# Patient Record
Sex: Female | Born: 1935 | Race: White | Hispanic: No | Marital: Married | Smoking: Former smoker
Health system: Southern US, Community
[De-identification: ages and names within clinical notes are randomized; demographics above are authoritative.]

## PROBLEM LIST (undated history)

## (undated) DIAGNOSIS — R0602 Shortness of breath: Secondary | ICD-10-CM

## (undated) DIAGNOSIS — J189 Pneumonia, unspecified organism: Secondary | ICD-10-CM

## (undated) DIAGNOSIS — C50919 Malignant neoplasm of unspecified site of unspecified female breast: Secondary | ICD-10-CM

## (undated) DIAGNOSIS — K621 Rectal polyp: Secondary | ICD-10-CM

## (undated) DIAGNOSIS — F1021 Alcohol dependence, in remission: Secondary | ICD-10-CM

## (undated) DIAGNOSIS — F39 Unspecified mood [affective] disorder: Secondary | ICD-10-CM

## (undated) DIAGNOSIS — D649 Anemia, unspecified: Secondary | ICD-10-CM

## (undated) DIAGNOSIS — K219 Gastro-esophageal reflux disease without esophagitis: Secondary | ICD-10-CM

## (undated) DIAGNOSIS — G459 Transient cerebral ischemic attack, unspecified: Secondary | ICD-10-CM

## (undated) DIAGNOSIS — K5790 Diverticulosis of intestine, part unspecified, without perforation or abscess without bleeding: Secondary | ICD-10-CM

## (undated) DIAGNOSIS — J449 Chronic obstructive pulmonary disease, unspecified: Secondary | ICD-10-CM

## (undated) DIAGNOSIS — I10 Essential (primary) hypertension: Secondary | ICD-10-CM

## (undated) DIAGNOSIS — M1990 Unspecified osteoarthritis, unspecified site: Secondary | ICD-10-CM

## (undated) DIAGNOSIS — E785 Hyperlipidemia, unspecified: Secondary | ICD-10-CM

## (undated) DIAGNOSIS — R7309 Other abnormal glucose: Secondary | ICD-10-CM

## (undated) HISTORY — DX: Anemia, unspecified: D64.9

## (undated) HISTORY — PX: COLONOSCOPY W/ POLYPECTOMY: SHX1380

## (undated) HISTORY — DX: Rectal polyp: K62.1

## (undated) HISTORY — DX: Diverticulosis of intestine, part unspecified, without perforation or abscess without bleeding: K57.90

## (undated) HISTORY — DX: Gastro-esophageal reflux disease without esophagitis: K21.9

## (undated) HISTORY — DX: Essential (primary) hypertension: I10

## (undated) HISTORY — DX: Alcohol dependence, in remission: F10.21

## (undated) HISTORY — PX: TONSILLECTOMY: SUR1361

## (undated) HISTORY — PX: BREAST BIOPSY: SHX20

## (undated) HISTORY — DX: Unspecified mood (affective) disorder: F39

## (undated) HISTORY — PX: HERNIA REPAIR: SHX51

## (undated) HISTORY — DX: Chronic obstructive pulmonary disease, unspecified: J44.9

## (undated) HISTORY — DX: Hyperlipidemia, unspecified: E78.5

## (undated) HISTORY — DX: Unspecified osteoarthritis, unspecified site: M19.90

## (undated) HISTORY — PX: CATARACT EXTRACTION W/ INTRAOCULAR LENS  IMPLANT, BILATERAL: SHX1307

## (undated) HISTORY — PX: BREAST EXCISIONAL BIOPSY: SUR124

## (undated) HISTORY — PX: CARPAL TUNNEL RELEASE: SHX101

## (undated) HISTORY — DX: Other abnormal glucose: R73.09

---

## 1976-07-10 HISTORY — PX: ABDOMINAL HYSTERECTOMY: SHX81

## 2000-07-10 HISTORY — PX: APPENDECTOMY: SHX54

## 2001-07-10 HISTORY — PX: BREAST EXCISIONAL BIOPSY: SUR124

## 2004-01-28 ENCOUNTER — Encounter: Payer: Self-pay | Admitting: Internal Medicine

## 2004-07-25 ENCOUNTER — Ambulatory Visit: Payer: Self-pay | Admitting: Internal Medicine

## 2004-09-28 ENCOUNTER — Ambulatory Visit (HOSPITAL_COMMUNITY): Admission: RE | Admit: 2004-09-28 | Discharge: 2004-09-28 | Payer: Self-pay | Admitting: Internal Medicine

## 2004-10-24 ENCOUNTER — Ambulatory Visit: Payer: Self-pay | Admitting: Internal Medicine

## 2004-10-31 ENCOUNTER — Ambulatory Visit: Payer: Self-pay | Admitting: Internal Medicine

## 2004-12-06 ENCOUNTER — Ambulatory Visit: Payer: Self-pay | Admitting: Internal Medicine

## 2005-03-14 ENCOUNTER — Ambulatory Visit: Payer: Self-pay | Admitting: Internal Medicine

## 2005-05-13 ENCOUNTER — Ambulatory Visit: Payer: Self-pay | Admitting: Internal Medicine

## 2005-05-24 ENCOUNTER — Ambulatory Visit: Payer: Self-pay | Admitting: Internal Medicine

## 2005-06-07 ENCOUNTER — Ambulatory Visit: Payer: Self-pay | Admitting: Internal Medicine

## 2005-06-24 ENCOUNTER — Ambulatory Visit: Payer: Self-pay | Admitting: Internal Medicine

## 2005-07-14 ENCOUNTER — Ambulatory Visit: Payer: Self-pay | Admitting: Internal Medicine

## 2005-07-20 ENCOUNTER — Ambulatory Visit: Payer: Self-pay | Admitting: Internal Medicine

## 2005-10-04 ENCOUNTER — Ambulatory Visit (HOSPITAL_COMMUNITY): Admission: RE | Admit: 2005-10-04 | Discharge: 2005-10-04 | Payer: Self-pay | Admitting: Internal Medicine

## 2005-11-17 ENCOUNTER — Ambulatory Visit: Payer: Self-pay | Admitting: Internal Medicine

## 2005-12-06 ENCOUNTER — Ambulatory Visit: Payer: Self-pay | Admitting: Internal Medicine

## 2006-01-01 ENCOUNTER — Ambulatory Visit: Payer: Self-pay | Admitting: Internal Medicine

## 2006-05-03 ENCOUNTER — Ambulatory Visit: Payer: Self-pay | Admitting: Internal Medicine

## 2006-07-10 HISTORY — PX: HAMMER TOE SURGERY: SHX385

## 2006-08-10 LAB — HM COLONOSCOPY

## 2006-10-10 ENCOUNTER — Ambulatory Visit (HOSPITAL_COMMUNITY): Admission: RE | Admit: 2006-10-10 | Discharge: 2006-10-10 | Payer: Self-pay | Admitting: Internal Medicine

## 2006-11-02 ENCOUNTER — Ambulatory Visit: Payer: Self-pay | Admitting: Internal Medicine

## 2006-11-02 LAB — CONVERTED CEMR LAB
ALT: 24 units/L (ref 0–40)
AST: 28 units/L (ref 0–37)
Albumin: 3.8 g/dL (ref 3.5–5.2)
Alkaline Phosphatase: 70 units/L (ref 39–117)
BUN: 15 mg/dL (ref 6–23)
Basophils Absolute: 0 10*3/uL (ref 0.0–0.1)
Basophils Relative: 0.6 % (ref 0.0–1.0)
Bilirubin, Direct: 0.1 mg/dL (ref 0.0–0.3)
CO2: 33 meq/L — ABNORMAL HIGH (ref 19–32)
Calcium: 9.3 mg/dL (ref 8.4–10.5)
Chloride: 105 meq/L (ref 96–112)
Cholesterol: 186 mg/dL (ref 0–200)
Creatinine, Ser: 0.7 mg/dL (ref 0.4–1.2)
Direct LDL: 101 mg/dL
Eosinophils Absolute: 0.2 10*3/uL (ref 0.0–0.6)
Eosinophils Relative: 3.6 % (ref 0.0–5.0)
GFR calc Af Amer: 107 mL/min
GFR calc non Af Amer: 88 mL/min
Glucose, Bld: 127 mg/dL — ABNORMAL HIGH (ref 70–99)
HCT: 39.5 % (ref 36.0–46.0)
HDL: 46.5 mg/dL (ref >39.0)
Hemoglobin: 13.6 g/dL (ref 12.0–15.0)
Lymphocytes Relative: 24.4 % (ref 12.0–46.0)
MCHC: 34.4 g/dL (ref 30.0–36.0)
MCV: 90.2 fL (ref 78.0–100.0)
Monocytes Absolute: 0.7 10*3/uL (ref 0.2–0.7)
Monocytes Relative: 10.7 % (ref 3.0–11.0)
Neutro Abs: 4.3 10*3/uL (ref 1.4–7.7)
Neutrophils Relative %: 60.7 % (ref 43.0–77.0)
Platelets: 287 10*3/uL (ref 150–400)
Potassium: 3.8 meq/L (ref 3.5–5.1)
RBC: 4.38 M/uL (ref 3.87–5.11)
RDW: 13.1 % (ref 11.5–14.6)
Sodium: 144 meq/L (ref 135–145)
TSH: 1.06 microintl units/mL (ref 0.35–5.50)
Total Bilirubin: 0.8 mg/dL (ref 0.3–1.2)
Total CHOL/HDL Ratio: 4
Total Protein: 6.4 g/dL (ref 6.0–8.3)
Triglycerides: 260 mg/dL (ref 0–149)
VLDL: 52 mg/dL — ABNORMAL HIGH (ref 0–40)
WBC: 6.9 10*3/uL (ref 4.5–10.5)

## 2006-11-07 ENCOUNTER — Encounter: Admission: RE | Admit: 2006-11-07 | Discharge: 2006-11-07 | Payer: Self-pay | Admitting: Internal Medicine

## 2006-12-14 ENCOUNTER — Ambulatory Visit: Payer: Self-pay | Admitting: Internal Medicine

## 2006-12-17 DIAGNOSIS — J441 Chronic obstructive pulmonary disease with (acute) exacerbation: Secondary | ICD-10-CM | POA: Insufficient documentation

## 2006-12-17 DIAGNOSIS — J449 Chronic obstructive pulmonary disease, unspecified: Secondary | ICD-10-CM

## 2006-12-17 DIAGNOSIS — I10 Essential (primary) hypertension: Secondary | ICD-10-CM | POA: Insufficient documentation

## 2006-12-17 DIAGNOSIS — E785 Hyperlipidemia, unspecified: Secondary | ICD-10-CM

## 2006-12-17 DIAGNOSIS — J4489 Other specified chronic obstructive pulmonary disease: Secondary | ICD-10-CM

## 2006-12-17 HISTORY — DX: Hyperlipidemia, unspecified: E78.5

## 2006-12-17 HISTORY — DX: Essential (primary) hypertension: I10

## 2006-12-17 HISTORY — DX: Other specified chronic obstructive pulmonary disease: J44.89

## 2006-12-17 HISTORY — DX: Chronic obstructive pulmonary disease, unspecified: J44.9

## 2007-02-26 ENCOUNTER — Telehealth (INDEPENDENT_AMBULATORY_CARE_PROVIDER_SITE_OTHER): Payer: Self-pay | Admitting: *Deleted

## 2007-03-06 ENCOUNTER — Ambulatory Visit: Payer: Self-pay | Admitting: Internal Medicine

## 2007-03-13 ENCOUNTER — Ambulatory Visit: Payer: Self-pay | Admitting: Internal Medicine

## 2007-03-27 ENCOUNTER — Encounter: Payer: Self-pay | Admitting: Internal Medicine

## 2007-03-27 ENCOUNTER — Ambulatory Visit: Payer: Self-pay | Admitting: Internal Medicine

## 2007-03-27 DIAGNOSIS — K5790 Diverticulosis of intestine, part unspecified, without perforation or abscess without bleeding: Secondary | ICD-10-CM

## 2007-03-27 DIAGNOSIS — K621 Rectal polyp: Secondary | ICD-10-CM

## 2007-03-27 HISTORY — DX: Diverticulosis of intestine, part unspecified, without perforation or abscess without bleeding: K57.90

## 2007-03-27 HISTORY — DX: Rectal polyp: K62.1

## 2007-03-29 ENCOUNTER — Telehealth: Payer: Self-pay | Admitting: *Deleted

## 2007-05-01 ENCOUNTER — Ambulatory Visit: Payer: Self-pay | Admitting: Internal Medicine

## 2007-05-01 DIAGNOSIS — K219 Gastro-esophageal reflux disease without esophagitis: Secondary | ICD-10-CM

## 2007-05-01 HISTORY — DX: Gastro-esophageal reflux disease without esophagitis: K21.9

## 2007-05-02 LAB — CONVERTED CEMR LAB
ALT: 24 units/L (ref 0–35)
AST: 23 units/L (ref 0–37)
Albumin: 3.6 g/dL (ref 3.5–5.2)
Alkaline Phosphatase: 78 units/L (ref 39–117)
BUN: 14 mg/dL (ref 6–23)
Bilirubin, Direct: 0.1 mg/dL (ref 0.0–0.3)
CO2: 28 meq/L (ref 19–32)
Calcium: 9.3 mg/dL (ref 8.4–10.5)
Chloride: 106 meq/L (ref 96–112)
Cholesterol: 191 mg/dL (ref 0–200)
Creatinine, Ser: 0.6 mg/dL (ref 0.4–1.2)
Direct LDL: 107.7 mg/dL
GFR calc Af Amer: 127 mL/min
GFR calc non Af Amer: 105 mL/min
Glucose, Bld: 127 mg/dL — ABNORMAL HIGH (ref 70–99)
HDL: 41.9 mg/dL (ref >39.0)
Potassium: 3.6 meq/L (ref 3.5–5.1)
Sodium: 143 meq/L (ref 135–145)
Total Bilirubin: 0.6 mg/dL (ref 0.3–1.2)
Total CHOL/HDL Ratio: 4.6
Total Protein: 5.9 g/dL — ABNORMAL LOW (ref 6.0–8.3)
Triglycerides: 240 mg/dL (ref 0–149)
VLDL: 48 mg/dL — ABNORMAL HIGH (ref 0–40)

## 2007-05-04 ENCOUNTER — Encounter: Admission: RE | Admit: 2007-05-04 | Discharge: 2007-05-04 | Payer: Self-pay | Admitting: Internal Medicine

## 2007-05-07 ENCOUNTER — Ambulatory Visit: Payer: Self-pay | Admitting: Internal Medicine

## 2007-05-15 ENCOUNTER — Ambulatory Visit: Payer: Self-pay | Admitting: Pulmonary Disease

## 2007-05-15 ENCOUNTER — Encounter: Payer: Self-pay | Admitting: Internal Medicine

## 2007-05-30 ENCOUNTER — Ambulatory Visit: Payer: Self-pay | Admitting: Internal Medicine

## 2007-06-04 ENCOUNTER — Telehealth: Payer: Self-pay | Admitting: Internal Medicine

## 2007-06-25 ENCOUNTER — Telehealth: Payer: Self-pay | Admitting: Internal Medicine

## 2007-07-12 ENCOUNTER — Ambulatory Visit: Payer: Self-pay | Admitting: Family Medicine

## 2007-08-27 ENCOUNTER — Encounter: Payer: Self-pay | Admitting: Internal Medicine

## 2007-08-27 ENCOUNTER — Ambulatory Visit: Payer: Self-pay | Admitting: Internal Medicine

## 2007-08-27 DIAGNOSIS — F1021 Alcohol dependence, in remission: Secondary | ICD-10-CM

## 2007-08-27 DIAGNOSIS — R7309 Other abnormal glucose: Secondary | ICD-10-CM | POA: Insufficient documentation

## 2007-08-27 HISTORY — DX: Alcohol dependence, in remission: F10.21

## 2007-08-27 HISTORY — DX: Other abnormal glucose: R73.09

## 2007-08-29 LAB — CONVERTED CEMR LAB
BUN: 11 mg/dL (ref 6–23)
Calcium: 9.5 mg/dL (ref 8.4–10.5)
Creatinine, Ser: 0.8 mg/dL (ref 0.4–1.2)
GFR calc non Af Amer: 75 mL/min
Glucose, Bld: 105 mg/dL — ABNORMAL HIGH (ref 70–99)
Hgb A1c MFr Bld: 6.2 % — ABNORMAL HIGH (ref 4.6–6.0)

## 2007-10-11 ENCOUNTER — Ambulatory Visit (HOSPITAL_COMMUNITY): Admission: RE | Admit: 2007-10-11 | Discharge: 2007-10-11 | Payer: Self-pay | Admitting: Internal Medicine

## 2007-11-20 ENCOUNTER — Ambulatory Visit: Payer: Self-pay | Admitting: Internal Medicine

## 2008-01-06 ENCOUNTER — Ambulatory Visit: Payer: Self-pay | Admitting: Internal Medicine

## 2008-02-19 ENCOUNTER — Ambulatory Visit: Payer: Self-pay | Admitting: Internal Medicine

## 2008-02-19 LAB — CONVERTED CEMR LAB
Albumin: 3.7 g/dL (ref 3.5–5.2)
Calcium: 9.2 mg/dL (ref 8.4–10.5)
Chloride: 105 meq/L (ref 96–112)
Cholesterol: 190 mg/dL (ref 0–200)
GFR calc Af Amer: 106 mL/min
Glucose, Bld: 87 mg/dL (ref 70–99)
Potassium: 4.2 meq/L (ref 3.5–5.1)
Sodium: 144 meq/L (ref 135–145)
Total Bilirubin: 0.7 mg/dL (ref 0.3–1.2)
Total CHOL/HDL Ratio: 4.5
Total Protein: 6.1 g/dL (ref 6.0–8.3)
VLDL: 34 mg/dL (ref 0–40)

## 2008-02-26 ENCOUNTER — Ambulatory Visit: Payer: Self-pay | Admitting: Internal Medicine

## 2008-04-06 ENCOUNTER — Ambulatory Visit: Payer: Self-pay | Admitting: Internal Medicine

## 2008-06-16 ENCOUNTER — Ambulatory Visit: Payer: Self-pay | Admitting: Internal Medicine

## 2008-06-16 LAB — CONVERTED CEMR LAB
ALT: 21 units/L (ref 0–35)
AST: 19 units/L (ref 0–37)
Albumin: 3.5 g/dL (ref 3.5–5.2)
Alkaline Phosphatase: 65 units/L (ref 39–117)
BUN: 16 mg/dL (ref 6–23)
Bilirubin, Direct: 0.1 mg/dL (ref 0.0–0.3)
CO2: 29 meq/L (ref 19–32)
Calcium: 9 mg/dL (ref 8.4–10.5)
Cholesterol: 198 mg/dL (ref 0–200)
Creatinine, Ser: 0.7 mg/dL (ref 0.4–1.2)
GFR calc non Af Amer: 88 mL/min
HDL: 43.8 mg/dL (ref >39.0)
Potassium: 3.8 meq/L (ref 3.5–5.1)
Sodium: 141 meq/L (ref 135–145)
Total Bilirubin: 0.6 mg/dL (ref 0.3–1.2)

## 2008-06-23 ENCOUNTER — Ambulatory Visit: Payer: Self-pay | Admitting: Internal Medicine

## 2008-07-14 ENCOUNTER — Telehealth: Payer: Self-pay | Admitting: Internal Medicine

## 2008-10-12 ENCOUNTER — Ambulatory Visit (HOSPITAL_COMMUNITY): Admission: RE | Admit: 2008-10-12 | Discharge: 2008-10-12 | Payer: Self-pay | Admitting: Internal Medicine

## 2008-10-19 ENCOUNTER — Ambulatory Visit: Payer: Self-pay | Admitting: Internal Medicine

## 2008-10-19 LAB — CONVERTED CEMR LAB
ALT: 29 units/L (ref 0–35)
Albumin: 3.6 g/dL (ref 3.5–5.2)
BUN: 18 mg/dL (ref 6–23)
Bilirubin, Direct: 0.1 mg/dL (ref 0.0–0.3)
Calcium: 9.2 mg/dL (ref 8.4–10.5)
Creatinine, Ser: 0.8 mg/dL (ref 0.4–1.2)
Hgb A1c MFr Bld: 6.1 % (ref 4.6–6.5)
LDL Cholesterol: 109 mg/dL — ABNORMAL HIGH (ref 0–99)
Total Bilirubin: 0.7 mg/dL (ref 0.3–1.2)
Total CHOL/HDL Ratio: 4
Total Protein: 6.3 g/dL (ref 6.0–8.3)
VLDL: 37.6 mg/dL (ref 0.0–40.0)

## 2008-10-26 ENCOUNTER — Ambulatory Visit: Payer: Self-pay | Admitting: Internal Medicine

## 2009-02-01 ENCOUNTER — Ambulatory Visit: Payer: Self-pay | Admitting: Internal Medicine

## 2009-02-02 LAB — CONVERTED CEMR LAB
ALT: 26 units/L (ref 0–35)
AST: 28 units/L (ref 0–37)
Albumin: 3.7 g/dL (ref 3.5–5.2)
Alkaline Phosphatase: 59 units/L (ref 39–117)
BUN: 18 mg/dL (ref 6–23)
Bilirubin, Direct: 0 mg/dL (ref 0.0–0.3)
CO2: 30 meq/L (ref 19–32)
Calcium: 9.7 mg/dL (ref 8.4–10.5)
Chloride: 104 meq/L (ref 96–112)
Cholesterol: 198 mg/dL (ref 0–200)
Creatinine, Ser: 0.8 mg/dL (ref 0.4–1.2)
GFR calc non Af Amer: 74.94 mL/min (ref >60)
Glucose, Bld: 119 mg/dL — ABNORMAL HIGH (ref 70–99)
HDL: 44.7 mg/dL (ref >39.00)
Hgb A1c MFr Bld: 6 % (ref 4.6–6.5)
LDL Cholesterol: 123 mg/dL — ABNORMAL HIGH (ref 0–99)
Potassium: 3.6 meq/L (ref 3.5–5.1)
Sodium: 143 meq/L (ref 135–145)
TSH: 1.78 microintl units/mL (ref 0.35–5.50)
Total Bilirubin: 0.9 mg/dL (ref 0.3–1.2)
Total CHOL/HDL Ratio: 4
Total Protein: 6.5 g/dL (ref 6.0–8.3)
Triglycerides: 153 mg/dL — ABNORMAL HIGH (ref 0.0–149.0)
VLDL: 30.6 mg/dL (ref 0.0–40.0)

## 2009-04-21 ENCOUNTER — Ambulatory Visit: Payer: Self-pay | Admitting: Internal Medicine

## 2009-05-26 ENCOUNTER — Ambulatory Visit: Payer: Self-pay | Admitting: Internal Medicine

## 2009-06-09 ENCOUNTER — Telehealth: Payer: Self-pay | Admitting: Internal Medicine

## 2009-06-23 ENCOUNTER — Encounter: Admission: RE | Admit: 2009-06-23 | Discharge: 2009-07-08 | Payer: Self-pay | Admitting: Orthopedic Surgery

## 2009-07-12 ENCOUNTER — Encounter: Admission: RE | Admit: 2009-07-12 | Discharge: 2009-07-22 | Payer: Self-pay | Admitting: Orthopedic Surgery

## 2009-07-26 ENCOUNTER — Ambulatory Visit: Payer: Self-pay | Admitting: Internal Medicine

## 2009-08-10 ENCOUNTER — Telehealth: Payer: Self-pay | Admitting: Internal Medicine

## 2009-08-16 ENCOUNTER — Encounter: Payer: Self-pay | Admitting: Internal Medicine

## 2009-08-19 ENCOUNTER — Ambulatory Visit: Payer: Self-pay | Admitting: Internal Medicine

## 2009-08-19 LAB — CONVERTED CEMR LAB
AST: 28 units/L (ref 0–37)
Albumin: 3.8 g/dL (ref 3.5–5.2)
BUN: 22 mg/dL (ref 6–23)
Bilirubin, Direct: 0.1 mg/dL (ref 0.0–0.3)
Chloride: 106 meq/L (ref 96–112)
Hgb A1c MFr Bld: 6.2 % (ref 4.6–6.5)
Potassium: 3.7 meq/L (ref 3.5–5.1)
Sodium: 142 meq/L (ref 135–145)
Triglycerides: 202 mg/dL — ABNORMAL HIGH (ref 0.0–149.0)
VLDL: 40.4 mg/dL — ABNORMAL HIGH (ref 0.0–40.0)

## 2009-08-26 ENCOUNTER — Ambulatory Visit: Payer: Self-pay | Admitting: Internal Medicine

## 2009-09-02 ENCOUNTER — Encounter: Payer: Self-pay | Admitting: Internal Medicine

## 2009-10-13 ENCOUNTER — Ambulatory Visit (HOSPITAL_COMMUNITY): Admission: RE | Admit: 2009-10-13 | Discharge: 2009-10-13 | Payer: Self-pay | Admitting: Internal Medicine

## 2009-10-13 LAB — HM MAMMOGRAPHY: HM Mammogram: NORMAL

## 2010-02-16 ENCOUNTER — Ambulatory Visit: Payer: Self-pay | Admitting: Internal Medicine

## 2010-02-16 LAB — CONVERTED CEMR LAB
AST: 24 units/L (ref 0–37)
Basophils Absolute: 0.1 10*3/uL (ref 0.0–0.1)
Basophils Relative: 0.7 % (ref 0.0–3.0)
Bilirubin, Direct: 0.1 mg/dL (ref 0.0–0.3)
CO2: 34 meq/L — ABNORMAL HIGH (ref 19–32)
Calcium: 10 mg/dL (ref 8.4–10.5)
Cholesterol: 187 mg/dL (ref 0–200)
Eosinophils Relative: 4.6 % (ref 0.0–5.0)
Glucose, Bld: 90 mg/dL (ref 70–99)
HDL: 41.6 mg/dL (ref >39.00)
Lymphs Abs: 2.5 10*3/uL (ref 0.7–4.0)
MCV: 92.8 fL (ref 78.0–100.0)
Monocytes Absolute: 0.7 10*3/uL (ref 0.1–1.0)
Potassium: 4.2 meq/L (ref 3.5–5.1)
RBC: 4.19 M/uL (ref 3.87–5.11)
RDW: 14.1 % (ref 11.5–14.6)
Sodium: 144 meq/L (ref 135–145)
Triglycerides: 248 mg/dL — ABNORMAL HIGH (ref 0.0–149.0)
VLDL: 49.6 mg/dL — ABNORMAL HIGH (ref 0.0–40.0)

## 2010-02-23 ENCOUNTER — Ambulatory Visit: Payer: Self-pay | Admitting: Internal Medicine

## 2010-02-23 DIAGNOSIS — M129 Arthropathy, unspecified: Secondary | ICD-10-CM | POA: Insufficient documentation

## 2010-02-24 ENCOUNTER — Ambulatory Visit: Payer: Self-pay | Admitting: Internal Medicine

## 2010-02-24 ENCOUNTER — Encounter: Payer: Self-pay | Admitting: Internal Medicine

## 2010-03-24 ENCOUNTER — Ambulatory Visit: Payer: Self-pay | Admitting: Internal Medicine

## 2010-07-15 ENCOUNTER — Telehealth: Payer: Self-pay | Admitting: Internal Medicine

## 2010-07-26 ENCOUNTER — Encounter: Payer: Self-pay | Admitting: Internal Medicine

## 2010-08-06 ENCOUNTER — Encounter: Payer: Self-pay | Admitting: *Deleted

## 2010-08-06 ENCOUNTER — Encounter: Payer: Self-pay | Admitting: Internal Medicine

## 2010-08-09 NOTE — Op Note (Signed)
Summary: Cataract Removal Left Eye  Cataract Removal Left Eye   Imported By: Maryln Gottron 09/06/2009 09:07:55  _____________________________________________________________________  External Attachment:    Type:   Image     Comment:   External Document

## 2010-08-09 NOTE — Assessment & Plan Note (Signed)
Summary: cough/chest congestion/cjr/rsc/cjr   Vital Signs:  Patient profile:   76 year old female Temp:     97.9 degrees F Pulse rate:   60 / minute Resp:     12 per minute BP sitting:   118 / 74  (left arm)  Vitals Entered By: Gladis Riffle, RN (July 26, 2009 8:43 AM) CC: URI symptoms   CC:  URI symptoms.  History of Present Illness:  URI Symptoms      This is a 76 year old woman who presents with URI symptoms.  The patient denies nasal congestion and clear nasal discharge.  The patient denies fever.  The patient also reports itchy watery eyes and itchy throat.  The patient denies the following risk factors for Strep sinusitis: unilateral facial pain.  2 weeks of sxs---sinus congestion--has some cough--mostly nonproductive  All other systems reviewed and were negative   Preventive Screening-Counseling & Management  Alcohol-Tobacco     Smoking Status: quit > 6 months     Year Started: 1956     Year Quit: 1986  Current Problems (verified): 1)  Hyperglycemia  (ICD-790.29) 2)  Acute Alcoholic Intoxication in Remission  (ICD-303.03) 3)  Gerd  (ICD-530.81) 4)  Depression  (ICD-311) 5)  Anxiety  (ICD-300.00) 6)  Hypertension  (ICD-401.9) 7)  Hyperlipidemia  (ICD-272.4) 8)  COPD  (ICD-496)  Current Medications (verified): 1)  Diovan Hct 320-25 Mg Tabs (Valsartan-Hydrochlorothiazide) .... Take 1  Tablet By Mouth Once A Day 2)  Simvastatin 20 Mg Tabs (Simvastatin) .... Take 1 Tablet By Mouth Once A Day 3)  Multivitamins   Caps (Multiple Vitamin) .... One By Mouth Daily 4)  Caltrate 600+d 600-400 Mg-Unit  Tabs (Calcium Carbonate-Vitamin D) .... Daily 5)  Diclofenac Sodium 50 Mg  Tbec (Diclofenac Sodium) .... Take 1 Tablet By Mouth Two Times A Day 6)  Misoprostol 200 Mcg  Tabs (Misoprostol) .... One By Mouth Bid 7)  Vitamin D 1000 Unit Caps (Cholecalciferol) .... Once Daily  Allergies (verified): No Known Drug Allergies  Comments:  Nurse/Medical Assistant: c/o cough and  chest congestion x 2-3 weeks, had it over Thanksgiving and got better but has returned--has tried robitussin DM with no relief  The patient's medications and allergies were reviewed with the patient and were updated in the Medication and Allergy Lists. Gladis Riffle, RN (July 26, 2009 8:45 AM)  Past History:  Past Medical History: Last updated: 05/01/2007 COPD/chronic bronchitis Hyperlipidemia Hypertension mood disorder recovering alcoholic Anxiety Depression GERD  Past Surgical History: Last updated: 12/17/2006 breast biopsy-neg Appendectomy--2002 Hysterectomy--fibroma--1978 Hammer toe L foot--2008  Family History: Last updated: 05/26/2009 mother throat cancer father MI 97 yo brother sarcoma brother---IPF (62 yo)  Social History: Last updated: 03/06/2007 Married Former Smoker Alcohol use-no Regular exercise-no  Risk Factors: Exercise: no (03/06/2007)  Risk Factors: Smoking Status: quit > 6 months (07/26/2009)  Review of Systems       All other systems reviewed and were negative   Physical Exam  General:  Well-developed,well-nourished,in no acute distress; alert,appropriate and cooperative throughout examination Head:  normocephalic and atraumatic.   Eyes:  pupils equal and pupils round.   Ears:  R ear normal and L ear normal.   Nose:  no external deformity and no external erythema.   Neck:  No deformities, masses, or tenderness noted. Chest Wall:  No deformities, masses, or tenderness noted. Lungs:  normal respiratory effort and no intercostal retractions.     Impression & Recommendations:  Problem # 1:  BRONCHITIS-ACUTE (ICD-466.0)  emperic abx side effects discussed robitussin dm  Her updated medication list for this problem includes:    Doxycycline Hyclate 100 Mg Caps (Doxycycline hyclate) .Marland Kitchen... Take 1 tab twice a day  Complete Medication List: 1)  Diovan Hct 320-25 Mg Tabs (Valsartan-hydrochlorothiazide) .... Take 1  tablet by mouth once a  day 2)  Simvastatin 20 Mg Tabs (Simvastatin) .... Take 1 tablet by mouth once a day 3)  Multivitamins Caps (Multiple vitamin) .... One by mouth daily 4)  Caltrate 600+d 600-400 Mg-unit Tabs (Calcium carbonate-vitamin d) .... Daily 5)  Diclofenac Sodium 50 Mg Tbec (Diclofenac sodium) .... Take 1 tablet by mouth two times a day 6)  Misoprostol 200 Mcg Tabs (Misoprostol) .... One by mouth bid 7)  Vitamin D 1000 Unit Caps (Cholecalciferol) .... Once daily 8)  Doxycycline Hyclate 100 Mg Caps (Doxycycline hyclate) .... Take 1 tab twice a day Prescriptions: DOXYCYCLINE HYCLATE 100 MG CAPS (DOXYCYCLINE HYCLATE) Take 1 tab twice a day  #20 x 0   Entered and Authorized by:   Birdie Sons MD   Signed by:   Birdie Sons MD on 07/26/2009   Method used:   Electronically to        [facility] [phone]* (retail)       [address]       [place], [place]  [address]       Ph: [phone] or [phone]       Fax: [phone]   RxID:   [id number]

## 2010-08-09 NOTE — Assessment & Plan Note (Signed)
Summary: flu shot//ccm   Nurse Visit   Review of Systems       Flu Vaccine Consent Questions     Do you have a history of severe allergic reactions to this vaccine? no    Any prior history of allergic reactions to egg and/or gelatin? no    Do you have a sensitivity to the preservative Thimersol? no    Do you have a past history of Guillan-Barre Syndrome? no    Do you currently have an acute febrile illness? no    Have you ever had a severe reaction to latex? no    Vaccine information given and explained to patient? yes    Are you currently pregnant? no    Lot Number:[id number]   Exp Date:01/07/2011   Site Given  Left Deltoid IM Josph Macho RMA  March 24, 2010 2:16 PM    Allergies: No Known Drug Allergies  Orders Added: 1)  Flu Vaccine 68yrs + MEDICARE PATIENTS [Q2039] 2)  Administration Flu vaccine - MCR [G0008]

## 2010-08-09 NOTE — Therapy (Signed)
Summary: Audiological Evaluation/[facility]   Audiological Evaluation/[facility]   Imported By: Maryln Gottron 09/08/2009 14:00:43  _____________________________________________________________________  External Attachment:    Type:   Image     Comment:   External Document

## 2010-08-09 NOTE — Miscellaneous (Signed)
Summary: BONE DENSITY  Clinical Lists Changes  Orders: Added new Test order of T-Bone Densitometry (77080) - Signed Added new Test order of T-Lumbar Vertebral Assessment (77082) - Signed 

## 2010-08-09 NOTE — Assessment & Plan Note (Signed)
Summary: 6 month rov/njr   Vital Signs:  Patient profile:   76 year old female Menstrual status:  hysterectomy Height:      65.5 inches Weight:      203 pounds Temp:     97.9 degrees F oral Pulse rate:   68 / minute Pulse rhythm:   regular Resp:     12 per minute BP sitting:   152 / 78  (left arm) Cuff size:   regular  Vitals Entered By: Gladis Riffle, RN (February 23, 2010 7:55 AM)  Serial Vital Signs/Assessments:  Time      Position  BP       Pulse  Resp  Temp     By                     130/74                         Birdie Sons MD  CC: 6 month rov, labs done Is Patient Diabetic? No   CC:  6 month rov and labs done.  History of Present Illness:  Follow-Up Visit      This is a 76 year old woman who presents for Follow-up visit.  The patient denies chest pain and palpitations.  Since the last visit the patient notes no new problems or concerns.  The patient reports taking meds as prescribed and monitoring BP.  When questioned about possible medication side effects, the patient notes none.    home bps 120-130/60s  All other systems reviewed and were negative   Preventive Screening-Counseling & Management  Alcohol-Tobacco     Smoking Status: quit > 6 months     Year Started: 1954     Year Quit: 1986  Current Problems (verified): 1)  Hyperglycemia  (ICD-790.29) 2)  Acute Alcoholic Intoxication in Remission  (ICD-303.03) 3)  Gerd  (ICD-530.81) 4)  Depression  (ICD-311) 5)  Anxiety  (ICD-300.00) 6)  Hypertension  (ICD-401.9) 7)  Hyperlipidemia  (ICD-272.4) 8)  COPD  (ICD-496)  Current Medications (verified): 1)  Losartan Potassium-Hctz 100-25 Mg Tabs (Losartan Potassium-Hctz) .... Take 1 Tablet By Mouth Once A Day 2)  Simvastatin 20 Mg Tabs (Simvastatin) .... Take 1 Tablet By Mouth Once A Day 3)  Multivitamins   Caps (Multiple Vitamin) .... One By Mouth Daily 4)  Caltrate 600+d 600-400 Mg-Unit  Tabs (Calcium Carbonate-Vitamin D) .... Daily 5)  Diclofenac Sodium 50  Mg  Tbec (Diclofenac Sodium) .... Take 1 Tablet By Mouth Two Times A Day 6)  Misoprostol 200 Mcg  Tabs (Misoprostol) .... One By Mouth Bid 7)  Vitamin D 1000 Unit Caps (Cholecalciferol) .... Once Daily 8)  Claritin 10 Mg Tabs (Loratadine) .... Take 1 Tablet By Mouth Once A Day  Allergies (verified): No Known Drug Allergies  Past History:  Past Medical History: Last updated: 05/01/2007 COPD/chronic bronchitis Hyperlipidemia Hypertension mood disorder recovering alcoholic Anxiety Depression GERD  Past Surgical History: Last updated: 12/17/2006 breast biopsy-neg Appendectomy--2002 Hysterectomy--fibroma--1978 Hammer toe L foot--2008  Family History: Last updated: 05/26/2009 mother throat cancer father MI 82 yo brother sarcoma brother---IPF (30 yo)  Social History: Last updated: 03/06/2007 Married Former Smoker Alcohol use-no Regular exercise-no  Risk Factors: Exercise: no (03/06/2007)  Risk Factors: Smoking Status: quit > 6 months (02/23/2010)  Physical Exam  General:  Well-developed,well-nourished,in no acute distress; alert,appropriate and cooperative throughout examination Head:  normocephalic and atraumatic.   Eyes:  pupils equal and pupils round.  Ears:  R ear normal and L ear normal.   Neck:  No deformities, masses, or tenderness noted. Chest Wall:  No deformities, masses, or tenderness noted. Lungs:  normal respiratory effort and no intercostal retractions.   Heart:  normal rate and regular rhythm.   Abdomen:  Bowel sounds positive,abdomen soft and non-tender without masses, organomegaly or hernias noted.  overweight Msk:  No deformity or scoliosis noted of thoracic or lumbar spine.   Neurologic:  cranial nerves II-XII intact and gait normal.     Impression & Recommendations:  Problem # 1:  GERD (ICD-530.81) rarely has sxs no new meds Her updated medication list for this problem includes:    Misoprostol 200 Mcg Tabs (Misoprostol) ..... One by  mouth bid  Problem # 2:  HYPERTENSION (ICD-401.9) note serial assessment and home bps continue current medications  Her updated medication list for this problem includes:    Losartan Potassium-hctz 100-25 Mg Tabs (Losartan potassium-hctz) .Marland Kitchen... Take 1 tablet by mouth once a day  BP today: 152/78 Prior BP: 130/82 (08/26/2009)  Labs Reviewed: K+: 4.2 (02/16/2010) Creat: : 0.8 (02/16/2010)   Chol: 187 (02/16/2010)   HDL: 41.60 (02/16/2010)   LDL: 123 (02/01/2009)   TG: 248.0 (02/16/2010)  Problem # 3:  ARTHRITIS (ICD-716.90) tolerating NSAID  Problem # 4:  HYPERGLYCEMIA (ICD-790.29) see labs reviewed  Problem # 5:  HYPERLIPIDEMIA (ICD-272.4) controlled continue current medications  Her updated medication list for this problem includes:    Simvastatin 20 Mg Tabs (Simvastatin) .Marland Kitchen... Take 1 tablet by mouth once a day  Labs Reviewed: SGOT: 24 (02/16/2010)   SGPT: 24 (02/16/2010)   HDL:41.60 (02/16/2010), 50.40 (08/19/2009)  LDL:123 (02/01/2009), 109 (10/19/2008)  Chol:187 (02/16/2010), 184 (08/19/2009)  Trig:248.0 (02/16/2010), 202.0 (08/19/2009)  Complete Medication List: 1)  Losartan Potassium-hctz 100-25 Mg Tabs (Losartan potassium-hctz) .... Take 1 tablet by mouth once a day 2)  Simvastatin 20 Mg Tabs (Simvastatin) .... Take 1 tablet by mouth once a day 3)  Multivitamins Caps (Multiple vitamin) .... One by mouth daily 4)  Caltrate 600+d 600-400 Mg-unit Tabs (Calcium carbonate-vitamin d) .... Daily 5)  Diclofenac Sodium 50 Mg Tbec (Diclofenac sodium) .... Take 1 tablet by mouth two times a day 6)  Misoprostol 200 Mcg Tabs (Misoprostol) .... One by mouth bid 7)  Vitamin D 1000 Unit Caps (Cholecalciferol) .... Once daily 8)  Claritin 10 Mg Tabs (Loratadine) .... Take 1 tablet by mouth once a day  Contraindications/Deferment of Procedures/Staging:    Test/Procedure: PAP Smear    Reason for deferment: not indicated   Preventive Care Screening  Mammogram:    Date:   10/13/2009    Next Due:  10/2010    Results:  normal    Patient Instructions: 1)  Please schedule a follow-up appointment in 6 months. 2)  SChedule DEXA Prescriptions: MISOPROSTOL 200 MCG  TABS (MISOPROSTOL) one by mouth bid  #180 x 3   Entered and Authorized by:   Birdie Sons MD   Signed by:   Birdie Sons MD on 02/23/2010   Method used:   Faxed to ...       [facility] (mail-order)             , [place]         Ph: [phone]       Fax: [phone]   RxID:   [id number] DICLOFENAC SODIUM 50 MG  TBEC (DICLOFENAC SODIUM) Take 1 tablet by mouth two times a day  #180 x 3   Entered and  Authorized by:   Birdie Sons MD   Signed by:   Birdie Sons MD on 02/23/2010   Method used:   Faxed to ...       [facility] (mail-order)             , [place]         Ph: [phone]       Fax: [phone]   RxID:   [id number] SIMVASTATIN 20 MG TABS (SIMVASTATIN) Take 1 tablet by mouth once a day  #90 x 3   Entered and Authorized by:   Birdie Sons MD   Signed by:   Birdie Sons MD on 02/23/2010   Method used:   Faxed to ...       [facility] (mail-order)             , [place]         Ph: [phone]       Fax: [phone]   RxID:   [id number] LOSARTAN POTASSIUM-HCTZ 100-25 MG TABS (LOSARTAN POTASSIUM-HCTZ) Take 1 tablet by mouth once a day  #90 x 3   Entered and Authorized by:   Birdie Sons MD   Signed by:   Birdie Sons MD on 02/23/2010   Method used:   Faxed to ...       [facility] (mail-order)             , [place]         Ph: [phone]       Fax: [phone]   RxID:   [id number]    Preventive Care Screening  Mammogram:    Date:  10/13/2009    Next Due:  10/2010    Results:  normal

## 2010-08-09 NOTE — Progress Notes (Signed)
Summary: refill simvastatin  Phone Note Refill Request Message from:  Fax from Pharmacy  Refills Requested: Medication #1:  SIMVASTATIN 20 MG TABS Take 1 tablet by mouth once a day [facility] fax----[phone]  Initial call taken by: Warnell Forester,  August 10, 2009 9:27 AM    Prescriptions: SIMVASTATIN 20 MG TABS (SIMVASTATIN) Take 1 tablet by mouth once a day  #90 x 3   Entered by:   Gladis Riffle, RN   Authorized by:   Birdie Sons MD   Signed by:   Gladis Riffle, RN on 08/10/2009   Method used:   Electronically to        [facility] (mail-order)             ,          Ph: [phone]       Fax: [phone]   RxID:   [id number]

## 2010-08-09 NOTE — Assessment & Plan Note (Signed)
Summary: 3 month rov/njr   Vital Signs:  Patient profile:   76 year old female Menstrual status:  hysterectomy Weight:      201 pounds BMI:     33.06 Temp:     97.7 degrees F oral Pulse rate:   118 / minute Pulse (ortho):   72 / minute Pulse rhythm:   l BP sitting:   130 / 82  (left arm) Cuff size:   regular  Vitals Entered By: Gladis Riffle, RN (August 26, 2009 8:28 AM) CC: 3 month rov, labs done Is Patient Diabetic? No Comments BP 111/69-152/71 at home     Menstrual Status hysterectomy   CC:  3 month rov and labs done.  History of Present Illness:  Follow-Up Visit      This is a 76 year old woman who presents for Follow-up visit.  The patient denies chest pain and palpitations.  Since the last visit the patient notes no new problems or concerns.  The patient reports taking meds as prescribed.  When questioned about possible medication side effects, the patient notes none.    continue current medications   Preventive Screening-Counseling & Management  Alcohol-Tobacco     Smoking Status: quit > 6 months     Year Started: 1954     Year Quit: 1986  Current Problems (verified): 1)  Hyperglycemia  (ICD-790.29) 2)  Acute Alcoholic Intoxication in Remission  (ICD-303.03) 3)  Gerd  (ICD-530.81) 4)  Depression  (ICD-311) 5)  Anxiety  (ICD-300.00) 6)  Hypertension  (ICD-401.9) 7)  Hyperlipidemia  (ICD-272.4) 8)  COPD  (ICD-496)  Medications Prior to Update: 1)  Diovan Hct 320-25 Mg Tabs (Valsartan-Hydrochlorothiazide) .... Take 1  Tablet By Mouth Once A Day 2)  Simvastatin 20 Mg Tabs (Simvastatin) .... Take 1 Tablet By Mouth Once A Day 3)  Multivitamins   Caps (Multiple Vitamin) .... One By Mouth Daily 4)  Caltrate 600+d 600-400 Mg-Unit  Tabs (Calcium Carbonate-Vitamin D) .... Daily 5)  Diclofenac Sodium 50 Mg  Tbec (Diclofenac Sodium) .... Take 1 Tablet By Mouth Two Times A Day 6)  Misoprostol 200 Mcg  Tabs (Misoprostol) .... One By Mouth Bid 7)  Vitamin D 1000 Unit  Caps (Cholecalciferol) .... Once Daily  Current Medications (verified): 1)  Diovan Hct 320-25 Mg Tabs (Valsartan-Hydrochlorothiazide) .... Take 1  Tablet By Mouth Once A Day 2)  Simvastatin 20 Mg Tabs (Simvastatin) .... Take 1 Tablet By Mouth Once A Day 3)  Multivitamins   Caps (Multiple Vitamin) .... One By Mouth Daily 4)  Caltrate 600+d 600-400 Mg-Unit  Tabs (Calcium Carbonate-Vitamin D) .... Daily 5)  Diclofenac Sodium 50 Mg  Tbec (Diclofenac Sodium) .... Take 1 Tablet By Mouth Two Times A Day 6)  Misoprostol 200 Mcg  Tabs (Misoprostol) .... One By Mouth Bid 7)  Vitamin D 1000 Unit Caps (Cholecalciferol) .... Once Daily  Allergies (verified): No Known Drug Allergies  Past History:  Past Medical History: Last updated: 05/01/2007 COPD/chronic bronchitis Hyperlipidemia Hypertension mood disorder recovering alcoholic Anxiety Depression GERD  Past Surgical History: Last updated: 12/17/2006 breast biopsy-neg Appendectomy--2002 Hysterectomy--fibroma--1978 Hammer toe L foot--2008  Family History: Last updated: 05/26/2009 mother throat cancer father MI 18 yo brother sarcoma brother---IPF (5 yo)  Social History: Last updated: 03/06/2007 Married Former Smoker Alcohol use-no Regular exercise-no  Risk Factors: Exercise: no (03/06/2007)  Risk Factors: Smoking Status: quit > 6 months (08/26/2009)  Physical Exam  General:  Well-developed,well-nourished,in no acute distress; alert,appropriate and cooperative throughout examination Head:  normocephalic  and atraumatic.   Eyes:  pupils equal and pupils round.   Ears:  R ear normal and L ear normal.   Nose:  no external deformity and no external erythema.   Neck:  No deformities, masses, or tenderness noted. Chest Wall:  No deformities, masses, or tenderness noted. Lungs:  normal respiratory effort and no intercostal retractions.   Heart:  normal rate and regular rhythm.   Abdomen:  Bowel sounds positive,abdomen soft  and non-tender without masses, organomegaly or hernias noted.  overweight Neurologic:  cranial nerves II-XII intact and gait normal.     Impression & Recommendations:  Problem # 1:  HYPERGLYCEMIA (ICD-790.29) a1c great trying to follow diet Labs Reviewed: Creat: 0.9 (08/19/2009)     Problem # 2:  GERD (ICD-530.81) no sxs on NSAID Her updated medication list for this problem includes:    Misoprostol 200 Mcg Tabs (Misoprostol) ..... One by mouth bid  Problem # 3:  HYPERTENSION (ICD-401.9)  Her updated medication list for this problem includes:    Losartan Potassium-hctz 100-25 Mg Tabs (Losartan potassium-hctz) .Marland Kitchen... Take 1 tablet by mouth once a day  BP today: 130/82 Prior BP: 118/74 (07/26/2009)  Labs Reviewed: K+: 3.7 (08/19/2009) Creat: : 0.9 (08/19/2009)   Chol: 184 (08/19/2009)   HDL: 50.40 (08/19/2009)   LDL: 123 (02/01/2009)   TG: 202.0 (08/19/2009)  Orders: Prescription Created Electronically [phone])  Problem # 4:  HYPERLIPIDEMIA (ICD-272.4) well controlled Her updated medication list for this problem includes:    Simvastatin 20 Mg Tabs (Simvastatin) .Marland Kitchen... Take 1 tablet by mouth once a day  Labs Reviewed: SGOT: 28 (08/19/2009)   SGPT: 32 (08/19/2009)   HDL:50.40 (08/19/2009), 44.70 (02/01/2009)  LDL:123 (02/01/2009), 109 (10/19/2008)  Chol:184 (08/19/2009), 198 (02/01/2009)  Trig:202.0 (08/19/2009), 153.0 (02/01/2009)  Complete Medication List: 1)  Losartan Potassium-hctz 100-25 Mg Tabs (Losartan potassium-hctz) .... Take 1 tablet by mouth once a day 2)  Simvastatin 20 Mg Tabs (Simvastatin) .... Take 1 tablet by mouth once a day 3)  Multivitamins Caps (Multiple vitamin) .... One by mouth daily 4)  Caltrate 600+d 600-400 Mg-unit Tabs (Calcium carbonate-vitamin d) .... Daily 5)  Diclofenac Sodium 50 Mg Tbec (Diclofenac sodium) .... Take 1 tablet by mouth two times a day 6)  Misoprostol 200 Mcg Tabs (Misoprostol) .... One by mouth bid 7)  Vitamin D 1000 Unit Caps  (Cholecalciferol) .... Once daily  Patient Instructions: 1)  Please schedule a follow-up appointment in 6 months. 2)  lipids 272.4 3)  liver 995.2 bmet-995.2 4)  cbc--285.9 Prescriptions: LOSARTAN POTASSIUM-HCTZ 100-25 MG TABS (LOSARTAN POTASSIUM-HCTZ) Take 1 tablet by mouth once a day  #90 x 3   Entered and Authorized by:   Birdie Sons MD   Signed by:   Birdie Sons MD on 08/26/2009   Method used:   Electronically to        [facility] (mail-order)             ,          Ph: [phone]       Fax: [phone]   RxID:   [id number]

## 2010-08-11 NOTE — Progress Notes (Signed)
Summary: refill at new pharmacy  Phone Note Refill Request Message from:  Patient---live call on ****new mail order pharmacy****  Refills Requested: Medication #1:  LOSARTAN POTASSIUM-HCTZ 100-25 MG TABS Take 1 tablet by mouth once a day  Medication #2:  SIMVASTATIN 20 MG TABS Take 1 tablet by mouth once a day  Medication #3:  MISOPROSTOL 200 MCG  TABS one by mouth bid  Medication #4:  DICLOFENAC SODIUM 50 MG  TBEC Take 1 tablet by mouth two times a day [facility]  Initial call taken by: Warnell Forester,  July 15, 2010 12:54 PM  Follow-up for Phone Call        Rx called to pharmacy Follow-up by: Alfred Levins, CMA,  July 15, 2010 1:59 PM    Prescriptions: MISOPROSTOL 200 MCG  TABS (MISOPROSTOL) one by mouth bid  #180 x 3   Entered by:   Alfred Levins, CMA   Authorized by:   Birdie Sons MD   Signed by:   Alfred Levins, CMA on 07/15/2010   Method used:   Electronically to        [facility] (mail-order)       [address]       [place], [place]  [address]       Ph: [phone]       Fax: [phone]   RxID:   [id number] DICLOFENAC SODIUM 50 MG  TBEC (DICLOFENAC SODIUM) Take 1 tablet by mouth two times a day  #180 x 3   Entered by:   Alfred Levins, CMA   Authorized by:   Birdie Sons MD   Signed by:   Alfred Levins, CMA on 07/15/2010   Method used:   Electronically to        [facility] (mail-order)       [address]       [place], [place]  [address]       Ph: [phone]       Fax: [phone]   RxID:   [id number] SIMVASTATIN 20 MG TABS (SIMVASTATIN) Take 1 tablet by mouth once a day  #90 x 3   Entered by:   Alfred Levins, CMA   Authorized by:   Birdie Sons MD   Signed by:   Alfred Levins, CMA on 07/15/2010   Method used:   Electronically to        [facility] ([facility]-order)       [address]       [place], [place]  [address]       Ph: [phone]       Fax:  [phone]   RxID:   [id number] LOSARTAN POTASSIUM-HCTZ 100-25 MG TABS (LOSARTAN POTASSIUM-HCTZ) Take 1 tablet by mouth once a day  #90 x 3   Entered by:   Alfred Levins, CMA   Authorized by:   Birdie Sons MD   Signed by:   Alfred Levins, CMA on 07/15/2010   Method used:   Electronically to        [facility] (mail-order)       [address] [place]  [address]       Ph: [phone]       Fax: [phone]   RxID:   [id number]

## 2010-08-16 ENCOUNTER — Encounter: Payer: Self-pay | Admitting: Internal Medicine

## 2010-08-16 ENCOUNTER — Ambulatory Visit (INDEPENDENT_AMBULATORY_CARE_PROVIDER_SITE_OTHER): Payer: MEDICARE | Admitting: Internal Medicine

## 2010-08-16 DIAGNOSIS — R7309 Other abnormal glucose: Secondary | ICD-10-CM

## 2010-08-16 DIAGNOSIS — I10 Essential (primary) hypertension: Secondary | ICD-10-CM

## 2010-08-16 DIAGNOSIS — K219 Gastro-esophageal reflux disease without esophagitis: Secondary | ICD-10-CM

## 2010-08-16 DIAGNOSIS — E785 Hyperlipidemia, unspecified: Secondary | ICD-10-CM

## 2010-08-16 LAB — HEPATIC FUNCTION PANEL
ALT: 29 U/L (ref 0–35)
Total Protein: 6.5 g/dL (ref 6.0–8.3)

## 2010-08-16 LAB — BASIC METABOLIC PANEL
BUN: 22 mg/dL (ref 6–23)
CO2: 29 mEq/L (ref 19–32)
Calcium: 9.7 mg/dL (ref 8.4–10.5)
Chloride: 103 mEq/L (ref 96–112)
Creatinine, Ser: 0.8 mg/dL (ref 0.4–1.2)
GFR: 76.83 mL/min (ref >60.00)
Potassium: 3.8 mEq/L (ref 3.5–5.1)
Sodium: 141 mEq/L (ref 135–145)

## 2010-08-16 LAB — HEMOGLOBIN A1C: Hgb A1c MFr Bld: 6.1 % (ref 4.6–6.5)

## 2010-08-16 LAB — LDL CHOLESTEROL, DIRECT: Direct LDL: 110.3 mg/dL

## 2010-08-16 LAB — LIPID PANEL
HDL: 48.8 mg/dL (ref >39.00)
Triglycerides: 293 mg/dL — ABNORMAL HIGH (ref 0.0–149.0)
VLDL: 58.6 mg/dL — ABNORMAL HIGH (ref 0.0–40.0)

## 2010-08-16 NOTE — Progress Notes (Signed)
  Subjective:    Patient ID: Sheila Shields, female    DOB: September 24, 1934, 76 y.o.   MRN: [id number]  HPI  Scheduled for CTS surgery with dr applington this month  Lipids---tolerating meds  htn---tolerating meds  Allergic sxs---doing well on current meds  Hx of elevated blood sugars  Review of Systems No other complaints---occasional cold sore No other specific complaints    Objective:   Physical Exam      Well-developed well-nourished female in no acute distress. HEENT exam atraumatic, normocephalic, extraocular muscles are intact. Neck is supple. No jugular venous distention no thyromegaly. Chest clear to auscultation without increased work of breathing. Cardiac exam S1 and S2 are regular. Abdominal exam active bowel sounds, soft, nontender. Extremities no edema. Neurologic exam she is alert without any motor sensory deficits. Gait is normal.    Assessment & Plan:

## 2010-08-17 NOTE — Letter (Signed)
Summary: [facility]   Imported By: Maryln Gottron 08/09/2010 12:31:22  _____________________________________________________________________  External Attachment:    Type:   Image     Comment:   External Document

## 2010-08-20 NOTE — Assessment & Plan Note (Signed)
Well controlled. Continue current medications  

## 2010-08-20 NOTE — Assessment & Plan Note (Signed)
Fair control. Continue current medications. 

## 2010-08-20 NOTE — Assessment & Plan Note (Signed)
Needs followup. Discussed weight loss. She should exercise daily. Follow calorie diet.

## 2010-08-20 NOTE — Assessment & Plan Note (Signed)
-   Check labs today  - Continue current medications

## 2010-08-22 ENCOUNTER — Ambulatory Visit (HOSPITAL_COMMUNITY): Payer: Medicare Other | Attending: Orthopedic Surgery

## 2010-08-22 ENCOUNTER — Ambulatory Visit (HOSPITAL_BASED_OUTPATIENT_CLINIC_OR_DEPARTMENT_OTHER)
Admission: RE | Admit: 2010-08-22 | Discharge: 2010-08-22 | Disposition: A | Discharge status: Home / Self Care | Payer: Medicare Other | Attending: Orthopedic Surgery | Admitting: Orthopedic Surgery

## 2010-08-22 DIAGNOSIS — M720 Palmar fascial fibromatosis [Dupuytren]: Secondary | ICD-10-CM | POA: Insufficient documentation

## 2010-08-22 DIAGNOSIS — G5600 Carpal tunnel syndrome, unspecified upper limb: Secondary | ICD-10-CM | POA: Insufficient documentation

## 2010-08-22 DIAGNOSIS — Z01818 Encounter for other preprocedural examination: Secondary | ICD-10-CM | POA: Insufficient documentation

## 2010-08-22 DIAGNOSIS — Z01812 Encounter for preprocedural laboratory examination: Secondary | ICD-10-CM | POA: Insufficient documentation

## 2010-08-22 LAB — POCT I-STAT 4, (NA,K, GLUC, HGB,HCT): Sodium: 143 mEq/L (ref 135–145)

## 2010-08-24 NOTE — Op Note (Signed)
  NAME:  Sheila Shields, Sheila Shields                 ACCOUNT NO.:  [id number]  MEDICAL RECORD NO.:  [id number]          PATIENT TYPE:  AMB  LOCATION:  [facility]                         FACILITY:  [facility]  PHYSICIAN:  Marlowe Kays, M.D.  DATE OF BIRTH:  May 24, 1935  DATE OF PROCEDURE:  08/22/2010 DATE OF DISCHARGE:                              OPERATIVE REPORT   PREOPERATIVE DIAGNOSES: 1. Carpal tunnel syndrome. 2. Dupuytren's band, left palm. Marland Kitchen  PSYCHIATRIC DIAGNOSES: 1. Carpal tunnel syndrome. 2. Dupuytren's band, left palm.  OPERATION: 1. Decompression median nerve, left wrist and hand. 2. Excision of Dupuytren's band, left palm.  SURGEON:  Marlowe Kays, MD  ASSISTANT:  Nurse.  ANESTHESIA:  General.  PATHOLOGY AND INDICATION FOR PROCEDURE:  She had carpal tunnel syndrome with a diagnosis confirmed with nerve conduction studies.  She also had about a 2 cm fibrous band overlying the fourth ray.  DESCRIPTION OF PROCEDURE:  Satisfactory general anesthesia, pneumatic tourniquet applied with the left upper extremity Esmarched out non- sterilely and the arm prepped with DuraPrep from midforearm to fingertips and draped in a sterile field.  Time-out performed.  I marked out my usual carpal tunnel incision crossing obliquely over the flexor crease of the wrist after starting in the distal forearm and then going around the thenar eminence, but instead of curving more to the radial side, I curved it more towards the lateral side leaving the fibrous band area adjacent and lateral to the incision.  I first performed a carpal tunnel release at the wrist.  I located the median nerve and protecting it and using bipolar cautery when necessary, I carefully released skin, subcutaneous tissue, and fascia in the distal palm.  The major pathology was in the carpal canal area.  I then undermined the ulnar flap side of the incision and excised carefully the Dupuytren's fascial pathology until all bands were  removed.  Then irrigated the wound with sterile saline and closed the skin and subcutaneous tissue only with interrupted 4-0 nylon mattress sutures.  Betadine, Adaptive dry sterile dressing were applied and the splint was applied.  Tourniquet was released and at the time of this dictation, she was on her way to recovery room in satisfactory condition with no known complications.          ______________________________ Marlowe Kays, M.D.     JA/MEDQ  D:  08/22/2010  T:  08/22/2010  Job:  [id number]  Electronically Signed by Marlowe Kays M.D. on 08/24/2010 11:10:51 AM

## 2010-09-12 ENCOUNTER — Other Ambulatory Visit: Payer: Self-pay | Admitting: Internal Medicine

## 2010-09-12 DIAGNOSIS — Z1231 Encounter for screening mammogram for malignant neoplasm of breast: Secondary | ICD-10-CM

## 2010-10-17 ENCOUNTER — Ambulatory Visit (HOSPITAL_COMMUNITY)
Admission: RE | Admit: 2010-10-17 | Discharge: 2010-10-17 | Disposition: A | Discharge status: Home / Self Care | Payer: Medicare Other | Source: Ambulatory Visit | Attending: Internal Medicine | Admitting: Internal Medicine

## 2010-10-17 DIAGNOSIS — Z1231 Encounter for screening mammogram for malignant neoplasm of breast: Secondary | ICD-10-CM | POA: Insufficient documentation

## 2010-11-09 ENCOUNTER — Ambulatory Visit: Payer: Medicare Other | Attending: Orthopedic Surgery

## 2010-11-09 DIAGNOSIS — M2560 Stiffness of unspecified joint, not elsewhere classified: Secondary | ICD-10-CM | POA: Insufficient documentation

## 2010-11-09 DIAGNOSIS — IMO0001 Reserved for inherently not codable concepts without codable children: Secondary | ICD-10-CM | POA: Insufficient documentation

## 2010-11-09 DIAGNOSIS — M79609 Pain in unspecified limb: Secondary | ICD-10-CM | POA: Insufficient documentation

## 2010-11-10 ENCOUNTER — Ambulatory Visit: Payer: Medicare Other

## 2010-11-14 ENCOUNTER — Ambulatory Visit: Payer: Medicare Other

## 2010-11-16 ENCOUNTER — Ambulatory Visit: Payer: Medicare Other | Admitting: Physical Therapy

## 2010-11-21 ENCOUNTER — Ambulatory Visit: Payer: Medicare Other

## 2010-11-23 ENCOUNTER — Encounter: Payer: Medicare Other | Admitting: Physical Therapy

## 2010-11-28 ENCOUNTER — Ambulatory Visit: Payer: Medicare Other

## 2010-11-30 ENCOUNTER — Encounter: Payer: Medicare Other | Admitting: Physical Therapy

## 2010-12-12 ENCOUNTER — Telehealth: Payer: Self-pay | Admitting: *Deleted

## 2010-12-12 ENCOUNTER — Ambulatory Visit (INDEPENDENT_AMBULATORY_CARE_PROVIDER_SITE_OTHER): Payer: Medicare Other | Admitting: Internal Medicine

## 2010-12-12 ENCOUNTER — Encounter: Payer: Self-pay | Admitting: Internal Medicine

## 2010-12-12 VITALS — BP 118/62 | HR 89 | Wt 203.0 lb

## 2010-12-12 DIAGNOSIS — J40 Bronchitis, not specified as acute or chronic: Secondary | ICD-10-CM

## 2010-12-12 DIAGNOSIS — R062 Wheezing: Secondary | ICD-10-CM

## 2010-12-12 DIAGNOSIS — J449 Chronic obstructive pulmonary disease, unspecified: Secondary | ICD-10-CM

## 2010-12-12 MED ORDER — METHYLPREDNISOLONE (PAK) 4 MG PO TABS: ORAL_TABLET | ORAL | Status: AC

## 2010-12-12 MED ORDER — ALBUTEROL SULFATE (2.5 MG/3ML) 0.083% IN NEBU: 2.5000 mg | INHALATION_SOLUTION | RESPIRATORY_TRACT | Status: DC

## 2010-12-12 MED ORDER — LEVOFLOXACIN 500 MG PO TABS: 500.0000 mg | ORAL_TABLET | Freq: Every day | ORAL | Status: AC

## 2010-12-12 MED ORDER — ALBUTEROL 90 MCG/ACT IN AERS: 2.0000 | INHALATION_SPRAY | Freq: Four times a day (QID) | RESPIRATORY_TRACT | Status: DC | PRN

## 2010-12-12 NOTE — Telephone Encounter (Signed)
Wheezing and coughing with sob.  Scheduled appt with Dr. Rodena Medin.

## 2010-12-13 ENCOUNTER — Ambulatory Visit: Payer: Medicare Other | Admitting: Internal Medicine

## 2010-12-13 ENCOUNTER — Ambulatory Visit: Payer: Medicare Other | Admitting: Family Medicine

## 2010-12-18 DIAGNOSIS — J40 Bronchitis, not specified as acute or chronic: Secondary | ICD-10-CM | POA: Insufficient documentation

## 2010-12-18 NOTE — Assessment & Plan Note (Addendum)
Suspect infxs trigger. Given albuterol neb and begin albuterol mdi prn. Medrol dosepak. Followup if no improvement or worsening.

## 2010-12-18 NOTE — Assessment & Plan Note (Signed)
Begin levaqin course to completion. Followup if no improvement or worsening.

## 2010-12-18 NOTE — Progress Notes (Signed)
  Subjective:    Patient ID: Sheila Shields, female    DOB: 11-01-1934, 76 y.o.   MRN: [id number]  HPI Pt presents to clinic for evaluation of cough and wheezing. States recent dental surgery secondary to abscess that communicated to maxillary sinus. Has ~6 day h/o cough, intermittent wheezing without dyspnea, fever or chills. No alleviating or exacerbating factors.  No other complaints.  Reviewed pmh, medications and allergies  Review of Systems See HPI     Objective:   Physical Exam  Nursing note and vitals reviewed. Constitutional: She appears well-developed and well-nourished. No distress.  HENT:  Head: Normocephalic and atraumatic.  Right Ear: External ear normal.  Left Ear: External ear normal.  Nose: Nose normal.  Mouth/Throat: Oropharynx is clear and moist. No oropharyngeal exudate.  Eyes: Conjunctivae are normal. No scleral icterus.  Neck: Neck supple.  Cardiovascular: Normal rate, regular rhythm and normal heart sounds.  Exam reveals no gallop and no friction rub.   No murmur heard. Pulmonary/Chest: Effort normal. No respiratory distress. She has wheezes. She has no rales.       Slight bilateral intermittent wheeze.   Lymphadenopathy:    She has no cervical adenopathy.  Neurological: She is alert.  Skin: Skin is warm and dry. No rash noted. She is not diaphoretic. No erythema.  Psychiatric: She has a normal mood and affect.          Assessment & Plan:

## 2010-12-30 ENCOUNTER — Encounter: Payer: Self-pay | Admitting: Internal Medicine

## 2010-12-30 ENCOUNTER — Ambulatory Visit (INDEPENDENT_AMBULATORY_CARE_PROVIDER_SITE_OTHER): Payer: Medicare Other | Admitting: Internal Medicine

## 2010-12-30 VITALS — BP 134/76 | HR 80 | Temp 98.1°F | Wt 206.0 lb

## 2010-12-30 DIAGNOSIS — R5383 Other fatigue: Secondary | ICD-10-CM | POA: Insufficient documentation

## 2010-12-30 DIAGNOSIS — R5381 Other malaise: Secondary | ICD-10-CM

## 2010-12-30 LAB — BASIC METABOLIC PANEL
BUN: 21 mg/dL (ref 6–23)
CO2: 28 mEq/L (ref 19–32)
Glucose, Bld: 135 mg/dL — ABNORMAL HIGH (ref 70–99)
Potassium: 3.7 mEq/L (ref 3.5–5.1)
Sodium: 141 mEq/L (ref 135–145)

## 2010-12-30 LAB — CBC WITH DIFFERENTIAL/PLATELET
Basophils Absolute: 0.1 10*3/uL (ref 0.0–0.1)
Basophils Relative: 0.8 % (ref 0.0–3.0)
Eosinophils Absolute: 0.2 10*3/uL (ref 0.0–0.7)
MCHC: 33.9 g/dL (ref 30.0–36.0)
MCV: 86.1 fl (ref 78.0–100.0)
Monocytes Absolute: 0.8 10*3/uL (ref 0.1–1.0)
Neutrophils Relative %: 59.4 % (ref 43.0–77.0)
Platelets: 271 10*3/uL (ref 150.0–400.0)
RDW: 15.4 % — ABNORMAL HIGH (ref 11.5–14.6)

## 2010-12-30 LAB — HEPATIC FUNCTION PANEL
AST: 35 U/L (ref 0–37)
Albumin: 3.8 g/dL (ref 3.5–5.2)
Alkaline Phosphatase: 72 U/L (ref 39–117)
Total Protein: 6.1 g/dL (ref 6.0–8.3)

## 2010-12-30 LAB — SEDIMENTATION RATE: Sed Rate: 23 mm/hr — ABNORMAL HIGH (ref 0–22)

## 2010-12-30 NOTE — Assessment & Plan Note (Signed)
She has ongoing fatigue Will check labs today

## 2010-12-30 NOTE — Progress Notes (Signed)
  Subjective:    Patient ID: Sheila Shields, female    DOB: 02-24-1935, 76 y.o.   MRN: [id number]  HPI  Complains of ongoing infection---has had significant oral care/sinus infections. Several antibiotics---see dr hodgin's note. No headache---main complaint is fatigue---"i'm tired"  Past Medical History  Diagnosis Date  . HYPERLIPIDEMIA 12/17/2006  . ANXIETY 12/18/2006  . ACUTE ALCOHOLIC INTOXICATION IN REMISSION 08/27/2007  . DEPRESSION 12/18/2006  . HYPERTENSION 12/17/2006  . COPD 12/17/2006  . GERD 05/01/2007  . ARTHRITIS 02/23/2010  . HYPERGLYCEMIA 08/27/2007   Past Surgical History  Procedure Date  . Breast biopsy     negative  . Abdominal hysterectomy 1978    fibroma  . Appendectomy 2002  . Hammer toe surgery 2008    left foot    reports that she has quit smoking. She does not have any smokeless tobacco history on file. She reports that she does not drink alcohol. Her drug history not on file. @FAMHXP (<SUBSCRIPT> error)@ No Known Allergies    Review of Systems  patient denies chest pain, shortness of breath, orthopnea. Denies lower extremity edema, abdominal pain, change in appetite, change in bowel movements. Patient denies rashes, musculoskeletal complaints. No other specific complaints in a complete review of systems.      Objective:   Physical Exam  Well-developed well-nourished female in no acute distress. HEENT exam atraumatic, normocephalic, extraocular muscles are intact. Neck is supple. No jugular venous distention no thyromegaly. Chest clear to auscultation without increased work of breathing. Cardiac exam S1 and S2 are regular. Abdominal exam active bowel sounds, soft, nontender. Extremities no edema. Neurologic exam she is alert without any motor sensory deficits. Gait is normal.        Assessment & Plan:

## 2011-01-04 ENCOUNTER — Other Ambulatory Visit (INDEPENDENT_AMBULATORY_CARE_PROVIDER_SITE_OTHER): Payer: Medicare Other

## 2011-01-04 DIAGNOSIS — D649 Anemia, unspecified: Secondary | ICD-10-CM

## 2011-01-04 DIAGNOSIS — Z79899 Other long term (current) drug therapy: Secondary | ICD-10-CM

## 2011-01-04 LAB — IBC PANEL
Saturation Ratios: 9.6 % — ABNORMAL LOW (ref 20.0–50.0)
Transferrin: 312.8 mg/dL (ref 212.0–360.0)

## 2011-01-04 LAB — CBC WITH DIFFERENTIAL/PLATELET
Basophils Absolute: 0 10*3/uL (ref 0.0–0.1)
Eosinophils Absolute: 0.3 10*3/uL (ref 0.0–0.7)
Lymphocytes Relative: 24.6 % (ref 12.0–46.0)
MCHC: 33.4 g/dL (ref 30.0–36.0)
Monocytes Relative: 8.4 % (ref 3.0–12.0)
Platelets: 302 10*3/uL (ref 150.0–400.0)
RDW: 15.7 % — ABNORMAL HIGH (ref 11.5–14.6)

## 2011-01-04 LAB — VITAMIN B12: Vitamin B-12: 400 pg/mL (ref 211–911)

## 2011-01-12 ENCOUNTER — Other Ambulatory Visit: Payer: Self-pay | Admitting: Internal Medicine

## 2011-01-12 DIAGNOSIS — D649 Anemia, unspecified: Secondary | ICD-10-CM

## 2011-01-12 MED ORDER — FERROUS SULFATE 325 (65 FE) MG PO TABS: 325.0000 mg | ORAL_TABLET | Freq: Every day | ORAL | Status: DC

## 2011-01-13 ENCOUNTER — Other Ambulatory Visit (INDEPENDENT_AMBULATORY_CARE_PROVIDER_SITE_OTHER): Payer: Medicare Other

## 2011-01-13 ENCOUNTER — Telehealth: Payer: Self-pay | Admitting: Internal Medicine

## 2011-01-13 DIAGNOSIS — K921 Melena: Secondary | ICD-10-CM

## 2011-01-13 DIAGNOSIS — IMO0001 Reserved for inherently not codable concepts without codable children: Secondary | ICD-10-CM

## 2011-01-13 LAB — HEMOCCULT GUIAC POC 1CARD (OFFICE)
Card #2 Fecal Occult Blod, POC: NEGATIVE
Card #3 Fecal Occult Blood, POC: NEGATIVE

## 2011-01-13 NOTE — Telephone Encounter (Signed)
Rescheduled to 01/20/11 2:15

## 2011-01-20 ENCOUNTER — Encounter: Payer: Self-pay | Admitting: Internal Medicine

## 2011-01-20 ENCOUNTER — Ambulatory Visit (INDEPENDENT_AMBULATORY_CARE_PROVIDER_SITE_OTHER): Payer: Medicare Other | Admitting: Internal Medicine

## 2011-01-20 VITALS — BP 142/78 | HR 100 | Ht 66.0 in | Wt 203.0 lb

## 2011-01-20 DIAGNOSIS — R5381 Other malaise: Secondary | ICD-10-CM

## 2011-01-20 DIAGNOSIS — D509 Iron deficiency anemia, unspecified: Secondary | ICD-10-CM

## 2011-01-20 DIAGNOSIS — R5383 Other fatigue: Secondary | ICD-10-CM

## 2011-01-20 DIAGNOSIS — Z791 Long term (current) use of non-steroidal anti-inflammatories (NSAID): Secondary | ICD-10-CM

## 2011-01-20 MED ORDER — PEG-KCL-NACL-NASULF-NA ASC-C 100 G PO SOLR: 1.0000 | Freq: Once | ORAL | Status: DC

## 2011-01-20 NOTE — Assessment & Plan Note (Signed)
Not sure this is all from Hgb 11 though ferritin<50 can cause fatigue.

## 2011-01-20 NOTE — Assessment & Plan Note (Addendum)
The cause of this is not entirely clear. Was heme negative. She is on diclofenac but is on the subcostal as well as omeprazole which should reduce the risk of NSAID ulcers of the upper gut. She could have colonic or smalll bowel NSAID lesions. Doubt colorectal cancer with prior colonoscopy results but possible.  Lab Results  Component Value Date   WBC 6.3 01/04/2011   HGB 11.1* 01/04/2011   HCT 33.2* 01/04/2011   MCV 86.2 01/04/2011   PLT 302.0 01/04/2011   Lab Results  Component Value Date   FERRITIN 8.2* 01/04/2011    Plan for EGD and colonoscopy to look for cause.

## 2011-01-20 NOTE — Patient Instructions (Signed)
You have been scheduled for an Endoscopy/Colonoscopy with separate instructions given. Your prep kit has been sent to your pharmacy for you to pick up. Please stop your iron supplement 7 days before the procedure.

## 2011-01-20 NOTE — Progress Notes (Signed)
  Subjective:    Patient ID: Sheila Shields, female    DOB: 1934-12-25, 76 y.o.   MRN: [id number]  HPI 76 year old white woman from prior colonoscopy with rectal. She went to her primary care physician with a fair amount of fatigue recently and was found to have a hemoglobin of 11 and a ferritin of 8. She was started on ferrous sulfate. Hemoccults were negative. B12 and folate were normal. I was asked to see her to consider evaluating for the causes of her anemia. Her  heartburn is controlled on PPI. Fatigue and tired past several mos Had multiple dental infections and brother passed away. Dental surgery with bone graft. Better some now. No bleeding noted. Chronic diclofenac but is on misoprostol and omeprazole.   Review of Systems Some insomnia does not sleep well every night. all other ROS negative.   Objective:   Physical Exam  Constitutional: She appears well-developed and well-nourished.       obese  Eyes: Conjunctivae are normal. No scleral icterus.  Cardiovascular: Normal rate, regular rhythm and normal heart sounds.   Pulmonary/Chest: Effort normal and breath sounds normal.  Abdominal: Soft. Bowel sounds are normal. She exhibits no distension and no mass. There is no tenderness.  Skin: Skin is warm and dry.  Psychiatric: She has a normal mood and affect.          Assessment & Plan:

## 2011-01-20 NOTE — Assessment & Plan Note (Signed)
Though on PPI and misoprostol could still have NSAID ulceration in upper or lower GI tract.

## 2011-01-26 ENCOUNTER — Ambulatory Visit (AMBULATORY_SURGERY_CENTER): Payer: Medicare Other | Admitting: Internal Medicine

## 2011-01-26 ENCOUNTER — Encounter: Payer: Self-pay | Admitting: Internal Medicine

## 2011-01-26 VITALS — BP 166/82 | HR 88 | Temp 97.7°F | Resp 18 | Ht 66.0 in | Wt 199.0 lb

## 2011-01-26 DIAGNOSIS — K219 Gastro-esophageal reflux disease without esophagitis: Secondary | ICD-10-CM

## 2011-01-26 DIAGNOSIS — K2100 Gastro-esophageal reflux disease with esophagitis, without bleeding: Secondary | ICD-10-CM

## 2011-01-26 DIAGNOSIS — D126 Benign neoplasm of colon, unspecified: Secondary | ICD-10-CM

## 2011-01-26 DIAGNOSIS — K529 Noninfective gastroenteritis and colitis, unspecified: Secondary | ICD-10-CM

## 2011-01-26 DIAGNOSIS — K5289 Other specified noninfective gastroenteritis and colitis: Secondary | ICD-10-CM

## 2011-01-26 DIAGNOSIS — Z791 Long term (current) use of non-steroidal anti-inflammatories (NSAID): Secondary | ICD-10-CM

## 2011-01-26 DIAGNOSIS — R5381 Other malaise: Secondary | ICD-10-CM

## 2011-01-26 DIAGNOSIS — K2990 Gastroduodenitis, unspecified, without bleeding: Secondary | ICD-10-CM

## 2011-01-26 DIAGNOSIS — K5730 Diverticulosis of large intestine without perforation or abscess without bleeding: Secondary | ICD-10-CM

## 2011-01-26 DIAGNOSIS — K2970 Gastritis, unspecified, without bleeding: Secondary | ICD-10-CM

## 2011-01-26 DIAGNOSIS — K2960 Other gastritis without bleeding: Secondary | ICD-10-CM

## 2011-01-26 DIAGNOSIS — D509 Iron deficiency anemia, unspecified: Secondary | ICD-10-CM

## 2011-01-26 HISTORY — PX: ESOPHAGOGASTRODUODENOSCOPY: SHX1529

## 2011-01-26 MED ORDER — SODIUM CHLORIDE 0.9 % IV SOLN: 500.0000 mL | INTRAVENOUS | Status: DC

## 2011-01-26 NOTE — Patient Instructions (Addendum)
Restart the ferrous sulfate and follow-up with Dr.Swords regarding the anemia and iron deficiency. I think you should have a blood count and iron level  by mid-August. You can contact his office to arrange this and I will also send him the results of the procedures today. i need to wait for the biopsies to return but suspect the diclofenac is irritating the small intestine and causing slow intermittent bleeding in very small amounts. i will let you know if so. If you can take iron and maintain a good hemoglobin level that should allow you to continue the diclofenac most likely.  You may also have a condition known as Barrett's esophagus - we will provide a handout to explain this pre-cancerous condition. This will require the biopsy results to confirm and I will send you a letter.

## 2011-01-27 ENCOUNTER — Telehealth: Payer: Self-pay | Admitting: *Deleted

## 2011-01-27 NOTE — Telephone Encounter (Signed)

## 2011-02-01 ENCOUNTER — Encounter: Payer: Self-pay | Admitting: Internal Medicine

## 2011-02-01 NOTE — Progress Notes (Signed)
Quick Note:  Reflux esophagitis on esophageal bxs Ileal erosions normal on biopsies Lymphoid follicle, not a polyp  No recall planned ______

## 2011-02-14 ENCOUNTER — Ambulatory Visit: Payer: MEDICARE | Admitting: Internal Medicine

## 2011-02-15 ENCOUNTER — Ambulatory Visit: Payer: Medicare Other | Admitting: Internal Medicine

## 2011-03-02 ENCOUNTER — Ambulatory Visit (INDEPENDENT_AMBULATORY_CARE_PROVIDER_SITE_OTHER): Payer: Medicare Other | Admitting: Internal Medicine

## 2011-03-02 DIAGNOSIS — F1021 Alcohol dependence, in remission: Secondary | ICD-10-CM

## 2011-03-02 DIAGNOSIS — D509 Iron deficiency anemia, unspecified: Secondary | ICD-10-CM

## 2011-03-02 DIAGNOSIS — R7309 Other abnormal glucose: Secondary | ICD-10-CM

## 2011-03-02 DIAGNOSIS — Z79899 Other long term (current) drug therapy: Secondary | ICD-10-CM

## 2011-03-02 DIAGNOSIS — I10 Essential (primary) hypertension: Secondary | ICD-10-CM

## 2011-03-02 DIAGNOSIS — E785 Hyperlipidemia, unspecified: Secondary | ICD-10-CM

## 2011-03-02 LAB — FERRITIN: Ferritin: 18.2 ng/mL (ref 10.0–291.0)

## 2011-03-02 LAB — HEPATIC FUNCTION PANEL
AST: 25 U/L (ref 0–37)
Albumin: 3.9 g/dL (ref 3.5–5.2)

## 2011-03-02 LAB — LIPID PANEL
Cholesterol: 205 mg/dL — ABNORMAL HIGH (ref 0–200)
Total CHOL/HDL Ratio: 4
Triglycerides: 272 mg/dL — ABNORMAL HIGH (ref 0.0–149.0)
VLDL: 54.4 mg/dL — ABNORMAL HIGH (ref 0.0–40.0)

## 2011-03-02 LAB — HEMOGLOBIN A1C: Hgb A1c MFr Bld: 5.9 % (ref 4.6–6.5)

## 2011-03-02 LAB — CBC WITH DIFFERENTIAL/PLATELET
Basophils Absolute: 0 10*3/uL (ref 0.0–0.1)
HCT: 38.1 % (ref 36.0–46.0)
Lymphocytes Relative: 25.2 % (ref 12.0–46.0)
Lymphs Abs: 1.5 10*3/uL (ref 0.7–4.0)
Monocytes Relative: 10.7 % (ref 3.0–12.0)
Neutrophils Relative %: 59.2 % (ref 43.0–77.0)
Platelets: 291 10*3/uL (ref 150.0–400.0)
RDW: 19.2 % — ABNORMAL HIGH (ref 11.5–14.6)
WBC: 6 10*3/uL (ref 4.5–10.5)

## 2011-03-02 NOTE — Progress Notes (Signed)
  Subjective:    Patient ID: Sheila Shields, female    DOB: [birth year], 76 y.o.   MRN: [id number]  HPI  Patient Active Problem List  Diagnoses  . HYPERLIPIDEMIA---tolerating meds  .   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   Marland Kitchen HYPERTENSION---tolerating meds  .   Marland Kitchen GERD---tolerating ppi and not having any sxs  .   Marland Kitchen HYPERGLYCEMIA---needs f/u  .   Marland Kitchen Iron deficiency anemia, unspecified---needs f/u  . NSAID long-term use---continues to use   Past Medical History  Diagnosis Date  . HYPERLIPIDEMIA 12/17/2006  . ANXIETY 12/18/2006  . ACUTE ALCOHOLIC INTOXICATION IN REMISSION 08/27/2007  . DEPRESSION 12/18/2006  . HYPERTENSION 12/17/2006  . COPD 12/17/2006  . GERD 05/01/2007  . ARTHRITIS 02/23/2010  . HYPERGLYCEMIA 08/27/2007  . Mood disorder   . Arthritis   . Rectal polyp 03/27/2007    adenoma  . Diverticulosis 03/27/2007   Past Surgical History  Procedure Date  . Breast biopsy     negative  . Abdominal hysterectomy 1978    fibroma  . Appendectomy 2002  . Hammer toe surgery 2008    left foot  . Colonoscopy w/ polypectomy 03/27/2007; n7/19/2012    2008: 4 mm adenoma, diverticulosis 2012: ileitis ? NSAID - likely, 2-3 mm cecal polyp LYMPHOID FOLLICLE, diverticulosis  . Esophagogastroduodenoscopy 01/26/2011    reflux esophagitis    reports that she has quit smoking. She does not have any smokeless tobacco history on file. She reports that she does not drink alcohol or use illicit drugs. family history includes Cancer in her brother; Heart attack (age of onset:66) in her father; Idiopathic pulmonary fibrosis (age of onset:70) in her brother; and Throat cancer in her mother.  There is no history of Colon cancer. No Known Allergies   Review of Systems  patient denies chest pain, shortness of breath, orthopnea. Denies lower extremity edema, abdominal pain, change in appetite, change in bowel movements. Patient denies rashes, musculoskeletal complaints. No other specific complaints in  a complete review of systems.      Objective:   Physical Exam  Well-developed well-nourished female in no acute distress. HEENT exam atraumatic, normocephalic, extraocular muscles are intact. Neck is supple. No jugular venous distention no thyromegaly. Chest clear to auscultation without increased work of breathing. Cardiac exam S1 and S2 are regular. Abdominal exam active bowel sounds, soft, nontender. Extremities no edema.       Assessment & Plan:

## 2011-03-02 NOTE — Assessment & Plan Note (Signed)
Check labs today.

## 2011-03-02 NOTE — Assessment & Plan Note (Signed)
BP Readings from Last 3 Encounters:  03/02/11 150/84  01/26/11 166/82  01/20/11 142/78   i've asked her to monitor bp at home

## 2011-03-02 NOTE — Assessment & Plan Note (Signed)
No recurrence. 

## 2011-05-24 ENCOUNTER — Other Ambulatory Visit: Payer: Self-pay | Admitting: Internal Medicine

## 2011-07-14 ENCOUNTER — Other Ambulatory Visit: Payer: Self-pay | Admitting: *Deleted

## 2011-07-14 MED ORDER — LOSARTAN POTASSIUM-HCTZ 100-25 MG PO TABS: 1.0000 | ORAL_TABLET | Freq: Every day | ORAL | Status: DC

## 2011-07-14 MED ORDER — FERROUS SULFATE 325 (65 FE) MG PO TABS: 325.0000 mg | ORAL_TABLET | Freq: Every day | ORAL | Status: DC

## 2011-07-14 MED ORDER — DICLOFENAC SODIUM 50 MG PO TBEC: 50.0000 mg | DELAYED_RELEASE_TABLET | Freq: Two times a day (BID) | ORAL | Status: DC

## 2011-07-14 MED ORDER — MISOPROSTOL 200 MCG PO TABS: 200.0000 ug | ORAL_TABLET | Freq: Two times a day (BID) | ORAL | Status: DC

## 2011-07-14 MED ORDER — LORATADINE 10 MG PO TABS: 10.0000 mg | ORAL_TABLET | Freq: Every day | ORAL | Status: DC

## 2011-07-14 MED ORDER — SIMVASTATIN 20 MG PO TABS: 20.0000 mg | ORAL_TABLET | Freq: Every day | ORAL | Status: DC

## 2011-07-17 ENCOUNTER — Encounter: Payer: Self-pay | Admitting: Family

## 2011-07-17 ENCOUNTER — Ambulatory Visit (INDEPENDENT_AMBULATORY_CARE_PROVIDER_SITE_OTHER): Payer: Medicare Other | Admitting: Family

## 2011-07-17 VITALS — BP 136/60 | HR 85 | Temp 97.7°F | Wt 200.0 lb

## 2011-07-17 DIAGNOSIS — J019 Acute sinusitis, unspecified: Secondary | ICD-10-CM

## 2011-07-17 MED ORDER — PREDNISONE 20 MG PO TABS: ORAL_TABLET | ORAL | Status: AC

## 2011-07-17 NOTE — Patient Instructions (Signed)

## 2011-07-17 NOTE — Progress Notes (Signed)
Subjective:    Patient ID: Sheila Shields, female    DOB: [birth year], 77 y.o.   MRN: [id number]  HPI 77 year old white female, former smoker, patient of Dr. Roma Kayser in today with complaints of sinus pressure and pain between the one on for about a week and a half. She has started an antibiotic. He continues to have pressure. She takes Claritin on a daily basis and uses a netty pot. Denies any sneezing coughing or congestion. She is concerned because she had dental problems anytime she develops a sinus infection. She is now having dental pain   Review of Systems  Constitutional: Negative.   HENT: Positive for congestion and sneezing.        Dental pain  Respiratory: Negative.   Cardiovascular: Negative.   Musculoskeletal: Negative.   Neurological: Negative.   Hematological: Negative.   Psychiatric/Behavioral: Negative.    Past Medical History  Diagnosis Date  . HYPERLIPIDEMIA 12/17/2006  . ANXIETY 12/18/2006  . ACUTE ALCOHOLIC INTOXICATION IN REMISSION 08/27/2007  . DEPRESSION 12/18/2006  . HYPERTENSION 12/17/2006  . COPD 12/17/2006  . GERD 05/01/2007  . ARTHRITIS 02/23/2010  . HYPERGLYCEMIA 08/27/2007  . Mood disorder   . Arthritis   . Rectal polyp 03/27/2007    adenoma  . Diverticulosis 03/27/2007    History   Social History  . Marital Status: Married    Spouse Name: N/A    Number of Children: 2  . Years of Education: N/A   Occupational History  . retired    Social History Main Topics  . Smoking status: Former Games developer  . Smokeless tobacco: Not on file  . Alcohol Use: No  . Drug Use: No  . Sexually Active: Not on file   Other Topics Concern  . Not on file   Social History Narrative  . No narrative on file    Past Surgical History  Procedure Date  . Breast biopsy     negative  . Abdominal hysterectomy 1978    fibroma  . Appendectomy 2002  . Hammer toe surgery 2008    left foot  . Colonoscopy w/ polypectomy 03/27/2007; n7/19/2012    2008: 4 mm adenoma,  diverticulosis 2012: ileitis ? NSAID - likely, 2-3 mm cecal polyp LYMPHOID FOLLICLE, diverticulosis  . Esophagogastroduodenoscopy 01/26/2011    reflux esophagitis    Family History  Problem Relation Age of Onset  . Throat cancer Mother   . Heart attack Father 110  . Idiopathic pulmonary fibrosis Brother 70  . Cancer Brother     sarcoma  . Colon cancer Neg Hx     No Known Allergies  Current Outpatient Prescriptions on File Prior to Visit  Medication Sig Dispense Refill  . albuterol (PROVENTIL) 90 MCG/ACT inhaler Inhale 2 puffs into the lungs every 6 (six) hours as needed for wheezing.  17 g  3  . Calcium Carbonate-Vitamin D (CALTRATE 600+D) 600-400 MG-UNIT per tablet Take 1 tablet by mouth daily.        . diclofenac (VOLTAREN) 50 MG EC tablet Take 1 tablet (50 mg total) by mouth 2 (two) times daily.  180 tablet  1  . ferrous sulfate 325 (65 FE) MG tablet Take 1 tablet (325 mg total) by mouth daily.  90 tablet  1  . loratadine (CLARITIN) 10 MG tablet Take 1 tablet (10 mg total) by mouth daily.  90 tablet  1  . losartan-hydrochlorothiazide (HYZAAR) 100-25 MG per tablet Take 1 tablet by mouth daily.  90 tablet  1  .  misoprostol (CYTOTEC) 200 MCG tablet Take 1 tablet (200 mcg total) by mouth 2 (two) times daily.  180 tablet  1  . Multiple Vitamin (MULTIVITAMIN) tablet Take 1 tablet by mouth daily.        Marland Kitchen omeprazole (PRILOSEC) 20 MG capsule Take 20 mg by mouth daily.        . simvastatin (ZOCOR) 20 MG tablet Take 1 tablet (20 mg total) by mouth at bedtime.  90 tablet  1   Current Facility-Administered Medications on File Prior to Visit  Medication Dose Route Frequency Provider Last Rate Last Dose  . 0.9 %  sodium chloride infusion  500 mL Intravenous Continuous Iva Boop, MD        BP 136/60  Pulse 85  Temp(Src) 97.7 F (36.5 C) (Oral)  Wt 200 lb (90.719 kg)chart    Objective:   Physical Exam  Constitutional: She is oriented to person, place, and time. She appears  well-developed and well-nourished.  HENT:  Right Ear: External ear normal.  Left Ear: External ear normal.  Nose: Nose normal.  Mouth/Throat: Oropharynx is clear and moist.       Maxillary sinus pressure noted to palpation.  Neck: Normal range of motion.  Cardiovascular: Normal rate, regular rhythm and normal heart sounds.   Pulmonary/Chest: Effort normal and breath sounds normal.  Musculoskeletal: Normal range of motion.  Neurological: She is alert and oriented to person, place, and time.  Skin: Skin is warm and dry.  Psychiatric: She has a normal mood and affect.          Assessment & Plan:  Assessment: Acute sinusitis  Plan: Continue antibiotic. Add prednisone 60 mg by mouth x3, 40 mg by mouth x340 mg by mouth x3. Continue over-the-counter symptomatic treatment for relief. Recheck as scheduled and when necessary.

## 2011-09-05 ENCOUNTER — Ambulatory Visit (INDEPENDENT_AMBULATORY_CARE_PROVIDER_SITE_OTHER): Payer: Medicare Other | Admitting: Internal Medicine

## 2011-09-05 ENCOUNTER — Encounter: Payer: Self-pay | Admitting: Internal Medicine

## 2011-09-05 VITALS — BP 154/66 | HR 88 | Temp 98.4°F | Ht 66.0 in | Wt 201.0 lb

## 2011-09-05 DIAGNOSIS — J449 Chronic obstructive pulmonary disease, unspecified: Secondary | ICD-10-CM

## 2011-09-05 MED ORDER — DOXYCYCLINE HYCLATE 100 MG PO TABS: 100.0000 mg | ORAL_TABLET | Freq: Two times a day (BID) | ORAL | Status: AC

## 2011-09-05 MED ORDER — PREDNISONE 10 MG PO TABS: ORAL_TABLET | ORAL | Status: DC

## 2011-09-05 NOTE — Assessment & Plan Note (Signed)
Acute exacerbation Will treat with ABX and steroids She will call if sxs persist

## 2011-09-05 NOTE — Progress Notes (Signed)
Patient ID: Sheila Shields, female   DOB: October 10, 1934, 77 y.o.   MRN: [id number] Comes in for general follow up but she has acute complaint of cough She developed uri sxs one week ago. She has some wheezing Cough is productive of green sputum No fever  Past Medical History  Diagnosis Date  . HYPERLIPIDEMIA 12/17/2006  . ANXIETY 12/18/2006  . ACUTE ALCOHOLIC INTOXICATION IN REMISSION 08/27/2007  . DEPRESSION 12/18/2006  . HYPERTENSION 12/17/2006  . COPD 12/17/2006  . GERD 05/01/2007  . ARTHRITIS 02/23/2010  . HYPERGLYCEMIA 08/27/2007  . Mood disorder   . Arthritis   . Rectal polyp 03/27/2007    adenoma  . Diverticulosis 03/27/2007    History   Social History  . Marital Status: Married    Spouse Name: N/A    Number of Children: 2  . Years of Education: N/A   Occupational History  . retired    Social History Main Topics  . Smoking status: Former Games developer  . Smokeless tobacco: Not on file  . Alcohol Use: No  . Drug Use: No  . Sexually Active: Not on file   Other Topics Concern  . Not on file   Social History Narrative  . No narrative on file    Past Surgical History  Procedure Date  . Breast biopsy     negative  . Abdominal hysterectomy 1978    fibroma  . Appendectomy 2002  . Hammer toe surgery 2008    left foot  . Colonoscopy w/ polypectomy 03/27/2007; n7/19/2012    2008: 4 mm adenoma, diverticulosis 2012: ileitis ? NSAID - likely, 2-3 mm cecal polyp LYMPHOID FOLLICLE, diverticulosis  . Esophagogastroduodenoscopy 01/26/2011    reflux esophagitis    Family History  Problem Relation Age of Onset  . Throat cancer Mother   . Heart attack Father 92  . Idiopathic pulmonary fibrosis Brother 70  . Cancer Brother     sarcoma  . Colon cancer Neg Hx     No Known Allergies  Current Outpatient Prescriptions on File Prior to Visit  Medication Sig Dispense Refill  . albuterol (PROVENTIL) 90 MCG/ACT inhaler Inhale 2 puffs into the lungs every 6 (six) hours as needed for  wheezing.  17 g  3  . Calcium Carbonate-Vitamin D (CALTRATE 600+D) 600-400 MG-UNIT per tablet Take 1 tablet by mouth daily.        . diclofenac (VOLTAREN) 50 MG EC tablet Take 1 tablet (50 mg total) by mouth 2 (two) times daily.  180 tablet  1  . ferrous sulfate 325 (65 FE) MG tablet Take 1 tablet (325 mg total) by mouth daily.  90 tablet  1  . loratadine (CLARITIN) 10 MG tablet Take 1 tablet (10 mg total) by mouth daily.  90 tablet  1  . losartan-hydrochlorothiazide (HYZAAR) 100-25 MG per tablet Take 1 tablet by mouth daily.  90 tablet  1  . misoprostol (CYTOTEC) 200 MCG tablet Take 1 tablet (200 mcg total) by mouth 2 (two) times daily.  180 tablet  1  . Multiple Vitamin (MULTIVITAMIN) tablet Take 1 tablet by mouth daily.        Marland Kitchen omeprazole (PRILOSEC) 20 MG capsule Take 20 mg by mouth daily.        . simvastatin (ZOCOR) 20 MG tablet Take 1 tablet (20 mg total) by mouth at bedtime.  90 tablet  1   Current Facility-Administered Medications on File Prior to Visit  Medication Dose Route Frequency Provider Last Rate Last Dose  .  DISCONTD: 0.9 %  sodium chloride infusion  500 mL Intravenous Continuous Iva Boop, MD         patient denies chest pain, shortness of breath, orthopnea. Denies lower extremity edema, abdominal pain, change in appetite, change in bowel movements. Patient denies rashes, musculoskeletal complaints. No other specific complaints in a complete review of systems.   BP 154/66  Pulse 88  Temp(Src) 98.4 F (36.9 C) (Oral)  Ht 5\' 6"  (1.676 m)  Wt 201 lb (91.173 kg)  BMI 32.44 kg/m2  Well-developed well-nourished female in no acute distress. HEENT exam atraumatic, normocephalic, extraocular muscles are intact. Neck is supple. No jugular venous distention no thyromegaly. Chest with bilateral wheezing and rhonchi. Cardiac exam S1 and S2 are regular. Abdominal exam active bowel sounds, soft, nontender. Extremities no edema. Neurologic exam she is alert without any motor sensory  deficits. Gait is normal.

## 2011-09-11 ENCOUNTER — Other Ambulatory Visit: Payer: Self-pay | Admitting: Internal Medicine

## 2011-09-11 DIAGNOSIS — Z1231 Encounter for screening mammogram for malignant neoplasm of breast: Secondary | ICD-10-CM

## 2011-09-18 ENCOUNTER — Other Ambulatory Visit: Payer: Self-pay | Admitting: Dermatology

## 2011-09-26 ENCOUNTER — Other Ambulatory Visit: Payer: Self-pay | Admitting: Internal Medicine

## 2011-10-18 ENCOUNTER — Ambulatory Visit (HOSPITAL_COMMUNITY)
Admission: RE | Admit: 2011-10-18 | Discharge: 2011-10-18 | Disposition: A | Discharge status: Home / Self Care | Payer: Medicare Other | Source: Ambulatory Visit | Attending: Internal Medicine | Admitting: Internal Medicine

## 2011-10-18 DIAGNOSIS — Z1231 Encounter for screening mammogram for malignant neoplasm of breast: Secondary | ICD-10-CM | POA: Insufficient documentation

## 2011-10-23 ENCOUNTER — Ambulatory Visit (INDEPENDENT_AMBULATORY_CARE_PROVIDER_SITE_OTHER): Payer: Medicare Other | Admitting: Family

## 2011-10-23 ENCOUNTER — Encounter: Payer: Self-pay | Admitting: Family

## 2011-10-23 VITALS — BP 120/70 | HR 84 | Temp 98.5°F

## 2011-10-23 DIAGNOSIS — J392 Other diseases of pharynx: Secondary | ICD-10-CM

## 2011-10-23 DIAGNOSIS — K122 Cellulitis and abscess of mouth: Secondary | ICD-10-CM

## 2011-10-23 DIAGNOSIS — J391 Other abscess of pharynx: Secondary | ICD-10-CM

## 2011-10-23 DIAGNOSIS — J029 Acute pharyngitis, unspecified: Secondary | ICD-10-CM

## 2011-10-23 MED ORDER — METHYLPREDNISOLONE ACETATE 80 MG/ML IJ SUSP
60.0000 mg | Freq: Once | INTRAMUSCULAR | Status: AC
  Administered 2011-10-23: 60 mg via INTRAMUSCULAR

## 2011-10-23 MED ORDER — AZITHROMYCIN 250 MG PO TABS: ORAL_TABLET | ORAL | Status: AC

## 2011-10-23 MED ORDER — METHYLPREDNISOLONE ACETATE 80 MG/ML IJ SUSP: 80.0000 mg | Freq: Once | INTRAMUSCULAR | Status: DC

## 2011-10-23 NOTE — Patient Instructions (Signed)
Uvulitis  Uvulitis is redness and soreness (inflammation) of the uvula. The uvula is the small tongue-shaped piece of tissue in the back of your mouth.   CAUSES  Infection is a common cause of uvulitis. Infection of the uvula can be either viral or bacterial. Infectious uvulitis usually only occurs in association with another condition, such as inflammation and infection of the mouth or throat.  Other causes of uvulitis include:   Trauma to the uvula.   Swelling from excess fluid buildup (edema), which may be an allergic reaction.   Inhalation of irritants, such as chemical agents, smoke, or steam.  DIAGNOSIS  Your caregiver can usually diagnose uvulitis through a physical examination. Bacterial uvulitis can be diagnosed through the results of the growth of samples of bodily substances taken from your mouth (cultures).  HOME CARE INSTRUCTIONS    Rest as much as possible.   Young children may suck on frozen juice bars or frozen ice pops. Older children and adults may gargle with a warm or cold liquid to help soothe the throat. (Mix  tsp of salt in 8 oz of water, or use strong tea.)   Use a cool-mist humidifier to lessen throat irritation and cough.   Drink enough fluids to keep your urine clear or pale yellow.   While the throat is very sore, eat soft or liquid foods such as milk, ice cream, soups, or milk drinks.   Family members who develop a sore throat or fever should have a medical exam or throat culture.   If your child has uvulitis and is taking antibiotic medicine, wait 24 hours or until his or her temperature is near normal (less than 100 F [37.8 C]) before allowing him or her to return to school or day care.   Only take over-the-counter or prescription medicines for pain, discomfort, or fever as directed by your caregiver.  Ask when your test results will be ready. Make sure you get your test results.  SEEK MEDICAL CARE IF:    You have an oral temperature above 102 F (38.9 C).   You  develop large, tender lumps your the neck.   Your child develops a rash.   You cough up green, yellow-brown, or bloody substances.  SEEK IMMEDIATE MEDICAL CARE IF:    You develop any new symptoms, such as vomiting, earache, severe headache, stiff neck, chest pain, or trouble breathing or swallowing.   Your airway is blocked.   You develop more severe throat pain along with drooling or voice changes.  Document Released: 02/04/2004 Document Revised: 06/15/2011 Document Reviewed: 09/01/2010  ExitCare Patient Information 2012 [employer].

## 2011-10-23 NOTE — Progress Notes (Signed)
Subjective:    Patient ID: Sheila Shields, female    DOB: [birth year], 77 y.o.   MRN: [id number]  HPI Comments: 77 yo white female presents with c/o sorethroat, sneezing, dry/constant cough x 5 days. Denies fever, chills, nausea, vomiting, sinus drainage, or headaches. Took OTC chloraseptic with no relief.   Sore Throat  Associated symptoms include trouble swallowing. Pertinent negatives include no congestion, drooling, ear discharge, ear pain or neck pain.      Review of Systems  Constitutional: Negative.   HENT: Positive for sore throat, sneezing, trouble swallowing and postnasal drip. Negative for hearing loss, ear pain, nosebleeds, congestion, facial swelling, rhinorrhea, drooling, mouth sores, neck pain, neck stiffness, dental problem, voice change, sinus pressure, tinnitus and ear discharge.   Eyes: Negative.   Respiratory: Negative.    Past Medical History  Diagnosis Date  . HYPERLIPIDEMIA 12/17/2006  . ANXIETY 12/18/2006  . ACUTE ALCOHOLIC INTOXICATION IN REMISSION 08/27/2007  . DEPRESSION 12/18/2006  . HYPERTENSION 12/17/2006  . COPD 12/17/2006  . GERD 05/01/2007  . ARTHRITIS 02/23/2010  . HYPERGLYCEMIA 08/27/2007  . Mood disorder   . Arthritis   . Rectal polyp 03/27/2007    adenoma  . Diverticulosis 03/27/2007    History   Social History  . Marital Status: Married    Spouse Name: N/A    Number of Children: 2  . Years of Education: N/A   Occupational History  . retired    Social History Main Topics  . Smoking status: Former Games developer  . Smokeless tobacco: Not on file  . Alcohol Use: No  . Drug Use: No  . Sexually Active: Not on file   Other Topics Concern  . Not on file   Social History Narrative  . No narrative on file    Past Surgical History  Procedure Date  . Breast biopsy     negative  . Abdominal hysterectomy 1978    fibroma  . Appendectomy 2002  . Hammer toe surgery 2008    left foot  . Colonoscopy w/ polypectomy 03/27/2007; n7/19/2012    2008: 4 mm  adenoma, diverticulosis 2012: ileitis ? NSAID - likely, 2-3 mm cecal polyp LYMPHOID FOLLICLE, diverticulosis  . Esophagogastroduodenoscopy 01/26/2011    reflux esophagitis    Family History  Problem Relation Age of Onset  . Throat cancer Mother   . Heart attack Father 65  . Idiopathic pulmonary fibrosis Brother 70  . Cancer Brother     sarcoma  . Colon cancer Neg Hx     No Known Allergies  Current Outpatient Prescriptions on File Prior to Visit  Medication Sig Dispense Refill  . albuterol (PROVENTIL) 90 MCG/ACT inhaler Inhale 2 puffs into the lungs every 6 (six) hours as needed for wheezing.  17 g  3  . Calcium Carbonate-Vitamin D (CALTRATE 600+D) 600-400 MG-UNIT per tablet Take 1 tablet by mouth daily.        . diclofenac (VOLTAREN) 50 MG EC tablet Take 1 tablet (50 mg total) by mouth 2 (two) times daily.  180 tablet  1  . ferrous sulfate 325 (65 FE) MG tablet Take 1 tablet (325 mg total) by mouth daily.  90 tablet  1  . ferrous sulfate 325 (65 FE) MG tablet TAKE 1 TABLET BY MOUTH EVERY DAY  30 tablet  3  . fish oil-omega-3 fatty acids 1000 MG capsule Take 2 g by mouth daily.      Marland Kitchen loratadine (CLARITIN) 10 MG tablet Take 1 tablet (10 mg total)  by mouth daily.  90 tablet  1  . losartan-hydrochlorothiazide (HYZAAR) 100-25 MG per tablet Take 1 tablet by mouth daily.  90 tablet  1  . misoprostol (CYTOTEC) 200 MCG tablet Take 1 tablet (200 mcg total) by mouth 2 (two) times daily.  180 tablet  1  . Multiple Vitamin (MULTIVITAMIN) tablet Take 1 tablet by mouth daily.        Marland Kitchen omeprazole (PRILOSEC) 20 MG capsule Take 20 mg by mouth daily.        . predniSONE (DELTASONE) 10 MG tablet 4 tabs, then 2 tabs, then 1 tab, then 1/2 tab daily daily for 3 days  25 tablet  0  . simvastatin (ZOCOR) 20 MG tablet Take 1 tablet (20 mg total) by mouth at bedtime.  90 tablet  1    BP 120/70  Pulse 84  Temp(Src) 98.5 F (36.9 C) (Oral)  SpO2 92%chart    Objective:   Physical Exam  Constitutional:  She is oriented to person, place, and time. She appears well-developed and well-nourished. No distress.  HENT:  Right Ear: External ear normal.  Left Ear: External ear normal.  Mouth/Throat: Oropharyngeal exudate present.  Eyes: Conjunctivae are normal. Pupils are equal, round, and reactive to light. Right eye exhibits no discharge. Left eye exhibits no discharge.  Neck: Neck supple. No JVD present. No tracheal deviation present. No thyromegaly present.  Cardiovascular: Normal rate, regular rhythm, normal heart sounds and intact distal pulses.  Exam reveals no gallop and no friction rub.   No murmur heard. Pulmonary/Chest: Effort normal and breath sounds normal. No stridor. No respiratory distress. She has no wheezes. She has no rales. She exhibits no tenderness.  Lymphadenopathy:    She has no cervical adenopathy.  Neurological: She is alert and oriented to person, place, and time.  Skin: Skin is warm and dry. She is not diaphoretic.          Assessment & Plan:  Assessment: Pharyngitis, uvulitis  Plan: Depo-medrol im, z-pack, rest and increase po fluids, teaching handout on diagnosis and treatment provided, opportunity for questions. Encouraged to RTC if s/s get worse.

## 2011-12-18 ENCOUNTER — Other Ambulatory Visit: Payer: Self-pay | Admitting: *Deleted

## 2011-12-18 MED ORDER — SIMVASTATIN 20 MG PO TABS: 20.0000 mg | ORAL_TABLET | Freq: Every day | ORAL | Status: DC

## 2011-12-18 MED ORDER — MISOPROSTOL 200 MCG PO TABS: 200.0000 ug | ORAL_TABLET | Freq: Two times a day (BID) | ORAL | Status: DC

## 2011-12-18 MED ORDER — LOSARTAN POTASSIUM-HCTZ 100-25 MG PO TABS: 1.0000 | ORAL_TABLET | Freq: Every day | ORAL | Status: DC

## 2011-12-25 ENCOUNTER — Other Ambulatory Visit: Payer: Self-pay | Admitting: *Deleted

## 2011-12-25 MED ORDER — DICLOFENAC SODIUM 50 MG PO TBEC: 50.0000 mg | DELAYED_RELEASE_TABLET | Freq: Two times a day (BID) | ORAL | Status: DC

## 2012-01-18 ENCOUNTER — Telehealth: Payer: Self-pay | Admitting: Internal Medicine

## 2012-01-18 NOTE — Telephone Encounter (Signed)
Caller: [name]/Patient; PCP: Birdie Sons; CB#: [phone]; ; ; Call regarding Swollen Ankles and Feet;  onset 01/17/12.  States she has gained 8 lbs within past 3-4 weeks without any changes in diet or exercise habits.  States her feet are uncomfortable and it  is difficult to put her shoes on.  The swelling does reduce overnight, but seems to reappear early in the day.   Per protocol, emergent symptoms denied; advised appt wihtin 24 hours.  Appt sched 01/19/12 0915 with Adline Mango FNP.

## 2012-01-19 ENCOUNTER — Ambulatory Visit (INDEPENDENT_AMBULATORY_CARE_PROVIDER_SITE_OTHER): Payer: Medicare Other | Admitting: Family

## 2012-01-19 ENCOUNTER — Encounter: Payer: Self-pay | Admitting: Family

## 2012-01-19 VITALS — BP 120/80 | HR 87 | Temp 97.7°F | Wt 207.0 lb

## 2012-01-19 DIAGNOSIS — J449 Chronic obstructive pulmonary disease, unspecified: Secondary | ICD-10-CM

## 2012-01-19 DIAGNOSIS — R609 Edema, unspecified: Secondary | ICD-10-CM

## 2012-01-19 LAB — POCT URINALYSIS DIPSTICK
Bilirubin, UA: NEGATIVE
Ketones, UA: NEGATIVE
Leukocytes, UA: NEGATIVE
Nitrite, UA: NEGATIVE
Protein, UA: NEGATIVE

## 2012-01-19 LAB — HEMOGLOBIN A1C: Hgb A1c MFr Bld: 6.2 % (ref 4.6–6.5)

## 2012-01-19 LAB — TSH: TSH: 1.38 u[IU]/mL (ref 0.35–5.50)

## 2012-01-19 LAB — BASIC METABOLIC PANEL
CO2: 28 mEq/L (ref 19–32)
Calcium: 9.3 mg/dL (ref 8.4–10.5)
Chloride: 102 mEq/L (ref 96–112)
Sodium: 140 mEq/L (ref 135–145)

## 2012-01-19 MED ORDER — FUROSEMIDE 20 MG PO TABS: 20.0000 mg | ORAL_TABLET | Freq: Every day | ORAL | Status: DC

## 2012-01-19 NOTE — Patient Instructions (Addendum)
Peripheral Edema You have swelling in your legs (peripheral edema). This swelling is due to excess accumulation of salt and water in your body. Edema may be a sign of heart, kidney or liver disease, or a side effect of a medication. It may also be due to problems in the leg veins. Elevating your legs and using special support stockings may be very helpful, if the cause of the swelling is due to poor venous circulation. Avoid long periods of standing, whatever the cause. Treatment of edema depends on identifying the cause. Chips, pretzels, pickles and other salty foods should be avoided. Restricting salt in your diet is almost always needed. Water pills (diuretics) are often used to remove the excess salt and water from your body via urine. These medicines prevent the kidney from reabsorbing sodium. This increases urine flow. Diuretic treatment may also result in lowering of potassium levels in your body. Potassium supplements may be needed if you have to use diuretics daily. Daily weights can help you keep track of your progress in clearing your edema. You should call your caregiver for follow up care as recommended. SEEK IMMEDIATE MEDICAL CARE IF:   You have increased swelling, pain, redness, or heat in your legs.   You develop shortness of breath, especially when lying down.   You develop chest or abdominal pain, weakness, or fainting.   You have a fever.  Document Released: 08/03/2004 Document Revised: 06/15/2011 Document Reviewed: 07/14/2009 [employer] Patient Information 2012 [employer]. 

## 2012-01-19 NOTE — Progress Notes (Signed)
Subjective:    Patient ID: Sheila Shields, female    DOB: 1934/08/13, 77 y.o.   MRN: [id number]  HPI 77 year old white female, nonsmoker, patient of Dr. Cato Mulligan is in today with complaints of swelling to both feet and ankles x1 week. Denies any increase in sodium intake. Normal urinary function. Reports the swelling comes and colds. Worse as the day progresses. Patient chronically has shortness of breath but is no worse than usual. Patient denies any lightheadedness, dizziness, chest pain, palpitations.   Review of Systems  Constitutional: Negative.   Respiratory: Negative.  Negative for cough.   Cardiovascular: Positive for leg swelling. Negative for chest pain and palpitations.  Genitourinary: Negative.  Negative for dysuria, urgency and frequency.  Musculoskeletal: Negative.   Neurological: Negative.   Hematological: Negative.   Psychiatric/Behavioral: Negative.    Past Medical History  Diagnosis Date  . HYPERLIPIDEMIA 12/17/2006  . ANXIETY 12/18/2006  . ACUTE ALCOHOLIC INTOXICATION IN REMISSION 08/27/2007  . DEPRESSION 12/18/2006  . HYPERTENSION 12/17/2006  . COPD 12/17/2006  . GERD 05/01/2007  . ARTHRITIS 02/23/2010  . HYPERGLYCEMIA 08/27/2007  . Mood disorder   . Arthritis   . Rectal polyp 03/27/2007    adenoma  . Diverticulosis 03/27/2007    History   Social History  . Marital Status: Married    Spouse Name: N/A    Number of Children: 2  . Years of Education: N/A   Occupational History  . retired    Social History Main Topics  . Smoking status: Former Games developer  . Smokeless tobacco: Not on file  . Alcohol Use: No  . Drug Use: No  . Sexually Active: Not on file   Other Topics Concern  . Not on file   Social History Narrative  . No narrative on file    Past Surgical History  Procedure Date  . Breast biopsy     negative  . Abdominal hysterectomy 1978    fibroma  . Appendectomy 2002  . Hammer toe surgery 2008    left foot  . Colonoscopy w/ polypectomy 03/27/2007;  n7/19/2012    2008: 4 mm adenoma, diverticulosis 2012: ileitis ? NSAID - likely, 2-3 mm cecal polyp LYMPHOID FOLLICLE, diverticulosis  . Esophagogastroduodenoscopy 01/26/2011    reflux esophagitis    Family History  Problem Relation Age of Onset  . Throat cancer Mother   . Heart attack Father 35  . Idiopathic pulmonary fibrosis Brother 70  . Cancer Brother     sarcoma  . Colon cancer Neg Hx     No Known Allergies  Current Outpatient Prescriptions on File Prior to Visit  Medication Sig Dispense Refill  . Calcium Carbonate-Vitamin D (CALTRATE 600+D) 600-400 MG-UNIT per tablet Take 1 tablet by mouth daily.        . diclofenac (VOLTAREN) 50 MG EC tablet Take 1 tablet (50 mg total) by mouth 2 (two) times daily.  180 tablet  1  . ferrous sulfate 325 (65 FE) MG tablet Take 1 tablet (325 mg total) by mouth daily.  90 tablet  1  . ferrous sulfate 325 (65 FE) MG tablet TAKE 1 TABLET BY MOUTH EVERY DAY  30 tablet  3  . fish oil-omega-3 fatty acids 1000 MG capsule Take 2 g by mouth daily.      Marland Kitchen loratadine (CLARITIN) 10 MG tablet Take 1 tablet (10 mg total) by mouth daily.  90 tablet  1  . losartan-hydrochlorothiazide (HYZAAR) 100-25 MG per tablet Take 1 tablet by mouth daily.  90 tablet  1  . misoprostol (CYTOTEC) 200 MCG tablet Take 1 tablet (200 mcg total) by mouth 2 (two) times daily.  180 tablet  1  . Multiple Vitamin (MULTIVITAMIN) tablet Take 1 tablet by mouth daily.        Marland Kitchen omeprazole (PRILOSEC) 20 MG capsule Take 20 mg by mouth daily.        . simvastatin (ZOCOR) 20 MG tablet Take 1 tablet (20 mg total) by mouth at bedtime.  90 tablet  1  . albuterol (PROVENTIL) 90 MCG/ACT inhaler Inhale 2 puffs into the lungs every 6 (six) hours as needed for wheezing.  17 g  3  . furosemide (LASIX) 20 MG tablet Take 1 tablet (20 mg total) by mouth daily.  30 tablet  0  . predniSONE (DELTASONE) 10 MG tablet 4 tabs, then 2 tabs, then 1 tab, then 1/2 tab daily daily for 3 days  25 tablet  0    BP  120/80  Pulse 87  Temp 97.7 F (36.5 C) (Oral)  Wt 207 lb (93.895 kg)  SpO2 98%chart    Objective:   Physical Exam  Constitutional: She is oriented to person, place, and time. She appears well-developed and well-nourished.  Neck: Normal range of motion. Neck supple.  Cardiovascular: Normal rate, regular rhythm and normal heart sounds.   Pulmonary/Chest: Effort normal and breath sounds normal.  Abdominal: Soft. Bowel sounds are normal.  Musculoskeletal: Normal range of motion. She exhibits no edema.       Bilateral peripheral edema 2+, pitting  Neurological: She is alert and oriented to person, place, and time.  Skin: Skin is warm and dry.  Psychiatric: She has a normal mood and affect.          Assessment & Plan:  Assessment: Peripheral edema, COPD  Plan: Lab sent to include UA, TSH, BNP, BMP will notify patient pending results. Continue current medications. Lasix 20 mg one tablet a day. Will follow the patient of the results of her labs, as scheduled, and when necessary.

## 2012-02-06 ENCOUNTER — Other Ambulatory Visit: Payer: Self-pay | Admitting: Internal Medicine

## 2012-03-05 ENCOUNTER — Encounter: Payer: Self-pay | Admitting: Internal Medicine

## 2012-03-05 ENCOUNTER — Ambulatory Visit (INDEPENDENT_AMBULATORY_CARE_PROVIDER_SITE_OTHER): Payer: Medicare Other | Admitting: Internal Medicine

## 2012-03-05 VITALS — BP 140/78 | HR 76 | Temp 97.9°F | Wt 206.0 lb

## 2012-03-05 DIAGNOSIS — M2550 Pain in unspecified joint: Secondary | ICD-10-CM

## 2012-03-05 NOTE — Progress Notes (Signed)
Patient ID: Sheila Shields, female   DOB: Oct 17, 1934, 77 y.o.   MRN: [id number]  Lipids- tolerating meds  gerd- no sxs on PPI  Alcohol- none in decade(s)  Past Medical History  Diagnosis Date  . HYPERLIPIDEMIA 12/17/2006  . ANXIETY 12/18/2006  . ACUTE ALCOHOLIC INTOXICATION IN REMISSION 08/27/2007  . DEPRESSION 12/18/2006  . HYPERTENSION 12/17/2006  . COPD 12/17/2006  . GERD 05/01/2007  . ARTHRITIS 02/23/2010  . HYPERGLYCEMIA 08/27/2007  . Mood disorder   . Arthritis   . Rectal polyp 03/27/2007    adenoma  . Diverticulosis 03/27/2007    History   Social History  . Marital Status: Married    Spouse Name: N/A    Number of Children: 2  . Years of Education: N/A   Occupational History  . retired    Social History Main Topics  . Smoking status: Former Games developer  . Smokeless tobacco: Not on file  . Alcohol Use: No  . Drug Use: No  . Sexually Active: Not on file   Other Topics Concern  . Not on file   Social History Narrative  . No narrative on file    Past Surgical History  Procedure Date  . Breast biopsy     negative  . Abdominal hysterectomy 1978    fibroma  . Appendectomy 2002  . Hammer toe surgery 2008    left foot  . Colonoscopy w/ polypectomy 03/27/2007; n7/19/2012    2008: 4 mm adenoma, diverticulosis 2012: ileitis ? NSAID - likely, 2-3 mm cecal polyp LYMPHOID FOLLICLE, diverticulosis  . Esophagogastroduodenoscopy 01/26/2011    reflux esophagitis    Family History  Problem Relation Age of Onset  . Throat cancer Mother   . Heart attack Father 31  . Idiopathic pulmonary fibrosis Brother 70  . Cancer Brother     sarcoma  . Colon cancer Neg Hx     No Known Allergies  Current Outpatient Prescriptions on File Prior to Visit  Medication Sig Dispense Refill  . Calcium Carbonate-Vitamin D (CALTRATE 600+D) 600-400 MG-UNIT per tablet Take 1 tablet by mouth daily.        . diclofenac (VOLTAREN) 50 MG EC tablet Take 1 tablet (50 mg total) by mouth 2 (two) times daily.   180 tablet  1  . ferrous sulfate 325 (65 FE) MG tablet TAKE 1 TABLET BY MOUTH EVERY DAY  30 tablet  3  . fish oil-omega-3 fatty acids 1000 MG capsule Take 2 g by mouth daily.      . furosemide (LASIX) 20 MG tablet Take 1 tablet (20 mg total) by mouth daily.  30 tablet  0  . losartan-hydrochlorothiazide (HYZAAR) 100-25 MG per tablet Take 1 tablet by mouth daily.  90 tablet  1  . misoprostol (CYTOTEC) 200 MCG tablet Take 1 tablet (200 mcg total) by mouth 2 (two) times daily.  180 tablet  1  . Multiple Vitamin (MULTIVITAMIN) tablet Take 1 tablet by mouth daily.        Marland Kitchen omeprazole (PRILOSEC) 20 MG capsule Take 20 mg by mouth daily.        . simvastatin (ZOCOR) 20 MG tablet Take 1 tablet (20 mg total) by mouth at bedtime.  90 tablet  1  . DISCONTD: albuterol (PROVENTIL) 90 MCG/ACT inhaler Inhale 2 puffs into the lungs every 6 (six) hours as needed for wheezing.  17 g  3  . DISCONTD: ferrous sulfate 325 (65 FE) MG tablet Take 1 tablet (325 mg total) by mouth daily.  90 tablet  1     patient denies chest pain, shortness of breath, orthopnea. Denies lower extremity edema, abdominal pain, change in appetite, change in bowel movements. Patient denies rashes, musculoskeletal complaints. No other specific complaints in a complete review of systems.   BP 140/78  Pulse 76  Temp 97.9 F (36.6 C) (Oral)  Wt 206 lb (93.441 kg)  Well-developed well-nourished female in no acute distress. HEENT exam atraumatic, normocephalic, extraocular muscles are intact. Neck is supple. No jugular venous distention no thyromegaly. Chest clear to auscultation without increased work of breathing. Cardiac exam S1 and S2 are regular. Abdominal exam active bowel sounds, soft, nontender. Extremities no edema.

## 2012-03-07 ENCOUNTER — Telehealth: Payer: Self-pay | Admitting: Internal Medicine

## 2012-03-07 MED ORDER — LOSARTAN POTASSIUM-HCTZ 100-25 MG PO TABS: 1.0000 | ORAL_TABLET | Freq: Every day | ORAL | Status: DC

## 2012-03-07 MED ORDER — FERROUS SULFATE 325 (65 FE) MG PO TABS: 325.0000 mg | ORAL_TABLET | Freq: Every day | ORAL | Status: DC

## 2012-03-07 MED ORDER — DICLOFENAC SODIUM 50 MG PO TBEC: 50.0000 mg | DELAYED_RELEASE_TABLET | Freq: Two times a day (BID) | ORAL | Status: DC

## 2012-03-07 MED ORDER — SIMVASTATIN 20 MG PO TABS: 20.0000 mg | ORAL_TABLET | Freq: Every day | ORAL | Status: DC

## 2012-03-07 MED ORDER — MISOPROSTOL 200 MCG PO TABS: 200.0000 ug | ORAL_TABLET | Freq: Two times a day (BID) | ORAL | Status: DC

## 2012-03-07 NOTE — Telephone Encounter (Signed)
Pt requesting 6 month refills on simvastatin (ZOCOR) 20 MG tablet,misoprostol (CYTOTEC) 200 MCG tablet, diclofenac (VOLTAREN) 50 MG EC tablet and losartan-hydrochlorothiazide (HYZAAR) 100-25 MG per tablet   Pt also requesting a 6 month refill on her Iron to be sent to [facility] [address]

## 2012-03-07 NOTE — Telephone Encounter (Signed)
rx sent in electronically 

## 2012-06-12 ENCOUNTER — Other Ambulatory Visit: Payer: Self-pay | Admitting: Internal Medicine

## 2012-07-09 ENCOUNTER — Other Ambulatory Visit: Payer: Self-pay | Admitting: *Deleted

## 2012-07-09 MED ORDER — SIMVASTATIN 20 MG PO TABS: 20.0000 mg | ORAL_TABLET | Freq: Every day | ORAL | Status: DC

## 2012-07-09 MED ORDER — LOSARTAN POTASSIUM-HCTZ 100-25 MG PO TABS: 1.0000 | ORAL_TABLET | Freq: Every day | ORAL | Status: DC

## 2012-07-09 MED ORDER — DICLOFENAC SODIUM 50 MG PO TBEC: 50.0000 mg | DELAYED_RELEASE_TABLET | Freq: Two times a day (BID) | ORAL | Status: DC

## 2012-07-09 MED ORDER — MISOPROSTOL 200 MCG PO TABS: 200.0000 ug | ORAL_TABLET | Freq: Two times a day (BID) | ORAL | Status: DC

## 2012-09-06 ENCOUNTER — Ambulatory Visit: Payer: Medicare Other | Admitting: Internal Medicine

## 2012-09-11 NOTE — Progress Notes (Signed)
Patient ID: Sheila Shields, female   DOB: 1934-08-05, 78 y.o.   MRN: [id number] Hyperglycemia-- needs f/u  Denies polyuria, polydipsia  htn-tolerating med  Alcohol- none in decades  Lipids- tolerating meds- needs f/u  She has a hx of ocular migraines and is having more. 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  Reviewed pmh, psh, sochx, meds   patient denies chest pain, shortness of breath, orthopnea. Denies lower extremity edema, abdominal pain, change in appetite, change in bowel movements. Patient denies rashes, musculoskeletal complaints. No other specific complaints in a complete review of systems.    Well-developed well-nourished female in no acute distress. HEENT exam atraumatic, normocephalic, extraocular muscles are intact. Neck is supple. No jugular venous distention no thyromegaly. Chest clear to auscultation without increased work of breathing. Cardiac exam S1 and S2 are regular. Abdominal exam active bowel sounds, soft, nontender. Extremities no edema. Neurologic exam she is alert without any motor sensory deficits. Gait is normal.

## 2012-09-11 NOTE — Assessment & Plan Note (Signed)
Fair control, continue current meds.  

## 2012-09-11 NOTE — Assessment & Plan Note (Signed)
Needs f/u labs She denies any significant sxs She should concentrate on weight loss

## 2012-09-11 NOTE — Assessment & Plan Note (Signed)
Check labs today. Continue simvastatin.  

## 2012-09-12 ENCOUNTER — Ambulatory Visit (INDEPENDENT_AMBULATORY_CARE_PROVIDER_SITE_OTHER): Payer: Medicare Other | Admitting: Internal Medicine

## 2012-09-12 VITALS — BP 146/72 | HR 76 | Temp 98.0°F | Wt 204.0 lb

## 2012-09-12 DIAGNOSIS — R7309 Other abnormal glucose: Secondary | ICD-10-CM

## 2012-09-12 DIAGNOSIS — E785 Hyperlipidemia, unspecified: Secondary | ICD-10-CM

## 2012-09-12 DIAGNOSIS — I10 Essential (primary) hypertension: Secondary | ICD-10-CM

## 2012-09-12 DIAGNOSIS — K219 Gastro-esophageal reflux disease without esophagitis: Secondary | ICD-10-CM

## 2012-09-12 LAB — HEMOGLOBIN A1C: Hgb A1c MFr Bld: 6.1 % (ref 4.6–6.5)

## 2012-09-12 LAB — LIPID PANEL: HDL: 44.1 mg/dL (ref >39.00)

## 2012-09-12 LAB — BASIC METABOLIC PANEL
BUN: 23 mg/dL (ref 6–23)
GFR: 49.81 mL/min — ABNORMAL LOW (ref >60.00)
Potassium: 3.2 mEq/L — ABNORMAL LOW (ref 3.5–5.1)

## 2012-09-12 NOTE — Assessment & Plan Note (Signed)
Well-controlled on PPI. ?

## 2012-09-14 ENCOUNTER — Other Ambulatory Visit: Payer: Self-pay | Admitting: *Deleted

## 2012-09-14 MED ORDER — POTASSIUM CHLORIDE CRYS ER 20 MEQ PO TBCR: 20.0000 meq | EXTENDED_RELEASE_TABLET | Freq: Every day | ORAL | Status: DC

## 2012-09-16 ENCOUNTER — Other Ambulatory Visit: Payer: Self-pay | Admitting: Dermatology

## 2012-09-24 ENCOUNTER — Other Ambulatory Visit: Payer: Self-pay | Admitting: Internal Medicine

## 2012-09-24 DIAGNOSIS — Z1231 Encounter for screening mammogram for malignant neoplasm of breast: Secondary | ICD-10-CM

## 2012-10-08 ENCOUNTER — Other Ambulatory Visit: Payer: Self-pay | Admitting: Dermatology

## 2012-10-09 ENCOUNTER — Other Ambulatory Visit: Payer: Self-pay | Admitting: Internal Medicine

## 2012-10-18 ENCOUNTER — Ambulatory Visit (HOSPITAL_COMMUNITY)
Admission: RE | Admit: 2012-10-18 | Discharge: 2012-10-18 | Disposition: A | Discharge status: Home / Self Care | Payer: Medicare Other | Source: Ambulatory Visit | Attending: Internal Medicine | Admitting: Internal Medicine

## 2012-10-18 DIAGNOSIS — Z1231 Encounter for screening mammogram for malignant neoplasm of breast: Secondary | ICD-10-CM | POA: Insufficient documentation

## 2012-12-12 ENCOUNTER — Other Ambulatory Visit: Payer: Self-pay | Admitting: *Deleted

## 2012-12-12 MED ORDER — SIMVASTATIN 20 MG PO TABS: 20.0000 mg | ORAL_TABLET | Freq: Every day | ORAL | Status: DC

## 2012-12-12 MED ORDER — LOSARTAN POTASSIUM-HCTZ 100-25 MG PO TABS: 1.0000 | ORAL_TABLET | Freq: Every day | ORAL | Status: DC

## 2012-12-12 MED ORDER — DICLOFENAC SODIUM 50 MG PO TBEC: 50.0000 mg | DELAYED_RELEASE_TABLET | Freq: Two times a day (BID) | ORAL | Status: DC

## 2012-12-12 MED ORDER — MISOPROSTOL 200 MCG PO TABS: 200.0000 ug | ORAL_TABLET | Freq: Two times a day (BID) | ORAL | Status: DC

## 2012-12-16 ENCOUNTER — Telehealth: Payer: Self-pay | Admitting: Internal Medicine

## 2012-12-16 NOTE — Telephone Encounter (Signed)
Pt had a low potassium 3mos ago. Dr Cato Mulligan rx'd her potassium pill s- pt sattes they are huge. She's taken them for 3mos. [facility], but she wants to know if she still needs to be taking them? She doesn't want to if she doesn't have to. Does she need a repeat potassium? If so, she needs to come today or tomorrow - if she can get the results tomorrow. They are leaving for [place] Wed a.m.  Please advise and call her re stopping med, lab test, etc.

## 2012-12-17 ENCOUNTER — Other Ambulatory Visit: Payer: Self-pay | Admitting: Internal Medicine

## 2012-12-17 MED ORDER — ALBUTEROL SULFATE HFA 108 (90 BASE) MCG/ACT IN AERS: 2.0000 | INHALATION_SPRAY | Freq: Four times a day (QID) | RESPIRATORY_TRACT | Status: DC | PRN

## 2012-12-17 NOTE — Telephone Encounter (Signed)
Pt aware.

## 2012-12-17 NOTE — Telephone Encounter (Signed)
She needs to continue them

## 2012-12-27 ENCOUNTER — Encounter: Payer: Self-pay | Admitting: Internal Medicine

## 2013-02-10 ENCOUNTER — Encounter: Payer: Self-pay | Admitting: Internal Medicine

## 2013-02-10 DIAGNOSIS — M25512 Pain in left shoulder: Secondary | ICD-10-CM

## 2013-02-11 ENCOUNTER — Other Ambulatory Visit: Payer: Self-pay | Admitting: Internal Medicine

## 2013-02-12 ENCOUNTER — Encounter: Payer: Self-pay | Admitting: Internal Medicine

## 2013-02-14 ENCOUNTER — Encounter: Payer: Self-pay | Admitting: Internal Medicine

## 2013-02-14 ENCOUNTER — Telehealth: Payer: Self-pay | Admitting: Internal Medicine

## 2013-02-14 NOTE — Telephone Encounter (Addendum)
Pt has already been seen by Dr Jillyn Hidden for left shoulder pain. Seen 01/02/03, 01/23/03, (01/29/03) MRI left shoulder dr bean, and came back in for test results They do not know why pt would be referred again to Dr Jillyn Hidden again. Pls advise.

## 2013-02-17 ENCOUNTER — Telehealth: Payer: Self-pay | Admitting: Internal Medicine

## 2013-02-17 NOTE — Telephone Encounter (Signed)
Left message for pt to call back  °

## 2013-02-17 NOTE — Telephone Encounter (Addendum)
Pt needs appt.  We need to get an EKG.  If you can get her in quicker with Padonda then schedule with her.  I misunderstood the messages, so this is my fault.  See if we can get her in with Padonda but I have not received anything from Dr Veronda Prude office

## 2013-02-17 NOTE — Telephone Encounter (Signed)
Patient is very frustrated because she states she has been trying repeatedly to get surgery clearance from Dr Cato Mulligan to have shoulder surgery by Dr Jillyn Hidden. However, the only call I see from Ortho is about a referral. Patient needs letter/form of clearance ASAP. She states she is willing to come and pick it up. Has Dr Veronda Prude office faxed [place] anything? Please advise & call pt. Thank you.

## 2013-02-18 ENCOUNTER — Ambulatory Visit (INDEPENDENT_AMBULATORY_CARE_PROVIDER_SITE_OTHER): Payer: Self-pay | Admitting: Family

## 2013-02-18 ENCOUNTER — Encounter: Payer: Self-pay | Admitting: Family

## 2013-02-18 VITALS — BP 136/74 | HR 81 | Wt 199.0 lb

## 2013-02-18 DIAGNOSIS — I10 Essential (primary) hypertension: Secondary | ICD-10-CM

## 2013-02-18 DIAGNOSIS — J449 Chronic obstructive pulmonary disease, unspecified: Secondary | ICD-10-CM

## 2013-02-18 DIAGNOSIS — E785 Hyperlipidemia, unspecified: Secondary | ICD-10-CM

## 2013-02-18 NOTE — Telephone Encounter (Signed)
Taken care of please seen phone note from 8/11

## 2013-02-18 NOTE — Patient Instructions (Signed)
Surgery for Rotator Cuff Tear  with Rehab  Rotator cuff surgery is only recommended for individuals who have experienced persistent disability for greater than 3 months of non-surgical (conservative) treatment. Surgery is not necessary but is recommended for individuals who experience difficulty completing daily activities or athletes who are unable to compete. Rotator cuff tears do not usually heal without surgical intervention. If left alone small rotator cuff tears usually become larger. Younger athletes who have a rotator cuff tear may be recommended for surgery without attempting conservative rehabilitation. The purpose of surgery is to regain function of the shoulder joint and eliminate pain associated with the injury. In addition to repairing the tendon tear, the surgery often removes a portion of the bony roof of the shoulder (acromion) as well as the chronically thickened and inflamed membrane below the acromion (subacromial bursa).  REASONS NOT TO OPERATE   · Infection of the shoulder.  · Inability to complete a rehabilitation program.  · Patients who have other conditions (emotional or psychological) conditions that contribute to their shoulder condition.  RISKS AND COMPLICATIONS  · Infection.  · Re-tear of the rotator cuff tendons or muscles.  · Shoulder stiffness and/or weakness.  · Inability to compete in athletics.  · Acromioclavicular (AC) joint paint.  · Risks of surgery: infection, bleeding, nerve damage, or damage to surrounding tissues.  TECHNIQUE  There are different surgical procedures used to treat rotator cuff tears. The type of procedure depends on the extent of injury as well as the surgeon's preference. All of the surgical techniques for rotator cuff tears have the same goal of repairing the torn tendon, removing part of the acromion, and removing the subacromial bursa. There are two main types of procedures: arthroscopic and open incision.  Arthroscopic procedures are usually completed  and you go home the same day as surgery (outpatient). These procedures use multiple small incisions in which tools and a video camera are placed to work on the shoulder. An electric shaver removes the bursa, then a power burr shaves down the portion of the acromion that places pressure on the rotator cuff. Finally the rotator cuff is sewed (sutured) back to the humeral head.  Open incision procedures require a larger incision. The deltoid muscle is detached from the acromion and a ligament in the shoulder (coracoacromial) is cut in order for the surgeon to access the rotator cuff. The subacromial bursa is removed as well as part of the acromion to give the rotator cuff room to move freely. The torn tendon is then sutured to the humeral head. After the rotator cuff is repaired, then the deltoid is reattached and the incision is closed up.   RECOVERY   · Post-operative care depends on the surgical technique and the preferences of your therapist.  · Keep the wound clean and dry for the first 10 to 14 days after surgery.  · Keep your shoulder and arm in the sling provided to you for as long as you have been instructed to.  · You will be given pain medications by your caregiver.  · Passive (without using muscles) shoulder movements may be begun immediately after surgery.  · It is important to follow through with you rehabilitation program in order to have the best possible recovery.  RETURN TO SPORTS   · The rehabilitation period will depend on the sport and position you play as well as the success of the operation.  · The minimum recovery period is 6 months.  · You must have   regained complete shoulder motion and strength before returning to sports.  SEEK IMMEDIATE MEDICAL CARE IF:   · Any medications produce adverse side effects.  · Any complications from surgery occur:  · Pain, numbness, or coldness in the extremity operated upon.  · Discoloration of the nail beds (they become blue or gray) of the extremity operated  upon.  · Signs of infections (fever, pain, inflammation, redness, or persistent bleeding).  EXERCISES   RANGE OF MOTION (ROM) AND STRETCHING EXERCISES - Rotator Cuff Tear, Surgery For  These exercises may help you restore your elbow mobility once your physician has discontinued your immobilization period. Beginning these before your provider's approval may result in delayed healing. Your symptoms may resolve with or without further involvement from your physician, physical therapist or athletic trainer. While completing these exercises, remember:   · Restoring tissue flexibility helps normal motion to return to the joints. This allows healthier, less painful movement and activity.  · An effective stretch should be held for at least 30 seconds. A stretch should never be painful. You should only feel a gentle lengthening or release in the stretch.  ROM - Pendulum   · Bend at the waist so that your right / left arm falls away from your body. Support yourself with your opposite hand on a solid surface, such as a table or a countertop.  · Your right / left arm should be perpendicular to the ground. If it is not perpendicular, you need to lean over farther. Relax the muscles in your right / left arm and shoulder as much as possible.  · Gently sway your hips and trunk so they move your right / left arm without any use of your right / left shoulder muscles.  · Progress your movements so that your right / left arm moves side to side, then forward and backward, and finally, both clockwise and counterclockwise.  · Complete __________ repetitions in each direction. Many people use this exercise to relieve discomfort in their shoulder as well as to gain range of motion.  Repeat __________ times. Complete this exercise __________ times per day.  STRETCH  Flexion, Seated   · Sit in a firm chair so that your right / left forearm can rest on a table or on a table or countertop. Your right / left elbow should rest below the height of  your shoulder so that your shoulder feels supported and not tense or uncomfortable.  · Keeping your right / left shoulder relaxed, lean forward at your waist, allowing your right / left hand to slide forward. Bend forward until you feel a moderate stretch in your shoulder, but before you feel an increase in your pain.  · Hold __________ seconds. Slowly return to your starting position.  Repeat __________ times. Complete this exercise __________ times per day.   STRETCH  Flexion, Standing   · Stand with good posture. With an underhand grip on your right / left and an overhand grip on the opposite hand, grasp a broomstick or cane so that your hands are a little more than shoulder-width apart.  · Keeping your right / left elbow straight and shoulder muscles relaxed, push the stick with your opposite hand to raise your right / left arm in front of your body and then overhead. Raise your arm until you feel a stretch in your right / left shoulder, but before you have increased shoulder pain.  · Try to avoid shrugging your right / left shoulder as your arm   rises by keeping your shoulder blade tucked down and toward your mid-back spine. Hold __________ seconds.  · Slowly return to the starting position.  Repeat __________ times. Complete this exercise __________ times per day.   STRETCH  Abduction, Supine   · Stand with good posture. With an underhand grip on your right / left and an overhand grip on the opposite hand, grasp a broomstick or cane so that your hands are a little more than shoulder-width apart.  · Keeping your right / left elbow straight and shoulder muscles relaxed, push the stick with your opposite hand to raise your right / left arm out to the side of your body and then overhead. Raise your arm until you feel a stretch in your right / left shoulder, but before you have increased shoulder pain.  · Try to avoid shrugging your right / left shoulder as your arm rises by keeping your shoulder blade tucked down  and toward your mid-back spine. Hold __________ seconds.  · Slowly return to the starting position.  Repeat __________ times. Complete this exercise __________ times per day.   ROM  Flexion, Active-Assisted  · Lie on your back. You may bend your knees for comfort.  · Grasp a broomstick or cane so your hands are about shoulder-width apart. Your right / left hand should grip the end of the stick/cane so that your hand is positioned "thumbs-up," as if you were about to shake hands.  · Using your healthy arm to lead, raise your right / left arm overhead until you feel a gentle stretch in your shoulder. Hold __________ seconds.  · Use the stick/cane to assist in returning your right / left arm to its starting position.  Repeat __________ times. Complete this exercise __________ times per day.   STRETCH  External Rotation   · Tuck a folded towel or small ball under your right / left upper arm. Grasp a broomstick or cane with an underhand grasp a little more than shoulder width apart. Bend your elbows to 90 degrees.  · Stand with good posture or sit in a chair without arms.  · Use your strong arm to push the stick across your body. Do not allow the towel or ball to fall. This will rotate your right / left arm away from your abdomen. Using the stick turn/rotate your hand and forearm away from your body. Hold __________ seconds.  Repeat __________ times. Complete this exercise __________ times per day.   STRENGTHENING EXERCISES - Rotator Cuff Tear, Surgery For  These exercises may help you begin to restore your elbow strength in the initial stage of your rehabilitation. Your physician will determine when you begin these exercises depending on the severity of your injury and the integrity of your repaired tissues. Beginning these before your provider's approval may result in delayed healing. While completing these exercises, remember:   · Muscles can gain both the endurance and the strength needed for everyday activities  through controlled exercises.  · Complete these exercises as instructed by your physician, physical therapist or athletic trainer. Progress the resistance and repetitions only as guided.  · You may experience muscle soreness or fatigue, but the pain or discomfort you are trying to eliminate should never worsen during these exercises. If this pain does worsen, stop and make certain you are following the directions exactly. If the pain is still present after adjustments, discontinue the exercise until you can discuss the trouble with your clinician.  STRENGTH - Shoulder Flexion, Isometric   ·   With good posture and facing a wall, stand or sit about 4-6 inches away.  · Keeping your right / left elbow straight, gently press the top of your fist into the wall. Increase the pressure gradually until you are pressing as hard as you can without shrugging your shoulder or increasing any shoulder discomfort.  · Hold __________ seconds. Release the tension slowly. Relax your shoulder muscles completely before you do the next repetition.  Repeat __________ times. Complete this exercise __________ times per day.   STRENGTH - Shoulder Abductors, Isometric   · With good posture, stand or sit about 4-6 inches from a wall with your right / left side facing the wall.  · Bend your right / left elbow. Gently press your right / left elbow into the wall. Increase the pressure gradually until you are pressing as hard as you can without shrugging your shoulder or increasing any shoulder discomfort.  · Hold __________ seconds.  · Release the tension slowly. Relax your shoulder muscles completely before you do the next repetition.  Repeat __________ times. Complete this exercise __________ times per day.   STRENGTH - Internal Rotators, Isometric   · Keep your right / left elbow at your side and bend it 90 degrees.  · Step into a door frame so that the inside of your right / left wrist can press against the door frame without your upper arm  leaving your side.  · Gently press your right / left wrist into the door frame as if you were trying to draw the palm of your hand to your abdomen. Gradually increase the tension until you are pressing as hard as you can without shrugging your shoulder or increasing any shoulder discomfort.  · Hold __________ seconds.  · Release the tension slowly. Relax your shoulder muscles completely before you do the next repetition.  Repeat __________ times. Complete this exercise __________ times per day.   STRENGTH - External Rotators, Isometric   · Keep your right / left elbow at your side and bend it 90 degrees.  · Step into a door frame so that the outside of your right / left wrist can press against the door frame without your upper arm leaving your side.  · Gently press your right / left wrist into the door frame as if you were trying to swing the back of your hand away from your abdomen. Gradually increase the tension until you are pressing as hard as you can without shrugging your shoulder or increasing any shoulder discomfort.  · Hold __________ seconds.  · Release the tension slowly. Relax your shoulder muscles completely before you do the next repetition.  Repeat __________ times. Complete this exercise __________ times per day.   Document Released: 06/26/2005 Document Revised: 09/18/2011 Document Reviewed: 10/08/2008  [employer]® Patient Information ©2014 [employer].

## 2013-02-18 NOTE — Progress Notes (Signed)
Subjective:    Patient ID: Sheila Shields, female    DOB: July 30, 1934, 78 y.o.   MRN: [id number]  HPI 78 year old white female, nonsmoker, patient of Dr. Timoteo Gaul in today for surgical clearance. She has a torn rotator cuff in the left side and has been in excruciating pain over the last month. She retained a 6/10 most days, worse with movement. She has decreased range of motion. Has been taking diclofenac and tramadol that helps her symptoms. She has a history of COPD, hypertension, anxiety, and GERD. All of which have been stable. She has not had a COPD exacerbation in many years. Last chest x-ray was normal.   Review of Systems  Constitutional: Negative.   HENT: Negative.   Eyes: Negative.   Respiratory: Negative.   Cardiovascular: Negative.   Gastrointestinal: Negative.   Endocrine: Negative.   Genitourinary: Negative.   Musculoskeletal: Negative.   Skin: Negative.   Allergic/Immunologic: Negative.   Neurological: Negative.   Hematological: Negative.   Psychiatric/Behavioral: Negative.    Past Medical History  Diagnosis Date  . HYPERLIPIDEMIA 12/17/2006  . ANXIETY 12/18/2006  . ACUTE ALCOHOLIC INTOXICATION IN REMISSION 08/27/2007  . DEPRESSION 12/18/2006  . HYPERTENSION 12/17/2006  . COPD 12/17/2006  . GERD 05/01/2007  . ARTHRITIS 02/23/2010  . HYPERGLYCEMIA 08/27/2007  . Mood disorder   . Arthritis   . Rectal polyp 03/27/2007    adenoma  . Diverticulosis 03/27/2007    History   Social History  . Marital Status: Married    Spouse Name: N/A    Number of Children: 2  . Years of Education: N/A   Occupational History  . retired    Social History Main Topics  . Smoking status: Former Games developer  . Smokeless tobacco: Not on file  . Alcohol Use: No  . Drug Use: No  . Sexually Active: Not on file   Other Topics Concern  . Not on file   Social History Narrative  . No narrative on file    Past Surgical History  Procedure Laterality Date  . Breast biopsy      negative  .  Abdominal hysterectomy  1978    fibroma  . Appendectomy  2002  . Hammer toe surgery  2008    left foot  . Colonoscopy w/ polypectomy  03/27/2007; n7/19/2012    2008: 4 mm adenoma, diverticulosis 2012: ileitis ? NSAID - likely, 2-3 mm cecal polyp LYMPHOID FOLLICLE, diverticulosis  . Esophagogastroduodenoscopy  01/26/2011    reflux esophagitis    Family History  Problem Relation Age of Onset  . Throat cancer Mother   . Heart attack Father 25  . Idiopathic pulmonary fibrosis Brother 70  . Cancer Brother     sarcoma  . Colon cancer Neg Hx     No Known Allergies  Current Outpatient Prescriptions on File Prior to Visit  Medication Sig Dispense Refill  . albuterol (PROVENTIL HFA;VENTOLIN HFA) 108 (90 BASE) MCG/ACT inhaler Inhale 2 puffs into the lungs every 6 (six) hours as needed for wheezing.  1 Inhaler  5  . Calcium Carbonate-Vitamin D (CALTRATE 600+D) 600-400 MG-UNIT per tablet Take 1 tablet by mouth daily.        . diclofenac (VOLTAREN) 50 MG EC tablet Take 1 tablet (50 mg total) by mouth 2 (two) times daily.  180 tablet  2  . ferrous sulfate 325 (65 FE) MG tablet TAKE 1 TABLET BY MOUTH EVERY DAY  30 tablet  3  . fish oil-omega-3 fatty acids 1000 MG  capsule Take 2 g by mouth daily.      Marland Kitchen losartan-hydrochlorothiazide (HYZAAR) 100-25 MG per tablet Take 1 tablet by mouth daily.  90 tablet  2  . misoprostol (CYTOTEC) 200 MCG tablet Take 1 tablet (200 mcg total) by mouth 2 (two) times daily.  180 tablet  2  . Multiple Vitamin (MULTIVITAMIN) tablet Take 1 tablet by mouth daily.        Marland Kitchen omeprazole (PRILOSEC) 20 MG capsule Take 20 mg by mouth daily.        . potassium chloride SA (K-DUR,KLOR-CON) 20 MEQ tablet Take 1 tablet (20 mEq total) by mouth daily.  90 tablet  3  . simvastatin (ZOCOR) 20 MG tablet Take 1 tablet (20 mg total) by mouth at bedtime.  90 tablet  2  . furosemide (LASIX) 20 MG tablet Take 1 tablet (20 mg total) by mouth daily.  30 tablet  0   No current  facility-administered medications on file prior to visit.    BP 136/74  Pulse 81  Wt 199 lb (90.266 kg)  BMI 32.13 kg/m2chart    Objective:   Physical Exam  Constitutional: She is oriented to person, place, and time. She appears well-developed and well-nourished.  HENT:  Right Ear: External ear normal.  Left Ear: External ear normal.  Nose: Nose normal.  Mouth/Throat: Oropharynx is clear and moist.  Cardiovascular: Normal rate, regular rhythm and normal heart sounds.   Pulmonary/Chest: Effort normal and breath sounds normal.  Abdominal: Soft. Bowel sounds are normal.  Musculoskeletal: Normal range of motion.  Neurological: She is alert and oriented to person, place, and time.  Skin: Skin is warm and dry.  Psychiatric: She has a normal mood and affect.      EKG: Sinus rhythm within normal limits    Assessment & Plan:  Assessment: 1. Surgical clearance  2. Hypertension 3. Hyperlipidemia 4. Depression  Plan:Faxed to Dr. Isaias Cowman for surgical clearance. Follow-up post surgery.

## 2013-02-19 ENCOUNTER — Other Ambulatory Visit: Payer: Self-pay | Admitting: Orthopedic Surgery

## 2013-02-19 NOTE — Progress Notes (Signed)
Need orders in EPIC.  Surgery scheduled for 03/06/13. Thank You.

## 2013-02-20 ENCOUNTER — Encounter (HOSPITAL_COMMUNITY): Payer: Self-pay | Admitting: Pharmacy Technician

## 2013-02-20 ENCOUNTER — Other Ambulatory Visit (HOSPITAL_COMMUNITY): Payer: Self-pay | Admitting: Specialist

## 2013-02-20 NOTE — Patient Instructions (Addendum)
20 KYLEAH PENSABENE  02/20/2013   Your procedure is scheduled on: 03-06-2013  Report to [facility] at 800 AM.  Call this number if you have problems the morning of surgery [phone]   Remember: check with dr Shelle Iron about stopping aspirin, diclofenac, misoprostol prior to surgery.   Do not eat food or drink liquids :After Midnight.     Take these medicines the morning of surgery with A SIP OF WATER: albuterol inhaler if needed, bring inhaler and leave with your spouse, prevacid, simvastatin                                SEE [facility] PREPARING FOR SURGERY SHEET   Do not wear jewelry, make-up or nail polish.  Do not wear lotions, powders, or perfumes. You may wear deodorant.   Men may shave face and neck.  Do not bring valuables to the hospital. [facility] IS NOT RESPONSIBLE FOR VALUEABLES.  Contacts, dentures or bridgework may not be worn into surgery.  Leave suitcase in the car. After surgery it may be brought to your room.  For patients admitted to the hospital, checkout time is 11:00 AM the day of discharge.   Patients discharged the day of surgery will not be allowed to drive home.  Name and phone number of your driver:  Special Instructions: N/A  Please review the following fact sheets: incentive spirometer fact sheet  Call Cain Sieve RN pre op nurse if needed [phone]    FAILURE TO FOLLOW THESE INSTRUCTIONS MAY RESULT IN THE CANCELLATION OF YOUR SURGERY.  PATIENT SIGNATURE___________________________________________  NURSE SIGNATURE_____________________________________________

## 2013-02-21 ENCOUNTER — Other Ambulatory Visit: Payer: Self-pay | Admitting: Orthopedic Surgery

## 2013-02-21 ENCOUNTER — Encounter (HOSPITAL_COMMUNITY): Payer: Self-pay

## 2013-02-21 ENCOUNTER — Ambulatory Visit (HOSPITAL_COMMUNITY)
Admission: RE | Admit: 2013-02-21 | Discharge: 2013-02-21 | Disposition: A | Discharge status: Home / Self Care | Payer: Medicare Other | Source: Ambulatory Visit | Attending: Specialist | Admitting: Specialist

## 2013-02-21 ENCOUNTER — Encounter (HOSPITAL_COMMUNITY)
Admission: RE | Admit: 2013-02-21 | Discharge: 2013-02-21 | Disposition: A | Discharge status: Home / Self Care | Payer: Medicare Other | Source: Ambulatory Visit | Attending: Specialist | Admitting: Specialist

## 2013-02-21 DIAGNOSIS — I700 Atherosclerosis of aorta: Secondary | ICD-10-CM | POA: Insufficient documentation

## 2013-02-21 DIAGNOSIS — Z01812 Encounter for preprocedural laboratory examination: Secondary | ICD-10-CM | POA: Insufficient documentation

## 2013-02-21 DIAGNOSIS — Z01818 Encounter for other preprocedural examination: Secondary | ICD-10-CM | POA: Insufficient documentation

## 2013-02-21 DIAGNOSIS — I10 Essential (primary) hypertension: Secondary | ICD-10-CM | POA: Insufficient documentation

## 2013-02-21 DIAGNOSIS — M47814 Spondylosis without myelopathy or radiculopathy, thoracic region: Secondary | ICD-10-CM | POA: Insufficient documentation

## 2013-02-21 LAB — BASIC METABOLIC PANEL
Chloride: 101 mEq/L (ref 96–112)
GFR calc Af Amer: 80 mL/min — ABNORMAL LOW (ref >90)
Potassium: 3.6 mEq/L (ref 3.5–5.1)

## 2013-02-21 LAB — CBC
MCH: 31 pg (ref 26.0–34.0)
MCHC: 34.4 g/dL (ref 30.0–36.0)
MCV: 90 fL (ref 78.0–100.0)
Platelets: 286 10*3/uL (ref 150–400)
RDW: 13.2 % (ref 11.5–15.5)

## 2013-02-21 NOTE — H&P (Signed)
Sheila Shields is an 78 y.o. female.   Chief Complaint: left shoulder pain HPI: left shoulder pain x 7 weeks s/p shoulder dislocation on 12/26/12 from a fall on the shoulder, closed reduction performed at [facility]. Symptoms reported today include: aching and numbness. Current treatment includes: relative rest, activity modification and pain medications. The following medication has been used for pain control: Tylenol.    Past Medical History  Diagnosis Date  . HYPERLIPIDEMIA 12/17/2006  . ANXIETY 12/18/2006  . ACUTE ALCOHOLIC INTOXICATION IN REMISSION 08/27/2007  . DEPRESSION 12/18/2006  . HYPERTENSION 12/17/2006  . COPD 12/17/2006  . GERD 05/01/2007  . ARTHRITIS 02/23/2010  . HYPERGLYCEMIA 08/27/2007  . Mood disorder   . Arthritis   . Rectal polyp 03/27/2007    adenoma  . Diverticulosis 03/27/2007    Past Surgical History  Procedure Laterality Date  . Breast biopsy      negative  . Hammer toe surgery  2008    left foot  . Colonoscopy w/ polypectomy  03/27/2007; n7/19/2012    2008: 4 mm adenoma, diverticulosis 2012: ileitis ? NSAID - likely, 2-3 mm cecal polyp LYMPHOID FOLLICLE, diverticulosis  . Esophagogastroduodenoscopy  01/26/2011    reflux esophagitis, colonscopy done also  . Appendectomy  2002  . Abdominal hysterectomy  1978    fibroma  . Carpal tunnel release Left 2010 or 2011    Family History  Problem Relation Age of Onset  . Throat cancer Mother   . Heart attack Father 66  . Idiopathic pulmonary fibrosis Brother 70  . Cancer Brother     sarcoma  . Colon cancer Neg Hx    Social History:  reports that she quit smoking about 28 years ago. Her smoking use included Cigarettes. She has a 60 pack-year smoking history. She has never used smokeless tobacco. She reports that she does not drink alcohol or use illicit drugs.  Allergies: No Known Allergies   (Not in a hospital admission)  Results for orders placed during the hospital encounter of  02/21/13 (from the past 48 hour(s))  BASIC METABOLIC PANEL     Status: Abnormal   Collection Time    02/21/13  9:20 AM      Result Value Range   Sodium 139  135 - 145 mEq/L   Potassium 3.6  3.5 - 5.1 mEq/L   Chloride 101  96 - 112 mEq/L   CO2 31  19 - 32 mEq/L   Glucose, Bld 108 (*) 70 - 99 mg/dL   BUN 21  6 - 23 mg/dL   Creatinine, Ser 0.81  0.50 - 1.10 mg/dL   Calcium 9.8  8.4 - 10.5 mg/dL   GFR calc non Af Amer 69 (*) >90 mL/min   GFR calc Af Amer 80 (*) >90 mL/min   Comment: (NOTE)     The eGFR has been calculated using the CKD EPI equation.     This calculation has not been validated in all clinical situations.     eGFR's persistently <90 mL/min signify possible Chronic Kidney     Disease.  CBC     Status: None   Collection Time    02/21/13  9:20 AM      Result Value Range   WBC 8.0  4.0 - 10.5 K/uL   RBC 4.49  3.87 - 5.11 MIL/uL   Hemoglobin 13.9  12.0 - 15.0 g/dL   HCT 40.4  36.0 - 46.0 %   MCV 90.0  78.0 -   100.0 fL   MCH 31.0  26.0 - 34.0 pg   MCHC 34.4  30.0 - 36.0 g/dL   RDW 13.2  11.5 - 15.5 %   Platelets 286  150 - 400 K/uL   Dg Chest 2 View  02/21/2013   CLINICAL DATA:  Preoperative for left shoulder surgery. History of hypertension.  EXAM: CHEST  2 VIEW  COMPARISON:  CT 08/22/2010  FINDINGS: Thoracic spondylosis noted with atherosclerotic calcification of the aortic arch. Heart size normal. The lungs appear clear. No pleural effusion. Patient unable to lift left arm on the lateral projection.  IMPRESSION: 1.  Atherosclerosis. Thoracic spondylosis. No acute findings.   Electronically Signed   By: Walt  Liebkemann   On: 02/21/2013 09:48    Review of Systems  Constitutional: Negative.   HENT: Negative.   Eyes: Negative.   Respiratory: Negative.   Cardiovascular: Negative.   Gastrointestinal: Negative.   Genitourinary: Negative.   Musculoskeletal: Positive for joint pain.  Skin: Negative.   Neurological: Negative.   Endo/Heme/Allergies: Negative.    Psychiatric/Behavioral: Negative.     There were no vitals taken for this visit. Physical Exam  Constitutional: She is oriented to person, place, and time. She appears well-developed and well-nourished.  HENT:  Head: Normocephalic and atraumatic.  Eyes: Conjunctivae and EOM are normal. Pupils are equal, round, and reactive to light.  Neck: Normal range of motion. Neck supple.  Cardiovascular: Normal rate and regular rhythm.   Respiratory: Effort normal and breath sounds normal.  GI: Soft. Bowel sounds are normal.  Musculoskeletal:  On exam weak in abduction. Tender in the anterior subacromial region. She still has decreased sensation along the biceps and medial aspect of the forearm. Biceps weakness is noted.  Inspection of the shoulder revealed no ecchymosis, soft tissue swelling, or deformity. On palpation, nontender over the AC. On range of motion the patient had full range of motion. Provocative signs indicated no impingement sign, no sulcus sign, negative speed's test. Negative lift off.  Neurological: She is alert and oriented to person, place, and time. She has normal reflexes.  Skin: Skin is warm and dry.  Psychiatric: She has a normal mood and affect.     MRI demonstrates complete and retracted tear of the rotator cuff.  EMG/NCV studies indicate some neurapraxia of the lateral cord of the brachial plexus.  Assessment/Plan 1. Status post shoulder dislocation with lateral cord of the brachial plexus, neurapraxia. 2. Massive tear of the rotator cuff, retracted.  Discussed options with the patient. In the presence of a massive tear, it would be wise to repair the rotator cuff.  I had a long discussion with the patient concerning the risks and benefits of the proposed shoulder surgery including need for rotator cuff repair, infection, suboptimal range of motion, adhesive capsulitis, and recurrent tear requiring further surgery. We also discussed the extended recovery  requirement for postoperative physical therapy and the time to maximum recovery. I provided the patient with an illustrated handout and discussed that in detail. We also discussed anesthetic complications, DVT, PE, cardiopulmonary dysfunction, etc.  I did however, discuss the distinct possibility that given the massive tear that we may not be able to repair the tendon. Even a partial repair would be preferred. Also discussed possibility of a patch graft. Avoid traction to the upper extremity given the brachial plexus injury. Suspect that should resolve over time. There is no denervation or Horner's syndrome. We kindly appreciate Dr. Bruce Swords preoperative clearance for her. She has used   Toradol as an analgesic in the past, which has helped. She does not like Morphine. She has no MRSA exposure. Continue sling and pendulum exercises in the interim.  I had a long discussion with the patient concerning the risks and benefits of a rotator cuff repair, including bleeding, infection, prolonged postoperative recovery, which may require 3 to 5 months until maximum medical improvement. Overight procedure with initiation of early passive range of motion within physical therapy. Avoid any active motion for the first six weeks. This is all in an effort to avoid recurrent tear of the rotator cuff and adhesive capsulitis. Return to work without use of the arm can be obtained following two weeks. However, driving will be a challenge. We also discussed the possibility of requiring implants including bone anchors,as well as an Allograft patch graft if a massive rotator cuff tear is encountered. Removal of any bones for spurs as well as bursitis will be performed during the procedure and also any associated anesthetic complications as well.  Plan left shoulder mini-open RCR, SAD, possible patch graft  BISSELL, JACLYN M. for Dr. Beane 02/21/2013, 1:57 PM    

## 2013-02-21 NOTE — Progress Notes (Signed)
Medical clearance note 02-18-13 [facility] epic ekg 02-18-13 epic

## 2013-03-03 ENCOUNTER — Ambulatory Visit (INDEPENDENT_AMBULATORY_CARE_PROVIDER_SITE_OTHER): Payer: Medicare Other | Admitting: Internal Medicine

## 2013-03-03 ENCOUNTER — Encounter: Payer: Self-pay | Admitting: Internal Medicine

## 2013-03-03 VITALS — BP 158/90 | HR 68 | Temp 98.0°F | Wt 196.0 lb

## 2013-03-03 DIAGNOSIS — Z23 Encounter for immunization: Secondary | ICD-10-CM

## 2013-03-03 DIAGNOSIS — M75102 Unspecified rotator cuff tear or rupture of left shoulder, not specified as traumatic: Secondary | ICD-10-CM

## 2013-03-03 DIAGNOSIS — S43429A Sprain of unspecified rotator cuff capsule, initial encounter: Secondary | ICD-10-CM

## 2013-03-03 NOTE — Progress Notes (Signed)
Scheduled for shoulder surgery- dr. Shelle Iron. She has been told to stop some meds prior to surgery and wants to review. Surgery scheduled for Thursday- she has been off nsaids for 5 days. She has had preop lab work  Reviewed pmh, psh, sochx, meds  Ros- no CP, SOB, pnd, orthopnea No other complaints  Exam: reviewed vitals Chest cta cv- reg rate abd- obese, soft, nontender  A/P-  RTC tear by hx She has had preop eval Stay off diclofenac, misoprostolol  Resume ASA after surgery.

## 2013-03-05 ENCOUNTER — Ambulatory Visit: Payer: Medicare Other | Admitting: Internal Medicine

## 2013-03-06 ENCOUNTER — Ambulatory Visit (HOSPITAL_COMMUNITY): Payer: Medicare Other | Admitting: Anesthesiology

## 2013-03-06 ENCOUNTER — Encounter (HOSPITAL_COMMUNITY): Payer: Self-pay | Admitting: *Deleted

## 2013-03-06 ENCOUNTER — Encounter (HOSPITAL_COMMUNITY): Payer: Self-pay | Admitting: Anesthesiology

## 2013-03-06 ENCOUNTER — Observation Stay (HOSPITAL_COMMUNITY)
Admission: RE | Admit: 2013-03-06 | Discharge: 2013-03-07 | Disposition: A | Discharge status: Home / Self Care | Payer: Medicare Other | Source: Ambulatory Visit | Attending: Specialist | Admitting: Specialist

## 2013-03-06 ENCOUNTER — Encounter (HOSPITAL_COMMUNITY)
Admission: RE | Disposition: A | Discharge status: Home / Self Care | Payer: Self-pay | Source: Ambulatory Visit | Attending: Specialist

## 2013-03-06 DIAGNOSIS — S43429A Sprain of unspecified rotator cuff capsule, initial encounter: Principal | ICD-10-CM | POA: Insufficient documentation

## 2013-03-06 DIAGNOSIS — M75102 Unspecified rotator cuff tear or rupture of left shoulder, not specified as traumatic: Secondary | ICD-10-CM

## 2013-03-06 DIAGNOSIS — E785 Hyperlipidemia, unspecified: Secondary | ICD-10-CM | POA: Insufficient documentation

## 2013-03-06 DIAGNOSIS — I10 Essential (primary) hypertension: Secondary | ICD-10-CM | POA: Insufficient documentation

## 2013-03-06 DIAGNOSIS — J4489 Other specified chronic obstructive pulmonary disease: Secondary | ICD-10-CM | POA: Insufficient documentation

## 2013-03-06 DIAGNOSIS — J449 Chronic obstructive pulmonary disease, unspecified: Secondary | ICD-10-CM | POA: Insufficient documentation

## 2013-03-06 DIAGNOSIS — K219 Gastro-esophageal reflux disease without esophagitis: Secondary | ICD-10-CM | POA: Insufficient documentation

## 2013-03-06 DIAGNOSIS — W19XXXA Unspecified fall, initial encounter: Secondary | ICD-10-CM | POA: Insufficient documentation

## 2013-03-06 HISTORY — PX: SHOULDER OPEN ROTATOR CUFF REPAIR: SHX2407

## 2013-03-06 LAB — CBC
Platelets: 250 10*3/uL (ref 150–400)
RBC: 4.18 MIL/uL (ref 3.87–5.11)
RDW: 13.1 % (ref 11.5–15.5)
WBC: 9.8 10*3/uL (ref 4.0–10.5)

## 2013-03-06 LAB — CREATININE, SERUM
Creatinine, Ser: 0.8 mg/dL (ref 0.50–1.10)
GFR calc Af Amer: 81 mL/min — ABNORMAL LOW (ref >90)
GFR calc non Af Amer: 70 mL/min — ABNORMAL LOW (ref >90)

## 2013-03-06 LAB — GLUCOSE, CAPILLARY: Glucose-Capillary: 112 mg/dL — ABNORMAL HIGH (ref 70–99)

## 2013-03-06 SURGERY — REPAIR, ROTATOR CUFF, OPEN
Anesthesia: General | Site: Shoulder | Laterality: Left | Wound class: Clean

## 2013-03-06 MED ORDER — HYDROMORPHONE HCL PF 1 MG/ML IJ SOLN: 0.2500 mg | INTRAMUSCULAR | Status: DC | PRN

## 2013-03-06 MED ORDER — ENOXAPARIN SODIUM 30 MG/0.3ML ~~LOC~~ SOLN
30.0000 mg | SUBCUTANEOUS | Status: DC
  Administered 2013-03-07: 30 mg via SUBCUTANEOUS
  Filled 2013-03-06 (×2): qty 0.3

## 2013-03-06 MED ORDER — MISOPROSTOL 200 MCG PO TABS
200.0000 ug | ORAL_TABLET | Freq: Two times a day (BID) | ORAL | Status: DC
  Administered 2013-03-06 – 2013-03-07 (×2): 200 ug via ORAL
  Filled 2013-03-06 (×3): qty 1

## 2013-03-06 MED ORDER — PHENYLEPHRINE HCL 10 MG/ML IJ SOLN
10.0000 mg | INTRAVENOUS | Status: DC | PRN
  Administered 2013-03-06: 10 ug/min via INTRAVENOUS

## 2013-03-06 MED ORDER — HYDROCHLOROTHIAZIDE 25 MG PO TABS
25.0000 mg | ORAL_TABLET | Freq: Every day | ORAL | Status: DC
  Administered 2013-03-06 – 2013-03-07 (×2): 25 mg via ORAL
  Filled 2013-03-06 (×2): qty 1

## 2013-03-06 MED ORDER — MENTHOL 3 MG MT LOZG
1.0000 | LOZENGE | OROMUCOSAL | Status: DC | PRN
  Filled 2013-03-06: qty 9

## 2013-03-06 MED ORDER — ASPIRIN EC 81 MG PO TBEC
81.0000 mg | DELAYED_RELEASE_TABLET | Freq: Every day | ORAL | Status: DC
  Administered 2013-03-06 – 2013-03-07 (×2): 81 mg via ORAL
  Filled 2013-03-06 (×2): qty 1

## 2013-03-06 MED ORDER — HYDROCODONE-ACETAMINOPHEN 5-325 MG PO TABS: 1.0000 | ORAL_TABLET | ORAL | Status: DC | PRN

## 2013-03-06 MED ORDER — PANTOPRAZOLE SODIUM 20 MG PO TBEC
20.0000 mg | DELAYED_RELEASE_TABLET | Freq: Every day | ORAL | Status: DC
  Administered 2013-03-07: 20 mg via ORAL
  Filled 2013-03-06: qty 1

## 2013-03-06 MED ORDER — FENTANYL CITRATE 0.05 MG/ML IJ SOLN: 50.0000 ug | INTRAMUSCULAR | Status: DC | PRN

## 2013-03-06 MED ORDER — ROCURONIUM BROMIDE 100 MG/10ML IV SOLN
INTRAVENOUS | Status: DC | PRN
  Administered 2013-03-06: 50 mg via INTRAVENOUS

## 2013-03-06 MED ORDER — SODIUM CHLORIDE 0.45 % IV SOLN
INTRAVENOUS | Status: DC
  Administered 2013-03-06: 17:00:00 via INTRAVENOUS

## 2013-03-06 MED ORDER — CEFAZOLIN SODIUM-DEXTROSE 2-3 GM-% IV SOLR
2.0000 g | Freq: Four times a day (QID) | INTRAVENOUS | Status: AC
  Administered 2013-03-06 – 2013-03-07 (×3): 2 g via INTRAVENOUS
  Filled 2013-03-06 (×3): qty 50

## 2013-03-06 MED ORDER — ONDANSETRON HCL 4 MG/2ML IJ SOLN: 4.0000 mg | Freq: Four times a day (QID) | INTRAMUSCULAR | Status: DC | PRN

## 2013-03-06 MED ORDER — HYDROCODONE-ACETAMINOPHEN 5-325 MG PO TABS: 1.0000 | ORAL_TABLET | Freq: Four times a day (QID) | ORAL | Status: DC | PRN

## 2013-03-06 MED ORDER — TRAMADOL HCL 50 MG PO TABS: 50.0000 mg | ORAL_TABLET | Freq: Four times a day (QID) | ORAL | Status: DC | PRN

## 2013-03-06 MED ORDER — METOCLOPRAMIDE HCL 5 MG/ML IJ SOLN: 5.0000 mg | Freq: Three times a day (TID) | INTRAMUSCULAR | Status: DC | PRN

## 2013-03-06 MED ORDER — PHENYLEPHRINE HCL 10 MG/ML IJ SOLN
INTRAMUSCULAR | Status: DC | PRN
  Administered 2013-03-06: 80 ug via INTRAVENOUS
  Administered 2013-03-06: 40 ug via INTRAVENOUS
  Administered 2013-03-06: 80 ug via INTRAVENOUS

## 2013-03-06 MED ORDER — LOSARTAN POTASSIUM-HCTZ 100-25 MG PO TABS: 1.0000 | ORAL_TABLET | Freq: Every morning | ORAL | Status: DC

## 2013-03-06 MED ORDER — LIDOCAINE HCL (CARDIAC) 20 MG/ML IV SOLN
INTRAVENOUS | Status: DC | PRN
  Administered 2013-03-06: 50 mg via INTRAVENOUS

## 2013-03-06 MED ORDER — NEOSTIGMINE METHYLSULFATE 1 MG/ML IJ SOLN
INTRAMUSCULAR | Status: DC | PRN
  Administered 2013-03-06: 2 mg via INTRAVENOUS

## 2013-03-06 MED ORDER — METHOCARBAMOL 100 MG/ML IJ SOLN
500.0000 mg | Freq: Four times a day (QID) | INTRAVENOUS | Status: DC | PRN
  Filled 2013-03-06: qty 5

## 2013-03-06 MED ORDER — CEFAZOLIN SODIUM-DEXTROSE 2-3 GM-% IV SOLR
2.0000 g | INTRAVENOUS | Status: AC
  Administered 2013-03-06: 2 g via INTRAVENOUS

## 2013-03-06 MED ORDER — DOCUSATE SODIUM 100 MG PO CAPS
100.0000 mg | ORAL_CAPSULE | Freq: Two times a day (BID) | ORAL | Status: DC
  Administered 2013-03-06 (×2): 100 mg via ORAL

## 2013-03-06 MED ORDER — FENTANYL CITRATE 0.05 MG/ML IJ SOLN
INTRAMUSCULAR | Status: DC | PRN
  Administered 2013-03-06 (×2): 50 ug via INTRAVENOUS

## 2013-03-06 MED ORDER — ONDANSETRON HCL 4 MG PO TABS: 4.0000 mg | ORAL_TABLET | Freq: Four times a day (QID) | ORAL | Status: DC | PRN

## 2013-03-06 MED ORDER — METHOCARBAMOL 500 MG PO TABS
500.0000 mg | ORAL_TABLET | Freq: Four times a day (QID) | ORAL | Status: DC | PRN
  Administered 2013-03-06 – 2013-03-07 (×3): 500 mg via ORAL
  Filled 2013-03-06 (×3): qty 1

## 2013-03-06 MED ORDER — HYDROMORPHONE HCL PF 1 MG/ML IJ SOLN
INTRAMUSCULAR | Status: DC | PRN
  Administered 2013-03-06: 1 mg via INTRAVENOUS

## 2013-03-06 MED ORDER — LACTATED RINGERS IV SOLN: INTRAVENOUS | Status: DC

## 2013-03-06 MED ORDER — SODIUM CHLORIDE 0.9 % IR SOLN
Status: DC | PRN
  Administered 2013-03-06: 11:00:00

## 2013-03-06 MED ORDER — PHENOL 1.4 % MT LIQD
1.0000 | OROMUCOSAL | Status: DC | PRN
  Filled 2013-03-06: qty 177

## 2013-03-06 MED ORDER — OXYCODONE-ACETAMINOPHEN 5-325 MG PO TABS
1.0000 | ORAL_TABLET | ORAL | Status: DC | PRN
  Administered 2013-03-06 (×2): 1 via ORAL
  Administered 2013-03-07 (×2): 2 via ORAL
  Filled 2013-03-06: qty 1
  Filled 2013-03-06 (×2): qty 2
  Filled 2013-03-06: qty 1

## 2013-03-06 MED ORDER — LOSARTAN POTASSIUM 50 MG PO TABS
100.0000 mg | ORAL_TABLET | Freq: Every day | ORAL | Status: DC
  Administered 2013-03-06 – 2013-03-07 (×2): 100 mg via ORAL
  Filled 2013-03-06 (×2): qty 2

## 2013-03-06 MED ORDER — BUPIVACAINE-EPINEPHRINE (PF) 0.5% -1:200000 IJ SOLN
INTRAMUSCULAR | Status: AC
  Filled 2013-03-06: qty 10

## 2013-03-06 MED ORDER — GLYCOPYRROLATE 0.2 MG/ML IJ SOLN
INTRAMUSCULAR | Status: DC | PRN
  Administered 2013-03-06: 0.3 mg via INTRAVENOUS

## 2013-03-06 MED ORDER — HYDROMORPHONE HCL PF 1 MG/ML IJ SOLN: 0.5000 mg | INTRAMUSCULAR | Status: DC | PRN

## 2013-03-06 MED ORDER — BUPIVACAINE-EPINEPHRINE 0.5% -1:200000 IJ SOLN
INTRAMUSCULAR | Status: DC | PRN
  Administered 2013-03-06: 10 mL

## 2013-03-06 MED ORDER — METOCLOPRAMIDE HCL 10 MG PO TABS: 5.0000 mg | ORAL_TABLET | Freq: Three times a day (TID) | ORAL | Status: DC | PRN

## 2013-03-06 MED ORDER — ACETAMINOPHEN 325 MG PO TABS: 650.0000 mg | ORAL_TABLET | Freq: Four times a day (QID) | ORAL | Status: DC | PRN

## 2013-03-06 MED ORDER — CEFAZOLIN SODIUM-DEXTROSE 2-3 GM-% IV SOLR
INTRAVENOUS | Status: AC
  Filled 2013-03-06: qty 50

## 2013-03-06 MED ORDER — ALBUTEROL SULFATE HFA 108 (90 BASE) MCG/ACT IN AERS
2.0000 | INHALATION_SPRAY | Freq: Four times a day (QID) | RESPIRATORY_TRACT | Status: DC | PRN
  Filled 2013-03-06: qty 6.7

## 2013-03-06 MED ORDER — LACTATED RINGERS IV SOLN
INTRAVENOUS | Status: DC
  Administered 2013-03-06: 1000 mL via INTRAVENOUS

## 2013-03-06 MED ORDER — BUPIVACAINE LIPOSOME 1.3 % IJ SUSP
20.0000 mL | Freq: Once | INTRAMUSCULAR | Status: AC
  Administered 2013-03-06: 20 mL
  Filled 2013-03-06: qty 20

## 2013-03-06 MED ORDER — POTASSIUM CHLORIDE CRYS ER 20 MEQ PO TBCR
20.0000 meq | EXTENDED_RELEASE_TABLET | Freq: Every day | ORAL | Status: DC
  Administered 2013-03-06 – 2013-03-07 (×2): 20 meq via ORAL
  Filled 2013-03-06 (×2): qty 1

## 2013-03-06 MED ORDER — LACTATED RINGERS IV SOLN
INTRAVENOUS | Status: DC | PRN
  Administered 2013-03-06 (×2): via INTRAVENOUS

## 2013-03-06 MED ORDER — ONDANSETRON HCL 4 MG/2ML IJ SOLN
INTRAMUSCULAR | Status: DC | PRN
  Administered 2013-03-06: 4 mg via INTRAVENOUS

## 2013-03-06 MED ORDER — ACETAMINOPHEN 650 MG RE SUPP: 650.0000 mg | Freq: Four times a day (QID) | RECTAL | Status: DC | PRN

## 2013-03-06 MED ORDER — PROPOFOL 10 MG/ML IV BOLUS
INTRAVENOUS | Status: DC | PRN
  Administered 2013-03-06: 150 mg via INTRAVENOUS

## 2013-03-06 MED ORDER — MORPHINE SULFATE 2 MG/ML IJ SOLN: 1.0000 mg | INTRAMUSCULAR | Status: DC | PRN

## 2013-03-06 SURGICAL SUPPLY — 44 items
ANCHOR PEEK 4.75X19.1 SWLK C (Anchor) ×6 IMPLANT
ANCHOR PEEK CORKSCREW 4.5 (Anchor) ×2 IMPLANT
BAG ZIPLOCK 12X15 (MISCELLANEOUS) ×2 IMPLANT
BLADE OSCILLATING/SAGITTAL (BLADE)
BLADE SW THK.38XMED LNG THN (BLADE) IMPLANT
BUR OVAL CARBIDE 4.0 (BURR) IMPLANT
CLEANER TIP ELECTROSURG 2X2 (MISCELLANEOUS) ×2 IMPLANT
CLOTH 2% CHLOROHEXIDINE 3PK (PERSONAL CARE ITEMS) ×2 IMPLANT
CLOTH BEACON ORANGE TIMEOUT ST (SAFETY) ×2 IMPLANT
DECANTER SPIKE VIAL GLASS SM (MISCELLANEOUS) ×2 IMPLANT
DRAPE ORTHO SPLIT 77X108 STRL (DRAPES) ×1
DRAPE POUCH INSTRU U-SHP 10X18 (DRAPES) ×2 IMPLANT
DRAPE SURG ORHT 6 SPLT 77X108 (DRAPES) ×1 IMPLANT
DRSG AQUACEL AG ADV 3.5X 4 (GAUZE/BANDAGES/DRESSINGS) ×2 IMPLANT
DRSG EMULSION OIL 3X3 NADH (GAUZE/BANDAGES/DRESSINGS) IMPLANT
DURAPREP 26ML APPLICATOR (WOUND CARE) ×2 IMPLANT
ELECT NEEDLE TIP 2.8 STRL (NEEDLE) ×2 IMPLANT
ELECT REM PT RETURN 9FT ADLT (ELECTROSURGICAL) ×2
ELECTRODE REM PT RTRN 9FT ADLT (ELECTROSURGICAL) ×1 IMPLANT
GLOVE BIOGEL PI IND STRL 7.5 (GLOVE) ×1 IMPLANT
GLOVE BIOGEL PI INDICATOR 7.5 (GLOVE) ×1
GLOVE SURG SS PI 7.5 STRL IVOR (GLOVE) ×2 IMPLANT
GLOVE SURG SS PI 8.0 STRL IVOR (GLOVE) ×2 IMPLANT
GOWN BRE IMP PREV XXLGXLNG (GOWN DISPOSABLE) ×2 IMPLANT
GOWN STRL REIN XL XLG (GOWN DISPOSABLE) ×4 IMPLANT
KIT BASIN OR (CUSTOM PROCEDURE TRAY) ×2 IMPLANT
MANIFOLD NEPTUNE II (INSTRUMENTS) ×2 IMPLANT
NEEDLE MA TROC 1/2 (NEEDLE) IMPLANT
NEEDLE MA TROC 1/2 CIR (NEEDLE) IMPLANT
NEEDLE SCORPION MULTI FIRE (NEEDLE) ×2 IMPLANT
PACK SHOULDER CUSTOM OPM052 (CUSTOM PROCEDURE TRAY) ×2 IMPLANT
SLING ARM IMMOBILIZER LRG (SOFTGOODS) IMPLANT
SPONGE LAP 4X18 X RAY DECT (DISPOSABLE) IMPLANT
STRIP CLOSURE SKIN 1/2X4 (GAUZE/BANDAGES/DRESSINGS) ×2 IMPLANT
SUT BONE WAX W31G (SUTURE) IMPLANT
SUT ETHIBOND 0 (SUTURE) IMPLANT
SUT ETHIBOND 2 OS 4 DA (SUTURE) IMPLANT
SUT PROLENE 3 0 PS 2 (SUTURE) ×2 IMPLANT
SUT TIGER TAPE 7 IN WHITE (SUTURE) ×2 IMPLANT
SUT VIC AB 1-0 CT2 27 (SUTURE) ×2 IMPLANT
SUT VIC AB 2-0 CT2 27 (SUTURE) ×2 IMPLANT
SUT VICRYL 0 UR6 27IN ABS (SUTURE) IMPLANT
SUT VICRYL 0-0 OS 2 NEEDLE (SUTURE) ×2 IMPLANT
TAPE FIBER 2MM 7IN #2 BLUE (SUTURE) ×2 IMPLANT

## 2013-03-06 NOTE — Progress Notes (Signed)
Utilization review completed.  

## 2013-03-06 NOTE — H&P (View-Only) (Signed)
Sheila Shields is an 78 y.o. female.   Chief Complaint: left shoulder pain HPI: left shoulder pain x 7 weeks s/p shoulder dislocation on 12/26/12 from a fall on the shoulder, closed reduction performed at [facility]. Symptoms reported today include: aching and numbness. Current treatment includes: relative rest, activity modification and pain medications. The following medication has been used for pain control: Tylenol.    Past Medical History  Diagnosis Date  . HYPERLIPIDEMIA 12/17/2006  . ANXIETY 12/18/2006  . ACUTE ALCOHOLIC INTOXICATION IN REMISSION 08/27/2007  . DEPRESSION 12/18/2006  . HYPERTENSION 12/17/2006  . COPD 12/17/2006  . GERD 05/01/2007  . ARTHRITIS 02/23/2010  . HYPERGLYCEMIA 08/27/2007  . Mood disorder   . Arthritis   . Rectal polyp 03/27/2007    adenoma  . Diverticulosis 03/27/2007    Past Surgical History  Procedure Laterality Date  . Breast biopsy      negative  . Hammer toe surgery  2008    left foot  . Colonoscopy w/ polypectomy  03/27/2007; n7/19/2012    2008: 4 mm adenoma, diverticulosis 2012: ileitis ? NSAID - likely, 2-3 mm cecal polyp LYMPHOID FOLLICLE, diverticulosis  . Esophagogastroduodenoscopy  01/26/2011    reflux esophagitis, colonscopy done also  . Appendectomy  2002  . Abdominal hysterectomy  1978    fibroma  . Carpal tunnel release Left 2010 or 2011    Family History  Problem Relation Age of Onset  . Throat cancer Mother   . Heart attack Father 31  . Idiopathic pulmonary fibrosis Brother 70  . Cancer Brother     sarcoma  . Colon cancer Neg Hx    Social History:  reports that she quit smoking about 28 years ago. Her smoking use included Cigarettes. She has a 60 pack-year smoking history. She has never used smokeless tobacco. She reports that she does not drink alcohol or use illicit drugs.  Allergies: No Known Allergies   (Not in a hospital admission)  Results for orders placed during the hospital encounter of  02/21/13 (from the past 48 hour(s))  BASIC METABOLIC PANEL     Status: Abnormal   Collection Time    02/21/13  9:20 AM      Result Value Range   Sodium 139  135 - 145 mEq/L   Potassium 3.6  3.5 - 5.1 mEq/L   Chloride 101  96 - 112 mEq/L   CO2 31  19 - 32 mEq/L   Glucose, Bld 108 (*) 70 - 99 mg/dL   BUN 21  6 - 23 mg/dL   Creatinine, Ser 8.11  0.50 - 1.10 mg/dL   Calcium 9.8  8.4 - 91.4 mg/dL   GFR calc non Af Amer 69 (*) >90 mL/min   GFR calc Af Amer 80 (*) >90 mL/min   Comment: (NOTE)     The eGFR has been calculated using the CKD EPI equation.     This calculation has not been validated in all clinical situations.     eGFR's persistently <90 mL/min signify possible Chronic Kidney     Disease.  CBC     Status: None   Collection Time    02/21/13  9:20 AM      Result Value Range   WBC 8.0  4.0 - 10.5 K/uL   RBC 4.49  3.87 - 5.11 MIL/uL   Hemoglobin 13.9  12.0 - 15.0 g/dL   HCT 78.2  95.6 - 21.3 %   MCV 90.0  78.0 -  100.0 fL   MCH 31.0  26.0 - 34.0 pg   MCHC 34.4  30.0 - 36.0 g/dL   RDW 16.1  09.6 - 04.5 %   Platelets 286  150 - 400 K/uL   Dg Chest 2 View  02/21/2013   CLINICAL DATA:  Preoperative for left shoulder surgery. History of hypertension.  EXAM: CHEST  2 VIEW  COMPARISON:  CT 08/22/2010  FINDINGS: Thoracic spondylosis noted with atherosclerotic calcification of the aortic arch. Heart size normal. The lungs appear clear. No pleural effusion. Patient unable to lift left arm on the lateral projection.  IMPRESSION: 1.  Atherosclerosis. Thoracic spondylosis. No acute findings.   Electronically Signed   By: Herbie Baltimore   On: 02/21/2013 09:48    Review of Systems  Constitutional: Negative.   HENT: Negative.   Eyes: Negative.   Respiratory: Negative.   Cardiovascular: Negative.   Gastrointestinal: Negative.   Genitourinary: Negative.   Musculoskeletal: Positive for joint pain.  Skin: Negative.   Neurological: Negative.   Endo/Heme/Allergies: Negative.    Psychiatric/Behavioral: Negative.     There were no vitals taken for this visit. Physical Exam  Constitutional: She is oriented to person, place, and time. She appears well-developed and well-nourished.  HENT:  Head: Normocephalic and atraumatic.  Eyes: Conjunctivae and EOM are normal. Pupils are equal, round, and reactive to light.  Neck: Normal range of motion. Neck supple.  Cardiovascular: Normal rate and regular rhythm.   Respiratory: Effort normal and breath sounds normal.  GI: Soft. Bowel sounds are normal.  Musculoskeletal:  On exam weak in abduction. Tender in the anterior subacromial region. She still has decreased sensation along the biceps and medial aspect of the forearm. Biceps weakness is noted.  Inspection of the shoulder revealed no ecchymosis, soft tissue swelling, or deformity. On palpation, nontender over the [facility]. On range of motion the patient had full range of motion. Provocative signs indicated no impingement sign, no sulcus sign, negative speed's test. Negative lift off.  Neurological: She is alert and oriented to person, place, and time. She has normal reflexes.  Skin: Skin is warm and dry.  Psychiatric: She has a normal mood and affect.     MRI demonstrates complete and retracted tear of the rotator cuff.  EMG/NCV studies indicate some neurapraxia of the lateral cord of the brachial plexus.  Assessment/Plan 1. Status post shoulder dislocation with lateral cord of the brachial plexus, neurapraxia. 2. Massive tear of the rotator cuff, retracted.  Discussed options with the patient. In the presence of a massive tear, it would be wise to repair the rotator cuff.  I had a long discussion with the patient concerning the risks and benefits of the proposed shoulder surgery including need for rotator cuff repair, infection, suboptimal range of motion, adhesive capsulitis, and recurrent tear requiring further surgery. We also discussed the extended recovery  requirement for postoperative physical therapy and the time to maximum recovery. I provided the patient with an illustrated handout and discussed that in detail. We also discussed anesthetic complications, DVT, PE, cardiopulmonary dysfunction, etc.  I did however, discuss the distinct possibility that given the massive tear that we may not be able to repair the tendon. Even a partial repair would be preferred. Also discussed possibility of a patch graft. Avoid traction to the upper extremity given the brachial plexus injury. Suspect that should resolve over time. There is no denervation or Horner's syndrome. We kindly appreciate Dr. Birdie Sons preoperative clearance for her. She has used  Toradol as an analgesic in the past, which has helped. She does not like Morphine. She has no MRSA exposure. Continue sling and pendulum exercises in the interim.  I had a long discussion with the patient concerning the risks and benefits of a rotator cuff repair, including bleeding, infection, prolonged postoperative recovery, which may require 3 to 5 months until maximum medical improvement. Overight procedure with initiation of early passive range of motion within physical therapy. Avoid any active motion for the first six weeks. This is all in an effort to avoid recurrent tear of the rotator cuff and adhesive capsulitis. Return to work without use of the arm can be obtained following two weeks. However, driving will be a challenge. We also discussed the possibility of requiring implants including bone anchors,as well as an Allograft patch graft if a massive rotator cuff tear is encountered. Removal of any bones for spurs as well as bursitis will be performed during the procedure and also any associated anesthetic complications as well.  Plan left shoulder mini-open RCR, SAD, possible patch graft  Maikol Grassia M. for Dr. Shelle Iron 02/21/2013, 1:57 PM

## 2013-03-06 NOTE — Anesthesia Preprocedure Evaluation (Addendum)
Anesthesia Evaluation  Patient identified by MRN, date of birth, ID band Patient awake    Reviewed: Allergy & Precautions, H&P , NPO status , Patient's Chart, lab work & pertinent test results  Airway Mallampati: II TM Distance: >3 FB Neck ROM: full    Dental no notable dental hx.    Pulmonary neg pulmonary ROS, COPD COPD inhaler,  breath sounds clear to auscultation  Pulmonary exam normal       Cardiovascular Exercise Tolerance: Good hypertension, Pt. on medications negative cardio ROS  Rhythm:regular Rate:Normal     Neuro/Psych negative neurological ROS  negative psych ROS   GI/Hepatic negative GI ROS, Neg liver ROS, GERD-  Medicated and Controlled,  Endo/Other  negative endocrine ROS  Renal/GU negative Renal ROS  negative genitourinary   Musculoskeletal   Abdominal   Peds  Hematology negative hematology ROS (+)   Anesthesia Other Findings   Reproductive/Obstetrics negative OB ROS                           Anesthesia Physical Anesthesia Plan  ASA: II  Anesthesia Plan: General   Post-op Pain Management:    Induction:   Airway Management Planned: Oral ETT  Additional Equipment:   Intra-op Plan:   Post-operative Plan: Extubation in OR  Informed Consent: I have reviewed the patients History and Physical, chart, labs and discussed the procedure including the risks, benefits and alternatives for the proposed anesthesia with the patient or authorized representative who has indicated his/her understanding and acceptance.   Dental Advisory Given  Plan Discussed with: CRNA and Surgeon  Anesthesia Plan Comments:         Anesthesia Quick Evaluation

## 2013-03-06 NOTE — Brief Op Note (Signed)
03/06/2013  12:39 PM  PATIENT:  Sheila Shields  78 y.o. female  PRE-OPERATIVE DIAGNOSIS:  LEFT SHOULDER ROTATOR CUFF TEAR  POST-OPERATIVE DIAGNOSIS:  LEFT SHOULDER ROTATOR CUFF TEAR  PROCEDURE:  Procedure(s): LEFT ROTATOR CUFF REPAIR, SUBACROMIAL DECOMPRESSION, PATCH GRAFT, MANIPULATION UNDER ANESTHESIA (Left)  SURGEON:  Surgeon(s) and Role:    * Javier Docker, MD - Primary  PHYSICIAN ASSISTANT:   ASSISTANTS: Bissell   ANESTHESIA:   general  EBL:  Total I/O In: -  Out: 50 [Blood:50]  BLOOD ADMINISTERED:none  DRAINS: none   LOCAL MEDICATIONS USED:  MARCAINE     SPECIMEN:  No Specimen  DISPOSITION OF SPECIMEN:  N/A  COUNTS:  YES  TOURNIQUET:  * No tourniquets in log *  DICTATION: .Other Dictation: Dictation Number  [id number]  PLAN OF CARE: Admit for overnight observation  PATIENT DISPOSITION:  PACU - hemodynamically stable.   Delay start of Pharmacological VTE agent (>24hrs) due to surgical blood loss or risk of bleeding: no

## 2013-03-06 NOTE — Preoperative (Signed)
Beta Blockers   Reason not to administer Beta Blockers:Not Applicable 

## 2013-03-06 NOTE — Transfer of Care (Signed)
Immediate Anesthesia Transfer of Care Note  Patient: Sheila Shields  Procedure(s) Performed: Procedure(s): LEFT ROTATOR CUFF REPAIR, SUBACROMIAL DECOMPRESSION, PATCH GRAFT, MANIPULATION UNDER ANESTHESIA (Left)  Patient Location: PACU  Anesthesia Type:General  Level of Consciousness: awake, alert  and oriented  Airway & Oxygen Therapy: Patient Spontanous Breathing and Patient connected to face mask oxygen  Post-op Assessment: Report given to PACU RN and Post -op Vital signs reviewed and stable  Post vital signs: Reviewed and stable  Complications: No apparent anesthesia complications

## 2013-03-06 NOTE — Op Note (Signed)
Sheila Shields, FELBER                 ACCOUNT NO.:  [id number]  MEDICAL RECORD NO.:  [id number]  LOCATION:  1603                         FACILITY:  [facility]  PHYSICIAN:  Jene Every, M.D.    DATE OF BIRTH:  02/16/1935  DATE OF PROCEDURE:  03/06/2013 DATE OF DISCHARGE:                              OPERATIVE REPORT   PREOPERATIVE DIAGNOSIS:  Massively retracted tear of the rotator cuff.  POSTOPERATIVE DIAGNOSIS:  Massively retracted tear of the rotator cuff.  PROCEDURE PERFORMED:  Mini open rotator cuff repair, acromioplasty of massive rotator cuff tear utilizing Arthrex swivel lock anchors.  ANESTHESIA:  General.  ASSISTANT:  Lanna Poche, PA.  He was utilized to help for retracting the upper extremity, positioning, placement of the locking devices.  HISTORY:  A 78 year old, fell to her rotator cuff retracted and also dislocated, indicated for repair though.  With it retracted, we felt that it was possible that we could not repaired to discuss risks and benefits including bleeding, infection, inability to repair, DVT, PE, anesthetic complications, etc.  TECHNIQUE:  With the patient in supine in beach-chair position.  After induction of adequate general anesthesia, 2 g Kefzol, left shoulder and upper extremity were prepped and draped in usual sterile fashion after range of motion revealed a good range of motion.  Surgical marker was utilized to delineate the acromion and AC joint.  Standard anterolateral incision was made over the end of the acromion approximately 3 cm in length.  Subcutaneous tissue was dissected.  Electrocautery was utilized to achieve hemostasis.  The raphe between the anterolateral heads was identified and divided in line with skin incision.  We exposed the anterolateral aspect of the acromion with AO elevator preserving the deltoid attachment, removed the small spur off the anterolateral aspect of the acromion.  Noted that the hypertrophic bursa was  excised.  We lavaged the joint.  The tendon was retracted supraspinatus and infraspinatus.  The cuff was mobilized superiorly, anteriorly, and posteriorly with some attenuation in all three tendons noted.  After careful mobilization, extensive mobilization utilizing the Cobb elevator, excising the bursa, I then fashioned the trough in the lateral tuberosity just lateral to the articular surface.  We removed the small portion of the lateral aspect of the articular surface to deliver the tendon to that bed as the tendon would not advance to the lateral aspect of the greater tuberosity.  I did not, however, significantly advanced it to get to the trough though.  We felt that with the status of the tendon that best utilized the Arthrex suture tape as opposed to the FiberWire, which I felt would have cut through the tendon.  I then used a single medial row swivel lock after utilization of an awl insertion of the swivel lock.  I then threaded four FiberWire tapes, passed through a Scorpion suture passer, one through the torn portion of the subscap, the supraspinatus, and the infraspinatus.  Portion of the tendons that were repairable, then slightly advanced.  This was after crossing the anterior and the posterior fiber tapes.  Then, we used two additional suture locks over the greater tuberosity after utilization of an awl to advancing the tendon and  placement of the fiber tape into the swivel locks laterally.  We inserted it appropriately, actually had good coverage of the head and contact with the cancellous bed that was prepared.  This was done with the arm in the glenohumeral joint in the located position and supported.  I then copiously irrigated the wound. Again, we felt that we had coverage, and was attenuated in torn portion of both the subscap and the infraspinatus that I felt was absent.  In terms of its integrity; however, again we were able to get a good torsion of the complex  secured by the above-mentioned technique.  Wound was copiously irrigated.  I repaired the raphe with 1 Vicryl interrupted figure-of-eight sutures over and top to the acromion, subcutaneous with 2, and skin was reapproximated with 4-0 subcuticular Prolene.  Wound was reinforced with Steri-Strips.  Sterile dressing was applied.  Placed an abduction pillow, extubated without difficulty, and transported to the recovery in satisfactory condition.  The patient tolerated the procedure well.  No complications.  Assistant, Lanna Poche, [place].  Blood loss 25 mL.  Prior to closure, we infiltrated the shoulder, deltoid with Exparel 15 mL.     Jene Every, M.D.     Cordelia Pen  D:  03/06/2013  T:  03/06/2013  Job:  [id number]

## 2013-03-06 NOTE — Interval H&P Note (Signed)
History and Physical Interval Note:  03/06/2013 7:29 AM  Sheila Shields  has presented today for surgery, with the diagnosis of ROTATOR CUFF TEAR ON LEFT  The various methods of treatment have been discussed with the patient and family. After consideration of risks, benefits and other options for treatment, the patient has consented to  Procedure(s): LEFT ROTATOR CUFF REPAIR, SUBACROMIAL DECOMPRESSION/POSSIBLE PATCH GRAFT (Left) as a surgical intervention .  The patient's history has been reviewed, patient examined, no change in status, stable for surgery.  I have reviewed the patient's chart and labs.  Questions were answered to the patient's satisfaction.     [name] C

## 2013-03-06 NOTE — Anesthesia Postprocedure Evaluation (Signed)
  Anesthesia Post-op Note  Patient: Sheila Shields  Procedure(s) Performed: Procedure(s) (LRB): LEFT ROTATOR CUFF REPAIR, SUBACROMIAL DECOMPRESSION, PATCH GRAFT, MANIPULATION UNDER ANESTHESIA (Left)  Patient Location: PACU  Anesthesia Type: General  Level of Consciousness: awake and alert   Airway and Oxygen Therapy: Patient Spontanous Breathing  Post-op Pain: mild  Post-op Assessment: Post-op Vital signs reviewed, Patient's Cardiovascular Status Stable, Respiratory Function Stable, Patent Airway and No signs of Nausea or vomiting  Last Vitals:  Filed Vitals:   03/06/13 1315  BP: 127/56  Pulse: 70  Temp:   Resp: 17    Post-op Vital Signs: stable   Complications: No apparent anesthesia complications

## 2013-03-07 ENCOUNTER — Encounter (HOSPITAL_COMMUNITY): Payer: Self-pay | Admitting: Specialist

## 2013-03-07 LAB — GLUCOSE, CAPILLARY

## 2013-03-07 MED ORDER — OXYCODONE-ACETAMINOPHEN 5-325 MG PO TABS: 1.0000 | ORAL_TABLET | ORAL | Status: DC | PRN

## 2013-03-07 NOTE — Progress Notes (Addendum)
Occupational Therapy Treatment Patient Details Name: Sheila Shields MRN: [id number] DOB: 03-31-1935 Today's Date: 03/07/2013 Time: 5284-1324 OT Time Calculation (min): 18 min  OT Assessment / Plan / Recommendation  History of present illness pt was admitted for mini open RCR on L and acromioplasty of massive RCT with anchors   OT comments    Follow Up Recommendations   (pt will follow up with dr Shelle Iron for further rehab)    Barriers to Discharge       Equipment Recommendations  None recommended by OT    Recommendations for Other Services    Frequency Min 2X/week   Progress towards OT Goals Progress towards OT goals: Goals met/education completed, patient discharged from OT  Plan      Precautions / Restrictions Precautions Precautions: Shoulder Type of Shoulder Precautions: abductor sling x bathing/dressing Restrictions Other Position/Activity Restrictions: NWB LUE   Pertinent Vitals/Pain LUE sore.      ADL    ADL Comments: husband present.  Worked through [employer] ADLs.  Pt had stretchy camisole which she has been wearing.  Able to don without moving shoulder.  Also had large shirt--poncho type, with small sewn seams at bottom of arms; able to don this without moving shoulder.  Educated on sequence as she has been putting head in first.  Now, LUE is donned first.  Husband verbalizes comfort in assisting wife, and he donned abduction sling with cues.  Pt cued to relax traps as she tends to guard.  Pt also educated to walk beside wife and guard balance due to unsteadiness earlier.      OT Diagnosis: Generalized weakness  OT Problem List: Decreased strength;Pain;Impaired UE functional use;Impaired balance (sitting and/or standing) OT Treatment Interventions: Self-care/ADL training;DME and/or AE instruction;Patient/family education;Balance training   OT Goals(current goals can now be found in the care plan section):  Goals met Acute Rehab OT Goals Patient Stated Goal: get use of LUE  back and decrease pain OT Goal Formulation: With patient Time For Goal Achievement: 03/10/13 Potential to Achieve Goals: Good ADL Goals Additional ADL Goal #1: pt will don sling with supervision Additional ADL Goal #2: Husband will verbalize understanding of and verbalize comfort with helping with adls, not moving L shoulder  Visit Information  Last OT Received On: 03/07/13 Assistance Needed: +1 History of Present Illness: pt was admitted for mini open RCR on L and acromioplasty of massive RCT with anchors    Subjective Data      Prior Functioning  Home Living Family/patient expects to be discharged to:: Private residence Living Arrangements: Spouse/significant other Prior Function Level of Independence: Independent Comments: had to make modifications as L shoulder has been painful and with limited ROM since June.   Communication Communication: No difficulties Dominant Hand: Left    Cognition  Cognition Arousal/Alertness: Awake/alert Behavior During Therapy: WFL for tasks assessed/performed Overall Cognitive Status: Within Functional Limits for tasks assessed    Mobility   Transfers Transfers: Sit to Stand Sit to Stand: 5: Supervision;From chair/3-in-1    Exercises      Balance     End of Session OT - End of Session Activity Tolerance: Patient tolerated treatment well Patient left: in chair;with call bell/phone within reach Nurse Communication:  (ready for discharge)  GO Functional Assessment Tool Used: clniical observation/judgment Functional Limitation: Self care Self Care Current Status (M0102): At least 60 percent but less than 80 percent impaired, limited or restricted Self Care Goal Status (V2536): At least 60 percent but less than 80 percent  impaired, limited or restricted Self Care Discharge Status [phone]): At least 60 percent but less than 80 percent impaired, limited or restricted   [facility] 03/07/2013, 10:21 AM Marica Otter,  OTR/L [phone] 03/07/2013

## 2013-03-07 NOTE — Progress Notes (Signed)
Discharge summary sent to payer through [employer]  

## 2013-03-07 NOTE — Discharge Summary (Signed)
Physician Discharge Summary   Patient ID: Sheila Shields MRN: [id number] DOB/AGE: [birth year] 78 y.o.  Admit date: 03/06/2013 Discharge date: 03/07/2013  Primary Diagnosis:   LEFT SHOULDER ROTATOR CUFF TEAR  Admission Diagnoses:  Past Medical History  Diagnosis Date  . HYPERLIPIDEMIA 12/17/2006  . ANXIETY 12/18/2006  . ACUTE ALCOHOLIC INTOXICATION IN REMISSION 08/27/2007  . DEPRESSION 12/18/2006  . HYPERTENSION 12/17/2006  . COPD 12/17/2006  . GERD 05/01/2007  . ARTHRITIS 02/23/2010  . HYPERGLYCEMIA 08/27/2007  . Mood disorder   . Arthritis   . Rectal polyp 03/27/2007    adenoma  . Diverticulosis 03/27/2007   Discharge Diagnoses:   Principal Problem:   Left rotator cuff tear  Procedure:  Procedure(s) (LRB): LEFT ROTATOR CUFF REPAIR, SUBACROMIAL DECOMPRESSION, PATCH GRAFT, MANIPULATION UNDER ANESTHESIA (Left)   Consults: None  HPI:  see H&P    Laboratory Data: Hospital Outpatient Visit on 02/21/2013  Component Date Value Range Status  . Sodium 02/21/2013 139  135 - 145 mEq/L Final  . Potassium 02/21/2013 3.6  3.5 - 5.1 mEq/L Final  . Chloride 02/21/2013 101  96 - 112 mEq/L Final  . CO2 02/21/2013 31  19 - 32 mEq/L Final  . Glucose, Bld 02/21/2013 108* 70 - 99 mg/dL Final  . BUN 40/98/1191 21  6 - 23 mg/dL Final  . Creatinine, Ser 02/21/2013 0.81  0.50 - 1.10 mg/dL Final  . Calcium 47/82/9562 9.8  8.4 - 10.5 mg/dL Final  . GFR calc non Af Amer 02/21/2013 69* >90 mL/min Final  . GFR calc Af Amer 02/21/2013 80* >90 mL/min Final   Comment: (NOTE)                          The eGFR has been calculated using the CKD EPI equation.                          This calculation has not been validated in all clinical situations.                          eGFR's persistently <90 mL/min signify possible Chronic Kidney                          Disease.  . WBC 02/21/2013 8.0  4.0 - 10.5 K/uL Final  . RBC 02/21/2013 4.49  3.87 - 5.11 MIL/uL Final  . Hemoglobin 02/21/2013 13.9  12.0 - 15.0  g/dL Final  . HCT 13/02/6577 40.4  36.0 - 46.0 % Final  . MCV 02/21/2013 90.0  78.0 - 100.0 fL Final  . MCH 02/21/2013 31.0  26.0 - 34.0 pg Final  . MCHC 02/21/2013 34.4  30.0 - 36.0 g/dL Final  . RDW 46/96/2952 13.2  11.5 - 15.5 % Final  . Platelets 02/21/2013 286  150 - 400 K/uL Final    Recent Labs  03/06/13 1449  HGB 12.8    Recent Labs  03/06/13 1449  WBC 9.8  RBC 4.18  HCT 37.7  PLT 250    Recent Labs  03/06/13 1449  CREATININE 0.80   No results found for this basename: LABPT, INR,  in the last 72 hours  X-Rays:Dg Chest 2 View  02/21/2013   CLINICAL DATA:  Preoperative for left shoulder surgery. History of hypertension.  EXAM: CHEST  2 VIEW  COMPARISON:  CT 08/22/2010  FINDINGS: Thoracic spondylosis noted  with atherosclerotic calcification of the aortic arch. Heart size normal. The lungs appear clear. No pleural effusion. Patient unable to lift left arm on the lateral projection.  IMPRESSION: 1.  Atherosclerosis. Thoracic spondylosis. No acute findings.   Electronically Signed   By: Herbie Baltimore   On: 02/21/2013 09:48    EKG: Orders placed in visit on 02/18/13  . EKG 12-LEAD     Hospital Course: Patient was admitted to [facility] and taken to the OR and underwent the above state procedure without complications.  Patient tolerated the procedure well and was later transferred to the recovery room and then to the orthopaedic floor for postoperative care.  They were given PO and IV analgesics for pain control following their surgery.  They were given 24 hours of postoperative antibiotics.   OT/PT was consulted postop to assist with mobility and transfers as well as PROM exercises. Patient had a good night on the evening of surgery and started to get up OOB with therapy on day one. Patient was seen in rounds and was ready to go home on day one.  They were given discharge instructions and dressing directions.  They were instructed on when to follow up in the  office with Dr. Shelle Iron.  Discharge Medications: Prior to Admission medications   Medication Sig Start Date End Date Taking? Authorizing Provider  diclofenac (VOLTAREN) 50 MG EC tablet Take 1 tablet (50 mg total) by mouth 2 (two) times daily. 12/12/12  Yes Lindley Magnus, MD  lansoprazole (PREVACID) 15 MG capsule Take 15 mg by mouth daily.   Yes Historical Provider, MD  losartan-hydrochlorothiazide (HYZAAR) 100-25 MG per tablet Take 1 tablet by mouth every morning.   Yes Historical Provider, MD  potassium chloride SA (K-DUR,KLOR-CON) 20 MEQ tablet Take 1 tablet (20 mEq total) by mouth daily. 09/14/12  Yes Lindley Magnus, MD  simvastatin (ZOCOR) 20 MG tablet Take 20 mg by mouth every morning.   Yes Historical Provider, MD  albuterol (PROVENTIL HFA;VENTOLIN HFA) 108 (90 BASE) MCG/ACT inhaler Inhale 2 puffs into the lungs every 6 (six) hours as needed for wheezing. 12/17/12   Lindley Magnus, MD  aspirin EC 81 MG tablet Take 81 mg by mouth daily.    Historical Provider, MD  Calcium Carbonate-Vitamin D (CALTRATE 600+D) 600-400 MG-UNIT per tablet Take 1 tablet by mouth 2 (two) times daily.     Historical Provider, MD  ferrous sulfate 325 (65 FE) MG tablet Take 325 mg by mouth daily with breakfast.    Historical Provider, MD  fish oil-omega-3 fatty acids 1000 MG capsule Take 2 g by mouth daily.    Historical Provider, MD  misoprostol (CYTOTEC) 200 MCG tablet Take 1 tablet (200 mcg total) by mouth 2 (two) times daily. 12/12/12   Lindley Magnus, MD  Multiple Vitamin (MULTIVITAMIN WITH MINERALS) TABS tablet Take 1 tablet by mouth daily.    Historical Provider, MD  oxyCODONE-acetaminophen (PERCOCET/ROXICET) 5-325 MG per tablet Take 1-2 tablets by mouth every 4 (four) hours as needed. 03/07/13   Dayna Barker. Verdella Laidlaw, PA-C  traMADol (ULTRAM) 50 MG tablet Take 1 tablet (50 mg total) by mouth every 6 (six) hours as needed for pain. 03/06/13   Dayna Barker. Christene Lye, PA-C    Diet: Regular diet Activity:WBAT Follow-up:in 10-14  days Disposition - Home Discharged Condition: good   Discharge Orders   Future Appointments Provider Department Dept Phone   09/03/2013 8:00 AM Lindley Magnus, MD [facility] [phone]  Future Orders Complete By Expires   Call MD / Call 911  As directed    Comments:     If you experience chest pain or shortness of breath, CALL 911 and be transported to the hospital emergency room.  If you develope a fever above 101 F, pus (white drainage) or increased drainage or redness at the wound, or calf pain, call your surgeon's office.   Constipation Prevention  As directed    Comments:     Drink plenty of fluids.  Prune juice may be helpful.  You may use a stool softener, such as Colace (over the counter) 100 mg twice a day.  Use MiraLax (over the counter) for constipation as needed.   Diet - low sodium heart healthy  As directed    Increase activity slowly as tolerated  As directed        Medication List         albuterol 108 (90 BASE) MCG/ACT inhaler  Commonly known as:  PROVENTIL HFA;VENTOLIN HFA  Inhale 2 puffs into the lungs every 6 (six) hours as needed for wheezing.     aspirin EC 81 MG tablet  Take 81 mg by mouth daily.     CALTRATE 600+D 600-400 MG-UNIT per tablet  Generic drug:  Calcium Carbonate-Vitamin D  Take 1 tablet by mouth 2 (two) times daily.     diclofenac 50 MG EC tablet  Commonly known as:  VOLTAREN  Take 1 tablet (50 mg total) by mouth 2 (two) times daily.     ferrous sulfate 325 (65 FE) MG tablet  Take 325 mg by mouth daily with breakfast.     fish oil-omega-3 fatty acids 1000 MG capsule  Take 2 g by mouth daily.     lansoprazole 15 MG capsule  Commonly known as:  PREVACID  Take 15 mg by mouth daily.     losartan-hydrochlorothiazide 100-25 MG per tablet  Commonly known as:  HYZAAR  Take 1 tablet by mouth every morning.     misoprostol 200 MCG tablet  Commonly known as:  CYTOTEC  Take 1 tablet (200 mcg total) by mouth 2 (two)  times daily.     multivitamin with minerals Tabs tablet  Take 1 tablet by mouth daily.     oxyCODONE-acetaminophen 5-325 MG per tablet  Commonly known as:  PERCOCET/ROXICET  Take 1-2 tablets by mouth every 4 (four) hours as needed.     potassium chloride SA 20 MEQ tablet  Commonly known as:  K-DUR,KLOR-CON  Take 1 tablet (20 mEq total) by mouth daily.     simvastatin 20 MG tablet  Commonly known as:  ZOCOR  Take 20 mg by mouth every morning.     traMADol 50 MG tablet  Commonly known as:  ULTRAM  Take 1 tablet (50 mg total) by mouth every 6 (six) hours as needed for pain.           Follow-up Information   Follow up with BEANE,JEFFREY C, MD In 2 weeks.   Specialty:  Orthopedic Surgery   Contact information:   [address] [place] [address] [phone]       Signed: Dorothy Spark. 03/07/2013, 8:29 AM

## 2013-03-07 NOTE — Progress Notes (Signed)
Subjective: 1 Day Post-Op Procedure(s) (LRB): LEFT ROTATOR CUFF REPAIR, SUBACROMIAL DECOMPRESSION, PATCH GRAFT, MANIPULATION UNDER ANESTHESIA (Left) Patient reports pain as mild.  Feeling comfortable, pain controlled with Percocet. No radiating arm pain, numbness, or tingling. No other c/o. Feeling ready to go home today.  Objective: Vital signs in last 24 hours: Temp:  [97.6 F (36.4 C)-98.8 F (37.1 C)] 98.2 F (36.8 C) (08/29 9604) Pulse Rate:  [63-90] 79 (08/29 0638) Resp:  [10-17] 16 (08/29 0638) BP: (118-155)/(41-86) 133/73 mmHg (08/29 0638) SpO2:  [92 %-99 %] 92 % (08/29 5409) Weight:  [88.9 kg (195 lb 15.8 oz)] 88.9 kg (195 lb 15.8 oz) (08/28 1352)  Intake/Output from previous day: 08/28 0701 - 08/29 0700 In: 2390 [P.O.:480; I.V.:1910] Out: 1100 [Urine:1050; Blood:50] Intake/Output this shift:     Recent Labs  03/06/13 1449  HGB 12.8    Recent Labs  03/06/13 1449  WBC 9.8  RBC 4.18  HCT 37.7  PLT 250    Recent Labs  03/06/13 1449  CREATININE 0.80   No results found for this basename: LABPT, INR,  in the last 72 hours  Neurologically intact ABD soft Neurovascular intact Sensation intact distally Intact pulses distally Dorsiflexion/Plantar flexion intact Incision: dressing C/D/I and no drainage No cellulitis present Compartment soft  Assessment/Plan: 1 Day Post-Op Procedure(s) (LRB): LEFT ROTATOR CUFF REPAIR, SUBACROMIAL DECOMPRESSION, PATCH GRAFT, MANIPULATION UNDER ANESTHESIA (Left) Advance diet Up with therapy Discussed D/C instructions, restrictions, use of abductor pillow sling, passive motion pendulum exercises D/C home today, will give Rx for Percocet and Ultram for pain Follow up with Dr. Shelle Iron in 10-14 days for suture removal Will discuss with Dr. Elissa Lovett, Dayna Barker. 03/07/2013, 7:50 AM

## 2013-03-07 NOTE — Evaluation (Addendum)
Occupational Therapy Evaluation Patient Details Name: Sheila Shields MRN: [id number] DOB: [birth year] Today's Date: 03/07/2013 Time: 4098-1191 OT Time Calculation (min): 19 min  OT Assessment / Plan / Recommendation History of present illness pt was admitted for mini open RCR on L and acromioplasty of massive RCT with anchors   Clinical Impression   Pt was admitted for the above procedure.  Will plan to see her one more time in acute to reinforce education and to ensure husband is comfortable with sling/helping with adls.     OT Assessment  Patient needs continued OT Services    Follow Up Recommendations   (pt will follow up with dr beane for further rehab)    Barriers to Discharge      Equipment Recommendations  None recommended by OT    Recommendations for Other Services    Frequency  Min 2X/week    Precautions / Restrictions Precautions Precautions: Shoulder Type of Shoulder Precautions: abductor sling x bathing/dressing Restrictions Other Position/Activity Restrictions: NWB LUE   Pertinent Vitals/Pain L shoulder sore; premedicated, repositioned, and ice just removed    ADL  Grooming: Wash/dry hands;Supervision/safety Where Assessed - Grooming: Unsupported standing Upper Body Bathing: Moderate assistance Where Assessed - Upper Body Bathing: Unsupported sitting Lower Body Bathing: Moderate assistance Where Assessed - Lower Body Bathing: Unsupported sit to stand Upper Body Dressing: Moderate assistance Where Assessed - Upper Body Dressing: Unsupported sitting Lower Body Dressing: Maximal assistance Where Assessed - Lower Body Dressing: Unsupported sit to stand Toilet Transfer: Min Pension scheme manager Method: Sit to Barista: Comfort height toilet;Grab bars Toileting - Clothing Manipulation and Hygiene: Set up Where Assessed - Engineer, mining and Hygiene: Sit on 3-in-1 or toilet Transfers/Ambulation Related to ADLs: pt slightly  unsteady ambulating to bathroom but no LOB.  Pt feels this is from medications ADL Comments: pt has been used to adapting for LUE pain.  Educated on shoulder protocol and adls.  Educated that she will have to think about what she is doing to avoid shoulder movement as she likely moved a little prior to surgery.  Will come back when husband is here and have him practice sling.  Husband can be with her all the time.     OT Diagnosis: Generalized weakness  OT Problem List: Decreased strength;Pain;Impaired UE functional use;Impaired balance (sitting and/or standing) OT Treatment Interventions: Self-care/ADL training;DME and/or AE instruction;Patient/family education;Balance training   OT Goals(Current goals can be found in the care plan section) Acute Rehab OT Goals Patient Stated Goal: get use of LUE back and decrease pain OT Goal Formulation: With patient Time For Goal Achievement: 03/10/13 Potential to Achieve Goals: Good ADL Goals Additional ADL Goal #1: pt will don sling with supervision Additional ADL Goal #2: Husband will verbalize understanding of and verbalize comfort with helping with adls, not moving L shoulder  Visit Information  Last OT Received On: 03/07/13 Assistance Needed: +1 History of Present Illness: pt was admitted for mini open RCR on L and acromioplasty of massive RCT with anchors       Prior Functioning     Home Living Family/patient expects to be discharged to:: Private residence Living Arrangements: Spouse/significant other Prior Function Level of Independence: Independent Comments: had to make modifications as L shoulder has been painful and with limited ROM since June.   Communication Communication: No difficulties Dominant Hand: Left         Vision/Perception     Cognition  Cognition Arousal/Alertness: Awake/alert Behavior During Therapy: WFL for  tasks assessed/performed Overall Cognitive Status: Within Functional Limits for tasks assessed     Extremity/Trunk Assessment Upper Extremity Assessment Upper Extremity Assessment: LUE deficits/detail LUE Deficits / Details: NT due to mobilization.  Able to move fingers     Mobility Bed Mobility Bed Mobility: Supine to Sit Supine to Sit: 4: Min assist Transfers Transfers: Sit to Stand Sit to Stand: 5: Supervision     Exercise     Balance     End of Session OT - End of Session Activity Tolerance: Patient tolerated treatment well Patient left: in chair;with call bell/phone within reach  GO Functional Assessment Tool Used: clniical observation/judgment Functional Limitation: Self care Self Care Current Status (Z6109): At least 60 percent but less than 80 percent impaired, limited or restricted Self Care Goal Status (U0454): At least 60 percent but less than 80 percent impaired, limited or restricted   [facility] 03/07/2013, 8:54 AM Marica Otter, OTR/L [phone] 03/07/2013

## 2013-03-17 ENCOUNTER — Ambulatory Visit: Payer: Medicare Other | Admitting: Internal Medicine

## 2013-03-24 ENCOUNTER — Ambulatory Visit: Payer: Medicare Other | Attending: Specialist

## 2013-03-24 DIAGNOSIS — M25619 Stiffness of unspecified shoulder, not elsewhere classified: Secondary | ICD-10-CM | POA: Insufficient documentation

## 2013-03-24 DIAGNOSIS — M6281 Muscle weakness (generalized): Secondary | ICD-10-CM | POA: Insufficient documentation

## 2013-03-24 DIAGNOSIS — IMO0001 Reserved for inherently not codable concepts without codable children: Secondary | ICD-10-CM | POA: Insufficient documentation

## 2013-03-24 DIAGNOSIS — R5381 Other malaise: Secondary | ICD-10-CM | POA: Insufficient documentation

## 2013-03-24 DIAGNOSIS — M25519 Pain in unspecified shoulder: Secondary | ICD-10-CM | POA: Insufficient documentation

## 2013-03-26 ENCOUNTER — Ambulatory Visit: Payer: Medicare Other | Admitting: Physical Therapy

## 2013-03-31 ENCOUNTER — Ambulatory Visit: Payer: Medicare Other | Admitting: Physical Therapy

## 2013-04-02 ENCOUNTER — Ambulatory Visit: Payer: Medicare Other | Admitting: Physical Therapy

## 2013-04-07 ENCOUNTER — Ambulatory Visit: Payer: Medicare Other | Admitting: Physical Therapy

## 2013-04-09 ENCOUNTER — Ambulatory Visit: Payer: Medicare Other | Attending: Specialist | Admitting: Physical Therapy

## 2013-04-09 DIAGNOSIS — M6281 Muscle weakness (generalized): Secondary | ICD-10-CM | POA: Insufficient documentation

## 2013-04-09 DIAGNOSIS — M25619 Stiffness of unspecified shoulder, not elsewhere classified: Secondary | ICD-10-CM | POA: Insufficient documentation

## 2013-04-09 DIAGNOSIS — M25519 Pain in unspecified shoulder: Secondary | ICD-10-CM | POA: Insufficient documentation

## 2013-04-09 DIAGNOSIS — IMO0001 Reserved for inherently not codable concepts without codable children: Secondary | ICD-10-CM | POA: Insufficient documentation

## 2013-04-09 DIAGNOSIS — R5381 Other malaise: Secondary | ICD-10-CM | POA: Insufficient documentation

## 2013-04-14 ENCOUNTER — Ambulatory Visit: Payer: Medicare Other | Admitting: Physical Therapy

## 2013-04-16 ENCOUNTER — Ambulatory Visit: Payer: Medicare Other | Admitting: Physical Therapy

## 2013-04-21 ENCOUNTER — Ambulatory Visit: Payer: Medicare Other

## 2013-04-22 ENCOUNTER — Encounter: Payer: Self-pay | Admitting: Internal Medicine

## 2013-04-23 ENCOUNTER — Ambulatory Visit: Payer: Medicare Other | Admitting: Physical Therapy

## 2013-04-28 ENCOUNTER — Ambulatory Visit: Payer: Medicare Other

## 2013-04-29 ENCOUNTER — Ambulatory Visit (INDEPENDENT_AMBULATORY_CARE_PROVIDER_SITE_OTHER): Payer: Medicare Other | Admitting: Family

## 2013-04-29 ENCOUNTER — Encounter: Payer: Self-pay | Admitting: Family

## 2013-04-29 VITALS — BP 118/64 | HR 83 | Wt 195.0 lb

## 2013-04-29 DIAGNOSIS — H6120 Impacted cerumen, unspecified ear: Secondary | ICD-10-CM

## 2013-04-29 DIAGNOSIS — G43109 Migraine with aura, not intractable, without status migrainosus: Secondary | ICD-10-CM

## 2013-04-29 DIAGNOSIS — H6123 Impacted cerumen, bilateral: Secondary | ICD-10-CM

## 2013-04-29 DIAGNOSIS — J3489 Other specified disorders of nose and nasal sinuses: Secondary | ICD-10-CM

## 2013-04-29 MED ORDER — MUPIROCIN 2 % EX OINT: TOPICAL_OINTMENT | Freq: Three times a day (TID) | CUTANEOUS | Status: DC

## 2013-04-29 NOTE — Progress Notes (Signed)
Subjective:    Patient ID: Sheila Shields, female    DOB: October 09, 1934, 78 y.o.   MRN: [id number]  HPI 78 year old white female, patient of Dr. Cato Mulligan today with complaints of a sore in her left nare x 1 month off and on. Reports she's been picking the area has been difficult for her to heal. However, she thinks it may be gone now. It is not tender or bleeding. She has a history of melanoma and therefore is having it checked today.  Patient reports having migraine aura without headache since changing from eyeglasses to contact lenses. She's been on contact lenses x2-3 months and has noticed that she had squiggly lines that appear approximately once every 2-3 weeks without pain. The symptoms typically resolve in approximately one hour. No nausea vomiting, no sensitivity to light or noise. She has a history of migraine headaches in the past   Review of Systems  Constitutional: Negative.   HENT: Negative.  Negative for ear discharge.        Left nare sore  Respiratory: Negative.   Cardiovascular: Negative.   Gastrointestinal: Negative.   Endocrine: Negative.   Musculoskeletal: Negative.   Skin: Positive for wound.       Sore in the left nare.   Allergic/Immunologic: Negative.   Neurological: Negative.   Psychiatric/Behavioral: Negative.    Past Medical History  Diagnosis Date  . HYPERLIPIDEMIA 12/17/2006  . ANXIETY 12/18/2006  . ACUTE ALCOHOLIC INTOXICATION IN REMISSION 08/27/2007  . DEPRESSION 12/18/2006  . HYPERTENSION 12/17/2006  . COPD 12/17/2006  . GERD 05/01/2007  . ARTHRITIS 02/23/2010  . HYPERGLYCEMIA 08/27/2007  . Mood disorder   . Arthritis   . Rectal polyp 03/27/2007    adenoma  . Diverticulosis 03/27/2007    History   Social History  . Marital Status: Married    Spouse Name: N/A    Number of Children: 2  . Years of Education: N/A   Occupational History  . retired    Social History Main Topics  . Smoking status: Former Smoker -- 2.00 packs/day for 30 years    Types:  Cigarettes    Quit date: 07/10/1984  . Smokeless tobacco: Never Used  . Alcohol Use: No     Comment: no alcoho since 1983  . Drug Use: No  . Sexual Activity: Not on file   Other Topics Concern  . Not on file   Social History Narrative  . No narrative on file    Past Surgical History  Procedure Laterality Date  . Breast biopsy      negative  . Hammer toe surgery  2008    left foot  . Colonoscopy w/ polypectomy  03/27/2007; n7/19/2012    2008: 4 mm adenoma, diverticulosis 2012: ileitis ? NSAID - likely, 2-3 mm cecal polyp LYMPHOID FOLLICLE, diverticulosis  . Esophagogastroduodenoscopy  01/26/2011    reflux esophagitis, colonscopy done also  . Appendectomy  2002  . Abdominal hysterectomy  1978    fibroma  . Carpal tunnel release Left 2010 or 2011  . Shoulder open rotator cuff repair Left 03/06/2013    Procedure: LEFT ROTATOR CUFF REPAIR, SUBACROMIAL DECOMPRESSION, PATCH GRAFT, MANIPULATION UNDER ANESTHESIA;  Surgeon: Javier Docker, MD;  Location: [facility];  Service: Orthopedics;  Laterality: Left;    Family History  Problem Relation Age of Onset  . Throat cancer Mother   . Heart attack Father 29  . Idiopathic pulmonary fibrosis Brother 70  . Cancer Brother     sarcoma  .  Colon cancer Neg Hx     Allergies  Allergen Reactions  . Dilaudid [Hydromorphone Hcl] Other (See Comments)    Pt had hypotension after dilaudid 1mg  IV while in the OR, required temporary pressor intervention.    Current Outpatient Prescriptions on File Prior to Visit  Medication Sig Dispense Refill  . albuterol (PROVENTIL HFA;VENTOLIN HFA) 108 (90 BASE) MCG/ACT inhaler Inhale 2 puffs into the lungs every 6 (six) hours as needed for wheezing.  1 Inhaler  5  . aspirin EC 81 MG tablet Take 81 mg by mouth daily.      . Calcium Carbonate-Vitamin D (CALTRATE 600+D) 600-400 MG-UNIT per tablet Take 1 tablet by mouth 2 (two) times daily.       . diclofenac (VOLTAREN) 50 MG EC tablet Take 1 tablet (50 mg total)  by mouth 2 (two) times daily.  180 tablet  2  . ferrous sulfate 325 (65 FE) MG tablet Take 325 mg by mouth daily with breakfast.      . fish oil-omega-3 fatty acids 1000 MG capsule Take 2 g by mouth daily.      . lansoprazole (PREVACID) 15 MG capsule Take 15 mg by mouth daily.      Marland Kitchen losartan-hydrochlorothiazide (HYZAAR) 100-25 MG per tablet Take 1 tablet by mouth every morning.      . misoprostol (CYTOTEC) 200 MCG tablet Take 1 tablet (200 mcg total) by mouth 2 (two) times daily.  180 tablet  2  . Multiple Vitamin (MULTIVITAMIN WITH MINERALS) TABS tablet Take 1 tablet by mouth daily.      Marland Kitchen oxyCODONE-acetaminophen (PERCOCET/ROXICET) 5-325 MG per tablet Take 1-2 tablets by mouth every 4 (four) hours as needed.  40 tablet  0  . potassium chloride SA (K-DUR,KLOR-CON) 20 MEQ tablet Take 1 tablet (20 mEq total) by mouth daily.  90 tablet  3  . simvastatin (ZOCOR) 20 MG tablet Take 20 mg by mouth every morning.      . traMADol (ULTRAM) 50 MG tablet Take 1 tablet (50 mg total) by mouth every 6 (six) hours as needed for pain.  40 tablet  0   No current facility-administered medications on file prior to visit.    BP 118/64  Pulse 83  Wt 195 lb (88.451 kg)  BMI 31.34 kg/m2chart    Objective:   Physical Exam  Constitutional: She is oriented to person, place, and time. She appears well-developed and well-nourished.  HENT:  Nose: Nose normal.  Mouth/Throat: Oropharynx is clear and moist.  Cerumen impaction bilaterally  Neck: Normal range of motion. Neck supple.  Cardiovascular: Normal rate, regular rhythm and normal heart sounds.   Pulmonary/Chest: Effort normal and breath sounds normal.  Abdominal: Soft. Bowel sounds are normal. There is no tenderness. There is no rebound.  Musculoskeletal: Normal range of motion.  Neurological: She is alert and oriented to person, place, and time.  Skin: Skin is warm and dry.  Psychiatric: She has a normal mood and affect.          Assessment & Plan:   Assessment: 1. Sore left Nare 2. Migraine with aura no headache 3. Cerumen impaction  Plan: Bactroban nasal plasty the affected area when the sore her ears in her nose. There is no visible lesion right now. If migraines increased in frequency but office know so that we can consider treatmen Recheck a schedule, and admitted.t.

## 2013-04-29 NOTE — Patient Instructions (Signed)
Cerumen Impaction A cerumen impaction is when the wax in your ear forms a plug. This plug usually causes reduced hearing. Sometimes it also causes an earache or dizziness. Removing a cerumen impaction can be difficult and painful. The wax sticks to the ear canal. The canal is sensitive and bleeds easily. If you try to remove a heavy wax buildup with a cotton tipped swab, you may push it in further. Irrigation with water, suction, and small ear curettes may be used to clear out the wax. If the impaction is fixed to the skin in the ear canal, ear drops may be needed for a few days to loosen the wax. People who build up a lot of wax frequently can use ear wax removal products available in your local drugstore. SEEK MEDICAL CARE IF:  You develop an earache, increased hearing loss, or marked dizziness. Document Released: 08/03/2004 Document Revised: 09/18/2011 Document Reviewed: 09/23/2009 Exit[employer] Patient Information 2014 [employer].  

## 2013-04-30 ENCOUNTER — Ambulatory Visit: Payer: Medicare Other | Admitting: Physical Therapy

## 2013-05-05 ENCOUNTER — Ambulatory Visit: Payer: Medicare Other | Admitting: Physical Therapy

## 2013-05-07 ENCOUNTER — Ambulatory Visit: Payer: Medicare Other | Admitting: Physical Therapy

## 2013-05-12 ENCOUNTER — Ambulatory Visit: Payer: Medicare Other | Attending: Specialist | Admitting: Physical Therapy

## 2013-05-12 DIAGNOSIS — IMO0001 Reserved for inherently not codable concepts without codable children: Secondary | ICD-10-CM | POA: Insufficient documentation

## 2013-05-12 DIAGNOSIS — M6281 Muscle weakness (generalized): Secondary | ICD-10-CM | POA: Insufficient documentation

## 2013-05-12 DIAGNOSIS — R5381 Other malaise: Secondary | ICD-10-CM | POA: Insufficient documentation

## 2013-05-12 DIAGNOSIS — M25619 Stiffness of unspecified shoulder, not elsewhere classified: Secondary | ICD-10-CM | POA: Insufficient documentation

## 2013-05-12 DIAGNOSIS — M25519 Pain in unspecified shoulder: Secondary | ICD-10-CM | POA: Insufficient documentation

## 2013-05-14 ENCOUNTER — Ambulatory Visit: Payer: Medicare Other | Admitting: Physical Therapy

## 2013-05-21 ENCOUNTER — Ambulatory Visit: Payer: Medicare Other | Admitting: Physical Therapy

## 2013-05-26 ENCOUNTER — Ambulatory Visit: Payer: Medicare Other | Admitting: Physical Therapy

## 2013-05-28 ENCOUNTER — Ambulatory Visit: Payer: Medicare Other | Admitting: Physical Therapy

## 2013-06-10 ENCOUNTER — Encounter: Payer: Medicare Other | Admitting: Physical Therapy

## 2013-06-12 ENCOUNTER — Ambulatory Visit: Payer: Medicare Other | Attending: Specialist | Admitting: Physical Therapy

## 2013-06-12 ENCOUNTER — Other Ambulatory Visit: Payer: Self-pay | Admitting: Internal Medicine

## 2013-06-12 DIAGNOSIS — M25519 Pain in unspecified shoulder: Secondary | ICD-10-CM | POA: Insufficient documentation

## 2013-06-12 DIAGNOSIS — R5381 Other malaise: Secondary | ICD-10-CM | POA: Insufficient documentation

## 2013-06-12 DIAGNOSIS — M25619 Stiffness of unspecified shoulder, not elsewhere classified: Secondary | ICD-10-CM | POA: Insufficient documentation

## 2013-06-12 DIAGNOSIS — IMO0001 Reserved for inherently not codable concepts without codable children: Secondary | ICD-10-CM | POA: Insufficient documentation

## 2013-06-12 DIAGNOSIS — M6281 Muscle weakness (generalized): Secondary | ICD-10-CM | POA: Insufficient documentation

## 2013-06-16 ENCOUNTER — Ambulatory Visit: Payer: Medicare Other | Admitting: Physical Therapy

## 2013-06-18 ENCOUNTER — Ambulatory Visit: Payer: Medicare Other

## 2013-06-23 ENCOUNTER — Encounter: Payer: Medicare Other | Admitting: Physical Therapy

## 2013-06-25 ENCOUNTER — Ambulatory Visit: Payer: Medicare Other

## 2013-08-06 ENCOUNTER — Telehealth: Payer: Self-pay | Admitting: Internal Medicine

## 2013-08-06 NOTE — Telephone Encounter (Signed)
Patient Information:  Caller Name: Jasalyn  Phone: [phone]  Patient: Sheila Shields, Sheila Shields  Gender: Female  DOB: 11/24/1934  Age: 79 Years  PCP: Phoebe Sharps (Adults only)  Office Follow Up:  Does the office need to follow up with this patient?: Yes  Instructions For The Office: Please advised if she needs to be seen today 08/06/12. No appointments available. She is not in respiratory distress. She will start Albuterol every 6 hours and has appnt on 08/07/13.    Symptoms  Reason For Call & Symptoms: Hx of COPD and started with congestion on 08/02/13 and now she feels like it has moved to her chest. She has wheezy mostly non-productive cough and is taking Muscinex and not helping. She is tired and sleeping 10-12 hours/day. Afebrile. She has not been using Inhaler. Advised to start inhaler 2 puffs every 6 hours as advised and appointment scheduled for 08/07/13.   Reviewed Health History In EMR: Yes  Reviewed Medications In EMR: Yes  Reviewed Allergies In EMR: Yes  Reviewed Surgeries / Procedures: Yes  Date of Onset of Symptoms: 08/02/2013  Guideline(s) Used:  Cough  Disposition Per Guideline:   Go to Office Now  Reason For Disposition Reached:   Wheezing is present  Advice Given:  Cough Medicines:  OTC Cough Drops: Cough drops can help a lot, especially for mild coughs. They reduce coughing by soothing your irritated throat and removing that tickle sensation in the back of the throat. Cough drops also have the advantage of portability - you can carry them with you.  Home Remedy - Honey: This old home remedy has been shown to help decrease coughing at night. The adult dosage is 2 teaspoons (10 ml) at bedtime. Honey should not be given to infants under one year of age.  Coughing Spasms:  Drink warm fluids. Inhale warm mist (Reason: both relax the airway and loosen up the phlegm).  Suck on cough drops or hard candy to coat the irritated throat.  Prevent Dehydration:  Drink adequate liquids.  This will help soothe an irritated or dry throat and loosen up the phlegm.  Call Back If:  Difficulty breathing  Cough lasts more than 3 weeks  Fever lasts > 3 days  You become worse.  Expected Course:   The expected course depends on what is causing the cough.  Viral bronchitis (chest cold) causes a cough that lasts 1 to 3 weeks. Sometimes you may cough up lots of phlegm (sputum, mucus). The mucus can normally be white, gray, yellow, or green.  Patient Will Follow Care Advice:  YES  Appointment Scheduled:  08/07/2013 11:15:00 Appointment Scheduled Provider:  Roxy Cedar [facility])

## 2013-08-07 ENCOUNTER — Encounter: Payer: Self-pay | Admitting: Family

## 2013-08-07 ENCOUNTER — Ambulatory Visit (INDEPENDENT_AMBULATORY_CARE_PROVIDER_SITE_OTHER): Payer: Medicare HMO | Admitting: Family

## 2013-08-07 VITALS — BP 122/82 | HR 97 | Temp 98.3°F | Ht 66.0 in | Wt 198.0 lb

## 2013-08-07 DIAGNOSIS — J209 Acute bronchitis, unspecified: Secondary | ICD-10-CM

## 2013-08-07 DIAGNOSIS — R062 Wheezing: Secondary | ICD-10-CM

## 2013-08-07 MED ORDER — ALBUTEROL SULFATE (2.5 MG/3ML) 0.083% IN NEBU
2.5000 mg | INHALATION_SOLUTION | Freq: Once | RESPIRATORY_TRACT | Status: AC
  Administered 2013-08-07: 2.5 mg via RESPIRATORY_TRACT

## 2013-08-07 MED ORDER — METHYLPREDNISOLONE ACETATE 40 MG/ML IJ SUSP
80.0000 mg | Freq: Once | INTRAMUSCULAR | Status: AC
  Administered 2013-08-07: 80 mg via INTRAMUSCULAR

## 2013-08-07 MED ORDER — PREDNISONE 20 MG PO TABS: ORAL_TABLET | ORAL | Status: AC

## 2013-08-07 NOTE — Progress Notes (Signed)
Pre visit review using our clinic review tool, if applicable. No additional management support is needed unless otherwise documented below in the visit note. 

## 2013-08-07 NOTE — Patient Instructions (Signed)
Acute Bronchitis Bronchitis is inflammation of the airways that extend from the windpipe into the lungs (bronchi). The inflammation often causes mucus to develop. This leads to a cough, which is the most common symptom of bronchitis.  In acute bronchitis, the condition usually develops suddenly and goes away over time, usually in a couple weeks. Smoking, allergies, and asthma can make bronchitis worse. Repeated episodes of bronchitis may cause further lung problems.  CAUSES Acute bronchitis is most often caused by the same virus that causes a cold. The virus can spread from person to person (contagious).  SIGNS AND SYMPTOMS   Cough.   Fever.   Coughing up mucus.   Body aches.   Chest congestion.   Chills.   Shortness of breath.   Sore throat.  DIAGNOSIS  Acute bronchitis is usually diagnosed through a physical exam. Tests, such as chest X-rays, are sometimes done to rule out other conditions.  TREATMENT  Acute bronchitis usually goes away in a couple weeks. Often times, no medical treatment is necessary. Medicines are sometimes given for relief of fever or cough. Antibiotics are usually not needed but may be prescribed in certain situations. In some cases, an inhaler may be recommended to help reduce shortness of breath and control the cough. A cool mist vaporizer may also be used to help thin bronchial secretions and make it easier to clear the chest.  HOME CARE INSTRUCTIONS  Get plenty of rest.   Drink enough fluids to keep your urine clear or pale yellow (unless you have a medical condition that requires fluid restriction). Increasing fluids may help thin your secretions and will prevent dehydration.   Only take over-the-counter or prescription medicines as directed by your health care provider.   Avoid smoking and secondhand smoke. Exposure to cigarette smoke or irritating chemicals will make bronchitis worse. If you are a smoker, consider using nicotine gum or skin  patches to help control withdrawal symptoms. Quitting smoking will help your lungs heal faster.   Reduce the chances of another bout of acute bronchitis by washing your hands frequently, avoiding people with cold symptoms, and trying not to touch your hands to your mouth, nose, or eyes.   Follow up with your health care provider as directed.  SEEK MEDICAL CARE IF: Your symptoms do not improve after 1 week of treatment.  SEEK IMMEDIATE MEDICAL CARE IF:  You develop an increased fever or chills.   You have chest pain.   You have severe shortness of breath.  You have bloody sputum.   You develop dehydration.  You develop fainting.  You develop repeated vomiting.  You develop a severe headache. MAKE SURE YOU:   Understand these instructions.  Will watch your condition.  Will get help right away if you are not doing well or get worse. Document Released: 08/03/2004 Document Revised: 02/26/2013 Document Reviewed: 12/17/2012 Exit[employer] Patient Information 2014 [employer].  

## 2013-08-07 NOTE — Progress Notes (Signed)
Subjective:    Patient ID: Sheila Shields, female    DOB: June 03, 1935, 79 y.o.   MRN: [id number]  HPI 79 year old white female, nonsmoker, patient of Dr. Darrin Luis in today with complaints of cough, congestion, wheezing x6 days and worsening. Has been taking Robitussin-DM over-the-counter without relief. Her husband was ill with similar symptoms but has recovered completely. She has a history of COPD.   Review of Systems  Constitutional: Negative.   Respiratory: Positive for cough, shortness of breath and wheezing.   Cardiovascular: Negative.  Negative for chest pain.  Endocrine: Negative.   Genitourinary: Negative.   Musculoskeletal: Negative.   Skin: Negative.   Allergic/Immunologic: Negative.   Neurological: Negative.   Hematological: Negative.   Psychiatric/Behavioral: Negative.    Past Medical History  Diagnosis Date  . HYPERLIPIDEMIA 12/17/2006  . ANXIETY 12/18/2006  . ACUTE ALCOHOLIC INTOXICATION IN REMISSION 08/27/2007  . DEPRESSION 12/18/2006  . HYPERTENSION 12/17/2006  . COPD 12/17/2006  . GERD 05/01/2007  . ARTHRITIS 02/23/2010  . HYPERGLYCEMIA 08/27/2007  . Mood disorder   . Arthritis   . Rectal polyp 03/27/2007    adenoma  . Diverticulosis 03/27/2007    History   Social History  . Marital Status: Married    Spouse Name: N/A    Number of Children: 2  . Years of Education: N/A   Occupational History  . retired    Social History Main Topics  . Smoking status: Former Smoker -- 2.00 packs/day for 30 years    Types: Cigarettes    Quit date: 07/10/1984  . Smokeless tobacco: Never Used  . Alcohol Use: No     Comment: no alcoho since 1983  . Drug Use: No  . Sexual Activity: Not on file   Other Topics Concern  . Not on file   Social History Narrative  . No narrative on file    Past Surgical History  Procedure Laterality Date  . Breast biopsy      negative  . Hammer toe surgery  2008    left foot  . Colonoscopy w/ polypectomy  03/27/2007; n7/19/2012   2008: 4 mm adenoma, diverticulosis 2012: ileitis ? NSAID - likely, 2-3 mm cecal polyp LYMPHOID FOLLICLE, diverticulosis  . Esophagogastroduodenoscopy  01/26/2011    reflux esophagitis, colonscopy done also  . Appendectomy  2002  . Abdominal hysterectomy  1978    fibroma  . Carpal tunnel release Left 2010 or 2011  . Shoulder open rotator cuff repair Left 03/06/2013    Procedure: LEFT ROTATOR CUFF REPAIR, SUBACROMIAL DECOMPRESSION, PATCH GRAFT, MANIPULATION UNDER ANESTHESIA;  Surgeon: Johnn Hai, MD;  Location: [facility];  Service: Orthopedics;  Laterality: Left;    Family History  Problem Relation Age of Onset  . Throat cancer Mother   . Heart attack Father 70  . Idiopathic pulmonary fibrosis Brother 25  . Cancer Brother     sarcoma  . Colon cancer Neg Hx     Allergies  Allergen Reactions  . Dilaudid [Hydromorphone Hcl] Other (See Comments)    Pt had hypotension after dilaudid 1mg  IV while in the OR, required temporary pressor intervention.    Current Outpatient Prescriptions on File Prior to Visit  Medication Sig Dispense Refill  . albuterol (PROVENTIL HFA;VENTOLIN HFA) 108 (90 BASE) MCG/ACT inhaler Inhale 2 puffs into the lungs every 6 (six) hours as needed for wheezing.  1 Inhaler  5  . aspirin EC 81 MG tablet Take 81 mg by mouth daily.      Marland Kitchen  Calcium Carbonate-Vitamin D (CALTRATE 600+D) 600-400 MG-UNIT per tablet Take 1 tablet by mouth 2 (two) times daily.       . diclofenac (VOLTAREN) 50 MG EC tablet Take 1 tablet (50 mg total) by mouth 2 (two) times daily.  180 tablet  2  . ferrous sulfate 325 (65 FE) MG tablet TAKE 1 TABLET BY MOUTH EVERY DAY  30 tablet  3  . fish oil-omega-3 fatty acids 1000 MG capsule Take 2 g by mouth daily.      . lansoprazole (PREVACID) 15 MG capsule Take 15 mg by mouth daily.      Marland Kitchen losartan-hydrochlorothiazide (HYZAAR) 100-25 MG per tablet Take 1 tablet by mouth every morning.      . misoprostol (CYTOTEC) 200 MCG tablet Take 1 tablet (200 mcg total)  by mouth 2 (two) times daily.  180 tablet  2  . Multiple Vitamin (MULTIVITAMIN WITH MINERALS) TABS tablet Take 1 tablet by mouth daily.      . mupirocin ointment (BACTROBAN) 2 % Apply topically 3 (three) times daily.  22 g  0  . oxyCODONE-acetaminophen (PERCOCET/ROXICET) 5-325 MG per tablet Take 1-2 tablets by mouth every 4 (four) hours as needed.  40 tablet  0  . potassium chloride SA (K-DUR,KLOR-CON) 20 MEQ tablet Take 1 tablet (20 mEq total) by mouth daily.  90 tablet  3  . simvastatin (ZOCOR) 20 MG tablet Take 20 mg by mouth every morning.      . traMADol (ULTRAM) 50 MG tablet Take 1 tablet (50 mg total) by mouth every 6 (six) hours as needed for pain.  40 tablet  0   No current facility-administered medications on file prior to visit.    BP 122/82  Pulse 97  Temp(Src) 98.3 F (36.8 C) (Oral)  Ht 5\' 6"  (1.676 m)  Wt 198 lb (89.812 kg)  BMI 31.97 kg/m2  SpO2 88%chart    Objective:   Physical Exam  Constitutional: She is oriented to person, place, and time. She appears well-developed and well-nourished.  HENT:  Right Ear: External ear normal.  Left Ear: External ear normal.  Nose: Nose normal.  Mouth/Throat: Oropharynx is clear and moist.  Neck: Normal range of motion. Neck supple.  Cardiovascular: Normal rate and regular rhythm.   Pulmonary/Chest: Effort normal. No respiratory distress. She has wheezes.  Abdominal: Soft. Bowel sounds are normal.  Musculoskeletal: Normal range of motion.  Neurological: She is alert and oriented to person, place, and time.  Skin: Skin is warm and dry.  Psychiatric: She has a normal mood and affect.      Albuterol nebulizer treatment given in the office today and dissected. O2 sats up to 94% on room air    Assessment & Plan:  Assessment: 1. COPD exacerbation 2. Cough 3. Wheezing  Plan: Prednisone 60x3, 40x3, 20x3. Continue Robitussin-DM as needed. 5 the office with any questions or concerns. Check as scheduled, and as needed.

## 2013-08-18 ENCOUNTER — Ambulatory Visit (INDEPENDENT_AMBULATORY_CARE_PROVIDER_SITE_OTHER): Payer: Medicare HMO | Admitting: Family

## 2013-08-18 ENCOUNTER — Encounter: Payer: Self-pay | Admitting: Family

## 2013-08-18 VITALS — BP 120/60 | HR 94 | Ht 66.0 in | Wt 198.0 lb

## 2013-08-18 DIAGNOSIS — IMO0002 Reserved for concepts with insufficient information to code with codable children: Secondary | ICD-10-CM

## 2013-08-18 DIAGNOSIS — M5416 Radiculopathy, lumbar region: Secondary | ICD-10-CM

## 2013-08-18 DIAGNOSIS — J449 Chronic obstructive pulmonary disease, unspecified: Secondary | ICD-10-CM

## 2013-08-18 MED ORDER — CYCLOBENZAPRINE HCL 5 MG PO TABS: 5.0000 mg | ORAL_TABLET | Freq: Three times a day (TID) | ORAL | Status: DC | PRN

## 2013-08-18 MED ORDER — BUDESONIDE-FORMOTEROL FUMARATE 160-4.5 MCG/ACT IN AERO: 2.0000 | INHALATION_SPRAY | Freq: Two times a day (BID) | RESPIRATORY_TRACT | Status: DC

## 2013-08-18 MED ORDER — METHYLPREDNISOLONE ACETATE 40 MG/ML IJ SUSP
80.0000 mg | Freq: Once | INTRAMUSCULAR | Status: AC
  Administered 2013-08-18: 80 mg via INTRAMUSCULAR

## 2013-08-18 NOTE — Patient Instructions (Signed)
Lumbosacral Radiculopathy Lumbosacral radiculopathy is a pinched nerve or nerves in the low back (lumbosacral area). When this happens you may have weakness in your legs and may not be able to stand on your toes. You may have pain going down into your legs. There may be difficulties with walking normally. There are many causes of this problem. Sometimes this may happen from an injury, or simply from arthritis or boney problems. It may also be caused by other illnesses such as diabetes. If there is no improvement after treatment, further studies may be done to find the exact cause. DIAGNOSIS  X-rays may be needed if the problems become long standing. Electromyograms may be done. This study is one in which the working of nerves and muscles is studied. HOME CARE INSTRUCTIONS   Applications of ice packs may be helpful. Ice can be used in a plastic bag with a towel around it to prevent frostbite to skin. This may be used every 2 hours for 20 to 30 minutes, or as needed, while awake, or as directed by your caregiver.  Only take over-the-counter or prescription medicines for pain, discomfort, or fever as directed by your caregiver.  If physical therapy was prescribed, follow your caregiver's directions. SEEK IMMEDIATE MEDICAL CARE IF:   You have pain not controlled with medications.  You seem to be getting worse rather than better.  You develop increasing weakness in your legs.  You develop loss of bowel or bladder control.  You have difficulty with walking or balance, or develop clumsiness in the use of your legs.  You have a fever. MAKE SURE YOU:   Understand these instructions.  Will watch your condition.  Will get help right away if you are not doing well or get worse. Document Released: 06/26/2005 Document Revised: 09/18/2011 Document Reviewed: 02/14/2008 [facility] Patient Information 2014 [place].

## 2013-08-18 NOTE — Progress Notes (Signed)
Subjective:    Patient ID: Sheila Shields, female    DOB: 1934/09/26, 79 y.o.   MRN: [id number]  HPI 79 year old white female, nonsmoker, patient of Dr. Leanne Chang in today with complaints of low back pain that radiates down her right leg. She has a history of sciatica in the past. Recently an 8/10, comes and goes. Has taken Tylenol and this helped some. The pain is worse with standing for extended periods of time and better with sitting and leaning forward.   Review of Systems  Constitutional: Negative.   HENT: Negative.   Respiratory: Positive for cough and wheezing. Negative for shortness of breath.   Cardiovascular: Negative.   Gastrointestinal: Negative.   Genitourinary: Negative.   Musculoskeletal: Positive for back pain.  Skin: Negative.   Neurological: Negative.   Psychiatric/Behavioral: Negative.    Past Medical History  Diagnosis Date  . HYPERLIPIDEMIA 12/17/2006  . ANXIETY 12/18/2006  . ACUTE ALCOHOLIC INTOXICATION IN REMISSION 08/27/2007  . DEPRESSION 12/18/2006  . HYPERTENSION 12/17/2006  . COPD 12/17/2006  . GERD 05/01/2007  . ARTHRITIS 02/23/2010  . HYPERGLYCEMIA 08/27/2007  . Mood disorder   . Arthritis   . Rectal polyp 03/27/2007    adenoma  . Diverticulosis 03/27/2007    History   Social History  . Marital Status: Married    Spouse Name: N/A    Number of Children: 2  . Years of Education: N/A   Occupational History  . retired    Social History Main Topics  . Smoking status: Former Smoker -- 2.00 packs/day for 30 years    Types: Cigarettes    Quit date: 07/10/1984  . Smokeless tobacco: Never Used  . Alcohol Use: No     Comment: no alcoho since 1983  . Drug Use: No  . Sexual Activity: Not on file   Other Topics Concern  . Not on file   Social History Narrative  . No narrative on file    Past Surgical History  Procedure Laterality Date  . Breast biopsy      negative  . Hammer toe surgery  2008    left foot  . Colonoscopy w/ polypectomy   03/27/2007; n7/19/2012    2008: 4 mm adenoma, diverticulosis 2012: ileitis ? NSAID - likely, 2-3 mm cecal polyp LYMPHOID FOLLICLE, diverticulosis  . Esophagogastroduodenoscopy  01/26/2011    reflux esophagitis, colonscopy done also  . Appendectomy  2002  . Abdominal hysterectomy  1978    fibroma  . Carpal tunnel release Left 2010 or 2011  . Shoulder open rotator cuff repair Left 03/06/2013    Procedure: LEFT ROTATOR CUFF REPAIR, SUBACROMIAL DECOMPRESSION, PATCH GRAFT, MANIPULATION UNDER ANESTHESIA;  Surgeon: Johnn Hai, MD;  Location: [facility];  Service: Orthopedics;  Laterality: Left;    Family History  Problem Relation Age of Onset  . Throat cancer Mother   . Heart attack Father 48  . Idiopathic pulmonary fibrosis Brother 59  . Cancer Brother     sarcoma  . Colon cancer Neg Hx     Allergies  Allergen Reactions  . Dilaudid [Hydromorphone Hcl] Other (See Comments)    Pt had hypotension after dilaudid 1mg  IV while in the OR, required temporary pressor intervention.    Current Outpatient Prescriptions on File Prior to Visit  Medication Sig Dispense Refill  . albuterol (PROVENTIL HFA;VENTOLIN HFA) 108 (90 BASE) MCG/ACT inhaler Inhale 2 puffs into the lungs every 6 (six) hours as needed for wheezing.  1 Inhaler  5  .  aspirin EC 81 MG tablet Take 81 mg by mouth daily.      . Calcium Carbonate-Vitamin D (CALTRATE 600+D) 600-400 MG-UNIT per tablet Take 1 tablet by mouth 2 (two) times daily.       . diclofenac (VOLTAREN) 50 MG EC tablet Take 1 tablet (50 mg total) by mouth 2 (two) times daily.  180 tablet  2  . ferrous sulfate 325 (65 FE) MG tablet TAKE 1 TABLET BY MOUTH EVERY DAY  30 tablet  3  . fish oil-omega-3 fatty acids 1000 MG capsule Take 2 g by mouth daily.      . lansoprazole (PREVACID) 15 MG capsule Take 15 mg by mouth daily.      Marland Kitchen losartan-hydrochlorothiazide (HYZAAR) 100-25 MG per tablet Take 1 tablet by mouth every morning.      . misoprostol (CYTOTEC) 200 MCG tablet Take  1 tablet (200 mcg total) by mouth 2 (two) times daily.  180 tablet  2  . Multiple Vitamin (MULTIVITAMIN WITH MINERALS) TABS tablet Take 1 tablet by mouth daily.      . mupirocin ointment (BACTROBAN) 2 % Apply topically 3 (three) times daily.  22 g  0  . oxyCODONE-acetaminophen (PERCOCET/ROXICET) 5-325 MG per tablet Take 1-2 tablets by mouth every 4 (four) hours as needed.  40 tablet  0  . potassium chloride SA (K-DUR,KLOR-CON) 20 MEQ tablet Take 1 tablet (20 mEq total) by mouth daily.  90 tablet  3  . simvastatin (ZOCOR) 20 MG tablet Take 20 mg by mouth every morning.      . traMADol (ULTRAM) 50 MG tablet Take 1 tablet (50 mg total) by mouth every 6 (six) hours as needed for pain.  40 tablet  0   No current facility-administered medications on file prior to visit.    BP 120/60  Pulse 94  Ht 5\' 6"  (1.676 m)  Wt 198 lb (89.812 kg)  BMI 31.97 kg/m2  SpO2 97%chart    Objective:   Physical Exam  Constitutional: She is oriented to person, place, and time. She appears well-developed and well-nourished.  Neck: Normal range of motion. Neck supple.  Cardiovascular: Normal rate, regular rhythm and normal heart sounds.   Pulmonary/Chest: Effort normal. She has wheezes. She has no rales.  Mild expiratory wheezing but significantly improved.  Musculoskeletal: Normal range of motion.  Neurological: She is alert and oriented to person, place, and time.  Skin: Skin is warm and dry.  Psychiatric: She has a normal mood and affect.          Assessment & Plan:  Sheila Shields was seen today for bronchitis and sciatica.  Diagnoses and associated orders for this visit:  COPD (chronic obstructive pulmonary disease) - methylPREDNISolone acetate (DEPO-MEDROL) injection 80 mg; Inject 2 mLs (80 mg total) into the muscle once.  Lumbar radiculopathy, acute - methylPREDNISolone acetate (DEPO-MEDROL) injection 80 mg; Inject 2 mLs (80 mg total) into the muscle once.  Other Orders - cyclobenzaprine (FLEXERIL) 5  MG tablet; Take 1 tablet (5 mg total) by mouth 3 (three) times daily as needed for muscle spasms.   Call the office if symptoms worsen or persist. Recheck as scheduled and as needed.

## 2013-08-19 ENCOUNTER — Telehealth: Payer: Self-pay | Admitting: Internal Medicine

## 2013-08-19 NOTE — Telephone Encounter (Signed)
Relevant patient education assigned to patient using Emmi. ° °

## 2013-09-03 ENCOUNTER — Encounter: Payer: Self-pay | Admitting: Internal Medicine

## 2013-09-03 ENCOUNTER — Ambulatory Visit (INDEPENDENT_AMBULATORY_CARE_PROVIDER_SITE_OTHER): Payer: Medicare HMO | Admitting: Internal Medicine

## 2013-09-03 ENCOUNTER — Ambulatory Visit: Payer: Medicare Other | Admitting: Internal Medicine

## 2013-09-03 VITALS — BP 135/88 | HR 72 | Temp 98.1°F | Ht 66.0 in | Wt 194.5 lb

## 2013-09-03 DIAGNOSIS — E785 Hyperlipidemia, unspecified: Secondary | ICD-10-CM

## 2013-09-03 DIAGNOSIS — J449 Chronic obstructive pulmonary disease, unspecified: Secondary | ICD-10-CM

## 2013-09-03 DIAGNOSIS — I10 Essential (primary) hypertension: Secondary | ICD-10-CM

## 2013-09-03 LAB — HEPATIC FUNCTION PANEL
ALT: 30 U/L (ref 0–35)
AST: 20 U/L (ref 0–37)
Albumin: 3.8 g/dL (ref 3.5–5.2)
Alkaline Phosphatase: 68 U/L (ref 39–117)
BILIRUBIN TOTAL: 0.6 mg/dL (ref 0.3–1.2)
Bilirubin, Direct: 0 mg/dL (ref 0.0–0.3)
TOTAL PROTEIN: 6.8 g/dL (ref 6.0–8.3)

## 2013-09-03 LAB — BASIC METABOLIC PANEL
BUN: 20 mg/dL (ref 6–23)
CHLORIDE: 106 meq/L (ref 96–112)
CO2: 30 meq/L (ref 19–32)
CREATININE: 0.9 mg/dL (ref 0.4–1.2)
Calcium: 9.9 mg/dL (ref 8.4–10.5)
GFR: 69 mL/min (ref >60.00)
Glucose, Bld: 106 mg/dL — ABNORMAL HIGH (ref 70–99)
POTASSIUM: 3.9 meq/L (ref 3.5–5.1)
Sodium: 141 mEq/L (ref 135–145)

## 2013-09-03 LAB — LIPID PANEL
CHOL/HDL RATIO: 4
CHOLESTEROL: 204 mg/dL — AB (ref 0–200)
HDL: 55.5 mg/dL (ref >39.00)
Triglycerides: 214 mg/dL — ABNORMAL HIGH (ref 0.0–149.0)
VLDL: 42.8 mg/dL — AB (ref 0.0–40.0)

## 2013-09-03 LAB — TSH: TSH: 0.98 u[IU]/mL (ref 0.35–5.50)

## 2013-09-03 LAB — LDL CHOLESTEROL, DIRECT: LDL DIRECT: 121.4 mg/dL

## 2013-09-03 NOTE — Progress Notes (Signed)
Pre visit review using our clinic review tool, if applicable. No additional management support is needed unless otherwise documented below in the visit note. 

## 2013-09-03 NOTE — Progress Notes (Signed)
Still with cough- she has known copd- using inhaler (symbicort).  She is feeling well other than the cough Quit smoking 1986  No other complaints  Past Medical History  Diagnosis Date  . HYPERLIPIDEMIA 12/17/2006  . ANXIETY 12/18/2006  . ACUTE ALCOHOLIC INTOXICATION IN REMISSION 08/27/2007  . DEPRESSION 12/18/2006  . HYPERTENSION 12/17/2006  . COPD 12/17/2006  . GERD 05/01/2007  . ARTHRITIS 02/23/2010  . HYPERGLYCEMIA 08/27/2007  . Mood disorder   . Arthritis   . Rectal polyp 03/27/2007    adenoma  . Diverticulosis 03/27/2007    History   Social History  . Marital Status: Married    Spouse Name: N/A    Number of Children: 2  . Years of Education: N/A   Occupational History  . retired    Social History Main Topics  . Smoking status: Former Smoker -- 2.00 packs/day for 30 years    Types: Cigarettes    Quit date: 07/10/1984  . Smokeless tobacco: Never Used  . Alcohol Use: No     Comment: no alcoho since 1983  . Drug Use: No  . Sexual Activity: Not on file   Other Topics Concern  . Not on file   Social History Narrative  . No narrative on file    Past Surgical History  Procedure Laterality Date  . Breast biopsy      negative  . Hammer toe surgery  2008    left foot  . Colonoscopy w/ polypectomy  03/27/2007; n7/19/2012    2008: 4 mm adenoma, diverticulosis 2012: ileitis ? NSAID - likely, 2-3 mm cecal polyp LYMPHOID FOLLICLE, diverticulosis  . Esophagogastroduodenoscopy  01/26/2011    reflux esophagitis, colonscopy done also  . Appendectomy  2002  . Abdominal hysterectomy  1978    fibroma  . Carpal tunnel release Left 2010 or 2011  . Shoulder open rotator cuff repair Left 03/06/2013    Procedure: LEFT ROTATOR CUFF REPAIR, SUBACROMIAL DECOMPRESSION, PATCH GRAFT, MANIPULATION UNDER ANESTHESIA;  Surgeon: Johnn Hai, MD;  Location: [facility];  Service: Orthopedics;  Laterality: Left;    Family History  Problem Relation Age of Onset  . Throat cancer Mother   . Heart  attack Father 1  . Idiopathic pulmonary fibrosis Brother 86  . Cancer Brother     sarcoma  . Colon cancer Neg Hx     Allergies  Allergen Reactions  . Dilaudid [Hydromorphone Hcl] Other (See Comments)    Pt had hypotension after dilaudid 1mg  IV while in the OR, required temporary pressor intervention.    Current Outpatient Prescriptions on File Prior to Visit  Medication Sig Dispense Refill  . albuterol (PROVENTIL HFA;VENTOLIN HFA) 108 (90 BASE) MCG/ACT inhaler Inhale 2 puffs into the lungs every 6 (six) hours as needed for wheezing.  1 Inhaler  5  . aspirin EC 81 MG tablet Take 81 mg by mouth daily.      . budesonide-formoterol (SYMBICORT) 160-4.5 MCG/ACT inhaler Inhale 2 puffs into the lungs 2 (two) times daily.  1 Inhaler  3  . Calcium Carbonate-Vitamin D (CALTRATE 600+D) 600-400 MG-UNIT per tablet Take 1 tablet by mouth 2 (two) times daily.       . cyclobenzaprine (FLEXERIL) 5 MG tablet Take 1 tablet (5 mg total) by mouth 3 (three) times daily as needed for muscle spasms.  30 tablet  0  . fish oil-omega-3 fatty acids 1000 MG capsule Take 2 g by mouth daily.      . Multiple Vitamin (MULTIVITAMIN WITH MINERALS)  TABS tablet Take 1 tablet by mouth daily.      . mupirocin ointment (BACTROBAN) 2 % Apply topically 3 (three) times daily.  22 g  0   No current facility-administered medications on file prior to visit.     patient denies chest pain, shortness of breath, orthopnea. Denies lower extremity edema, abdominal pain, change in appetite, change in bowel movements. Patient denies rashes, musculoskeletal complaints. No other specific complaints in a complete review of systems.   BP 135/88  Pulse 72  Temp(Src) 98.1 F (36.7 C) (Oral)  Ht 5\' 6"  (1.676 m)  Wt 194 lb 8 oz (88.225 kg)  BMI 31.41 kg/m2  Well-developed well-nourished female in no acute distress. HEENT exam atraumatic, normocephalic, extraocular muscles are intact. Neck is supple. No jugular venous distention no  thyromegaly. Chest clear to auscultation without increased work of breathing. Cardiac exam S1 and S2 are regular. Abdominal exam active bowel sounds, soft, nontender. Extremities no edema. Neurologic exam she is alert without any motor sensory deficits. Gait is normal.

## 2013-09-05 ENCOUNTER — Telehealth: Payer: Self-pay | Admitting: Internal Medicine

## 2013-09-05 MED ORDER — SIMVASTATIN 20 MG PO TABS: 20.0000 mg | ORAL_TABLET | Freq: Every morning | ORAL | Status: DC

## 2013-09-05 MED ORDER — MISOPROSTOL 200 MCG PO TABS: 200.0000 ug | ORAL_TABLET | Freq: Two times a day (BID) | ORAL | Status: DC

## 2013-09-05 MED ORDER — LANSOPRAZOLE 15 MG PO CPDR: 15.0000 mg | DELAYED_RELEASE_CAPSULE | Freq: Every day | ORAL | Status: DC

## 2013-09-05 MED ORDER — DICLOFENAC SODIUM 50 MG PO TBEC: 50.0000 mg | DELAYED_RELEASE_TABLET | Freq: Two times a day (BID) | ORAL | Status: DC

## 2013-09-05 MED ORDER — LOSARTAN POTASSIUM-HCTZ 100-25 MG PO TABS: 1.0000 | ORAL_TABLET | Freq: Every morning | ORAL | Status: DC

## 2013-09-05 MED ORDER — FERROUS SULFATE 325 (65 FE) MG PO TABS: ORAL_TABLET | ORAL | Status: DC

## 2013-09-05 MED ORDER — POTASSIUM CHLORIDE CRYS ER 20 MEQ PO TBCR: 20.0000 meq | EXTENDED_RELEASE_TABLET | Freq: Every day | ORAL | Status: DC

## 2013-09-05 NOTE — Telephone Encounter (Signed)
Relevant patient education assigned to patient using Emmi. ° °

## 2013-09-06 NOTE — Assessment & Plan Note (Signed)
No need for furhter evaluation Orders Placed This Encounter  Procedures  . Hepatic function panel  . TSH  . Lipid panel  . Basic metabolic panel  . LDL cholesterol, direct  . Pulmonary function test    Standing Status: Future     Number of Occurrences:      Standing Expiration Date: 09/03/2014    Order Specific Question:  Where should this test be performed?    Answer:   Pulmonary    Order Specific Question:  Full PFT    Answer:  Yes    Order Specific Question:  Spirometry pre & post bronchodilator    Answer:  Yes

## 2013-09-06 NOTE — Assessment & Plan Note (Signed)
Controlled- stay on same meds

## 2013-09-15 ENCOUNTER — Other Ambulatory Visit: Payer: Self-pay | Admitting: Internal Medicine

## 2013-09-15 DIAGNOSIS — Z1231 Encounter for screening mammogram for malignant neoplasm of breast: Secondary | ICD-10-CM

## 2013-10-09 ENCOUNTER — Ambulatory Visit (INDEPENDENT_AMBULATORY_CARE_PROVIDER_SITE_OTHER): Payer: Medicare HMO | Admitting: Internal Medicine

## 2013-10-09 DIAGNOSIS — J449 Chronic obstructive pulmonary disease, unspecified: Secondary | ICD-10-CM

## 2013-10-09 LAB — PULMONARY FUNCTION TEST
DL/VA % PRED: 67 %
DL/VA: 3.31 ml/min/mmHg/L
DLCO UNC % PRED: 48 %
DLCO UNC: 12.53 ml/min/mmHg
FEF 25-75 POST: 1.23 L/s
FEF 25-75 PRE: 1.12 L/s
FEF2575-%Change-Post: 9 %
FEF2575-%PRED-PRE: 68 %
FEF2575-%Pred-Post: 75 %
FEV1-%Change-Post: 1 %
FEV1-%Pred-Post: 87 %
FEV1-%Pred-Pre: 86 %
FEV1-Post: 1.89 L
FEV1-Pre: 1.86 L
FEV1FVC-%Change-Post: 0 %
FEV1FVC-%Pred-Pre: 94 %
FEV6-%CHANGE-POST: 3 %
FEV6-%PRED-POST: 97 %
FEV6-%PRED-PRE: 94 %
FEV6-PRE: 2.58 L
FEV6-Post: 2.66 L
FEV6FVC-%CHANGE-POST: 0 %
FEV6FVC-%PRED-POST: 104 %
FEV6FVC-%Pred-Pre: 103 %
FVC-%CHANGE-POST: 2 %
FVC-%PRED-POST: 93 %
FVC-%Pred-Pre: 91 %
FVC-POST: 2.68 L
FVC-PRE: 2.62 L
POST FEV6/FVC RATIO: 99 %
PRE FEV1/FVC RATIO: 71 %
Post FEV1/FVC ratio: 70 %
Pre FEV6/FVC Ratio: 98 %
RV % pred: 64 %
RV: 1.54 L
TLC % PRED: 79 %
TLC: 4.13 L

## 2013-10-09 NOTE — Progress Notes (Signed)
PFT done today. 

## 2013-10-14 ENCOUNTER — Encounter: Payer: Self-pay | Admitting: Internal Medicine

## 2013-10-20 ENCOUNTER — Ambulatory Visit (HOSPITAL_COMMUNITY)
Admission: RE | Admit: 2013-10-20 | Discharge: 2013-10-20 | Disposition: A | Discharge status: Home / Self Care | Payer: Medicare HMO | Source: Ambulatory Visit | Attending: Internal Medicine | Admitting: Internal Medicine

## 2013-10-20 DIAGNOSIS — Z1231 Encounter for screening mammogram for malignant neoplasm of breast: Secondary | ICD-10-CM

## 2014-02-02 ENCOUNTER — Encounter: Payer: Self-pay | Admitting: Internal Medicine

## 2014-02-04 ENCOUNTER — Encounter: Payer: Self-pay | Admitting: Internal Medicine

## 2014-02-25 ENCOUNTER — Encounter: Payer: Self-pay | Admitting: Family

## 2014-02-25 ENCOUNTER — Ambulatory Visit (INDEPENDENT_AMBULATORY_CARE_PROVIDER_SITE_OTHER): Payer: Medicare HMO | Admitting: Family

## 2014-02-25 VITALS — BP 118/80 | HR 87 | Temp 98.1°F | Ht 66.0 in | Wt 207.0 lb

## 2014-02-25 DIAGNOSIS — R7309 Other abnormal glucose: Secondary | ICD-10-CM

## 2014-02-25 DIAGNOSIS — I10 Essential (primary) hypertension: Secondary | ICD-10-CM

## 2014-02-25 DIAGNOSIS — F3289 Other specified depressive episodes: Secondary | ICD-10-CM

## 2014-02-25 DIAGNOSIS — F329 Major depressive disorder, single episode, unspecified: Secondary | ICD-10-CM

## 2014-02-25 DIAGNOSIS — E7800 Pure hypercholesterolemia, unspecified: Secondary | ICD-10-CM

## 2014-02-25 DIAGNOSIS — R739 Hyperglycemia, unspecified: Secondary | ICD-10-CM

## 2014-02-25 DIAGNOSIS — K219 Gastro-esophageal reflux disease without esophagitis: Secondary | ICD-10-CM

## 2014-02-25 LAB — HEMOGLOBIN A1C: Hgb A1c MFr Bld: 6.3 % (ref 4.6–6.5)

## 2014-02-25 LAB — BASIC METABOLIC PANEL
BUN: 15 mg/dL (ref 6–23)
CALCIUM: 9.9 mg/dL (ref 8.4–10.5)
CO2: 32 meq/L (ref 19–32)
CREATININE: 1.1 mg/dL (ref 0.4–1.2)
Chloride: 101 mEq/L (ref 96–112)
GFR: 54 mL/min — AB (ref >60.00)
Glucose, Bld: 99 mg/dL (ref 70–99)
Potassium: 4 mEq/L (ref 3.5–5.1)
Sodium: 141 mEq/L (ref 135–145)

## 2014-02-25 LAB — HEPATIC FUNCTION PANEL
ALT: 27 U/L (ref 0–35)
AST: 27 U/L (ref 0–37)
Albumin: 3.7 g/dL (ref 3.5–5.2)
Alkaline Phosphatase: 75 U/L (ref 39–117)
BILIRUBIN DIRECT: 0 mg/dL (ref 0.0–0.3)
BILIRUBIN TOTAL: 0.4 mg/dL (ref 0.2–1.2)
TOTAL PROTEIN: 6.7 g/dL (ref 6.0–8.3)

## 2014-02-25 NOTE — Progress Notes (Signed)
Subjective:    Patient ID: BRISA AUTH, female    DOB: 1934/11/05, 79 y.o.   MRN: [id number]  HPI  79 year old white female, nonsmoker but a history of hypertension, hyperlipidemia, anxiety, asthma and in today for recheck. Reports having leg cramps occurring several times a week over the last several months. Is concerned that it could be her blood pressure medication or the simvastatin. Has a history of chronic low back pain and is under the care for the chiropractor and a massage therapist that works well. Takes diclofenac as needed for pain but also works well. Refuses surgery.  Review of Systems  Constitutional: Negative.   HENT: Negative.   Respiratory: Negative.   Cardiovascular: Negative.   Gastrointestinal: Negative.   Endocrine: Negative.   Genitourinary: Negative.   Musculoskeletal: Negative.   Skin: Negative.   Allergic/Immunologic: Negative.   Neurological: Negative.   Psychiatric/Behavioral: Negative.    Past Medical History  Diagnosis Date  . HYPERLIPIDEMIA 12/17/2006  . ANXIETY 12/18/2006  . ACUTE ALCOHOLIC INTOXICATION IN REMISSION 08/27/2007  . DEPRESSION 12/18/2006  . HYPERTENSION 12/17/2006  . COPD 12/17/2006  . GERD 05/01/2007  . ARTHRITIS 02/23/2010  . HYPERGLYCEMIA 08/27/2007  . Mood disorder   . Arthritis   . Rectal polyp 03/27/2007    adenoma  . Diverticulosis 03/27/2007    History   Social History  . Marital Status: Married    Spouse Name: N/A    Number of Children: 2  . Years of Education: N/A   Occupational History  . retired    Social History Main Topics  . Smoking status: Former Smoker -- 2.00 packs/day for 30 years    Types: Cigarettes    Quit date: 07/10/1984  . Smokeless tobacco: Never Used  . Alcohol Use: No     Comment: no alcoho since 1983  . Drug Use: No  . Sexual Activity: Not on file   Other Topics Concern  . Not on file   Social History Narrative  . No narrative on file    Past Surgical History  Procedure Laterality  Date  . Breast biopsy      negative  . Hammer toe surgery  2008    left foot  . Colonoscopy w/ polypectomy  03/27/2007; n7/19/2012    2008: 4 mm adenoma, diverticulosis 2012: ileitis ? NSAID - likely, 2-3 mm cecal polyp LYMPHOID FOLLICLE, diverticulosis  . Esophagogastroduodenoscopy  01/26/2011    reflux esophagitis, colonscopy done also  . Appendectomy  2002  . Abdominal hysterectomy  1978    fibroma  . Carpal tunnel release Left 2010 or 2011  . Shoulder open rotator cuff repair Left 03/06/2013    Procedure: LEFT ROTATOR CUFF REPAIR, SUBACROMIAL DECOMPRESSION, PATCH GRAFT, MANIPULATION UNDER ANESTHESIA;  Surgeon: Johnn Hai, MD;  Location: [facility];  Service: Orthopedics;  Laterality: Left;    Family History  Problem Relation Age of Onset  . Throat cancer Mother   . Heart attack Father 58  . Idiopathic pulmonary fibrosis Brother 73  . Cancer Brother     sarcoma  . Colon cancer Neg Hx     Allergies  Allergen Reactions  . Dilaudid [Hydromorphone Hcl] Other (See Comments)    Pt had hypotension after dilaudid 1mg  IV while in the OR, required temporary pressor intervention.    Current Outpatient Prescriptions on File Prior to Visit  Medication Sig Dispense Refill  . albuterol (PROVENTIL HFA;VENTOLIN HFA) 108 (90 BASE) MCG/ACT inhaler Inhale 2 puffs into the lungs every  6 (six) hours as needed for wheezing.  1 Inhaler  5  . aspirin EC 81 MG tablet Take 81 mg by mouth daily.      . budesonide-formoterol (SYMBICORT) 160-4.5 MCG/ACT inhaler Inhale 2 puffs into the lungs 2 (two) times daily.  1 Inhaler  3  . Calcium Carbonate-Vitamin D (CALTRATE 600+D) 600-400 MG-UNIT per tablet Take 1 tablet by mouth 2 (two) times daily.       . cyclobenzaprine (FLEXERIL) 5 MG tablet Take 1 tablet (5 mg total) by mouth 3 (three) times daily as needed for muscle spasms.  30 tablet  0  . diclofenac (VOLTAREN) 50 MG EC tablet Take 1 tablet (50 mg total) by mouth 2 (two) times daily.  180 tablet  3  .  ferrous sulfate 325 (65 FE) MG tablet TAKE 1 TABLET BY MOUTH EVERY DAY  90 tablet  3  . fish oil-omega-3 fatty acids 1000 MG capsule Take 2 g by mouth daily.      . lansoprazole (PREVACID) 15 MG capsule Take 1 capsule (15 mg total) by mouth daily.  90 capsule  3  . losartan-hydrochlorothiazide (HYZAAR) 100-25 MG per tablet Take 1 tablet by mouth every morning.  90 tablet  3  . misoprostol (CYTOTEC) 200 MCG tablet Take 1 tablet (200 mcg total) by mouth 2 (two) times daily.  180 tablet  2  . Multiple Vitamin (MULTIVITAMIN WITH MINERALS) TABS tablet Take 1 tablet by mouth daily.      . mupirocin ointment (BACTROBAN) 2 % Apply topically 3 (three) times daily.  22 g  0  . potassium chloride SA (K-DUR,KLOR-CON) 20 MEQ tablet Take 1 tablet (20 mEq total) by mouth daily.  90 tablet  3  . simvastatin (ZOCOR) 20 MG tablet Take 1 tablet (20 mg total) by mouth every morning.  90 tablet  3   No current facility-administered medications on file prior to visit.    BP 118/80  Pulse 87  Temp(Src) 98.1 F (36.7 C) (Oral)  Ht 5\' 6"  (1.676 m)  Wt 207 lb (93.895 kg)  BMI 33.43 kg/m2chart     Objective:   Physical Exam  Constitutional: She is oriented to person, place, and time. She appears well-developed and well-nourished.  Neck: Normal range of motion. Neck supple.  Cardiovascular: Normal rate, regular rhythm and normal heart sounds.   Pulmonary/Chest: Effort normal and breath sounds normal.  Abdominal: Soft. Bowel sounds are normal.  Musculoskeletal: Normal range of motion.  Neurological: She is alert and oriented to person, place, and time.  Skin: Skin is warm and dry.  Psychiatric: She has a normal mood and affect.          Assessment & Plan:  [place] was seen today for establish care.  Diagnoses and associated orders for this visit:  Unspecified essential hypertension - Basic Metabolic Panel - Hepatic Function Panel  Gastroesophageal reflux disease without esophagitis  Pure  hypercholesterolemia - Basic Metabolic Panel - Hepatic Function Panel  Depressive disorder, not elsewhere classified  Hyperglycemia - Hemoglobin A1c    Continue current medications. Followup in 6 months for complete physical exam.

## 2014-02-25 NOTE — Patient Instructions (Signed)
Leg Cramps Leg cramps that occur during exercise can be caused by poor circulation or dehydration. However, muscle cramps that occur at rest or during the night are usually not due to any serious medical problem. Heat cramps may cause muscle spasms during hot weather.  CAUSES There is no clear cause for muscle cramps. However, dehydration may be a factor for those who do not drink enough fluids and those who exercise in the heat. Imbalances in the level of sodium, potassium, calcium or magnesium in the muscle tissue may also be a factor. Some medications, such as water pills (diuretics), may cause loss of chemicals that the body needs (like sodium and potassium) and cause muscle cramps. TREATMENT   Make sure your diet has enough fluids and essential minerals for the muscle to work normally.  Avoid strenuous exercise for several days if you have been having frequent leg cramps.  Stretch and massage the cramped muscle for several minutes.  Some medicines may be helpful in some patients with night cramps. Only take over-the-counter or prescription medicines as directed by your caregiver. SEEK IMMEDIATE MEDICAL CARE IF:   Your leg cramps become worse.  Your foot becomes cold, numb, or blue. Document Released: 08/03/2004 Document Revised: 09/18/2011 Document Reviewed: 07/21/2008 Exit[employer] Patient Information 2015 [employer]. This information is not intended to replace advice given to you by your health care provider. Make sure you discuss any questions you have with your health care provider.  

## 2014-02-25 NOTE — Progress Notes (Signed)
Pre visit review using our clinic review tool, if applicable. No additional management support is needed unless otherwise documented below in the visit note. 

## 2014-03-03 ENCOUNTER — Encounter: Payer: Self-pay | Admitting: Family

## 2014-03-03 ENCOUNTER — Ambulatory Visit (INDEPENDENT_AMBULATORY_CARE_PROVIDER_SITE_OTHER): Payer: Medicare HMO | Admitting: Family

## 2014-03-03 VITALS — BP 124/82 | Temp 98.2°F | Ht 66.0 in | Wt 206.0 lb

## 2014-03-03 DIAGNOSIS — L723 Sebaceous cyst: Secondary | ICD-10-CM

## 2014-03-03 MED ORDER — ALBUTEROL SULFATE HFA 108 (90 BASE) MCG/ACT IN AERS: 2.0000 | INHALATION_SPRAY | Freq: Four times a day (QID) | RESPIRATORY_TRACT | Status: DC | PRN

## 2014-03-03 MED ORDER — CEPHALEXIN 500 MG PO CAPS: 500.0000 mg | ORAL_CAPSULE | Freq: Three times a day (TID) | ORAL | Status: DC

## 2014-03-03 NOTE — Progress Notes (Signed)
Pre visit review using our clinic review tool, if applicable. No additional management support is needed unless otherwise documented below in the visit note. 

## 2014-03-03 NOTE — Progress Notes (Signed)
Subjective:    Patient ID: Sheila Shields, female    DOB: 10/31/1934, 79 y.o.   MRN: [id number]  HPI  79 year old white female, nonsmoker, in today with complaints of a boil in her left trunk 3-4 days.Thinsk that the under wire in her bra may have scratched her.   Review of Systems  Constitutional: Negative.   Respiratory: Negative.   Cardiovascular: Negative.   Skin: Positive for wound.       Left trunk: has a boil.   Psychiatric/Behavioral: Negative.    Past Medical History  Diagnosis Date  . HYPERLIPIDEMIA 12/17/2006  . ANXIETY 12/18/2006  . ACUTE ALCOHOLIC INTOXICATION IN REMISSION 08/27/2007  . DEPRESSION 12/18/2006  . HYPERTENSION 12/17/2006  . COPD 12/17/2006  . GERD 05/01/2007  . ARTHRITIS 02/23/2010  . HYPERGLYCEMIA 08/27/2007  . Mood disorder   . Arthritis   . Rectal polyp 03/27/2007    adenoma  . Diverticulosis 03/27/2007    History   Social History  . Marital Status: Married    Spouse Name: N/A    Number of Children: 2  . Years of Education: N/A   Occupational History  . retired    Social History Main Topics  . Smoking status: Former Smoker -- 2.00 packs/day for 30 years    Types: Cigarettes    Quit date: 07/10/1984  . Smokeless tobacco: Never Used  . Alcohol Use: No     Comment: no alcoho since 1983  . Drug Use: No  . Sexual Activity: Not on file   Other Topics Concern  . Not on file   Social History Narrative  . No narrative on file    Past Surgical History  Procedure Laterality Date  . Breast biopsy      negative  . Hammer toe surgery  2008    left foot  . Colonoscopy w/ polypectomy  03/27/2007; n7/19/2012    2008: 4 mm adenoma, diverticulosis 2012: ileitis ? NSAID - likely, 2-3 mm cecal polyp LYMPHOID FOLLICLE, diverticulosis  . Esophagogastroduodenoscopy  01/26/2011    reflux esophagitis, colonscopy done also  . Appendectomy  2002  . Abdominal hysterectomy  1978    fibroma  . Carpal tunnel release Left 2010 or 2011  . Shoulder open  rotator cuff repair Left 03/06/2013    Procedure: LEFT ROTATOR CUFF REPAIR, SUBACROMIAL DECOMPRESSION, PATCH GRAFT, MANIPULATION UNDER ANESTHESIA;  Surgeon: Johnn Hai, MD;  Location: [facility];  Service: Orthopedics;  Laterality: Left;    Family History  Problem Relation Age of Onset  . Throat cancer Mother   . Heart attack Father 33  . Idiopathic pulmonary fibrosis Brother 8  . Cancer Brother     sarcoma  . Colon cancer Neg Hx     Allergies  Allergen Reactions  . Dilaudid [Hydromorphone Hcl] Other (See Comments)    Pt had hypotension after dilaudid 1mg  IV while in the OR, required temporary pressor intervention.    Current Outpatient Prescriptions on File Prior to Visit  Medication Sig Dispense Refill  . aspirin EC 81 MG tablet Take 81 mg by mouth daily.      . budesonide-formoterol (SYMBICORT) 160-4.5 MCG/ACT inhaler Inhale 2 puffs into the lungs 2 (two) times daily.  1 Inhaler  3  . Calcium Carbonate-Vitamin D (CALTRATE 600+D) 600-400 MG-UNIT per tablet Take 1 tablet by mouth 2 (two) times daily.       . cyclobenzaprine (FLEXERIL) 5 MG tablet Take 1 tablet (5 mg total) by mouth 3 (three)  times daily as needed for muscle spasms.  30 tablet  0  . diclofenac (VOLTAREN) 50 MG EC tablet Take 1 tablet (50 mg total) by mouth 2 (two) times daily.  180 tablet  3  . ferrous sulfate 325 (65 FE) MG tablet TAKE 1 TABLET BY MOUTH EVERY DAY  90 tablet  3  . fish oil-omega-3 fatty acids 1000 MG capsule Take 2 g by mouth daily.      . lansoprazole (PREVACID) 15 MG capsule Take 1 capsule (15 mg total) by mouth daily.  90 capsule  3  . losartan-hydrochlorothiazide (HYZAAR) 100-25 MG per tablet Take 1 tablet by mouth every morning.  90 tablet  3  . misoprostol (CYTOTEC) 200 MCG tablet Take 1 tablet (200 mcg total) by mouth 2 (two) times daily.  180 tablet  2  . Multiple Vitamin (MULTIVITAMIN WITH MINERALS) TABS tablet Take 1 tablet by mouth daily.      . mupirocin ointment (BACTROBAN) 2 % Apply  topically 3 (three) times daily.  22 g  0  . potassium chloride SA (K-DUR,KLOR-CON) 20 MEQ tablet Take 1 tablet (20 mEq total) by mouth daily.  90 tablet  3  . simvastatin (ZOCOR) 20 MG tablet Take 1 tablet (20 mg total) by mouth every morning.  90 tablet  3   No current facility-administered medications on file prior to visit.    BP 124/82  Temp(Src) 98.2 F (36.8 C) (Oral)  Ht 5\' 6"  (1.676 m)  Wt 206 lb (93.441 kg)  BMI 33.27 kg/m2chart     Objective:   Physical Exam  Constitutional: She is oriented to person, place, and time. She appears well-developed and well-nourished.  Neck: Normal range of motion. Neck supple.  Cardiovascular: Normal rate, regular rhythm and normal heart sounds.   Pulmonary/Chest: Effort normal and breath sounds normal.  Neurological: She is alert and oriented to person, place, and time.  Skin:     Psychiatric: She has a normal mood and affect.     Informed consent was given by the patient for a I&D. The site was prepped with Betadine and using a 11 blade a .5cm incision was made. Hemostasis was achieved with a compression. Wound care was discussed with the patient.      Assessment & Plan:  Sheila Shields was seen today for no specified reason.  Diagnoses and associated orders for this visit:  Sebaceous cyst  Other Orders - albuterol (PROVENTIL HFA;VENTOLIN HFA) 108 (90 BASE) MCG/ACT inhaler; Inhale 2 puffs into the lungs every 6 (six) hours as needed for wheezing. - cephALEXin (KEFLEX) 500 MG capsule; Take 1 capsule (500 mg total) by mouth 3 (three) times daily.   Call the office with any questions or concerns.

## 2014-03-03 NOTE — Patient Instructions (Signed)
Incision and Drainage Care After Refer to this sheet in the next few weeks. These instructions provide you with information on caring for yourself after your procedure. Your caregiver may also give you more specific instructions. Your treatment has been planned according to current medical practices, but problems sometimes occur. Call your caregiver if you have any problems or questions after your procedure. HOME CARE INSTRUCTIONS   If antibiotic medicine is given, take it as directed. Finish it even if you start to feel better.  Only take over-the-counter or prescription medicines for pain, discomfort, or fever as directed by your caregiver.  Keep all follow-up appointments as directed by your caregiver.  Change any bandages (dressings) as directed by your caregiver. Replace old dressings with clean dressings.  Wash your hands before and after caring for your wound. You will receive specific instructions for cleansing and caring for your wound.  SEEK MEDICAL CARE IF:   You have increased pain, swelling, or redness around the wound.  You have increased drainage, smell, or bleeding from the wound.  You have muscle aches, chills, or you feel generally sick.  You have a fever. MAKE SURE YOU:   Understand these instructions.  Will watch your condition.  Will get help right away if you are not doing well or get worse. Document Released: 09/18/2011 Document Reviewed: 09/18/2011 [facility] Patient Information 2015 [place]. This information is not intended to replace advice given to you by your health care provider. Make sure you discuss any questions you have with your health care provider.

## 2014-06-09 ENCOUNTER — Other Ambulatory Visit: Payer: Self-pay | Admitting: Internal Medicine

## 2014-06-30 ENCOUNTER — Telehealth: Payer: Self-pay | Admitting: Family

## 2014-06-30 NOTE — Telephone Encounter (Signed)
Please advise 

## 2014-06-30 NOTE — Telephone Encounter (Signed)
Pt having dental implants next tues. Needs to know should she take the 800 mg  ibuprofen the dentist prescribed and stop the diclofenac (VOLTAREN) 50 MG EC tablet and misoprostol (CYTOTEC) 200 MCG tablet Pt also has 3 tabs of tramadol left, is it ok to take that?

## 2014-06-30 NOTE — Telephone Encounter (Signed)
Yes ok to take Ibuprofen. Do not take both Ibuprofen AND Diclofenac. OK to take tramadol

## 2014-07-01 NOTE — Telephone Encounter (Signed)
Pt aware and verbalized understanding.  

## 2014-07-20 ENCOUNTER — Encounter: Payer: Self-pay | Admitting: Family Medicine

## 2014-07-20 ENCOUNTER — Ambulatory Visit (INDEPENDENT_AMBULATORY_CARE_PROVIDER_SITE_OTHER): Payer: Medicare HMO | Admitting: Family Medicine

## 2014-07-20 VITALS — BP 118/76 | HR 72 | Temp 97.4°F | Ht 65.0 in | Wt 198.9 lb

## 2014-07-20 DIAGNOSIS — J441 Chronic obstructive pulmonary disease with (acute) exacerbation: Secondary | ICD-10-CM

## 2014-07-20 DIAGNOSIS — J069 Acute upper respiratory infection, unspecified: Secondary | ICD-10-CM

## 2014-07-20 MED ORDER — PREDNISONE 20 MG PO TABS
40.0000 mg | ORAL_TABLET | Freq: Every day | ORAL | Status: DC
Start: 2014-07-20 — End: 2014-08-10

## 2014-07-20 MED ORDER — AZITHROMYCIN 250 MG PO TABS: ORAL_TABLET | ORAL | Status: DC

## 2014-07-20 NOTE — Progress Notes (Signed)
Pre visit review using our clinic review tool, if applicable. No additional management support is needed unless otherwise documented below in the visit note. 

## 2014-07-20 NOTE — Patient Instructions (Signed)

## 2014-07-20 NOTE — Progress Notes (Signed)
HPI:  URI: -started: 2 weeks ago, improved a little, but then worse   -symptoms:nasal congestion, sore throat, cough - productive of lots of mucus, deep cough, SOB no increased from baseline, but has needed inhaler for increased wheezing, sinus pressure -denies:fever, NVD, sinus pain -has tried: her regular COPD medications -sick contacts/travel/risks: denies flu exposure, tick exposure or or Ebola risks -Hx of: allergies, COPD and takes inhalers for this  ROS: See pertinent positives and negatives per HPI.  Past Medical History  Diagnosis Date  . HYPERLIPIDEMIA 12/17/2006  . ANXIETY 12/18/2006  . ACUTE ALCOHOLIC INTOXICATION IN REMISSION 08/27/2007  . DEPRESSION 12/18/2006  . HYPERTENSION 12/17/2006  . COPD 12/17/2006  . GERD 05/01/2007  . ARTHRITIS 02/23/2010  . HYPERGLYCEMIA 08/27/2007  . Mood disorder   . Arthritis   . Rectal polyp 03/27/2007    adenoma  . Diverticulosis 03/27/2007    Past Surgical History  Procedure Laterality Date  . Breast biopsy      negative  . Hammer toe surgery  2008    left foot  . Colonoscopy w/ polypectomy  03/27/2007; n7/19/2012    2008: 4 mm adenoma, diverticulosis 2012: ileitis ? NSAID - likely, 2-3 mm cecal polyp LYMPHOID FOLLICLE, diverticulosis  . Esophagogastroduodenoscopy  01/26/2011    reflux esophagitis, colonscopy done also  . Appendectomy  2002  . Abdominal hysterectomy  1978    fibroma  . Carpal tunnel release Left 2010 or 2011  . Shoulder open rotator cuff repair Left 03/06/2013    Procedure: LEFT ROTATOR CUFF REPAIR, SUBACROMIAL DECOMPRESSION, PATCH GRAFT, MANIPULATION UNDER ANESTHESIA;  Surgeon: Johnn Hai, MD;  Location: [facility];  Service: Orthopedics;  Laterality: Left;    Family History  Problem Relation Age of Onset  . Throat cancer Mother   . Heart attack Father 58  . Idiopathic pulmonary fibrosis Brother 96  . Cancer Brother     sarcoma  . Colon cancer Neg Hx     History   Social History  . Marital Status:  Married    Spouse Name: N/A    Number of Children: 2  . Years of Education: N/A   Occupational History  . retired    Social History Main Topics  . Smoking status: Former Smoker -- 2.00 packs/day for 30 years    Types: Cigarettes    Quit date: 07/10/1984  . Smokeless tobacco: Never Used  . Alcohol Use: No     Comment: no alcoho since 1983  . Drug Use: No  . Sexual Activity: None   Other Topics Concern  . None   Social History Narrative    Current outpatient prescriptions: albuterol (PROVENTIL HFA;VENTOLIN HFA) 108 (90 BASE) MCG/ACT inhaler, Inhale 2 puffs into the lungs every 6 (six) hours as needed for wheezing., Disp: 1 Inhaler, Rfl: 2;  aspirin EC 81 MG tablet, Take 81 mg by mouth daily., Disp: , Rfl: ;  budesonide-formoterol (SYMBICORT) 160-4.5 MCG/ACT inhaler, Inhale 2 puffs into the lungs 2 (two) times daily., Disp: 1 Inhaler, Rfl: 3 Calcium Carbonate-Vitamin D (CALTRATE 600+D) 600-400 MG-UNIT per tablet, Take 1 tablet by mouth 2 (two) times daily. , Disp: , Rfl: ;  diclofenac (VOLTAREN) 50 MG EC tablet, Take 1 tablet (50 mg total) by mouth 2 (two) times daily., Disp: 180 tablet, Rfl: 3;  ferrous sulfate 325 (65 FE) MG tablet, TAKE 1 TABLET BY MOUTH EVERY DAY, Disp: 90 tablet, Rfl: 3;  fish oil-omega-3 fatty acids 1000 MG capsule, Take 2 g by mouth daily.,  Disp: , Rfl:  lansoprazole (PREVACID) 15 MG capsule, Take 1 capsule (15 mg total) by mouth daily., Disp: 90 capsule, Rfl: 3;  losartan-hydrochlorothiazide (HYZAAR) 100-25 MG per tablet, Take 1 tablet by mouth every morning., Disp: 90 tablet, Rfl: 3;  misoprostol (CYTOTEC) 200 MCG tablet, TAKE 1 TABLET BY MOUTH TWICE A DAY, Disp: 180 tablet, Rfl: 2;  Multiple Vitamin (MULTIVITAMIN WITH MINERALS) TABS tablet, Take 1 tablet by mouth daily., Disp: , Rfl:  potassium chloride SA (K-DUR,KLOR-CON) 20 MEQ tablet, Take 1 tablet (20 mEq total) by mouth daily., Disp: 90 tablet, Rfl: 3;  simvastatin (ZOCOR) 20 MG tablet, Take 1 tablet (20 mg  total) by mouth every morning., Disp: 90 tablet, Rfl: 3;  azithromycin (ZITHROMAX) 250 MG tablet, 2 tabs on first day then one tab daily, Disp: 6 tablet, Rfl: 0 predniSONE (DELTASONE) 20 MG tablet, Take 2 tablets (40 mg total) by mouth daily with breakfast., Disp: 8 tablet, Rfl: 0  EXAM:  Filed Vitals:   07/20/14 1609  BP: 118/76  Pulse: 72  Temp: 97.4 F (36.3 C)    Body mass index is 33.1 kg/(m^2).  GENERAL: vitals reviewed and listed above, alert, oriented, appears well hydrated and in no acute distress  HEENT: atraumatic, conjunttiva clear, no obvious abnormalities on inspection of external nose and ears, normal appearance of ear canals and TMs, clear nasal congestion, mild post oropharyngeal erythema with PND, no tonsillar edema or exudate, no sinus TTP  NECK: no obvious masses on inspection  LUNGS: clear to auscultation bilaterally, no wheezes, rales or rhonchi, good air movement  CV: HRRR, no peripheral edema  MS: moves all extremities without noticeable abnormality  PSYCH: pleasant and cooperative, no obvious depression or anxiety  ASSESSMENT AND PLAN:  Discussed the following assessment and plan:  COPD exacerbation - Plan: predniSONE (DELTASONE) 20 MG tablet, azithromycin (ZITHROMAX) 250 MG tablet  Acute upper respiratory infection - Plan: predniSONE (DELTASONE) 20 MG tablet, azithromycin (ZITHROMAX) 250 MG tablet  -We discussed potential etiologies, with VURI being most likely, and advised supportive care and monitoring. We discussed treatment side effects, likely course, antibiotic misuse, transmission, and signs of developing a serious illness. -she opted for abx and prednisone given worsening after 2 weeks and worsened wheezing -of course, we advised to return or notify a doctor immediately if symptoms worsen or persist or new concerns arise.    Patient Instructions  -As we discussed, we have prescribed a new medication for you at this appointment. We  discussed the common and serious potential adverse effects of this medication and you can review these and more with the pharmacist when you pick up your medication.  Please follow the instructions for use carefully and notify us immediately if you have any problems taking this medication.        Colin Benton R.

## 2014-08-10 ENCOUNTER — Ambulatory Visit (INDEPENDENT_AMBULATORY_CARE_PROVIDER_SITE_OTHER): Payer: Medicare HMO | Admitting: Family

## 2014-08-10 ENCOUNTER — Encounter: Payer: Self-pay | Admitting: Family

## 2014-08-10 ENCOUNTER — Other Ambulatory Visit: Payer: Self-pay | Admitting: Internal Medicine

## 2014-08-10 VITALS — BP 104/80 | Temp 97.9°F | Ht 65.0 in | Wt 194.0 lb

## 2014-08-10 DIAGNOSIS — E7800 Pure hypercholesterolemia, unspecified: Secondary | ICD-10-CM

## 2014-08-10 DIAGNOSIS — G4700 Insomnia, unspecified: Secondary | ICD-10-CM

## 2014-08-10 DIAGNOSIS — IMO0001 Reserved for inherently not codable concepts without codable children: Secondary | ICD-10-CM

## 2014-08-10 DIAGNOSIS — Z780 Asymptomatic menopausal state: Secondary | ICD-10-CM

## 2014-08-10 DIAGNOSIS — J449 Chronic obstructive pulmonary disease, unspecified: Secondary | ICD-10-CM

## 2014-08-10 DIAGNOSIS — R739 Hyperglycemia, unspecified: Secondary | ICD-10-CM

## 2014-08-10 MED ORDER — LOSARTAN POTASSIUM-HCTZ 100-12.5 MG PO TABS: 1.0000 | ORAL_TABLET | Freq: Every day | ORAL | Status: DC

## 2014-08-10 NOTE — Progress Notes (Signed)
Pre visit review using our clinic review tool, if applicable. No additional management support is needed unless otherwise documented below in the visit note. 

## 2014-08-10 NOTE — Progress Notes (Signed)
Subjective:    Patient ID: Sheila Shields, female    DOB: April 14, 1935, 80 y.o.   MRN: [id number]  HPI 80 year old white female, nonsmoker with a history of alcohol dependence in remission is in today for recheck of hypertension, hyperlipidemia, hyperglycemia, and COPD. Has concerns of insomnia that is a new symptom for her. Reports difficulty falling asleep. Unable to turn her brain off. Has not tried any medication for relief. Has a history of insomnia in the past. Reports at that time it was related to depression but denies any depression.   Review of Systems  Constitutional: Negative.   HENT: Negative.   Respiratory: Negative.   Cardiovascular: Negative.   Gastrointestinal: Negative.   Endocrine: Negative.   Genitourinary: Negative.   Musculoskeletal: Negative.   Skin: Negative.   Allergic/Immunologic: Negative.   Neurological: Negative.   Psychiatric/Behavioral: Positive for sleep disturbance.   Past Medical History  Diagnosis Date  . HYPERLIPIDEMIA 12/17/2006  . ANXIETY 12/18/2006  . ACUTE ALCOHOLIC INTOXICATION IN REMISSION 08/27/2007  . DEPRESSION 12/18/2006  . HYPERTENSION 12/17/2006  . COPD 12/17/2006  . GERD 05/01/2007  . ARTHRITIS 02/23/2010  . HYPERGLYCEMIA 08/27/2007  . Mood disorder   . Arthritis   . Rectal polyp 03/27/2007    adenoma  . Diverticulosis 03/27/2007    History   Social History  . Marital Status: Married    Spouse Name: N/A    Number of Children: 2  . Years of Education: N/A   Occupational History  . retired    Social History Main Topics  . Smoking status: Former Smoker -- 2.00 packs/day for 30 years    Types: Cigarettes    Quit date: 07/10/1984  . Smokeless tobacco: Never Used  . Alcohol Use: No     Comment: no alcoho since 1983  . Drug Use: No  . Sexual Activity: Not on file   Other Topics Concern  . Not on file   Social History Narrative    Past Surgical History  Procedure Laterality Date  . Breast biopsy      negative  . Hammer  toe surgery  2008    left foot  . Colonoscopy w/ polypectomy  03/27/2007; n7/19/2012    2008: 4 mm adenoma, diverticulosis 2012: ileitis ? NSAID - likely, 2-3 mm cecal polyp LYMPHOID FOLLICLE, diverticulosis  . Esophagogastroduodenoscopy  01/26/2011    reflux esophagitis, colonscopy done also  . Appendectomy  2002  . Abdominal hysterectomy  1978    fibroma  . Carpal tunnel release Left 2010 or 2011  . Shoulder open rotator cuff repair Left 03/06/2013    Procedure: LEFT ROTATOR CUFF REPAIR, SUBACROMIAL DECOMPRESSION, PATCH GRAFT, MANIPULATION UNDER ANESTHESIA;  Surgeon: Johnn Hai, MD;  Location: [facility];  Service: Orthopedics;  Laterality: Left;    Family History  Problem Relation Age of Onset  . Throat cancer Mother   . Heart attack Father 60  . Idiopathic pulmonary fibrosis Brother 67  . Cancer Brother     sarcoma  . Colon cancer Neg Hx     Allergies  Allergen Reactions  . Dilaudid [Hydromorphone Hcl] Other (See Comments)    Pt had hypotension after dilaudid 1mg  IV while in the OR, required temporary pressor intervention.    Current Outpatient Prescriptions on File Prior to Visit  Medication Sig Dispense Refill  . albuterol (PROVENTIL HFA;VENTOLIN HFA) 108 (90 BASE) MCG/ACT inhaler Inhale 2 puffs into the lungs every 6 (six) hours as needed for wheezing. 1 Inhaler 2  .  aspirin EC 81 MG tablet Take 81 mg by mouth daily.    . budesonide-formoterol (SYMBICORT) 160-4.5 MCG/ACT inhaler Inhale 2 puffs into the lungs 2 (two) times daily. 1 Inhaler 3  . Calcium Carbonate-Vitamin D (CALTRATE 600+D) 600-400 MG-UNIT per tablet Take 1 tablet by mouth 2 (two) times daily.     . ferrous sulfate 325 (65 FE) MG tablet TAKE 1 TABLET BY MOUTH EVERY DAY 90 tablet 3  . fish oil-omega-3 fatty acids 1000 MG capsule Take 2 g by mouth daily.    . lansoprazole (PREVACID) 15 MG capsule Take 1 capsule (15 mg total) by mouth daily. 90 capsule 3  . Multiple Vitamin (MULTIVITAMIN WITH MINERALS) TABS  tablet Take 1 tablet by mouth daily.    . simvastatin (ZOCOR) 20 MG tablet Take 1 tablet (20 mg total) by mouth every morning. 90 tablet 3   No current facility-administered medications on file prior to visit.    BP 104/80 mmHg  Temp(Src) 97.9 F (36.6 C) (Oral)  Ht 5\' 5"  (1.651 m)  Wt 194 lb (87.998 kg)  BMI 32.28 kg/m2chart    Objective:   Physical Exam  Constitutional: She is oriented to person, place, and time. She appears well-developed and well-nourished.  HENT:  Right Ear: External ear normal.  Left Ear: External ear normal.  Nose: Nose normal.  Mouth/Throat: Oropharynx is clear and moist.  Neck: Normal range of motion. Neck supple. No thyromegaly present.  Cardiovascular: Normal rate, regular rhythm and normal heart sounds.   Pulmonary/Chest: Effort normal and breath sounds normal.  Abdominal: Soft. Bowel sounds are normal.  Musculoskeletal: Normal range of motion.  Neurological: She is alert and oriented to person, place, and time.  Skin: Skin is warm and dry.          Assessment & Plan:  Monik was seen today for follow-up.  Diagnoses and associated orders for this visit:  Pure hypercholesterolemia - Basic Metabolic Panel; Future - Hepatic Function Panel; Future - Lipid Panel; Future - CBC with Differential; Future - TSH; Future  Hyperglycemia - Basic Metabolic Panel; Future - Hepatic Function Panel; Future - CBC with Differential; Future  COPD bronchitis - CBC with Differential; Future  Insomnia - CBC with Differential; Future - TSH; Future  Other Orders - losartan-hydrochlorothiazide (HYZAAR) 100-12.5 MG per tablet; Take 1 tablet by mouth daily.    Encouraged healthy diet and exercise. No TV bedtime. No caffeine after 5 PM. Antihistamine Benadryl at bedtime as needed for insomnia. Call the office with any questions or concerns. Recheck pending labs and sooner as needed.

## 2014-08-10 NOTE — Patient Instructions (Signed)
Insomnia Insomnia is frequent trouble falling and/or staying asleep. Insomnia can be a long term problem or a short term problem. Both are common. Insomnia can be a short term problem when the wakefulness is related to a certain stress or worry. Long term insomnia is often related to ongoing stress during waking hours and/or poor sleeping habits. Overtime, sleep deprivation itself can make the problem worse. Every little thing feels more severe because you are overtired and your ability to cope is decreased. CAUSES   Stress, anxiety, and depression.  Poor sleeping habits.  Distractions such as TV in the bedroom.  Naps close to bedtime.  Engaging in emotionally charged conversations before bed.  Technical reading before sleep.  Alcohol and other sedatives. They may make the problem worse. They can hurt normal sleep patterns and normal dream activity.  Stimulants such as caffeine for several hours prior to bedtime.  Pain syndromes and shortness of breath can cause insomnia.  Exercise late at night.  Changing time zones may cause sleeping problems (jet lag). It is sometimes helpful to have someone observe your sleeping patterns. They should look for periods of not breathing during the night (sleep apnea). They should also look to see how long those periods last. If you live alone or observers are uncertain, you can also be observed at a sleep clinic where your sleep patterns will be professionally monitored. Sleep apnea requires a checkup and treatment. Give your caregivers your medical history. Give your caregivers observations your family has made about your sleep.  SYMPTOMS   Not feeling rested in the morning.  Anxiety and restlessness at bedtime.  Difficulty falling and staying asleep. TREATMENT   Your caregiver may prescribe treatment for an underlying medical disorders. Your caregiver can give advice or help if you are using alcohol or other drugs for self-medication. Treatment  of underlying problems will usually eliminate insomnia problems.  Medications can be prescribed for short time use. They are generally not recommended for lengthy use.  Over-the-counter sleep medicines are not recommended for lengthy use. They can be habit forming.  You can promote easier sleeping by making lifestyle changes such as:  Using relaxation techniques that help with breathing and reduce muscle tension.  Exercising earlier in the day.  Changing your diet and the time of your last meal. No night time snacks.  Establish a regular time to go to bed.  Counseling can help with stressful problems and worry.  Soothing music and white noise may be helpful if there are background noises you cannot remove.  Stop tedious detailed work at least one hour before bedtime. HOME CARE INSTRUCTIONS   Keep a diary. Inform your caregiver about your progress. This includes any medication side effects. See your caregiver regularly. Take note of:  Times when you are asleep.  Times when you are awake during the night.  The quality of your sleep.  How you feel the next day. This information will help your caregiver care for you.  Get out of bed if you are still awake after 15 minutes. Read or do some quiet activity. Keep the lights down. Wait until you feel sleepy and go back to bed.  Keep regular sleeping and waking hours. Avoid naps.  Exercise regularly.  Avoid distractions at bedtime. Distractions include watching television or engaging in any intense or detailed activity like attempting to balance the household checkbook.  Develop a bedtime ritual. Keep a familiar routine of bathing, brushing your teeth, climbing into bed at the same   time each night, listening to soothing music. Routines increase the success of falling to sleep faster.  Use relaxation techniques. This can be using breathing and muscle tension release routines. It can also include visualizing peaceful scenes. You can  also help control troubling or intruding thoughts by keeping your mind occupied with boring or repetitive thoughts like the old concept of counting sheep. You can make it more creative like imagining planting one beautiful flower after another in your backyard garden.  During your day, work to eliminate stress. When this is not possible use some of the previous suggestions to help reduce the anxiety that accompanies stressful situations. MAKE SURE YOU:   Understand these instructions.  Will watch your condition.  Will get help right away if you are not doing well or get worse. Document Released: 06/23/2000 Document Revised: 09/18/2011 Document Reviewed: 07/24/2007 [employer] Patient Information 2015 [employer]. This information is not intended to replace advice given to you by your health care provider. Make sure you discuss any questions you have with your health care provider.  

## 2014-08-11 LAB — LIPID PANEL
CHOL/HDL RATIO: 4
Cholesterol: 207 mg/dL — ABNORMAL HIGH (ref 0–200)
HDL: 48.8 mg/dL (ref >39.00)
NonHDL: 158.2
Triglycerides: 237 mg/dL — ABNORMAL HIGH (ref 0.0–149.0)
VLDL: 47.4 mg/dL — AB (ref 0.0–40.0)

## 2014-08-11 LAB — CBC WITH DIFFERENTIAL/PLATELET
BASOS PCT: 0.9 % (ref 0.0–3.0)
Basophils Absolute: 0.1 10*3/uL (ref 0.0–0.1)
Eosinophils Absolute: 0.5 10*3/uL (ref 0.0–0.7)
Eosinophils Relative: 7.5 % — ABNORMAL HIGH (ref 0.0–5.0)
HCT: 39.4 % (ref 36.0–46.0)
Hemoglobin: 13.4 g/dL (ref 12.0–15.0)
Lymphocytes Relative: 29 % (ref 12.0–46.0)
Lymphs Abs: 1.8 10*3/uL (ref 0.7–4.0)
MCHC: 33.9 g/dL (ref 30.0–36.0)
MCV: 90.7 fl (ref 78.0–100.0)
MONOS PCT: 10.9 % (ref 3.0–12.0)
Monocytes Absolute: 0.7 10*3/uL (ref 0.1–1.0)
Neutro Abs: 3.2 10*3/uL (ref 1.4–7.7)
Neutrophils Relative %: 51.7 % (ref 43.0–77.0)
PLATELETS: 262 10*3/uL (ref 150.0–400.0)
RBC: 4.34 Mil/uL (ref 3.87–5.11)
RDW: 14.1 % (ref 11.5–15.5)
WBC: 6.3 10*3/uL (ref 4.0–10.5)

## 2014-08-11 LAB — BASIC METABOLIC PANEL
BUN: 20 mg/dL (ref 6–23)
CHLORIDE: 103 meq/L (ref 96–112)
CO2: 32 mEq/L (ref 19–32)
Calcium: 10.1 mg/dL (ref 8.4–10.5)
Creatinine, Ser: 0.89 mg/dL (ref 0.40–1.20)
GFR: 65.28 mL/min (ref >60.00)
Glucose, Bld: 101 mg/dL — ABNORMAL HIGH (ref 70–99)
POTASSIUM: 3.6 meq/L (ref 3.5–5.1)
SODIUM: 142 meq/L (ref 135–145)

## 2014-08-11 LAB — HEPATIC FUNCTION PANEL
ALT: 23 U/L (ref 0–35)
AST: 21 U/L (ref 0–37)
Albumin: 4 g/dL (ref 3.5–5.2)
Alkaline Phosphatase: 63 U/L (ref 39–117)
BILIRUBIN DIRECT: 0.1 mg/dL (ref 0.0–0.3)
Total Bilirubin: 0.7 mg/dL (ref 0.2–1.2)
Total Protein: 6.3 g/dL (ref 6.0–8.3)

## 2014-08-11 LAB — TSH: TSH: 3.15 u[IU]/mL (ref 0.35–4.50)

## 2014-08-11 LAB — LDL CHOLESTEROL, DIRECT: LDL DIRECT: 110 mg/dL

## 2014-08-11 NOTE — Addendum Note (Signed)
Addended by: Elmer Picker on: 08/11/2014 08:13 AM   Modules accepted: Orders

## 2014-08-31 ENCOUNTER — Ambulatory Visit (INDEPENDENT_AMBULATORY_CARE_PROVIDER_SITE_OTHER): Payer: Medicare HMO | Admitting: Family Medicine

## 2014-08-31 ENCOUNTER — Encounter: Payer: Self-pay | Admitting: Family Medicine

## 2014-08-31 VITALS — BP 145/76 | HR 86 | Temp 98.5°F | Ht 65.0 in | Wt 200.0 lb

## 2014-08-31 DIAGNOSIS — J40 Bronchitis, not specified as acute or chronic: Secondary | ICD-10-CM

## 2014-08-31 MED ORDER — AMOXICILLIN-POT CLAVULANATE 875-125 MG PO TABS: 1.0000 | ORAL_TABLET | Freq: Two times a day (BID) | ORAL | Status: DC

## 2014-08-31 NOTE — Progress Notes (Signed)
   Subjective:    Patient ID: Sheila Shields, female    DOB: Jul 10, 1935, 80 y.o.   MRN: [id number]  HPI Here for another bout of chest congestion and coughing up yellow sputum. Using her inhalers. No fever. She was here for a similar sx last month and seemed to get better with a Zpack.    Review of Systems  Constitutional: Negative.   HENT: Positive for congestion. Negative for postnasal drip and sinus pressure.   Eyes: Negative.   Respiratory: Positive for cough, shortness of breath and wheezing.        Objective:   Physical Exam  Constitutional: She appears well-developed and well-nourished.  HENT:  Right Ear: External ear normal.  Left Ear: External ear normal.  Nose: Nose normal.  Mouth/Throat: Oropharynx is clear and moist.  Eyes: Conjunctivae are normal.  Pulmonary/Chest: Effort normal. No respiratory distress. She has no rales.  Scattered rhonchi and wheezes   Lymphadenopathy:    She has no cervical adenopathy.          Assessment & Plan:  Add Mucinex

## 2014-08-31 NOTE — Progress Notes (Signed)
Pre visit review using our clinic review tool, if applicable. No additional management support is needed unless otherwise documented below in the visit note. 

## 2014-09-07 ENCOUNTER — Telehealth: Payer: Self-pay

## 2014-09-07 ENCOUNTER — Encounter: Payer: Self-pay | Admitting: Family

## 2014-09-07 NOTE — Telephone Encounter (Signed)
Error

## 2014-09-07 NOTE — Telephone Encounter (Signed)
MyChart message from pt:  I hope someone is monitoring this email. My pharmacist at [facility], [facility], has advised me that taking Mobic for my arthritis would be more cost effective than the Diclofenac I have been taking. Is there a reason for not prescribing this medicine? If I can take it, do I continue to take the misoprostol, or can I discontinue that which would again be cost effective.  I am a "floater" at [facility] right now and it is really uncomfortable. Sheila Shields left, Sheila Shields left, and I can't get an appointment to "be established" with Sheila Shields until December 12th. I thought I was "established" since I have been a patient at [facility] for over 10 years.  I hope to get some response since I have no doctor, but need my medicine, and if Mobic will take care of my arthritis and be cost effective at the same time, PLEASE, help me.

## 2014-09-08 ENCOUNTER — Other Ambulatory Visit: Payer: Self-pay | Admitting: Family

## 2014-09-08 MED ORDER — MELOXICAM 7.5 MG PO TABS
7.5000 mg | ORAL_TABLET | Freq: Every day | ORAL | Status: DC
Start: 2014-09-08 — End: 2014-12-09

## 2014-09-10 ENCOUNTER — Other Ambulatory Visit: Payer: Self-pay | Admitting: Internal Medicine

## 2014-09-17 ENCOUNTER — Other Ambulatory Visit: Payer: Self-pay | Admitting: Family

## 2014-09-17 MED ORDER — CLARITHROMYCIN 500 MG PO TABS: 500.0000 mg | ORAL_TABLET | Freq: Two times a day (BID) | ORAL | Status: DC

## 2014-09-17 NOTE — Telephone Encounter (Signed)
Call in Biaxin 500 mg bid for 10 days

## 2014-09-17 NOTE — Telephone Encounter (Signed)
Called and spoke with pt and pt is aware.  Rx sent to [facility].

## 2014-09-17 NOTE — Telephone Encounter (Signed)
Pt saw dr fry on 2/22. Has ongoing productive cough, green phlegm for several weeks. Keeps going all day. Pt finished med a week ago and 3 days it came back. Pt would like to know if there is something else he could call in? [facility]

## 2014-10-12 ENCOUNTER — Encounter: Payer: Self-pay | Admitting: Family Medicine

## 2014-10-12 ENCOUNTER — Ambulatory Visit (INDEPENDENT_AMBULATORY_CARE_PROVIDER_SITE_OTHER)
Admission: RE | Admit: 2014-10-12 | Discharge: 2014-10-12 | Disposition: A | Discharge status: Home / Self Care | Payer: Medicare HMO | Source: Ambulatory Visit | Attending: Family Medicine | Admitting: Family Medicine

## 2014-10-12 ENCOUNTER — Ambulatory Visit (INDEPENDENT_AMBULATORY_CARE_PROVIDER_SITE_OTHER): Payer: Medicare HMO | Admitting: Family Medicine

## 2014-10-12 VITALS — BP 102/70 | HR 84 | Temp 97.4°F | Ht 65.0 in | Wt 196.8 lb

## 2014-10-12 DIAGNOSIS — R05 Cough: Secondary | ICD-10-CM

## 2014-10-12 DIAGNOSIS — R053 Chronic cough: Secondary | ICD-10-CM

## 2014-10-12 MED ORDER — PREDNISONE 20 MG PO TABS: 40.0000 mg | ORAL_TABLET | Freq: Every day | ORAL | Status: DC

## 2014-10-12 NOTE — Patient Instructions (Addendum)
BEFORE YOU LEAVE: -xray sheet  Go get chest xray  Take the course of prednisone  We placed a referral for you as discussed to the pulmonologist per your request. It usually takes about 1-2 weeks to process and schedule this referral. If you have not heard from [place] regarding this appointment in 2 weeks please contact our office.

## 2014-10-12 NOTE — Progress Notes (Signed)
HPI:  COPD/URI: -started: a few weeks ago - but chronic productive cough for months, worse at night -symptoms:sore throat, cough -denies:fever, SOB, NVD, tooth pain, wheezing -has tried: not taking any daily inhalers for her COPD, using albuterol once daily -sick contacts/travel/risks: denies flu exposure, tick exposure or or Ebola risks -Hx of: COPD and allergies - saw allergist last week for this and told does not have allergies -reports remote smoking history - quit 30 years ago; she and her husband would like to see the pulmonologist for her ongoing cough as has had 3 episodes in the last 3 months of sig worsening and daily cough  ROS: See pertinent positives and negatives per HPI.  Past Medical History  Diagnosis Date  . HYPERLIPIDEMIA 12/17/2006  . ANXIETY 12/18/2006  . ACUTE ALCOHOLIC INTOXICATION IN REMISSION 08/27/2007  . DEPRESSION 12/18/2006  . HYPERTENSION 12/17/2006  . COPD 12/17/2006  . GERD 05/01/2007  . ARTHRITIS 02/23/2010  . HYPERGLYCEMIA 08/27/2007  . Mood disorder   . Arthritis   . Rectal polyp 03/27/2007    adenoma  . Diverticulosis 03/27/2007    Past Surgical History  Procedure Laterality Date  . Breast biopsy      negative  . Hammer toe surgery  2008    left foot  . Colonoscopy w/ polypectomy  03/27/2007; n7/19/2012    2008: 4 mm adenoma, diverticulosis 2012: ileitis ? NSAID - likely, 2-3 mm cecal polyp LYMPHOID FOLLICLE, diverticulosis  . Esophagogastroduodenoscopy  01/26/2011    reflux esophagitis, colonscopy done also  . Appendectomy  2002  . Abdominal hysterectomy  1978    fibroma  . Carpal tunnel release Left 2010 or 2011  . Shoulder open rotator cuff repair Left 03/06/2013    Procedure: LEFT ROTATOR CUFF REPAIR, SUBACROMIAL DECOMPRESSION, PATCH GRAFT, MANIPULATION UNDER ANESTHESIA;  Surgeon: Johnn Hai, MD;  Location: [facility];  Service: Orthopedics;  Laterality: Left;    Family History  Problem Relation Age of Onset  . Throat cancer Mother   .  Heart attack Father 5  . Idiopathic pulmonary fibrosis Brother 61  . Cancer Brother     sarcoma  . Colon cancer Neg Hx     History   Social History  . Marital Status: Married    Spouse Name: N/A  . Number of Children: 2  . Years of Education: N/A   Occupational History  . retired    Social History Main Topics  . Smoking status: Former Smoker -- 2.00 packs/day for 30 years    Types: Cigarettes    Quit date: 07/10/1984  . Smokeless tobacco: Never Used  . Alcohol Use: No     Comment: no alcoho since 1983  . Drug Use: No  . Sexual Activity: Not on file   Other Topics Concern  . None   Social History Narrative     Current outpatient prescriptions:  .  albuterol (PROVENTIL HFA;VENTOLIN HFA) 108 (90 BASE) MCG/ACT inhaler, Inhale 2 puffs into the lungs every 6 (six) hours as needed for wheezing., Disp: 1 Inhaler, Rfl: 2 .  aspirin EC 81 MG tablet, Take 81 mg by mouth daily., Disp: , Rfl:  .  Calcium Carbonate-Vitamin D (CALTRATE 600+D) 600-400 MG-UNIT per tablet, Take 1 tablet by mouth 2 (two) times daily. , Disp: , Rfl:  .  ferrous sulfate 325 (65 FE) MG tablet, TAKE 1 TABLET BY MOUTH EVERY DAY, Disp: 90 tablet, Rfl: 3 .  fish oil-omega-3 fatty acids 1000 MG capsule, Take 2 g by  mouth daily., Disp: , Rfl:  .  KLOR-CON M20 20 MEQ tablet, TAKE 1 TABLET BY MOUTH EVERY DAY, Disp: 90 tablet, Rfl: 3 .  lansoprazole (PREVACID) 15 MG capsule, Take 1 capsule (15 mg total) by mouth daily., Disp: 90 capsule, Rfl: 3 .  losartan-hydrochlorothiazide (HYZAAR) 100-12.5 MG per tablet, Take 1 tablet by mouth daily., Disp: 30 tablet, Rfl: 3 .  meloxicam (MOBIC) 7.5 MG tablet, Take 1 tablet (7.5 mg total) by mouth daily., Disp: 30 tablet, Rfl: 3 .  Multiple Vitamin (MULTIVITAMIN WITH MINERALS) TABS tablet, Take 1 tablet by mouth daily., Disp: , Rfl:  .  Olopatadine HCl 0.6 % SOLN, , Disp: , Rfl: 5 .  simvastatin (ZOCOR) 20 MG tablet, Take 1 tablet (20 mg total) by mouth every morning., Disp: 90  tablet, Rfl: 3 .  predniSONE (DELTASONE) 20 MG tablet, Take 2 tablets (40 mg total) by mouth daily with breakfast., Disp: 8 tablet, Rfl: 0  EXAM:  Filed Vitals:   10/12/14 1056  BP: 102/70  Pulse: 84  Temp: 97.4 F (36.3 C)    Body mass index is 32.75 kg/(m^2).  GENERAL: vitals reviewed and listed above, alert, oriented, appears well hydrated and in no acute distress  HEENT: atraumatic, conjunttiva clear, no obvious abnormalities on inspection of external nose and ears, normal appearance of ear canals and TMs, clear nasal congestion, mild post oropharyngeal erythema with PND, no tonsillar edema or exudate, no sinus TTP  NECK: no obvious masses on inspection  LUNGS: no signs of resp distress on exam, O2 on RA remains 94%, difuse exp wheeze   CV: HRRR, no peripheral edema  MS: moves all extremities without noticeable abnormality  PSYCH: pleasant and cooperative, no obvious depression or anxiety  ASSESSMENT AND PLAN:  Discussed the following assessment and plan:  Chronic cough - Plan: DG Chest 2 View, predniSONE (DELTASONE) 20 MG tablet, Ambulatory referral to Pulmonology  -discussed common causes of chronic cough -with her hx and wheezing suspect asthma/COPD most likely - they wish to see pulmonologist and referral placed; reports not on any daily treatments for this and likely needs to be -she does have GERD and is not on tx for this -CXR acutely and prednisone burst - risks discussed -return precuations   -of course, we advised to return or notify a doctor immediately if symptoms worsen or persist or new concerns arise.    Patient Instructions  BEFORE YOU LEAVE: -xray sheet  Go get chest xray  We placed a referral for you as discussed to the pulmonologist per your request. It usually takes about 1-2 weeks to process and schedule this referral. If you have not heard from [place] regarding this appointment in 2 weeks please contact our office.      Colin Benton  R.

## 2014-10-12 NOTE — Progress Notes (Signed)
Pre visit review using our clinic review tool, if applicable. No additional management support is needed unless otherwise documented below in the visit note. 

## 2014-10-14 ENCOUNTER — Encounter: Payer: Self-pay | Admitting: Family Medicine

## 2014-10-14 ENCOUNTER — Other Ambulatory Visit: Payer: Self-pay | Admitting: Family Medicine

## 2014-10-14 DIAGNOSIS — Z1231 Encounter for screening mammogram for malignant neoplasm of breast: Secondary | ICD-10-CM

## 2014-10-14 MED ORDER — GUAIFENESIN-CODEINE 100-10 MG/5ML PO SYRP: 5.0000 mL | ORAL_SOLUTION | Freq: Every evening | ORAL | Status: DC | PRN

## 2014-10-14 NOTE — Telephone Encounter (Signed)
I called the pt and she stated she had a reaction to Dilaudid years ago after shoulder surgery but this was not major like an anaphylactic reaction.  She is aware the Rx will be left at the front desk for her to pick up.

## 2014-10-14 NOTE — Telephone Encounter (Signed)
Sheila Shields,   Her chart shows allergy to dilaudid. Has she taken a cough medication with codien withoute reaction? If so and reaction to dilaudid mild can due cough medication per pended orders. Thanks.

## 2014-10-20 ENCOUNTER — Encounter: Payer: Self-pay | Admitting: Family Medicine

## 2014-10-20 MED ORDER — ALBUTEROL SULFATE HFA 108 (90 BASE) MCG/ACT IN AERS: INHALATION_SPRAY | RESPIRATORY_TRACT | Status: DC

## 2014-10-20 NOTE — Telephone Encounter (Signed)
I called the pt and informed her of the message below and she is aware the inhaler was sent to her pharmacy.

## 2014-10-23 ENCOUNTER — Ambulatory Visit (HOSPITAL_COMMUNITY)
Admission: RE | Admit: 2014-10-23 | Discharge: 2014-10-23 | Disposition: A | Discharge status: Home / Self Care | Payer: Medicare HMO | Source: Ambulatory Visit | Attending: Family Medicine | Admitting: Family Medicine

## 2014-10-23 DIAGNOSIS — Z1231 Encounter for screening mammogram for malignant neoplasm of breast: Secondary | ICD-10-CM | POA: Insufficient documentation

## 2014-11-05 ENCOUNTER — Other Ambulatory Visit: Payer: Self-pay | Admitting: Internal Medicine

## 2014-11-18 ENCOUNTER — Ambulatory Visit (INDEPENDENT_AMBULATORY_CARE_PROVIDER_SITE_OTHER): Payer: Medicare HMO | Admitting: Emergency Medicine

## 2014-11-18 ENCOUNTER — Encounter: Payer: Self-pay | Admitting: Emergency Medicine

## 2014-11-18 VITALS — BP 124/72 | HR 80 | Ht 65.5 in | Wt 199.0 lb

## 2014-11-18 DIAGNOSIS — R053 Chronic cough: Secondary | ICD-10-CM

## 2014-11-18 DIAGNOSIS — J449 Chronic obstructive pulmonary disease, unspecified: Secondary | ICD-10-CM | POA: Insufficient documentation

## 2014-11-18 DIAGNOSIS — R05 Cough: Secondary | ICD-10-CM

## 2014-11-18 MED ORDER — LORATADINE 10 MG PO TABS: 10.0000 mg | ORAL_TABLET | Freq: Every day | ORAL | Status: DC

## 2014-11-18 MED ORDER — FLUTICASONE PROPIONATE 50 MCG/ACT NA SUSP: 2.0000 | Freq: Every day | NASAL | Status: DC

## 2014-11-18 NOTE — Assessment & Plan Note (Signed)
With contributors of GERD and also probably allergic rhinitis. She did improve somewhat after she was started on Prevacid. He has not been treated for both of these exacerbating factor simultaneously I believe she will benefit the most and we do this. We discussed causes of upper airway irritation and cough in detail. I recommended starting ranitidine and fluticasone nasal spray, increasing her Prevacid to twice a day and continuing Robitussin for cough suppression. I counseled her extensively on avoiding throat clearing, and intentional coughing. Also recommended voice rest. Over 50% of this 30 minute visit was spent in counseling.

## 2014-11-18 NOTE — Assessment & Plan Note (Signed)
Mild COPD based on her symptoms and her spirometry from April 2015. She does have frequent bouts of bronchitis particularly in the spring allergy season. I suspect that these relate to her COPD as well. Her current cough syndrome started as one of these flares. For now I like to continue her albuterol as needed. We will perform repeat pulmonary function testing at some point in the future and consider scheduled bronchodilators depending on her status with this cough syndrome has resolved

## 2014-11-18 NOTE — Patient Instructions (Signed)
Please continue to keep your albuterol available to use if needed for shortness of breath Increase your Prevacid to 15 mg twice a day until our next visit Start taking Loratadine 10 mg once a day Start taking fluticasone nasal spray, 2 sprays each nostril once a day. Do not take right before bedtime as this will make the medicine drain down the back of your throat Use your Robitussin for cough suppression We will not restart prednisone at this time but we may need to do so in the future in order to suppress your cough Try to avoid throat clearing and intentional coughing Try to set aside a time for voice rest possibly as long as a weekend Follow with Dr Lamonte Sakai in 1 month

## 2014-11-18 NOTE — Progress Notes (Signed)
Subjective:    Patient ID: Sheila Shields, female    DOB: 1934/08/26, 80 y.o.   MRN: [id number]  HPI 80 year old former smoker (60 pack years) with a history of hypertension, hyperlipidemia, GERD and COPD that was dx by PFT in 10/2013 > mild AFL. She is referred today for evaluation of chronic and persistent cough. Her breathing has been good, but she notes that ambulation for long distances is difficult due to back pain and also breathing.   I reviewed her pulmonary function testing from 10/09/13 in full which showed mild obstruction and an FEV1 of 86% of predicted.   Began to have cough in setting of URI in January, she was treated with abx x 2. On both occasions she felt better temporarily. She had a persistent cough that is now dry, has been happening since after the bronchitis. She is on prevacid since a month ago > seemed to help the cough some. She was temporarily on allergy meds OTC but stopped these. She believes that the albuterol helps her cough.    Review of Systems  Constitutional: Negative for fever and unexpected weight change.  HENT: Positive for dental problem. Negative for congestion, ear pain, nosebleeds, postnasal drip, rhinorrhea, sinus pressure, sneezing, sore throat and trouble swallowing.   Eyes: Negative for redness and itching.  Respiratory: Positive for cough and shortness of breath. Negative for chest tightness and wheezing.   Cardiovascular: Negative for palpitations and leg swelling.  Gastrointestinal: Negative for nausea and vomiting.  Genitourinary: Negative for dysuria.  Musculoskeletal: Positive for myalgias and arthralgias. Negative for joint swelling.  Skin: Negative for rash.  Neurological: Negative for headaches.  Hematological: Does not bruise/bleed easily.  Psychiatric/Behavioral: Negative for dysphoric mood. The patient is not nervous/anxious.    Past Medical History  Diagnosis Date  . HYPERLIPIDEMIA 12/17/2006  . ANXIETY 12/18/2006  . ACUTE ALCOHOLIC  INTOXICATION IN REMISSION 08/27/2007  . DEPRESSION 12/18/2006  . HYPERTENSION 12/17/2006  . COPD 12/17/2006  . GERD 05/01/2007  . ARTHRITIS 02/23/2010  . HYPERGLYCEMIA 08/27/2007  . Mood disorder   . Arthritis   . Rectal polyp 03/27/2007    adenoma  . Diverticulosis 03/27/2007     Family History  Problem Relation Age of Onset  . Throat cancer Mother   . Heart attack Father 80  . Idiopathic pulmonary fibrosis Brother 5  . Cancer Brother     sarcoma  . Colon cancer Neg Hx      History   Social History  . Marital Status: Married    Spouse Name: N/A  . Number of Children: 2  . Years of Education: N/A   Occupational History  . retired    Social History Main Topics  . Smoking status: Former Smoker -- 2.00 packs/day for 30 years    Types: Cigarettes    Quit date: 07/10/1984  . Smokeless tobacco: Never Used  . Alcohol Use: No     Comment: no alcoho since 1983  . Drug Use: No  . Sexual Activity: Not on file   Other Topics Concern  . Not on file   Social History Narrative     Allergies  Allergen Reactions  . Dilaudid [Hydromorphone Hcl] Other (See Comments)    Pt had hypotension after dilaudid '1mg'$  IV while in the OR, required temporary pressor intervention.     Outpatient Prescriptions Prior to Visit  Medication Sig Dispense Refill  . albuterol (PROVENTIL HFA;VENTOLIN HFA) 108 (90 BASE) MCG/ACT inhaler 2 puffs every 4 hours as  needed for coughing spells 1 Inhaler 0  . aspirin EC 81 MG tablet Take 81 mg by mouth daily.    . Calcium Carbonate-Vitamin D (CALTRATE 600+D) 600-400 MG-UNIT per tablet Take 1 tablet by mouth 2 (two) times daily.     . ferrous sulfate 325 (65 FE) MG tablet TAKE 1 TABLET BY MOUTH EVERY DAY 90 tablet 3  . fish oil-omega-3 fatty acids 1000 MG capsule Take 2 g by mouth daily.    Marland Kitchen KLOR-CON M20 20 MEQ tablet TAKE 1 TABLET BY MOUTH EVERY DAY 90 tablet 3  . lansoprazole (PREVACID) 15 MG capsule Take 1 capsule (15 mg total) by mouth daily. 90 capsule 3    . losartan-hydrochlorothiazide (HYZAAR) 100-12.5 MG per tablet Take 1 tablet by mouth daily. 30 tablet 3  . meloxicam (MOBIC) 7.5 MG tablet Take 1 tablet (7.5 mg total) by mouth daily. 30 tablet 3  . Multiple Vitamin (MULTIVITAMIN WITH MINERALS) TABS tablet Take 1 tablet by mouth daily.    . simvastatin (ZOCOR) 20 MG tablet TAKE 1 TABLET (20 MG TOTAL) BY MOUTH EVERY MORNING. 90 tablet 1  . albuterol (PROVENTIL HFA;VENTOLIN HFA) 108 (90 BASE) MCG/ACT inhaler Inhale 2 puffs into the lungs every 6 (six) hours as needed for wheezing. 1 Inhaler 2  . guaiFENesin-codeine (ROBITUSSIN AC) 100-10 MG/5ML syrup Take 5 mLs by mouth at bedtime as needed for cough. 120 mL 0  . Olopatadine HCl 0.6 % SOLN   5  . predniSONE (DELTASONE) 20 MG tablet Take 2 tablets (40 mg total) by mouth daily with breakfast. 8 tablet 0   No facility-administered medications prior to visit.         Objective:   Physical Exam Filed Vitals:   11/18/14 1421 11/18/14 1422  BP:  124/72  Pulse:  80  Height: 5' 5.5" (1.664 m)   Weight: 199 lb (90.266 kg)   SpO2:  96%   Gen: Pleasant, well-nourished, in no distress,  normal affect  ENT: No lesions,  mouth clear, erythematous post pharynx, no postnasal drip  Neck: No JVD, no TMG, no carotid bruits  Lungs: No use of accessory muscles, clear without rales or rhonchi  Cardiovascular: RRR, heart sounds normal, no murmur or gallops, no peripheral edem  Musculoskeletal: No deformities, no cyanosis or clubbing  Neuro: alert, non focal  Skin: Warm, no lesions or rashes      Assessment & Plan:  COPD (chronic obstructive pulmonary disease) Mild COPD based on her symptoms and her spirometry from April 2015. She does have frequent bouts of bronchitis particularly in the spring allergy season. I suspect that these relate to her COPD as well. Her current cough syndrome started as one of these flares. For now I like to continue her albuterol as needed. We will perform repeat  pulmonary function testing at some point in the future and consider scheduled bronchodilators depending on her status with this cough syndrome has resolved   Chronic cough With contributors of GERD and also probably allergic rhinitis. She did improve somewhat after she was started on Prevacid. He has not been treated for both of these exacerbating factor simultaneously I believe she will benefit the most and we do this. We discussed causes of upper airway irritation and cough in detail. I recommended starting ranitidine and fluticasone nasal spray, increasing her Prevacid to twice a day and continuing Robitussin for cough suppression. I counseled her extensively on avoiding throat clearing, and intentional coughing. Also recommended voice rest. Over 50% of this  30 minute visit was spent in counseling.

## 2014-12-07 ENCOUNTER — Other Ambulatory Visit: Payer: Self-pay | Admitting: Family

## 2014-12-09 ENCOUNTER — Other Ambulatory Visit: Payer: Self-pay | Admitting: Family Medicine

## 2014-12-09 ENCOUNTER — Other Ambulatory Visit: Payer: Self-pay | Admitting: Family

## 2014-12-09 NOTE — Telephone Encounter (Signed)
Ok to Rx for bid?

## 2014-12-10 MED ORDER — MELOXICAM 7.5 MG PO TABS: 7.5000 mg | ORAL_TABLET | Freq: Two times a day (BID) | ORAL | Status: DC

## 2014-12-10 NOTE — Telephone Encounter (Signed)
Ok for bid, per [employer]

## 2014-12-10 NOTE — Addendum Note (Signed)
Addended by: Santiago Bumpers on: 12/10/2014 02:57 PM   Modules accepted: Orders

## 2014-12-22 ENCOUNTER — Emergency Department (HOSPITAL_COMMUNITY): Payer: Medicare HMO | Admitting: Anesthesiology

## 2014-12-22 ENCOUNTER — Emergency Department (HOSPITAL_COMMUNITY): Payer: Medicare HMO

## 2014-12-22 ENCOUNTER — Encounter (HOSPITAL_COMMUNITY): Payer: Self-pay | Admitting: *Deleted

## 2014-12-22 ENCOUNTER — Inpatient Hospital Stay: Admit: 2014-12-22 | Payer: Self-pay | Admitting: Specialist

## 2014-12-22 ENCOUNTER — Ambulatory Visit: Payer: Medicare HMO | Admitting: Emergency Medicine

## 2014-12-22 ENCOUNTER — Encounter (HOSPITAL_COMMUNITY)
Admission: EM | Disposition: A | Discharge status: Home / Self Care | Payer: Self-pay | Source: Home / Self Care | Attending: Specialist

## 2014-12-22 ENCOUNTER — Inpatient Hospital Stay (HOSPITAL_COMMUNITY)
Admission: EM | Admit: 2014-12-22 | Discharge: 2014-12-26 | DRG: 494 | Disposition: A | Discharge status: Home / Self Care | Payer: Medicare HMO | Attending: Specialist | Admitting: Specialist

## 2014-12-22 DIAGNOSIS — E785 Hyperlipidemia, unspecified: Secondary | ICD-10-CM | POA: Diagnosis present

## 2014-12-22 DIAGNOSIS — Z87891 Personal history of nicotine dependence: Secondary | ICD-10-CM | POA: Diagnosis not present

## 2014-12-22 DIAGNOSIS — F39 Unspecified mood [affective] disorder: Secondary | ICD-10-CM | POA: Diagnosis present

## 2014-12-22 DIAGNOSIS — J449 Chronic obstructive pulmonary disease, unspecified: Secondary | ICD-10-CM | POA: Diagnosis present

## 2014-12-22 DIAGNOSIS — W010XXA Fall on same level from slipping, tripping and stumbling without subsequent striking against object, initial encounter: Secondary | ICD-10-CM | POA: Diagnosis present

## 2014-12-22 DIAGNOSIS — M1990 Unspecified osteoarthritis, unspecified site: Secondary | ICD-10-CM | POA: Diagnosis present

## 2014-12-22 DIAGNOSIS — Z419 Encounter for procedure for purposes other than remedying health state, unspecified: Secondary | ICD-10-CM

## 2014-12-22 DIAGNOSIS — S82841B Displaced bimalleolar fracture of right lower leg, initial encounter for open fracture type I or II: Principal | ICD-10-CM | POA: Diagnosis present

## 2014-12-22 DIAGNOSIS — K219 Gastro-esophageal reflux disease without esophagitis: Secondary | ICD-10-CM | POA: Diagnosis present

## 2014-12-22 DIAGNOSIS — Z90710 Acquired absence of both cervix and uterus: Secondary | ICD-10-CM | POA: Diagnosis not present

## 2014-12-22 DIAGNOSIS — F419 Anxiety disorder, unspecified: Secondary | ICD-10-CM | POA: Diagnosis present

## 2014-12-22 DIAGNOSIS — Z885 Allergy status to narcotic agent status: Secondary | ICD-10-CM | POA: Diagnosis not present

## 2014-12-22 DIAGNOSIS — M25571 Pain in right ankle and joints of right foot: Secondary | ICD-10-CM | POA: Diagnosis present

## 2014-12-22 DIAGNOSIS — F329 Major depressive disorder, single episode, unspecified: Secondary | ICD-10-CM | POA: Diagnosis present

## 2014-12-22 DIAGNOSIS — I10 Essential (primary) hypertension: Secondary | ICD-10-CM | POA: Diagnosis present

## 2014-12-22 DIAGNOSIS — S82891B Other fracture of right lower leg, initial encounter for open fracture type I or II: Secondary | ICD-10-CM

## 2014-12-22 DIAGNOSIS — T148XXA Other injury of unspecified body region, initial encounter: Secondary | ICD-10-CM

## 2014-12-22 DIAGNOSIS — S8261XB Displaced fracture of lateral malleolus of right fibula, initial encounter for open fracture type I or II: Secondary | ICD-10-CM | POA: Diagnosis present

## 2014-12-22 DIAGNOSIS — W19XXXA Unspecified fall, initial encounter: Secondary | ICD-10-CM

## 2014-12-22 HISTORY — PX: ORIF ANKLE FRACTURE: SHX5408

## 2014-12-22 LAB — I-STAT CHEM 8, ED
BUN: 17 mg/dL (ref 6–20)
CALCIUM ION: 1.22 mmol/L (ref 1.13–1.30)
CREATININE: 0.8 mg/dL (ref 0.44–1.00)
Chloride: 105 mmol/L (ref 101–111)
GLUCOSE: 128 mg/dL — AB (ref 65–99)
HEMATOCRIT: 43 % (ref 36.0–46.0)
HEMOGLOBIN: 14.6 g/dL (ref 12.0–15.0)
Potassium: 3.6 mmol/L (ref 3.5–5.1)
Sodium: 141 mmol/L (ref 135–145)
TCO2: 24 mmol/L (ref 0–100)

## 2014-12-22 LAB — CBC WITH DIFFERENTIAL/PLATELET
Basophils Absolute: 0.1 10*3/uL (ref 0.0–0.1)
Basophils Relative: 0 % (ref 0–1)
EOS PCT: 4 % (ref 0–5)
Eosinophils Absolute: 0.4 10*3/uL (ref 0.0–0.7)
HCT: 41 % (ref 36.0–46.0)
Hemoglobin: 13.9 g/dL (ref 12.0–15.0)
LYMPHS ABS: 1.5 10*3/uL (ref 0.7–4.0)
LYMPHS PCT: 13 % (ref 12–46)
MCH: 30.5 pg (ref 26.0–34.0)
MCHC: 33.9 g/dL (ref 30.0–36.0)
MCV: 90.1 fL (ref 78.0–100.0)
Monocytes Absolute: 1 10*3/uL (ref 0.1–1.0)
Monocytes Relative: 9 % (ref 3–12)
NEUTROS ABS: 8.3 10*3/uL — AB (ref 1.7–7.7)
Neutrophils Relative %: 74 % (ref 43–77)
PLATELETS: 235 10*3/uL (ref 150–400)
RBC: 4.55 MIL/uL (ref 3.87–5.11)
RDW: 13.5 % (ref 11.5–15.5)
WBC: 11.2 10*3/uL — ABNORMAL HIGH (ref 4.0–10.5)

## 2014-12-22 SURGERY — OPEN REDUCTION INTERNAL FIXATION (ORIF) ANKLE FRACTURE
Anesthesia: General | Site: Ankle | Laterality: Right

## 2014-12-22 MED ORDER — EPHEDRINE SULFATE 50 MG/ML IJ SOLN
INTRAMUSCULAR | Status: AC
Start: 2014-12-22 — End: 2014-12-22
  Filled 2014-12-22: qty 1

## 2014-12-22 MED ORDER — FENTANYL CITRATE (PF) 100 MCG/2ML IJ SOLN
INTRAMUSCULAR | Status: AC
  Filled 2014-12-22: qty 2

## 2014-12-22 MED ORDER — MORPHINE SULFATE 2 MG/ML IJ SOLN: 1.0000 mg | INTRAMUSCULAR | Status: DC | PRN

## 2014-12-22 MED ORDER — MAGNESIUM CITRATE PO SOLN: 1.0000 | Freq: Once | ORAL | Status: AC | PRN

## 2014-12-22 MED ORDER — SODIUM CHLORIDE 0.9 % IR SOLN
Status: DC | PRN
  Administered 2014-12-22 (×2): 3000 mL

## 2014-12-22 MED ORDER — FERROUS SULFATE 325 (65 FE) MG PO TABS
325.0000 mg | ORAL_TABLET | Freq: Every day | ORAL | Status: DC
  Administered 2014-12-24 – 2014-12-25 (×2): 325 mg via ORAL
  Filled 2014-12-22 (×4): qty 1

## 2014-12-22 MED ORDER — FENTANYL CITRATE (PF) 100 MCG/2ML IJ SOLN
100.0000 ug | Freq: Once | INTRAMUSCULAR | Status: AC
  Administered 2014-12-22: 100 ug via INTRAVENOUS
  Filled 2014-12-22: qty 2

## 2014-12-22 MED ORDER — POLYETHYLENE GLYCOL 3350 17 G PO PACK: 17.0000 g | PACK | Freq: Every day | ORAL | Status: DC | PRN

## 2014-12-22 MED ORDER — DOCUSATE SODIUM 100 MG PO CAPS: 100.0000 mg | ORAL_CAPSULE | Freq: Two times a day (BID) | ORAL | Status: DC

## 2014-12-22 MED ORDER — ALBUTEROL SULFATE HFA 108 (90 BASE) MCG/ACT IN AERS
2.0000 | INHALATION_SPRAY | RESPIRATORY_TRACT | Status: DC | PRN
Start: 2014-12-22 — End: 2014-12-22

## 2014-12-22 MED ORDER — METHOCARBAMOL 500 MG PO TABS
500.0000 mg | ORAL_TABLET | Freq: Four times a day (QID) | ORAL | Status: DC | PRN
  Administered 2014-12-25: 500 mg via ORAL
  Filled 2014-12-22 (×2): qty 1

## 2014-12-22 MED ORDER — DEXTROSE 5 % IV SOLN
500.0000 mg | Freq: Four times a day (QID) | INTRAVENOUS | Status: DC | PRN
  Administered 2014-12-22: 500 mg via INTRAVENOUS
  Filled 2014-12-22 (×2): qty 5

## 2014-12-22 MED ORDER — LOSARTAN POTASSIUM 50 MG PO TABS
100.0000 mg | ORAL_TABLET | Freq: Every day | ORAL | Status: DC
  Administered 2014-12-24 – 2014-12-25 (×2): 100 mg via ORAL
  Filled 2014-12-22 (×4): qty 2

## 2014-12-22 MED ORDER — LORATADINE 10 MG PO TABS
10.0000 mg | ORAL_TABLET | Freq: Every day | ORAL | Status: DC
  Administered 2014-12-24 – 2014-12-25 (×2): 10 mg via ORAL
  Filled 2014-12-22 (×4): qty 1

## 2014-12-22 MED ORDER — ACETAMINOPHEN 650 MG RE SUPP: 650.0000 mg | Freq: Four times a day (QID) | RECTAL | Status: DC | PRN

## 2014-12-22 MED ORDER — FENTANYL CITRATE (PF) 100 MCG/2ML IJ SOLN
INTRAMUSCULAR | Status: AC
Start: 2014-12-22 — End: 2014-12-22
  Filled 2014-12-22: qty 2

## 2014-12-22 MED ORDER — PANTOPRAZOLE SODIUM 20 MG PO TBEC
20.0000 mg | DELAYED_RELEASE_TABLET | Freq: Every day | ORAL | Status: DC
  Administered 2014-12-23 – 2014-12-25 (×3): 20 mg via ORAL
  Filled 2014-12-22 (×4): qty 1

## 2014-12-22 MED ORDER — OXYCODONE HCL 5 MG PO TABS
5.0000 mg | ORAL_TABLET | ORAL | Status: DC | PRN
  Administered 2014-12-22 – 2014-12-23 (×3): 5 mg via ORAL
  Administered 2014-12-23 – 2014-12-24 (×2): 10 mg via ORAL
  Administered 2014-12-24 (×2): 5 mg via ORAL
  Administered 2014-12-25: 10 mg via ORAL
  Filled 2014-12-22: qty 1
  Filled 2014-12-22: qty 2
  Filled 2014-12-22: qty 1
  Filled 2014-12-22 (×2): qty 2
  Filled 2014-12-22 (×3): qty 1

## 2014-12-22 MED ORDER — ONDANSETRON HCL 4 MG PO TABS: 4.0000 mg | ORAL_TABLET | Freq: Four times a day (QID) | ORAL | Status: DC | PRN

## 2014-12-22 MED ORDER — FLUTICASONE PROPIONATE 50 MCG/ACT NA SUSP
2.0000 | Freq: Every day | NASAL | Status: DC
  Filled 2014-12-22: qty 16

## 2014-12-22 MED ORDER — SODIUM CHLORIDE 0.9 % IJ SOLN
INTRAMUSCULAR | Status: AC
  Filled 2014-12-22: qty 10

## 2014-12-22 MED ORDER — ETOMIDATE 2 MG/ML IV SOLN
INTRAVENOUS | Status: AC | PRN
  Administered 2014-12-22: 13 mg via INTRAVENOUS

## 2014-12-22 MED ORDER — OXYCODONE-ACETAMINOPHEN 5-325 MG PO TABS: 1.0000 | ORAL_TABLET | Freq: Four times a day (QID) | ORAL | Status: DC | PRN

## 2014-12-22 MED ORDER — ACETAMINOPHEN 325 MG PO TABS: 650.0000 mg | ORAL_TABLET | Freq: Four times a day (QID) | ORAL | Status: DC | PRN

## 2014-12-22 MED ORDER — ALBUTEROL SULFATE (2.5 MG/3ML) 0.083% IN NEBU: 2.5000 mg | INHALATION_SOLUTION | RESPIRATORY_TRACT | Status: DC | PRN

## 2014-12-22 MED ORDER — LIDOCAINE HCL (CARDIAC) 20 MG/ML IV SOLN
INTRAVENOUS | Status: AC
Start: 2014-12-22 — End: 2014-12-22
  Filled 2014-12-22: qty 5

## 2014-12-22 MED ORDER — ENOXAPARIN SODIUM 40 MG/0.4ML ~~LOC~~ SOLN
40.0000 mg | SUBCUTANEOUS | Status: DC
  Administered 2014-12-23 – 2014-12-26 (×4): 40 mg via SUBCUTANEOUS
  Filled 2014-12-22 (×8): qty 0.4

## 2014-12-22 MED ORDER — CEFAZOLIN SODIUM-DEXTROSE 2-3 GM-% IV SOLR
2.0000 g | INTRAVENOUS | Status: AC
  Administered 2014-12-22: 2 g via INTRAVENOUS
  Filled 2014-12-22: qty 50

## 2014-12-22 MED ORDER — BISACODYL 5 MG PO TBEC
5.0000 mg | DELAYED_RELEASE_TABLET | Freq: Every day | ORAL | Status: DC | PRN
  Administered 2014-12-25: 5 mg via ORAL
  Filled 2014-12-22: qty 1

## 2014-12-22 MED ORDER — DOCUSATE SODIUM 100 MG PO CAPS
100.0000 mg | ORAL_CAPSULE | Freq: Two times a day (BID) | ORAL | Status: DC
  Administered 2014-12-22 – 2014-12-25 (×6): 100 mg via ORAL
  Filled 2014-12-22: qty 1

## 2014-12-22 MED ORDER — POTASSIUM CHLORIDE IN NACL 20-0.9 MEQ/L-% IV SOLN
INTRAVENOUS | Status: DC
  Administered 2014-12-22 – 2014-12-24 (×2): via INTRAVENOUS
  Filled 2014-12-22 (×6): qty 1000

## 2014-12-22 MED ORDER — ONDANSETRON HCL 4 MG/2ML IJ SOLN
INTRAMUSCULAR | Status: DC | PRN
  Administered 2014-12-22: 4 mg via INTRAVENOUS

## 2014-12-22 MED ORDER — LACTATED RINGERS IV SOLN
INTRAVENOUS | Status: DC | PRN
  Administered 2014-12-22: 18:00:00 via INTRAVENOUS

## 2014-12-22 MED ORDER — MEPERIDINE HCL 50 MG/ML IJ SOLN: 6.2500 mg | INTRAMUSCULAR | Status: DC | PRN

## 2014-12-22 MED ORDER — METOCLOPRAMIDE HCL 5 MG/ML IJ SOLN: 5.0000 mg | Freq: Three times a day (TID) | INTRAMUSCULAR | Status: DC | PRN

## 2014-12-22 MED ORDER — PROPOFOL 10 MG/ML IV BOLUS
INTRAVENOUS | Status: AC
  Filled 2014-12-22: qty 20

## 2014-12-22 MED ORDER — PROMETHAZINE HCL 25 MG/ML IJ SOLN: 6.2500 mg | INTRAMUSCULAR | Status: DC | PRN

## 2014-12-22 MED ORDER — CEFAZOLIN SODIUM-DEXTROSE 2-3 GM-% IV SOLR
INTRAVENOUS | Status: AC
  Filled 2014-12-22: qty 50

## 2014-12-22 MED ORDER — TETANUS-DIPHTH-ACELL PERTUSSIS 5-2.5-18.5 LF-MCG/0.5 IM SUSP
0.5000 mL | Freq: Once | INTRAMUSCULAR | Status: AC
  Administered 2014-12-22: 0.5 mL via INTRAMUSCULAR
  Filled 2014-12-22: qty 0.5

## 2014-12-22 MED ORDER — ETOMIDATE 2 MG/ML IV SOLN
13.0000 mg | Freq: Once | INTRAVENOUS | Status: AC
  Administered 2014-12-22: 13 mg via INTRAVENOUS
  Filled 2014-12-22: qty 10

## 2014-12-22 MED ORDER — LOSARTAN POTASSIUM-HCTZ 100-12.5 MG PO TABS: 1.0000 | ORAL_TABLET | Freq: Every day | ORAL | Status: DC

## 2014-12-22 MED ORDER — ONDANSETRON HCL 4 MG/2ML IJ SOLN
INTRAMUSCULAR | Status: AC
  Filled 2014-12-22: qty 2

## 2014-12-22 MED ORDER — SODIUM CHLORIDE 0.9 % IR SOLN
Status: DC | PRN
  Administered 2014-12-22: 500 mL

## 2014-12-22 MED ORDER — POTASSIUM CHLORIDE CRYS ER 20 MEQ PO TBCR
20.0000 meq | EXTENDED_RELEASE_TABLET | Freq: Every day | ORAL | Status: DC
  Administered 2014-12-24 – 2014-12-25 (×2): 20 meq via ORAL
  Filled 2014-12-22 (×4): qty 1

## 2014-12-22 MED ORDER — ONDANSETRON HCL 4 MG/2ML IJ SOLN
4.0000 mg | Freq: Four times a day (QID) | INTRAMUSCULAR | Status: DC | PRN
  Administered 2014-12-23: 4 mg via INTRAVENOUS
  Filled 2014-12-22: qty 2

## 2014-12-22 MED ORDER — FENTANYL CITRATE (PF) 100 MCG/2ML IJ SOLN
50.0000 ug | Freq: Once | INTRAMUSCULAR | Status: AC
  Administered 2014-12-22: 50 ug via INTRAVENOUS
  Filled 2014-12-22: qty 2

## 2014-12-22 MED ORDER — PHENYLEPHRINE HCL 10 MG/ML IJ SOLN
INTRAMUSCULAR | Status: AC
  Filled 2014-12-22: qty 2

## 2014-12-22 MED ORDER — HYDROCHLOROTHIAZIDE 12.5 MG PO CAPS
12.5000 mg | ORAL_CAPSULE | Freq: Every day | ORAL | Status: DC
  Administered 2014-12-24 – 2014-12-25 (×2): 12.5 mg via ORAL
  Filled 2014-12-22 (×4): qty 1

## 2014-12-22 MED ORDER — DIPHENHYDRAMINE HCL 12.5 MG/5ML PO ELIX: 12.5000 mg | ORAL_SOLUTION | ORAL | Status: DC | PRN

## 2014-12-22 MED ORDER — LIDOCAINE HCL (CARDIAC) 20 MG/ML IV SOLN
INTRAVENOUS | Status: DC | PRN
  Administered 2014-12-22: 50 mg via INTRAVENOUS

## 2014-12-22 MED ORDER — EPHEDRINE SULFATE 50 MG/ML IJ SOLN
INTRAMUSCULAR | Status: DC | PRN
  Administered 2014-12-22: 5 mg via INTRAVENOUS

## 2014-12-22 MED ORDER — FENTANYL CITRATE (PF) 100 MCG/2ML IJ SOLN
25.0000 ug | INTRAMUSCULAR | Status: DC | PRN
  Administered 2014-12-22: 25 ug via INTRAVENOUS

## 2014-12-22 MED ORDER — FENTANYL CITRATE (PF) 100 MCG/2ML IJ SOLN
INTRAMUSCULAR | Status: DC | PRN
  Administered 2014-12-22: 25 ug via INTRAVENOUS
  Administered 2014-12-22: 50 ug via INTRAVENOUS
  Administered 2014-12-22 (×5): 25 ug via INTRAVENOUS

## 2014-12-22 MED ORDER — PHENYLEPHRINE HCL 10 MG/ML IJ SOLN
INTRAMUSCULAR | Status: DC | PRN
  Administered 2014-12-22 (×3): 40 ug via INTRAVENOUS

## 2014-12-22 MED ORDER — METOCLOPRAMIDE HCL 10 MG PO TABS: 5.0000 mg | ORAL_TABLET | Freq: Three times a day (TID) | ORAL | Status: DC | PRN

## 2014-12-22 MED ORDER — SODIUM CHLORIDE 0.9 % IR SOLN
Status: AC
  Filled 2014-12-22: qty 1

## 2014-12-22 MED ORDER — PROPOFOL 10 MG/ML IV BOLUS
INTRAVENOUS | Status: DC | PRN
  Administered 2014-12-22: 140 mg via INTRAVENOUS
  Administered 2014-12-22: 50 mg via INTRAVENOUS

## 2014-12-22 MED ORDER — DEXTROSE 5 % IV SOLN
1.0000 g | Freq: Once | INTRAVENOUS | Status: AC
  Administered 2014-12-22: 1 g via INTRAVENOUS
  Filled 2014-12-22: qty 10

## 2014-12-22 MED ORDER — ZOLPIDEM TARTRATE 5 MG PO TABS: 5.0000 mg | ORAL_TABLET | Freq: Every evening | ORAL | Status: DC | PRN

## 2014-12-22 MED ORDER — DEXTROSE 5 % IV SOLN
20.0000 mg | INTRAVENOUS | Status: DC | PRN
Start: 2014-12-22 — End: 2014-12-22
  Administered 2014-12-22: 10 ug/min via INTRAVENOUS

## 2014-12-22 MED ORDER — CEFAZOLIN SODIUM-DEXTROSE 2-3 GM-% IV SOLR
2.0000 g | Freq: Four times a day (QID) | INTRAVENOUS | Status: AC
  Administered 2014-12-22 – 2014-12-24 (×8): 2 g via INTRAVENOUS
  Filled 2014-12-22 (×8): qty 50

## 2014-12-22 SURGICAL SUPPLY — 64 items
ANCHOR SUT 1.45 SZ 1 SHORT (Anchor) ×4 IMPLANT
BAG ZIPLOCK 12X15 (MISCELLANEOUS) ×2 IMPLANT
BANDAGE ELASTIC 4 VELCRO ST LF (GAUZE/BANDAGES/DRESSINGS) IMPLANT
BANDAGE ELASTIC 6 VELCRO ST LF (GAUZE/BANDAGES/DRESSINGS) IMPLANT
BANDAGE ESMARK 6X9 LF (GAUZE/BANDAGES/DRESSINGS) ×1 IMPLANT
BIT DRILL 2.5X2.75 QC CALB (BIT) ×2 IMPLANT
BIT DRILL CALIBRATED 2.7 (BIT) ×2 IMPLANT
BNDG COHESIVE 6X5 TAN STRL LF (GAUZE/BANDAGES/DRESSINGS) ×2 IMPLANT
BNDG ESMARK 6X9 LF (GAUZE/BANDAGES/DRESSINGS) ×2
BNDG GAUZE ELAST 4 BULKY (GAUZE/BANDAGES/DRESSINGS) ×2 IMPLANT
CHLORAPREP W/TINT 26ML (MISCELLANEOUS) ×2 IMPLANT
CLEANER TIP ELECTROSURG 2X2 (MISCELLANEOUS) ×2 IMPLANT
CLOTH 2% CHLOROHEXIDINE 3PK (PERSONAL CARE ITEMS) ×2 IMPLANT
COVER MAYO STAND STRL (DRAPES) ×2 IMPLANT
CUFF TOURN SGL QUICK 34 (TOURNIQUET CUFF) ×1
CUFF TRNQT CYL 34X4X40X1 (TOURNIQUET CUFF) ×1 IMPLANT
DRAPE C-ARM 42X120 X-RAY (DRAPES) ×2 IMPLANT
DRAPE C-ARMOR (DRAPES) ×2 IMPLANT
DRAPE OEC MINIVIEW 54X84 (DRAPES) IMPLANT
DRAPE POUCH INSTRU U-SHP 10X18 (DRAPES) ×2 IMPLANT
DRAPE U-SHAPE 47X51 STRL (DRAPES) ×2 IMPLANT
DRSG ADAPTIC 3X8 NADH LF (GAUZE/BANDAGES/DRESSINGS) ×2 IMPLANT
DRSG PAD ABDOMINAL 8X10 ST (GAUZE/BANDAGES/DRESSINGS) IMPLANT
DURAPREP 26ML APPLICATOR (WOUND CARE) ×2 IMPLANT
ELECT REM PT RETURN 9FT ADLT (ELECTROSURGICAL) ×2
ELECTRODE REM PT RTRN 9FT ADLT (ELECTROSURGICAL) ×1 IMPLANT
GAUZE SPONGE 4X4 12PLY STRL (GAUZE/BANDAGES/DRESSINGS) ×2 IMPLANT
GLOVE BIOGEL PI IND STRL 7.5 (GLOVE) ×1 IMPLANT
GLOVE BIOGEL PI IND STRL 8 (GLOVE) ×1 IMPLANT
GLOVE BIOGEL PI INDICATOR 7.5 (GLOVE) ×1
GLOVE BIOGEL PI INDICATOR 8 (GLOVE) ×1
GLOVE SURG SS PI 7.5 STRL IVOR (GLOVE) ×2 IMPLANT
GLOVE SURG SS PI 8.0 STRL IVOR (GLOVE) ×4 IMPLANT
GOWN STRL REUS W/TWL XL LVL3 (GOWN DISPOSABLE) ×4 IMPLANT
KIT BASIN OR (CUSTOM PROCEDURE TRAY) ×2 IMPLANT
MANIFOLD NEPTUNE II (INSTRUMENTS) ×2 IMPLANT
NEEDLE HYPO 22GX1.5 SAFETY (NEEDLE) IMPLANT
PACK TOTAL JOINT (CUSTOM PROCEDURE TRAY) ×2 IMPLANT
PAD CAST 4YDX4 CTTN HI CHSV (CAST SUPPLIES) IMPLANT
PADDING CAST ABS 6INX4YD NS (CAST SUPPLIES) ×1
PADDING CAST ABS COTTON 6X4 NS (CAST SUPPLIES) ×1 IMPLANT
PADDING CAST COTTON 4X4 STRL (CAST SUPPLIES)
PADDING CAST SYN 6 (CAST SUPPLIES)
PADDING CAST SYNTHETIC 6X4 NS (CAST SUPPLIES) IMPLANT
PLATE LOCK 6H 139 RT DIST FIB (Plate) ×2 IMPLANT
POSITIONER SURGICAL ARM (MISCELLANEOUS) ×2 IMPLANT
PUTTY DBM STAGRAFT PLUS 5CC (Putty) ×2 IMPLANT
SCREW CORTICAL 3.5MM  20MM (Screw) ×2 IMPLANT
SCREW CORTICAL 3.5MM 18MM (Screw) ×10 IMPLANT
SCREW CORTICAL 3.5MM 20MM (Screw) ×2 IMPLANT
SCREW LOCK CORT STAR 3.5X14 (Screw) ×2 IMPLANT
SCREW LOCK CORT STAR 3.5X18 (Screw) ×4 IMPLANT
SCREW LOCK CORT STAR 3.5X20 (Screw) ×2 IMPLANT
STAPLER VISISTAT (STAPLE) ×2 IMPLANT
STOCKINETTE 8 INCH (MISCELLANEOUS) ×2 IMPLANT
SUCTION FRAZIER 12FR DISP (SUCTIONS) ×2 IMPLANT
SUT ETHILON 3 0 PS 1 (SUTURE) ×2 IMPLANT
SUT ETHILON 4 0 PS 2 18 (SUTURE) ×2 IMPLANT
SUT VIC AB 0 CT1 27 (SUTURE) ×1
SUT VIC AB 0 CT1 27XBRD ANTBC (SUTURE) ×1 IMPLANT
SUT VIC AB 2-0 CT1 27 (SUTURE) ×1
SUT VIC AB 2-0 CT1 TAPERPNT 27 (SUTURE) ×1 IMPLANT
SYR CONTROL 10ML LL (SYRINGE) IMPLANT
TUBING CONNECTING 10 (TUBING) ×2 IMPLANT

## 2014-12-22 NOTE — H&P (Signed)
Sheila Shields is an 80 y.o. female.   Chief Complaint: R ankle pain, deformity HPI: Pt of GOC well known to Dr. Tonita Cong s/p fall today with obvious ankle deformity, open fx, presented to ER where xrays confirmed fx dislocation. Pt last ate 830AM.  Past Medical History  Diagnosis Date  . HYPERLIPIDEMIA 12/17/2006  . ANXIETY 12/18/2006  . ACUTE ALCOHOLIC INTOXICATION IN REMISSION 08/27/2007  . DEPRESSION 12/18/2006  . HYPERTENSION 12/17/2006  . COPD 12/17/2006  . GERD 05/01/2007  . ARTHRITIS 02/23/2010  . HYPERGLYCEMIA 08/27/2007  . Mood disorder   . Arthritis   . Rectal polyp 03/27/2007    adenoma  . Diverticulosis 03/27/2007    Past Surgical History  Procedure Laterality Date  . Breast biopsy      negative  . Hammer toe surgery  2008    left foot  . Colonoscopy w/ polypectomy  03/27/2007; n7/19/2012    2008: 4 mm adenoma, diverticulosis 2012: ileitis ? NSAID - likely, 2-3 mm cecal polyp LYMPHOID FOLLICLE, diverticulosis  . Esophagogastroduodenoscopy  01/26/2011    reflux esophagitis, colonscopy done also  . Appendectomy  2002  . Abdominal hysterectomy  1978    fibroma  . Carpal tunnel release Left 2010 or 2011  . Shoulder open rotator cuff repair Left 03/06/2013    Procedure: LEFT ROTATOR CUFF REPAIR, SUBACROMIAL DECOMPRESSION, PATCH GRAFT, MANIPULATION UNDER ANESTHESIA;  Surgeon: Johnn Hai, MD;  Location: [facility];  Service: Orthopedics;  Laterality: Left;    Family History  Problem Relation Age of Onset  . Throat cancer Mother   . Heart attack Father 73  . Idiopathic pulmonary fibrosis Brother 66  . Cancer Brother     sarcoma  . Colon cancer Neg Hx    Social History:  reports that she quit smoking about 30 years ago. Her smoking use included Cigarettes. She has a 60 pack-year smoking history. She has never used smokeless tobacco. She reports that she does not drink alcohol or use illicit drugs.  Allergies:  Allergies  Allergen Reactions  . Dilaudid [Hydromorphone Hcl] Other  (See Comments)    Pt had hypotension after dilaudid '1mg'$  IV while in the OR, required temporary pressor intervention.     (Not in a hospital admission)  Results for orders placed or performed during the hospital encounter of 12/22/14 (from the past 48 hour(s))  CBC with Differential/Platelet     Status: Abnormal   Collection Time: 12/22/14  2:54 PM  Result Value Ref Range   WBC 11.2 (H) 4.0 - 10.5 K/uL   RBC 4.55 3.87 - 5.11 MIL/uL   Hemoglobin 13.9 12.0 - 15.0 g/dL   HCT 41.0 36.0 - 46.0 %   MCV 90.1 78.0 - 100.0 fL   MCH 30.5 26.0 - 34.0 pg   MCHC 33.9 30.0 - 36.0 g/dL   RDW 13.5 11.5 - 15.5 %   Platelets 235 150 - 400 K/uL   Neutrophils Relative % 74 43 - 77 %   Neutro Abs 8.3 (H) 1.7 - 7.7 K/uL   Lymphocytes Relative 13 12 - 46 %   Lymphs Abs 1.5 0.7 - 4.0 K/uL   Monocytes Relative 9 3 - 12 %   Monocytes Absolute 1.0 0.1 - 1.0 K/uL   Eosinophils Relative 4 0 - 5 %   Eosinophils Absolute 0.4 0.0 - 0.7 K/uL   Basophils Relative 0 0 - 1 %   Basophils Absolute 0.1 0.0 - 0.1 K/uL  I-stat chem 8, ed  Status: Abnormal   Collection Time: 12/22/14  2:58 PM  Result Value Ref Range   Sodium 141 135 - 145 mmol/L   Potassium 3.6 3.5 - 5.1 mmol/L   Chloride 105 101 - 111 mmol/L   BUN 17 6 - 20 mg/dL   Creatinine, Ser 0.80 0.44 - 1.00 mg/dL   Glucose, Bld 128 (H) 65 - 99 mg/dL   Calcium, Ion 1.22 1.13 - 1.30 mmol/L   TCO2 24 0 - 100 mmol/L   Hemoglobin 14.6 12.0 - 15.0 g/dL   HCT 43.0 36.0 - 46.0 %   Dg Ankle 2 Views Right  12/22/2014   CLINICAL DATA:  Fall, ankle injury, open fracture with protruding bone at RIGHT ankle, slipped walking down stairs about 3 steps from the bottom, initial encounter  EXAM: RIGHT ANKLE - 2 VIEW  COMPARISON:  None  FINDINGS: Bones appear demineralized.  Oblique comminuted fracture lateral malleolus displaced laterally and anteriorly.  Anterolateral tibiotalar dislocation.  Questionable fracture involving the articular margin of the distal tibia versus  displaced fracture fragment from the medial malleolus or medial margin of talus.  No additional fractures identified.  External rotation of foot versus tibial shaft.  Achilles insertion calcaneal spur formation.  IMPRESSION: Anterolateral tibiotalar dislocation.  Comminuted displaced lateral malleolar fracture.  Additional fracture fragment adjacent to distal tibial articular surface though uncertain if this is arising from the tibial plafond, medial malleolus or medial margin of talus.   Electronically Signed   By: Lavonia Dana M.D.   On: 12/22/2014 15:23    Review of Systems  Constitutional: Negative.   HENT: Negative.   Eyes: Negative.   Respiratory: Negative.   Gastrointestinal: Negative.   Genitourinary: Negative.   Musculoskeletal: Positive for joint pain.  Skin: Negative.   Neurological: Negative.     Blood pressure 146/55, pulse 82, temperature 97.5 F (36.4 C), temperature source Oral, resp. rate 12, SpO2 98 %. Physical Exam  Constitutional: She is oriented to person, place, and time. She appears well-developed and well-nourished.  HENT:  Head: Normocephalic and atraumatic.  Eyes: Conjunctivae are normal. Pupils are equal, round, and reactive to light.  Neck: Normal range of motion.  Cardiovascular: Normal rate.   Respiratory: Effort normal.  GI: Soft.  Musculoskeletal:  R ankle open wound medially, distal tibia visible, obvious deformity NVI distally Good cap refill  Neurological: She is alert and oriented to person, place, and time. She has normal reflexes.  Skin: Skin is warm and dry.  Psychiatric: She has a normal mood and affect.    xrays with anterolateral tibiotalar dislocation, tibia exposed to air medially. There is a comminuted displaced lateral malleolus fx as well as another fx fragment noted which could be originating from the distal tibia or the talus.  Assessment/Plan Open R ankle fx/dislocation  Plan keep NPO for OR now for I&D and ORIF R ankle Pre-op  EKG Ancef in ER, to receive another 2g Ancef pre-op in OR Dr. Tonita Cong will discuss risks, complications, alternatives with the pt, post-op procotols, recovery, possible need for repeat I&D, malunion, nonunion, infx. Will admit post-op for pain control and IV abx   Abhiraj Dozal M. PA-C for Dr. Tonita Cong 12/22/2014, 4:47 PM

## 2014-12-22 NOTE — ED Notes (Signed)
Pt's O2 sats dropped to 80% after administration of fentanyl, pt placed on 3L O2 via n/c, O2 sats 95%.

## 2014-12-22 NOTE — ED Notes (Signed)
Bed: BT51 Expected date:  Expected time:  Means of arrival:  Comments: EMS- 80yo F, fall, fractured ankle

## 2014-12-22 NOTE — Anesthesia Postprocedure Evaluation (Signed)
  Anesthesia Post-op Note  Patient: Sheila Shields  Procedure(s) Performed: Procedure(s) (LRB): OPEN REDUCTION INTERNAL FIXATION (ORIF) ANKLE FRACTURE (Right)  Patient Location: PACU  Anesthesia Type: General  Level of Consciousness: awake and alert   Airway and Oxygen Therapy: Patient Spontanous Breathing  Post-op Pain: mild  Post-op Assessment: Post-op Vital signs reviewed, Patient's Cardiovascular Status Stable, Respiratory Function Stable, Patent Airway and No signs of Nausea or vomiting  Last Vitals:  Filed Vitals:   12/22/14 2130  BP: 115/48  Pulse: 80  Temp: 36.1 C  Resp: 14    Post-op Vital Signs: stable   Complications: No apparent anesthesia complications

## 2014-12-22 NOTE — Transfer of Care (Signed)
Immediate Anesthesia Transfer of Care Note  Patient: Sheila Shields  Procedure(s) Performed: Procedure(s): OPEN REDUCTION INTERNAL FIXATION (ORIF) ANKLE FRACTURE (Right)  Patient Location: PACU  Anesthesia Type:General  Level of Consciousness: awake, alert , oriented and patient cooperative  Airway & Oxygen Therapy: Patient Spontanous Breathing and Patient connected to face mask oxygen  Post-op Assessment: Report given to RN, Post -op Vital signs reviewed and stable and Patient moving all extremities  Post vital signs: Reviewed and stable  Last Vitals:  Filed Vitals:   12/22/14 1710  BP: 175/100  Pulse: 114  Temp:   Resp: 15    Complications: No apparent anesthesia complications

## 2014-12-22 NOTE — ED Provider Notes (Signed)
CSN: [id number]     Arrival date & time 12/22/14  1400 History   First MD Initiated Contact with Patient 12/22/14 1429     Chief Complaint  Patient presents with  . Ankle Pain  . Fall     (Consider location/radiation/quality/duration/timing/severity/associated sxs/prior Treatment) Patient is a 80 y.o. female presenting with ankle pain and fall. The history is provided by the patient (pt fell down 3 stairs and hurt her right ankle.  ).  Ankle Pain Lower extremity pain location: right ankle. Injury: yes   Mechanism of injury: fall   Fall:    Fall occurred:  Down stairs Associated symptoms: no back pain and no fatigue   Fall Pertinent negatives include no chest pain, no abdominal pain and no headaches.    Past Medical History  Diagnosis Date  . HYPERLIPIDEMIA 12/17/2006  . ANXIETY 12/18/2006  . ACUTE ALCOHOLIC INTOXICATION IN REMISSION 08/27/2007  . DEPRESSION 12/18/2006  . HYPERTENSION 12/17/2006  . COPD 12/17/2006  . GERD 05/01/2007  . ARTHRITIS 02/23/2010  . HYPERGLYCEMIA 08/27/2007  . Mood disorder   . Arthritis   . Rectal polyp 03/27/2007    adenoma  . Diverticulosis 03/27/2007   Past Surgical History  Procedure Laterality Date  . Breast biopsy      negative  . Hammer toe surgery  2008    left foot  . Colonoscopy w/ polypectomy  03/27/2007; n7/19/2012    2008: 4 mm adenoma, diverticulosis 2012: ileitis ? NSAID - likely, 2-3 mm cecal polyp LYMPHOID FOLLICLE, diverticulosis  . Esophagogastroduodenoscopy  01/26/2011    reflux esophagitis, colonscopy done also  . Appendectomy  2002  . Abdominal hysterectomy  1978    fibroma  . Carpal tunnel release Left 2010 or 2011  . Shoulder open rotator cuff repair Left 03/06/2013    Procedure: LEFT ROTATOR CUFF REPAIR, SUBACROMIAL DECOMPRESSION, PATCH GRAFT, MANIPULATION UNDER ANESTHESIA;  Surgeon: Johnn Hai, MD;  Location: [facility];  Service: Orthopedics;  Laterality: Left;   Family History  Problem Relation Age of Onset  . Throat  cancer Mother   . Heart attack Father 48  . Idiopathic pulmonary fibrosis Brother 15  . Cancer Brother     sarcoma  . Colon cancer Neg Hx    History  Substance Use Topics  . Smoking status: Former Smoker -- 2.00 packs/day for 30 years    Types: Cigarettes    Quit date: 07/10/1984  . Smokeless tobacco: Never Used  . Alcohol Use: No     Comment: no alcoho since 1983   OB History    No data available     Review of Systems  Constitutional: Negative for appetite change and fatigue.  HENT: Negative for congestion, ear discharge and sinus pressure.   Eyes: Negative for discharge.  Respiratory: Negative for cough.   Cardiovascular: Negative for chest pain.  Gastrointestinal: Negative for abdominal pain and diarrhea.  Genitourinary: Negative for frequency and hematuria.  Musculoskeletal: Negative for back pain.       Ankle pain  Skin: Negative for rash.  Neurological: Negative for seizures and headaches.  Psychiatric/Behavioral: Negative for hallucinations.      Allergies  Dilaudid  Home Medications   Prior to Admission medications   Medication Sig Start Date End Date Taking? Authorizing Provider  aspirin EC 81 MG tablet Take 81 mg by mouth daily.   Yes Historical Provider, MD  Calcium Carbonate-Vitamin D (CALTRATE 600+D) 600-400 MG-UNIT per tablet Take 1 tablet by mouth 2 (two) times daily.  Yes Historical Provider, MD  clindamycin (CLEOCIN) 300 MG capsule Take 1 capsule by mouth every 8 (eight) hours. 12/18/14  Yes Historical Provider, MD  ferrous sulfate 325 (65 FE) MG tablet TAKE 1 TABLET BY MOUTH EVERY DAY 08/11/14  Yes Kennyth Arnold, FNP  fish oil-omega-3 fatty acids 1000 MG capsule Take 2 g by mouth daily.   Yes Historical Provider, MD  fluticasone (FLONASE) 50 MCG/ACT nasal spray Place 2 sprays into both nostrils daily. 11/18/14  Yes Collene Gobble, MD  KLOR-CON M20 20 MEQ tablet TAKE 1 TABLET BY MOUTH EVERY DAY 09/10/14  Yes Kennyth Arnold, FNP  lansoprazole (PREVACID)  15 MG capsule Take 1 capsule (15 mg total) by mouth daily. 09/05/13  Yes Lisabeth Pick, MD  loratadine (CLARITIN) 10 MG tablet Take 1 tablet (10 mg total) by mouth daily. 11/18/14  Yes Collene Gobble, MD  losartan-hydrochlorothiazide (HYZAAR) 100-12.5 MG per tablet TAKE 1 TABLET BY MOUTH DAILY. 12/08/14  Yes Kennyth Arnold, FNP  meloxicam (MOBIC) 7.5 MG tablet Take 1 tablet (7.5 mg total) by mouth 2 (two) times daily. 12/10/14  Yes Kennyth Arnold, FNP  Multiple Vitamin (MULTIVITAMIN WITH MINERALS) TABS tablet Take 1 tablet by mouth daily.   Yes Historical Provider, MD  PROAIR HFA 108 (90 BASE) MCG/ACT inhaler INHALE 2 PUFFS EVERY 4 HOURS AS NEEDED FOR COUGHING SPELLS 12/09/14  Yes Lucretia Kern, DO  simvastatin (ZOCOR) 20 MG tablet TAKE 1 TABLET (20 MG TOTAL) BY MOUTH EVERY MORNING. 11/05/14  Yes Kennyth Arnold, FNP   BP 127/67 mmHg  Pulse 77  Temp(Src) 97.5 F (36.4 C) (Oral)  Resp 19  SpO2 98% Physical Exam  Constitutional: She is oriented to person, place, and time. She appears well-developed.  HENT:  Head: Normocephalic.  Eyes: Conjunctivae and EOM are normal. No scleral icterus.  Neck: Neck supple. No thyromegaly present.  Cardiovascular: Normal rate and regular rhythm.  Exam reveals no gallop and no friction rub.   No murmur heard. Pulmonary/Chest: No stridor. She has no wheezes. She has no rales. She exhibits no tenderness.  Abdominal: She exhibits no distension. There is no tenderness. There is no rebound.  Musculoskeletal: She exhibits no edema.  Open fx right ankle,  dp 2plus.  Distal tibia sticking out of skin medially  Lymphadenopathy:    She has no cervical adenopathy.  Neurological: She is oriented to person, place, and time. She exhibits normal muscle tone. Coordination normal.  Skin: No rash noted. No erythema.  Psychiatric: She has a normal mood and affect. Her behavior is normal.    ED Course  Procedures (including critical care time) Labs Review Labs Reviewed  CBC WITH  DIFFERENTIAL/PLATELET - Abnormal; Notable for the following:    WBC 11.2 (*)    Neutro Abs 8.3 (*)    All other components within normal limits  I-STAT CHEM 8, ED - Abnormal; Notable for the following:    Glucose, Bld 128 (*)    All other components within normal limits    Imaging Review Dg Ankle 2 Views Right  12/22/2014   CLINICAL DATA:  Fall, ankle injury, open fracture with protruding bone at RIGHT ankle, slipped walking down stairs about 3 steps from the bottom, initial encounter  EXAM: RIGHT ANKLE - 2 VIEW  COMPARISON:  None  FINDINGS: Bones appear demineralized.  Oblique comminuted fracture lateral malleolus displaced laterally and anteriorly.  Anterolateral tibiotalar dislocation.  Questionable fracture involving the articular margin of the distal tibia  versus displaced fracture fragment from the medial malleolus or medial margin of talus.  No additional fractures identified.  External rotation of foot versus tibial shaft.  Achilles insertion calcaneal spur formation.  IMPRESSION: Anterolateral tibiotalar dislocation.  Comminuted displaced lateral malleolar fracture.  Additional fracture fragment adjacent to distal tibial articular surface though uncertain if this is arising from the tibial plafond, medial malleolus or medial margin of talus.   Electronically Signed   By: Lavonia Dana M.D.   On: 12/22/2014 15:23     EKG Interpretation None      MDM   Final diagnoses:  Fall    Open fx righ ankle    Milton Ferguson, MD 12/23/14 1730

## 2014-12-22 NOTE — Brief Op Note (Signed)
12/22/2014  8:47 PM  PATIENT:  Sheila Shields  80 y.o. female  PRE-OPERATIVE DIAGNOSIS:  RIGHT ANKLE FRACTURE  POST-OPERATIVE DIAGNOSIS:  right ankle fracture  PROCEDURE:  Procedure(s): OPEN REDUCTION INTERNAL FIXATION (ORIF) ANKLE FRACTURE (Right)  SURGEON:  Surgeon(s) and Role:    * Susa Day, MD - Primary  PHYSICIAN ASSISTANT:   ASSISTANTS: Bissell   ANESTHESIA:   general  EBL:  Total I/O In: -  Out: 100 [Blood:100]  BLOOD ADMINISTERED:none  DRAINS: none   LOCAL MEDICATIONS USED:  MARCAINE     SPECIMEN:  No Specimen  DISPOSITION OF SPECIMEN:  N/A  COUNTS:  YES  TOURNIQUET:    DICTATION: .Other Dictation: Dictation Number    [id number]  PLAN OF CARE: Admit to inpatient   PATIENT DISPOSITION:  PACU - hemodynamically stable.   Delay start of Pharmacological VTE agent (>24hrs) due to surgical blood loss or risk of bleeding: no

## 2014-12-22 NOTE — ED Notes (Addendum)
Per EMS pt coming from home with c/o fall, and ankle injury. EMS reports open fracture with bone protruding to right ankle. Pt sts she slipped while walking down the stairs.

## 2014-12-22 NOTE — Progress Notes (Signed)
EDCM spoke to patient briefly at bedside.  Patient confirms she lives at home with her husband.  Patient reports she has "multiple canes" at home.  Patient reports she is usually able to perform her own ADL's without difficulty.  Patient reports shw would like her pcp to be Dr. Colin Benton of [facility] [place] but her appointment to get established with Dr. Maudie Mercury isn't until December 12th.  Patient reports she has been seen by Dr. Maudie Mercury three times.  EDCM provided patient with list of home health agencies in [facility], explained services.  Patient to have procedure and xray done at bedside.  Patient thankful for services.  No further EDCM needs at this time.

## 2014-12-22 NOTE — Anesthesia Preprocedure Evaluation (Signed)
Anesthesia Evaluation  Patient identified by MRN, date of birth, ID band Patient awake    Reviewed: Allergy & Precautions, H&P , NPO status , Patient's Chart, lab work & pertinent test results  Airway Mallampati: II  TM Distance: >3 FB Neck ROM: full    Dental no notable dental hx.    Pulmonary neg pulmonary ROS, COPD COPD inhaler, former smoker,  breath sounds clear to auscultation  Pulmonary exam normal       Cardiovascular Exercise Tolerance: Good hypertension, Pt. on medications negative cardio ROS Normal cardiovascular examRhythm:regular Rate:Normal     Neuro/Psych negative neurological ROS  negative psych ROS   GI/Hepatic negative GI ROS, Neg liver ROS, GERD-  Medicated and Controlled,  Endo/Other  negative endocrine ROS  Renal/GU negative Renal ROS  negative genitourinary   Musculoskeletal   Abdominal   Peds  Hematology negative hematology ROS (+)   Anesthesia Other Findings   Reproductive/Obstetrics negative OB ROS                             Anesthesia Physical  Anesthesia Plan  ASA: III and emergent  Anesthesia Plan: General   Post-op Pain Management:    Induction:   Airway Management Planned: Oral ETT  Additional Equipment:   Intra-op Plan:   Post-operative Plan: Extubation in OR  Informed Consent: I have reviewed the patients History and Physical, chart, labs and discussed the procedure including the risks, benefits and alternatives for the proposed anesthesia with the patient or authorized representative who has indicated his/her understanding and acceptance.   Dental Advisory Given  Plan Discussed with: CRNA and Surgeon  Anesthesia Plan Comments:         Anesthesia Quick Evaluation

## 2014-12-22 NOTE — Anesthesia Procedure Notes (Signed)
Procedure Name: LMA Insertion Date/Time: 12/22/2014 6:28 PM Performed by: Glory Buff Pre-anesthesia Checklist: Patient identified, Emergency Drugs available, Suction available, Patient being monitored and Timeout performed Patient Re-evaluated:Patient Re-evaluated prior to inductionOxygen Delivery Method: Circle system utilized Preoxygenation: Pre-oxygenation with 100% oxygen Intubation Type: IV induction LMA: LMA inserted LMA Size: 4.0 Number of attempts: 1 Placement Confirmation: positive ETCO2 Tube secured with: Tape Dental Injury: Teeth and Oropharynx as per pre-operative assessment

## 2014-12-22 NOTE — Clinical Social Work Note (Signed)
Clinical Social Work Assessment  Patient Details  Name: Sheila Shields MRN: [id number] Date of Birth: 25-Jun-1935  Date of referral:  12/22/14               Reason for consult:   (Fall.)                Permission sought to share information with:   (None.) Permission granted to share information::  No  Name::        Agency::     Relationship::     Contact Information:     Housing/Transportation Living arrangements for the past 2 months:  Single Family Home (Patient informed CSW that she lives at home with her husband in [place].) Source of Information:  Patient Patient Interpreter Needed:  None Criminal Activity/Legal Involvement Pertinent to Current Situation/Hospitalization:  No - Comment as needed Significant Relationships:  Spouse Lives with:  Spouse Do you feel safe going back to the place where you live?  Yes Need for family participation in patient care:   (Patient informed CSW that prior to fall she completed her ADL's independently.)  Care giving concerns:  There are no care giving concerns at this time. The pt informed CSW that she lives at home with her Husband in [place], and has been functioning independently.   Social Worker assessment / plan:  CSW met with pt at bedside. Patient confirms that she fell today while at home. Patient stated " My food slipped on the stairs." Patient states that she does not fall often.  Patient informed CSW that prior to falling. She has been able to complete her ADL's independently.   Patient informed CSW that she is not interested in facility. Patient stated " Id rather have home health." Also, pt informed CSW that Dr. Maxie Better is her orthopedist.   Patient and family states that she does not have any questions at this time.    Employment status:   Materials engineer.) Insurance information:    PT Recommendations:  Not assessed at this time Information / Referral to community resources:   (Patient informed CSW that she is not interested in  facility at this time, and would prefer home health.)  Patient/Family's Response to care:  Patient's is aware that she will be admitted, and her response is accepting at this time. Patient's family is also accepting at this time.  Patient/Family's Understanding of and Emotional Response to Diagnosis, Current Treatment, and Prognosis: Patient and family is aware of diagnosis, and know that the pt presents to [facility] due to fall.  Husband/ Charnee Turnipseed [phone]  Emotional Assessment Appearance:  Appears older than stated age Attitude/Demeanor/Rapport:   (Appropriate.) Affect (typically observed):  Accepting, Appropriate Orientation:  Oriented to Self, Oriented to Place, Oriented to  Time, Oriented to Situation Alcohol / Substance use:  Not Applicable Psych involvement (Current and /or in the community):  No (Comment)  Discharge Needs  Concerns to be addressed:  Adjustment to Illness Readmission within the last 30 days:  No Current discharge risk:  None Barriers to Discharge:  No Barriers Identified   Bernita Buffy, LCSW 12/22/2014, 4:07 PM

## 2014-12-22 NOTE — ED Provider Notes (Addendum)
Patient tripped and fell earlier today injuring her right ankle. Patient alert Glasgow Coma Score 15. On exam right lower extremity with obvious deformity at the ankle. With bone protruding from medial ankle. DP pulses 1+. Moves toes well. Procedure with sedation and reduction of fracture dislocation of the right ankle performed by me. Timeout performed. Procedure began 0998 p.m. Patient premedicated with etomidate 13 g IV., Achieving moderate sedation. Patient was arousable to tactile stimulus. Ankle was reduced by me using traction with adequate reduction. She was placed in a stirrup splint by orthopedic technician with my assisting and I adjusted the splint. DP pulse post's reduction was less 1. At 17 11 PM patient is alert Glasgow Coma Score 15. She went to the operating room for OR IF. X-rays reviewed by me  Orlie Dakin, MD 12/22/14 1722 ED ECG REPORT   Date: 12/22/2014  Rate: 85  Rhythm: normal sinus rhythm  QRS Axis: normal  Intervals: normal  ST/T Wave abnormalities: normal  Conduction Disutrbances:none  Narrative Interpretation:   Old EKG Reviewed: none available  I have personally reviewed the EKG tracing and agree with the computerized printout as noted. Results for orders placed or performed during the hospital encounter of 12/22/14  CBC with Differential/Platelet  Result Value Ref Range   WBC 11.2 (H) 4.0 - 10.5 K/uL   RBC 4.55 3.87 - 5.11 MIL/uL   Hemoglobin 13.9 12.0 - 15.0 g/dL   HCT 41.0 36.0 - 46.0 %   MCV 90.1 78.0 - 100.0 fL   MCH 30.5 26.0 - 34.0 pg   MCHC 33.9 30.0 - 36.0 g/dL   RDW 13.5 11.5 - 15.5 %   Platelets 235 150 - 400 K/uL   Neutrophils Relative % 74 43 - 77 %   Neutro Abs 8.3 (H) 1.7 - 7.7 K/uL   Lymphocytes Relative 13 12 - 46 %   Lymphs Abs 1.5 0.7 - 4.0 K/uL   Monocytes Relative 9 3 - 12 %   Monocytes Absolute 1.0 0.1 - 1.0 K/uL   Eosinophils Relative 4 0 - 5 %   Eosinophils Absolute 0.4 0.0 - 0.7 K/uL   Basophils Relative 0 0 - 1 %   Basophils Absolute 0.1 0.0 - 0.1 K/uL  I-stat chem 8, ed  Result Value Ref Range   Sodium 141 135 - 145 mmol/L   Potassium 3.6 3.5 - 5.1 mmol/L   Chloride 105 101 - 111 mmol/L   BUN 17 6 - 20 mg/dL   Creatinine, Ser 0.80 0.44 - 1.00 mg/dL   Glucose, Bld 128 (H) 65 - 99 mg/dL   Calcium, Ion 1.22 1.13 - 1.30 mmol/L   TCO2 24 0 - 100 mmol/L   Hemoglobin 14.6 12.0 - 15.0 g/dL   HCT 43.0 36.0 - 46.0 %   Dg Ankle 2 Views Right  12/22/2014   CLINICAL DATA:  Fall, ankle injury, open fracture with protruding bone at RIGHT ankle, slipped walking down stairs about 3 steps from the bottom, initial encounter  EXAM: RIGHT ANKLE - 2 VIEW  COMPARISON:  None  FINDINGS: Bones appear demineralized.  Oblique comminuted fracture lateral malleolus displaced laterally and anteriorly.  Anterolateral tibiotalar dislocation.  Questionable fracture involving the articular margin of the distal tibia versus displaced fracture fragment from the medial malleolus or medial margin of talus.  No additional fractures identified.  External rotation of foot versus tibial shaft.  Achilles insertion calcaneal spur formation.  IMPRESSION: Anterolateral tibiotalar dislocation.  Comminuted displaced lateral malleolar fracture.  Additional  fracture fragment adjacent to distal tibial articular surface though uncertain if this is arising from the tibial plafond, medial malleolus or medial margin of talus.   Electronically Signed   By: Lavonia Dana M.D.   On: 12/22/2014 15:23   Dg Chest Portable 1 View  12/22/2014   CLINICAL DATA:  Preoperative chest radiograph for left ankle surgery. Initial encounter.  EXAM: PORTABLE CHEST - 1 VIEW  COMPARISON:  Chest radiograph from 10/12/2014  FINDINGS: The lungs are well-aerated. Mild bibasilar atelectasis is noted. There is no evidence of pleural effusion or pneumothorax.  The cardiomediastinal silhouette is within normal limits. No acute osseous abnormalities are seen.  IMPRESSION: Mild bibasilar  atelectasis noted; lungs otherwise clear. No displaced rib fracture seen.   Electronically Signed   By: Garald Balding M.D.   On: 12/22/2014 17:17   Dg Ankle Right Port  12/22/2014   CLINICAL DATA:  Ankle fracture dislocation.  Postreduction images.  EXAM: PORTABLE RIGHT ANKLE - 2 VIEW  COMPARISON:  12/22/2014 at 1507 hours.  FINDINGS: Splint has been applied over the ankle. The ankle dislocation has been reduced. Ankle alignment is improved with persistent subluxation of the tibiotalar joint associated with a try malleolar fracture. Calcaneal spurs are incidentally noted.  IMPRESSION: Improved alignment of ankle fracture dislocation with application of splint.   Electronically Signed   By: Dereck Ligas M.D.   On: 12/22/2014 17:31   Dx#1 open fracture dislocation of right ankle #2 fall  Orlie Dakin, MD 12/22/14 1734

## 2014-12-22 NOTE — Interval H&P Note (Signed)
History and Physical Interval Note:  12/22/2014 6:19 PM  Sheila Shields  has presented today for surgery, with the diagnosis of RIGHT ANKLE FRACTURE  The various methods of treatment have been discussed with the patient and family. After consideration of risks, benefits and other options for treatment, the patient has consented to  Procedure(s): OPEN REDUCTION INTERNAL FIXATION (ORIF) ANKLE FRACTURE (Right) as a surgical intervention .  The patient's history has been reviewed, patient examined, no change in status, stable for surgery.  I have reviewed the patient's chart and labs.  Questions were answered to the patient's satisfaction.     [name] C

## 2014-12-23 ENCOUNTER — Encounter (HOSPITAL_COMMUNITY): Payer: Self-pay | Admitting: Specialist

## 2014-12-23 LAB — COMPREHENSIVE METABOLIC PANEL
ALT: 16 U/L (ref 14–54)
AST: 21 U/L (ref 15–41)
Albumin: 3.2 g/dL — ABNORMAL LOW (ref 3.5–5.0)
Alkaline Phosphatase: 50 U/L (ref 38–126)
Anion gap: 9 (ref 5–15)
BUN: 15 mg/dL (ref 6–20)
CO2: 29 mmol/L (ref 22–32)
CREATININE: 0.76 mg/dL (ref 0.44–1.00)
Calcium: 9.2 mg/dL (ref 8.9–10.3)
Chloride: 102 mmol/L (ref 101–111)
GFR calc Af Amer: >60 mL/min (ref >60)
Glucose, Bld: 110 mg/dL — ABNORMAL HIGH (ref 65–99)
Potassium: 3.9 mmol/L (ref 3.5–5.1)
Sodium: 140 mmol/L (ref 135–145)
Total Bilirubin: 0.7 mg/dL (ref 0.3–1.2)
Total Protein: 5.3 g/dL — ABNORMAL LOW (ref 6.5–8.1)

## 2014-12-23 MED ORDER — RISAQUAD PO CAPS
1.0000 | ORAL_CAPSULE | Freq: Every day | ORAL | Status: DC
  Administered 2014-12-23 – 2014-12-25 (×3): 1 via ORAL
  Filled 2014-12-23 (×4): qty 1

## 2014-12-23 MED ORDER — ALUM & MAG HYDROXIDE-SIMETH 200-200-20 MG/5ML PO SUSP
30.0000 mL | ORAL | Status: DC | PRN
  Administered 2014-12-23: 30 mL via ORAL
  Filled 2014-12-23: qty 30

## 2014-12-23 NOTE — Evaluation (Addendum)
Occupational Therapy Evaluation Patient Details Name: MAHNOOR MATHISEN MRN: [id number] DOB: 1934/10/08 Today's Date: 12/23/2014    History of Present Illness 80 yo female s/p R ankle ORIF 12/22/14.    Clinical Impression   Pt limited by nausea and 5/10 pain this session. She did transfer to EOB and then was able to stand and take a few side/shuffles steps with the walker but fatigues easily. Will follow to progress ADL independence and safety for d/c home.    Follow Up Recommendations  Home health OT;Supervision/Assistance - 24 hour    Equipment Recommendations  3 in 1 bedside comode (versus a drop arm BSC)    Recommendations for Other Services       Precautions / Restrictions Precautions Precautions: Fall Required Braces or Orthoses: Other Brace/Splint Other Brace/Splint: short cast R LE Restrictions Weight Bearing Restrictions: Yes RLE Weight Bearing: Non weight bearing      Mobility Bed Mobility Overal bed mobility: Needs Assistance Bed Mobility: Supine to Sit;Sit to Supine     Supine to sit: Min assist Sit to supine: Min assist   General bed mobility comments: Assist for R LE. VCS safety, technique.   Transfers Overall transfer level: Needs assistance Equipment used: Rolling walker (2 wheeled) Transfers: Sit to/from Stand Sit to Stand: From elevated surface;Min assist;+2 physical assistance;+2 safety/equipment         General transfer comment: assist to rise, stabilize, control descent.     Balance Overall balance assessment: Needs assistance;History of Falls         Standing balance support: Bilateral upper extremity supported;During functional activity Standing balance-Leahy Scale: Poor Standing balance comment: needs RW                            ADL Overall ADL's : Needs assistance/impaired Eating/Feeding: Independent;Bed level   Grooming: Wash/dry face;Set up;Bed level   Upper Body Bathing: Supervision/ safety;Set up;Sitting    Lower Body Bathing: +2 for physical assistance;Maximal assistance;Sit to/from stand   Upper Body Dressing : Minimal assistance;Sitting   Lower Body Dressing: +2 for physical assistance;Sit to/from stand;Total assistance   Toilet Transfer: +2 for physical assistance;Moderate assistance;RW Toilet Transfer Details (indicate cue type and reason): Pt stood and initially took a couple "hop" steps but with fatigue, then did a few side scoot/shuffle steps with walker. Pt fatigues easily with UE weightbearing on walker. More assist needed to maintain upright posture/balance as she fatigues.  Toileting- Clothing Manipulation and Hygiene: +2 for physical assistance;Total assistance;Sit to/from stand         General ADL Comments: Pt not able to let go of the walker currently to be able to use UEs functionally in ADL to manage clothing, hygiene, etc. Pt limited by pain and nausa this session. Pt did vomit once back in bed after standing with walker and taking some side scoot/slide steps.      Vision     Perception     Praxis      Pertinent Vitals/Pain Pain Assessment: 0-10 Pain Score: 5  Pain Location: R LE Pain Descriptors / Indicators: Aching;Sore Pain Intervention(s): Repositioned;Monitored during session (R LE elevated)     Hand Dominance     Extremity/Trunk Assessment Upper Extremity Assessment Upper Extremity Assessment: Overall WFL for tasks assessed      Cervical / Trunk Assessment Cervical / Trunk Assessment: Normal   Communication Communication Communication: No difficulties   Cognition Arousal/Alertness: Awake/alert Behavior During Therapy: WFL for tasks assessed/performed Overall Cognitive  Status: Within Functional Limits for tasks assessed                     General Comments       Exercises       Shoulder Instructions      Home Living Family/patient expects to be discharged to:: Private residence Living Arrangements: Spouse/significant  other Available Help at Discharge: Family Type of Home: House Home Access: Level entry     [place]: One level     Bathroom Shower/Tub: Tub/shower unit;Walk-in shower   Bathroom Toilet: Handicapped height     Home Equipment: [place] - single point          Prior Functioning/Environment Level of Independence: Independent             OT Diagnosis: Generalized weakness;Acute pain   OT Problem List: Decreased strength;Decreased knowledge of use of DME or AE;Pain;Decreased activity tolerance   OT Treatment/Interventions: Self-care/ADL training;Patient/family education;Therapeutic activities;DME and/or AE instruction    OT Goals(Current goals can be found in the care plan section) Acute Rehab OT Goals Patient Stated Goal: regain independence.  OT Goal Formulation: With patient Time For Goal Achievement: 12/30/14 Potential to Achieve Goals: Good ADL Goals Pt Will Perform Lower Body Dressing: with min assist;sit to/from stand;sitting/lateral leans;with adaptive equipment Pt Will Transfer to Toilet: with min assist;stand pivot transfer;bedside commode (versus lateral scoot) Pt Will Perform Toileting - Clothing Manipulation and hygiene: with min assist;sit to/from stand;sitting/lateral leans Additional ADL Goal #1: Pt will perform supine to sit with supervision in prep for ADL.  OT Frequency: Min 2X/week   Barriers to D/C:            Co-evaluation              End of Session Equipment Utilized During Treatment: Gait belt;Rolling walker  Activity Tolerance: Patient limited by fatigue;Other (comment);Patient limited by pain (nausea) Patient left: in bed;with call bell/phone within reach   Time: 1036-1107 OT Time Calculation (min): 31 min Charges:  OT General Charges $OT Visit: 1 Procedure OT Evaluation $Initial OT Evaluation Tier I: 1 Procedure G-Codes:    Jules Schick  [phone] 12/23/2014, 12:48 PM

## 2014-12-23 NOTE — Evaluation (Signed)
Physical Therapy Evaluation Patient Details Name: Sheila Shields MRN: [id number] DOB: 14-Mar-1935 Today's Date: 12/23/2014   History of Present Illness  80 yo female s/p R ankle ORIF 12/22/14.   Clinical Impression  On eval, pt was Min-Mod assist +2 for mobility-able to stand and take a few small hop steps/L foot shuffle/slide towards HOB with use of RW. Mobility limited by N/V, weakness, and fatigue. Will plan to practice scoot transfer (bed<>chair/WC) on next session. Recommend HHPT and 24 hour supervision.     Follow Up Recommendations Home health PT;Supervision/Assistance - 24 hour    Equipment Recommendations  Rolling walker with 5" wheels;Wheelchair ;Wheelchair cushion Manufacturing engineer, if possible, with Marketing executive)    Recommendations for Other Services       Precautions / Restrictions Precautions Precautions: Fall Required Braces or Orthoses: Other Brace/Splint Other Brace/Splint: short cast R LE Restrictions Weight Bearing Restrictions: Yes RLE Weight Bearing: Non weight bearing      Mobility  Bed Mobility Overal bed mobility: Needs Assistance Bed Mobility: Supine to Sit;Sit to Supine     Supine to sit: Min assist Sit to supine: Min assist   General bed mobility comments: Assist for R LE. VCS safety, technique.   Transfers Overall transfer level: Needs assistance Equipment used: Rolling walker (2 wheeled) Transfers: Sit to/from Stand Sit to Stand: From elevated surface;Min assist;+2 physical assistance;+2 safety/equipment         General transfer comment: assist to rise, stabilize, control descent.   Ambulation/Gait Ambulation/Gait assistance: Mod assist;+2 physical assistance;+2 safety/equipment           General Gait Details: Assist to stabilize pt, maneuver with RW, and support R LE. VCS safety, technqiue. Pt able to alternate between very small hop steps and L foot slide/shuffle to move towards HOB.   Stairs            Wheelchair  Mobility    Modified Rankin (Stroke Patients Only)       Balance Overall balance assessment: Needs assistance;History of Falls         Standing balance support: Bilateral upper extremity supported;During functional activity Standing balance-Leahy Scale: Poor Standing balance comment: needs RW                             Pertinent Vitals/Pain Pain Assessment: 0-10 Pain Score: 5  Pain Location: R LE Pain Descriptors / Indicators: Aching;Sore Pain Intervention(s): Repositioned;Monitored during session (R LE elevated)    Home Living Family/patient expects to be discharged to:: Private residence Living Arrangements: Spouse/significant other Available Help at Discharge: Family Type of Home: House Home Access: Level entry     Home Layout: One level Home Equipment: [place] - single point      Prior Function Level of Independence: Independent               Hand Dominance        Extremity/Trunk Assessment   Upper Extremity Assessment: Defer to OT evaluation           Lower Extremity Assessment: RLE deficits/detail RLE Deficits / Details: hip flex 3-/5, moves toes well    Cervical / Trunk Assessment: Normal  Communication   Communication: No difficulties  Cognition Arousal/Alertness: Awake/alert Behavior During Therapy: WFL for tasks assessed/performed Overall Cognitive Status: Within Functional Limits for tasks assessed                      General Comments  Exercises        Assessment/Plan    PT Assessment Patient needs continued PT services  PT Diagnosis Difficulty walking;Abnormality of gait;Generalized weakness;Acute pain   PT Problem List Decreased strength;Decreased range of motion;Decreased activity tolerance;Decreased balance;Decreased mobility;Decreased knowledge of use of DME;Pain;Decreased knowledge of precautions  PT Treatment Interventions DME instruction;Gait training;Functional mobility training;Therapeutic  activities;Therapeutic exercise;Wheelchair mobility training;Patient/family education;Balance training   PT Goals (Current goals can be found in the Care Plan section) Acute Rehab PT Goals Patient Stated Goal: regain independence.  PT Goal Formulation: With patient Time For Goal Achievement: 01/06/15 Potential to Achieve Goals: Good    Frequency Min 4X/week   Barriers to discharge        Co-evaluation               End of Session Equipment Utilized During Treatment: Gait belt Activity Tolerance: Patient limited by fatigue;Patient limited by pain (Limited by N/V) Patient left: in bed;with call bell/phone within reach           Time: 1036-1106 PT Time Calculation (min) (ACUTE ONLY): 30 min   Charges:   PT Evaluation $Initial PT Evaluation Tier I: 1 Procedure     PT G Codes:        Weston Anna, MPT Pager: [phone]

## 2014-12-23 NOTE — Op Note (Signed)
Sheila Shields, BALLY                 ACCOUNT NO.:  [id number]  MEDICAL RECORD NO.:  [id number]  LOCATION:  6333                         FACILITY:  [facility]  PHYSICIAN:  Susa Day, M.D.    DATE OF BIRTH:  08/26/1934  DATE OF PROCEDURE:  12/22/2014 DATE OF DISCHARGE:                              OPERATIVE REPORT   PREOPERATIVE DIAGNOSIS:  Open bimalleolar ankle fracture.  POSTOPERATIVE DIAGNOSIS:  Open bimalleolar ankle fracture.  PROCEDURE PERFORMED: 1. Irrigation and debridement of open fracture. 2. Open reduction internal fixation, bimalleolar ankle fracture     utilizing an anatomic fibular plate, Biomet. 3. Open reduction internal fixation of the medial malleolus with a     small avulsion fracture and repair of the deltoid ligament with 2     JuggerKnots. 4. Application of short leg cast. 5. Bone grafting of the segmental defect of the fibula with DBM     StaGraft. 6. Repair of complex laceration, 10 cm in length.  ANESTHESIA:  General.  SURGEON:  Susa Day, M.D.  ASSISTANT:  Cleophas Dunker, P.A.  HISTORY:  A 80 year old with fracture dislocation of the ankle, open with medial laceration, extending anterior to posterior; complete avulsion of the deltoid with a small portion of the medial malleolus; open ankle fracture.  It was a clean wound.  She was coming down the stairs, everted her ankle.  She was given antibiotics in the emergency room, close reduced and splinted, indicated for open reduction internal fixation.  She had a comminuted segmental fibula, fracture dislocation. The syndesmosis appeared to be intact.  She was indicated for irrigation debridement, fixation.  Risks and benefits were discussed including bleeding, infection, DVT, PE, need for revision, stiff ankle, also possible amputation in the future.  TECHNIQUE:  With the patient in supine position, after induction of adequate general anesthesia, 2 g of Kefzol, the right lower extremity was  prepped and draped in usual sterile fashion.  The ankle joint was dislocated through the open medial wounds about 10 cm in length.  We did slightly extended 1 cm on each anteriorly and posteriorly and distally. We exposed the entire portion of the medial malleolus.  It was essentially bone, it was not dirty.  We debrided it with a Beyer rongeur distally, debrided the tissue.  We used pulsatile lavage 6 L in the joint at the end of the medial malleolus.  It was very clean following this, good bleeding tissue.  We then provisionally reduced the ankle joint.  We turned our attention laterally, made an incision over the fibula.  We dissected down to the fibula.  There was significant disruption of the subcutaneous tissue.  Down to the fibula, segmental fractures of the fibula at the metaphyseal-diaphyseal junction proximal. There was no anatomic option to put an inter-fragmentary screw.  I felt this would require an anatomic Biomet plate.  We selected the 6.0 anatomic plate, reduced the fibula, held it with a tenaculum, placed 4 bicortical screws proximally after drilling, depth gauging, insertion of the screws with excellent purchase.  This required some reconfiguration due to the spiral nature of the fibula.  It was difficult to reduce the plate to the fibula.  Care was taken not to incarcerate the superficial branch of the peroneal nerve.  This cupped the distal end of the fibula. This was secured distally with 4 locking screws after drilling, measurement, and insertion of the appropriate locking screws and torquing with excellent rigidity noted of the construct, bicortical screws proximally and the locking screws distally.  This was irrigated. We had redraped.  We put StaGraft in the area where there were some segmental defects of the fibula.  This was StaGraft DBM Plus, approximately 5 mL of that.  This filled the gap nicely.  Then, under x- ray, the mortise was actually, the syndesmosis  was intact.  I examined that under anesthesia and felt it was intact as well and did not require a syndesmotic screw.  Medially, I felt that a complete avulsion of the medial malleolus will require anchors.  We used JuggerKnot anchors, placed two of them in the distal fibula and then stitched the avulsion, and a portion of the deltoid brought it up to the medial malleolus, held it in slight supination and then closed the gap.  We had good resistance to pull out of the JuggerKnots and a good surgical knot applied, over sewed with 2-0, did leave a small aperture anteriorly and posteriorly, so that the joint would be open and drain if needed.  Following this, I copiously irrigated both wounds again.  Stress radiographs showed a stable ankle mortise in the AP and lateral plane.  Subcu with 2-0 sutures laterally and skin with staples medially were placed.  We reapproximated with some subcu 2-0, loosely reapproximated with staples with small apertures to allow for draining when needed.  Excellent reapproximation was noted.  Adaptic was placed.  Sterile dressing was applied.  Short leg cast, well mold, slight supination was performed.  The patient tolerated the procedure well.  No complications.  Blood loss 50 mL.  She was extubated and transported to the recovery room in satisfactory condition.     Susa Day, M.D.     Geralynn Rile  D:  12/22/2014  T:  12/23/2014  Job:  [id number]

## 2014-12-23 NOTE — Progress Notes (Signed)
Subjective: 1 Day Post-Op Procedure(s) (LRB): OPEN REDUCTION INTERNAL FIXATION (ORIF) ANKLE FRACTURE (Right) Patient reports pain as moderate.  Denies numbness or tingling. Doing well with PT this AM. Nauseous this morning. Husband at bedside. Seen by myself and Dr. Tonita Cong  Objective: Vital signs in last 24 hours: Temp:  [96.9 F (36.1 C)-99.7 F (37.6 C)] 97.3 F (36.3 C) (06/15 1028) Pulse Rate:  [76-114] 88 (06/15 1028) Resp:  [10-23] 16 (06/15 1028) BP: (114-176)/(46-100) 114/46 mmHg (06/15 1028) SpO2:  [93 %-100 %] 96 % (06/15 1028) Weight:  [95.5 kg (210 lb 8.6 oz)] 95.5 kg (210 lb 8.6 oz) (06/14 2200)  Intake/Output from previous day: 06/14 0701 - 06/15 0700 In: 1455 [I.V.:1300; IV Piggyback:155] Out: 100 [Blood:100] Intake/Output this shift: Total I/O In: 480 [P.O.:480] Out: 1000 [Urine:500; Emesis/NG output:500]   Recent Labs  12/22/14 1454 12/22/14 1458  HGB 13.9 14.6    Recent Labs  12/22/14 1454 12/22/14 1458  WBC 11.2*  --   RBC 4.55  --   HCT 41.0 43.0  PLT 235  --     Recent Labs  12/22/14 1458 12/23/14 0440  NA 141 140  K 3.6 3.9  CL 105 102  CO2  --  29  BUN 17 15  CREATININE 0.80 0.76  GLUCOSE 128* 110*  CALCIUM  --  9.2   No results for input(s): LABPT, INR in the last 72 hours.  Neurologically intact ABD soft Neurovascular intact Sensation intact distally Intact pulses distally Dorsiflexion/Plantar flexion intact Incision: dressing C/D/I and no drainage No cellulitis present Compartment soft no sign of DVT  Assessment/Plan: 1 Day Post-Op Procedure(s) (LRB): OPEN REDUCTION INTERNAL FIXATION (ORIF) ANKLE FRACTURE (Right) Advance diet Up with therapy IV Ancef x 48 hours due to open fx Will add acidophilus, has been on cleocin for a week for dental procedure prior to this injury Ice and elevation for swelling Bivalve cast this AM, wrap in coban Remain strict NWB RLE  Sheila Shields M. 12/23/2014, 11:00 AM

## 2014-12-23 NOTE — Care Management Note (Signed)
Case Management Note  Patient Details  Name: Sheila Shields MRN: [id number] Date of Birth: 03-Oct-1934  Subjective/Objective:                   1 Day Post-Op Procedure(s) (LRB): OPEN REDUCTION INTERNAL FIXATION (ORIF) ANKLE FRACTURE (Right) Action/Plan: Discharge planning  Expected Discharge Date:   (unknown)               Expected Discharge Plan:  [place]  In-House Referral:     Discharge planning Services  CM Consult  Post Acute Care Choice:  Home Health Choice offered to:  Patient  DME Arranged:  Walker rolling, Youth worker wheelchair with seat cushion DME Agency:  [place]:  PT [place] Agency:  [place]  Status of Service:  Completed, signed off  Medicare Important Message Given:    Date Medicare IM Given:    Medicare IM give by:    Date Additional Medicare IM Given:    Additional Medicare Important Message give by:     If discussed at [place] of Stay Meetings, dates discussed:    Additional Comments: CM met with pt in room to offer choice of home health agency.  Pt chooses [facility] to render HHPT.  Address and contact information verified by pt.  Referral called to [facility] rep, Kristen.  CM called [facility] DME rep, Lecretia to please deliver the rolling walker and wheelchair to room prior to discharge. Dellie Catholic, RN 12/23/2014, 3:28 PM

## 2014-12-24 LAB — COMPREHENSIVE METABOLIC PANEL
ALBUMIN: 3.2 g/dL — AB (ref 3.5–5.0)
ALT: 13 U/L — AB (ref 14–54)
AST: 32 U/L (ref 15–41)
Alkaline Phosphatase: 52 U/L (ref 38–126)
Anion gap: 9 (ref 5–15)
BUN: 13 mg/dL (ref 6–20)
CO2: 31 mmol/L (ref 22–32)
Calcium: 8.8 mg/dL — ABNORMAL LOW (ref 8.9–10.3)
Chloride: 98 mmol/L — ABNORMAL LOW (ref 101–111)
Creatinine, Ser: 0.83 mg/dL (ref 0.44–1.00)
GFR calc Af Amer: >60 mL/min (ref >60)
GFR calc non Af Amer: >60 mL/min (ref >60)
Glucose, Bld: 127 mg/dL — ABNORMAL HIGH (ref 65–99)
POTASSIUM: 3.9 mmol/L (ref 3.5–5.1)
SODIUM: 138 mmol/L (ref 135–145)
Total Bilirubin: 0.7 mg/dL (ref 0.3–1.2)
Total Protein: 5.9 g/dL — ABNORMAL LOW (ref 6.5–8.1)

## 2014-12-24 MED ORDER — ASPIRIN EC 81 MG PO TBEC: 325.0000 mg | DELAYED_RELEASE_TABLET | Freq: Two times a day (BID) | ORAL | Status: DC

## 2014-12-24 NOTE — Progress Notes (Signed)
CM notified by OT pt no longer needs RW as she is borrowing from neighbor.  CM called [facility] DME rep, Lecretia to please pick up rolling walker from room prior to discharge.  Also, OT added on to HHPT and CM notified [facility] of add on for HHPT/OT.  NO other CM needs were communicated.

## 2014-12-24 NOTE — Progress Notes (Signed)
Physical Therapy Treatment Patient Details Name: Sheila Shields MRN: [id number] DOB: 05-16-1935 Today's Date: 12/24/2014    History of Present Illness 80 yo female s/p R ankle ORIF 12/22/14.     PT Comments    Progressing with mobility. Able to pivot/shuffle to recliner with RW on today. Pt showed improvement with maintaining NWB status as well. Not yet able to take hopping steps. Will continue to follow and progress activity as able.   Follow Up Recommendations  Home health PT;Supervision/Assistance - 24 hour     Equipment Recommendations  Wheelchair (measurements PT);Wheelchair cushion (measurements PT) (R elevating legrest)    Recommendations for Other Services       Precautions / Restrictions Precautions Precautions: Fall Required Braces or Orthoses: Other Brace/Splint Other Brace/Splint: short cast R LE Restrictions Weight Bearing Restrictions: Yes RLE Weight Bearing: Non weight bearing    Mobility  Bed Mobility Overal bed mobility: Needs Assistance Bed Mobility: Supine to Sit     Supine to sit: Supervision        Transfers Overall transfer level: Needs assistance Equipment used: Rolling walker (2 wheeled) Transfers: Sit to/from [facility] Sit to Stand: Min assist;From elevated surface Stand pivot transfers: Min assist       General transfer comment: assist to rise, stabilize, control descent. Stand pivot/shuffle with RW. Recliner brought up behind pt to sit. Not yet able to take hopping steps.   Ambulation/Gait                 Stairs            Wheelchair Mobility    Modified Rankin (Stroke Patients Only)       Balance           Standing balance support: Bilateral upper extremity supported;During functional activity Standing balance-Leahy Scale: Poor Standing balance comment: needs RW                    Cognition Arousal/Alertness: Awake/alert Behavior During Therapy: WFL for tasks  assessed/performed Overall Cognitive Status: Within Functional Limits for tasks assessed                      Exercises      General Comments        Pertinent Vitals/Pain Pain Assessment: Faces Faces Pain Scale: Hurts a little bit Pain Location: R LE Pain Descriptors / Indicators: Sore Pain Intervention(s): Monitored during session;Limited activity within patient's tolerance;Repositioned;Ice applied (elevated)    Home Living                      Prior Function            PT Goals (current goals can now be found in the care plan section) Progress towards PT goals: Progressing toward goals    Frequency  Min 4X/week    PT Plan Current plan remains appropriate    Co-evaluation             End of Session Equipment Utilized During Treatment: Gait belt Activity Tolerance: Patient tolerated treatment well Patient left: in chair;with call bell/phone within reach;with family/visitor present     Time: 6270-3500 PT Time Calculation (min) (ACUTE ONLY): 28 min  Charges:  $Therapeutic Activity: 8-22 mins                    G Codes:      Weston Anna, MPT Pager: [phone]

## 2014-12-24 NOTE — Progress Notes (Signed)
Occupational Therapy Treatment Patient Details Name: LEANAH KOLANDER MRN: [id number] DOB: 1935-06-22 Today's Date: 12/24/2014    History of present illness 80 yo female s/p R ankle ORIF 12/22/14.    OT comments  Pt making good progress. Doing better with NWB compliance but still needs continued practice. Will follow to progress ADL independence for d/c.    Follow Up Recommendations  Home health OT;Supervision/Assistance - 24 hour    Equipment Recommendations  None recommended by OT (pt can borrow one)    Recommendations for Other Services      Precautions / Restrictions Precautions Precautions: Fall Required Braces or Orthoses: Other Brace/Splint Other Brace/Splint: short cast R LE Restrictions Weight Bearing Restrictions: Yes RLE Weight Bearing: Non weight bearing       Mobility Bed Mobility Overal bed mobility: Needs Assistance Bed Mobility: Supine to Sit     Supine to sit: Supervision        Transfers Overall transfer level: Needs assistance Equipment used: Rolling walker (2 wheeled) Transfers: Sit to/from [facility] Sit to Stand: Min assist;From elevated surface Stand pivot transfers: Min assist       General transfer comment: assist to rise, stabilize, control descent. Stand pivot/shuffle with RW. Recliner brought up behind pt to sit. Not yet able to take hopping steps.     Balance           Standing balance support: Bilateral upper extremity supported;During functional activity Standing balance-Leahy Scale: Poor Standing balance comment: needs RW                   ADL                           Toilet Transfer: Minimal assistance;Stand-pivot;RW             General ADL Comments: Discussed LB dressing and options for leaning side to side versus in standing. Pt states she will likely just wear a lot of house dresses at first. Pt states she can borrow a 3in1. Also discussed options for the shower including the  tub transfer bench and let it over hand the shower stall so she doesnt have to negotiate getting over the shower ledge. She states she will let HHOT address shower with her and DME needs but verbalizes understanding of tub bench option. Pt did better with NWB today but did have some initial weight on R LE with sit to stand. Not yet taking hopping steps but did pivot around and chair brought up.       Vision                     Perception     Praxis      Cognition   Behavior During Therapy: WFL for tasks assessed/performed Overall Cognitive Status: Within Functional Limits for tasks assessed                       Extremity/Trunk Assessment               Exercises     Shoulder Instructions       General Comments      Pertinent Vitals/ Pain       Pain Assessment: Faces Faces Pain Scale: Hurts a little bit Pain Location: R LE Pain Descriptors / Indicators: Sore Pain Intervention(s): Monitored during session;Limited activity within patient's tolerance;Repositioned;Ice applied (elevated)  Home Living  Prior Functioning/Environment              Frequency Min 2X/week     Progress Toward Goals  OT Goals(current goals can now be found in the care plan section)  Progress towards OT goals: Progressing toward goals     Plan Discharge plan remains appropriate    Co-evaluation                 End of Session Equipment Utilized During Treatment: Gait belt;Rolling walker   Activity Tolerance Patient tolerated treatment well   Patient Left in chair;with call bell/phone within reach   Nurse Communication          Time: 2620-3559 OT Time Calculation (min): 28 min  Charges: OT General Charges $OT Visit: 1 Procedure OT Treatments $Therapeutic Activity: 8-22 mins  Jules Schick  [phone] 12/24/2014, 1:21 PM

## 2014-12-24 NOTE — Progress Notes (Signed)
Subjective: 2 Days Post-Op Procedure(s) (LRB): OPEN REDUCTION INTERNAL FIXATION (ORIF) ANKLE FRACTURE (Right) Patient reports pain as mild.  Pain well controlled. Reports chronic cough since January- no changes since surgery/injury. On O2. No CP.  Objective: Vital signs in last 24 hours: Temp:  [97.3 F (36.3 C)-99.3 F (37.4 C)] 97.9 F (36.6 C) (06/16 0503) Pulse Rate:  [84-108] 84 (06/16 0503) Resp:  [12-16] 16 (06/16 0503) BP: (114-144)/(46-82) 129/55 mmHg (06/16 0503) SpO2:  [41 %-100 %] 100 % (06/16 0503)  Intake/Output from previous day: 06/15 0701 - 06/16 0700 In: 2835.3 [P.O.:1200; I.V.:1435.3; IV Piggyback:200] Out: 2200 [Urine:1700; Emesis/NG output:500] Intake/Output this shift:     Recent Labs  12/22/14 1454 12/22/14 1458  HGB 13.9 14.6    Recent Labs  12/22/14 1454 12/22/14 1458  WBC 11.2*  --   RBC 4.55  --   HCT 41.0 43.0  PLT 235  --     Recent Labs  12/23/14 0440 12/24/14 0442  NA 140 138  K 3.9 3.9  CL 102 98*  CO2 29 31  BUN 15 13  CREATININE 0.76 0.83  GLUCOSE 110* 127*  CALCIUM 9.2 8.8*   No results for input(s): LABPT, INR in the last 72 hours.  Neurologically intact ABD soft Neurovascular intact Sensation intact distally Intact pulses distally Dorsiflexion/Plantar flexion intact Incision: dressing C/D/I and no drainage No cellulitis present Compartment soft no sign of DVT  Assessment/Plan: 2 Days Post-Op Procedure(s) (LRB): OPEN REDUCTION INTERNAL FIXATION (ORIF) ANKLE FRACTURE (Right) Advance diet Up with therapy D/C IV fluids  Continue IV abx x 48 hours post-op, late tonight Plan D/C home tomorrow as long as pain remains well controlled Ice, elevation Discussed ASA for DVT ppx at home  Blaine, JACLYN M. 12/24/2014, 9:28 AM

## 2014-12-25 ENCOUNTER — Encounter (HOSPITAL_COMMUNITY): Payer: Self-pay | Admitting: Specialist

## 2014-12-25 LAB — COMPREHENSIVE METABOLIC PANEL
ALT: 10 U/L — ABNORMAL LOW (ref 14–54)
AST: 32 U/L (ref 15–41)
Albumin: 3 g/dL — ABNORMAL LOW (ref 3.5–5.0)
Alkaline Phosphatase: 47 U/L (ref 38–126)
Anion gap: 7 (ref 5–15)
BUN: 16 mg/dL (ref 6–20)
CALCIUM: 8.7 mg/dL — AB (ref 8.9–10.3)
CO2: 32 mmol/L (ref 22–32)
CREATININE: 0.68 mg/dL (ref 0.44–1.00)
Chloride: 98 mmol/L — ABNORMAL LOW (ref 101–111)
GFR calc Af Amer: >60 mL/min (ref >60)
Glucose, Bld: 98 mg/dL (ref 65–99)
Potassium: 3.7 mmol/L (ref 3.5–5.1)
Sodium: 137 mmol/L (ref 135–145)
Total Bilirubin: 0.7 mg/dL (ref 0.3–1.2)
Total Protein: 5.6 g/dL — ABNORMAL LOW (ref 6.5–8.1)

## 2014-12-25 MED ORDER — TRAMADOL HCL 50 MG PO TABS
50.0000 mg | ORAL_TABLET | Freq: Four times a day (QID) | ORAL | Status: DC | PRN
  Administered 2014-12-25 – 2014-12-26 (×2): 50 mg via ORAL
  Filled 2014-12-25: qty 1
  Filled 2014-12-25: qty 2

## 2014-12-25 NOTE — Discharge Instructions (Signed)
Remain non weightbearing right leg at all times Ice and elevate right leg toes above the nose, 20-30 minutes at a time 5-6x/day to reduce swelling Keep cast clean and dry at all times Take aspirin '325mg'$  twice per day to reduce risk of blood clots Follow up with Dr. Tonita Cong in 1 week for wound check/cast change Take Calcium, vitamin D and vitamin C daily to promote better healing and bone health

## 2014-12-25 NOTE — Progress Notes (Signed)
Physical Therapy Treatment Patient Details Name: Sheila Shields MRN: [id number] DOB: 01/15/1935 Today's Date: 12/25/2014    History of Present Illness 80 yo female s/p R ankle ORIF 12/22/14.     PT Comments    Progressing well with mobility. Able to take a few small hop steps on today. Pt is planning d/c home on tomorrow. Daughter will be coming up to hospital and pt/family would like session with PT for practice/education prior to d/c. May need to review wheelchair bump technique as well-one small step to get through door. Pt asking about knee scooter-encouraged pt to discuss this with MD and, if it is approved, for her to practice with HHPT.   Follow Up Recommendations  Home health PT;Supervision/Assistance - 24 hour     Equipment Recommendations  Wheelchair ;Wheelchair cushion  Manufacturing engineer with R Agricultural engineer. Pt states she will borrow walker.)    Recommendations for Other Services       Precautions / Restrictions Precautions Precautions: Fall Required Braces or Orthoses: Other Brace/Splint Other Brace/Splint: short cast R LE Restrictions Weight Bearing Restrictions: Yes RLE Weight Bearing: Non weight bearing    Mobility  Bed Mobility Overal bed mobility: Needs Assistance Bed Mobility: Supine to Sit     Supine to sit: Supervision        Transfers Overall transfer level: Needs assistance Equipment used: Rolling walker (2 wheeled) Transfers: Sit to/from Stand Sit to Stand: Min assist;From elevated surface         General transfer comment: assist to rise, stabilize, control descent. Stand pivot from bed to recliner with RW. Pt did well with maintaining NWB.  Ambulation/Gait Ambulation/Gait assistance: Min assist Ambulation Distance (Feet): 3 Feet   Gait Pattern/deviations: Step-to pattern     General Gait Details: 4 small hop steps forward, stand pivot, then 4 small hop steps backward. VCs safety, technique. Arms are on weaker side so pt just barely  able to clear foot for hops. Pt did well maintaining NWB   Stairs            Wheelchair Mobility    Modified Rankin (Stroke Patients Only)       Balance                                    Cognition Arousal/Alertness: Awake/alert Behavior During Therapy: WFL for tasks assessed/performed Overall Cognitive Status: Within Functional Limits for tasks assessed                      Exercises General Exercises - Lower Extremity Quad Sets: AROM;5 reps;Supine Hip ABduction/ADduction: AROM;5 reps;Supine Straight Leg Raises: AROM;5 reps;Supine    General Comments        Pertinent Vitals/Pain Pain Assessment: 0-10 Pain Score: 2  Pain Location: R LE Pain Descriptors / Indicators: Sore Pain Intervention(s): Monitored during session;Ice applied;Repositioned (R LE elevated)    Home Living                      Prior Function            PT Goals (current goals can now be found in the care plan section) Progress towards PT goals: Progressing toward goals    Frequency  Min 4X/week    PT Plan Current plan remains appropriate    Co-evaluation             End of Session Equipment Utilized  During Treatment: Gait belt Activity Tolerance: Patient tolerated treatment well Patient left: in chair;with call bell/phone within reach;with family/visitor present     Time: 1000-1037 PT Time Calculation (min) (ACUTE ONLY): 37 min  Charges:  $Therapeutic Activity: 23-37 mins                    G Codes:      Weston Anna, MPT Pager: [phone]

## 2014-12-25 NOTE — Progress Notes (Signed)
Subjective: 3 Days Post-Op Procedure(s) (LRB): OPEN REDUCTION INTERNAL FIXATION (ORIF) ANKLE FRACTURE (Right) Patient reports pain as mild.  Pain well controlled. Off O2 this morning, O2 sat 98 on RA. No other c/o. Sleeping well. Denies numbness or tingling. Does not feel ready for D/C today because she is worried about her husband being her sole caregiver. Her daughter is flying in tomorrow AM and she is hoping to leave after she arrives to help with her care.  Objective: Vital signs in last 24 hours: Temp:  [98.3 F (36.8 C)-98.8 F (37.1 C)] 98.3 F (36.8 C) (06/17 0541) Pulse Rate:  [76-96] 76 (06/17 0541) Resp:  [14-18] 14 (06/17 0541) BP: (120-128)/(48-49) 128/48 mmHg (06/17 0541) SpO2:  [98 %-99 %] 98 % (06/17 0541)  Intake/Output from previous day: 06/16 0701 - 06/17 0700 In: 1270.8 [P.O.:840; I.V.:380.8; IV Piggyback:50] Out: -  Intake/Output this shift:     Recent Labs  12/22/14 1454 12/22/14 1458  HGB 13.9 14.6    Recent Labs  12/22/14 1454 12/22/14 1458  WBC 11.2*  --   RBC 4.55  --   HCT 41.0 43.0  PLT 235  --     Recent Labs  12/24/14 0442 12/25/14 0430  NA 138 137  K 3.9 3.7  CL 98* 98*  CO2 31 32  BUN 13 16  CREATININE 0.83 0.68  GLUCOSE 127* 98  CALCIUM 8.8* 8.7*   No results for input(s): LABPT, INR in the last 72 hours.  Neurologically intact ABD soft Neurovascular intact Sensation intact distally Intact pulses distally Dorsiflexion/Plantar flexion intact Incision: dressing C/D/I and no drainage No cellulitis present Compartment soft no sign of DVT  Assessment/Plan: 3 Days Post-Op Procedure(s) (LRB): OPEN REDUCTION INTERNAL FIXATION (ORIF) ANKLE FRACTURE (Right) Advance diet Up with therapy D/C IV fluids  Has completed 48 hrs of IV abx Continue Lovenox for DVT ppx while in house and upon D/C ASA '325mg'$  BID Continue ice, elevation, pain control Remain NWB RLE Plan D/C tomorrow, follow up in 1 week in office for wound  check/cast change Will discuss with Dr. Mliss Fritz, JACLYN M. 12/25/2014, 8:07 AM

## 2014-12-25 NOTE — Discharge Summary (Signed)
Patient ID: GENECIS VELEY MRN: [id number] DOB/AGE: 12/29/1934 80 y.o.  Admit date: 12/22/2014 Discharge date: 12/26/14 anticipated D/C  Admission Diagnoses:  Principal Problem:   Open right ankle fracture   Discharge Diagnoses:  Same  Past Medical History  Diagnosis Date  . HYPERLIPIDEMIA 12/17/2006  . ANXIETY 12/18/2006  . ACUTE ALCOHOLIC INTOXICATION IN REMISSION 08/27/2007  . DEPRESSION 12/18/2006  . HYPERTENSION 12/17/2006  . COPD 12/17/2006  . GERD 05/01/2007  . ARTHRITIS 02/23/2010  . HYPERGLYCEMIA 08/27/2007  . Mood disorder   . Arthritis   . Rectal polyp 03/27/2007    adenoma  . Diverticulosis 03/27/2007    Surgeries: Procedure(s): OPEN REDUCTION INTERNAL FIXATION (ORIF) ANKLE FRACTURE on 12/22/2014   Consultants: Treatment Team:  Susa Day, MD  Discharged Condition: Improved  Hospital Course: KINLEE GARRISON is an 80 y.o. female who was admitted 12/22/2014 for operative treatment ofOpen right ankle fracture. Patient was emergently taken to OR for the following  Procedure(s): OPEN REDUCTION INTERNAL FIXATION (ORIF) ANKLE FRACTURE, IRRIGATION AND DEBRIDEMENT WOUND  Patient was given perioperative antibiotics: Anti-infectives    Start     Dose/Rate Route Frequency Ordered Stop   12/23/14 0000  ceFAZolin (ANCEF) IVPB 2 g/50 mL premix     2 g 100 mL/hr over 30 Minutes Intravenous Every 6 hours 12/22/14 2150 12/24/14 1830   12/22/14 1852  polymyxin B 500,000 Units, bacitracin 50,000 Units in sodium chloride irrigation 0.9 % 500 mL irrigation  Status:  Discontinued       As needed 12/22/14 1852 12/22/14 2046   12/22/14 1700  ceFAZolin (ANCEF) IVPB 2 g/50 mL premix     2 g 100 mL/hr over 30 Minutes Intravenous On call to O.R. 12/22/14 1626 12/22/14 1835   12/22/14 1615  cefTRIAXone (ROCEPHIN) 1 g in dextrose 5 % 50 mL IVPB     1 g Intravenous  Once 12/22/14 1604 12/23/14 0119       Patient was given sequential compression devices, early ambulation, and chemoprophylaxis to  prevent DVT.  Patient benefited maximally from hospital stay and there were no complications.   She received IV abx x 48 hours post-op. She was placed on Lovenox for DVT ppx in hospital. She participated in PT, pain well controlled, remained NWB RLE as directed.  Recent vital signs: Patient Vitals for the past 24 hrs:  BP Temp Temp src Pulse Resp SpO2  12/25/14 0541 (!) 128/48 mmHg 98.3 F (36.8 C) Oral 76 14 98 %  12/24/14 2311 (!) 120/48 mmHg 98.6 F (37 C) Oral 96 16 -  12/24/14 1405 (!) 123/49 mmHg 98.8 F (37.1 C) - 77 18 99 %     Recent laboratory studies:  Recent Labs  12/22/14 1454 12/22/14 1458  12/24/14 0442 12/25/14 0430  WBC 11.2*  --   --   --   --   HGB 13.9 14.6  --   --   --   HCT 41.0 43.0  --   --   --   PLT 235  --   --   --   --   NA  --  141  < > 138 137  K  --  3.6  < > 3.9 3.7  CL  --  105  < > 98* 98*  CO2  --   --   < > 31 32  BUN  --  17  < > 13 16  CREATININE  --  0.80  < > 0.83 0.68  GLUCOSE  --  128*  < > 127* 98  CALCIUM  --   --   < > 8.8* 8.7*  < > = values in this interval not displayed.   Discharge Medications:     Medication List    STOP taking these medications        clindamycin 300 MG capsule  Commonly known as:  CLEOCIN      TAKE these medications        aspirin EC 81 MG tablet  Take 4 tablets (325 mg total) by mouth 2 (two) times daily.     CALTRATE 600+D 600-400 MG-UNIT per tablet  Generic drug:  Calcium Carbonate-Vitamin D  Take 1 tablet by mouth 2 (two) times daily.     docusate sodium 100 MG capsule  Commonly known as:  COLACE  Take 1 capsule (100 mg total) by mouth 2 (two) times daily.     ferrous sulfate 325 (65 FE) MG tablet  TAKE 1 TABLET BY MOUTH EVERY DAY     fish oil-omega-3 fatty acids 1000 MG capsule  Take 2 g by mouth daily.     fluticasone 50 MCG/ACT nasal spray  Commonly known as:  FLONASE  Place 2 sprays into both nostrils daily.     KLOR-CON M20 20 MEQ tablet  Generic drug:  potassium  chloride SA  TAKE 1 TABLET BY MOUTH EVERY DAY     lansoprazole 15 MG capsule  Commonly known as:  PREVACID  Take 1 capsule (15 mg total) by mouth daily.     loratadine 10 MG tablet  Commonly known as:  CLARITIN  Take 1 tablet (10 mg total) by mouth daily.     losartan-hydrochlorothiazide 100-12.5 MG per tablet  Commonly known as:  HYZAAR  TAKE 1 TABLET BY MOUTH DAILY.     meloxicam 7.5 MG tablet  Commonly known as:  MOBIC  Take 1 tablet (7.5 mg total) by mouth 2 (two) times daily.     multivitamin with minerals Tabs tablet  Take 1 tablet by mouth daily.     oxyCODONE-acetaminophen 5-325 MG per tablet  Commonly known as:  ROXICET  Take 1-2 tablets by mouth every 6 (six) hours as needed for severe pain.     PROAIR HFA 108 (90 BASE) MCG/ACT inhaler  Generic drug:  albuterol  INHALE 2 PUFFS EVERY 4 HOURS AS NEEDED FOR COUGHING SPELLS     simvastatin 20 MG tablet  Commonly known as:  ZOCOR  TAKE 1 TABLET (20 MG TOTAL) BY MOUTH EVERY MORNING.        Diagnostic Studies: Dg Ankle 2 Views Right  12-28-2014   CLINICAL DATA:  Status post open reduction internal fixation for fracture/dislocation  EXAM: RIGHT ANKLE - 2 VIEW  COMPARISON:  Study obtained earlier in the day.  FLUOROSCOPY TIME:  1 minutes 0 seconds.  Four images obtained.  FINDINGS: Frontal and lateral images obtained. There is screw and plate fixation through a comminuted fracture of the distal fibular diaphysis. There has been interval reduction of the mortise dislocation. An avulsion is noted in the medial malleolar region.  IMPRESSION: Open reduction internal fixation with fracture fragments in overall near anatomic alignment. Interval reduction of ankle mortise disruption.   Electronically Signed   By: Lowella Grip III M.D.   On: December 28, 2014 20:41   Dg Ankle 2 Views Right  2014/12/28   CLINICAL DATA:  Fall, ankle injury, open fracture with protruding bone at RIGHT ankle, slipped walking down stairs about 3 steps from  the bottom, initial encounter  EXAM: RIGHT ANKLE - 2 VIEW  COMPARISON:  None  FINDINGS: Bones appear demineralized.  Oblique comminuted fracture lateral malleolus displaced laterally and anteriorly.  Anterolateral tibiotalar dislocation.  Questionable fracture involving the articular margin of the distal tibia versus displaced fracture fragment from the medial malleolus or medial margin of talus.  No additional fractures identified.  External rotation of foot versus tibial shaft.  Achilles insertion calcaneal spur formation.  IMPRESSION: Anterolateral tibiotalar dislocation.  Comminuted displaced lateral malleolar fracture.  Additional fracture fragment adjacent to distal tibial articular surface though uncertain if this is arising from the tibial plafond, medial malleolus or medial margin of talus.   Electronically Signed   By: Lavonia Dana M.D.   On: 12/22/2014 15:23   Dg Chest Portable 1 View  12/22/2014   CLINICAL DATA:  Preoperative chest radiograph for left ankle surgery. Initial encounter.  EXAM: PORTABLE CHEST - 1 VIEW  COMPARISON:  Chest radiograph from 10/12/2014  FINDINGS: The lungs are well-aerated. Mild bibasilar atelectasis is noted. There is no evidence of pleural effusion or pneumothorax.  The cardiomediastinal silhouette is within normal limits. No acute osseous abnormalities are seen.  IMPRESSION: Mild bibasilar atelectasis noted; lungs otherwise clear. No displaced rib fracture seen.   Electronically Signed   By: Garald Balding M.D.   On: 12/22/2014 17:17   Dg Ankle Right Port  12/22/2014   CLINICAL DATA:  Ankle fracture dislocation.  Postreduction images.  EXAM: PORTABLE RIGHT ANKLE - 2 VIEW  COMPARISON:  12/22/2014 at 1507 hours.  FINDINGS: Splint has been applied over the ankle. The ankle dislocation has been reduced. Ankle alignment is improved with persistent subluxation of the tibiotalar joint associated with a try malleolar fracture. Calcaneal spurs are incidentally noted.  IMPRESSION:  Improved alignment of ankle fracture dislocation with application of splint.   Electronically Signed   By: Dereck Ligas M.D.   On: 12/22/2014 17:31   Dg C-arm Gt 120 Min-no Report  12/22/2014   CLINICAL DATA: surgery   C-ARM GT 120 MINUTE  Fluoroscopy was utilized by the requesting physician.  No radiographic  interpretation.     Disposition: 01-Home or Self Care        Follow-up Information    Follow up with BEANE,JEFFREY C, MD In 1 week.   Specialty:  Orthopedic Surgery   Why:  For wound re-check   Contact information:   [address] [place] [address] [phone]       Follow up with [facility].   Why:  home health physical and occupational therapy, wheelchair   Contact information:   [address] [place] [address] [phone]        Signed: Cecilie Kicks. PA-C for Dr. Tonita Cong 12/25/2014, 11:43 AM

## 2014-12-26 NOTE — Progress Notes (Signed)
Occupational Therapy Treatment Patient Details Name: Sheila Shields MRN: [id number] DOB: [birth year] Today's Date: 12/26/2014    History of present illness 80 yo female s/p R ankle ORIF 12/22/14.    OT comments  Pt is making good progress with OT.  Recommend continued OT at home to reinforce safety with transfers and work on safe standing for hygiene  Follow Up Recommendations  Home health OT;Supervision/Assistance - 24 hour    Equipment Recommendations  None recommended by OT (has 3:1)    Recommendations for Other Services      Precautions / Restrictions Precautions Precautions: Fall Precaution Comments: pt fatigues easily, encouraged w/c use as much as possible at home Required Braces or Orthoses: Other Brace/Splint Other Brace/Splint: short bi-valved cast R LE Restrictions Weight Bearing Restrictions: Yes RLE Weight Bearing: Non weight bearing       Mobility Bed Mobility              Transfers Overall transfer level: Needs assistance Equipment used: Rolling walker (2 wheeled) Transfers: Sit to/from Stand Sit to Stand: Min guard         General transfer comment: for safety.  Maintained NWB.  Educated family to stand on affected side    Balance Overall balance assessment: History of Falls         Standing balance support: Bilateral upper extremity supported;During functional activity Standing balance-Leahy Scale: Poor Standing balance comment: unable to maintain balance without UE support                   ADL                           Toilet Transfer: Min guard;Stand-pivot;RW;BSC             General ADL Comments: min cues to scoot forward in w/c before attempting to stand for transfer.  Pt tends to reach back with one hand; cued to use both.  Educated on leaning vs. standing with min A for safety for hygiene.  Educated on LE ADLs from long sitting vs having assistance.  Pt plans long dresses without underwear.  Husband and  daughter present and had no further questions for OT      Vision                     Perception     Praxis      Cognition   Behavior During Therapy: WFL for tasks assessed/performed Overall Cognitive Status: Within Functional Limits for tasks assessed                       Extremity/Trunk Assessment               Exercises     Shoulder Instructions       General Comments      Pertinent Vitals/ Pain       Pain Assessment: 0-10 Pain Score: 2  Pain Location:  RLE Pain Descriptors / Indicators: Aching Pain Intervention(s): Limited activity within patient's tolerance;Monitored during session;Premedicated before session;Repositioned  Home Living                                          Prior Functioning/Environment              Frequency Min 2X/week     Progress  Toward Goals  OT Goals(current goals can now be found in the care plan section)  Progress towards OT goals: Progressing toward goals  Acute Rehab OT Goals Patient Stated Goal: regain independence.   Plan      Co-evaluation                 End of Session     Activity Tolerance Patient tolerated treatment well   Patient Left with call bell/phone within reach;with family/visitor present (in w/c)   Nurse Communication  (ready for d/c)        Time: 3546-5681 OT Time Calculation (min): 16 min  Charges: OT General Charges $OT Visit: 1 Procedure OT Treatments $Self Care/Home Management : 8-22 mins  Avionna Bower 12/26/2014, 11:04 AM   Lesle Chris, OTR/L [phone] 12/26/2014

## 2014-12-26 NOTE — Progress Notes (Signed)
Physical Therapy Treatment Patient Details Name: Sheila Shields MRN: [id number] DOB: Jun 26, 1935 Today's Date: 12/26/2014    History of Present Illness 80 yo female s/p R ankle ORIF 12/22/14.     PT Comments    Pt will benefit from HHPT; Dtr inquiring about rehab and it appears she would have been a good candidate for SNF, however pt reports MD told her he wanted her to go home; there is certainly a concern for falls at home as pt is NWB and fatigues quickly; reviewed w/c techniques/safety, transfers and gait with pt, husband and dtr; encouraged pt to utilize w/c as much as possible and limit gait until she has more practice with HHPT;  Follow Up Recommendations  Home health PT;Supervision/Assistance - 24 hour     Equipment Recommendations  Other (comment) (has w/c, RW and 3in1)    Recommendations for Other Services       Precautions / Restrictions Precautions Precautions: Fall Precaution Comments: pt fatigues easily, encouraged w/c use as much as possible at home Required Braces or Orthoses: Other Brace/Splint Other Brace/Splint: short bi-valved cast R LE Restrictions Weight Bearing Restrictions: Yes RLE Weight Bearing: Non weight bearing    Mobility  Bed Mobility Overal bed mobility: Needs Assistance Bed Mobility: Supine to Sit     Supine to sit: Supervision     General bed mobility comments: for safety  Transfers Overall transfer level: Needs assistance Equipment used: Rolling walker (2 wheeled) Transfers: Sit to/from Stand Sit to Stand: Min assist         General transfer comment: assist to rise, stabilize, control descent. Stand pivot from bed to recliner with RW. Pt did well with maintaining NWB, cues for hand placement  Ambulation/Gait Ambulation/Gait assistance: Min assist Ambulation Distance (Feet): 9 Feet Assistive device: Rolling walker (2 wheeled)       General Gait Details: cues for RW safety, use of UEs, NWB; pt fatigues quickly   Stairs            Wheelchair Mobility    Modified Rankin (Stroke Patients Only)       Balance Overall balance assessment: History of Falls         Standing balance support: Bilateral upper extremity supported;During functional activity Standing balance-Leahy Scale: Poor Standing balance comment: unable to maintain balance without UE support                    Cognition Arousal/Alertness: Awake/alert Behavior During Therapy: WFL for tasks assessed/performed Overall Cognitive Status: Within Functional Limits for tasks assessed                      Exercises      General Comments        Pertinent Vitals/Pain Pain Assessment: 0-10 Pain Score: 2  Pain Location: RLE Pain Descriptors / Indicators: Aching Pain Intervention(s): Limited activity within patient's tolerance;Monitored during session;Premedicated before session    Home Living                      Prior Function            PT Goals (current goals can now be found in the care plan section) Acute Rehab PT Goals Patient Stated Goal: regain independence.  PT Goal Formulation: With patient Time For Goal Achievement: 01/06/15 Potential to Achieve Goals: Good Progress towards PT goals: Progressing toward goals    Frequency  Min 4X/week    PT Plan Current plan remains appropriate  Co-evaluation             End of Session Equipment Utilized During Treatment: Gait belt Activity Tolerance: Patient tolerated treatment well Patient left: in chair;with family/visitor present;Other (comment) (OT)     Time: 0301-4996 PT Time Calculation (min) (ACUTE ONLY): 31 min  Charges:  $Gait Training: 8-22 mins $Therapeutic Activity: 8-22 mins                    G Codes:      Pamela Maddy December 27, 2014, 10:58 AM

## 2014-12-26 NOTE — Progress Notes (Signed)
Pt given discharge instructions.  Assessment of patient unchanged and no distress. Transport via wheelchair. Lavell Anchors RN

## 2014-12-26 NOTE — Progress Notes (Signed)
   Subjective: 4 Days Post-Op Procedure(s) (LRB): OPEN REDUCTION INTERNAL FIXATION (ORIF) ANKLE FRACTURE (Right)  Pt doing well Minimal pain to right ankle  Ready for d/c home Patient reports pain as mild.  Objective:   VITALS:   Filed Vitals:   12/26/14 0650  BP:   Pulse: 79  Temp: 97.9 F (36.6 C)  Resp: 16    Right ankle in splint currently nv intact distally No rashes or edema  LABS No results for input(s): HGB, HCT, WBC, PLT in the last 72 hours.   Recent Labs  12/24/14 0442 12/25/14 0430  NA 138 137  K 3.9 3.7  BUN 13 16  CREATININE 0.83 0.68  GLUCOSE 127* 98     Assessment/Plan: 4 Days Post-Op Procedure(s) (LRB): OPEN REDUCTION INTERNAL FIXATION (ORIF) ANKLE FRACTURE (Right) D/c home today F/u in 2 weeks with Dr. Tonita Cong Pain control as needed    Merla Riches, [place], PA-C  12/26/2014, 8:10 AM

## 2014-12-29 NOTE — Progress Notes (Signed)
Review done for all days of admission per request of [employer]

## 2015-01-07 ENCOUNTER — Encounter: Payer: Self-pay | Admitting: Family

## 2015-01-08 MED ORDER — MELOXICAM 7.5 MG PO TABS: 7.5000 mg | ORAL_TABLET | Freq: Two times a day (BID) | ORAL | Status: DC

## 2015-01-12 ENCOUNTER — Telehealth: Payer: Self-pay | Admitting: General Practice

## 2015-01-12 NOTE — Telephone Encounter (Signed)
Patient informed of the message below and states she has not had the Prevnar 13 and will get this at her next visit.

## 2015-01-12 NOTE — Telephone Encounter (Signed)
Sheila Shields this pt will est with dr Maudie Mercury in dec 2016. Can you verify if pt is due for pneumonia vaccine?

## 2015-01-12 NOTE — Telephone Encounter (Signed)
If she has not had her Prevnar 13, then this would be available to her.

## 2015-01-12 NOTE — Telephone Encounter (Signed)
Left a message for the pt to return my call.  

## 2015-01-18 ENCOUNTER — Ambulatory Visit (INDEPENDENT_AMBULATORY_CARE_PROVIDER_SITE_OTHER)
Admission: RE | Admit: 2015-01-18 | Discharge: 2015-01-18 | Disposition: A | Discharge status: Home / Self Care | Payer: Medicare HMO | Source: Ambulatory Visit | Attending: Family | Admitting: Family

## 2015-01-18 DIAGNOSIS — Z780 Asymptomatic menopausal state: Secondary | ICD-10-CM | POA: Diagnosis not present

## 2015-02-18 ENCOUNTER — Encounter: Payer: Self-pay | Admitting: Emergency Medicine

## 2015-02-18 ENCOUNTER — Ambulatory Visit (INDEPENDENT_AMBULATORY_CARE_PROVIDER_SITE_OTHER): Payer: Medicare HMO | Admitting: Emergency Medicine

## 2015-02-18 VITALS — BP 124/86 | HR 75 | Ht 66.0 in | Wt 192.0 lb

## 2015-02-18 DIAGNOSIS — R05 Cough: Secondary | ICD-10-CM

## 2015-02-18 DIAGNOSIS — R053 Chronic cough: Secondary | ICD-10-CM

## 2015-02-18 DIAGNOSIS — J449 Chronic obstructive pulmonary disease, unspecified: Secondary | ICD-10-CM | POA: Diagnosis not present

## 2015-02-18 NOTE — Progress Notes (Signed)
Subjective:    Patient ID: Sheila Shields, female    DOB: 1935-03-19, 80 y.o.   MRN: [id number]  HPI 80 year old former smoker (60 pack years) with a history of hypertension, hyperlipidemia, GERD and COPD that was dx by PFT in 10/2013 > mild AFL. She is referred today for evaluation of chronic and persistent cough. Her breathing has been good, but she notes that ambulation for long distances is difficult due to back pain and also breathing.   I reviewed her pulmonary function testing from 10/09/13 in full which showed mild obstruction and an FEV1 of 86% of predicted.   Began to have cough in setting of URI in January, she was treated with abx x 2. On both occasions she felt better temporarily. She had a persistent cough that is now dry, has been happening since after the bronchitis. She is on prevacid since a month ago > seemed to help the cough some. She was temporarily on allergy meds OTC but stopped these. She believes that the albuterol helps her cough.   ROV 02/08/15 - follow-up visit for COPD and also chronic cough. I suspect she also has allergic rhinitis and some component of GERD.  Still on loratadine and prevacid bid.  She is not requiring ProAir. She is not having cough, wheeze. Her cough has resolved. She  Has not been needing albuterol but acknowledges that her activity level has been low since breaking her R leg     Review of Systems  Constitutional: Negative for fever and unexpected weight change.  HENT: Positive for dental problem. Negative for congestion, ear pain, nosebleeds, postnasal drip, rhinorrhea, sinus pressure, sneezing, sore throat and trouble swallowing.   Eyes: Negative for redness and itching.  Respiratory: Positive for cough and shortness of breath. Negative for chest tightness and wheezing.   Cardiovascular: Negative for palpitations and leg swelling.  Gastrointestinal: Negative for nausea and vomiting.  Genitourinary: Negative for dysuria.  Musculoskeletal:  Positive for myalgias and arthralgias. Negative for joint swelling.  Skin: Negative for rash.  Neurological: Negative for headaches.  Hematological: Does not bruise/bleed easily.  Psychiatric/Behavioral: Negative for dysphoric mood. The patient is not nervous/anxious.    Past Medical History  Diagnosis Date  . HYPERLIPIDEMIA 12/17/2006  . ANXIETY 12/18/2006  . ACUTE ALCOHOLIC INTOXICATION IN REMISSION 08/27/2007  . DEPRESSION 12/18/2006  . HYPERTENSION 12/17/2006  . COPD 12/17/2006  . GERD 05/01/2007  . ARTHRITIS 02/23/2010  . HYPERGLYCEMIA 08/27/2007  . Mood disorder   . Arthritis   . Rectal polyp 03/27/2007    adenoma  . Diverticulosis 03/27/2007     Family History  Problem Relation Age of Onset  . Throat cancer Mother   . Heart attack Father 52  . Idiopathic pulmonary fibrosis Brother 21  . Cancer Brother     sarcoma  . Colon cancer Neg Hx      Social History   Social History  . Marital Status: Married    Spouse Name: N/A  . Number of Children: 2  . Years of Education: N/A   Occupational History  . retired    Social History Main Topics  . Smoking status: Former Smoker -- 2.00 packs/day for 30 years    Types: Cigarettes    Quit date: 07/10/1984  . Smokeless tobacco: Never Used  . Alcohol Use: No     Comment: no alcoho since 1983  . Drug Use: No  . Sexual Activity: Not on file   Other Topics Concern  . Not on  file   Social History Narrative     Allergies  Allergen Reactions  . Dilaudid [Hydromorphone Hcl] Other (See Comments)    Pt had hypotension after dilaudid '1mg'$  IV while in the OR, required temporary pressor intervention.     Outpatient Prescriptions Prior to Visit  Medication Sig Dispense Refill  . Calcium Carbonate-Vitamin D (CALTRATE 600+D) 600-400 MG-UNIT per tablet Take 1 tablet by mouth 2 (two) times daily.     Marland Kitchen docusate sodium (COLACE) 100 MG capsule Take 1 capsule (100 mg total) by mouth 2 (two) times daily. 10 capsule 0  . ferrous sulfate 325  (65 FE) MG tablet TAKE 1 TABLET BY MOUTH EVERY DAY 90 tablet 3  . fish oil-omega-3 fatty acids 1000 MG capsule Take 2 g by mouth daily.    . fluticasone (FLONASE) 50 MCG/ACT nasal spray Place 2 sprays into both nostrils daily. 16 g 5  . KLOR-CON M20 20 MEQ tablet TAKE 1 TABLET BY MOUTH EVERY DAY 90 tablet 3  . lansoprazole (PREVACID) 15 MG capsule Take 1 capsule (15 mg total) by mouth daily. 90 capsule 3  . loratadine (CLARITIN) 10 MG tablet Take 1 tablet (10 mg total) by mouth daily. 30 tablet 5  . losartan-hydrochlorothiazide (HYZAAR) 100-12.5 MG per tablet TAKE 1 TABLET BY MOUTH DAILY. 90 tablet 3  . meloxicam (MOBIC) 7.5 MG tablet Take 1 tablet (7.5 mg total) by mouth 2 (two) times daily. 60 tablet 3  . Multiple Vitamin (MULTIVITAMIN WITH MINERALS) TABS tablet Take 1 tablet by mouth daily.    Marland Kitchen oxyCODONE-acetaminophen (ROXICET) 5-325 MG per tablet Take 1-2 tablets by mouth every 6 (six) hours as needed for severe pain. 30 tablet 0  . PROAIR HFA 108 (90 BASE) MCG/ACT inhaler INHALE 2 PUFFS EVERY 4 HOURS AS NEEDED FOR COUGHING SPELLS 8.5 Inhaler 0  . simvastatin (ZOCOR) 20 MG tablet TAKE 1 TABLET (20 MG TOTAL) BY MOUTH EVERY MORNING. 90 tablet 1  . aspirin EC 81 MG tablet Take 4 tablets (325 mg total) by mouth 2 (two) times daily.     No facility-administered medications prior to visit.         Objective:   Physical Exam Filed Vitals:   02/18/15 1455 02/18/15 1456  BP:  124/86  Pulse:  75  Height: '5\' 6"'$  (1.676 m)   Weight: 192 lb (87.091 kg)   SpO2:  94%   Gen: Pleasant, well-nourished, in no distress,  normal affect  ENT: No lesions,  mouth clear, erythematous post pharynx, no postnasal drip  Neck: No JVD, no TMG, no carotid bruits  Lungs: No use of accessory muscles, clear without rales or rhonchi  Cardiovascular: RRR, heart sounds normal, no murmur or gallops, no peripheral edema  Musculoskeletal: R LE is in a cast and boot s/p fracture  Neuro: alert, non focal  Skin:  Warm, no lesions or rashes      Assessment & Plan:  COPD (chronic obstructive pulmonary disease) Mild disease. We will continue albuterol when necessary  Chronic cough With probable contributions from GERD and allergic rhinitis. We will cannot peel off her maintenance medicines for both issues now that her cough is better. She knows that she may need to up titrate depending on her symptoms

## 2015-02-18 NOTE — Assessment & Plan Note (Signed)
With probable contributions from GERD and allergic rhinitis. We will cannot peel off her maintenance medicines for both issues now that her cough is better. She knows that she may need to up titrate depending on her symptoms

## 2015-02-18 NOTE — Patient Instructions (Signed)
Please continue to have your ProAir available to use as needed.  Decrease prevacid to once a day Consider stopping loratadine if your cough does not return.  Remember that you may need an allergy routine (loratadine or your fluticasone nasal spray) during the Fall and Spring months.  Follow with Dr Lamonte Sakai in 1 month

## 2015-02-18 NOTE — Assessment & Plan Note (Signed)
Mild disease. We will continue albuterol when necessary

## 2015-04-20 DIAGNOSIS — M4806 Spinal stenosis, lumbar region: Secondary | ICD-10-CM | POA: Diagnosis not present

## 2015-04-20 DIAGNOSIS — Z967 Presence of other bone and tendon implants: Secondary | ICD-10-CM | POA: Diagnosis not present

## 2015-04-20 DIAGNOSIS — S82841E Displaced bimalleolar fracture of right lower leg, subsequent encounter for open fracture type I or II with routine healing: Secondary | ICD-10-CM | POA: Diagnosis not present

## 2015-04-20 DIAGNOSIS — Z4789 Encounter for other orthopedic aftercare: Secondary | ICD-10-CM | POA: Diagnosis not present

## 2015-04-29 DIAGNOSIS — M545 Low back pain: Secondary | ICD-10-CM | POA: Diagnosis not present

## 2015-04-29 DIAGNOSIS — Z967 Presence of other bone and tendon implants: Secondary | ICD-10-CM | POA: Diagnosis not present

## 2015-04-29 DIAGNOSIS — Z8781 Personal history of (healed) traumatic fracture: Secondary | ICD-10-CM | POA: Diagnosis not present

## 2015-05-01 ENCOUNTER — Other Ambulatory Visit: Payer: Self-pay | Admitting: Family

## 2015-05-05 ENCOUNTER — Ambulatory Visit (INDEPENDENT_AMBULATORY_CARE_PROVIDER_SITE_OTHER): Payer: Medicare HMO | Admitting: Family Medicine

## 2015-05-05 ENCOUNTER — Encounter: Payer: Self-pay | Admitting: Family Medicine

## 2015-05-05 ENCOUNTER — Encounter: Payer: Self-pay | Admitting: Internal Medicine

## 2015-05-05 VITALS — BP 138/90 | HR 92 | Temp 97.4°F | Ht 66.0 in

## 2015-05-05 DIAGNOSIS — Z23 Encounter for immunization: Secondary | ICD-10-CM | POA: Diagnosis not present

## 2015-05-05 DIAGNOSIS — R42 Dizziness and giddiness: Secondary | ICD-10-CM | POA: Diagnosis not present

## 2015-05-05 NOTE — Patient Instructions (Addendum)
Take the medication if needed for vertigo  Call if not continuing to improve of this week and you wish to pursue physical therapy  Seek care immediately if new concerns, worsening or not improving  Use walker and caution with ambulation  No driving   Benign Positional Vertigo Vertigo is the feeling that you or your surroundings are moving when they are not. Benign positional vertigo is the most common form of vertigo. The cause of this condition is not serious (is benign). This condition is triggered by certain movements and positions (is positional). This condition can be dangerous if it occurs while you are doing something that could endanger you or others, such as driving.  CAUSES In many cases, the cause of this condition is not known. It may be caused by a disturbance in an area of the inner ear that helps your brain to sense movement and balance. This disturbance can be caused by a viral infection (labyrinthitis), head injury, or repetitive motion. RISK FACTORS This condition is more likely to develop in:  Women.  People who are 33 years of age or older. SYMPTOMS Symptoms of this condition usually happen when you move your head or your eyes in different directions. Symptoms may start suddenly, and they usually last for less than a minute. Symptoms may include:  Loss of balance and falling.  Feeling like you are spinning or moving.  Feeling like your surroundings are spinning or moving.  Nausea and vomiting.  Blurred vision.  Dizziness.  Involuntary eye movement (nystagmus). Symptoms can be mild and cause only slight annoyance, or they can be severe and interfere with daily life. Episodes of benign positional vertigo may return (recur) over time, and they may be triggered by certain movements. Symptoms may improve over time. DIAGNOSIS This condition is usually diagnosed by medical history and a physical exam of the head, neck, and ears. You may be referred to a health care  provider who specializes in ear, nose, and throat (ENT) problems (otolaryngologist) or a provider who specializes in disorders of the nervous system (neurologist). You may have additional testing, including:  MRI.  A CT scan.  Eye movement tests. Your health care provider may ask you to change positions quickly while he or she watches you for symptoms of benign positional vertigo, such as nystagmus. Eye movement may be tested with an electronystagmogram (ENG), caloric stimulation, the Dix-Hallpike test, or the roll test.  An electroencephalogram (EEG). This records electrical activity in your brain.  Hearing tests. TREATMENT Usually, your health care provider will treat this by moving your head in specific positions to adjust your inner ear back to normal. Surgery may be needed in severe cases, but this is rare. In some cases, benign positional vertigo may resolve on its own in 2-4 weeks. HOME CARE INSTRUCTIONS Safety  Move slowly.Avoid sudden body or head movements.  Avoid driving.  Avoid operating heavy machinery.  Avoid doing any tasks that would be dangerous to you or others if a vertigo episode would occur.  If you have trouble walking or keeping your balance, try using a cane for stability. If you feel dizzy or unstable, sit down right away.  Return to your normal activities as told by your health care provider. Ask your health care provider what activities are safe for you. General Instructions  Take over-the-counter and prescription medicines only as told by your health care provider.  Avoid certain positions or movements as told by your health care provider.  Drink enough fluid  to keep your urine clear or pale yellow.  Keep all follow-up visits as told by your health care provider. This is important. SEEK MEDICAL CARE IF:  You have a fever.  Your condition gets worse or you develop new symptoms.  Your family or friends notice any behavioral changes.  Your nausea  or vomiting gets worse.  You have numbness or a "pins and needles" sensation. SEEK IMMEDIATE MEDICAL CARE IF:  You have difficulty speaking or moving.  You are always dizzy.  You faint.  You develop severe headaches.  You have weakness in your legs or arms.  You have changes in your hearing or vision.  You develop a stiff neck.  You develop sensitivity to light.   This information is not intended to replace advice given to you by your health care provider. Make sure you discuss any questions you have with your health care provider.   Document Released: 04/03/2006 Document Revised: 03/17/2015 Document Reviewed: 10/19/2014 Elsevier Interactive Patient Education [employer].

## 2015-05-05 NOTE — Progress Notes (Signed)
HPI:  Acute visit for:  Vertigo: -started suddenly with a quick turn of the head yesterday morning and occurred with certain movements throughout the day; vertigo with nausea -reports is doing somewhat better today -denies: trauma, syncope, severe headache, hearing loss, vision changes, fevers, malaise, weakness, numbness, hx of vertigo   ROS: See pertinent positives and negatives per HPI.  Past Medical History  Diagnosis Date  . HYPERLIPIDEMIA 12/17/2006  . ANXIETY 12/18/2006  . ACUTE ALCOHOLIC INTOXICATION IN REMISSION 08/27/2007  . DEPRESSION 12/18/2006  . HYPERTENSION 12/17/2006  . COPD 12/17/2006  . GERD 05/01/2007  . ARTHRITIS 02/23/2010  . HYPERGLYCEMIA 08/27/2007  . Mood disorder ([place])   . Arthritis   . Rectal polyp 03/27/2007    adenoma  . Diverticulosis 03/27/2007    Past Surgical History  Procedure Laterality Date  . Breast biopsy      negative  . Hammer toe surgery  2008    left foot  . Colonoscopy w/ polypectomy  03/27/2007; n7/19/2012    2008: 4 mm adenoma, diverticulosis 2012: ileitis ? NSAID - likely, 2-3 mm cecal polyp LYMPHOID FOLLICLE, diverticulosis  . Esophagogastroduodenoscopy  01/26/2011    reflux esophagitis, colonscopy done also  . Appendectomy  2002  . Abdominal hysterectomy  1978    fibroma  . Carpal tunnel release Left 2010 or 2011  . Shoulder open rotator cuff repair Left 03/06/2013    Procedure: LEFT ROTATOR CUFF REPAIR, SUBACROMIAL DECOMPRESSION, PATCH GRAFT, MANIPULATION UNDER ANESTHESIA;  Surgeon: Johnn Hai, MD;  Location: [facility];  Service: Orthopedics;  Laterality: Left;  . Orif ankle fracture Right 12/22/2014    Procedure: OPEN REDUCTION INTERNAL FIXATION (ORIF) ANKLE FRACTURE;  Surgeon: Susa Day, MD;  Location: [facility];  Service: Orthopedics;  Laterality: Right;    Family History  Problem Relation Age of Onset  . Throat cancer Mother   . Heart attack Father 82  . Idiopathic pulmonary fibrosis Brother 38  . Cancer Brother    sarcoma  . Colon cancer Neg Hx     Social History   Social History  . Marital Status: Married    Spouse Name: N/A  . Number of Children: 2  . Years of Education: N/A   Occupational History  . retired    Social History Main Topics  . Smoking status: Former Smoker -- 2.00 packs/day for 30 years    Types: Cigarettes    Quit date: 07/10/1984  . Smokeless tobacco: Never Used  . Alcohol Use: No     Comment: no alcoho since 1983  . Drug Use: No  . Sexual Activity: Not Asked   Other Topics Concern  . None   Social History Narrative     Current outpatient prescriptions:  .  aspirin 325 MG tablet, Take 325 mg by mouth daily., Disp: , Rfl:  .  Calcium Carbonate-Vitamin D (CALTRATE 600+D) 600-400 MG-UNIT per tablet, Take 1 tablet by mouth 2 (two) times daily. , Disp: , Rfl:  .  docusate sodium (COLACE) 100 MG capsule, Take 1 capsule (100 mg total) by mouth 2 (two) times daily., Disp: 10 capsule, Rfl: 0 .  ferrous sulfate 325 (65 FE) MG tablet, TAKE 1 TABLET BY MOUTH EVERY DAY, Disp: 90 tablet, Rfl: 3 .  fish oil-omega-3 fatty acids 1000 MG capsule, Take 2 g by mouth daily., Disp: , Rfl:  .  fluticasone (FLONASE) 50 MCG/ACT nasal spray, Place 2 sprays into both nostrils daily., Disp: 16 g, Rfl: 5 .  KLOR-CON M20 20 MEQ  tablet, TAKE 1 TABLET BY MOUTH EVERY DAY, Disp: 90 tablet, Rfl: 3 .  lansoprazole (PREVACID) 15 MG capsule, Take 1 capsule (15 mg total) by mouth daily., Disp: 90 capsule, Rfl: 3 .  loratadine (CLARITIN) 10 MG tablet, Take 1 tablet (10 mg total) by mouth daily., Disp: 30 tablet, Rfl: 5 .  losartan-hydrochlorothiazide (HYZAAR) 100-12.5 MG per tablet, TAKE 1 TABLET BY MOUTH DAILY., Disp: 90 tablet, Rfl: 3 .  meloxicam (MOBIC) 7.5 MG tablet, Take 1 tablet (7.5 mg total) by mouth 2 (two) times daily., Disp: 60 tablet, Rfl: 3 .  Multiple Vitamin (MULTIVITAMIN WITH MINERALS) TABS tablet, Take 1 tablet by mouth daily., Disp: , Rfl:  .  oxyCODONE-acetaminophen (ROXICET) 5-325  MG per tablet, Take 1-2 tablets by mouth every 6 (six) hours as needed for severe pain., Disp: 30 tablet, Rfl: 0 .  PROAIR HFA 108 (90 BASE) MCG/ACT inhaler, INHALE 2 PUFFS EVERY 4 HOURS AS NEEDED FOR COUGHING SPELLS, Disp: 8.5 Inhaler, Rfl: 0 .  simvastatin (ZOCOR) 20 MG tablet, TAKE 1 TABLET BY MOUTH IN THE MORNING, Disp: 90 tablet, Rfl: 1  EXAM:  Filed Vitals:   05/05/15 1116  BP: 138/90  Pulse: 92  Temp: 97.4 F (36.3 C)    There is no weight on file to calculate BMI.  GENERAL: vitals reviewed and listed above, alert, oriented, appears well hydrated and in no acute distress  HEENT: atraumatic, conjunttiva clear PERRLA, ear canals normal, TMs normal, no obvious abnormalities on inspection of external nose and ears  NECK: no obvious masses on inspection, no bruit  LUNGS: clear to auscultation bilaterally, no wheezes, rales or rhonchi, good air movement  CV: HRRR, no peripheral edema  MS: moves all extremities without noticeable abnormality  PSYCH: pleasant and cooperative, no obvious depression or anxiety  NEURO: gait normal, walks with walker due to recovering from ankle fx, PERRLA, CN II-XII grossly intact, finger to nose normal, dix halpike normal  ASSESSMENT AND PLAN:  Discussed the following assessment and plan:  Vertigo  Need for prophylactic vaccination against Streptococcus pneumoniae (pneumococcus) - Plan: Pneumococcal conjugate vaccine 13-valent IM  -we discussed possible serious and likely etiologies, workup and treatment, treatment risks and return precautions -after this discussion, [place] opted for mild positional vertigo felt to be most likely with a normal exam today so may have resolved spontaneously with sleeping position -follow up advised if worsening, new symptoms, not continuing to resolve; meclizine given, may consider vestibular rehab if recurs -discussed serious causes of vertigo and emergency precautions -of course, we advised [place]  to return or  notify a doctor immediately if symptoms worsen or persist or new concerns arise.  -Patient advised to return or notify a doctor immediately if symptoms worsen or persist or new concerns arise.  Patient Instructions  Take the medication if needed for vertigo  Call if not continuing to improve of this week and you wish to pursue physical therapy  Seek care immediately if new concerns, worsening or not improving  Use walker and caution with ambulation  No driving   Benign Positional Vertigo Vertigo is the feeling that you or your surroundings are moving when they are not. Benign positional vertigo is the most common form of vertigo. The cause of this condition is not serious (is benign). This condition is triggered by certain movements and positions (is positional). This condition can be dangerous if it occurs while you are doing something that could endanger you or others, such as driving.  CAUSES In many  cases, the cause of this condition is not known. It may be caused by a disturbance in an area of the inner ear that helps your brain to sense movement and balance. This disturbance can be caused by a viral infection (labyrinthitis), head injury, or repetitive motion. RISK FACTORS This condition is more likely to develop in:  Women.  People who are 11 years of age or older. SYMPTOMS Symptoms of this condition usually happen when you move your head or your eyes in different directions. Symptoms may start suddenly, and they usually last for less than a minute. Symptoms may include:  Loss of balance and falling.  Feeling like you are spinning or moving.  Feeling like your surroundings are spinning or moving.  Nausea and vomiting.  Blurred vision.  Dizziness.  Involuntary eye movement (nystagmus). Symptoms can be mild and cause only slight annoyance, or they can be severe and interfere with daily life. Episodes of benign positional vertigo may return (recur) over time, and they may  be triggered by certain movements. Symptoms may improve over time. DIAGNOSIS This condition is usually diagnosed by medical history and a physical exam of the head, neck, and ears. You may be referred to a health care provider who specializes in ear, nose, and throat (ENT) problems (otolaryngologist) or a provider who specializes in disorders of the nervous system (neurologist). You may have additional testing, including:  MRI.  A CT scan.  Eye movement tests. Your health care provider may ask you to change positions quickly while he or she watches you for symptoms of benign positional vertigo, such as nystagmus. Eye movement may be tested with an electronystagmogram (ENG), caloric stimulation, the Dix-Hallpike test, or the roll test.  An electroencephalogram (EEG). This records electrical activity in your brain.  Hearing tests. TREATMENT Usually, your health care provider will treat this by moving your head in specific positions to adjust your inner ear back to normal. Surgery may be needed in severe cases, but this is rare. In some cases, benign positional vertigo may resolve on its own in 2-4 weeks. HOME CARE INSTRUCTIONS Safety  Move slowly.Avoid sudden body or head movements.  Avoid driving.  Avoid operating heavy machinery.  Avoid doing any tasks that would be dangerous to you or others if a vertigo episode would occur.  If you have trouble walking or keeping your balance, try using a cane for stability. If you feel dizzy or unstable, sit down right away.  Return to your normal activities as told by your health care provider. Ask your health care provider what activities are safe for you. General Instructions  Take over-the-counter and prescription medicines only as told by your health care provider.  Avoid certain positions or movements as told by your health care provider.  Drink enough fluid to keep your urine clear or pale yellow.  Keep all follow-up visits as told by  your health care provider. This is important. SEEK MEDICAL CARE IF:  You have a fever.  Your condition gets worse or you develop new symptoms.  Your family or friends notice any behavioral changes.  Your nausea or vomiting gets worse.  You have numbness or a "pins and needles" sensation. SEEK IMMEDIATE MEDICAL CARE IF:  You have difficulty speaking or moving.  You are always dizzy.  You faint.  You develop severe headaches.  You have weakness in your legs or arms.  You have changes in your hearing or vision.  You develop a stiff neck.  You develop sensitivity  to light.   This information is not intended to replace advice given to you by your health care provider. Make sure you discuss any questions you have with your health care provider.   Document Released: 04/03/2006 Document Revised: 03/17/2015 Document Reviewed: 10/19/2014 Elsevier Interactive Patient Education 2016 [place], [place]

## 2015-05-05 NOTE — Progress Notes (Signed)
Pre visit review using our clinic review tool, if applicable. No additional management support is needed unless otherwise documented below in the visit note. 

## 2015-05-05 NOTE — Progress Notes (Signed)
Weight not measured today due to patient using a walker and vertigo.

## 2015-05-06 ENCOUNTER — Telehealth: Payer: Self-pay | Admitting: Family Medicine

## 2015-05-06 ENCOUNTER — Other Ambulatory Visit: Payer: Self-pay | Admitting: Family Medicine

## 2015-05-06 MED ORDER — MECLIZINE HCL 25 MG PO TABS: 25.0000 mg | ORAL_TABLET | Freq: Three times a day (TID) | ORAL | Status: DC | PRN

## 2015-05-06 NOTE — Telephone Encounter (Signed)
Pt call to say that a rx was suppose to be called in to the her pharmacy for [place]

## 2015-05-09 ENCOUNTER — Encounter: Payer: Self-pay | Admitting: Family Medicine

## 2015-05-09 DIAGNOSIS — R42 Dizziness and giddiness: Secondary | ICD-10-CM

## 2015-05-10 NOTE — Telephone Encounter (Signed)
Order placed and the pt was notified via Mychart message.

## 2015-05-12 ENCOUNTER — Encounter: Payer: Self-pay | Admitting: Family Medicine

## 2015-05-12 DIAGNOSIS — M4806 Spinal stenosis, lumbar region: Secondary | ICD-10-CM | POA: Diagnosis not present

## 2015-05-13 DIAGNOSIS — M9901 Segmental and somatic dysfunction of cervical region: Secondary | ICD-10-CM | POA: Diagnosis not present

## 2015-05-13 DIAGNOSIS — M542 Cervicalgia: Secondary | ICD-10-CM | POA: Diagnosis not present

## 2015-05-14 ENCOUNTER — Other Ambulatory Visit: Payer: Self-pay | Admitting: *Deleted

## 2015-05-14 DIAGNOSIS — R42 Dizziness and giddiness: Secondary | ICD-10-CM

## 2015-05-14 NOTE — Telephone Encounter (Signed)
I called the pt and apologized to her as the code that I entered was not sent correctly through the system and someone will call her with appt info.  She stated she already took care of this herself as she was seen by her chiropractor and he straightened her neck and gave her some exercises to do and she feels somewhat better.

## 2015-05-14 NOTE — Telephone Encounter (Signed)
Sheila Shields. She is doing well. Agrees to call if concerns return.

## 2015-05-17 DIAGNOSIS — M9901 Segmental and somatic dysfunction of cervical region: Secondary | ICD-10-CM | POA: Diagnosis not present

## 2015-05-17 DIAGNOSIS — M542 Cervicalgia: Secondary | ICD-10-CM | POA: Diagnosis not present

## 2015-05-19 ENCOUNTER — Other Ambulatory Visit: Payer: Self-pay | Admitting: Family

## 2015-05-19 ENCOUNTER — Other Ambulatory Visit: Payer: Self-pay | Admitting: Emergency Medicine

## 2015-05-24 DIAGNOSIS — J300 Vasomotor rhinitis: Secondary | ICD-10-CM | POA: Diagnosis not present

## 2015-05-24 DIAGNOSIS — R05 Cough: Secondary | ICD-10-CM | POA: Diagnosis not present

## 2015-05-28 DIAGNOSIS — D1801 Hemangioma of skin and subcutaneous tissue: Secondary | ICD-10-CM | POA: Diagnosis not present

## 2015-05-28 DIAGNOSIS — L821 Other seborrheic keratosis: Secondary | ICD-10-CM | POA: Diagnosis not present

## 2015-05-28 DIAGNOSIS — L812 Freckles: Secondary | ICD-10-CM | POA: Diagnosis not present

## 2015-05-28 DIAGNOSIS — L853 Xerosis cutis: Secondary | ICD-10-CM | POA: Diagnosis not present

## 2015-05-28 DIAGNOSIS — Z85820 Personal history of malignant melanoma of skin: Secondary | ICD-10-CM | POA: Diagnosis not present

## 2015-06-01 DIAGNOSIS — M542 Cervicalgia: Secondary | ICD-10-CM | POA: Diagnosis not present

## 2015-06-01 DIAGNOSIS — M9901 Segmental and somatic dysfunction of cervical region: Secondary | ICD-10-CM | POA: Diagnosis not present

## 2015-06-08 DIAGNOSIS — Z8781 Personal history of (healed) traumatic fracture: Secondary | ICD-10-CM | POA: Diagnosis not present

## 2015-06-08 DIAGNOSIS — M545 Low back pain: Secondary | ICD-10-CM | POA: Diagnosis not present

## 2015-06-08 DIAGNOSIS — Z967 Presence of other bone and tendon implants: Secondary | ICD-10-CM | POA: Diagnosis not present

## 2015-06-21 ENCOUNTER — Encounter: Payer: Self-pay | Admitting: Family Medicine

## 2015-06-21 ENCOUNTER — Ambulatory Visit (INDEPENDENT_AMBULATORY_CARE_PROVIDER_SITE_OTHER): Payer: Medicare HMO | Admitting: Family Medicine

## 2015-06-21 VITALS — BP 130/78 | HR 93 | Temp 97.5°F | Ht 66.0 in | Wt 201.1 lb

## 2015-06-21 DIAGNOSIS — D509 Iron deficiency anemia, unspecified: Secondary | ICD-10-CM

## 2015-06-21 DIAGNOSIS — I10 Essential (primary) hypertension: Secondary | ICD-10-CM | POA: Diagnosis not present

## 2015-06-21 DIAGNOSIS — J449 Chronic obstructive pulmonary disease, unspecified: Secondary | ICD-10-CM

## 2015-06-21 DIAGNOSIS — R739 Hyperglycemia, unspecified: Secondary | ICD-10-CM

## 2015-06-21 DIAGNOSIS — Z7689 Persons encountering health services in other specified circumstances: Secondary | ICD-10-CM

## 2015-06-21 DIAGNOSIS — Z7189 Other specified counseling: Secondary | ICD-10-CM | POA: Diagnosis not present

## 2015-06-21 DIAGNOSIS — E785 Hyperlipidemia, unspecified: Secondary | ICD-10-CM | POA: Diagnosis not present

## 2015-06-21 DIAGNOSIS — G4700 Insomnia, unspecified: Secondary | ICD-10-CM

## 2015-06-21 NOTE — Progress Notes (Signed)
HPI:  Sheila Shields is a very pleasant 80 yo F here to establish care.   Has the following chronic problems that require follow up and concerns today:  HTN: -meds: losartan-hctz 10012.5 -stable  HLD: -meds: simvastatin, fish oil, asa -stable  COPD/Allergies: -sees Dr. Lamonte Sakai in pulmonology -meds: pro air prn, flonase, claritin -no recent alb use  GERD: -meds: prevacid  Anemia: -takes iron -reports prior PCP said she had anemia and advise daily iron -denies GI bleeding, hx GI bleed or ulcer, melena, hematochezia  OA/hx R ankle fx/Chronic back pain: -takes meloxicam bid -sees Dr. Maxie Better  Insomnia: -mild, once per week has difficulty getting to sleep -denies current or recent depression or anxiety -no alcohol use in a long time  ROS negative for unless reported above: fevers, unintentional weight loss, hearing or vision loss, chest pain, palpitations, struggling to breath, hemoptysis, melena, hematochezia, hematuria, falls, loc, si, thoughts of self harm  Past Medical History  Diagnosis Date  . HYPERLIPIDEMIA 12/17/2006  . HYPERTENSION 12/17/2006  . COPD 12/17/2006  . GERD 05/01/2007  . HYPERGLYCEMIA 08/27/2007  . Mood disorder ([facility])     hx anxiety and depression  . Arthritis     OA  . Rectal polyp 03/27/2007    adenoma  . Diverticulosis 03/27/2007  . Alcoholism in remission ([place]) 08/27/2007  . Anemia     Past Surgical History  Procedure Laterality Date  . Breast biopsy      negative  . Hammer toe surgery  2008    left foot  . Colonoscopy w/ polypectomy  03/27/2007; n7/19/2012    2008: 4 mm adenoma, diverticulosis 2012: ileitis ? NSAID - likely, 2-3 mm cecal polyp LYMPHOID FOLLICLE, diverticulosis  . Esophagogastroduodenoscopy  01/26/2011    reflux esophagitis, colonscopy done also  . Appendectomy  2002  . Abdominal hysterectomy  1978    fibroma  . Carpal tunnel release Left 2010 or 2011  . Shoulder open rotator cuff repair Left 03/06/2013    Procedure: LEFT  ROTATOR CUFF REPAIR, SUBACROMIAL DECOMPRESSION, PATCH GRAFT, MANIPULATION UNDER ANESTHESIA;  Surgeon: Johnn Hai, MD;  Location: [facility];  Service: Orthopedics;  Laterality: Left;  . Orif ankle fracture Right 12/22/2014    Procedure: OPEN REDUCTION INTERNAL FIXATION (ORIF) ANKLE FRACTURE;  Surgeon: Susa Day, MD;  Location: [facility];  Service: Orthopedics;  Laterality: Right;    Family History  Problem Relation Age of Onset  . Throat cancer Mother   . Heart attack Father 25  . Idiopathic pulmonary fibrosis Brother 18  . Cancer Brother     sarcoma  . Colon cancer Neg Hx     Social History   Social History  . Marital Status: Married    Spouse Name: N/A  . Number of Children: 2  . Years of Education: N/A   Occupational History  . retired    Social History Main Topics  . Smoking status: Former Smoker -- 2.00 packs/day for 30 years    Types: Cigarettes    Quit date: 07/10/1984  . Smokeless tobacco: Never Used  . Alcohol Use: No     Comment: no alcoho since 1983  . Drug Use: No  . Sexual Activity: Not Asked   Other Topics Concern  . None   Social History Narrative   Work or School: very active in [place] Situation: lives with husband      Spiritual Beliefs: Christian      Lifestyle:tries to  walk; diet ok - denies any alcohol or tobacco use in many many years           Current outpatient prescriptions:  .  aspirin 81 MG tablet, Take 81 mg by mouth daily., Disp: , Rfl:  .  Calcium Carbonate-Vitamin D (CALTRATE 600+D) 600-400 MG-UNIT per tablet, Take 1 tablet by mouth 2 (two) times daily. , Disp: , Rfl:  .  ferrous sulfate 325 (65 FE) MG tablet, TAKE 1 TABLET BY MOUTH EVERY DAY, Disp: 90 tablet, Rfl: 3 .  fish oil-omega-3 fatty acids 1000 MG capsule, Take 2 g by mouth daily., Disp: , Rfl:  .  fluticasone (FLONASE) 50 MCG/ACT nasal spray, Place 2 sprays into both nostrils daily., Disp: 16 g, Rfl: 5 .  KLOR-CON M20 20 MEQ tablet, TAKE 1 TABLET BY  MOUTH EVERY DAY, Disp: 90 tablet, Rfl: 3 .  lansoprazole (PREVACID) 15 MG capsule, Take 1 capsule (15 mg total) by mouth daily., Disp: 90 capsule, Rfl: 3 .  loratadine (CLARITIN) 10 MG tablet, TAKE 1 TABLET BY MOUTH EVERY DAY, Disp: 30 tablet, Rfl: 5 .  losartan-hydrochlorothiazide (HYZAAR) 100-12.5 MG per tablet, TAKE 1 TABLET BY MOUTH DAILY., Disp: 90 tablet, Rfl: 3 .  Multiple Vitamin (MULTIVITAMIN WITH MINERALS) TABS tablet, Take 1 tablet by mouth daily., Disp: , Rfl:  .  PROAIR HFA 108 (90 BASE) MCG/ACT inhaler, INHALE 2 PUFFS EVERY 4 HOURS AS NEEDED FOR COUGHING SPELLS, Disp: 8.5 Inhaler, Rfl: 0 .  simvastatin (ZOCOR) 20 MG tablet, TAKE 1 TABLET BY MOUTH IN THE MORNING, Disp: 90 tablet, Rfl: 1  EXAM:  Filed Vitals:   06/21/15 1116  BP: 130/78  Pulse: 93  Temp: 97.5 F (36.4 C)    Body mass index is 32.47 kg/(m^2).  GENERAL: vitals reviewed and listed above, alert, oriented, appears well hydrated and in no acute distress  HEENT: atraumatic, conjunttiva clear, no obvious abnormalities on inspection of external nose and ears  NECK: no obvious masses on inspection  LUNGS: clear to auscultation bilaterally, no wheezes, rales or rhonchi, good air movement  CV: HRRR, tr bilat edema  MS: moves all extremities without noticeable abnormality  PSYCH: pleasant and cooperative, no obvious depression or anxiety  ASSESSMENT AND PLAN:  Discussed the following assessment and plan:  Encounter to establish care with new doctor -We reviewed the PMH, PSH, FH, SH, Meds and Allergies. -We provided refills for any medications we will prescribe as needed. -We addressed current concerns per orders and patient instructions. -We have asked for records for pertinent exams, studies, vaccines and notes from previous providers. -We have advised patient to follow up per instructions below. Hyperlipemia - Plan: Lipid Panel  Essential hypertension - Plan: Basic metabolic panel -cont medications,  lifestyle recs  Iron deficiency anemia, unspecified - Plan: CBC (no diff) -check cbc - if ok trial stopping iron -stop daily prophylactic nsaids  Chronic obstructive pulmonary disease, unspecified COPD type (HCC) -stable and no symptoms currently  Hyperglycemia - Plan: Hemoglobin A1c -recheck -lifestyle recs  Insomnia -per handout  -Patient advised to return or notify a doctor immediately if symptoms worsen or persist or new concerns arise.  Patient Instructions  BEFORE YOU LEAVE: -schedule fasting labs in next 4 weeks -follow up in 4 months  Please make sur eyou are getting '1200mg'$  daily of calcium in diet - but do not take extra  [phone] IU Vit D3 daily  Stop daily meloxicam - use tylenol 500-'1000mg'$  up to 3 times daily on AS NEEDED for  pain  If no anemia on labs would try trial off of the iron and recheck anemia at follow up  -We have ordered labs or studies at this visit. It can take up to 1-2 weeks for results and processing. We will contact you with instructions IF your results are abnormal. Normal results will be released to your [facility]. If you have not heard from [place] or can not find your results in [facility] in 2 weeks please contact our office.  We recommend the following healthy lifestyle measures: - eat a healthy whole foods diet consisting of regular small meals composed of vegetables, fruits, beans, nuts, seeds, healthy meats such as white chicken and fish and whole grains.  - avoid sweets, white starchy foods, fried foods, fast food, processed foods, sodas, red meet and other fattening foods.  - get a least 150-300 minutes of aerobic exercise per week.   FOR IMPROVED SLEEP AND TO RESET YOUR SLEEP SCHEDULE: '[]'$  exercise 30 minutes daily  '[]'$  go to bed and wake up at the same time  '[]'$  keep bedroom cool, dark and quiet  '[]'$  reserve bed for sleep - do not read, watch TV, etc in bed  '[]'$  If you toss and turn more then 15-20 minutes get out of bed and list thoughts/do  quite activity then go back to bed; repeat as needed; do not worry about when you eventually fall asleep - still get up at the same time and turn on lights and take shower  '[]'$ get counseling  '[]'$  some people find that a half dose of benadryl, melatonin, tylenol pm or unisom on a few nights per week is helpful initially for a few weeks  '[]'$ seek help and treat any depression or anxiety  '[]'$ prescription strength sleep medications should only be used in severe cases of insomnia if other measures fail and should be used sparingly           Valdemar Mcclenahan, Jarrett Soho R.

## 2015-06-21 NOTE — Progress Notes (Signed)
Pre visit review using our clinic review tool, if applicable. No additional management support is needed unless otherwise documented below in the visit note. 

## 2015-06-21 NOTE — Patient Instructions (Signed)
BEFORE YOU LEAVE: -schedule fasting labs in next 4 weeks -follow up in 4 months  Please make sur eyou are getting '1200mg'$  daily of calcium in diet - but do not take extra  [phone] IU Vit D3 daily  Stop daily meloxicam - use tylenol 500-'1000mg'$  up to 3 times daily on AS NEEDED for pain  If no anemia on labs would try trial off of the iron and recheck anemia at follow up  -We have ordered labs or studies at this visit. It can take up to 1-2 weeks for results and processing. We will contact you with instructions IF your results are abnormal. Normal results will be released to your [facility]. If you have not heard from [place] or can not find your results in [facility] in 2 weeks please contact our office.  We recommend the following healthy lifestyle measures: - eat a healthy whole foods diet consisting of regular small meals composed of vegetables, fruits, beans, nuts, seeds, healthy meats such as white chicken and fish and whole grains.  - avoid sweets, white starchy foods, fried foods, fast food, processed foods, sodas, red meet and other fattening foods.  - get a least 150-300 minutes of aerobic exercise per week.   FOR IMPROVED SLEEP AND TO RESET YOUR SLEEP SCHEDULE: '[]'$  exercise 30 minutes daily  '[]'$  go to bed and wake up at the same time  '[]'$  keep bedroom cool, dark and quiet  '[]'$  reserve bed for sleep - do not read, watch TV, etc in bed  '[]'$  If you toss and turn more then 15-20 minutes get out of bed and list thoughts/do quite activity then go back to bed; repeat as needed; do not worry about when you eventually fall asleep - still get up at the same time and turn on lights and take shower  '[]'$ get counseling  '[]'$  some people find that a half dose of benadryl, melatonin, tylenol pm or unisom on a few nights per week is helpful initially for a few weeks  '[]'$ seek help and treat any depression or anxiety  '[]'$ prescription strength sleep medications should only be used in severe cases of insomnia if  other measures fail and should be used sparingly

## 2015-06-28 ENCOUNTER — Other Ambulatory Visit: Payer: Medicare HMO

## 2015-06-28 LAB — BASIC METABOLIC PANEL
BUN: 17 mg/dL (ref 6–23)
CALCIUM: 9.9 mg/dL (ref 8.4–10.5)
CO2: 30 mEq/L (ref 19–32)
Chloride: 104 mEq/L (ref 96–112)
Creatinine, Ser: 0.89 mg/dL (ref 0.40–1.20)
GFR: 65.13 mL/min (ref >60.00)
Glucose, Bld: 109 mg/dL — ABNORMAL HIGH (ref 70–99)
POTASSIUM: 4.3 meq/L (ref 3.5–5.1)
SODIUM: 143 meq/L (ref 135–145)

## 2015-06-28 LAB — CBC
HCT: 41.9 % (ref 36.0–46.0)
HEMOGLOBIN: 14 g/dL (ref 12.0–15.0)
MCHC: 33.4 g/dL (ref 30.0–36.0)
MCV: 91.2 fl (ref 78.0–100.0)
PLATELETS: 294 10*3/uL (ref 150.0–400.0)
RBC: 4.59 Mil/uL (ref 3.87–5.11)
RDW: 14.4 % (ref 11.5–15.5)
WBC: 7.4 10*3/uL (ref 4.0–10.5)

## 2015-06-28 LAB — LIPID PANEL
CHOL/HDL RATIO: 4
Cholesterol: 204 mg/dL — ABNORMAL HIGH (ref 0–200)
HDL: 46.5 mg/dL (ref >39.00)
LDL CALC: 119 mg/dL — AB (ref 0–99)
NONHDL: 157.78
Triglycerides: 193 mg/dL — ABNORMAL HIGH (ref 0.0–149.0)
VLDL: 38.6 mg/dL (ref 0.0–40.0)

## 2015-06-28 LAB — HEMOGLOBIN A1C: HEMOGLOBIN A1C: 6.1 % (ref 4.6–6.5)

## 2015-07-24 ENCOUNTER — Other Ambulatory Visit: Payer: Self-pay | Admitting: Family

## 2015-07-27 ENCOUNTER — Encounter: Payer: Self-pay | Admitting: Family Medicine

## 2015-08-13 ENCOUNTER — Encounter: Payer: Self-pay | Admitting: Emergency Medicine

## 2015-08-13 ENCOUNTER — Ambulatory Visit (INDEPENDENT_AMBULATORY_CARE_PROVIDER_SITE_OTHER): Payer: Medicare HMO | Admitting: Emergency Medicine

## 2015-08-13 VITALS — BP 112/78 | HR 68 | Ht 66.0 in | Wt 204.0 lb

## 2015-08-13 DIAGNOSIS — J449 Chronic obstructive pulmonary disease, unspecified: Secondary | ICD-10-CM | POA: Diagnosis not present

## 2015-08-13 DIAGNOSIS — K219 Gastro-esophageal reflux disease without esophagitis: Secondary | ICD-10-CM | POA: Diagnosis not present

## 2015-08-13 DIAGNOSIS — B9789 Other viral agents as the cause of diseases classified elsewhere: Principal | ICD-10-CM

## 2015-08-13 DIAGNOSIS — J069 Acute upper respiratory infection, unspecified: Secondary | ICD-10-CM | POA: Insufficient documentation

## 2015-08-13 MED ORDER — ALBUTEROL SULFATE HFA 108 (90 BASE) MCG/ACT IN AERS: INHALATION_SPRAY | RESPIRATORY_TRACT | Status: DC

## 2015-08-13 MED ORDER — TIOTROPIUM BROMIDE MONOHYDRATE 18 MCG IN CAPS: 18.0000 ug | ORAL_CAPSULE | Freq: Every day | RESPIRATORY_TRACT | Status: DC

## 2015-08-13 NOTE — Progress Notes (Signed)
Subjective:    Patient ID: Sheila Shields, female    DOB: [birth year], 81 y.o.   MRN: [id number]  HPI 81 year old former smoker (60 pack years) with a history of hypertension, hyperlipidemia, GERD and COPD that was dx by PFT in 10/2013 > mild AFL. She is referred today for evaluation of chronic and persistent cough. Her breathing has been good, but she notes that ambulation for long distances is difficult due to back pain and also breathing.   I reviewed her pulmonary function testing from 10/09/13 in full which showed mild obstruction and an FEV1 of 86% of predicted.   Began to have cough in setting of URI in January, she was treated with abx x 2. On both occasions she felt better temporarily. She had a persistent cough that is now dry, has been happening since after the bronchitis. She is on prevacid since a month ago > seemed to help the cough some. She was temporarily on allergy meds OTC but stopped these. She believes that the albuterol helps her cough.   ROV 02/18/15 - follow-up visit for COPD and also chronic cough. I suspect she also has allergic rhinitis and some component of GERD.  Still on loratadine and prevacid bid.  She is not requiring ProAir. She is not having cough, wheeze. Her cough has resolved. She  Has not been needing albuterol but acknowledges that her activity level has been low since breaking her R leg  ROV 08/13/15 - follow up visit for COPD and chronic cough. She tried to stop her Prevacid and had recurrence of her symptoms. She has restarted her Prevacid and is still on Claritin daily. Her chronic cough is controlled. She has been having worsening dyspnea with exertion. She is dyspneic with normal activities. She has occasional wheezing. For the past 3-4 days, she has been having a cough productive of light green mucous, dyspnea and chest pressure. No worsening of her chronic wheezing or dyspnea with exertion. She also has rhinorrhea, sinus congestion and post nasal drip. No fevers  or chills. +sick contacts.    Review of Systems  Constitutional: Negative for fever and unexpected weight change.  HENT: Positive for dental problem. Negative for congestion, ear pain, nosebleeds, postnasal drip, rhinorrhea, sinus pressure, sneezing, sore throat and trouble swallowing.   Eyes: Negative for redness and itching.  Respiratory: Positive for cough and shortness of breath. Negative for chest tightness and wheezing.   Cardiovascular: Negative for palpitations and leg swelling.  Gastrointestinal: Negative for nausea and vomiting.  Genitourinary: Negative for dysuria.  Musculoskeletal: Positive for myalgias and arthralgias. Negative for joint swelling.  Skin: Negative for rash.  Neurological: Negative for headaches.  Hematological: Does not bruise/bleed easily.  Psychiatric/Behavioral: Negative for dysphoric mood. The patient is not nervous/anxious.    Past Medical History  Diagnosis Date  . HYPERLIPIDEMIA 12/17/2006  . HYPERTENSION 12/17/2006  . COPD 12/17/2006  . GERD 05/01/2007  . HYPERGLYCEMIA 08/27/2007  . Mood disorder (HCC)     hx anxiety and depression  . Arthritis     OA  . Rectal polyp 03/27/2007    adenoma  . Diverticulosis 03/27/2007  . Alcoholism in remission ([place]) 08/27/2007  . Anemia      Family History  Problem Relation Age of Onset  . Throat cancer Mother   . Heart attack Father 42  . Idiopathic pulmonary fibrosis Brother 30  . Cancer Brother     sarcoma  . Colon cancer Neg Hx      Social  History   Social History  . Marital Status: Married    Spouse Name: N/A  . Number of Children: 2  . Years of Education: N/A   Occupational History  . retired    Social History Main Topics  . Smoking status: Former Smoker -- 2.00 packs/day for 30 years    Types: Cigarettes    Quit date: 07/10/1984  . Smokeless tobacco: Never Used  . Alcohol Use: No     Comment: no alcoho since 1983  . Drug Use: No  . Sexual Activity: Not on file   Other Topics Concern    . Not on file   Social History Narrative   Work or School: very active in [place] Situation: lives with husband      Spiritual Beliefs: Christian      Lifestyle:tries to walk; diet ok - denies any alcohol or tobacco use in many many years           Allergies  Allergen Reactions  . Dilaudid [Hydromorphone Hcl] Other (See Comments)    Pt had hypotension after dilaudid '1mg'$  IV while in the OR, required temporary pressor intervention.     Outpatient Prescriptions Prior to Visit  Medication Sig Dispense Refill  . aspirin 81 MG tablet Take 81 mg by mouth daily.    . Calcium Carbonate-Vitamin D (CALTRATE 600+D) 600-400 MG-UNIT per tablet Take 1 tablet by mouth 2 (two) times daily.     . ferrous sulfate 325 (65 FE) MG tablet TAKE 1 TABLET BY MOUTH EVERY DAY 90 tablet 3  . fish oil-omega-3 fatty acids 1000 MG capsule Take 2 g by mouth daily.    Marland Kitchen KLOR-CON M20 20 MEQ tablet TAKE 1 TABLET BY MOUTH EVERY DAY 90 tablet 3  . lansoprazole (PREVACID) 15 MG capsule Take 1 capsule (15 mg total) by mouth daily. 90 capsule 3  . loratadine (CLARITIN) 10 MG tablet TAKE 1 TABLET BY MOUTH EVERY DAY 30 tablet 5  . losartan-hydrochlorothiazide (HYZAAR) 100-12.5 MG per tablet TAKE 1 TABLET BY MOUTH DAILY. 90 tablet 3  . Multiple Vitamin (MULTIVITAMIN WITH MINERALS) TABS tablet Take 1 tablet by mouth daily.    . simvastatin (ZOCOR) 20 MG tablet TAKE 1 TABLET BY MOUTH IN THE MORNING 90 tablet 1  . PROAIR HFA 108 (90 BASE) MCG/ACT inhaler INHALE 2 PUFFS EVERY 4 HOURS AS NEEDED FOR COUGHING SPELLS 8.5 Inhaler 0  . fluticasone (FLONASE) 50 MCG/ACT nasal spray Place 2 sprays into both nostrils daily. (Patient not taking: Reported on 08/13/2015) 16 g 5   No facility-administered medications prior to visit.         Objective:   Physical Exam Filed Vitals:   08/13/15 1638  BP: 112/78  Pulse: 68  Height: '5\' 6"'$  (1.676 m)  Weight: 204 lb (92.534 kg)  SpO2: 92%   Gen: Pleasant,  well-nourished, in no distress,  normal affect  ENT: No lesions,  mouth clear, erythematous post pharynx, no postnasal drip  Neck: No JVD, no TMG, no carotid bruits  Lungs: No use of accessory muscles, clear without rales or rhonchi  Cardiovascular: RRR, heart sounds normal, no murmur or gallops, no peripheral edema  Musculoskeletal: warm and well perfused  Neuro: alert, non focal  Skin: Warm, no lesions or rashes      Assessment & Plan:  Viral URI with cough URI symptoms for past 3-4 days. No fevers. No wheezes on exam. Probable viral URI at this time. Low  suspicion for acute bronchitis.  Continue supportive care for now. Instructed patient on signs/symptoms of bronchitis/COPD exacerbation  COPD (chronic obstructive pulmonary disease) Worsening dyspnea on exertion with chronic intermittent wheezing.  Start Spiriva daily and continue albuterol 2 puffs every 4 hours as needed Follow up in 2 months  GERD Had recurrence of her chronic cough off of Prevacid BID. Chronic cough now controlled.  Will have her continue her Prevacid BID  Jacques Earthly, MD  Internal Medicine PGY-2   Attending Note:  I have examined patient, reviewed labs, studies and notes. I have discussed the case with Dr Randell Patient, and I agree with the data and plans as amended above. Patient with COPD and acute upper respiratory infection. This does put her at increased risk for acute exacerbation but on evaluation today believe that she has been able to avoid this so far. We reviewed the signs and symptoms of an exacerbation with her. Also note that she's had more chronic dyspnea. We will try starting Spiriva to see if she benefits. Follow up in 2 months to assess her progress  Baltazar Apo, MD, PhD 10/26/2015, 9:19 AM [facility] [phone] or if no answer [phone]

## 2015-08-13 NOTE — Assessment & Plan Note (Signed)
URI symptoms for past 3-4 days. No fevers. No wheezes on exam. Probable viral URI at this time. Low suspicion for acute bronchitis.  Continue supportive care for now. Instructed patient on signs/symptoms of bronchitis/COPD exacerbation

## 2015-08-13 NOTE — Patient Instructions (Addendum)
   We have started you on Spiriva. Please take this daily.  Continue your albuterol inhaler 2 puffs up to every 4 hours as needed for shortness of breath, coughing spells, or wheezing  Continue your Prevacid and Claritin daily  If you have any of the problems listed below, please call our clinic:  GET HELP IF:  You cough up more thick spit than usual.  There is a change in the color or thickness of the spit.  It is harder to breathe than usual.  Your breathing is faster than usual. GET HELP RIGHT AWAY IF:  You have shortness of breath while resting.  You have shortness of breath that stops you from:  Being able to talk.  Doing normal activities.  You chest hurts for longer than 5 minutes.  Your skin color is more blue than usual.

## 2015-08-13 NOTE — Assessment & Plan Note (Signed)
Worsening dyspnea on exertion with chronic intermittent wheezing.  Start Spiriva daily and continue albuterol 2 puffs every 4 hours as needed Follow up in 2 months

## 2015-08-13 NOTE — Assessment & Plan Note (Addendum)
Had recurrence of her chronic cough off of Prevacid BID. Chronic cough now controlled.  Will have her continue her Prevacid BID

## 2015-08-23 ENCOUNTER — Telehealth: Payer: Self-pay | Admitting: Emergency Medicine

## 2015-08-23 NOTE — Telephone Encounter (Signed)
Attempted to contact pt. No answer, voicemail is not set up. Will try back.

## 2015-08-23 NOTE — Telephone Encounter (Signed)
ATC pt, NA, received VM but it has not been set up yet. WCB

## 2015-08-24 MED ORDER — AZITHROMYCIN 250 MG PO TABS: ORAL_TABLET | ORAL | Status: DC

## 2015-08-24 MED ORDER — METHYLPREDNISOLONE 4 MG PO TBPK: ORAL_TABLET | ORAL | Status: DC

## 2015-08-24 NOTE — Telephone Encounter (Signed)
Per SN: Call in [place] #1 Medrol dosepak - as directed

## 2015-08-24 NOTE — Telephone Encounter (Signed)
Spoke with pt, aware of recs.  rx's sent to preferred pharmacy.  Nothing further needed.  

## 2015-08-24 NOTE — Telephone Encounter (Signed)
Spoke with pt. States that she is not feeling any better since seeing RB on 08/13/15. Reports coughing, chest tightness and wheezing. Cough is producing green mucus at times. Denies SOB or fever. Would like recommendations on what to do from here.  Allergies  Allergen Reactions  . Dilaudid [Hydromorphone Hcl] Other (See Comments)    Pt had hypotension after dilaudid '1mg'$  IV while in the OR, required temporary pressor intervention.   SN - please advise. Thanks.

## 2015-08-31 ENCOUNTER — Other Ambulatory Visit: Payer: Self-pay | Admitting: *Deleted

## 2015-08-31 MED ORDER — LORATADINE 10 MG PO TABS: 10.0000 mg | ORAL_TABLET | Freq: Every day | ORAL | Status: DC

## 2015-09-03 ENCOUNTER — Other Ambulatory Visit: Payer: Self-pay | Admitting: Family

## 2015-09-20 ENCOUNTER — Other Ambulatory Visit: Payer: Self-pay | Admitting: *Deleted

## 2015-09-20 MED ORDER — SIMVASTATIN 20 MG PO TABS: 20.0000 mg | ORAL_TABLET | Freq: Every morning | ORAL | Status: DC

## 2015-09-20 MED ORDER — LOSARTAN POTASSIUM-HCTZ 100-12.5 MG PO TABS: 1.0000 | ORAL_TABLET | Freq: Every day | ORAL | Status: DC

## 2015-09-22 ENCOUNTER — Other Ambulatory Visit: Payer: Self-pay

## 2015-09-22 DIAGNOSIS — Z1231 Encounter for screening mammogram for malignant neoplasm of breast: Secondary | ICD-10-CM

## 2015-09-28 ENCOUNTER — Encounter: Payer: Self-pay | Admitting: Family Medicine

## 2015-09-28 ENCOUNTER — Ambulatory Visit (INDEPENDENT_AMBULATORY_CARE_PROVIDER_SITE_OTHER): Payer: Medicare HMO | Admitting: Family Medicine

## 2015-09-28 VITALS — BP 140/60 | HR 98 | Temp 97.6°F | Wt 204.0 lb

## 2015-09-28 DIAGNOSIS — J441 Chronic obstructive pulmonary disease with (acute) exacerbation: Secondary | ICD-10-CM | POA: Diagnosis not present

## 2015-09-28 MED ORDER — METHYLPREDNISOLONE 4 MG PO TBPK: ORAL_TABLET | ORAL | Status: DC

## 2015-09-28 NOTE — Patient Instructions (Signed)
Copd flare without clear bacterial involvement  Treat with course of prednisone again- if recurs after treatment follow up with Dr. Maudie Mercury or Dr. Lamonte Sakai  Use albuterol every 4-6 hours for 24 hours then as needed  If worsens- need to be seen sooner. Especially if start having productive cough with color change

## 2015-09-28 NOTE — Progress Notes (Signed)
Garret Reddish, MD  Subjective:  Sheila Shields is a 81 y.o. year old very pleasant female patient who presents for/with See problem oriented charting ROS- no chest pain. Mild shortness of breath improves with albuterol. No nausea, vomiting. No fever.   Past Medical History-  Patient Active Problem List   Diagnosis Date Noted  . Viral URI with cough 08/13/2015  . Open right ankle fracture 12/22/2014  . COPD (chronic obstructive pulmonary disease) ([place]) 11/18/2014  . Iron deficiency anemia, unspecified 01/20/2011  . ARTHRITIS 02/23/2010  . HYPERGLYCEMIA 08/27/2007  . GERD 05/01/2007  . Hyperlipemia 12/17/2006  . Essential hypertension 12/17/2006    Medications- reviewed and updated Current Outpatient Prescriptions  Medication Sig Dispense Refill  . aspirin 81 MG tablet Take 81 mg by mouth daily.    . Calcium Carbonate-Vitamin D (CALTRATE 600+D) 600-400 MG-UNIT per tablet Take 1 tablet by mouth 2 (two) times daily.     . ferrous sulfate 325 (65 FE) MG tablet TAKE 1 TABLET BY MOUTH EVERY DAY 90 tablet 3  . fish oil-omega-3 fatty acids 1000 MG capsule Take 2 g by mouth daily.    Marland Kitchen KLOR-CON M20 20 MEQ tablet TAKE 1 TABLET BY MOUTH EVERY DAY 90 tablet 3  . lansoprazole (PREVACID) 15 MG capsule Take 1 capsule (15 mg total) by mouth daily. 90 capsule 3  . loratadine (CLARITIN) 10 MG tablet Take 1 tablet (10 mg total) by mouth daily. 90 tablet 3  . losartan-hydrochlorothiazide (HYZAAR) 100-12.5 MG tablet Take 1 tablet by mouth daily. 90 tablet 1  . Multiple Vitamin (MULTIVITAMIN WITH MINERALS) TABS tablet Take 1 tablet by mouth daily.    . simvastatin (ZOCOR) 20 MG tablet Take 1 tablet (20 mg total) by mouth every morning. 90 tablet 1  . tiotropium (SPIRIVA HANDIHALER) 18 MCG inhalation capsule Place 1 capsule (18 mcg total) into inhaler and inhale daily. 30 capsule 6  . acetaminophen (TYLENOL) 500 MG tablet Take 1,500 mg by mouth 2 (two) times daily. Reported on 09/28/2015    . albuterol  (PROAIR HFA) 108 (90 Base) MCG/ACT inhaler INHALE 2 PUFFS EVERY 4 HOURS AS NEEDED FOR COUGHING SPELLS (Patient not taking: Reported on 09/28/2015) 8.5 Inhaler 2  . methylPREDNISolone (MEDROL DOSEPAK) 4 MG TBPK tablet Take as directed 21 tablet 0   No current facility-administered medications for this visit.    Objective: BP 140/60 mmHg  Pulse 98  Temp(Src) 97.6 F (36.4 C)  Wt 204 lb (92.534 kg)  SpO2 93% Gen: NAD, resting comfortably No sinus tenderness, oropharynx normal, TM normal.  CV: RRR no murmurs rubs or gallops Lungs: diffuse wheeze without rhonchi. No crackles.  Abdomen: soft/nontender/nondistended/normal bowel sounds. No rebound or guarding.  Ext: no edema Skin: warm, dry, no rash Neuro: grossly normal, moves all extremities  Assessment/Plan:  COPD exacerbation S: seen in early February by Dr. Lamonte Sakai thought likely URI but symptoms persisted and worsened and called in azithromycin and medrol dose pack. She improved at least 75% but for last  About 7-10 days has had worsening cough nonproductive. Minimal runny nose or sinus congestion Associated with some Wheeze. No sputum volume increase or color change. She does not feel level of fatigue she felt last time. Already on therapy for allergies with claritin, on spiriva for COPD with albuterol to be used prn (has had more frequent use, on prevacid for GERD and not on ace-i A/P: This appears to be COPD exacerbation though not clearly bacterial with no sputum increase or color change-  treat with medrol dose pack at this time with return precautions given  Meds ordered this encounter  Medications  . methylPREDNISolone (MEDROL DOSEPAK) 4 MG TBPK tablet    Sig: Take as directed    Dispense:  21 tablet    Refill:  0

## 2015-09-30 DIAGNOSIS — Z961 Presence of intraocular lens: Secondary | ICD-10-CM | POA: Diagnosis not present

## 2015-09-30 DIAGNOSIS — H16103 Unspecified superficial keratitis, bilateral: Secondary | ICD-10-CM | POA: Diagnosis not present

## 2015-09-30 DIAGNOSIS — H11153 Pinguecula, bilateral: Secondary | ICD-10-CM | POA: Diagnosis not present

## 2015-10-01 DIAGNOSIS — Z0101 Encounter for examination of eyes and vision with abnormal findings: Secondary | ICD-10-CM | POA: Diagnosis not present

## 2015-10-01 DIAGNOSIS — Z0100 Encounter for examination of eyes and vision without abnormal findings: Secondary | ICD-10-CM | POA: Diagnosis not present

## 2015-10-11 ENCOUNTER — Ambulatory Visit (INDEPENDENT_AMBULATORY_CARE_PROVIDER_SITE_OTHER): Payer: Medicare HMO | Admitting: Emergency Medicine

## 2015-10-11 ENCOUNTER — Encounter: Payer: Self-pay | Admitting: Emergency Medicine

## 2015-10-11 VITALS — BP 122/86 | HR 90 | Ht 66.0 in | Wt 203.0 lb

## 2015-10-11 DIAGNOSIS — R05 Cough: Secondary | ICD-10-CM | POA: Diagnosis not present

## 2015-10-11 DIAGNOSIS — J449 Chronic obstructive pulmonary disease, unspecified: Secondary | ICD-10-CM

## 2015-10-11 DIAGNOSIS — R053 Chronic cough: Secondary | ICD-10-CM

## 2015-10-11 NOTE — Patient Instructions (Addendum)
Please continue your loratadine '10mg'$  daily Start fluticasone nasal spray, 2 sprays each nostril daily Try using chlorpheniramine '4mg'$  up to every 4-6 hours if needed for cough or congestion. Try before bedtime.  Temporarily stop Spiriva. We will rechallenge you with this once your cough is improved.  Continue prevacid '15mg'$  twice a day  Follow with Dr Lamonte Sakai in 3 months or sooner if you have any problems.

## 2015-10-11 NOTE — Assessment & Plan Note (Signed)
With contributions of allergic rhinitis which is currently only partially treated, GERD which appears to be treated. Certainly must also consider contribution of her Spiriva which is a dry powder. At this time I would like to temporarily stop the Spiriva, treat her allergic rhinitis more aggressively. We will also continue her current Prevacid.   Please continue your loratadine '10mg'$  daily Start fluticasone nasal spray, 2 sprays each nostril daily Try using chlorpheniramine '4mg'$  up to every 4-6 hours if needed for cough or congestion. Try before bedtime.  Temporarily stop Spiriva. We will rechallenge you with this once your cough is improved.  Continue prevacid '15mg'$  twice a day  Follow with Dr Lamonte Sakai in 3 months or sooner if you have any problems.

## 2015-10-11 NOTE — Assessment & Plan Note (Signed)
COPD as confirmed by her spirometry and also based on symptoms. She did improve on Spiriva - less dyspnea, better exertional tolerance. As below who we will temporarily stop the Spiriva as a potential contributor to chronic cough. We may need to find an alternative if Spiriva can't be tolerated

## 2015-10-11 NOTE — Progress Notes (Signed)
Subjective:    Patient ID: Sheila Shields, female    DOB: 1935/05/14, 81 y.o.   MRN: [id number]  HPI 81 year old former smoker (60 pack years) with a history of hypertension, hyperlipidemia, GERD and COPD that was dx by PFT in 10/2013 > mild AFL. She is referred today for evaluation of chronic and persistent cough. Her breathing has been good, but she notes that ambulation for long distances is difficult due to back pain and also breathing.   I reviewed her pulmonary function testing from 10/09/13 in full which showed mild obstruction and an FEV1 of 86% of predicted.   Began to have cough in setting of URI in January, she was treated with abx x 2. On both occasions she felt better temporarily. She had a persistent cough that is now dry, has been happening since after the bronchitis. She is on prevacid since a month ago > seemed to help the cough some. She was temporarily on allergy meds OTC but stopped these. She believes that the albuterol helps her cough.   ROV 02/18/15 - follow-up visit for COPD and also chronic cough. I suspect she also has allergic rhinitis and some component of GERD.  Still on loratadine and prevacid bid.  She is not requiring ProAir. She is not having cough, wheeze. Her cough has resolved. She  Has not been needing albuterol but acknowledges that her activity level has been low since breaking her R leg  ROV 08/13/15 - follow up visit for COPD and chronic cough. She tried to stop her Prevacid and had recurrence of her symptoms. She has restarted her Prevacid and is still on Claritin daily. Her chronic cough is controlled. She has been having worsening dyspnea with exertion. She is dyspneic with normal activities. She has occasional wheezing. For the past 3-4 days, she has been having a cough productive of light green mucous, dyspnea and chest pressure. No worsening of her chronic wheezing or dyspnea with exertion. She also has rhinorrhea, sinus congestion and post nasal drip. No fevers  or chills. +sick contacts.   ROV 10/11/15 -- patient with a history of COPD and chronic cough. She has contributions from GERD, chronic rhinitis.  At her last visit we started her on Spiriva to see if she would benefit. She believes that she has benefited, that her exercise tolerance is better.  She is back on prevacid, but has started to have frequent cough again. She was treated with pred and azithro since last time and improved temporarily. Now she is coughing again. No overt GERD sx.    Review of Systems  Constitutional: Negative for fever and unexpected weight change.  HENT: Positive for dental problem. Negative for congestion, ear pain, nosebleeds, postnasal drip, rhinorrhea, sinus pressure, sneezing, sore throat and trouble swallowing.   Eyes: Negative for redness and itching.  Respiratory: Positive for cough and shortness of breath. Negative for chest tightness and wheezing.   Cardiovascular: Negative for palpitations and leg swelling.  Gastrointestinal: Negative for nausea and vomiting.  Genitourinary: Negative for dysuria.  Musculoskeletal: Positive for myalgias and arthralgias. Negative for joint swelling.  Skin: Negative for rash.  Neurological: Negative for headaches.  Hematological: Does not bruise/bleed easily.  Psychiatric/Behavioral: Negative for dysphoric mood. The patient is not nervous/anxious.    Past Medical History  Diagnosis Date  . HYPERLIPIDEMIA 12/17/2006  . HYPERTENSION 12/17/2006  . COPD 12/17/2006  . GERD 05/01/2007  . HYPERGLYCEMIA 08/27/2007  . Mood disorder (HCC)     hx anxiety and  depression  . Arthritis     OA  . Rectal polyp 03/27/2007    adenoma  . Diverticulosis 03/27/2007  . 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 Anemia      Family History  Problem Relation Age of Onset  . Throat cancer Mother   . Heart attack Father 74  . Idiopathic pulmonary fibrosis Brother 28  . Cancer Brother     sarcoma  . Colon cancer Neg Hx      Social History    Social History  . Marital Status: Married    Spouse Name: N/A  . Number of Children: 2  . Years of Education: N/A   Occupational History  . retired    Social History Main Topics  . Smoking status: Former Smoker -- 2.00 packs/day for 30 years    Types: Cigarettes    Quit date: 07/10/1984  . Smokeless tobacco: Never Used  . Alcohol Use: No     Comment: no alcoho since 1983  . Drug Use: No  . Sexual Activity: Not on file   Other Topics Concern  . Not on file   Social History Narrative   Work or School: very active in [place] Situation: lives with husband      Spiritual Beliefs: Christian      Lifestyle:tries to walk; diet ok - denies any alcohol or tobacco use in many many years           Allergies  Allergen Reactions  . Dilaudid [Hydromorphone Hcl] Other (See Comments)    Pt had hypotension after dilaudid '1mg'$  IV while in the OR, required temporary pressor intervention.     Outpatient Prescriptions Prior to Visit  Medication Sig Dispense Refill  . acetaminophen (TYLENOL) 500 MG tablet Take 1,500 mg by mouth 2 (two) times daily. Reported on 09/28/2015    . albuterol (PROAIR HFA) 108 (90 Base) MCG/ACT inhaler INHALE 2 PUFFS EVERY 4 HOURS AS NEEDED FOR COUGHING SPELLS 8.5 Inhaler 2  . aspirin 81 MG tablet Take 81 mg by mouth daily.    . Calcium Carbonate-Vitamin D (CALTRATE 600+D) 600-400 MG-UNIT per tablet Take 1 tablet by mouth 2 (two) times daily.     . ferrous sulfate 325 (65 FE) MG tablet TAKE 1 TABLET BY MOUTH EVERY DAY 90 tablet 3  . fish oil-omega-3 fatty acids 1000 MG capsule Take 2 g by mouth daily.    Marland Kitchen KLOR-CON M20 20 MEQ tablet TAKE 1 TABLET BY MOUTH EVERY DAY 90 tablet 3  . lansoprazole (PREVACID) 15 MG capsule Take 1 capsule (15 mg total) by mouth daily. 90 capsule 3  . loratadine (CLARITIN) 10 MG tablet Take 1 tablet (10 mg total) by mouth daily. 90 tablet 3  . losartan-hydrochlorothiazide (HYZAAR) 100-12.5 MG tablet Take 1 tablet by  mouth daily. 90 tablet 1  . methylPREDNISolone (MEDROL DOSEPAK) 4 MG TBPK tablet Take as directed 21 tablet 0  . Multiple Vitamin (MULTIVITAMIN WITH MINERALS) TABS tablet Take 1 tablet by mouth daily.    . simvastatin (ZOCOR) 20 MG tablet Take 1 tablet (20 mg total) by mouth every morning. 90 tablet 1  . tiotropium (SPIRIVA HANDIHALER) 18 MCG inhalation capsule Place 1 capsule (18 mcg total) into inhaler and inhale daily. 30 capsule 6   No facility-administered medications prior to visit.         Objective:   Physical Exam Filed Vitals:   10/11/15 1547 10/11/15 1548  BP:  122/86  Pulse:  90  Height: '5\' 6"'$  (1.676 m)   Weight: 203 lb (92.08 kg)   SpO2:  92%   Gen: Pleasant, well-nourished, in no distress,  normal affect  ENT: No lesions,  mouth clear, erythematous post pharynx, no postnasal drip  Neck: No JVD, very soft stridor on insp  Lungs: No use of accessory muscles, clear without rales or rhonchi, some referred UA noise  Cardiovascular: RRR, heart sounds normal, no murmur or gallops, no peripheral edema  Musculoskeletal: warm and well perfused  Neuro: alert, non focal  Skin: Warm, no lesions or rashes      Assessment & Plan:  Chronic cough With contributions of allergic rhinitis which is currently only partially treated, GERD which appears to be treated. Certainly must also consider contribution of her Spiriva which is a dry powder. At this time I would like to temporarily stop the Spiriva, treat her allergic rhinitis more aggressively. We will also continue her current Prevacid.   Please continue your loratadine '10mg'$  daily Start fluticasone nasal spray, 2 sprays each nostril daily Try using chlorpheniramine '4mg'$  up to every 4-6 hours if needed for cough or congestion. Try before bedtime.  Temporarily stop Spiriva. We will rechallenge you with this once your cough is improved.  Continue prevacid '15mg'$  twice a day  Follow with Dr Lamonte Sakai in 3 months or sooner if you  have any problems.  COPD (chronic obstructive pulmonary disease) COPD as confirmed by her spirometry and also based on symptoms. She did improve on Spiriva - less dyspnea, better exertional tolerance. As below who we will temporarily stop the Spiriva as a potential contributor to chronic cough. We may need to find an alternative if Spiriva can't be tolerated

## 2015-10-19 ENCOUNTER — Encounter: Payer: Self-pay | Admitting: Family Medicine

## 2015-10-19 ENCOUNTER — Ambulatory Visit (INDEPENDENT_AMBULATORY_CARE_PROVIDER_SITE_OTHER): Payer: Medicare HMO | Admitting: Family Medicine

## 2015-10-19 VITALS — BP 126/78 | HR 101 | Temp 97.9°F | Ht 66.0 in | Wt 203.3 lb

## 2015-10-19 DIAGNOSIS — E785 Hyperlipidemia, unspecified: Secondary | ICD-10-CM

## 2015-10-19 DIAGNOSIS — J449 Chronic obstructive pulmonary disease, unspecified: Secondary | ICD-10-CM

## 2015-10-19 DIAGNOSIS — K219 Gastro-esophageal reflux disease without esophagitis: Secondary | ICD-10-CM | POA: Diagnosis not present

## 2015-10-19 DIAGNOSIS — I10 Essential (primary) hypertension: Secondary | ICD-10-CM

## 2015-10-19 DIAGNOSIS — D509 Iron deficiency anemia, unspecified: Secondary | ICD-10-CM

## 2015-10-19 DIAGNOSIS — R05 Cough: Secondary | ICD-10-CM

## 2015-10-19 DIAGNOSIS — R053 Chronic cough: Secondary | ICD-10-CM

## 2015-10-19 NOTE — Progress Notes (Signed)
Pre visit review using our clinic review tool, if applicable. No additional management support is needed unless otherwise documented below in the visit note. 

## 2015-10-19 NOTE — Progress Notes (Signed)
HPI:  Sheila Shields is a pleasant 81 yo here for follow up. She reports she is doing quite well and is excited that family is coming in for easter weekend.  HTN: -meds: losartan-hctz 100-12.5 -denies: CP, HA, increased swelling -has chronic LE edema -stable  HLD: -meds: simvastatin, fish oil, asa -stable  COPD/Allergies: -sees Dr. Lamonte Sakai in pulmonology -meds: pro air prn, flonase, claritin -On review of chart, appears has had several flares in the last few months with recent pulm OV and changes to medications - spiriva held due to cough -Reports is doing better, has persistent cough but this is improving; denies shortness of breath, orthopnea, dyspnea on exertion  GERD: -meds: prevacid  Anemia: -takes iron, CBC and 06/2015 normal -reports prior PCP said she had anemia and advised daily iron -denies GI bleeding, hx GI bleed or ulcer, melena, hematochezia  OA/hx R ankle fx/Chronic back pain: -takes meloxicam bid -sees Dr. Maxie Better  Insomnia: -mild, once per week has difficulty getting to sleep -denies current or recent depression or anxiety -no alcohol use in a long time  ROS: See pertinent positives and negatives per HPI.  Past Medical History  Diagnosis Date  . HYPERLIPIDEMIA 12/17/2006  . HYPERTENSION 12/17/2006  . COPD 12/17/2006  . GERD 05/01/2007  . HYPERGLYCEMIA 08/27/2007  . Mood disorder (HCC)     hx anxiety and depression  . Arthritis     OA  . Rectal polyp 03/27/2007    adenoma  . Diverticulosis 03/27/2007  . Alcoholism in remission ([place]) 08/27/2007  . Anemia     Past Surgical History  Procedure Laterality Date  . Breast biopsy      negative  . Hammer toe surgery  2008    left foot  . Colonoscopy w/ polypectomy  03/27/2007; n7/19/2012    2008: 4 mm adenoma, diverticulosis 2012: ileitis ? NSAID - likely, 2-3 mm cecal polyp LYMPHOID FOLLICLE, diverticulosis  . Esophagogastroduodenoscopy  01/26/2011    reflux esophagitis, colonscopy done also  . Appendectomy   2002  . Abdominal hysterectomy  1978    fibroma  . Carpal tunnel release Left 2010 or 2011  . Shoulder open rotator cuff repair Left 03/06/2013    Procedure: LEFT ROTATOR CUFF REPAIR, SUBACROMIAL DECOMPRESSION, PATCH GRAFT, MANIPULATION UNDER ANESTHESIA;  Surgeon: Johnn Hai, MD;  Location: [facility];  Service: Orthopedics;  Laterality: Left;  . Orif ankle fracture Right 12/22/2014    Procedure: OPEN REDUCTION INTERNAL FIXATION (ORIF) ANKLE FRACTURE;  Surgeon: Susa Day, MD;  Location: [facility];  Service: Orthopedics;  Laterality: Right;    Family History  Problem Relation Age of Onset  . Throat cancer Mother   . Heart attack Father 57  . Idiopathic pulmonary fibrosis Brother 54  . Cancer Brother     sarcoma  . Colon cancer Neg Hx     Social History   Social History  . Marital Status: Married    Spouse Name: N/A  . Number of Children: 2  . Years of Education: N/A   Occupational History  . retired    Social History Main Topics  . Smoking status: Former Smoker -- 2.00 packs/day for 30 years    Types: Cigarettes    Quit date: 07/10/1984  . Smokeless tobacco: Never Used  . Alcohol Use: No     Comment: no alcoho since 1983  . Drug Use: No  . Sexual Activity: Not Asked   Other Topics Concern  . None   Social History Narrative  Work or School: very active in [place] Situation: lives with husband      Spiritual Beliefs: Christian      Lifestyle:tries to walk; diet ok - denies any alcohol or tobacco use in many many years           Current outpatient prescriptions:  .  acetaminophen (TYLENOL) 500 MG tablet, Take 1,500 mg by mouth 2 (two) times daily. Reported on 09/28/2015, Disp: , Rfl:  .  albuterol (PROAIR HFA) 108 (90 Base) MCG/ACT inhaler, INHALE 2 PUFFS EVERY 4 HOURS AS NEEDED FOR COUGHING SPELLS, Disp: 8.5 Inhaler, Rfl: 2 .  aspirin 81 MG tablet, Take 81 mg by mouth daily., Disp: , Rfl:  .  Calcium Carbonate-Vitamin D (CALTRATE 600+D)  600-400 MG-UNIT per tablet, Take 1 tablet by mouth 2 (two) times daily. , Disp: , Rfl:  .  ferrous sulfate 325 (65 FE) MG tablet, TAKE 1 TABLET BY MOUTH EVERY DAY, Disp: 90 tablet, Rfl: 3 .  fish oil-omega-3 fatty acids 1000 MG capsule, Take 2 g by mouth daily., Disp: , Rfl:  .  KLOR-CON M20 20 MEQ tablet, TAKE 1 TABLET BY MOUTH EVERY DAY, Disp: 90 tablet, Rfl: 3 .  lansoprazole (PREVACID) 15 MG capsule, Take 1 capsule (15 mg total) by mouth daily., Disp: 90 capsule, Rfl: 3 .  loratadine (CLARITIN) 10 MG tablet, Take 1 tablet (10 mg total) by mouth daily., Disp: 90 tablet, Rfl: 3 .  losartan-hydrochlorothiazide (HYZAAR) 100-12.5 MG tablet, Take 1 tablet by mouth daily., Disp: 90 tablet, Rfl: 1 .  Multiple Vitamin (MULTIVITAMIN WITH MINERALS) TABS tablet, Take 1 tablet by mouth daily., Disp: , Rfl:  .  simvastatin (ZOCOR) 20 MG tablet, Take 1 tablet (20 mg total) by mouth every morning., Disp: 90 tablet, Rfl: 1  EXAM:  Filed Vitals:   10/19/15 1106  BP: 126/78  Pulse: 101  Temp: 97.9 F (36.6 C)    Body mass index is 32.83 kg/(m^2).  GENERAL: vitals reviewed and listed above, alert, oriented, appears well hydrated and in no acute distress  HEENT: atraumatic, conjunttiva clear, no obvious abnormalities on inspection of external nose and ears  NECK: no obvious masses on inspection  LUNGS: Prolonged expiratory phase, scattered expiratory wheeze  CV: HRRR, trace bilateral ankle edema  MS: moves all extremities without noticeable abnormality  PSYCH: pleasant and cooperative, no obvious depression or anxiety  ASSESSMENT AND PLAN:  Discussed the following assessment and plan:  Essential hypertension  Hyperlipemia  Chronic obstructive pulmonary disease, unspecified COPD type (HCC)  Gastroesophageal reflux disease without esophagitis  Iron deficiency anemia, unspecified  Chronic cough  -Medications reviewed and updated medication list -Lifestyle recommendations -On a  cough, she feels he is doing well currently and plans to follow up with pulmonologist if worsening or concerns -Advised to schedule Medicare wellness visit in 3 months, we'll plan to do/order labs at that visit -Patient advised to return or notify a doctor immediately if symptoms worsen or persist or new concerns arise.  Patient Instructions  BEFORE YOU LEAVE: -Medicare Wellness Exam in 3-4 months  We recommend the following healthy lifestyle measures: - eat a healthy whole foods diet consisting of regular small meals composed of vegetables, fruits, beans, nuts, seeds, healthy meats such as white chicken and fish and whole grains.  - avoid sweets, white starchy foods, fried foods, fast food, processed foods, sodas, red meet and other fattening foods.  - get a least 150-300 minutes of aerobic  exercise per week.       Colin Benton R.

## 2015-10-19 NOTE — Patient Instructions (Signed)
BEFORE YOU LEAVE: -Medicare Wellness Exam in 3-4 months  We recommend the following healthy lifestyle measures: - eat a healthy whole foods diet consisting of regular small meals composed of vegetables, fruits, beans, nuts, seeds, healthy meats such as white chicken and fish and whole grains.  - avoid sweets, white starchy foods, fried foods, fast food, processed foods, sodas, red meet and other fattening foods.  - get a least 150-300 minutes of aerobic exercise per week.

## 2015-10-25 ENCOUNTER — Ambulatory Visit
Admission: RE | Admit: 2015-10-25 | Discharge: 2015-10-25 | Disposition: A | Discharge status: Home / Self Care | Payer: Medicare HMO | Source: Ambulatory Visit

## 2015-10-25 DIAGNOSIS — Z1231 Encounter for screening mammogram for malignant neoplasm of breast: Secondary | ICD-10-CM

## 2015-11-16 DIAGNOSIS — M545 Low back pain: Secondary | ICD-10-CM | POA: Diagnosis not present

## 2015-11-26 DIAGNOSIS — Z85820 Personal history of malignant melanoma of skin: Secondary | ICD-10-CM | POA: Diagnosis not present

## 2015-11-26 DIAGNOSIS — D1723 Benign lipomatous neoplasm of skin and subcutaneous tissue of right leg: Secondary | ICD-10-CM | POA: Diagnosis not present

## 2015-11-26 DIAGNOSIS — L720 Epidermal cyst: Secondary | ICD-10-CM | POA: Diagnosis not present

## 2015-11-26 DIAGNOSIS — D692 Other nonthrombocytopenic purpura: Secondary | ICD-10-CM | POA: Diagnosis not present

## 2015-11-26 DIAGNOSIS — L438 Other lichen planus: Secondary | ICD-10-CM | POA: Diagnosis not present

## 2015-11-26 DIAGNOSIS — L812 Freckles: Secondary | ICD-10-CM | POA: Diagnosis not present

## 2015-11-26 DIAGNOSIS — D1801 Hemangioma of skin and subcutaneous tissue: Secondary | ICD-10-CM | POA: Diagnosis not present

## 2015-11-26 DIAGNOSIS — L821 Other seborrheic keratosis: Secondary | ICD-10-CM | POA: Diagnosis not present

## 2015-11-30 DIAGNOSIS — Z8781 Personal history of (healed) traumatic fracture: Secondary | ICD-10-CM | POA: Diagnosis not present

## 2015-11-30 DIAGNOSIS — Z967 Presence of other bone and tendon implants: Secondary | ICD-10-CM | POA: Diagnosis not present

## 2015-11-30 DIAGNOSIS — Z9889 Other specified postprocedural states: Secondary | ICD-10-CM | POA: Diagnosis not present

## 2015-11-30 DIAGNOSIS — M545 Low back pain: Secondary | ICD-10-CM | POA: Diagnosis not present

## 2016-01-20 ENCOUNTER — Encounter: Payer: Self-pay | Admitting: Family Medicine

## 2016-01-20 ENCOUNTER — Ambulatory Visit (INDEPENDENT_AMBULATORY_CARE_PROVIDER_SITE_OTHER): Payer: Medicare HMO | Admitting: Family Medicine

## 2016-01-20 VITALS — BP 120/62 | HR 74 | Temp 97.6°F | Ht 65.0 in | Wt 202.5 lb

## 2016-01-20 DIAGNOSIS — M129 Arthropathy, unspecified: Secondary | ICD-10-CM | POA: Diagnosis not present

## 2016-01-20 DIAGNOSIS — K219 Gastro-esophageal reflux disease without esophagitis: Secondary | ICD-10-CM | POA: Diagnosis not present

## 2016-01-20 DIAGNOSIS — J449 Chronic obstructive pulmonary disease, unspecified: Secondary | ICD-10-CM

## 2016-01-20 DIAGNOSIS — E785 Hyperlipidemia, unspecified: Secondary | ICD-10-CM | POA: Diagnosis not present

## 2016-01-20 DIAGNOSIS — I10 Essential (primary) hypertension: Secondary | ICD-10-CM

## 2016-01-20 DIAGNOSIS — Z6833 Body mass index (BMI) 33.0-33.9, adult: Secondary | ICD-10-CM

## 2016-01-20 DIAGNOSIS — Z0000 Encounter for general adult medical examination without abnormal findings: Secondary | ICD-10-CM | POA: Diagnosis not present

## 2016-01-20 LAB — BASIC METABOLIC PANEL
BUN: 15 mg/dL (ref 6–23)
CALCIUM: 10.3 mg/dL (ref 8.4–10.5)
CO2: 34 mEq/L — ABNORMAL HIGH (ref 19–32)
CREATININE: 0.8 mg/dL (ref 0.40–1.20)
Chloride: 102 mEq/L (ref 96–112)
GFR: 73.55 mL/min (ref >60.00)
GLUCOSE: 107 mg/dL — AB (ref 70–99)
Potassium: 4.3 mEq/L (ref 3.5–5.1)
SODIUM: 143 meq/L (ref 135–145)

## 2016-01-20 LAB — CBC WITH DIFFERENTIAL/PLATELET
BASOS ABS: 0 10*3/uL (ref 0.0–0.1)
Basophils Relative: 0.7 % (ref 0.0–3.0)
EOS ABS: 0.5 10*3/uL (ref 0.0–0.7)
Eosinophils Relative: 6.7 % — ABNORMAL HIGH (ref 0.0–5.0)
HCT: 43.8 % (ref 36.0–46.0)
Hemoglobin: 14.6 g/dL (ref 12.0–15.0)
LYMPHS ABS: 1.6 10*3/uL (ref 0.7–4.0)
Lymphocytes Relative: 22.7 % (ref 12.0–46.0)
MCHC: 33.2 g/dL (ref 30.0–36.0)
MCV: 92.2 fl (ref 78.0–100.0)
MONO ABS: 0.9 10*3/uL (ref 0.1–1.0)
MONOS PCT: 12.4 % — AB (ref 3.0–12.0)
NEUTROS PCT: 57.5 % (ref 43.0–77.0)
Neutro Abs: 4 10*3/uL (ref 1.4–7.7)
PLATELETS: 285 10*3/uL (ref 150.0–400.0)
RBC: 4.75 Mil/uL (ref 3.87–5.11)
RDW: 13.9 % (ref 11.5–15.5)
WBC: 7 10*3/uL (ref 4.0–10.5)

## 2016-01-20 LAB — LIPID PANEL
CHOLESTEROL: 198 mg/dL (ref 0–200)
HDL: 48 mg/dL (ref >39.00)
LDL Cholesterol: 111 mg/dL — ABNORMAL HIGH (ref 0–99)
NonHDL: 149.83
Total CHOL/HDL Ratio: 4
Triglycerides: 192 mg/dL — ABNORMAL HIGH (ref 0.0–149.0)
VLDL: 38.4 mg/dL (ref 0.0–40.0)

## 2016-01-20 LAB — VITAMIN D 25 HYDROXY (VIT D DEFICIENCY, FRACTURES): VITD: 31.1 ng/mL (ref 30.00–100.00)

## 2016-01-20 LAB — VITAMIN B12: Vitamin B-12: 402 pg/mL (ref 211–911)

## 2016-01-20 NOTE — Progress Notes (Signed)
Pre visit review using our clinic review tool, if applicable. No additional management support is needed unless otherwise documented below in the visit note. 

## 2016-01-20 NOTE — Progress Notes (Signed)
Medicare Annual Preventive Care Visit  (initial annual wellness or annual wellness exam)  Concerns and/or follow up today:  Sheila Shields is a pleasant 81 yo here for follow up and her medicare exam.  She sees gyn for women's health.  HTN: -meds: losartan-hctz 100-12.5 -denies: CP, HA, increased swelling -has chronic LE edema -stable  HLD: -meds: simvastatin, fish oil, asa -stable  COPD/Allergies: -sees Dr. Lamonte Sakai in pulmonology -meds: pro air prn, flonase, claritin  GERD: -meds: prevacid  Anemia: -takes iron, CBC 06/2015 normal -reports prior PCP said she had anemia and advised daily iron -denies GI bleeding, hx GI bleed or ulcer, melena, hematochezia  OA/hx R ankle fx/Chronic back pain: -takes tylenol twice daily and feels it really make pain tolerable -sees Dr. Maxie Better and Dr. Nelva Bush - back inj really help -unfortunately still is in pain many days - especially if does not take tylenol or aleve - these both make pain tolerable  Insomnia: -mild, once per week has difficulty getting to sleep   ROS: negative for report of fevers, unintentional weight loss, vision changes, vision loss, hearing loss or change, chest pain, sob, hemoptysis, melena, hematochezia, hematuria, genital discharge or lesions, falls, bleeding or bruising, loc, thoughts of suicide or self harm, memory loss  1.) Patient-completed health risk assessment  - completed and reviewed, see scanned documentation  2.) Review of Medical History: -PMH, PSH, Family History and current specialty and care providers reviewed and updated and listed below  - see scanned in document in chart and below  Past Medical History  Diagnosis Date  . HYPERLIPIDEMIA 12/17/2006  . HYPERTENSION 12/17/2006  . COPD 12/17/2006  . GERD 05/01/2007  . HYPERGLYCEMIA 08/27/2007  . Mood disorder (HCC)     hx anxiety and depression  . Arthritis     OA  . Rectal polyp 03/27/2007    adenoma  . Diverticulosis 03/27/2007  . Alcoholism in  remission ([place]) 08/27/2007  . Anemia     Past Surgical History  Procedure Laterality Date  . Breast biopsy      negative  . Hammer toe surgery  2008    left foot  . Colonoscopy w/ polypectomy  03/27/2007; n7/19/2012    2008: 4 mm adenoma, diverticulosis 2012: ileitis ? NSAID - likely, 2-3 mm cecal polyp LYMPHOID FOLLICLE, diverticulosis  . Esophagogastroduodenoscopy  01/26/2011    reflux esophagitis, colonscopy done also  . Appendectomy  2002  . Abdominal hysterectomy  1978    fibroma  . Carpal tunnel release Left 2010 or 2011  . Shoulder open rotator cuff repair Left 03/06/2013    Procedure: LEFT ROTATOR CUFF REPAIR, SUBACROMIAL DECOMPRESSION, PATCH GRAFT, MANIPULATION UNDER ANESTHESIA;  Surgeon: Johnn Hai, MD;  Location: [facility];  Service: Orthopedics;  Laterality: Left;  . Orif ankle fracture Right 12/22/2014    Procedure: OPEN REDUCTION INTERNAL FIXATION (ORIF) ANKLE FRACTURE;  Surgeon: Susa Day, MD;  Location: [facility];  Service: Orthopedics;  Laterality: Right;    Social History   Social History  . Marital Status: Married    Spouse Name: N/A  . Number of Children: 2  . Years of Education: N/A   Occupational History  . retired    Social History Main Topics  . Smoking status: Former Smoker -- 2.00 packs/day for 30 years    Types: Cigarettes    Quit date: 07/10/1984  . Smokeless tobacco: Never Used  . Alcohol Use: No     Comment: no alcoho since 1983  . Drug Use: No  .  Sexual Activity: Not on file   Other Topics Concern  . Not on file   Social History Narrative   Work or School: very active in [place] Situation: lives with husband      Spiritual Beliefs: Christian      Lifestyle:tries to walk; diet ok - denies any alcohol or tobacco use in many many years          Family History  Problem Relation Age of Onset  . Throat cancer Mother   . Heart attack Father 68  . Idiopathic pulmonary fibrosis Brother 53  . Cancer Brother      sarcoma  . Colon cancer Neg Hx     Current Outpatient Prescriptions on File Prior to Visit  Medication Sig Dispense Refill  . acetaminophen (TYLENOL) 500 MG tablet Take 1,500 mg by mouth 2 (two) times daily. Reported on 09/28/2015    . albuterol (PROAIR HFA) 108 (90 Base) MCG/ACT inhaler INHALE 2 PUFFS EVERY 4 HOURS AS NEEDED FOR COUGHING SPELLS 8.5 Inhaler 2  . aspirin 81 MG tablet Take 81 mg by mouth daily.    . Calcium Carbonate-Vitamin D (CALTRATE 600+D) 600-400 MG-UNIT per tablet Take 1 tablet by mouth 2 (two) times daily.     . ferrous sulfate 325 (65 FE) MG tablet TAKE 1 TABLET BY MOUTH EVERY DAY 90 tablet 3  . fish oil-omega-3 fatty acids 1000 MG capsule Take 2 g by mouth daily.    Marland Kitchen KLOR-CON M20 20 MEQ tablet TAKE 1 TABLET BY MOUTH EVERY DAY 90 tablet 3  . lansoprazole (PREVACID) 15 MG capsule Take 1 capsule (15 mg total) by mouth daily. 90 capsule 3  . loratadine (CLARITIN) 10 MG tablet Take 1 tablet (10 mg total) by mouth daily. 90 tablet 3  . losartan-hydrochlorothiazide (HYZAAR) 100-12.5 MG tablet Take 1 tablet by mouth daily. 90 tablet 1  . Multiple Vitamin (MULTIVITAMIN WITH MINERALS) TABS tablet Take 1 tablet by mouth daily.    . simvastatin (ZOCOR) 20 MG tablet Take 1 tablet (20 mg total) by mouth every morning. 90 tablet 1   No current facility-administered medications on file prior to visit.     3.) Review of functional ability and level of safety:  Any difficulty hearing?  NO  History of falling?  NO  Any trouble with IADLs - using a phone, using transportation, grocery shopping, preparing meals, doing housework, doing laundry, taking medications and managing money? NO  Advance Directives? yes  See summary of recommendations in Patient Instructions below.  4.) Physical Exam Filed Vitals:   01/20/16 0902  BP: 120/62  Pulse: 74  Temp: 97.6 F (36.4 C)   Estimated body mass index is 33.7 kg/(m^2) as calculated from the following:   Height as of this  encounter: '5\' 5"'$  (1.651 m).   Weight as of this encounter: 202 lb 8 oz (91.853 kg). Body mass index is 33.7 kg/(m^2).  EKG (optional): deferred  General: alert, appear well hydrated and in no acute distress  HEENT: visual acuity grossly intact  CV: HRRR, tr bilat anke edema  Lungs: CTA bilaterally  Psych: pleasant and cooperative, no obvious depression or anxiety  Cognitive function grossly intact  See patient instructions for recommendations.  Education and counseling regarding the above review of health provided with a plan for the following: -see scanned patient completed form for further details -fall prevention strategies discussed  -healthy lifestyle discussed -importance and resources for completing advanced directives discussed -  see patient instructions below for any other recommendations provided  4)The following written screening schedule of preventive measures were reviewed with assessment and plan made per below, orders and patient instructions:      AAA screening done if applicable     Alcohol screening done     Obesity Screening and counseling done     STI screening (Hep C if born [birth year]) offered and per pt wishes     Tobacco Screening done        Pneumococcal (PPSV23 -one dose after 64, one before if risk factors), influenza yearly and hepatitis B vaccines (if high risk - end stage renal disease, IV drugs, homosexual men, live in home for mentally retarded, hemophilia receiving factors) ASSESSMENT/PLAN: done      Screening mammograph (yearly if >40) ASSESSMENT/PLAN: utd - sees gyn per her report      Screening Pap smear/pelvic exam (q2 years) ASSESSMENT/PLAN: n/a, declined sees gyn per her report      Colorectal cancer screening (FOBT yearly or flex sig q4y or colonoscopy q10y or barium enema q4y) ASSESSMENT/PLAN: utd or ordered      Diabetes outpatient self-management training services ASSESSMENT/PLAN: utd or done      Bone mass measurements(covered  q2y if indicated - estrogen def, osteoporosis, hyperparathyroid, vertebral abnormalities, osteoporosis or steroids) ASSESSMENT/PLAN: utd or discussed and ordered per pt wishes      Screening for glaucoma(q1y if high risk - diabetes, FH, AA and > 50 or hispanic and > 65) ASSESSMENT/PLAN: utd or advised      Medical nutritional therapy for individuals with diabetes or renal disease ASSESSMENT/PLAN: see orders      Cardiovascular screening blood tests (lipids q5y) ASSESSMENT/PLAN: see orders and labs      Diabetes screening tests ASSESSMENT/PLAN: see orders and labs   7.) Summary:   Medicare annual wellness visit, subsequent -risk factors and conditions per above assessment were discussed and treatment, recommendations and referrals were offered per documentation above and orders and patient instructions.  Essential hypertension - Plan: Basic metabolic panel, CBC with Differential/Platelets Hyperlipemia - Plan: Lipid Panel BMI 33.0-33.9,adult -labs -cont medication/s -lifestyle recs  Gastroesophageal reflux disease without esophagitis -cont current tx  Arthropathy - Plan: VITAMIN D 25 Hydroxy (Vit-D Deficiency, Fractures), Vitamin B12 -discussed no perfect answer for chronic pain and treatment options and risks -she suprisingly get fair response with tylenol/prn aleve  -encouraged increased exercise, topical tx, will check some labs  Chronic obstructive pulmonary disease, unspecified COPD type ([place]) -sees specialist, stable per her report  Patient Instructions  BEFORE YOU LEAVE: -follow up: 4-6 months and as needed -labs  Stop calcium.  Vit D3 1000 IU  I am sorry that your arthritis causes you so much discomfort. Can try topical sports creams with capsaicin, tumeric, and tylenol and aleve as needed is safe dosing amounts. Do try to use the aleve and tylenol a little as possible but as needed to improve function. Keep moving! Follow up with your specialists as  needed.   We recommend the following healthy lifestyle: 1) Small portions - eat off of salad plate instead of dinner plate 2) Eat a healthy clean diet with avoidance of (less then 1 serving per week) processed foods, sweetened drinks, white starches, red meat, fast foods and sweets and consisting of: * 5-9 servings per day of fresh or frozen fruits and vegetables (not corn or potatoes, not dried or canned) *nuts and seeds, beans *olives and olive oil *small portions of lean meats such as  fish and white chicken  *small portions of whole grains 3)Get at least 150 minutes of sweaty aerobic exercise per week 4)reduce stress - counseling, meditation, relaxation to balance other aspects of your life  We have ordered labs or studies at this visit. It can take up to 1-2 weeks for results and processing. IF results require follow up or explanation, we will call you with instructions. Clinically stable results will be released to your [facility]. If you have not heard from [place] or cannot find your results in [facility] in 2 weeks please contact our office at [phone].  If you are not yet signed up for [facility], please consider signing up.            Colin Benton R., DO

## 2016-01-20 NOTE — Patient Instructions (Addendum)
BEFORE YOU LEAVE: -follow up: 4-6 months and as needed -labs  Stop calcium.  Vit D3 1000 IU daily  I am sorry that your arthritis causes you so much discomfort. Can try topical sports creams with capsaicin, tumeric, and tylenol and aleve as needed is safe dosing amounts. Do try to use the aleve and tylenol a little as possible but as needed to improve function. Keep moving! Follow up with your specialists as needed.   We recommend the following healthy lifestyle: 1) Small portions - eat off of salad plate instead of dinner plate 2) Eat a healthy clean diet with avoidance of (less then 1 serving per week) processed foods, sweetened drinks, white starches, red meat, fast foods and sweets and consisting of: * 5-9 servings per day of fresh or frozen fruits and vegetables (not corn or potatoes, not dried or canned) *nuts and seeds, beans *olives and olive oil *small portions of lean meats such as fish and white chicken  *small portions of whole grains 3)Get at least 150 minutes of sweaty aerobic exercise per week 4)reduce stress - counseling, meditation, relaxation to balance other aspects of your life  We have ordered labs or studies at this visit. It can take up to 1-2 weeks for results and processing. IF results require follow up or explanation, we will call you with instructions. Clinically stable results will be released to your [facility]. If you have not heard from [place] or cannot find your results in [facility] in 2 weeks please contact our office at [phone].  If you are not yet signed up for [facility], please consider signing up.

## 2016-01-24 ENCOUNTER — Encounter: Payer: Self-pay | Admitting: Emergency Medicine

## 2016-01-24 ENCOUNTER — Ambulatory Visit (INDEPENDENT_AMBULATORY_CARE_PROVIDER_SITE_OTHER): Payer: Medicare HMO | Admitting: Emergency Medicine

## 2016-01-24 VITALS — BP 128/74 | HR 80 | Ht 65.0 in | Wt 203.0 lb

## 2016-01-24 DIAGNOSIS — K219 Gastro-esophageal reflux disease without esophagitis: Secondary | ICD-10-CM

## 2016-01-24 DIAGNOSIS — R053 Chronic cough: Secondary | ICD-10-CM

## 2016-01-24 DIAGNOSIS — J310 Chronic rhinitis: Secondary | ICD-10-CM

## 2016-01-24 DIAGNOSIS — R05 Cough: Secondary | ICD-10-CM | POA: Diagnosis not present

## 2016-01-24 DIAGNOSIS — J449 Chronic obstructive pulmonary disease, unspecified: Secondary | ICD-10-CM | POA: Diagnosis not present

## 2016-01-24 NOTE — Assessment & Plan Note (Signed)
Appears to have benefited from the discontinuation of Spiriva. We will not restart at this time. Continue her GERD and rhinitis therapy.

## 2016-01-24 NOTE — Assessment & Plan Note (Signed)
Chlorpheniramine, loratadine. She will start using fluticasone nasal spray during the allergy seasons

## 2016-01-24 NOTE — Assessment & Plan Note (Signed)
Prevacid as ordered

## 2016-01-24 NOTE — Assessment & Plan Note (Signed)
Mild disease. Appears to be chronically stable after discontinuation of Spiriva. She will continue her treatment for GERD, rhinitis, and use albuterol when necessary.

## 2016-01-24 NOTE — Patient Instructions (Addendum)
Please continue your loratadine, chlorpheniramine, prevacid Keep albuterol available to use 2 puffs up to every 4 hours if needed for shortness of breath.  We will not restart spiriva for now.  Keep working on exercise Consider restarting fluticasone nasal spray 2 sprays every day in the Fall and Spring months.  Follow with Dr Lamonte Sakai in 12 months or sooner if you have any problems

## 2016-01-24 NOTE — Progress Notes (Signed)
Subjective:    Patient ID: Sheila Shields, female    DOB: Nov 13, 1934, 81 y.o.   MRN: [id number]  HPI 81 year old former smoker (60 pack years) with a history of hypertension, hyperlipidemia, GERD and COPD that was dx by PFT in 10/2013 > mild AFL. She is referred today for evaluation of chronic and persistent cough. Her breathing has been good, but she notes that ambulation for long distances is difficult due to back pain and also breathing.   I reviewed her pulmonary function testing from 10/09/13 in full which showed mild obstruction and an FEV1 of 86% of predicted.   Began to have cough in setting of URI in January, she was treated with abx x 2. On both occasions she felt better temporarily. She had a persistent cough that is now dry, has been happening since after the bronchitis. She is on prevacid since a month ago > seemed to help the cough some. She was temporarily on allergy meds OTC but stopped these. She believes that the albuterol helps her cough.   ROV 02/18/15 - follow-up visit for COPD and also chronic cough. I suspect she also has allergic rhinitis and some component of GERD.  Still on loratadine and prevacid bid.  She is not requiring ProAir. She is not having cough, wheeze. Her cough has resolved. She  Has not been needing albuterol but acknowledges that her activity level has been low since breaking her R leg  ROV 08/13/15 - follow up visit for COPD and chronic cough. She tried to stop her Prevacid and had recurrence of her symptoms. She has restarted her Prevacid and is still on Claritin daily. Her chronic cough is controlled. She has been having worsening dyspnea with exertion. She is dyspneic with normal activities. She has occasional wheezing. For the past 3-4 days, she has been having a cough productive of light green mucous, dyspnea and chest pressure. No worsening of her chronic wheezing or dyspnea with exertion. She also has rhinorrhea, sinus congestion and post nasal drip. No fevers  or chills. +sick contacts.   ROV 10/11/15 -- patient with a history of COPD and chronic cough. She has contributions from GERD, chronic rhinitis.  At her last visit we started her on Spiriva to see if she would benefit. She believes that she has benefited, that her exercise tolerance is better.  She is back on prevacid, but has started to have frequent cough again. She was treated with pred and azithro since last time and improved temporarily. Now she is coughing again. No overt GERD sx.   ROV 01/24/16 -- patient with a history of tobacco use, mild obstruction on spirometry, chronic cough. She also deals with GERD, chronic rhinitis. At her last visit she was having significant difficulty with cough. We continued her loratadine and Prevacid, started to take his own nasal spray and chlorpheniramine as needed. I also temporarily stopped her Spiriva to see if this is a potential contributor to upper airway irritation. She is doing better - less UA irritation, less cough especially at night. She doesn't believe that she misses the spiriva, she uses albuterol rarely.    Review of Systems  Constitutional: Negative for fever and unexpected weight change.  HENT: Positive for dental problem. Negative for congestion, ear pain, nosebleeds, postnasal drip, rhinorrhea, sinus pressure, sneezing, sore throat and trouble swallowing.   Eyes: Negative for redness and itching.  Respiratory: Positive for cough and shortness of breath. Negative for chest tightness and wheezing.   Cardiovascular: Negative  for palpitations and leg swelling.  Gastrointestinal: Negative for nausea and vomiting.  Genitourinary: Negative for dysuria.  Musculoskeletal: Positive for myalgias and arthralgias. Negative for joint swelling.  Skin: Negative for rash.  Neurological: Negative for headaches.  Hematological: Does not bruise/bleed easily.  Psychiatric/Behavioral: Negative for dysphoric mood. The patient is not nervous/anxious.    Past  Medical History  Diagnosis Date  . HYPERLIPIDEMIA 12/17/2006  . HYPERTENSION 12/17/2006  . COPD 12/17/2006  . GERD 05/01/2007  . HYPERGLYCEMIA 08/27/2007  . Mood disorder (HCC)     hx anxiety and depression  . Arthritis     OA  . Rectal polyp 03/27/2007    adenoma  . Diverticulosis 03/27/2007  . Alcoholism in remission ([place]) 08/27/2007  . Anemia      Family History  Problem Relation Age of Onset  . Throat cancer Mother   . Heart attack Father 35  . Idiopathic pulmonary fibrosis Brother 29  . Cancer Brother     sarcoma  . Colon cancer Neg Hx      Social History   Social History  . Marital Status: Married    Spouse Name: N/A  . Number of Children: 2  . Years of Education: N/A   Occupational History  . retired    Social History Main Topics  . Smoking status: Former Smoker -- 2.00 packs/day for 30 years    Types: Cigarettes    Quit date: 07/10/1984  . Smokeless tobacco: Never Used  . Alcohol Use: No     Comment: no alcoho since 1983  . Drug Use: No  . Sexual Activity: Not on file   Other Topics Concern  . Not on file   Social History Narrative   Work or School: very active in [place] Situation: lives with husband      Spiritual Beliefs: Christian      Lifestyle:tries to walk; diet ok - denies any alcohol or tobacco use in many many years           Allergies  Allergen Reactions  . Dilaudid [Hydromorphone Hcl] Other (See Comments)    Pt had hypotension after dilaudid '1mg'$  IV while in the OR, required temporary pressor intervention.     Outpatient Prescriptions Prior to Visit  Medication Sig Dispense Refill  . acetaminophen (TYLENOL) 500 MG tablet Take 1,500 mg by mouth 2 (two) times daily. Reported on 09/28/2015    . albuterol (PROAIR HFA) 108 (90 Base) MCG/ACT inhaler INHALE 2 PUFFS EVERY 4 HOURS AS NEEDED FOR COUGHING SPELLS 8.5 Inhaler 2  . aspirin 81 MG tablet Take 81 mg by mouth daily.    . ferrous sulfate 325 (65 FE) MG tablet TAKE 1  TABLET BY MOUTH EVERY DAY 90 tablet 3  . fish oil-omega-3 fatty acids 1000 MG capsule Take 2 g by mouth daily.    Marland Kitchen KLOR-CON M20 20 MEQ tablet TAKE 1 TABLET BY MOUTH EVERY DAY 90 tablet 3  . lansoprazole (PREVACID) 15 MG capsule Take 1 capsule (15 mg total) by mouth daily. 90 capsule 3  . loratadine (CLARITIN) 10 MG tablet Take 1 tablet (10 mg total) by mouth daily. 90 tablet 3  . losartan-hydrochlorothiazide (HYZAAR) 100-12.5 MG tablet Take 1 tablet by mouth daily. 90 tablet 1  . Multiple Vitamin (MULTIVITAMIN WITH MINERALS) TABS tablet Take 1 tablet by mouth daily.    . simvastatin (ZOCOR) 20 MG tablet Take 1 tablet (20 mg total) by mouth every morning. [facility]  tablet 1  . Calcium Carbonate-Vitamin D (CALTRATE 600+D) 600-400 MG-UNIT per tablet Take 1 tablet by mouth 2 (two) times daily.      No facility-administered medications prior to visit.         Objective:   Physical Exam Filed Vitals:   01/24/16 1403 01/24/16 1404  BP:  128/74  Pulse:  80  Height: '5\' 5"'$  (1.651 m)   Weight: 203 lb (92.08 kg)   SpO2:  96%   Gen: Pleasant, well-nourished, in no distress,  normal affect  ENT: No lesions,  mouth clear, erythematous post pharynx, no postnasal drip  Neck: No JVD, very soft stridor on insp  Lungs: No use of accessory muscles, clear without rales or rhonchi, some referred UA noise  Cardiovascular: RRR, heart sounds normal, no murmur or gallops, no peripheral edema  Musculoskeletal: warm and well perfused  Neuro: alert, non focal  Skin: Warm, no lesions or rashes      Assessment & Plan:  COPD (chronic obstructive pulmonary disease) Mild disease. Appears to be chronically stable after discontinuation of Spiriva. She will continue her treatment for GERD, rhinitis, and use albuterol when necessary.  GERD Prevacid as ordered  Chronic rhinitis Chlorpheniramine, loratadine. She will start using fluticasone nasal spray during the allergy seasons  Chronic cough Appears to  have benefited from the discontinuation of Spiriva. We will not restart at this time. Continue her GERD and rhinitis therapy.   Baltazar Apo, MD, PhD 01/24/2016, 2:22 PM [facility] [phone] or if no answer [phone]

## 2016-03-17 DIAGNOSIS — R69 Illness, unspecified: Secondary | ICD-10-CM | POA: Diagnosis not present

## 2016-04-26 ENCOUNTER — Telehealth: Payer: Self-pay

## 2016-04-26 NOTE — Telephone Encounter (Signed)
Call to Sheila Shields and to cancel AWV tomorrow. Dr. Maudie Mercury completed 7/17

## 2016-04-27 ENCOUNTER — Ambulatory Visit: Payer: Medicare HMO

## 2016-05-03 ENCOUNTER — Other Ambulatory Visit: Payer: Self-pay | Admitting: Family Medicine

## 2016-05-21 NOTE — Progress Notes (Signed)
HPI:  HTN: -meds: asa, losartan-hctz 100-12.5 -no CP, SOB, DOE  GERD: -prevacid 15 -stable  HLD: -meds: simvastatin, fish oil -doing well -no regular exercise due to ankle issues, but may start seated aerobics at [employer]  Hx of Anemia, chronic: -dx and eval with prior PCP per report -takes iron daily -ok last check  OA/chronic back pain: -sees ortho - Dr. Maxie Better and Dr. Nelva Bush -take tylenol most days, wonders if could take nsaid occ  COPD/Chronic Cough/Allergies: -seeing pulm -spiriva added 7/17 -meds: loratidine, chlorpheniramine, pravacid, spiriva - flonase in the sping and fall, alb prn per review last pulm notes   ROS: See pertinent positives and negatives per HPI.  Past Medical History:  Diagnosis Date  . Alcoholism in remission ([place]) 08/27/2007  . Anemia   . Arthritis    OA  . COPD 12/17/2006  . Diverticulosis 03/27/2007  . GERD 05/01/2007  . HYPERGLYCEMIA 08/27/2007  . HYPERLIPIDEMIA 12/17/2006  . HYPERTENSION 12/17/2006  . Mood disorder (HCC)    hx anxiety and depression  . Rectal polyp 03/27/2007   adenoma    Past Surgical History:  Procedure Laterality Date  . ABDOMINAL HYSTERECTOMY  1978   fibroma  . APPENDECTOMY  2002  . BREAST BIOPSY     negative  . CARPAL TUNNEL RELEASE Left 2010 or 2011  . COLONOSCOPY W/ POLYPECTOMY  03/27/2007; n7/19/2012   2008: 4 mm adenoma, diverticulosis 2012: ileitis ? NSAID - likely, 2-3 mm cecal polyp LYMPHOID FOLLICLE, diverticulosis  . ESOPHAGOGASTRODUODENOSCOPY  01/26/2011   reflux esophagitis, colonscopy done also  . HAMMER TOE SURGERY  2008   left foot  . ORIF ANKLE FRACTURE Right 12/22/2014   Procedure: OPEN REDUCTION INTERNAL FIXATION (ORIF) ANKLE FRACTURE;  Surgeon: Susa Day, MD;  Location: [facility];  Service: Orthopedics;  Laterality: Right;  . SHOULDER OPEN ROTATOR CUFF REPAIR Left 03/06/2013   Procedure: LEFT ROTATOR CUFF REPAIR, SUBACROMIAL DECOMPRESSION, PATCH GRAFT, MANIPULATION UNDER ANESTHESIA;   Surgeon: Johnn Hai, MD;  Location: [facility];  Service: Orthopedics;  Laterality: Left;    Family History  Problem Relation Age of Onset  . Throat cancer Mother   . Heart attack Father 72  . Idiopathic pulmonary fibrosis Brother 65  . Cancer Brother     sarcoma  . Colon cancer Neg Hx     Social History   Social History  . Marital status: Married    Spouse name: N/A  . Number of children: 2  . Years of education: N/A   Occupational History  . retired    Social History Main Topics  . Smoking status: Former Smoker    Packs/day: 2.00    Years: 30.00    Types: Cigarettes    Quit date: 07/10/1984  . Smokeless tobacco: Never Used  . Alcohol use No     Comment: no alcoho since 1983  . Drug use: No  . Sexual activity: Not Asked   Other Topics Concern  . None   Social History Narrative   Work or School: very active in [place] Situation: lives with husband      Spiritual Beliefs: Christian      Lifestyle:tries to walk; diet ok - denies any alcohol or tobacco use in many many years           Current Outpatient Prescriptions:  .  acetaminophen (TYLENOL) 500 MG tablet, Take 1,500 mg by mouth 2 (two) times daily. Reported on 09/28/2015, Disp: , Rfl:  .  albuterol (PROAIR HFA) 108 (90 Base) MCG/ACT inhaler, INHALE 2 PUFFS EVERY 4 HOURS AS NEEDED FOR COUGHING SPELLS, Disp: 8.5 Inhaler, Rfl: 2 .  aspirin 81 MG tablet, Take 81 mg by mouth daily., Disp: , Rfl:  .  ferrous sulfate 325 (65 FE) MG tablet, TAKE 1 TABLET BY MOUTH EVERY DAY, Disp: 90 tablet, Rfl: 3 .  fish oil-omega-3 fatty acids 1000 MG capsule, Take 2 g by mouth daily., Disp: , Rfl:  .  KLOR-CON M20 20 MEQ tablet, TAKE 1 TABLET BY MOUTH EVERY DAY, Disp: 90 tablet, Rfl: 3 .  lansoprazole (PREVACID) 15 MG capsule, Take 1 capsule (15 mg total) by mouth daily., Disp: 90 capsule, Rfl: 3 .  loratadine (CLARITIN) 10 MG tablet, Take 1 tablet (10 mg total) by mouth daily., Disp: 90 tablet, Rfl: 3 .   losartan-hydrochlorothiazide (HYZAAR) 100-12.5 MG tablet, Take 1 tablet by mouth daily., Disp: 90 tablet, Rfl: 1 .  Multiple Vitamin (MULTIVITAMIN WITH MINERALS) TABS tablet, Take 1 tablet by mouth daily., Disp: , Rfl:  .  simvastatin (ZOCOR) 20 MG tablet, TAKE 1 TABLET BY MOUTH EVERY MORNING, Disp: 90 tablet, Rfl: 2 .  Vitamin D, Cholecalciferol, 1000 units CAPS, Take 1 capsule by mouth daily., Disp: , Rfl:   EXAM:  Vitals:   05/22/16 0901  BP: 100/62  Pulse: 85  Temp: 97.5 F (36.4 C)    Body mass index is 33.78 kg/m.  GENERAL: vitals reviewed and listed above, alert, oriented, appears well hydrated and in no acute distress  HEENT: atraumatic, conjunttiva clear, no obvious abnormalities on inspection of external nose and ears  NECK: no obvious masses on inspection  LUNGS: clear to auscultation bilaterally, no wheezes, rales or rhonchi, good air movement  CV: HRRR, no peripheral edema  MS: moves all extremities without noticeable abnormality  PSYCH: pleasant and cooperative, no obvious depression or anxiety  ASSESSMENT AND PLAN:  Discussed the following assessment and plan:  Essential hypertension - Plan: Basic metabolic panel, CBC (no diff)  Hyperlipidemia, unspecified hyperlipidemia type  HYPERGLYCEMIA  Gastroesophageal reflux disease without esophagitis  Chronic obstructive pulmonary disease, unspecified COPD type (HCC)  Arthritis -discussed risks/benefits nsaids and other treatment for chronic pain from OA -discussed exercises she could do despite her condition -doing well otherwise -labs today -follow up 3-4 months -Patient advised to return or notify a doctor immediately if symptoms worsen or persist or new concerns arise.  Patient Instructions  BEFORE YOU LEAVE: -labs -follow up: 3-4 months  It was good to see you today.  Can try aleve on bad days for the arthritic pain.  Consider starting the seated aerobics to ensure getting regular  exercise.  We have ordered labs or studies at this visit. It can take up to 1-2 weeks for results and processing. IF results require follow up or explanation, we will call you with instructions. Clinically stable results will be released to your [facility]. If you have not heard from [place] or cannot find your results in [facility] in 2 weeks please contact our office at [phone].  If you are not yet signed up for [facility], please consider signing up.  If you are not yet signed up for [facility], please SIGN UP TODAY. We now offer online scheduling, same day appointments and extended hours. WHEN YOU DON'T FEEL YOUR BEST.Marland KitchenMarland KitchenWE ARE HERE TO HELP.   We recommend the following healthy lifestyle for LIFE: 1) Small portions.   Tip: eat off of a salad plate instead of a dinner plate.  Tip: if you need more or a snack choose fruits, veggies and/or a handful of nuts or seeds.  2) Eat a healthy clean diet.  * Tip: Avoid (less then 1 serving per week): processed foods, sweets, sweetened drinks, white starches (rice, flour, bread, potatoes, pasta, etc), red meat, fast foods, butter  *Tip: CHOOSE instead   * 5-9 servings per day of fresh or frozen fruits and vegetables (but not corn, potatoes, bananas, canned or dried fruit)   *nuts and seeds, beans   *olives and olive oil   *small portions of lean meats such as fish and white chicken    *small portions of whole grains  3)Get at least 150 minutes of sweaty aerobic exercise per week.  4)Reduce stress - consider counseling, meditation and relaxation to balance other aspects of your life.        Colin Benton R., DO

## 2016-05-22 ENCOUNTER — Encounter: Payer: Self-pay | Admitting: Family Medicine

## 2016-05-22 ENCOUNTER — Ambulatory Visit (INDEPENDENT_AMBULATORY_CARE_PROVIDER_SITE_OTHER): Payer: Medicare HMO | Admitting: Family Medicine

## 2016-05-22 VITALS — BP 100/62 | HR 85 | Temp 97.5°F | Ht 65.0 in | Wt 203.0 lb

## 2016-05-22 DIAGNOSIS — J449 Chronic obstructive pulmonary disease, unspecified: Secondary | ICD-10-CM

## 2016-05-22 DIAGNOSIS — I10 Essential (primary) hypertension: Secondary | ICD-10-CM | POA: Diagnosis not present

## 2016-05-22 DIAGNOSIS — K219 Gastro-esophageal reflux disease without esophagitis: Secondary | ICD-10-CM | POA: Diagnosis not present

## 2016-05-22 DIAGNOSIS — E785 Hyperlipidemia, unspecified: Secondary | ICD-10-CM

## 2016-05-22 DIAGNOSIS — M1990 Unspecified osteoarthritis, unspecified site: Secondary | ICD-10-CM

## 2016-05-22 DIAGNOSIS — R05 Cough: Secondary | ICD-10-CM | POA: Diagnosis not present

## 2016-05-22 DIAGNOSIS — R7309 Other abnormal glucose: Secondary | ICD-10-CM

## 2016-05-22 DIAGNOSIS — J300 Vasomotor rhinitis: Secondary | ICD-10-CM | POA: Diagnosis not present

## 2016-05-22 LAB — BASIC METABOLIC PANEL
BUN: 19 mg/dL (ref 6–23)
CALCIUM: 10 mg/dL (ref 8.4–10.5)
CHLORIDE: 103 meq/L (ref 96–112)
CO2: 31 meq/L (ref 19–32)
CREATININE: 0.76 mg/dL (ref 0.40–1.20)
GFR: 77.96 mL/min (ref >60.00)
GLUCOSE: 103 mg/dL — AB (ref 70–99)
Potassium: 3.8 mEq/L (ref 3.5–5.1)
Sodium: 142 mEq/L (ref 135–145)

## 2016-05-22 LAB — CBC
HEMATOCRIT: 43.7 % (ref 36.0–46.0)
Hemoglobin: 14.7 g/dL (ref 12.0–15.0)
MCHC: 33.6 g/dL (ref 30.0–36.0)
MCV: 91.8 fl (ref 78.0–100.0)
Platelets: 292 10*3/uL (ref 150.0–400.0)
RBC: 4.76 Mil/uL (ref 3.87–5.11)
RDW: 13.7 % (ref 11.5–15.5)
WBC: 6.9 10*3/uL (ref 4.0–10.5)

## 2016-05-22 NOTE — Progress Notes (Signed)
Pre visit review using our clinic review tool, if applicable. No additional management support is needed unless otherwise documented below in the visit note. 

## 2016-05-22 NOTE — Patient Instructions (Signed)
BEFORE YOU LEAVE: -labs -follow up: 3-4 months  It was good to see you today.  Can try aleve on bad days for the arthritic pain.  Consider starting the seated aerobics to ensure getting regular exercise.  We have ordered labs or studies at this visit. It can take up to 1-2 weeks for results and processing. IF results require follow up or explanation, we will call you with instructions. Clinically stable results will be released to your [facility]. If you have not heard from [place] or cannot find your results in [facility] in 2 weeks please contact our office at [phone].  If you are not yet signed up for [facility], please consider signing up.  If you are not yet signed up for [facility], please SIGN UP TODAY. We now offer online scheduling, same day appointments and extended hours. WHEN YOU DON'T FEEL YOUR BEST.Marland KitchenMarland KitchenWE ARE HERE TO HELP.   We recommend the following healthy lifestyle for LIFE: 1) Small portions.   Tip: eat off of a salad plate instead of a dinner plate.  Tip: if you need more or a snack choose fruits, veggies and/or a handful of nuts or seeds.  2) Eat a healthy clean diet.  * Tip: Avoid (less then 1 serving per week): processed foods, sweets, sweetened drinks, white starches (rice, flour, bread, potatoes, pasta, etc), red meat, fast foods, butter  *Tip: CHOOSE instead   * 5-9 servings per day of fresh or frozen fruits and vegetables (but not corn, potatoes, bananas, canned or dried fruit)   *nuts and seeds, beans   *olives and olive oil   *small portions of lean meats such as fish and white chicken    *small portions of whole grains  3)Get at least 150 minutes of sweaty aerobic exercise per week.  4)Reduce stress - consider counseling, meditation and relaxation to balance other aspects of your life.

## 2016-05-24 ENCOUNTER — Other Ambulatory Visit: Payer: Self-pay | Admitting: Family Medicine

## 2016-06-06 DIAGNOSIS — L812 Freckles: Secondary | ICD-10-CM | POA: Diagnosis not present

## 2016-06-06 DIAGNOSIS — Z85820 Personal history of malignant melanoma of skin: Secondary | ICD-10-CM | POA: Diagnosis not present

## 2016-06-06 DIAGNOSIS — L570 Actinic keratosis: Secondary | ICD-10-CM | POA: Diagnosis not present

## 2016-06-06 DIAGNOSIS — D1801 Hemangioma of skin and subcutaneous tissue: Secondary | ICD-10-CM | POA: Diagnosis not present

## 2016-06-06 DIAGNOSIS — L821 Other seborrheic keratosis: Secondary | ICD-10-CM | POA: Diagnosis not present

## 2016-06-27 DIAGNOSIS — M19171 Post-traumatic osteoarthritis, right ankle and foot: Secondary | ICD-10-CM | POA: Diagnosis not present

## 2016-06-27 DIAGNOSIS — Z8781 Personal history of (healed) traumatic fracture: Secondary | ICD-10-CM | POA: Diagnosis not present

## 2016-06-27 DIAGNOSIS — Z967 Presence of other bone and tendon implants: Secondary | ICD-10-CM | POA: Diagnosis not present

## 2016-06-27 DIAGNOSIS — Z9889 Other specified postprocedural states: Secondary | ICD-10-CM | POA: Diagnosis not present

## 2016-08-19 ENCOUNTER — Other Ambulatory Visit: Payer: Self-pay | Admitting: Family Medicine

## 2016-09-12 ENCOUNTER — Other Ambulatory Visit: Payer: Self-pay | Admitting: Family Medicine

## 2016-09-12 DIAGNOSIS — Z1231 Encounter for screening mammogram for malignant neoplasm of breast: Secondary | ICD-10-CM

## 2016-09-17 NOTE — Progress Notes (Signed)
HPI:  Sheila Shields is a pleasant 82 y.o. here for follow up. Chronic medical problems summarized below were reviewed for changes and stability and were updated as needed below. These issues and their treatment remain stable for the most part. She does unfortunately deal with pain in multiple areas related mainly to OA. She sees ortho for this. Tylenol helps a little. Topical product OTC seem to work best form her report and Aleve is helpful. She wants to get in shape for a [place] trip - hip and knee and shoulder pain limit this.Denies CP, SOB, DOE, treatment intolerance or new symptoms. Due for labs, Medicare exam in July.  HTN: -meds: asa, losartan-hctz 100-12.5  GERD: -prevacid 15  HLD: -meds: simvastatin, fish oil  Hx of Anemia, chronic: -dx and eval with prior PCP per report -takes iron daily  OA/chronic back pain: -sees ortho - Dr. Maxie Better and Dr. Nelva Bush -take tylenol most days  COPD/Chronic Cough/Allergies: -seeing pulm -spiriva added 7/17 -meds: loratidine, chlorpheniramine, pravacid, spiriva - flonase in the sping and fall, alb prn per review last pulm notes -reports did much better this year then prior years   ROS: See pertinent positives and negatives per HPI.  Past Medical History:  Diagnosis Date  . Alcoholism in remission ([place]) 08/27/2007  . Anemia   . Arthritis    OA  . COPD 12/17/2006  . Diverticulosis 03/27/2007  . GERD 05/01/2007  . HYPERGLYCEMIA 08/27/2007  . HYPERLIPIDEMIA 12/17/2006  . HYPERTENSION 12/17/2006  . Mood disorder (HCC)    hx anxiety and depression  . Rectal polyp 03/27/2007   adenoma    Past Surgical History:  Procedure Laterality Date  . ABDOMINAL HYSTERECTOMY  1978   fibroma  . APPENDECTOMY  2002  . BREAST BIOPSY     negative  . CARPAL TUNNEL RELEASE Left 2010 or 2011  . COLONOSCOPY W/ POLYPECTOMY  03/27/2007; n7/19/2012   2008: 4 mm adenoma, diverticulosis 2012: ileitis ? NSAID - likely, 2-3 mm cecal polyp LYMPHOID FOLLICLE,  diverticulosis  . ESOPHAGOGASTRODUODENOSCOPY  01/26/2011   reflux esophagitis, colonscopy done also  . HAMMER TOE SURGERY  2008   left foot  . ORIF ANKLE FRACTURE Right 12/22/2014   Procedure: OPEN REDUCTION INTERNAL FIXATION (ORIF) ANKLE FRACTURE;  Surgeon: Susa Day, MD;  Location: [facility];  Service: Orthopedics;  Laterality: Right;  . SHOULDER OPEN ROTATOR CUFF REPAIR Left 03/06/2013   Procedure: LEFT ROTATOR CUFF REPAIR, SUBACROMIAL DECOMPRESSION, PATCH GRAFT, MANIPULATION UNDER ANESTHESIA;  Surgeon: Johnn Hai, MD;  Location: [facility];  Service: Orthopedics;  Laterality: Left;    Family History  Problem Relation Age of Onset  . Throat cancer Mother   . Heart attack Father 44  . Idiopathic pulmonary fibrosis Brother 56  . Cancer Brother     sarcoma  . Colon cancer Neg Hx     Social History   Social History  . Marital status: Married    Spouse name: N/A  . Number of children: 2  . Years of education: N/A   Occupational History  . retired    Social History Main Topics  . Smoking status: Former Smoker    Packs/day: 2.00    Years: 30.00    Types: Cigarettes    Quit date: 07/10/1984  . Smokeless tobacco: Never Used  . Alcohol use No     Comment: no alcoho since 1983  . Drug use: No  . Sexual activity: Not Asked   Other Topics Concern  . None  Social History Narrative   Work or School: very active in [place] Situation: lives with husband      Spiritual Beliefs: Christian      Lifestyle:tries to walk; diet ok - denies any alcohol or tobacco use in many many years           Current Outpatient Prescriptions:  .  acetaminophen (TYLENOL) 500 MG tablet, Take 1,500 mg by mouth 2 (two) times daily. Reported on 09/28/2015, Disp: , Rfl:  .  albuterol (PROAIR HFA) 108 (90 Base) MCG/ACT inhaler, INHALE 2 PUFFS EVERY 4 HOURS AS NEEDED FOR COUGHING SPELLS, Disp: 8.5 Inhaler, Rfl: 2 .  aspirin 81 MG tablet, Take 81 mg by mouth daily., Disp: , Rfl:  .   Coenzyme Q10 (COQ10 PO), Take by mouth daily., Disp: , Rfl:  .  ferrous sulfate 325 (65 FE) MG tablet, TAKE 1 TABLET BY MOUTH EVERY DAY, Disp: 90 tablet, Rfl: 3 .  fish oil-omega-3 fatty acids 1000 MG capsule, Take 2 g by mouth daily., Disp: , Rfl:  .  lansoprazole (PREVACID) 15 MG capsule, Take 1 capsule (15 mg total) by mouth daily., Disp: 90 capsule, Rfl: 3 .  loratadine (CLARITIN) 10 MG tablet, Take 1 tablet (10 mg total) by mouth daily. (Patient taking differently: Take 10 mg by mouth daily with breakfast. ), Disp: 90 tablet, Rfl: 3 .  losartan-hydrochlorothiazide (HYZAAR) 100-12.5 MG tablet, TAKE 1 TABLET BY MOUTH DAILY, Disp: 90 tablet, Rfl: 1 .  Multiple Vitamin (MULTIVITAMIN WITH MINERALS) TABS tablet, Take 1 tablet by mouth daily., Disp: , Rfl:  .  OVER THE COUNTER MEDICATION, Allergy medication (name unknown), Disp: , Rfl:  .  potassium chloride SA (K-DUR,KLOR-CON) 20 MEQ tablet, TAKE 1 TABLET BY MOUTH DAILY, Disp: 90 tablet, Rfl: 1 .  simvastatin (ZOCOR) 20 MG tablet, TAKE 1 TABLET BY MOUTH EVERY MORNING, Disp: 90 tablet, Rfl: 2 .  Vitamin D, Cholecalciferol, 1000 units CAPS, Take 1 capsule by mouth daily., Disp: , Rfl:   EXAM:  Vitals:   09/18/16 0837  BP: 120/68  Pulse: 74  Temp: 97.4 F (36.3 C)    Body mass index is 34.55 kg/m.  GENERAL: vitals reviewed and listed above, alert, oriented, appears well hydrated and in no acute distress  HEENT: atraumatic, conjunttiva clear, no obvious abnormalities on inspection of external nose and ears  NECK: no obvious masses on inspection  LUNGS: clear to auscultation bilaterally, no wheezes, rales or rhonchi, good air movement  CV: HRRR, no peripheral edema  MS: moves all extremities without noticeable abnormality  PSYCH: pleasant and cooperative, no obvious depression or anxiety  ASSESSMENT AND PLAN:  Discussed the following assessment and plan:  Essential hypertension - Plan: Basic metabolic panel,  CBC  Hyperlipidemia, unspecified hyperlipidemia type  HYPERGLYCEMIA - Plan: Hemoglobin A1c  Pain of left hip joint - Plan: Ambulatory referral to Physical Therapy  Neck pain - Plan: Ambulatory referral to Physical Therapy  Osteoarthritis, unspecified osteoarthritis type, unspecified site - Plan: Ambulatory referral to Physical Therapy  Physical deconditioning - Plan: Ambulatory referral to Physical Therapy  Chronic obstructive pulmonary disease, unspecified COPD type ([place])  BMI 34.0-34.9,adult  -labs -lifestyle recs -for chronic OA pain advised healthy diet, regular exercise, PT referral to assist in getting in shape, combination/alternating of prn OTC products for pain as needed, consideration massage, acupuncture, etc. She feels massage really helped in the past and may try again -Patient advised to return or notify a doctor  immediately if symptoms worsen or persist or new concerns arise.  There are no Patient Instructions on file for this visit.  Colin Benton R., DO

## 2016-09-18 ENCOUNTER — Ambulatory Visit (INDEPENDENT_AMBULATORY_CARE_PROVIDER_SITE_OTHER): Payer: Medicare HMO | Admitting: Family Medicine

## 2016-09-18 ENCOUNTER — Encounter: Payer: Self-pay | Admitting: Family Medicine

## 2016-09-18 VITALS — BP 120/68 | HR 74 | Temp 97.4°F | Ht 65.0 in | Wt 207.6 lb

## 2016-09-18 DIAGNOSIS — M25552 Pain in left hip: Secondary | ICD-10-CM | POA: Diagnosis not present

## 2016-09-18 DIAGNOSIS — R7309 Other abnormal glucose: Secondary | ICD-10-CM

## 2016-09-18 DIAGNOSIS — M1990 Unspecified osteoarthritis, unspecified site: Secondary | ICD-10-CM

## 2016-09-18 DIAGNOSIS — J449 Chronic obstructive pulmonary disease, unspecified: Secondary | ICD-10-CM | POA: Diagnosis not present

## 2016-09-18 DIAGNOSIS — I10 Essential (primary) hypertension: Secondary | ICD-10-CM | POA: Diagnosis not present

## 2016-09-18 DIAGNOSIS — R5381 Other malaise: Secondary | ICD-10-CM | POA: Diagnosis not present

## 2016-09-18 DIAGNOSIS — M542 Cervicalgia: Secondary | ICD-10-CM | POA: Diagnosis not present

## 2016-09-18 DIAGNOSIS — Z6834 Body mass index (BMI) 34.0-34.9, adult: Secondary | ICD-10-CM | POA: Diagnosis not present

## 2016-09-18 DIAGNOSIS — E785 Hyperlipidemia, unspecified: Secondary | ICD-10-CM | POA: Diagnosis not present

## 2016-09-18 LAB — CBC
HEMATOCRIT: 42.4 % (ref 36.0–46.0)
HEMOGLOBIN: 14.5 g/dL (ref 12.0–15.0)
MCHC: 34.2 g/dL (ref 30.0–36.0)
MCV: 92.5 fl (ref 78.0–100.0)
PLATELETS: 277 10*3/uL (ref 150.0–400.0)
RBC: 4.58 Mil/uL (ref 3.87–5.11)
RDW: 13.4 % (ref 11.5–15.5)
WBC: 7.4 10*3/uL (ref 4.0–10.5)

## 2016-09-18 LAB — BASIC METABOLIC PANEL
BUN: 15 mg/dL (ref 6–23)
CO2: 30 meq/L (ref 19–32)
Calcium: 10 mg/dL (ref 8.4–10.5)
Chloride: 106 mEq/L (ref 96–112)
Creatinine, Ser: 0.72 mg/dL (ref 0.40–1.20)
GFR: 82.91 mL/min (ref >60.00)
GLUCOSE: 101 mg/dL — AB (ref 70–99)
POTASSIUM: 4.4 meq/L (ref 3.5–5.1)
SODIUM: 143 meq/L (ref 135–145)

## 2016-09-18 LAB — HEMOGLOBIN A1C: Hgb A1c MFr Bld: 6.1 % (ref 4.6–6.5)

## 2016-09-18 NOTE — Progress Notes (Signed)
Pre visit review using our clinic review tool, if applicable. No additional management support is needed unless otherwise documented below in the visit note. 

## 2016-09-18 NOTE — Patient Instructions (Addendum)
BEFORE YOU LEAVE: -follow up: 3 months follow up with Dr. Maudie Mercury -Medicare exam with Manuela Schwartz in July -labs  -We placed a referral for you as discussed to the physical therapist. It usually takes about 1-2 weeks to process and schedule this referral. If you have not heard from [place] regarding this appointment in 2 weeks please contact our office.   We recommend the following healthy lifestyle for LIFE: 1) Small portions.   Tip: eat off of a salad plate instead of a dinner plate.  Tip: It is ok to feel hungry after a meal  Tip: if you need more or a snack choose fruits, veggies and/or a handful of nuts or seeds.  2) Eat a healthy clean diet.  * Tip: Avoid (less then 1 serving per week): processed foods, sweets, sweetened drinks, white starches (rice, flour, bread, potatoes, pasta, etc), red meat, fast foods, butter  *Tip: CHOOSE instead   * 5-9 servings per day of fresh or frozen fruits and vegetables (but not corn, potatoes, bananas, canned or dried fruit)   *nuts and seeds, beans   *olives and olive oil   *small portions of lean meats such as fish and white chicken    *small portions of whole grains  3)Get at least 150 minutes of sweaty aerobic exercise per week.  4)Reduce stress - consider counseling, meditation and relaxation to balance other aspects of your life.  WE NOW OFFER   [facility] FAST TRACK!!!  SAME DAY Appointments for ACUTE CARE  Such as: Sprains, Injuries, cuts, abrasions, rashes, muscle pain, joint pain, back pain Colds, flu, sore throats, headache, allergies, cough, fever  Ear pain, sinus and eye infections Abdominal pain, nausea, vomiting, diarrhea, upset stomach Animal/insect bites  3 Easy Ways to Schedule: Walk-In Scheduling Call in scheduling Mychart Sign-up: [email or web address]

## 2016-09-21 ENCOUNTER — Ambulatory Visit: Payer: Medicare HMO

## 2016-09-22 ENCOUNTER — Encounter: Payer: Self-pay | Admitting: Physical Therapy

## 2016-09-22 ENCOUNTER — Ambulatory Visit: Payer: Medicare HMO | Attending: Family Medicine | Admitting: Physical Therapy

## 2016-09-22 DIAGNOSIS — R262 Difficulty in walking, not elsewhere classified: Secondary | ICD-10-CM

## 2016-09-22 DIAGNOSIS — M545 Low back pain: Secondary | ICD-10-CM | POA: Insufficient documentation

## 2016-09-22 DIAGNOSIS — M6281 Muscle weakness (generalized): Secondary | ICD-10-CM | POA: Diagnosis not present

## 2016-09-22 NOTE — Patient Instructions (Signed)
Knee to Chest    Lying supine, bend involved knee to chest _5__ times. Repeat with other leg. Hold for 10 sec.   Do __1-2_ times per day.  Copyright  VHI. All rights reserved.   Chair Sitting    Sit at edge of seat, spine straight, one leg extended. Put a hand on each thigh and bend forward from the hip, keeping spine straight. Allow hand on extended leg to reach toward toes. Support upper body with other arm. Hold __30_ seconds. Repeat _3__ times per session. Do _1-2__ sessions per day.  Copyright  VHI. All rights reserved.

## 2016-09-26 NOTE — Therapy (Signed)
[facility] [address], [place], [place], [address] Phone: [phone]   Fax:  [phone]  Physical Therapy Evaluation  Patient Details  Name: Sheila Shields MRN: [id number] Date of Birth: 04/21/1935 Referring Provider: Lucretia Kern, DO  Encounter Date: 09/22/2016      PT End of Session - 09/26/16 2341    Visit Number 1   Number of Visits 10   Date for PT Re-Evaluation 11/17/16   Authorization Type gcodes 10th visit; KX 15   Activity Tolerance Patient tolerated treatment well   Behavior During Therapy [facility] for tasks assessed/performed      Past Medical History:  Diagnosis Date  . Alcoholism in remission ([place]) 08/27/2007  . Anemia   . Arthritis    OA  . COPD 12/17/2006  . Diverticulosis 03/27/2007  . GERD 05/01/2007  . HYPERGLYCEMIA 08/27/2007  . HYPERLIPIDEMIA 12/17/2006  . HYPERTENSION 12/17/2006  . Mood disorder (HCC)    hx anxiety and depression  . Rectal polyp 03/27/2007   adenoma    Past Surgical History:  Procedure Laterality Date  . ABDOMINAL HYSTERECTOMY  1978   fibroma  . APPENDECTOMY  2002  . BREAST BIOPSY     negative  . CARPAL TUNNEL RELEASE Left 2010 or 2011  . COLONOSCOPY W/ POLYPECTOMY  03/27/2007; n7/19/2012   2008: 4 mm adenoma, diverticulosis 2012: ileitis ? NSAID - likely, 2-3 mm cecal polyp LYMPHOID FOLLICLE, diverticulosis  . ESOPHAGOGASTRODUODENOSCOPY  01/26/2011   reflux esophagitis, colonscopy done also  . HAMMER TOE SURGERY  2008   left foot  . ORIF ANKLE FRACTURE Right 12/22/2014   Procedure: OPEN REDUCTION INTERNAL FIXATION (ORIF) ANKLE FRACTURE;  Surgeon: Susa Day, MD;  Location: [facility];  Service: Orthopedics;  Laterality: Right;  . SHOULDER OPEN ROTATOR CUFF REPAIR Left 03/06/2013   Procedure: LEFT ROTATOR CUFF REPAIR, SUBACROMIAL DECOMPRESSION, PATCH GRAFT, MANIPULATION UNDER ANESTHESIA;  Surgeon: Johnn Hai, MD;  Location: [facility];  Service: Orthopedics;  Laterality:  Left;    There were no vitals filed for this visit.       Subjective Assessment - 09/26/16 2339    Subjective Started getting massages yesterday and neck feeling better.  Wants to go to [place] but needs to be able to walk. This morning right hip hurting but usually it's the left.  Can't take a lot of pain medicine only Tylenol . Using the shopping cart inthe grocery store helps when walking   Pertinent History chronic pain   Limitations Walking   How long can you walk comfortably? a big city block   Patient Stated Goals be able to walk; not have to have to use the wheel chair in the airport   Currently in Pain? Yes   Pain Score 6    Pain Location Back   Pain Orientation Right;Left;Lower   Pain Descriptors / Indicators Aching   Pain Type Chronic pain   Pain Radiating Towards down LE   Pain Onset More than a month ago   Pain Frequency Intermittent   Aggravating Factors  walking   Pain Relieving Factors rest   Effect of Pain on Daily Activities walking   Multiple Pain Sites No            [facility] PT Assessment - 09/26/16 0001      Assessment   Medical Diagnosis M25.552 pain in left hip; M54.2 neck pain; M19.90 primary osteoarthritis; R53.81 physical deconditioning   Prior Therapy yes     Precautions  Precautions None     Restrictions   Weight Bearing Restrictions No     Home Environment   Living Environment Private residence   Living Arrangements Spouse/significant other   Type of [place]     Prior Function   Level of Holiday Retired     Associate Professor   Overall Cognitive Status Within Functional Limits for tasks assessed     Observation/Other Assessments   Focus on Therapeutic Outcomes (FOTO)  63% limited     Posture/Postural Control   Posture/Postural Control Postural limitations   Postural Limitations Flexed trunk;Rounded Shoulders;Forward head     AROM   Overall AROM Comments lumbar flexion and extension 50% limited     PROM    Overall PROM Comments hamstring and hip external rotation 20% limited bilaterally     Strength   Overall Strength Comments core 3/5   Right Hip Flexion 4/5   Right Hip External Rotation  4/5   Right Hip ABduction 3+/5   Right Hip ADduction 4-/5   Left Hip Flexion 4-/5   Left Hip Extension 4/5   Left Hip External Rotation 4-/5   Left Hip ABduction 4/5   Left Hip ADduction 4-/5     Palpation   Palpation comment glutes and SI joints tendder bilaterally     Ambulation/Gait   Ambulation/Gait Yes   Ambulation/Gait Assistance 7: Independent   Gait Pattern Trunk flexed                   [facility] Adult PT Treatment/Exercise - 09/26/16 0001      Lumbar Exercises: Stretches   Single Knee to Chest Stretch 5 reps;10 seconds     Lumbar Exercises: Supine   Ab Set 5 reps;5 seconds   Bent Knee Raise 20 reps     Knee/Hip Exercises: Stretches   Active Hamstring Stretch Both;3 reps;30 seconds  supine and sitting     Knee/Hip Exercises: Aerobic   Nustep L1 x 6 min  PT present discussing course of treatment                  PT Short Term Goals - 09/26/16 2231      PT SHORT TERM GOAL #1   Title independent with initial HEP   Time 4   Period Weeks   Status New     PT SHORT TERM GOAL #2   Title able to walk 5 minutes at a time 2x/day to begin walking routine for exercise   Time 4   Period Weeks   Status New     PT SHORT TERM GOAL #3   Title hip pain reduced  by 50%   Time 4   Period Weeks   Status New           PT Long Term Goals - 09/26/16 2233      PT LONG TERM GOAL #1   Title independent with advanced HEP   Time 8   Period Weeks   Status New     PT LONG TERM GOAL #2   Title FOTO < or = to 49%   Time 8   Period Weeks   Status New     PT LONG TERM GOAL #3   Title pt will improve endurance to tolerate walking 15 minutes at a time for walking in community   Time 8   Period Weeks   Status New     PT LONG TERM GOAL #4   Title improve  hip strength to 4/5 bilaterally  for improved gait and functional mobility and transfers.   Time 8   Period Weeks   Status New               Plan - 09/26/16 2129    Clinical Impression Statement Patient presents for low complexity evaluation.  Pt is experiencing bilateral hip pain of 6/10 that increases and radiates into LE when walking.  Lumbar flexion such as walking with grocery cart decreases symptoms.  Patient has hip weakness of 3+/5 to 4-/5, core weakness of 3/5.  Patient has difficultywith supine<>sit.  Patient also has difficulty with bed mobility with increased pain performing supine<> sit.  Pt has increased flexion in posture and gait.  Patient is unable to walk greater than one block.  Patient needs skilled PT to address these impairments so patient will be able to return to active lifestyle and functional activities.   Rehab Potential Excellent   Clinical Impairments Affecting Rehab Potential chronic pain, history of abdominal hysterectomy   PT Frequency 2x / week   PT Duration 8 weeks   PT Treatment/Interventions ADLs/Self Care Home Management;Biofeedback;Cryotherapy;Electrical Stimulation;Iontophoresis '4mg'$ /ml Dexamethasone;Moist Heat;Traction;Ultrasound;Gait training;Stair training;Functional mobility training;Therapeutic activities;Therapeutic exercise;Balance training;Neuromuscular re-education;Patient/family education;Manual techniques;Passive range of motion;Dry needling;Taping   PT Next Visit Plan posture, core and LE strengthening, manual techniques as needed   Recommended Other Services none   Consulted and Agree with Plan of Care Patient      Patient will benefit from skilled therapeutic intervention in order to improve the following deficits and impairments:  Abnormal gait, Decreased activity tolerance, Decreased mobility, Decreased range of motion, Decreased endurance, Difficulty walking, Decreased strength, Increased muscle spasms, Pain, Postural  dysfunction  Visit Diagnosis: Muscle weakness (generalized) - Plan: PT plan of care cert/re-cert  Acute bilateral low back pain, with sciatica presence unspecified - Plan: PT plan of care cert/re-cert  Difficulty in walking, not elsewhere classified - Plan: PT plan of care cert/re-cert   PT G-Codes  Functional Assessment Tool Used (Outpatient Only) FOTO and clinical assessment byJacquelineDCrosser,PT at03/16/181011   Functional Limitation Mobility: Walking and moving around   Mobility: Walking and Moving Around Current Status ([id number]) At least 60 percent but less than 80 percent impaired, limited or restricted   Mobility: Walking and Moving Around Goal Status [phone]) At least 40 percent but less than 60 percent impaired, limited or restricted   Mobility: Walking and Moving Around Discharge Status [phone])       Problem List Patient Active Problem List   Diagnosis Date Noted  . Chronic rhinitis 01/24/2016  . Open right ankle fracture 12/22/2014  . COPD (chronic obstructive pulmonary disease) ([place]) 11/18/2014  . Iron deficiency anemia, unspecified 01/20/2011  . Arthropathy 02/23/2010  . HYPERGLYCEMIA 08/27/2007  . GERD 05/01/2007  . Hyperlipemia 12/17/2006  . Essential hypertension 12/17/2006    Zannie Cove, PT 09/26/2016, 11:41 PM  [facility] [address], [place], [place], [address] Phone: [phone]   Fax:  [phone]  Name: SHAWNIKA PEPIN MRN: [id number] Date of Birth: 23-Jun-1935

## 2016-09-27 ENCOUNTER — Encounter: Payer: Self-pay | Admitting: Physical Therapy

## 2016-09-27 ENCOUNTER — Ambulatory Visit: Payer: Medicare HMO | Admitting: Physical Therapy

## 2016-09-27 DIAGNOSIS — M545 Low back pain: Secondary | ICD-10-CM

## 2016-09-27 DIAGNOSIS — R262 Difficulty in walking, not elsewhere classified: Secondary | ICD-10-CM

## 2016-09-27 DIAGNOSIS — M6281 Muscle weakness (generalized): Secondary | ICD-10-CM

## 2016-09-27 NOTE — Patient Instructions (Addendum)
Posture Tips DO: - stand tall and erect - keep chin tucked in - keep head and shoulders in alignment - check posture regularly in mirror or large window - pull head back against headrest in car seat;  Change your position often.  Sit with lumbar support. DON'T: - slouch or slump while watching TV or reading - sit, stand or lie in one position  for too long;  Sitting is especially hard on the spine so if you sit at a desk/use the computer, then stand up often!   Copyright  VHI. All rights reserved.  Posture - Standing   Good posture is important. Avoid slouching and forward head thrust. Maintain curve in low back and align ears over shoul- ders, hips over ankles.  Pull your belly button in toward your back bone.   Copyright  VHI. All rights reserved.  Posture - Sitting   Sit upright, head facing forward. Try using a roll to support lower back. Keep shoulders relaxed, and avoid rounded back. Keep hips level with knees. Avoid crossing legs for long periods.   Copyright  VHI. All rights reserved.     Lifting Principles  .Maintain proper posture and head alignment. .Slide object as close as possible before lifting. .Move obstacles out of the way. .Test before lifting; ask for help if too heavy. .Tighten stomach muscles without holding breath. .Use smooth movements; do not jerk. .Use legs to do the work, and pivot with feet. .Distribute the work load symmetrically and close to the center of trunk. .Push instead of pull whenever possible.   Squat down and hold basket close to stand. Use leg muscles to do the work.    Avoid twisting or bending back. Pivot around using foot movements, and bend at knees if needed when reaching for articles.        Getting Into / Out of Bed   Lower self to lie down on one side by raising legs and lowering head at the same time. Use arms to assist moving without twisting. Bend both knees to roll onto back if desired. To sit up, start from  lying on side, and use same move-ments in reverse. Keep trunk aligned with legs.    Shift weight from front foot to back foot as item is lifted off shelf.    When leaning forward to pick object up from floor, extend one leg out behind. Keep back straight. Hold onto a sturdy support with other hand.      Sit upright, head facing forward. Try using a roll to support lower back. Keep shoulders relaxed, and avoid rounded back. Keep hips level with knees. Avoid crossing legs for long periods.

## 2016-09-27 NOTE — Therapy (Signed)
[facility] [address], [place], [place], [address] Phone: [phone]   Fax:  [phone]  Physical Therapy Treatment  Patient Details  Name: Sheila Shields MRN: [id number] Date of Birth: Oct 13, 1934 Referring Provider: Lucretia Kern, DO  Encounter Date: 09/27/2016      PT End of Session - 09/27/16 0847    Visit Number 2   Number of Visits 10   Date for PT Re-Evaluation 11/17/16   Authorization Type gcodes 10th visit; KX 15   PT Start Time 0844   PT Stop Time 0925   PT Time Calculation (min) 41 min   Activity Tolerance Patient tolerated treatment well   Behavior During Therapy [facility] for tasks assessed/performed      Past Medical History:  Diagnosis Date  . Alcoholism in remission ([place]) 08/27/2007  . Anemia   . Arthritis    OA  . COPD 12/17/2006  . Diverticulosis 03/27/2007  . GERD 05/01/2007  . HYPERGLYCEMIA 08/27/2007  . HYPERLIPIDEMIA 12/17/2006  . HYPERTENSION 12/17/2006  . Mood disorder (HCC)    hx anxiety and depression  . Rectal polyp 03/27/2007   adenoma    Past Surgical History:  Procedure Laterality Date  . ABDOMINAL HYSTERECTOMY  1978   fibroma  . APPENDECTOMY  2002  . BREAST BIOPSY     negative  . CARPAL TUNNEL RELEASE Left 2010 or 2011  . COLONOSCOPY W/ POLYPECTOMY  03/27/2007; n7/19/2012   2008: 4 mm adenoma, diverticulosis 2012: ileitis ? NSAID - likely, 2-3 mm cecal polyp LYMPHOID FOLLICLE, diverticulosis  . ESOPHAGOGASTRODUODENOSCOPY  01/26/2011   reflux esophagitis, colonscopy done also  . HAMMER TOE SURGERY  2008   left foot  . ORIF ANKLE FRACTURE Right 12/22/2014   Procedure: OPEN REDUCTION INTERNAL FIXATION (ORIF) ANKLE FRACTURE;  Surgeon: Susa Day, MD;  Location: [facility];  Service: Orthopedics;  Laterality: Right;  . SHOULDER OPEN ROTATOR CUFF REPAIR Left 03/06/2013   Procedure: LEFT ROTATOR CUFF REPAIR, SUBACROMIAL DECOMPRESSION, PATCH GRAFT, MANIPULATION UNDER ANESTHESIA;  Surgeon:  Johnn Hai, MD;  Location: [facility];  Service: Orthopedics;  Laterality: Left;    There were no vitals filed for this visit.      Subjective Assessment - 09/27/16 0847    Subjective Hip pain is abolished, this AM she reports minimal low back pain.    Currently in Pain? Yes   Pain Score 3    Pain Location Back   Pain Orientation Lower;Mid   Pain Descriptors / Indicators Dull   Multiple Pain Sites No                         [facility] Adult PT Treatment/Exercise - 09/27/16 0001      Therapeutic Activites    Therapeutic Activities --  ADLs with lumbar support, gave handout, demo education     Lumbar Exercises: Stretches   Active Hamstring Stretch 3 reps;10 seconds  Seated   Active Hamstring Stretch Limitations Leg lengthener stretch Bil 2x   Single Knee to Chest Stretch 3 reps;20 seconds   Lower Trunk Rotation --  20x slowly, then static 3x 20sec     Lumbar Exercises: Supine   Ab Set --  Ball squeeze, TA contraction 10x, then added glute squeeze10   Clam 20 reps;2 seconds  red band     Knee/Hip Exercises: Aerobic   Nustep L1 x 7 min     Shoulder Exercises: Seated   Horizontal ABduction Strengthening;Both  Theraband Level (Shoulder Horizontal ABduction) --  2x10 yellow band     Moist Heat Therapy   Number Minutes Moist Heat --  During supine exs   Moist Heat Location --  Lumbar                  PT Short Term Goals - 09/26/16 2231      PT SHORT TERM GOAL #1   Title independent with initial HEP   Time 4   Period Weeks   Status New     PT SHORT TERM GOAL #2   Title able to walk 5 minutes at a time 2x/day to begin walking routine for exercise   Time 4   Period Weeks   Status New     PT SHORT TERM GOAL #3   Title hip pain reduced  by 50%   Time 4   Period Weeks   Status New           PT Long Term Goals - 09/26/16 2233      PT LONG TERM GOAL #1   Title independent with advanced HEP   Time 8   Period Weeks   Status New      PT LONG TERM GOAL #2   Title FOTO < or = to 49%   Time 8   Period Weeks   Status New     PT LONG TERM GOAL #3   Title pt will improve endurance to tolerate walking 15 minutes at a time for walking in community   Time 8   Period Weeks   Status New     PT LONG TERM GOAL #4   Title improve hip strength to 4/5 bilaterally  for improved gait and functional mobility and transfers.   Time 8   Period Weeks   Status New               Plan - 09/27/16 0160    Clinical Impression Statement Pt compliant and independent in her initial HEP. Hip "issue" seems abolished per pt report today. Added a few core exercises to the program today, pt able to identify her core muscles and properly contract them.  Educated pt in how to support her spine with ADLS.    Rehab Potential Excellent   Clinical Impairments Affecting Rehab Potential chronic pain, history of abdominal hysterectomy   PT Frequency 2x / week   PT Duration 8 weeks   PT Treatment/Interventions ADLs/Self Care Home Management;Biofeedback;Cryotherapy;Electrical Stimulation;Iontophoresis '4mg'$ /ml Dexamethasone;Moist Heat;Traction;Ultrasound;Gait training;Stair training;Functional mobility training;Therapeutic activities;Therapeutic exercise;Balance training;Neuromuscular re-education;Patient/family education;Manual techniques;Passive range of motion;Dry needling;Taping   PT Next Visit Plan Core strength, LE strength, postural strength and endurance   Consulted and Agree with Plan of Care Patient      Patient will benefit from skilled therapeutic intervention in order to improve the following deficits and impairments:  Abnormal gait, Decreased activity tolerance, Decreased mobility, Decreased range of motion, Decreased endurance, Difficulty walking, Decreased strength, Increased muscle spasms, Pain, Postural dysfunction  Visit Diagnosis: Muscle weakness (generalized)  Acute bilateral low back pain, with sciatica presence  unspecified  Difficulty in walking, not elsewhere classified     Problem List Patient Active Problem List   Diagnosis Date Noted  . Chronic rhinitis 01/24/2016  . Open right ankle fracture 12/22/2014  . COPD (chronic obstructive pulmonary disease) ([place]) 11/18/2014  . Iron deficiency anemia, unspecified 01/20/2011  . Arthropathy 02/23/2010  . HYPERGLYCEMIA 08/27/2007  . GERD 05/01/2007  . Hyperlipemia 12/17/2006  . Essential hypertension 12/17/2006  Avrey Flanagin, PTA 09/27/2016, 9:28 AM  [facility] [address] [place], [place], [address] Phone: [phone]   Fax:  [phone]  Name: KIMBERLY NIELAND MRN: [id number] Date of Birth: Oct 28, 1934

## 2016-09-29 ENCOUNTER — Encounter: Payer: Self-pay | Admitting: Physical Therapy

## 2016-09-29 ENCOUNTER — Ambulatory Visit: Payer: Medicare HMO | Admitting: Physical Therapy

## 2016-09-29 DIAGNOSIS — M545 Low back pain: Secondary | ICD-10-CM

## 2016-09-29 DIAGNOSIS — M6281 Muscle weakness (generalized): Secondary | ICD-10-CM

## 2016-09-29 DIAGNOSIS — R262 Difficulty in walking, not elsewhere classified: Secondary | ICD-10-CM

## 2016-09-29 NOTE — Therapy (Signed)
[facility] [address], [place], [place], [address] Phone: [phone]   Fax:  [phone]  Physical Therapy Treatment  Patient Details  Name: Sheila Shields MRN: [id number] Date of Birth: 08/18/1934 Referring Provider: Lucretia Kern, DO  Encounter Date: 09/29/2016      PT End of Session - 09/29/16 0848    Visit Number 3   Number of Visits 10   Date for PT Re-Evaluation 11/17/16   Authorization Type gcodes 10th visit; KX 15   PT Start Time 0844   PT Stop Time 0928   PT Time Calculation (min) 44 min   Activity Tolerance Patient tolerated treatment well   Behavior During Therapy [facility] for tasks assessed/performed      Past Medical History:  Diagnosis Date  . Alcoholism in remission ([place]) 08/27/2007  . Anemia   . Arthritis    OA  . COPD 12/17/2006  . Diverticulosis 03/27/2007  . GERD 05/01/2007  . HYPERGLYCEMIA 08/27/2007  . HYPERLIPIDEMIA 12/17/2006  . HYPERTENSION 12/17/2006  . Mood disorder (HCC)    hx anxiety and depression  . Rectal polyp 03/27/2007   adenoma    Past Surgical History:  Procedure Laterality Date  . ABDOMINAL HYSTERECTOMY  1978   fibroma  . APPENDECTOMY  2002  . BREAST BIOPSY     negative  . CARPAL TUNNEL RELEASE Left 2010 or 2011  . COLONOSCOPY W/ POLYPECTOMY  03/27/2007; n7/19/2012   2008: 4 mm adenoma, diverticulosis 2012: ileitis ? NSAID - likely, 2-3 mm cecal polyp LYMPHOID FOLLICLE, diverticulosis  . ESOPHAGOGASTRODUODENOSCOPY  01/26/2011   reflux esophagitis, colonscopy done also  . HAMMER TOE SURGERY  2008   left foot  . ORIF ANKLE FRACTURE Right 12/22/2014   Procedure: OPEN REDUCTION INTERNAL FIXATION (ORIF) ANKLE FRACTURE;  Surgeon: Susa Day, MD;  [facility]: [facility];  Service: Orthopedics;  Laterality: Right;  . SHOULDER OPEN ROTATOR CUFF REPAIR Left 03/06/2013   Procedure: LEFT ROTATOR CUFF REPAIR, SUBACROMIAL DECOMPRESSION, PATCH GRAFT, MANIPULATION UNDER ANESTHESIA;  Surgeon:  Johnn Hai, MD;  [facility];  Service: Orthopedics;  Laterality: Left;    There were no vitals filed for this visit.      Subjective Assessment - 09/29/16 0847    Subjective Hip pain is gone, bakc pain is minimal.    Pertinent History chronic pain   Limitations Walking   How long can you walk comfortably? a big city block   Patient Stated Goals be able to walk; not have to have to use the wheel chair in the airport   Currently in Pain? Yes   Pain Score 3    Pain Location Back   Pain Orientation Mid;Lower   Pain Descriptors / Indicators Dull   Pain Type Chronic pain   Pain Radiating Towards Down LE   Pain Onset More than a month ago   Pain Frequency Intermittent                         [facility] Adult PT Treatment/Exercise - 09/29/16 0001      Therapeutic Activites    Therapeutic Activities Other Therapeutic Activities  Body mechanics and postural control     Lumbar Exercises: Stretches   Active Hamstring Stretch 3 reps;10 seconds  Seated   Active Hamstring Stretch Limitations Leg lengthener stretch Bil 2x   Single Knee to Chest Stretch 3 reps;20 seconds   Lower Trunk Rotation --  20x slowly, then static 3x  20sec   Piriformis Stretch 2 reps;20 seconds  Supine     Lumbar Exercises: Supine   Ab Set --  Ball squeeze, TA contraction 10x, then added glute squeeze10   Clam 20 reps;2 seconds  red band   Bent Knee Raise 20 reps   Bridge 10 reps     Knee/Hip Exercises: Aerobic   Nustep L1 x 7 min  Therapist present to discuss treatment     Knee/Hip Exercises: Seated   Sit to Sand 1 set;10 reps  Body mechanics     Shoulder Exercises: Seated   Horizontal ABduction Strengthening;Both   Theraband Level (Shoulder Horizontal ABduction) --  2x10 yellow band   External Rotation Strengthening;Both;10 reps;Theraband   Theraband Level (Shoulder External Rotation) Level 1 (Yellow)     Moist Heat Therapy   Number Minutes Moist Heat --  Conccurent with  supine exercises   Moist Heat Location Lumbar Spine                  PT Short Term Goals - 09/26/16 2231      PT SHORT TERM GOAL #1   Title independent with initial HEP   Time 4   Period Weeks   Status New     PT SHORT TERM GOAL #2   Title able to walk 5 minutes at a time 2x/day to begin walking routine for exercise   Time 4   Period Weeks   Status New     PT SHORT TERM GOAL #3   Title hip pain reduced  by 50%   Time 4   Period Weeks   Status New           PT Long Term Goals - 09/26/16 2233      PT LONG TERM GOAL #1   Title independent with advanced HEP   Time 8   Period Weeks   Status New     PT LONG TERM GOAL #2   Title FOTO < or = to 49%   Time 8   Period Weeks   Status New     PT LONG TERM GOAL #3   Title pt will improve endurance to tolerate walking 15 minutes at a time for walking in community   Time 8   Period Weeks   Status New     PT LONG TERM GOAL #4   Title improve hip strength to 4/5 bilaterally  for improved gait and functional mobility and transfers.   Time 8   Period Weeks   Status New               Plan - 09/29/16 1037    Clinical Impression Statement Pt reports having no hip pain and minimal back pain mostly with movement. Pt able to tolerate all exercies well with good core stability. Needing verbal cues for good posture upon sitting up. Pt will contine to benefit from skilled therapy for core strength and flexibility.   Rehab Potential Excellent   Clinical Impairments Affecting Rehab Potential chronic pain, history of abdominal hysterectomy   PT Frequency 2x / week   PT Duration 8 weeks   PT Treatment/Interventions ADLs/Self Care Home Management;Biofeedback;Cryotherapy;Electrical Stimulation;Iontophoresis '4mg'$ /ml Dexamethasone;Moist Heat;Traction;Ultrasound;Gait training;Stair training;Functional mobility training;Therapeutic activities;Therapeutic exercise;Balance training;Neuromuscular re-education;Patient/family  education;Manual techniques;Passive range of motion;Dry needling;Taping   PT Next Visit Plan Core strength, LE strength, postural strength and endurance   Consulted and Agree with Plan of Care Patient      Patient will benefit from skilled therapeutic intervention in  order to improve the following deficits and impairments:  Abnormal gait, Decreased activity tolerance, Decreased mobility, Decreased range of motion, Decreased endurance, Difficulty walking, Decreased strength, Increased muscle spasms, Pain, Postural dysfunction  Visit Diagnosis: Muscle weakness (generalized)  Acute bilateral low back pain, with sciatica presence unspecified  Difficulty in walking, not elsewhere classified     Problem List Patient Active Problem List   Diagnosis Date Noted  . Chronic rhinitis 01/24/2016  . Open right ankle fracture 12/22/2014  . COPD (chronic obstructive pulmonary disease) ([place]) 11/18/2014  . Iron deficiency anemia, unspecified 01/20/2011  . Arthropathy 02/23/2010  . HYPERGLYCEMIA 08/27/2007  . GERD 05/01/2007  . Hyperlipemia 12/17/2006  . Essential hypertension 12/17/2006    Mikle Bosworth PTA 09/29/2016, 10:39 AM  [facility] [address], [place], [place], [address] Phone: [phone]   Fax:  [phone]  Name: Sheila Shields MRN: [id number] Date of Birth: June 25, 1935

## 2016-10-02 ENCOUNTER — Ambulatory Visit: Payer: Medicare HMO | Admitting: Physical Therapy

## 2016-10-02 ENCOUNTER — Encounter: Payer: Self-pay | Admitting: Physical Therapy

## 2016-10-02 DIAGNOSIS — R262 Difficulty in walking, not elsewhere classified: Secondary | ICD-10-CM

## 2016-10-02 DIAGNOSIS — M6281 Muscle weakness (generalized): Secondary | ICD-10-CM

## 2016-10-02 DIAGNOSIS — M545 Low back pain: Secondary | ICD-10-CM

## 2016-10-02 NOTE — Therapy (Signed)
[facility] [address] [place], [place], [address] Phone: [phone]   Fax:  [phone]  Physical Therapy Treatment  Patient Details  Name: Sheila Shields MRN: [id number] Date of Birth: December 21, 1934 Referring Provider: Lucretia Kern, DO  Encounter Date: 10/02/2016      PT End of Session - 10/02/16 0936    Visit Number 4   Number of Visits 10   Date for PT Re-Evaluation 11/17/16   Authorization Type gcodes 10th visit; KX 15   PT Start Time 0930   PT Stop Time 1010   PT Time Calculation (min) 40 min   Activity Tolerance Patient tolerated treatment well   Behavior During Therapy [facility] for tasks assessed/performed      Past Medical History:  Diagnosis Date  . Alcoholism in remission ([place]) 08/27/2007  . Anemia   . Arthritis    OA  . COPD 12/17/2006  . Diverticulosis 03/27/2007  . GERD 05/01/2007  . HYPERGLYCEMIA 08/27/2007  . HYPERLIPIDEMIA 12/17/2006  . HYPERTENSION 12/17/2006  . Mood disorder (HCC)    hx anxiety and depression  . Rectal polyp 03/27/2007   adenoma    Past Surgical History:  Procedure Laterality Date  . ABDOMINAL HYSTERECTOMY  1978   fibroma  . APPENDECTOMY  2002  . BREAST BIOPSY     negative  . CARPAL TUNNEL RELEASE Left 2010 or 2011  . COLONOSCOPY W/ POLYPECTOMY  03/27/2007; n7/19/2012   2008: 4 mm adenoma, diverticulosis 2012: ileitis ? NSAID - likely, 2-3 mm cecal polyp LYMPHOID FOLLICLE, diverticulosis  . ESOPHAGOGASTRODUODENOSCOPY  01/26/2011   reflux esophagitis, colonscopy done also  . HAMMER TOE SURGERY  2008   left foot  . ORIF ANKLE FRACTURE Right 12/22/2014   Procedure: OPEN REDUCTION INTERNAL FIXATION (ORIF) ANKLE FRACTURE;  Surgeon: Susa Day, MD;  [facility];  Service: Orthopedics;  Laterality: Right;  . SHOULDER OPEN ROTATOR CUFF REPAIR Left 03/06/2013   Procedure: LEFT ROTATOR CUFF REPAIR, SUBACROMIAL DECOMPRESSION, PATCH GRAFT, MANIPULATION UNDER ANESTHESIA;  Surgeon:  Johnn Hai, MD;  [facility];  Service: Orthopedics;  Laterality: Left;    There were no vitals filed for this visit.      Subjective Assessment - 10/02/16 0935    Subjective Pt reports having pain in mid back over the weekend but attributes to weather. Pt reports feeling fine today. Managable amount of pain today.    Pertinent History chronic pain   Limitations Walking   How long can you walk comfortably? a big city block   Patient Stated Goals be able to walk; not have to have to use the wheel chair in the airport   Currently in Pain? Yes   Pain Score 1    Pain Location Back   Pain Orientation Mid;Lower   Pain Descriptors / Indicators Dull   Pain Type Chronic pain   Pain Radiating Towards Down LE   Pain Onset More than a month ago   Pain Frequency Intermittent   Aggravating Factors  walking   Pain Relieving Factors rest   Effect of Pain on Daily Activities walking   Multiple Pain Sites No                         [facility] Adult PT Treatment/Exercise - 10/02/16 0001      Therapeutic Activites    Therapeutic Activities Other Therapeutic Activities  Body mechanics and postural control     Lumbar Exercises: Stretches  Active Hamstring Stretch 3 reps;10 seconds  Seated   Active Hamstring Stretch Limitations Leg lengthener stretch Bil 2x   Lower Trunk Rotation --  20x slowly, then static 3x 20sec   Piriformis Stretch 2 reps;20 seconds  Supine     Lumbar Exercises: Supine   Ab Set --  Ball squeeze, TA contraction 10x, then added glute squeeze10   Clam 20 reps;2 seconds  red band   Bent Knee Raise 20 reps   Bridge 20 reps     Knee/Hip Exercises: Aerobic   Nustep L1 x 7 min  Therapist present to discuss treatment     Knee/Hip Exercises: Standing   Hip Abduction Stengthening;Both;2 sets;10 reps   Hip Extension Stengthening;Both;2 sets;10 reps   Rebounder Weight shifting x3     Knee/Hip Exercises: Seated   Sit to Sand 1 set;10 reps  Body  mechanics     Moist Heat Therapy   Number Minutes Moist Heat --  Conccurent with treatment   Moist Heat Location Lumbar Spine                PT Education - 10/02/16 1007    Education provided Yes   Education Details core and LE strengthening in supine   Person(s) Educated Patient   Methods Explanation;Demonstration;Handout   Comprehension Verbalized understanding          PT Short Term Goals - 10/02/16 0936      PT SHORT TERM GOAL #1   Title independent with initial HEP   Time 4   Period Weeks   Status On-going     PT SHORT TERM GOAL #2   Title able to walk 5 minutes at a time 2x/day to begin walking routine for exercise   Time 4   Period Weeks   Status On-going     PT SHORT TERM GOAL #3   Title hip pain reduced  by 50%   Time 4   Period Weeks   Status On-going           PT Long Term Goals - 10/02/16 1610      PT LONG TERM GOAL #1   Title independent with advanced HEP   Time 8   Period Weeks   Status On-going     PT LONG TERM GOAL #2   Title FOTO < or = to 49%   Time 8   Period Weeks   Status On-going               Plan - 10/02/16 1215    Clinical Impression Statement Pt reports having increased pain over weekend but better today. Pt able to tolerate all standing exercises well with some fatigue. Pt will continue to benefit from skilled thearpy for core stability and extremity strengthening.    Rehab Potential Excellent   Clinical Impairments Affecting Rehab Potential chronic pain, history of abdominal hysterectomy   PT Frequency 2x / week   PT Duration 8 weeks   PT Treatment/Interventions ADLs/Self Care Home Management;Biofeedback;Cryotherapy;Electrical Stimulation;Iontophoresis '4mg'$ /ml Dexamethasone;Moist Heat;Traction;Ultrasound;Gait training;Stair training;Functional mobility training;Therapeutic activities;Therapeutic exercise;Balance training;Neuromuscular re-education;Patient/family education;Manual techniques;Passive range of  motion;Dry needling;Taping   PT Next Visit Plan Standing exercies as toelrated, endurance   Consulted and Agree with Plan of Care Patient      Patient will benefit from skilled therapeutic intervention in order to improve the following deficits and impairments:  Abnormal gait, Decreased activity tolerance, Decreased mobility, Decreased range of motion, Decreased endurance, Difficulty walking, Decreased strength, Increased muscle spasms, Pain, Postural dysfunction  Visit Diagnosis: Muscle weakness (generalized)  Acute bilateral low back pain, with sciatica presence unspecified  Difficulty in walking, not elsewhere classified     Problem List Patient Active Problem List   Diagnosis Date Noted  . Chronic rhinitis 01/24/2016  . Open right ankle fracture 12/22/2014  . COPD (chronic obstructive pulmonary disease) ([place]) 11/18/2014  . Iron deficiency anemia, unspecified 01/20/2011  . Arthropathy 02/23/2010  . HYPERGLYCEMIA 08/27/2007  . GERD 05/01/2007  . Hyperlipemia 12/17/2006  . Essential hypertension 12/17/2006    Mikle Bosworth PTA 10/02/2016, 12:19 PM  [facility] [address], [place], [place], [address] Phone: [phone]   Fax:  [phone]  Name: Sheila Shields MRN: [id number] Date of Birth: 1934-10-27

## 2016-10-02 NOTE — Patient Instructions (Signed)
Adduction: Hip - Knees Together (Hook-Lying)    Lie with hips and knees bent, towel roll between knees. Push knees together. Hold for _2__ seconds. Rest for _2__ seconds. Repeat _10__ times. Do _1__ times a day.   Copyright  VHI. All rights reserved.   Bridge [employer] small of back into mat, maintain pelvic tilt, roll up one vertebrae at a time. Focus on engaging posterior hip muscles. Hold for __2__ breaths. Repeat __10_ times.  Copyright  VHI. All rights reserved.     Mikle Bosworth, PTA 10/02/16 10:06 AM  [facility] [address] [place], [place] [address] Phone # [phone] Fax [phone]

## 2016-10-04 ENCOUNTER — Encounter: Payer: Self-pay | Admitting: Physical Therapy

## 2016-10-04 ENCOUNTER — Ambulatory Visit: Payer: Medicare HMO | Admitting: Physical Therapy

## 2016-10-04 DIAGNOSIS — M6281 Muscle weakness (generalized): Secondary | ICD-10-CM | POA: Diagnosis not present

## 2016-10-04 DIAGNOSIS — R262 Difficulty in walking, not elsewhere classified: Secondary | ICD-10-CM

## 2016-10-04 DIAGNOSIS — M545 Low back pain: Secondary | ICD-10-CM

## 2016-10-04 NOTE — Therapy (Signed)
[facility] [address], [place], [place], [address] Phone: [phone]   Fax:  [phone]  Physical Therapy Treatment  Patient Details  Name: Sheila Shields MRN: [id number] Date of Birth: [birth year] Referring Provider: Lucretia Kern, DO  Encounter Date: 10/04/2016      PT End of Session - 10/04/16 1404    Visit Number 5   Number of Visits 10   Date for PT Re-Evaluation 11/17/16   Authorization Type gcodes 10th visit; KX 15   PT Start Time 1402   PT Stop Time 1443   PT Time Calculation (min) 41 min   Activity Tolerance Patient tolerated treatment well   Behavior During Therapy [facility] for tasks assessed/performed      Past Medical History:  Diagnosis Date  . Alcoholism in remission ([place]) 08/27/2007  . Anemia   . Arthritis    OA  . COPD 12/17/2006  . Diverticulosis 03/27/2007  . GERD 05/01/2007  . HYPERGLYCEMIA 08/27/2007  . HYPERLIPIDEMIA 12/17/2006  . HYPERTENSION 12/17/2006  . Mood disorder (HCC)    hx anxiety and depression  . Rectal polyp 03/27/2007   adenoma    Past Surgical History:  Procedure Laterality Date  . ABDOMINAL HYSTERECTOMY  1978   fibroma  . APPENDECTOMY  2002  . BREAST BIOPSY     negative  . CARPAL TUNNEL RELEASE Left 2010 or 2011  . COLONOSCOPY W/ POLYPECTOMY  03/27/2007; n7/19/2012   2008: 4 mm adenoma, diverticulosis 2012: ileitis ? NSAID - likely, 2-3 mm cecal polyp LYMPHOID FOLLICLE, diverticulosis  . ESOPHAGOGASTRODUODENOSCOPY  01/26/2011   reflux esophagitis, colonscopy done also  . HAMMER TOE SURGERY  2008   left foot  . ORIF ANKLE FRACTURE Right 12/22/2014   Procedure: OPEN REDUCTION INTERNAL FIXATION (ORIF) ANKLE FRACTURE;  Surgeon: Susa Day, MD;  Location: [facility];  Service: Orthopedics;  Laterality: Right;  . SHOULDER OPEN ROTATOR CUFF REPAIR Left 03/06/2013   Procedure: LEFT ROTATOR CUFF REPAIR, SUBACROMIAL DECOMPRESSION, PATCH GRAFT, MANIPULATION UNDER ANESTHESIA;  Surgeon:  Johnn Hai, MD;  Location: [facility];  Service: Orthopedics;  Laterality: Left;    There were no vitals filed for this visit.      Subjective Assessment - 10/04/16 1406    Subjective My back is managable. It's not 100% but it's managable. What's keeping me up is the arthritis in my hands.    Pertinent History chronic pain   Limitations Walking   How long can you walk comfortably? a big city block   Patient Stated Goals be able to walk; not have to have to use the wheel chair in the airport                         [facility] Adult PT Treatment/Exercise - 10/04/16 0001      Therapeutic Activites    Therapeutic Activities Other Therapeutic Activities  Body mechanics and postural control     Lumbar Exercises: Stretches   Active Hamstring Stretch 3 reps;10 seconds  Seated   Active Hamstring Stretch Limitations Leg lengthener stretch Bil 2x   Single Knee to Chest Stretch 3 reps;20 seconds   Lower Trunk Rotation --  20x slowly, then static 3x 20sec   Piriformis Stretch 2 reps;20 seconds  Supine     Lumbar Exercises: Supine   Ab Set --  Ball squeeze, TA contraction 10x, then added glute squeeze10   Heel Slides 20 reps   Bent Knee Raise 20  reps   Bridge 20 reps     Knee/Hip Exercises: Aerobic   Nustep L1 x 7 min  Therapist present to discuss treatment     Knee/Hip Exercises: Standing   Hip Abduction Stengthening;Both;2 sets;10 reps   Hip Extension Stengthening;Both;2 sets;10 reps   Rebounder Weight shifting x3     Knee/Hip Exercises: Seated   Sit to Sand 1 set;10 reps  Body mechanics     Knee/Hip Exercises: Sidelying   Clams x10 Bil     Moist Heat Therapy   Number Minutes Moist Heat --  Concurrent with treatment   Moist Heat Location Lumbar Spine                  PT Short Term Goals - 10/04/16 1404      PT SHORT TERM GOAL #1   Title independent with initial HEP   Time 4   Period Weeks   Status Achieved     PT SHORT TERM GOAL #2    Title able to walk 5 minutes at a time 2x/day to begin walking routine for exercise   Time 4   Period Weeks   Status On-going           PT Long Term Goals - 10/02/16 3299      PT LONG TERM GOAL #1   Title independent with advanced HEP   Time 8   Period Weeks   Status On-going     PT LONG TERM GOAL #2   Title FOTO < or = to 49%   Time 8   Period Weeks   Status On-going               Plan - 10/04/16 1521    Clinical Impression Statement Pt continues to progress with strengthening and core stability. Reports have managable amount of back pain and hips doing well too. Pt continues to have decreased endurance and strength, especially with standing exercises. Pt will continue to benefit from skilled thearpy for LE and core strength and endurance.    Rehab Potential Excellent   Clinical Impairments Affecting Rehab Potential chronic pain, history of abdominal hysterectomy   PT Frequency 2x / week   PT Duration 8 weeks   PT Treatment/Interventions ADLs/Self Care Home Management;Biofeedback;Cryotherapy;Electrical Stimulation;Iontophoresis '4mg'$ /ml Dexamethasone;Moist Heat;Traction;Ultrasound;Gait training;Stair training;Functional mobility training;Therapeutic activities;Therapeutic exercise;Balance training;Neuromuscular re-education;Patient/family education;Manual techniques;Passive range of motion;Dry needling;Taping   PT Next Visit Plan Standing exercies as toelrated, endurance   Consulted and Agree with Plan of Care Patient      Patient will benefit from skilled therapeutic intervention in order to improve the following deficits and impairments:  Abnormal gait, Decreased activity tolerance, Decreased mobility, Decreased range of motion, Decreased endurance, Difficulty walking, Decreased strength, Increased muscle spasms, Pain, Postural dysfunction  Visit Diagnosis: Muscle weakness (generalized)  Acute bilateral low back pain, with sciatica presence unspecified  Difficulty  in walking, not elsewhere classified     Problem List Patient Active Problem List   Diagnosis Date Noted  . Chronic rhinitis 01/24/2016  . Open right ankle fracture 12/22/2014  . COPD (chronic obstructive pulmonary disease) ([place]) 11/18/2014  . Iron deficiency anemia, unspecified 01/20/2011  . Arthropathy 02/23/2010  . HYPERGLYCEMIA 08/27/2007  . GERD 05/01/2007  . Hyperlipemia 12/17/2006  . Essential hypertension 12/17/2006    Mikle Bosworth PTA 10/04/2016, 3:23 PM  [facility] [address], [place], [place], [address] Phone: [phone]   Fax:  [phone]  Name: Sheila Shields MRN: [id number]  Date of Birth: 30-May-1935

## 2016-10-05 DIAGNOSIS — H11153 Pinguecula, bilateral: Secondary | ICD-10-CM | POA: Diagnosis not present

## 2016-10-05 DIAGNOSIS — H04123 Dry eye syndrome of bilateral lacrimal glands: Secondary | ICD-10-CM | POA: Diagnosis not present

## 2016-10-05 DIAGNOSIS — Z961 Presence of intraocular lens: Secondary | ICD-10-CM | POA: Diagnosis not present

## 2016-10-05 DIAGNOSIS — H43813 Vitreous degeneration, bilateral: Secondary | ICD-10-CM | POA: Diagnosis not present

## 2016-10-11 ENCOUNTER — Encounter: Payer: Self-pay | Admitting: Physical Therapy

## 2016-10-11 ENCOUNTER — Ambulatory Visit: Payer: Medicare HMO | Attending: Family Medicine | Admitting: Physical Therapy

## 2016-10-11 DIAGNOSIS — M545 Low back pain: Secondary | ICD-10-CM

## 2016-10-11 DIAGNOSIS — R262 Difficulty in walking, not elsewhere classified: Secondary | ICD-10-CM

## 2016-10-11 DIAGNOSIS — M6281 Muscle weakness (generalized): Secondary | ICD-10-CM | POA: Insufficient documentation

## 2016-10-11 NOTE — Therapy (Signed)
[facility] [address], [place], [place], [address] Phone: [phone]   Fax:  [phone]  Physical Therapy Treatment  Patient Details  Name: Sheila Shields MRN: [id number] Date of Birth: May 07, 1935 Referring Provider: Lucretia Kern, DO  Encounter Date: 10/11/2016      PT End of Session - 10/11/16 0848    Visit Number 6   Number of Visits 10   Date for PT Re-Evaluation 11/17/16   Authorization Type gcodes 10th visit; KX 15   PT Start Time 0846   PT Stop Time 0927   PT Time Calculation (min) 41 min   Activity Tolerance Patient tolerated treatment well   Behavior During Therapy [facility] for tasks assessed/performed      Past Medical History:  Diagnosis Date  . Alcoholism in remission ([place]) 08/27/2007  . Anemia   . Arthritis    OA  . COPD 12/17/2006  . Diverticulosis 03/27/2007  . GERD 05/01/2007  . HYPERGLYCEMIA 08/27/2007  . HYPERLIPIDEMIA 12/17/2006  . HYPERTENSION 12/17/2006  . Mood disorder (HCC)    hx anxiety and depression  . Rectal polyp 03/27/2007   adenoma    Past Surgical History:  Procedure Laterality Date  . ABDOMINAL HYSTERECTOMY  1978   fibroma  . APPENDECTOMY  2002  . BREAST BIOPSY     negative  . CARPAL TUNNEL RELEASE Left 2010 or 2011  . COLONOSCOPY W/ POLYPECTOMY  03/27/2007; n7/19/2012   2008: 4 mm adenoma, diverticulosis 2012: ileitis ? NSAID - likely, 2-3 mm cecal polyp LYMPHOID FOLLICLE, diverticulosis  . ESOPHAGOGASTRODUODENOSCOPY  01/26/2011   reflux esophagitis, colonscopy done also  . HAMMER TOE SURGERY  2008   left foot  . ORIF ANKLE FRACTURE Right 12/22/2014   Procedure: OPEN REDUCTION INTERNAL FIXATION (ORIF) ANKLE FRACTURE;  Surgeon: Susa Day, MD;  [facility]: [facility];  Service: Orthopedics;  Laterality: Right;  . SHOULDER OPEN ROTATOR CUFF REPAIR Left 03/06/2013   Procedure: LEFT ROTATOR CUFF REPAIR, SUBACROMIAL DECOMPRESSION, PATCH GRAFT, MANIPULATION UNDER ANESTHESIA;  Surgeon:  Johnn Hai, MD;  Location: [facility];  Service: Orthopedics;  Laterality: Left;    There were no vitals filed for this visit.      Subjective Assessment - 10/11/16 0849    Subjective My back was good this weekend. It's better than it has been I'll put it that way. I was pretty much pain free this morning but I still take Tylenol because as the day goes on my back gets worse.    Pertinent History chronic pain   Limitations Walking   How long can you walk comfortably? a big city block   Patient Stated Goals be able to walk; not have to have to use the wheel chair in the airport   Currently in Pain? No/denies   Pain Score 0-No pain                         [facility] Adult PT Treatment/Exercise - 10/11/16 0001      Therapeutic Activites    Therapeutic Activities Other Therapeutic Activities  Body mechanics and postural control     Lumbar Exercises: Stretches   Active Hamstring Stretch 3 reps;10 seconds  Supine   Active Hamstring Stretch Limitations Leg lengthener stretch Bil 2x   Single Knee to Chest Stretch 3 reps;20 seconds   Lower Trunk Rotation --  20x slowly, then static 3x 20sec   Piriformis Stretch 2 reps;20 seconds  Supine  Lumbar Exercises: Supine   Bridge 20 reps     Knee/Hip Exercises: Aerobic   Nustep L2 x 10 min  Therapist present to discuss treatment     Knee/Hip Exercises: Standing   Knee Flexion Strengthening;Both;2 sets;10 reps  Hamstring curls   Hip Abduction Stengthening;Both;2 sets;10 reps   Hip Extension Stengthening;Both;2 sets;10 reps   Forward Step Up 2 sets;10 reps;Both   Rebounder Weight shifting x3     Knee/Hip Exercises: Seated   Sit to Sand 1 set;10 reps  Body mechanics     Moist Heat Therapy   Number Minutes Moist Heat --  Conccurent with treatment   Moist Heat Location Lumbar Spine     Manual Therapy   Manual Therapy Soft tissue mobilization;Myofascial release   Manual therapy comments Pt prone   Soft tissue  mobilization to Lt glutes, along SI joint                  PT Short Term Goals - 10/11/16 0849      PT SHORT TERM GOAL #2   Title able to walk 5 minutes at a time 2x/day to begin walking routine for exercise   Time 4   Period Weeks   Status On-going     PT SHORT TERM GOAL #3   Title hip pain reduced  by 50%   Baseline 40%   Time 4   Period Weeks   Status On-going           PT Long Term Goals - 10/11/16 7673      PT LONG TERM GOAL #3   Title pt will improve endurance to tolerate walking 15 minutes at a time for walking in community   Time 8   Period Weeks   Status On-going     PT LONG TERM GOAL #4   Title improve hip strength to 4/5 bilaterally  for improved gait and functional mobility and transfers.   Time 8   Period Weeks   Status On-going               Plan - 10/11/16 4193    Clinical Impression Statement Pt able to tolerate increased resistance and time on Nustep. Able to tolerate all standing exercises well with some increase hip pain in Lt weight bearing. Pt reports therapy has helped a lot but is worried about her upcoming trip to [place] where she will have to walk for long periods of time. Pt will continue to benefit from skilled therapy for strength and endurance.    Rehab Potential Excellent   Clinical Impairments Affecting Rehab Potential chronic pain, history of abdominal hysterectomy   PT Frequency 2x / week   PT Duration 8 weeks   PT Treatment/Interventions ADLs/Self Care Home Management;Biofeedback;Cryotherapy;Electrical Stimulation;Iontophoresis '4mg'$ /ml Dexamethasone;Moist Heat;Traction;Ultrasound;Gait training;Stair training;Functional mobility training;Therapeutic activities;Therapeutic exercise;Balance training;Neuromuscular re-education;Patient/family education;Manual techniques;Passive range of motion;Dry needling;Taping   PT Next Visit Plan Standing exercies as toelrated, endurance   Consulted and Agree with Plan of Care Patient       Patient will benefit from skilled therapeutic intervention in order to improve the following deficits and impairments:  Abnormal gait, Decreased activity tolerance, Decreased mobility, Decreased range of motion, Decreased endurance, Difficulty walking, Decreased strength, Increased muscle spasms, Pain, Postural dysfunction  Visit Diagnosis: Muscle weakness (generalized)  Acute bilateral low back pain, with sciatica presence unspecified  Difficulty in walking, not elsewhere classified     Problem List Patient Active Problem List   Diagnosis Date Noted  . Chronic rhinitis  01/24/2016  . Open right ankle fracture 12/22/2014  . COPD (chronic obstructive pulmonary disease) ([place]) 11/18/2014  . Iron deficiency anemia, unspecified 01/20/2011  . Arthropathy 02/23/2010  . HYPERGLYCEMIA 08/27/2007  . GERD 05/01/2007  . Hyperlipemia 12/17/2006  . Essential hypertension 12/17/2006    Mikle Bosworth PTA 10/11/2016, 9:54 AM  [facility] [address] [place], [place], [address] Phone: [phone]   Fax:  [phone]  Name: MALAIYAH ACHORN MRN: [id number] Date of Birth: Nov 15, 1934

## 2016-10-13 ENCOUNTER — Encounter: Payer: Self-pay | Admitting: Physical Therapy

## 2016-10-13 ENCOUNTER — Ambulatory Visit: Payer: Medicare HMO | Admitting: Physical Therapy

## 2016-10-13 DIAGNOSIS — M545 Low back pain: Secondary | ICD-10-CM

## 2016-10-13 DIAGNOSIS — M6281 Muscle weakness (generalized): Secondary | ICD-10-CM | POA: Diagnosis not present

## 2016-10-13 DIAGNOSIS — R262 Difficulty in walking, not elsewhere classified: Secondary | ICD-10-CM

## 2016-10-13 NOTE — Therapy (Signed)
[facility] [address], [place], [place], [address] Phone: [phone]   Fax:  [phone]  Physical Therapy Treatment  Patient Details  Name: Sheila Shields MRN: [id number] Date of Birth: Nov 27, 1934 Referring Provider: Lucretia Kern, DO  Encounter Date: 10/13/2016      PT End of Session - 10/13/16 0847    Visit Number 7   Number of Visits 10   Date for PT Re-Evaluation 11/17/16   Authorization Type gcodes 10th visit; KX 15   PT Start Time 0845   PT Stop Time 0925   PT Time Calculation (min) 40 min   Activity Tolerance Patient tolerated treatment well   Behavior During Therapy [facility] for tasks assessed/performed      Past Medical History:  Diagnosis Date  . Alcoholism in remission ([place]) 08/27/2007  . Anemia   . Arthritis    OA  . COPD 12/17/2006  . Diverticulosis 03/27/2007  . GERD 05/01/2007  . HYPERGLYCEMIA 08/27/2007  . HYPERLIPIDEMIA 12/17/2006  . HYPERTENSION 12/17/2006  . Mood disorder (HCC)    hx anxiety and depression  . Rectal polyp 03/27/2007   adenoma    Past Surgical History:  Procedure Laterality Date  . ABDOMINAL HYSTERECTOMY  1978   fibroma  . APPENDECTOMY  2002  . BREAST BIOPSY     negative  . CARPAL TUNNEL RELEASE Left 2010 or 2011  . COLONOSCOPY W/ POLYPECTOMY  03/27/2007; n7/19/2012   2008: 4 mm adenoma, diverticulosis 2012: ileitis ? NSAID - likely, 2-3 mm cecal polyp LYMPHOID FOLLICLE, diverticulosis  . ESOPHAGOGASTRODUODENOSCOPY  01/26/2011   reflux esophagitis, colonscopy done also  . HAMMER TOE SURGERY  2008   left foot  . ORIF ANKLE FRACTURE Right 12/22/2014   Procedure: OPEN REDUCTION INTERNAL FIXATION (ORIF) ANKLE FRACTURE;  Surgeon: Susa Day, MD;  [facility]: [facility];  Service: Orthopedics;  Laterality: Right;  . SHOULDER OPEN ROTATOR CUFF REPAIR Left 03/06/2013   Procedure: LEFT ROTATOR CUFF REPAIR, SUBACROMIAL DECOMPRESSION, PATCH GRAFT, MANIPULATION UNDER ANESTHESIA;  Surgeon:  Johnn Hai, MD;  [facility]: [facility];  Service: Orthopedics;  Laterality: Left;    There were no vitals filed for this visit.      Subjective Assessment - 10/13/16 0851    Subjective Back doing good until I stand for any period of time   Pertinent History chronic pain   Limitations Walking   How long can you walk comfortably? a big city block   Patient Stated Goals be able to walk; not have to have to use the wheel chair in the airport   Currently in Pain? No/denies   Pain Score 0-No pain                         [facility] Adult PT Treatment/Exercise - 10/13/16 0001      Therapeutic Activites    Therapeutic Activities Other Therapeutic Activities  Body mechanics and postural control     Lumbar Exercises: Stretches   Active Hamstring Stretch 3 reps;10 seconds  Supine   Active Hamstring Stretch Limitations Leg lengthener stretch Bil 2x   Lower Trunk Rotation --  20x slowly, then static 3x 20sec   Piriformis Stretch 2 reps;20 seconds  Supine     Lumbar Exercises: Supine   Ab Set 15 reps  Ball squeeze, TA contractio, then added glute squeeze   Bridge 20 reps     Knee/Hip Exercises: Aerobic   Nustep L2 x 7 min  Therapist present to discuss treatment     Knee/Hip Exercises: Standing   Knee Flexion Strengthening;Both;2 sets;10 reps  Hamstring curls   Hip Abduction Stengthening;Both;2 sets;10 reps   Hip Extension Stengthening;Both;2 sets;10 reps   Forward Step Up 2 sets;10 reps;Both   Rebounder Weight shifting x3     Knee/Hip Exercises: Seated   Sit to Sand 1 set;10 reps  Body mechanics                  PT Short Term Goals - 10/11/16 0849      PT SHORT TERM GOAL #2   Title able to walk 5 minutes at a time 2x/day to begin walking routine for exercise   Time 4   Period Weeks   Status On-going     PT SHORT TERM GOAL #3   Title hip pain reduced  by 50%   Baseline 40%   Time 4   Period Weeks   Status On-going           PT Long Term  Goals - 10/11/16 7169      PT LONG TERM GOAL #3   Title pt will improve endurance to tolerate walking 15 minutes at a time for walking in community   Time 8   Period Weeks   Status On-going     PT LONG TERM GOAL #4   Title improve hip strength to 4/5 bilaterally  for improved gait and functional mobility and transfers.   Time 8   Period Weeks   Status On-going               Plan - 10/13/16 0903    Clinical Impression Statement Pt continues to do well with all standing exercises with minimal hip discomfort. Discussed with patient PREP program for after therapy so patient can continue to progress with strength.    Rehab Potential Excellent   Clinical Impairments Affecting Rehab Potential chronic pain, history of abdominal hysterectomy   PT Frequency 2x / week   PT Duration 8 weeks   PT Treatment/Interventions ADLs/Self Care Home Management;Biofeedback;Cryotherapy;Electrical Stimulation;Iontophoresis '4mg'$ /ml Dexamethasone;Moist Heat;Traction;Ultrasound;Gait training;Stair training;Functional mobility training;Therapeutic activities;Therapeutic exercise;Balance training;Neuromuscular re-education;Patient/family education;Manual techniques;Passive range of motion;Dry needling;Taping   PT Next Visit Plan Standing exercies as toelrated, endurance   Consulted and Agree with Plan of Care Patient      Patient will benefit from skilled therapeutic intervention in order to improve the following deficits and impairments:  Abnormal gait, Decreased activity tolerance, Decreased mobility, Decreased range of motion, Decreased endurance, Difficulty walking, Decreased strength, Increased muscle spasms, Pain, Postural dysfunction  Visit Diagnosis: Muscle weakness (generalized)  Acute bilateral low back pain, with sciatica presence unspecified  Difficulty in walking, not elsewhere classified     Problem List Patient Active Problem List   Diagnosis Date Noted  . Chronic rhinitis 01/24/2016   . Open right ankle fracture 12/22/2014  . COPD (chronic obstructive pulmonary disease) ([place]) 11/18/2014  . Iron deficiency anemia, unspecified 01/20/2011  . Arthropathy 02/23/2010  . HYPERGLYCEMIA 08/27/2007  . GERD 05/01/2007  . Hyperlipemia 12/17/2006  . Essential hypertension 12/17/2006    Sheila Shields PTA 10/13/2016, 9:24 AM  [facility] [address] [place], [place], [address] Phone: [phone]   Fax:  [phone]  Name: Sheila Shields MRN: [id number] Date of Birth: 07/23/1934

## 2016-10-18 ENCOUNTER — Ambulatory Visit: Payer: Medicare HMO | Admitting: Physical Therapy

## 2016-10-18 ENCOUNTER — Encounter: Payer: Self-pay | Admitting: Physical Therapy

## 2016-10-18 DIAGNOSIS — M545 Low back pain: Secondary | ICD-10-CM

## 2016-10-18 DIAGNOSIS — M6281 Muscle weakness (generalized): Secondary | ICD-10-CM | POA: Diagnosis not present

## 2016-10-18 DIAGNOSIS — R262 Difficulty in walking, not elsewhere classified: Secondary | ICD-10-CM

## 2016-10-18 NOTE — Therapy (Signed)
[facility] [address] [place], [place], [address] Phone: [phone]   Fax:  [phone]  Physical Therapy Treatment  Patient Details  Name: Sheila Shields MRN: [id number] Date of Birth: May 17, 1935 Referring Provider: Lucretia Kern, DO  Encounter Date: 10/18/2016      PT End of Session - 10/18/16 1233    Visit Number 8   Number of Visits 10   Date for PT Re-Evaluation 11/17/16   Authorization Type gcodes 10th visit; KX 15   PT Start Time 1230   PT Stop Time 1308   PT Time Calculation (min) 38 min   Activity Tolerance Patient tolerated treatment well   Behavior During Therapy [facility] for tasks assessed/performed      Past Medical History:  Diagnosis Date  . Alcoholism in remission ([place]) 08/27/2007  . Anemia   . Arthritis    OA  . COPD 12/17/2006  . Diverticulosis 03/27/2007  . GERD 05/01/2007  . HYPERGLYCEMIA 08/27/2007  . HYPERLIPIDEMIA 12/17/2006  . HYPERTENSION 12/17/2006  . Mood disorder (HCC)    hx anxiety and depression  . Rectal polyp 03/27/2007   adenoma    Past Surgical History:  Procedure Laterality Date  . ABDOMINAL HYSTERECTOMY  1978   fibroma  . APPENDECTOMY  2002  . BREAST BIOPSY     negative  . CARPAL TUNNEL RELEASE Left 2010 or 2011  . COLONOSCOPY W/ POLYPECTOMY  03/27/2007; n7/19/2012   2008: 4 mm adenoma, diverticulosis 2012: ileitis ? NSAID - likely, 2-3 mm cecal polyp LYMPHOID FOLLICLE, diverticulosis  . ESOPHAGOGASTRODUODENOSCOPY  01/26/2011   reflux esophagitis, colonscopy done also  . HAMMER TOE SURGERY  2008   left foot  . ORIF ANKLE FRACTURE Right 12/22/2014   Procedure: OPEN REDUCTION INTERNAL FIXATION (ORIF) ANKLE FRACTURE;  Surgeon: Susa Day, MD;  Location: [facility];  Service: Orthopedics;  Laterality: Right;  . SHOULDER OPEN ROTATOR CUFF REPAIR Left 03/06/2013   Procedure: LEFT ROTATOR CUFF REPAIR, SUBACROMIAL DECOMPRESSION, PATCH GRAFT, MANIPULATION UNDER ANESTHESIA;  Surgeon:  Johnn Hai, MD;  Location: [facility];  Service: Orthopedics;  Laterality: Left;    There were no vitals filed for this visit.      Subjective Assessment - 10/18/16 1231    Subjective Feels about the same. Some days I can walk some days I can't. Some days I can do things with no pain and some days it hurts.    Pertinent History chronic pain   Limitations Walking   How long can you walk comfortably? a big city block   Patient Stated Goals be able to walk; not have to have to [place] the airport   Currently in Pain? No/denies   Pain Score 0-No pain                         [facility] Adult PT Treatment/Exercise - 10/18/16 0001      Therapeutic Activites    Therapeutic Activities Other Therapeutic Activities  Body mechanics and postural control     Lumbar Exercises: Stretches   Active Hamstring Stretch 3 reps;10 seconds  Supine   Active Hamstring Stretch Limitations Leg lengthener stretch Bil 2x   Lower Trunk Rotation --  20x slowly, then static 3x 20sec   Piriformis Stretch 2 reps;20 seconds  Supine     Knee/Hip Exercises: Aerobic   Nustep L3 x 10 min  Therapist present to discuss treatment     Knee/Hip  Exercises: Standing   Knee Flexion Strengthening;Both;2 sets;10 reps  Hamstring curls   Hip Flexion Stengthening;Both;2 sets;10 reps   Hip Abduction Stengthening;Both;2 sets;10 reps   Hip Extension Stengthening;Both;2 sets;10 reps   Forward Step Up 2 sets;10 reps;Both   Rebounder Weight shifting x3     Knee/Hip Exercises: Seated   Ball Squeeze x20   Clamshell with TheraBand Red   Sit to Sand 2 sets;10 reps     Moist Heat Therapy   Number Minutes Moist Heat --  While patient on Nustep   Moist Heat Location Lumbar Spine                  PT Short Term Goals - 10/18/16 1233      PT SHORT TERM GOAL #2   Title able to walk 5 minutes at a time 2x/day to begin walking routine for exercise   Time 4   Period Weeks   Status  Achieved     PT SHORT TERM GOAL #3   Title hip pain reduced  by 50%   Baseline 40%   Time 4   Period Weeks   Status On-going           PT Long Term Goals - 10/18/16 1303      PT LONG TERM GOAL #1   Baseline Patient is planning to progress to [facility].R[facility]P. program at [facility]   Time 8   Period Weeks   Status Achieved     PT LONG TERM GOAL #3   Title pt will improve endurance to tolerate walking 15 minutes at a time for walking in community   Baseline 5 minute max   Time 8   Period Weeks   Status On-going     PT LONG TERM GOAL #4   Title improve hip strength to 4/5 bilaterally  for improved gait and functional mobility and transfers.   Time 8   Period Weeks   Status On-going               Plan - 10/18/16 1305    Clinical Impression Statement Pt able to tolerate all exercises well. Continues to have pain in hip with single leg standing and wlaking for more than 5 minutes. Pt has been walking for exercise at home but is limited to 5 minutes before pain begins. Put in referral for P.R.E.P. program today and patient is planning to continue with strengthening after therapy. Pt wanting to be discharged from therapy at next visit with PT on Monday. Will continue to progress strengthening and balance.    Rehab Potential Excellent   Clinical Impairments Affecting Rehab Potential chronic pain, history of abdominal hysterectomy   PT Frequency 2x / week   PT Duration 8 weeks   PT Treatment/Interventions ADLs/Self Care Home Management;Biofeedback;Cryotherapy;Electrical Stimulation;Iontophoresis '4mg'$ /ml Dexamethasone;Moist Heat;Traction;Ultrasound;Gait training;Stair training;Functional mobility training;Therapeutic activities;Therapeutic exercise;Balance training;Neuromuscular re-education;Patient/family education;Manual techniques;Passive range of motion;Dry needling;Taping   PT Next Visit Plan Supine 4 direction SLR, standing exercises   Consulted and Agree with Plan of Care Patient       Patient will benefit from skilled therapeutic intervention in order to improve the following deficits and impairments:  Abnormal gait, Decreased activity tolerance, Decreased mobility, Decreased range of motion, Decreased endurance, Difficulty walking, Decreased strength, Increased muscle spasms, Pain, Postural dysfunction  Visit Diagnosis: Muscle weakness (generalized)  Acute bilateral low back pain, with sciatica presence unspecified  Difficulty in walking, not elsewhere classified     Problem List Patient Active Problem List   Diagnosis  Date Noted  . Chronic rhinitis 01/24/2016  . Open right ankle fracture 12/22/2014  . COPD (chronic obstructive pulmonary disease) ([place]) 11/18/2014  . Iron deficiency anemia, unspecified 01/20/2011  . Arthropathy 02/23/2010  . HYPERGLYCEMIA 08/27/2007  . GERD 05/01/2007  . Hyperlipemia 12/17/2006  . Essential hypertension 12/17/2006    Mikle Bosworth PTA 10/18/2016, 4:54 PM  [facility] [address] [place], [place], [address] Phone: [phone]   Fax:  [phone]  Name: Sheila Shields MRN: [id number] Date of Birth: 1934-10-14

## 2016-10-20 ENCOUNTER — Encounter: Payer: Self-pay | Admitting: Physical Therapy

## 2016-10-20 ENCOUNTER — Ambulatory Visit: Payer: Medicare HMO | Admitting: Physical Therapy

## 2016-10-20 DIAGNOSIS — M6281 Muscle weakness (generalized): Secondary | ICD-10-CM

## 2016-10-20 DIAGNOSIS — M545 Low back pain: Secondary | ICD-10-CM

## 2016-10-20 DIAGNOSIS — R262 Difficulty in walking, not elsewhere classified: Secondary | ICD-10-CM

## 2016-10-20 NOTE — Therapy (Signed)
[facility] [address], [place], [place], [address] Phone: [phone]   Fax:  [phone]  Physical Therapy Treatment  Patient Details  Name: Sheila Shields MRN: [id number] Date of Birth: Sep 23, 1934 Referring Provider: Lucretia Kern, DO  Encounter Date: 10/20/2016      PT End of Session - 10/20/16 0848    Visit Number 9   Number of Visits 10   Date for PT Re-Evaluation 11/17/16   Authorization Type gcodes 10th visit; KX 15   PT Start Time 0845   PT Stop Time 0924   PT Time Calculation (min) 39 min   Activity Tolerance Patient tolerated treatment well   Behavior During Therapy [facility] for tasks assessed/performed      Past Medical History:  Diagnosis Date  . Alcoholism in remission ([place]) 08/27/2007  . Anemia   . Arthritis    OA  . COPD 12/17/2006  . Diverticulosis 03/27/2007  . GERD 05/01/2007  . HYPERGLYCEMIA 08/27/2007  . HYPERLIPIDEMIA 12/17/2006  . HYPERTENSION 12/17/2006  . Mood disorder (HCC)    hx anxiety and depression  . Rectal polyp 03/27/2007   adenoma    Past Surgical History:  Procedure Laterality Date  . ABDOMINAL HYSTERECTOMY  1978   fibroma  . APPENDECTOMY  2002  . BREAST BIOPSY     negative  . CARPAL TUNNEL RELEASE Left 2010 or 2011  . COLONOSCOPY W/ POLYPECTOMY  03/27/2007; n7/19/2012   2008: 4 mm adenoma, diverticulosis 2012: ileitis ? NSAID - likely, 2-3 mm cecal polyp LYMPHOID FOLLICLE, diverticulosis  . ESOPHAGOGASTRODUODENOSCOPY  01/26/2011   reflux esophagitis, colonscopy done also  . HAMMER TOE SURGERY  2008   left foot  . ORIF ANKLE FRACTURE Right 12/22/2014   Procedure: OPEN REDUCTION INTERNAL FIXATION (ORIF) ANKLE FRACTURE;  Surgeon: Susa Day, MD;  [facility];  Service: Orthopedics;  Laterality: Right;  . SHOULDER OPEN ROTATOR CUFF REPAIR Left 03/06/2013   Procedure: LEFT ROTATOR CUFF REPAIR, SUBACROMIAL DECOMPRESSION, PATCH GRAFT, MANIPULATION UNDER ANESTHESIA;  Surgeon:  Johnn Hai, MD;  [facility];  Service: Orthopedics;  Laterality: Left;    There were no vitals filed for this visit.      Subjective Assessment - 10/20/16 0848    Subjective Everything feeling well today.    Pertinent History chronic pain   Limitations Walking   How long can you walk comfortably? a big city block   Patient Stated Goals be able to walk; not have to have to use the wheel chair in the airport   Currently in Pain? No/denies   Pain Score 0-No pain                         [facility] Adult PT Treatment/Exercise - 10/20/16 0001      Therapeutic Activites    Therapeutic Activities Other Therapeutic Activities  Body mechanics and postural control     Lumbar Exercises: Stretches   Active Hamstring Stretch 3 reps;10 seconds  Supine   Active Hamstring Stretch Limitations Leg lengthener stretch Bil 2x   Lower Trunk Rotation --  20x slowly, then static 3x 20sec   Piriformis Stretch 2 reps;20 seconds  Supine     Lumbar Exercises: Supine   Ab Set 20 reps  Ball squeeze, TA contractio, then added glute squeeze   Heel Slides 20 reps  Bil knees to chest with ball   Straight Leg Raise 20 reps   Large Ball Oblique Isometric  10 reps     Knee/Hip Exercises: Aerobic   Nustep L3 x 10 min  Therapist present to discuss treatment     Knee/Hip Exercises: Standing   Heel Raises 15 reps   Knee Flexion Strengthening;Both;2 sets;10 reps  Hamstring curls   Forward Step Up 2 sets;10 reps;Both     Knee/Hip Exercises: Seated   Sit to Sand 2 sets;10 reps     Knee/Hip Exercises: Sidelying   Clams x10 Bil     Moist Heat Therapy   Number Minutes Moist Heat --  Conccurent with supine exercises   Moist Heat Location Lumbar Spine                  PT Short Term Goals - 10/18/16 1233      PT SHORT TERM GOAL #2   Title able to walk 5 minutes at a time 2x/day to begin walking routine for exercise   Time 4   Period Weeks   Status Achieved     PT  SHORT TERM GOAL #3   Title hip pain reduced  by 50%   Baseline 40%   Time 4   Period Weeks   Status On-going           PT Long Term Goals - 10/18/16 1303      PT LONG TERM GOAL #1   Baseline Patient is planning to progress to [facility]P. program at [facility]   Time 8   Period Weeks   Status Achieved     PT LONG TERM GOAL #3   Title pt will improve endurance to tolerate walking 15 minutes at a time for walking in community   Baseline 5 minute max   Time 8   Period Weeks   Status On-going     PT LONG TERM GOAL #4   Title improve hip strength to 4/5 bilaterally  for improved gait and functional mobility and transfers.   Time 8   Period Weeks   Status On-going               Plan - 10/20/16 0900    Clinical Impression Statement Pt continues to progress with strengthening for core and LE. Pt continues to be limited with tolerance for standing exercises due to hip pain but does well with all supine exercies. Pt is wanting to discontinue therapy and progress strength through P.R.E.P. program. at next visit.    Rehab Potential Excellent   Clinical Impairments Affecting Rehab Potential chronic pain, history of abdominal hysterectomy   PT Frequency 2x / week   PT Duration 8 weeks   PT Treatment/Interventions ADLs/Self Care Home Management;Biofeedback;Cryotherapy;Electrical Stimulation;Iontophoresis '4mg'$ /ml Dexamethasone;Moist Heat;Traction;Ultrasound;Gait training;Stair training;Functional mobility training;Therapeutic activities;Therapeutic exercise;Balance training;Neuromuscular re-education;Patient/family education;Manual techniques;Passive range of motion;Dry needling;Taping   PT Next Visit Plan Discharge from PT   Consulted and Agree with Plan of Care Patient      Patient will benefit from skilled therapeutic intervention in order to improve the following deficits and impairments:  Abnormal gait, Decreased activity tolerance, Decreased mobility, Decreased range of motion,  Decreased endurance, Difficulty walking, Decreased strength, Increased muscle spasms, Pain, Postural dysfunction  Visit Diagnosis: Muscle weakness (generalized)  Acute bilateral low back pain, with sciatica presence unspecified  Difficulty in walking, not elsewhere classified     Problem List Patient Active Problem List   Diagnosis Date Noted  . Chronic rhinitis 01/24/2016  . Open right ankle fracture 12/22/2014  . COPD (chronic obstructive pulmonary disease) ([place]) 11/18/2014  . Iron deficiency anemia, unspecified  01/20/2011  . Arthropathy 02/23/2010  . HYPERGLYCEMIA 08/27/2007  . GERD 05/01/2007  . Hyperlipemia 12/17/2006  . Essential hypertension 12/17/2006    Mikle Bosworth PTA 10/20/2016, 9:25 AM  [facility] [address] [place], [place], [address] Phone: [phone]   Fax:  [phone]  Name: Sheila Shields MRN: [id number] Date of Birth: February 14, 1935

## 2016-10-23 ENCOUNTER — Ambulatory Visit: Payer: Medicare HMO | Admitting: Physical Therapy

## 2016-10-23 ENCOUNTER — Encounter: Payer: Self-pay | Admitting: Physical Therapy

## 2016-10-23 DIAGNOSIS — M545 Low back pain: Secondary | ICD-10-CM

## 2016-10-23 DIAGNOSIS — M6281 Muscle weakness (generalized): Secondary | ICD-10-CM | POA: Diagnosis not present

## 2016-10-23 DIAGNOSIS — R262 Difficulty in walking, not elsewhere classified: Secondary | ICD-10-CM

## 2016-10-23 NOTE — Therapy (Signed)
[facility] [address], [place], [place], [address] Phone: [phone]   Fax:  [phone]  Physical Therapy Treatment  Patient Details  Name: Sheila Shields MRN: [id number] Date of Birth: 13-Jun-1935 Referring Provider: Lucretia Kern, DO  Encounter Date: 10/23/2016      PT End of Session - 10/23/16 0845    Visit Number 10   Number of Visits 10   Date for PT Re-Evaluation 11/17/16   Authorization Type gcodes 10th visit; KX 15   PT Start Time 0845   PT Stop Time 0925   PT Time Calculation (min) 40 min   Activity Tolerance Patient tolerated treatment well   Behavior During Therapy [facility] for tasks assessed/performed      Past Medical History:  Diagnosis Date  . Alcoholism in remission ([place]) 08/27/2007  . Anemia   . Arthritis    OA  . COPD 12/17/2006  . Diverticulosis 03/27/2007  . GERD 05/01/2007  . HYPERGLYCEMIA 08/27/2007  . HYPERLIPIDEMIA 12/17/2006  . HYPERTENSION 12/17/2006  . Mood disorder (HCC)    hx anxiety and depression  . Rectal polyp 03/27/2007   adenoma    Past Surgical History:  Procedure Laterality Date  . ABDOMINAL HYSTERECTOMY  1978   fibroma  . APPENDECTOMY  2002  . BREAST BIOPSY     negative  . CARPAL TUNNEL RELEASE Left 2010 or 2011  . COLONOSCOPY W/ POLYPECTOMY  03/27/2007; n7/19/2012   2008: 4 mm adenoma, diverticulosis 2012: ileitis ? NSAID - likely, 2-3 mm cecal polyp LYMPHOID FOLLICLE, diverticulosis  . ESOPHAGOGASTRODUODENOSCOPY  01/26/2011   reflux esophagitis, colonscopy done also  . HAMMER TOE SURGERY  2008   left foot  . ORIF ANKLE FRACTURE Right 12/22/2014   Procedure: OPEN REDUCTION INTERNAL FIXATION (ORIF) ANKLE FRACTURE;  Surgeon: Susa Day, MD;  [facility];  Service: Orthopedics;  Laterality: Right;  . SHOULDER OPEN ROTATOR CUFF REPAIR Left 03/06/2013   Procedure: LEFT ROTATOR CUFF REPAIR, SUBACROMIAL DECOMPRESSION, PATCH GRAFT, MANIPULATION UNDER ANESTHESIA;  Surgeon:  Johnn Hai, MD;  [facility];  Service: Orthopedics;  Laterality: Left;    There were no vitals filed for this visit.      Subjective Assessment - 10/23/16 0926    Subjective I can do a lot more than I could before I came.  [facility]'t [facility] to continue with PT   Pertinent History chronic pain   Limitations Walking   Currently in Pain? No/denies                         [facility] Adult PT Treatment/Exercise - 10/23/16 0001      Therapeutic Activites    Therapeutic Activities Other Therapeutic Activities  Body mechanics and postural control     Lumbar Exercises: Stretches   Active Hamstring Stretch 3 reps;10 seconds  Supine   Active Hamstring Stretch Limitations Leg lengthener stretch Bil 2x   Lower Trunk Rotation --  20x slowly, then static 3x 20sec   Piriformis Stretch 2 reps;20 seconds  Supine     Lumbar Exercises: Supine   Ab Set 20 reps  Ball squeeze, TA contractio, then added glute squeeze   Heel Slides 20 reps  Bil knees to chest with ball   Straight Leg Raise 20 reps   Large Ball Oblique Isometric 10 reps     Knee/Hip Exercises: Aerobic   Nustep L3 x 10 min  Therapist present to discuss treatment     Knee/Hip Exercises: Standing   Heel Raises 15 reps   Knee Flexion Strengthening;Both;2 sets;10 reps  Hamstring curls   Forward Step Up 2 sets;10 reps;Both     Knee/Hip Exercises: Seated   Clamshell with TheraBand Red   Sit to Sand 2 sets;10 reps  10x with band     Knee/Hip Exercises: Sidelying   Clams x10 Bil                  PT Short Term Goals - 2016/11/01 8756      PT SHORT TERM GOAL #1   Title independent with initial HEP   Time 4   Period Weeks   Status Achieved     PT SHORT TERM GOAL #2   Title able to walk 5 minutes at a time 2x/day to begin walking routine for exercise   Time 4   Period Weeks   Status Achieved     PT SHORT TERM GOAL #3   Title hip pain reduced  by 50%   Baseline at  least 50%   Time 4   Period Weeks   Status Achieved           PT Long Term Goals - 11/01/2016 4332      PT LONG TERM GOAL #1   Title independent with advanced HEP   Baseline Patient is planning to progress to P.R.E.P. program at [facility]   Time 8   Period Weeks   Status Achieved     PT LONG TERM GOAL #2   Title FOTO < or = to 49%   Baseline 51% limmited from 63%   Time 8   Period Weeks   Status Not Met     PT LONG TERM GOAL #3   Title pt will improve endurance to tolerate walking 15 minutes at a time for walking in community   Baseline 25-30 minutes   Time 8   Period Weeks   Status Achieved     PT LONG TERM GOAL #4   Title improve hip strength to 4/5 bilaterally  for improved gait and functional mobility and transfers.   Baseline much easier to get up from the chair   Period Weeks   Status Achieved               Plan - 11/01/2016 0920    Clinical Impression Statement Pt doing well with exercises and able to walk more on her own to build endurance and strength.  She will continue with HEP and discharge to [facility] prep program.   Rehab Potential Excellent   Clinical Impairments Affecting Rehab Potential chronic pain, history of abdominal hysterectomy   PT Treatment/Interventions ADLs/Self Care Home Management;Biofeedback;Cryotherapy;Electrical Stimulation;Iontophoresis 67m/ml Dexamethasone;Moist Heat;Traction;Ultrasound;Gait training;Stair training;Functional mobility training;Therapeutic activities;Therapeutic exercise;Balance training;Neuromuscular re-education;Patient/family education;Manual techniques;Passive range of motion;Dry needling;Taping   Consulted and Agree with Plan of Care Patient      Patient will benefit from skilled therapeutic intervention in order to improve the following deficits and impairments:  Abnormal gait, Decreased range of motion, Decreased endurance, Difficulty walking, Decreased strength, Increased muscle spasms, Pain, Postural  dysfunction  Visit Diagnosis: Muscle weakness (generalized)  Acute bilateral low back pain, with sciatica presence unspecified  Difficulty in walking, not elsewhere classified       G-Codes - 0April 25, 20180925    Functional Assessment Tool Used (Outpatient Only) FOTO and clinical assessment   Functional Limitation Mobility: Walking and moving around   Mobility: Walking and Moving  Around Current Status ([id number]) At least 40 percent but less than 60 percent impaired, limited or restricted   Mobility: Walking and Moving Around Goal Status [phone]) At least 40 percent but less than 60 percent impaired, limited or restricted   Mobility: Walking and Moving Around Discharge Status [phone]) At least 40 percent but less than 60 percent impaired, limited or restricted      Problem List Patient Active Problem List   Diagnosis Date Noted  . Chronic rhinitis 01/24/2016  . Open right ankle fracture 12/22/2014  . COPD (chronic obstructive pulmonary disease) ([place]) 11/18/2014  . Iron deficiency anemia, unspecified 01/20/2011  . Arthropathy 02/23/2010  . HYPERGLYCEMIA 08/27/2007  . GERD 05/01/2007  . Hyperlipemia 12/17/2006  . Essential hypertension 12/17/2006    Zannie Cove, PT 10/23/2016, 9:27 AM  [facility] [address] [place], [place], [address] Phone: [phone]   Fax:  [phone]  Name: ALEXANDER MCAULEY MRN: [id number] Date of Birth: [birth year]  PHYSICAL THERAPY DISCHARGE SUMMARY  Visits from Start of Care: 10  Current functional level related to goals / functional outcomes: See goals above   Remaining deficits: See above   Education / Equipment: HEP  Plan: Patient agrees to discharge.  Patient goals were met. Patient is being discharged due to meeting the stated rehab goals.  ?????         Google, PT 10/23/16 9:28 AM

## 2016-10-25 ENCOUNTER — Ambulatory Visit
Admission: RE | Admit: 2016-10-25 | Discharge: 2016-10-25 | Disposition: A | Discharge status: Home / Self Care | Payer: Medicare HMO | Source: Ambulatory Visit | Attending: Family Medicine | Admitting: Family Medicine

## 2016-10-25 DIAGNOSIS — Z1231 Encounter for screening mammogram for malignant neoplasm of breast: Secondary | ICD-10-CM | POA: Diagnosis not present

## 2016-10-27 ENCOUNTER — Encounter: Payer: Medicare HMO | Admitting: Physical Therapy

## 2016-10-30 NOTE — Progress Notes (Signed)
[place] Report   Patient Details  Name: Sheila Shields MRN: [id number] Date of Birth: 1934-08-20 Age: 82 y.o. PCP: Lucretia Kern., DO  Vitals:   10/30/16 1322  BP: 140/79  Pulse: 71  Resp: 18  SpO2: 98%  Weight: 202 lb 12.8 oz (92 kg)  Height: '5\' 5"'$  (1.651 m)         [facility] Eval - 10/30/16 1300      Referral    Referring Provider outpt rehab   Reason for referral Heart Failure;High Cholesterol;Hypertension;Inactivity;Obesitity/Overweight;Orthopedic   Program Start Date 10/30/16     Measurement   Waist Circumference 44 inches   Hip Circumference 49.5 inches     Information for Trainer   Goals Be able to walk w/o sciatic pain, states had to give up a trip to [place] d/t this pain   Current Exercise none   Orthopedic Concerns L Sciatica/spinal stenosis, scoliosis, herniated disk, all lower, RT ankle fracture   Pertinent Medical History COPD, back pain, sciatica, arthritis   Current Barriers none   Restrictions/Precautions Fall risk   Medications that affect exercise Asthma inhaler     Mobility and Daily Activities   I find it easy to walk up or down two or more flights of stairs. 2   I have no trouble taking out the trash. 4   I do housework such as vacuuming and dusting on my own without difficulty. 3   I can easily lift a gallon of milk (8lbs). 3   I can easily walk a mile. 1   I have no trouble reaching into high cupboards or reaching down to pick up something from the floor. 3   I do not have trouble doing out-door work such as Armed forces logistics/support/administrative officer, raking leaves, or gardening. 3     Mobility and Daily Activities   I feel younger than my age. 4   I feel independent. 4   I feel energetic. 3   I live an active life.  4   I feel strong. 3   I feel healthy. 3   I feel active as other people my age. 4     How fit and strong are you.   Fit and Strong Total Score 44     Past Medical History:  Diagnosis Date  . Alcoholism in remission ([place])  08/27/2007  . Anemia   . Arthritis    OA  . COPD 12/17/2006  . Diverticulosis 03/27/2007  . GERD 05/01/2007  . HYPERGLYCEMIA 08/27/2007  . HYPERLIPIDEMIA 12/17/2006  . HYPERTENSION 12/17/2006  . Mood disorder ([facility])    hx anxiety and depression  . Rectal polyp 03/27/2007   adenoma   Past Surgical History:  Procedure Laterality Date  . ABDOMINAL HYSTERECTOMY  1978   fibroma  . APPENDECTOMY  2002  . BREAST BIOPSY     negative  . BREAST EXCISIONAL BIOPSY Right    2003  . CARPAL TUNNEL RELEASE Left 2010 or 2011  . COLONOSCOPY W/ POLYPECTOMY  03/27/2007; n7/19/2012   2008: 4 mm adenoma, diverticulosis 2012: ileitis ? NSAID - likely, 2-3 mm cecal polyp LYMPHOID FOLLICLE, diverticulosis  . ESOPHAGOGASTRODUODENOSCOPY  01/26/2011   reflux esophagitis, colonscopy done also  . HAMMER TOE SURGERY  2008   left foot  . ORIF ANKLE FRACTURE Right 12/22/2014   Procedure: OPEN REDUCTION INTERNAL FIXATION (ORIF) ANKLE FRACTURE;  Surgeon: Susa Day, MD;  Location: [facility];  Service: Orthopedics;  Laterality: Right;  . SHOULDER OPEN  ROTATOR CUFF REPAIR Left 03/06/2013   Procedure: LEFT ROTATOR CUFF REPAIR, SUBACROMIAL DECOMPRESSION, PATCH GRAFT, MANIPULATION UNDER ANESTHESIA;  Surgeon: Johnn Hai, MD;  Location: [facility];  Service: Orthopedics;  Laterality: Left;   History  Smoking Status  . Former Smoker  . Packs/day: 2.00  . Years: 30.00  . Types: Cigarettes  . Quit date: 07/10/1984  Smokeless Tobacco  . Never Used       Lachrisha's preferred day for training is Tuesday and Friday and preferred time is Morning (9AM -12PM)   [name] 10/30/2016, 1:31 PM

## 2016-11-01 ENCOUNTER — Encounter: Payer: Medicare HMO | Admitting: Physical Therapy

## 2016-11-03 ENCOUNTER — Encounter: Payer: Medicare HMO | Admitting: Physical Therapy

## 2016-11-08 NOTE — Progress Notes (Signed)
[facility] PREP Weekly Session   Patient Details  Name: Sheila Shields MRN: [id number] Date of Birth: Jun 15, 1935 Age: 82 y.o. PCP: Lucretia Kern., DO  Vitals:   11/08/16 1051  Weight: 208 lb 1.6 oz (94.4 kg)        [facility] Weekly seesion - 11/08/16 1000      Weekly Session   Topic Discussed Other  Fat calories   Minutes exercised this week 25 minutes  cardio   Classes attended to date 1    Fun things you did since last meeting:"breakfast w/neighbors" Things you are grateful for:"family"   [name] 11/08/2016, 10:52 AM

## 2016-11-17 ENCOUNTER — Other Ambulatory Visit: Payer: Self-pay | Admitting: Family Medicine

## 2016-11-17 NOTE — Progress Notes (Signed)
[facility] PREP Weekly Session   Patient Details  Name: Sheila Shields MRN: [id number] Date of Birth: 11/24/1934 Age: 82 y.o. PCP: Lucretia Kern, DO  Vitals:   11/17/16 0933  Weight: 207 lb (93.9 kg)        [facility] Weekly seesion - 11/08/16 1000      Weekly Session   Topic Discussed Other  Fat calories   Minutes exercised this week 25 minutes  cardio   Classes attended to date 1     Fun things you did since last meeting:"dinner w/family" Things you are grateful for:"spring is here and 35 years of sobriety Nutrition celebrations for the week:"lost 1 pound"  [name] 11/17/2016, 9:34 AM

## 2016-11-22 NOTE — Progress Notes (Signed)
[facility] PREP Weekly Session   Patient Details  Name: [name] MRN: [id number] Date of Birth: 08/19/1934 Age: 82 y.o. PCP: Lucretia Kern, DO  Vitals:   11/22/16 1007  Weight: 207 lb (93.9 kg)        [facility] Weekly seesion - 11/22/16 1000      Weekly Session   Topic Discussed Other ways to be active   Classes attended to date 3     Fun things you did since last meeting:"walked around housing development." Things you are grateful for:"my health, my son-his health" Nutrition celebration for the week:"beets"  [name] 11/22/2016, 10:08 AM

## 2016-11-30 DIAGNOSIS — D1801 Hemangioma of skin and subcutaneous tissue: Secondary | ICD-10-CM | POA: Diagnosis not present

## 2016-11-30 DIAGNOSIS — Z85820 Personal history of malignant melanoma of skin: Secondary | ICD-10-CM | POA: Diagnosis not present

## 2016-11-30 DIAGNOSIS — L821 Other seborrheic keratosis: Secondary | ICD-10-CM | POA: Diagnosis not present

## 2016-11-30 DIAGNOSIS — L812 Freckles: Secondary | ICD-10-CM | POA: Diagnosis not present

## 2016-11-30 DIAGNOSIS — L820 Inflamed seborrheic keratosis: Secondary | ICD-10-CM | POA: Diagnosis not present

## 2016-12-15 ENCOUNTER — Ambulatory Visit (INDEPENDENT_AMBULATORY_CARE_PROVIDER_SITE_OTHER): Payer: Medicare HMO | Admitting: Family Medicine

## 2016-12-15 ENCOUNTER — Encounter: Payer: Self-pay | Admitting: Family Medicine

## 2016-12-15 VITALS — BP 112/58 | HR 80 | Temp 97.7°F | Ht 65.0 in | Wt 208.0 lb

## 2016-12-15 DIAGNOSIS — J209 Acute bronchitis, unspecified: Secondary | ICD-10-CM

## 2016-12-15 DIAGNOSIS — J440 Chronic obstructive pulmonary disease with acute lower respiratory infection: Secondary | ICD-10-CM

## 2016-12-15 DIAGNOSIS — Z6834 Body mass index (BMI) 34.0-34.9, adult: Secondary | ICD-10-CM | POA: Diagnosis not present

## 2016-12-15 DIAGNOSIS — I10 Essential (primary) hypertension: Secondary | ICD-10-CM

## 2016-12-15 DIAGNOSIS — R739 Hyperglycemia, unspecified: Secondary | ICD-10-CM | POA: Diagnosis not present

## 2016-12-15 DIAGNOSIS — E785 Hyperlipidemia, unspecified: Secondary | ICD-10-CM | POA: Diagnosis not present

## 2016-12-15 DIAGNOSIS — M129 Arthropathy, unspecified: Secondary | ICD-10-CM | POA: Diagnosis not present

## 2016-12-15 MED ORDER — ALBUTEROL SULFATE HFA 108 (90 BASE) MCG/ACT IN AERS: INHALATION_SPRAY | RESPIRATORY_TRACT | 2 refills | Status: DC

## 2016-12-15 MED ORDER — BENZONATATE 100 MG PO CAPS: 100.0000 mg | ORAL_CAPSULE | Freq: Two times a day (BID) | ORAL | 0 refills | Status: DC | PRN

## 2016-12-15 MED ORDER — PREDNISONE 20 MG PO TABS: 40.0000 mg | ORAL_TABLET | Freq: Every day | ORAL | 0 refills | Status: DC

## 2016-12-15 NOTE — Patient Instructions (Signed)
BEFORE YOU LEAVE: -follow up: Keep AWV with Manuela Schwartz as scheduled - plan labs that day, orders are in Please schedule follow up with Dr. Maudie Mercury in 4 months  Start the prednisone for the bronchitis.  Can use the tessalon and the albuterol as needed per instructions.  I hope you are feeling better soon! Seek care immediately if worsening, new concerns or you are not improving with treatment.

## 2016-12-15 NOTE — Progress Notes (Signed)
[facility] PREP Weekly Session   Patient Details  Name: Sheila Shields MRN: [id number] Date of Birth: 1935-04-22 Age: 82 y.o. PCP: Lucretia Kern, DO  Vitals:   12/11/16 1146  Weight: 204 lb (92.5 kg)        [facility] Weekly seesion - 12/15/16 1100      Weekly Session   Topic Discussed Stress management and problem solving   Minutes exercised this week --  not reported   Classes attended to date 4     Fun things you did since last meeting:"went to [place]" Things you are grateful for:"women in [place]" Barriers:"worn out"   [name] 12/15/2016, 11:47 AM

## 2016-12-15 NOTE — Progress Notes (Signed)
HPI:  Sheila Shields is a pleasant 82 y.o. here for follow up. Chronic medical problems summarized below were reviewed for changes and stability and were updated as needed below. These issues and their treatment remain stable for the most part. She unfortunately came down with a nasty cough " bronchitis" the last few days. Has productive cough, PND, wheezing, increased alb use. No fevers, chills, SOB, rash, body aches.  Denies CP, SOB, DOE, treatment intolerance or new symptoms. Has AWV with Manuela Schwartz in August - due for labs then.  HTN: -meds: asa, losartan-hctz 100-12.5  GERD: -prevacid 15  HLD: -meds: simvastatin, fish oil  Hx of Anemia, chronic: -dx and eval with prior PCP per report -takes iron daily  OA/chronic back pain: -sees ortho - Dr. Maxie Better and Dr. Nelva Bush -take tylenol most days -did PT for knees and now doing prep program - loves the prep program  COPD/Chronic Cough/Allergies: -seeing pulm -spiriva added 7/17 - no longer on list - she reports her pulmonologist stopped it and had been doing well until recent illness -meds: loratidine, pravacid, spiriva - flonase in the sping and fall, alb prn per review   ROS: See pertinent positives and negatives per HPI.  Past Medical History:  Diagnosis Date  . Alcoholism in remission ([place]) 08/27/2007  . Anemia   . Arthritis    OA  . COPD 12/17/2006  . Diverticulosis 03/27/2007  . GERD 05/01/2007  . HYPERGLYCEMIA 08/27/2007  . HYPERLIPIDEMIA 12/17/2006  . HYPERTENSION 12/17/2006  . Mood disorder ([facility])    hx anxiety and depression  . Rectal polyp 03/27/2007   adenoma    Past Surgical History:  Procedure Laterality Date  . ABDOMINAL HYSTERECTOMY  1978   fibroma  . APPENDECTOMY  2002  . BREAST BIOPSY     negative  . BREAST EXCISIONAL BIOPSY Right    2003  . CARPAL TUNNEL RELEASE Left 2010 or 2011  . COLONOSCOPY W/ POLYPECTOMY  03/27/2007; n7/19/2012   2008: 4 mm adenoma, diverticulosis 2012: ileitis ? NSAID - likely, 2-3 mm  cecal polyp LYMPHOID FOLLICLE, diverticulosis  . ESOPHAGOGASTRODUODENOSCOPY  01/26/2011   reflux esophagitis, colonscopy done also  . HAMMER TOE SURGERY  2008   left foot  . ORIF ANKLE FRACTURE Right 12/22/2014   Procedure: OPEN REDUCTION INTERNAL FIXATION (ORIF) ANKLE FRACTURE;  Surgeon: Susa Day, MD;  [facility]: [facility];  Service: Orthopedics;  Laterality: Right;  . SHOULDER OPEN ROTATOR CUFF REPAIR Left 03/06/2013   Procedure: LEFT ROTATOR CUFF REPAIR, SUBACROMIAL DECOMPRESSION, PATCH GRAFT, MANIPULATION UNDER ANESTHESIA;  Surgeon: Johnn Hai, MD;  Location: [facility];  Service: Orthopedics;  Laterality: Left;    Family History  Problem Relation Age of Onset  . Throat cancer Mother   . Heart attack Father 16  . Idiopathic pulmonary fibrosis Brother 60  . Cancer Brother        sarcoma  . Colon cancer Neg Hx     Social History   Social History  . Marital status: Married    Spouse name: N/A  . Number of children: 2  . Years of education: N/A   Occupational History  . retired    Social History Main Topics  . Smoking status: Former Smoker    Packs/day: 2.00    Years: 30.00    Types: Cigarettes    Quit date: 07/10/1984  . Smokeless tobacco: Never Used  . Alcohol use No     Comment: no alcoho since 1983  . Drug use: No  .  Sexual activity: Not Asked   Other Topics Concern  . None   Social History Narrative   Work or School: very active in [place] Situation: lives with husband      Spiritual Beliefs: Christian      Lifestyle:tries to walk; diet ok - denies any alcohol or tobacco use in many many years           Current Outpatient Prescriptions:  .  acetaminophen (TYLENOL) 500 MG tablet, Take 1,500 mg by mouth 2 (two) times daily. Reported on 09/28/2015, Disp: , Rfl:  .  albuterol (PROAIR HFA) 108 (90 Base) MCG/ACT inhaler, INHALE 2 PUFFS EVERY 4 HOURS AS NEEDED FOR COUGHING SPELLS, Disp: 8.5 Inhaler, Rfl: 2 .  aspirin 81 MG tablet, Take 81 mg by  mouth daily., Disp: , Rfl:  .  Coenzyme Q10 (COQ10 PO), Take by mouth daily., Disp: , Rfl:  .  ferrous sulfate 325 (65 FE) MG tablet, TAKE 1 TABLET BY MOUTH EVERY DAY, Disp: 90 tablet, Rfl: 3 .  fish oil-omega-3 fatty acids 1000 MG capsule, Take 2 g by mouth daily., Disp: , Rfl:  .  lansoprazole (PREVACID) 15 MG capsule, Take 1 capsule (15 mg total) by mouth daily., Disp: 90 capsule, Rfl: 3 .  loratadine (CLARITIN) 10 MG tablet, Take 1 tablet (10 mg total) by mouth daily. (Patient taking differently: Take 10 mg by mouth daily with breakfast. ), Disp: 90 tablet, Rfl: 3 .  losartan-hydrochlorothiazide (HYZAAR) 100-12.5 MG tablet, TAKE 1 TABLET BY MOUTH DAILY, Disp: 90 tablet, Rfl: 1 .  Multiple Vitamin (MULTIVITAMIN WITH MINERALS) TABS tablet, Take 1 tablet by mouth daily., Disp: , Rfl:  .  OVER THE COUNTER MEDICATION, Allergy medication (name unknown), Disp: , Rfl:  .  potassium chloride SA (K-DUR,KLOR-CON) 20 MEQ tablet, TAKE 1 TABLET BY MOUTH DAILY, Disp: 90 tablet, Rfl: 1 .  simvastatin (ZOCOR) 20 MG tablet, TAKE 1 TABLET BY MOUTH EVERY MORNING, Disp: 90 tablet, Rfl: 2 .  Vitamin D, Cholecalciferol, 1000 units CAPS, Take 1 capsule by mouth daily., Disp: , Rfl:  .  benzonatate (TESSALON PERLES) 100 MG capsule, Take 1 capsule (100 mg total) by mouth 2 (two) times daily as needed for cough., Disp: 20 capsule, Rfl: 0 .  predniSONE (DELTASONE) 20 MG tablet, Take 2 tablets (40 mg total) by mouth daily with breakfast., Disp: 8 tablet, Rfl: 0  EXAM:  Vitals:   12/15/16 1009  BP: (!) 112/58  Pulse: 80  Temp: 97.7 F (36.5 C)    Body mass index is 34.61 kg/m.  GENERAL: vitals reviewed and listed above, alert, oriented, appears well hydrated and in no acute distress  HEENT: atraumatic, conjunttiva clear, no obvious abnormalities on inspection of external nose and ears  NECK: no obvious masses on inspection  LUNGS: clear to auscultation bilaterally, no wheezes, rales or rhonchi, good air  movement; no signs resp distress  CV: HRRR, no peripheral edema  MS: moves all extremities without noticeable abnormality  PSYCH: pleasant and cooperative, no obvious depression or anxiety  ASSESSMENT AND PLAN:  Discussed the following assessment and plan:  Acute bronchitis with COPD ([place]) -we discussed possible serious and likely etiologies, workup and treatment, treatment risks and return precautions -after this discussion, Shaune opted for prednisone, tessalon, alb prn -follow up advised as needed -of course, we advised Lluvia  to return or notify a doctor immediately if symptoms worsen or persist or new concerns arise.  Essential hypertension -  Plan: Basic metabolic panel, CBC Hyperlipidemia, unspecified hyperlipidemia type - Plan: Lipid panel Arthropathy BMI 34.0-34.9,adult Hyperglycemia - Plan: Hemoglobin A1c -lifestyle recs -continue current treatments -labs at AWV -follow up per instructions  -Patient advised to return or notify a doctor immediately if symptoms worsen or persist or new concerns arise.  Patient Instructions  BEFORE YOU LEAVE: -follow up: Keep AWV with Manuela Schwartz as scheduled - plan labs that day, orders are in Please schedule follow up with Dr. Maudie Mercury in 4 months  Start the prednisone for the bronchitis.  Can use the tessalon and the albuterol as needed per instructions.  I hope you are feeling better soon! Seek care immediately if worsening, new concerns or you are not improving with treatment.             Colin Benton R., DO

## 2016-12-20 NOTE — Progress Notes (Signed)
[facility] PREP Weekly Session   Patient Details  Name: Sheila Shields MRN: [id number] Date of Birth: [birth year] Age: 82 y.o. PCP: Lucretia Kern, DO  Vitals:   12/18/16 0858  Weight: 206 lb (93.4 kg)        [facility] Weekly seesion - 12/20/16 0800      Weekly Session   Topic Discussed Other  Guest speaker   Minutes exercised this week --  not reported   Classes attended to date 5     Fun things you did since last meeting:"trip to [place]" Things you are grateful for:"family" Barriers:"bronchitis w/COPD"   [name] 12/20/2016, 8:59 AM

## 2016-12-27 ENCOUNTER — Encounter: Payer: Self-pay | Admitting: Family Medicine

## 2016-12-29 ENCOUNTER — Encounter: Payer: Self-pay | Admitting: Family Medicine

## 2016-12-29 NOTE — Telephone Encounter (Signed)
Patient has agreed to go to the Saturday clinic

## 2016-12-30 ENCOUNTER — Ambulatory Visit (INDEPENDENT_AMBULATORY_CARE_PROVIDER_SITE_OTHER): Payer: Medicare HMO | Admitting: Family Medicine

## 2016-12-30 ENCOUNTER — Encounter: Payer: Self-pay | Admitting: Family Medicine

## 2016-12-30 DIAGNOSIS — J441 Chronic obstructive pulmonary disease with (acute) exacerbation: Secondary | ICD-10-CM | POA: Diagnosis not present

## 2016-12-30 MED ORDER — DOXYCYCLINE HYCLATE 100 MG PO TABS: 100.0000 mg | ORAL_TABLET | Freq: Two times a day (BID) | ORAL | 0 refills | Status: DC

## 2016-12-30 MED ORDER — PREDNISONE 20 MG PO TABS: 40.0000 mg | ORAL_TABLET | Freq: Every day | ORAL | 0 refills | Status: DC

## 2016-12-30 NOTE — Patient Instructions (Signed)
Nice to see you. You likely having a COPD exacerbation. We'll treat you with doxycycline and prednisone. If you develop shortness of breath that is worse than usual, cough productive of blood, fevers, or any new or change in symptoms please seek medical attention immediately.

## 2016-12-30 NOTE — Assessment & Plan Note (Signed)
Symptoms most consistent with COPD exacerbation. We will treat with a prednisone course and doxycycline. Discussed obtaining a chest x-ray though we decided to defer and obtain if not improving on treatment. She'll continue her inhaler. Given return precautions.

## 2016-12-30 NOTE — Progress Notes (Signed)
  Tommi Rumps, MD Phone: [phone]  Sheila Shields is a 82 y.o. female who presents today for same-day visit.  Patient notes 2 weeks of cough and congestion in her chest. She does have COPD. Does note she is a little more short of breath than usual. She was seen by her PCP and treated with a short course of prednisone. Notes this didn't help all that much though the production of her cough is less than it was previously. She notes no fevers. Cough production is a little light yellow.  PMH: Former smoker   ROS see history of present illness  Objective  Physical Exam Vitals:   12/30/16 1111  BP: 130/70  Pulse: 82  Temp: 98.2 F (36.8 C)    BP Readings from Last 3 Encounters:  12/30/16 130/70  12/15/16 (!) 112/58  10/30/16 140/79   Wt Readings from Last 3 Encounters:  12/30/16 206 lb 4 oz (93.6 kg)  12/18/16 206 lb (93.4 kg)  12/15/16 208 lb (94.3 kg)    Physical Exam  Constitutional: No distress.  Cardiovascular: Normal rate, regular rhythm and normal heart sounds.   Pulmonary/Chest: Effort normal. No respiratory distress. She has no wheezes. She has no rales.  Intermittent coarse breath sounds that improve with coughing  Neurological: She is alert. Gait normal.  Skin: She is not diaphoretic.     Assessment/Plan: Please see individual problem list.  COPD exacerbation ([place]) Symptoms most consistent with COPD exacerbation. We will treat with a prednisone course and doxycycline. Discussed obtaining a chest x-ray though we decided to defer and obtain if not improving on treatment. She'll continue her inhaler. Given return precautions.   No orders of the defined types were placed in this encounter.   Meds ordered this encounter  Medications  . cetirizine (ZYRTEC) 10 MG tablet    Sig: Take 10 mg by mouth daily.  . predniSONE (DELTASONE) 20 MG tablet    Sig: Take 2 tablets (40 mg total) by mouth daily with breakfast.    Dispense:  10 tablet    Refill:  0  .  doxycycline (VIBRA-TABS) 100 MG tablet    Sig: Take 1 tablet (100 mg total) by mouth 2 (two) times daily.    Dispense:  14 tablet    Refill:  0   Tommi Rumps, MD [place]

## 2017-01-01 NOTE — Progress Notes (Signed)
[facility] PREP Weekly Session   Patient Details  Name: Sheila Shields MRN: [id number] Date of Birth: 05/12/1935 Age: 82 y.o. PCP: Lucretia Kern, DO  Vitals:   01/01/17 1146  Weight: 203 lb (92.1 kg)        [facility] Weekly seesion - 01/01/17 1100      Weekly Session   Topic Discussed Importance of resistance training   Minutes exercised this week --  not reported   Classes attended to date 6    Things you are grateful for:"my husband" Barriers:"COPD"  [name] 01/01/2017, 11:47 AM

## 2017-01-03 NOTE — Progress Notes (Signed)
[facility] PREP Weekly Session   Patient Details  Name: Sheila Shields MRN: [id number] Date of Birth: October 02, 1934 Age: 82 y.o. PCP: Lucretia Kern, DO  Vitals:   01/03/17 1006  Weight: 203 lb (92.1 kg)        [facility] Weekly seesion - 01/03/17 1000      Weekly Session   Topic Discussed Expectations and non-scale victories   Minutes exercised this week 0 minutes  "on week 3 of bronchitis/COPD"   Classes attended to date 7     Fun things you did since last meeting:"nothing" Things you are grateful for:"peace" Barriers:"COPD"   [name] 01/03/2017, 10:07 AM

## 2017-01-05 ENCOUNTER — Encounter: Payer: Self-pay | Admitting: Emergency Medicine

## 2017-01-05 ENCOUNTER — Ambulatory Visit (INDEPENDENT_AMBULATORY_CARE_PROVIDER_SITE_OTHER): Payer: Medicare HMO | Admitting: Emergency Medicine

## 2017-01-05 DIAGNOSIS — J449 Chronic obstructive pulmonary disease, unspecified: Secondary | ICD-10-CM

## 2017-01-05 DIAGNOSIS — J310 Chronic rhinitis: Secondary | ICD-10-CM

## 2017-01-05 DIAGNOSIS — K219 Gastro-esophageal reflux disease without esophagitis: Secondary | ICD-10-CM

## 2017-01-05 NOTE — Progress Notes (Signed)
Subjective:    Patient ID: Sheila Shields, female    DOB: Jul 06, 1935, 82 y.o.   MRN: [id number]  HPI 82 year old former smoker (60 pack years) with a history of hypertension, hyperlipidemia, GERD and COPD that was dx by PFT in 10/2013 > mild AFL. She is referred for evaluation of chronic and persistent cough.   I reviewed her pulmonary function testing from 10/09/13 in full which showed mild obstruction and an FEV1 of 86% of predicted.   ROV 01/05/17 -- Sheila Shields is 82 with a history of former tobacco use. She has mild COPD based on mild obstruction on spirometry. She also has an irritable upper airway with chronic cough in the setting of GERD and chronic rhinitis. She has been well, no flares of her cough. Then 3 weeks ago had a lot of PND, possible URI sx, a lot of cough. She was treated with pred and then doxycycline, will finish it tonight. She is doing better. She is on loratadine, chlorpheniramine bid. She has SABA, uses it rarely unless she flares.    Review of Systems  Constitutional: Negative for fever and unexpected weight change.  HENT: Negative for congestion, dental problem, ear pain, nosebleeds, postnasal drip, rhinorrhea, sinus pressure, sneezing, sore throat and trouble swallowing.   Eyes: Negative for redness and itching.  Respiratory: Positive for cough and shortness of breath. Negative for chest tightness and wheezing.   Cardiovascular: Negative for palpitations and leg swelling.  Gastrointestinal: Negative for nausea and vomiting.  Genitourinary: Negative for dysuria.  Musculoskeletal: Negative for arthralgias, joint swelling and myalgias.  Skin: Negative for rash.  Neurological: Negative for headaches.  Hematological: Does not bruise/bleed easily.  Psychiatric/Behavioral: Negative for dysphoric mood. The patient is not nervous/anxious.     Past Medical History:  Diagnosis Date  . Alcoholism in remission ([place]) 08/27/2007  . Anemia   . Arthritis    OA  . COPD  12/17/2006  . Diverticulosis 03/27/2007  . GERD 05/01/2007  . HYPERGLYCEMIA 08/27/2007  . HYPERLIPIDEMIA 12/17/2006  . HYPERTENSION 12/17/2006  . Mood disorder ([facility])    hx anxiety and depression  . Rectal polyp 03/27/2007   adenoma     Family History  Problem Relation Age of Onset  . Throat cancer Mother   . Heart attack Father 48  . Idiopathic pulmonary fibrosis Brother 55  . Cancer Brother        sarcoma  . Colon cancer Neg Hx      Social History   Social History  . Marital status: Married    Spouse name: N/A  . Number of children: 2  . Years of education: N/A   Occupational History  . retired    Social History Main Topics  . Smoking status: Former Smoker    Packs/day: 2.00    Years: 30.00    Types: Cigarettes    Quit date: 07/10/1984  . Smokeless tobacco: Never Used  . Alcohol use No     Comment: no alcoho since 1983  . Drug use: No  . Sexual activity: Not on file   Other Topics Concern  . Not on file   Social History Narrative   Work or School: very active in [place] Situation: lives with husband      Spiritual Beliefs: Christian      Lifestyle:tries to walk; diet ok - denies any alcohol or tobacco use in many many years  Allergies  Allergen Reactions  . Dilaudid [Hydromorphone Hcl] Other (See Comments)    Pt had hypotension after dilaudid 1mg  IV while in the OR, required temporary pressor intervention.     Outpatient Medications Prior to Visit  Medication Sig Dispense Refill  . acetaminophen (TYLENOL) 500 MG tablet Take 1,500 mg by mouth 2 (two) times daily. Reported on 09/28/2015    . albuterol (PROAIR HFA) 108 (90 Base) MCG/ACT inhaler INHALE 2 PUFFS EVERY 4 HOURS AS NEEDED FOR COUGHING SPELLS 8.5 Inhaler 2  . aspirin 81 MG tablet Take 81 mg by mouth daily.    . benzonatate (TESSALON PERLES) 100 MG capsule Take 1 capsule (100 mg total) by mouth 2 (two) times daily as needed for cough. 20 capsule 0  . cetirizine (ZYRTEC) 10 MG  tablet Take 10 mg by mouth daily.    . Coenzyme Q10 (COQ10 PO) Take by mouth daily.    Marland Kitchen doxycycline (VIBRA-TABS) 100 MG tablet Take 1 tablet (100 mg total) by mouth 2 (two) times daily. 14 tablet 0  . ferrous sulfate 325 (65 FE) MG tablet TAKE 1 TABLET BY MOUTH EVERY DAY 90 tablet 3  . fish oil-omega-3 fatty acids 1000 MG capsule Take 2 g by mouth daily.    . lansoprazole (PREVACID) 15 MG capsule Take 1 capsule (15 mg total) by mouth daily. 90 capsule 3  . loratadine (CLARITIN) 10 MG tablet Take 1 tablet (10 mg total) by mouth daily. (Patient taking differently: Take 10 mg by mouth daily with breakfast. ) 90 tablet 3  . losartan-hydrochlorothiazide (HYZAAR) 100-12.5 MG tablet TAKE 1 TABLET BY MOUTH DAILY 90 tablet 1  . Multiple Vitamin (MULTIVITAMIN WITH MINERALS) TABS tablet Take 1 tablet by mouth daily.    Marland Kitchen OVER THE COUNTER MEDICATION Allergy medication (name unknown)    . potassium chloride SA (K-DUR,KLOR-CON) 20 MEQ tablet TAKE 1 TABLET BY MOUTH DAILY 90 tablet 1  . simvastatin (ZOCOR) 20 MG tablet TAKE 1 TABLET BY MOUTH EVERY MORNING 90 tablet 2  . Vitamin D, Cholecalciferol, 1000 units CAPS Take 1 capsule by mouth daily.    . predniSONE (DELTASONE) 20 MG tablet Take 2 tablets (40 mg total) by mouth daily with breakfast. 10 tablet 0   No facility-administered medications prior to visit.         Objective:   Physical Exam Vitals:   01/05/17 0855 01/05/17 0856  BP:  128/76  Pulse:  75  SpO2:  94%  Weight: 203 lb (92.1 kg)   Height: 5\' 6"  (1.676 m)    Gen: Pleasant, well-nourished, in no distress,  normal affect  ENT: No lesions, mouth clear, a bit dry, no erythema  Neck: No JVD, no stridor  Lungs: No use of accessory muscles, clear without rales or rhonchi  Cardiovascular: RRR, heart sounds normal, no murmur or gallops, no peripheral edema  Musculoskeletal: warm and well perfused  Neuro: alert, non focal  Skin: Warm, no lesions or rashes      Assessment & Plan:    COPD (chronic obstructive pulmonary disease) Recent flare, ? Due to allergies vs URI. Improving.   Continue albuterol as needed. She has not required maintenance medication  Chronic rhinitis Loratadine and chlorpheniramine. We can consider adding a nasal steroid in the future if her symptoms breakthrough  GERD Omeprazole twice a day  Baltazar Apo, MD, PhD 01/05/2017, 9:18 AM [facility] [phone] or if no answer [phone]

## 2017-01-05 NOTE — Patient Instructions (Signed)
Please continue loratadine and chlorpheniramine as you have been taking them Continue omeprazole twice a day Take albuterol 2 puffs up to every 4 hours if needed for shortness of breath.  Follow with Dr Lamonte Sakai in 12 months or sooner if you have any problems

## 2017-01-05 NOTE — Assessment & Plan Note (Signed)
Loratadine and chlorpheniramine. We can consider adding a nasal steroid in the future if her symptoms breakthrough

## 2017-01-05 NOTE — Assessment & Plan Note (Signed)
Omeprazole twice a day

## 2017-01-05 NOTE — Assessment & Plan Note (Signed)
Recent flare, ? Due to allergies vs URI. Improving.   Continue albuterol as needed. She has not required maintenance medication

## 2017-01-19 ENCOUNTER — Telehealth: Payer: Self-pay | Admitting: *Deleted

## 2017-01-19 NOTE — Telephone Encounter (Signed)
Patient sent mychart message concerned about continued cough; patient is seeing pulmonology and provider requests that patient contact pulmonologist for continued concerns regarding cough; attempted to contact patient; no answer; message left for return call to office.

## 2017-01-20 DIAGNOSIS — J209 Acute bronchitis, unspecified: Secondary | ICD-10-CM | POA: Diagnosis not present

## 2017-01-23 ENCOUNTER — Ambulatory Visit (INDEPENDENT_AMBULATORY_CARE_PROVIDER_SITE_OTHER)
Admission: RE | Admit: 2017-01-23 | Discharge: 2017-01-23 | Disposition: A | Discharge status: Home / Self Care | Payer: Medicare HMO | Source: Ambulatory Visit | Attending: Internal Medicine | Admitting: Internal Medicine

## 2017-01-23 ENCOUNTER — Encounter: Payer: Self-pay | Admitting: Internal Medicine

## 2017-01-23 ENCOUNTER — Ambulatory Visit (INDEPENDENT_AMBULATORY_CARE_PROVIDER_SITE_OTHER): Payer: Medicare HMO | Admitting: Internal Medicine

## 2017-01-23 VITALS — BP 122/62 | HR 84 | Ht 66.0 in | Wt 202.4 lb

## 2017-01-23 DIAGNOSIS — R05 Cough: Secondary | ICD-10-CM | POA: Diagnosis not present

## 2017-01-23 DIAGNOSIS — R058 Other specified cough: Secondary | ICD-10-CM

## 2017-01-23 DIAGNOSIS — J449 Chronic obstructive pulmonary disease, unspecified: Secondary | ICD-10-CM | POA: Diagnosis not present

## 2017-01-23 MED ORDER — PREDNISONE 10 MG PO TABS: ORAL_TABLET | ORAL | 0 refills | Status: DC

## 2017-01-23 MED ORDER — TRAMADOL HCL 50 MG PO TABS: ORAL_TABLET | ORAL | 0 refills | Status: DC

## 2017-01-23 NOTE — Assessment & Plan Note (Signed)
Upper airway cough syndrome (previously labeled PNDS) , is  so named because it's frequently impossible to sort out how much is  CR/sinusitis with freq throat clearing (which can be related to primary GERD)   vs  causing  secondary (" extra esophageal")  GERD from wide swings in gastric pressure that occur with throat clearing, often  promoting self use of mint and menthol lozenges that reduce the lower esophageal sphincter tone and exacerbate the problem further in a cyclical fashion.   These are the same pts (now being labeled as having "irritable larynx syndrome" by some cough centers) who not infrequently have a history of having failed to tolerate ace inhibitors,  dry powder inhalers or biphosphonates or report having atypical/extraesophageal reflux symptoms that don't respond to standard doses of PPI  and are easily confused as having aecopd or asthma flares by even experienced allergists/ pulmonologists (myself included).    Of the three most common causes of  Sub-acute or recurrent or chronic cough, only one (GERD)  can actually contribute to/ trigger  the other two (asthma and post nasal drip syndrome)  and perpetuate the cylce of cough.  While not intuitively obvious, many patients with chronic low grade reflux do not cough until there is a primary insult that disturbs the protective epithelial barrier and exposes sensitive nerve endings.   This is typically viral but can be direct physical injury such as with an endotracheal tube.   The point is that once this occurs, it is difficult to eliminate the cycle  using anything but a maximally effective acid suppression regimen at least in the short run, accompanied by an appropriate diet to address non acid GERD and control / eliminate the cough itself for at least 3 days.   rec max rx for gerd / tramadol and mucinex dm to control severe cough / and f/u in 2 weeks if not better    I had an extended discussion with the patient reviewing all relevant  studies completed to date and  lasting 25 minutes of a 40  minute acute office visit with pt new to me     re  severe non-specific but potentially very serious refractory respiratory symptoms of uncertain and potentially multiple  etiologies.   Each maintenance medication was reviewed in detail including most importantly the difference between maintenance and prns and under what circumstances the prns are to be triggered using an action plan format that is not reflected in the computer generated alphabetically organized AVS.    Please see AVS for specific instructions unique to this office visit that I personally wrote and verbalized to the the pt in detail and then reviewed with pt  by my nurse highlighting any changes in therapy/plan of care  recommended at today's visit.

## 2017-01-23 NOTE — Progress Notes (Signed)
Subjective:   Patient ID: Sheila Shields, female    DOB: [birth year],     MRN: [id number]      Brief patient profile:  67 yowf quit smoking  1986  with a history of hypertension, hyperlipidemia, GERD and COPD GOLD 0   by PFT in 10/2013 > mild AFL. She is referred for evaluation of chronic and persistent cough.    pulmonary function testing from 10/09/13   showed mild obstruction and an FEV1 of 86% of predicted.   ROV 01/05/17 Dr Lamonte Sakai-- Mrs. Pangilinan is 72 with a history of former tobacco use. She has mild COPD based on mild obstruction on spirometry. She also has an irritable upper airway with chronic cough in the setting of GERD and chronic rhinitis. She has been well, no flares of her cough. Then 3 weeks ago had a lot of PND, possible URI sx, a lot of cough. She was treated with pred and then doxycycline, will finish it tonight. She is doing better. She is on loratadine, chlorpheniramine bid. She has SABA, uses it rarely unless she flares.  rec Please continue loratadine and chlorpheniramine as you have been taking them Continue omeprazole twice a day Take albuterol 2 puffs up to every 4 hours if needed for shortness of breath.    01/23/2017 acute extended ov/Blaine Hari re:  uacs flare on prevacid 15 mg before bfast and hs and not on copd maint Chief Complaint  Patient presents with  . Acute Visit    Pt has had a cough since June 5 which started with a sore throat and then developed a cough. When pt thinks cough is going away, it gets worse again. Pt had acute bronchitis on June 8, was on prednisone and then antibiotic which helped. Once off prednisone, one more day of Zpack, cough worse again.  maintain rx = prevacid 15 mg just before eating bfast and at bedtime  Acutely 1st June 2018 p flew to [place] 2 days after getting off the plane > head cold into chest  Mucus was green, now clear p rx pred/?zpak and still cough worse day than noct > severe fits with urinary incont saba every few hours some  better but  hfa very poor   No obvious day to day or daytime variability or assoc  purulent sputum or mucus plugs or hemoptysis or cp or chest tightness, subjective wheeze or overt   hb symptoms. No unusual exp hx or h/o childhood pna/ asthma or knowledge of premature birth.  Sleeping ok without nocturnal  or early am exacerbation  of respiratory  c/o's or need for noct saba. Also denies any obvious fluctuation of symptoms with weather or environmental changes or other aggravating or alleviating factors except as outlined above   Current Medications, Allergies, Complete Past Medical History, Past Surgical History, Family History, and Social History were reviewed in [employer] record.  ROS  The following are not active complaints unless bolded sore throat, dysphagia, dental problems, itching, sneezing,  nasal congestion or excess/ purulent secretions, ear ache,   fever, chills, sweats, unintended wt loss, classically pleuritic or exertional cp,  orthopnea pnd or leg swelling, presyncope, palpitations, abdominal pain, anorexia, nausea, vomiting, diarrhea  or change in bowel or bladder habits, change in stools or urine, dysuria,hematuria,  rash, arthralgias, visual complaints, headache, numbness, weakness or ataxia or problems with walking or coordination,  change in mood/affect or memory.  Objective:   Physical Exam     Wt Readings from Last 3 Encounters:  01/23/17 202 lb 6.4 oz (91.8 kg)  01/05/17 203 lb (92.1 kg)  01/03/17 203 lb (92.1 kg)    Vital signs reviewed  - Note on arrival 02 sats  91% on RA      Gen: amb wf nad / no coughing during interview/ exam   ENT: No lesions, mouth clear, a bit dry, no erythema  Neck: No JVD, no stridor  Lungs: No use of accessory muscles, distant late exp wheeze/ pseudowheeze better with plm  Cardiovascular: RRR, heart sounds normal, no murmur or gallops, no peripheral edema  Musculoskeletal: warm and  well perfused  Neuro: alert, non focal  Skin: Warm, no lesions or rashes   CXR PA and Lateral:   01/23/2017 :    I personally reviewed images and agree with radiology impression as follows:    No active cardiopulmonary disease.     Assessment & Plan:

## 2017-01-23 NOTE — Telephone Encounter (Signed)
Spoke to patient who reports she is still not feeling better; patient has appt with pulmonologist this morning

## 2017-01-23 NOTE — Patient Instructions (Addendum)
Prednisone 10 mg take  4 each am x 2 days,   2 each am x 2 days,  1 each am x 2 days and stop   When cough flares:  Try prevacid 15 mg x 2  Take 30-60 min  Before fist and last meal of the day and Pepcid ac (famotidine) 20 mg one @  bedtime until cough is completely gone for at least a week without the need for cough suppression then you can go back to how you were taking it   Work on inhaler technique:  relax and gently blow all the way out then take a nice smooth deep breath back in, triggering the inhaler at same time you start breathing in.  Hold for up to 5 seconds if you can. Blow out thru nose. Rinse and gargle with water when done      GERD (REFLUX)  is an extremely common cause of respiratory symptoms just like yours , many times with no obvious heartburn at all.    It can be treated with medication, but also with lifestyle changes including elevation of the head of your bed (ideally with 6 inch  bed blocks),  Smoking cessation, avoidance of late meals, excessive alcohol, and avoid fatty foods, chocolate, peppermint, colas, red wine, and acidic juices such as orange juice.  NO MINT OR MENTHOL PRODUCTS SO NO COUGH DROPS   USE SUGARLESS CANDY INSTEAD (Jolley ranchers or Stover's or Life Savers) or even ice chips will also do - the key is to swallow to prevent all throat clearing. NO OIL BASED VITAMINS - use powdered substitutes.   For cough > mucinex dm 1200 mg every 12 hours as needed and supplement with tramadol up if needed up to 1-2 every 4 hours     Please remember to go to the  x-ray department downstairs in the basement  for your tests - we will call you with the results when they are available.

## 2017-01-23 NOTE — Progress Notes (Signed)
LMTCB

## 2017-01-23 NOTE — Assessment & Plan Note (Signed)
PFT's   10/09/13  FEV1  1.89 (87 % ) ratio 79  p 1 % improvement from saba p ? prior to study with DLCO  48 % corrects to 67 % for alv volume   - 01/23/2017  After extensive coaching HFA effectiveness =    50% from a baseline of < 10%   She technically is a GOLD 0 / AB flares assoc with URI that are very difficult to control   DDX of  difficult airways management almost all start with A and  include Adherence, Ace Inhibitors, Acid Reflux, Active Sinus Disease, Alpha 1 Antitripsin deficiency, Anxiety masquerading as Airways dz,  ABPA,  Allergy(esp in young), Aspiration (esp in elderly), Adverse effects of meds,  Active smokers, A bunch of PE's (a small clot burden can't cause this syndrome unless there is already severe underlying pulm or vascular dz with poor reserve) plus two Bs  = Bronchiectasis and Beta blocker use..and one C= CHF   Adherence is always the initial "prime suspect" and is a multilayered concern that requires a "trust but verify" approach in every patient - starting with knowing how to use medications, especially inhalers, correctly, keeping up with refills and understanding the fundamental difference between maintenance and prns vs those medications only taken for a very short course and then stopped and not refilled.  - see hfa teaching   ? Acid (or non-acid) GERD > always difficult to exclude as up to 75% of pts in some series report no assoc GI/ Heartburn symptoms> rec max (24h)  acid suppression and diet restrictions/ reviewed and instructions given in writing. (see uacs)  ? Allergy / asthma > Prednisone 10 mg take  4 each am x 2 days,   2 each am x 2 days,  1 each am x 2 days and stop   ? Active sinus dz > mucus has turned clear but next step should be sinus ct if not better

## 2017-01-23 NOTE — Telephone Encounter (Signed)
Patient advised and has appt this morning with pulmonologist.

## 2017-01-24 ENCOUNTER — Telehealth: Payer: Self-pay | Admitting: Internal Medicine

## 2017-01-24 ENCOUNTER — Encounter: Payer: Self-pay | Admitting: Internal Medicine

## 2017-01-24 NOTE — Assessment & Plan Note (Signed)
Body mass index is 32.67 kg/m.  -    Lab Results  Component Value Date   TSH 3.15 08/11/2014     Contributing to gerd risk/ doe/reviewed the need and the process to achieve and maintain neg calorie balance > defer f/u primary care including intermittently monitoring thyroid status

## 2017-01-24 NOTE — Telephone Encounter (Signed)
Notes recorded by Tanda Rockers, MD on 01/23/2017 at 5:00 PM EDT Call pt: Reviewed cxr and no acute change so no change in recommendations made at [facility] -------- Spoke with pt, aware of results/recs.  Nothing further needed.

## 2017-01-29 ENCOUNTER — Other Ambulatory Visit: Payer: Self-pay | Admitting: Family Medicine

## 2017-02-05 NOTE — Progress Notes (Signed)
[facility] [employer] PREP Weekly Session   Patient Details  Name: Sheila Shields MRN: [id number] Date of Birth: 04-02-1935 Age: 82 y.o. PCP: Lucretia Kern, DO  Vitals:   02/05/17 1130  Weight: 202 lb (91.6 kg)        [facility] [employer] Weekly seesion - 02/05/17 1100      Weekly Session   Topic Discussed Calorie breakdown   Classes attended to date 8      Fun things you did since last meeting:"Vacation" Things you are grateful for:"82th birthday"  [name] 02/05/2017, 11:31 AM

## 2017-02-13 ENCOUNTER — Other Ambulatory Visit: Payer: Self-pay | Admitting: Family Medicine

## 2017-02-14 NOTE — Progress Notes (Signed)
[facility] [employer] PREP Weekly Session   Patient Details  Name: Sheila Shields MRN: [id number] Date of Birth: Oct 02, 1934 Age: 82 y.o. PCP: Lucretia Kern, DO  Vitals:   02/14/17 1350  Weight: 202 lb (91.6 kg)        [facility] [employer] Weekly seesion - 02/14/17 1300      Weekly Session   Topic Discussed Hitting roadblocks   Minutes exercised this week 180 minutes  60cardio/60strength/38flexibility   Classes attended to date 61     Fun things you've done since last meeting:"AA conference" Things you are grateful for:"sunshine"  [name] 02/14/2017, 1:51 PM

## 2017-02-21 NOTE — Progress Notes (Signed)
Subjective:   Sheila Shields is a 82 y.o. female who presents for Medicare Annual (Subsequent) preventive examination.  The Patient was informed that the wellness visit is to identify future health risk and educate and initiate measures that can reduce risk for increased disease through the lifespan.    Annual Wellness Assessment  Reports health as fair But hx copd and viral cold since June;  Still has cough;  Just went to wedding and up to [place];  Went to a doctor there, Just Completed a z pack  Pulmonologist told him it was gerd recently and changed her medication.  Preventive Screening -Counseling & Management  Medicare Annual Preventive Care Visit - Subsequent Last OV 12/2016  Female preventive screens Colonoscopy 03/2007 - aged out  Mammogram; 10/2016 Dexa; 01/2015 NOrmal  Smoking hx x 30 pack years;  Did a chest xray normal   VS reviewed; BP good   Diet  Eats breakfast, lunch and dinner Does not fry; fresh vegetables  Bagel for breakfast Lunch varies ;smoothes;  2 scoops of ice cream; SB or peaches; mango;    BMI 32.9   Exercise (OA stenosis)  Can't walk due to hips or glut - massage 2 times   but does do silver sneaker program  Can't use hands as well   Safety;  Sunscreen when out in the sun; but has spray tan Hx of melanoma; see dermatology  Jarome Matin q 6 months  Hearing Screening Comments: q 2 years to go their ears checked  If there is noise in the background, she has issues discerning what is being said  Vision Screening Comments: Vision checks  q years Dr. Cena Benton;    Dental -no   Stressors: life   Sleep patterns: sleeps well   Pain - OA and back;    Cardiac Risk Factors Addressed Hyperlipidemia - chol 190 HDL 48; LDL 111; trig 192 Pre-diabetes a1c is 6.1; bs 101  Obesity- does not discuss weight loss but did discuss pre-diabetes   Advanced Directives  Patient Care Team: Lucretia Kern, DO as PCP - General (Family Medicine) Assessed for  additional providers  Immunization History  Administered Date(s) Administered  . Influenza Split 04/09/2012  . Influenza Whole 05/01/2007, 04/06/2008, 04/21/2009, 03/24/2010, 04/10/2011  . Influenza, High Dose Seasonal PF 03/17/2016  . Influenza,inj,Quad PF,36+ Mos 03/03/2013  . Influenza-Unspecified 04/09/2014, 04/09/2015  . Pneumococcal Conjugate-13 05/05/2015  . Pneumococcal Polysaccharide-23 04/09/2004  . Td 12/08/2005  . Tdap 12/22/2014  . Zoster 01/06/2008   Required Immunizations needed today  Screening test up to date or reviewed for plan of completion Health Maintenance Due  Topic Date Due  . INFLUENZA VACCINE  02/07/2017    Flu vaccine not available this time     Cardiac Risk Factors include: advanced age (>17men, >16 women);dyslipidemia;hypertension;obesity (BMI >30kg/m2)     Objective:     Vitals: BP 130/80   Pulse 89   Ht 5\' 6"  (1.676 m)   Wt 204 lb (92.5 kg)   SpO2 92%   BMI 32.93 kg/m   Body mass index is 32.93 kg/m.   Tobacco History  Smoking Status  . Former Smoker  . Packs/day: 2.00  . Years: 30.00  . Types: Cigarettes  . Quit date: 07/10/1984  Smokeless Tobacco  . Never Used     Counseling given: Yes   Past Medical History:  Diagnosis Date  . Alcoholism in remission ([facility]) 08/27/2007  . Anemia   . Arthritis    OA  .  COPD 12/17/2006  . Diverticulosis 03/27/2007  . GERD 05/01/2007  . HYPERGLYCEMIA 08/27/2007  . HYPERLIPIDEMIA 12/17/2006  . HYPERTENSION 12/17/2006  . Mood disorder ([facility])    hx anxiety and depression  . Rectal polyp 03/27/2007   adenoma   Past Surgical History:  Procedure Laterality Date  . ABDOMINAL HYSTERECTOMY  1978   fibroma  . APPENDECTOMY  2002  . BREAST BIOPSY     negative  . BREAST EXCISIONAL BIOPSY Right    2003  . CARPAL TUNNEL RELEASE Left 2010 or 2011  . COLONOSCOPY W/ POLYPECTOMY  03/27/2007; n7/19/2012   2008: 4 mm adenoma, diverticulosis 2012: ileitis ? NSAID - likely, 2-3 mm cecal polyp LYMPHOID  FOLLICLE, diverticulosis  . ESOPHAGOGASTRODUODENOSCOPY  01/26/2011   reflux esophagitis, colonscopy done also  . HAMMER TOE SURGERY  2008   left foot  . ORIF ANKLE FRACTURE Right 12/22/2014   Procedure: OPEN REDUCTION INTERNAL FIXATION (ORIF) ANKLE FRACTURE;  Surgeon: Susa Day, MD;  Location: [facility];  Service: Orthopedics;  Laterality: Right;  . SHOULDER OPEN ROTATOR CUFF REPAIR Left 03/06/2013   Procedure: LEFT ROTATOR CUFF REPAIR, SUBACROMIAL DECOMPRESSION, PATCH GRAFT, MANIPULATION UNDER ANESTHESIA;  Surgeon: Johnn Hai, MD;  Location: [facility];  Service: Orthopedics;  Laterality: Left;   Family History  Problem Relation Age of Onset  . Throat cancer Mother   . Heart attack Father 28  . Idiopathic pulmonary fibrosis Brother 85  . Cancer Brother        sarcoma  . Colon cancer Neg Hx    History  Sexual Activity  . Sexual activity: Not on file    Outpatient Encounter Prescriptions as of 02/22/2017  Medication Sig  . acetaminophen (ACETAMINOPHEN 8 HOUR) 650 MG CR tablet Take 650 mg by mouth 2 (two) times daily.  Marland Kitchen albuterol (PROAIR HFA) 108 (90 Base) MCG/ACT inhaler INHALE 2 PUFFS EVERY 4 HOURS AS NEEDED FOR COUGHING SPELLS  . aspirin 81 MG tablet Take 81 mg by mouth daily.  . cetirizine (ZYRTEC) 10 MG tablet Take 10 mg by mouth daily.  . Coenzyme Q10 (COQ10 PO) Take by mouth daily.  . ferrous sulfate 325 (65 FE) MG tablet TAKE 1 TABLET BY MOUTH EVERY DAY  . lansoprazole (PREVACID) 15 MG capsule Take 1 capsule (15 mg total) by mouth daily.  Marland Kitchen loratadine (CLARITIN) 10 MG tablet Take 1 tablet (10 mg total) by mouth daily. (Patient taking differently: Take 10 mg by mouth daily with breakfast. )  . losartan-hydrochlorothiazide (HYZAAR) 100-12.5 MG tablet TAKE 1 TABLET BY MOUTH DAILY  . Multiple Vitamin (MULTIVITAMIN WITH MINERALS) TABS tablet Take 1 tablet by mouth daily.  Marland Kitchen OVER THE COUNTER MEDICATION Allergy medication (name unknown)  . potassium chloride SA (K-DUR,KLOR-CON) 20  MEQ tablet TAKE 1 TABLET BY MOUTH DAILY  . simvastatin (ZOCOR) 20 MG tablet TAKE ONE TABLET BY MOUTH EVERY MORNING  . traMADol (ULTRAM) 50 MG tablet 1-2 every 4 hours as needed for cough or pain  . Vitamin D, Cholecalciferol, 1000 units CAPS Take 1 capsule by mouth daily.  Marland Kitchen azithromycin (ZITHROMAX) 250 MG tablet TAKE 2 TABLET (ORAL) 1 TIME PER DAY FOR 1 DAYS THEN TAKE 1 TABLET ON DAYS 2 THROUGH 5  . fish oil-omega-3 fatty acids 1000 MG capsule Take 2 g by mouth daily.  . [DISCONTINUED] acetaminophen (TYLENOL) 500 MG tablet Take 1,500 mg by mouth 2 (two) times daily. Reported on 09/28/2015  . [DISCONTINUED] predniSONE (DELTASONE) 10 MG tablet Take  4 each am x 2  days,   2 each am x 2 days,  1 each am x 2 days and stop (Patient not taking: Reported on 02/22/2017)   No facility-administered encounter medications on file as of 02/22/2017.     Activities of Daily Living In your present state of health, do you have any difficulty performing the following activities: 02/22/2017  Hearing? N  Vision? N  Difficulty concentrating or making decisions? N  Walking or climbing stairs? N  Dressing or bathing? N  Doing errands, shopping? N  Preparing Food and eating ? N  Using the Toilet? N  In the past six months, have you accidently leaked urine? N  Do you have problems with loss of bowel control? N  Managing your Medications? N  Managing your Finances? N  Housekeeping or managing your Housekeeping? N  Some recent data might be hidden    Patient Care Team: Lucretia Kern, DO as PCP - General (Family Medicine)    Assessment:     Exercise Activities and Dietary recommendations Current Exercise Habits: Home exercise routine, Time (Minutes): 60, Frequency (Times/Week): 3, Weekly Exercise (Minutes/Week): 180, Intensity: Moderate  Goals    . Weight (lb) < 190 lb (86.2 kg)          Will watch sweet intake- come up w a plan  Will consider cutting portions out    Fat free or low fat dairy  products Fish high in omega-3 acids ( salmon, tuna, trout) Fruits, such as apples, bananas, oranges, pears, prunes Legumes, such as kidney beans, lentils, checkpeas, black-eyed peas and lima beans Vegetables; broccoli, cabbage, carrots Whole grains;   Plant fats are better; decrease "white" foods as pasta, rice, bread and desserts, sugar; Avoid red meat (limiting) palm and coconut oils; sugary foods and beverages  Two nutrients that raise blood chol levels are saturated fats and trans fat; in hydrogenated oils and fats, as stick margarine, baked goods (cookes, cakes, pies, crackers; frosting; and coffee creamers;   Some Fats lower cholesterol: Monounsaturated and polyunsaturated  Avocados Corn, sunflower, and soybean oils Nuts and seeds, such as walnuts Olive, canola, peanut, safflower, and sesame oils Peanut butter Salmon and trout Tofu         Fall Risk Fall Risk  02/22/2017 01/20/2016 05/05/2015 09/03/2013  Falls in the past year? No No Yes Yes  Number falls in past yr: - - 1 1  Injury with Fall? - - Yes Yes   Depression Screen PHQ 2/9 Scores 02/22/2017 01/20/2016 09/03/2013  PHQ - 2 Score 0 0 0     Cognitive Function Ad8 score reviewed for issues:  Issues making decisions:  Less interest in hobbies / activities:  Repeats questions, stories (family complaining):  Trouble using ordinary gadgets (microwave, computer, phone):  Forgets the month or year:   Mismanaging finances:   Remembering appts:  Daily problems with thinking and/or memory: Ad8 score is=0  MMSE - Mini Mental State Exam 02/22/2017  Not completed: (No Data)        Immunization History  Administered Date(s) Administered  . Influenza Split 04/09/2012  . Influenza Whole 05/01/2007, 04/06/2008, 04/21/2009, 03/24/2010, 04/10/2011  . Influenza, High Dose Seasonal PF 03/17/2016  . Influenza,inj,Quad PF,36+ Mos 03/03/2013  . Influenza-Unspecified 04/09/2014, 04/09/2015  . Pneumococcal Conjugate-13  05/05/2015  . Pneumococcal Polysaccharide-23 04/09/2004  . Td 12/08/2005  . Tdap 12/22/2014  . Zoster 01/06/2008   Screening Tests Health Maintenance  Topic Date Due  . INFLUENZA VACCINE  02/07/2017  . TETANUS/TDAP  12/21/2024  .  DEXA SCAN  Completed  . PNA vac Low Risk Adult  Completed      Plan:      PCP Notes   Health Maintenance Flu vaccine can be taken this fall when available.  Educated regarding the shingrix. Goal was set to try to lose 10 pounds.  Abnormal Screens  The patient was educated regarding her A1c of 6.1. Given information on prediabetes verbally. Has decided that she will try to lose 10 pounds.  Referrals  No referrals today.  Patient concerns; Patient does still have a cough but did get a chest x-ray recently and it was negative. Was told by pulmonology that she did not have bronchitis. She was told that her cough was most likely Gerds and to increase her medication. The patient has not been taken her albuterol. Encouraged her to take her albuterol when here today and to continue as ordered by her pulmonologist every 4 times a day as needed for her cough. She has also finished a recent trial of prednisone. The patient felt like overall she is getting better and did not need to see Dr. Maudie Mercury today.  Nurse Concerns; As noted  Next PCP apt The patient recently seen Dr. Maudie Mercury and will have blood work drawn today before leaving. Her next appointment with Dr. Ninfa Meeker is October 8.     I have personally reviewed and noted the following in the patient's chart:   . Medical and social history . Use of alcohol, tobacco or illicit drugs  . Current medications and supplements . Functional ability and status . Nutritional status . Physical activity . Advanced directives . List of other physicians . Hospitalizations, surgeries, and ER visits in previous 12 months . Vitals . Screenings to include cognitive, depression, and falls . Referrals and  appointments  In addition, I have reviewed and discussed with patient certain preventive protocols, quality metrics, and best practice recommendations. A written personalized care plan for preventive services as well as general preventive health recommendations were provided to patient.     Wynetta Fines, RN  02/22/2017

## 2017-02-22 ENCOUNTER — Ambulatory Visit (INDEPENDENT_AMBULATORY_CARE_PROVIDER_SITE_OTHER): Payer: Medicare HMO

## 2017-02-22 VITALS — BP 130/80 | HR 89 | Ht 66.0 in | Wt 204.0 lb

## 2017-02-22 DIAGNOSIS — R739 Hyperglycemia, unspecified: Secondary | ICD-10-CM | POA: Diagnosis not present

## 2017-02-22 DIAGNOSIS — E785 Hyperlipidemia, unspecified: Secondary | ICD-10-CM | POA: Diagnosis not present

## 2017-02-22 DIAGNOSIS — I10 Essential (primary) hypertension: Secondary | ICD-10-CM

## 2017-02-22 DIAGNOSIS — Z0000 Encounter for general adult medical examination without abnormal findings: Secondary | ICD-10-CM

## 2017-02-22 LAB — CBC
HCT: 42.3 % (ref 36.0–46.0)
Hemoglobin: 14 g/dL (ref 12.0–15.0)
MCHC: 33.2 g/dL (ref 30.0–36.0)
MCV: 94.2 fl (ref 78.0–100.0)
PLATELETS: 263 10*3/uL (ref 150.0–400.0)
RBC: 4.49 Mil/uL (ref 3.87–5.11)
RDW: 13.9 % (ref 11.5–15.5)
WBC: 5.6 10*3/uL (ref 4.0–10.5)

## 2017-02-22 LAB — LIPID PANEL
Cholesterol: 173 mg/dL (ref 0–200)
HDL: 43.4 mg/dL (ref >39.00)
NONHDL: 129.25
Total CHOL/HDL Ratio: 4
Triglycerides: 269 mg/dL — ABNORMAL HIGH (ref 0.0–149.0)
VLDL: 53.8 mg/dL — ABNORMAL HIGH (ref 0.0–40.0)

## 2017-02-22 LAB — BASIC METABOLIC PANEL
BUN: 14 mg/dL (ref 6–23)
CALCIUM: 9.7 mg/dL (ref 8.4–10.5)
CO2: 30 mEq/L (ref 19–32)
CREATININE: 0.76 mg/dL (ref 0.40–1.20)
Chloride: 103 mEq/L (ref 96–112)
GFR: 77.81 mL/min (ref >60.00)
Glucose, Bld: 104 mg/dL — ABNORMAL HIGH (ref 70–99)
Potassium: 4 mEq/L (ref 3.5–5.1)
SODIUM: 141 meq/L (ref 135–145)

## 2017-02-22 LAB — HEMOGLOBIN A1C: HEMOGLOBIN A1C: 6.4 % (ref 4.6–6.5)

## 2017-02-22 LAB — LDL CHOLESTEROL, DIRECT: LDL DIRECT: 86 mg/dL

## 2017-02-22 NOTE — Progress Notes (Signed)
Sheila Shurley R., DO  

## 2017-02-22 NOTE — Patient Instructions (Addendum)
Sheila Shields , Thank you for taking time to come for your Medicare Wellness Visit. I appreciate your ongoing commitment to your health goals. Please review the following plan we discussed and let me know if I can assist you in the future.   Can use calorie king.com http://vang.com/  These are the goals we discussed: Goals    . Weight (lb) < 190 lb (86.2 kg)          Will watch sweet intake- come up w a plan  Will consider cutting portions out    Fat free or low fat dairy products Fish high in omega-3 acids ( salmon, tuna, trout) Fruits, such as apples, bananas, oranges, pears, prunes Legumes, such as kidney beans, lentils, checkpeas, black-eyed peas and lima beans Vegetables; broccoli, cabbage, carrots Whole grains;   Plant fats are better; decrease "white" foods as pasta, rice, bread and desserts, sugar; Avoid red meat (limiting) palm and coconut oils; sugary foods and beverages  Two nutrients that raise blood chol levels are saturated fats and trans fat; in hydrogenated oils and fats, as stick margarine, baked goods (cookes, cakes, pies, crackers; frosting; and coffee creamers;   Some Fats lower cholesterol: Monounsaturated and polyunsaturated  Avocados Corn, sunflower, and soybean oils Nuts and seeds, such as walnuts Olive, canola, peanut, safflower, and sesame oils Peanut butter Salmon and trout Tofu          This is a list of the screening recommended for you and due dates:  Health Maintenance  Topic Date Due  . Flu Shot  02/07/2017  . Tetanus Vaccine  12/21/2024  . DEXA scan (bone density measurement)  Completed  . Pneumonia vaccines  Completed   Prevention of falls: Remove rugs or any tripping hazards in the home Use Non slip mats in bathtubs and showers Placing grab bars next to the toilet and or shower Placing handrails on both sides of the stair way Adding extra lighting in the home.   Personal safety issues reviewed:  1. Consider starting a community watch  program per [facility] 2.  Changes batteries is smoke detector and/or carbon monoxide detector  3.  If you have firearms; keep them in a safe place 4.  Wear protection when in the sun; Always wear sunscreen or a hat; It is good to have your doctor check your skin annually or review any new areas of concern 5. Driving safety; Keep in the right lane; stay 3 car lengths behind the car in front of you on the highway; look 3 times prior to pulling out; carry your cell phone everywhere you go!    Learn about the Yellow Dot program:  The program allows first responders at your emergency to have access to who your physician is, as well as your medications and medical conditions.  Citizens requesting the Yellow Dot Packages should contact Master Corporal Nunzio Cobbs at the [facility] [phone] for the first week of the program and beginning the week after Easter citizens should contact their Scientist, physiological.    Health Maintenance for Postmenopausal Women Menopause is a normal process in which your reproductive ability comes to an end. This process happens gradually over a span of months to years, usually between the ages of 15 and 76. Menopause is complete when you have missed 12 consecutive menstrual periods. It is important to talk with your health care provider about some of the most common conditions that affect postmenopausal women, such as heart disease, cancer,  and bone loss (osteoporosis). Adopting a healthy lifestyle and getting preventive care can help to promote your health and wellness. Those actions can also lower your chances of developing some of these common conditions. What should I know about menopause? During menopause, you may experience a number of symptoms, such as:  Moderate-to-severe hot flashes.  Night sweats.  Decrease in sex drive.  Mood swings.  Headaches.  Tiredness.  Irritability.  Memory  problems.  Insomnia.  Choosing to treat or not to treat menopausal changes is an individual decision that you make with your health care provider. What should I know about hormone replacement therapy and supplements? Hormone therapy products are effective for treating symptoms that are associated with menopause, such as hot flashes and night sweats. Hormone replacement carries certain risks, especially as you become older. If you are thinking about using estrogen or estrogen with progestin treatments, discuss the benefits and risks with your health care provider. What should I know about heart disease and stroke? Heart disease, heart attack, and stroke become more likely as you age. This may be due, in part, to the hormonal changes that your body experiences during menopause. These can affect how your body processes dietary fats, triglycerides, and cholesterol. Heart attack and stroke are both medical emergencies. There are many things that you can do to help prevent heart disease and stroke:  Have your blood pressure checked at least every 1-2 years. High blood pressure causes heart disease and increases the risk of stroke.  If you are 55-79 years old, ask your health care provider if you should take aspirin to prevent a heart attack or a stroke.  Do not use any tobacco products, including cigarettes, chewing tobacco, or electronic cigarettes. If you need help quitting, ask your health care provider.  It is important to eat a healthy diet and maintain a healthy weight. ? Be sure to include plenty of vegetables, fruits, low-fat dairy products, and lean protein. ? Avoid eating foods that are high in solid fats, added sugars, or salt (sodium).  Get regular exercise. This is one of the most important things that you can do for your health. ? Try to exercise for at least 150 minutes each week. The type of exercise that you do should increase your heart rate and make you sweat. This is known as  moderate-intensity exercise. ? Try to do strengthening exercises at least twice each week. Do these in addition to the moderate-intensity exercise.  Know your numbers.Ask your health care provider to check your cholesterol and your blood glucose. Continue to have your blood tested as directed by your health care provider.  What should I know about cancer screening? There are several types of cancer. Take the following steps to reduce your risk and to catch any cancer development as early as possible. Breast Cancer  Practice breast self-awareness. ? This means understanding how your breasts normally appear and feel. ? It also means doing regular breast self-exams. Let your health care provider know about any changes, no matter how small.  If you are 40 or older, have a clinician do a breast exam (clinical breast exam or CBE) every year. Depending on your age, family history, and medical history, it may be recommended that you also have a yearly breast X-ray (mammogram).  If you have a family history of breast cancer, talk with your health care provider about genetic screening.  If you are at high risk for breast cancer, talk with your health care provider about   having an MRI and a mammogram every year.  Breast cancer (BRCA) gene test is recommended for women who have family members with BRCA-related cancers. Results of the assessment will determine the need for genetic counseling and BRCA1 and for BRCA2 testing. BRCA-related cancers include these types: ? Breast. This occurs in males or females. ? Ovarian. ? Tubal. This may also be called fallopian tube cancer. ? Cancer of the abdominal or pelvic lining (peritoneal cancer). ? Prostate. ? Pancreatic.  Cervical, Uterine, and Ovarian Cancer Your health care provider may recommend that you be screened regularly for cancer of the pelvic organs. These include your ovaries, uterus, and vagina. This screening involves a pelvic exam, which  includes checking for microscopic changes to the surface of your cervix (Pap test).  For women ages 21-65, health care providers may recommend a pelvic exam and a Pap test every three years. For women ages 68-65, they may recommend the Pap test and pelvic exam, combined with testing for human papilloma virus (HPV), every five years. Some types of HPV increase your risk of cervical cancer. Testing for HPV may also be done on women of any age who have unclear Pap test results.  Other health care providers may not recommend any screening for nonpregnant women who are considered low risk for pelvic cancer and have no symptoms. Ask your health care provider if a screening pelvic exam is right for you.  If you have had past treatment for cervical cancer or a condition that could lead to cancer, you need Pap tests and screening for cancer for at least 20 years after your treatment. If Pap tests have been discontinued for you, your risk factors (such as having a new sexual partner) need to be reassessed to determine if you should start having screenings again. Some women have medical problems that increase the chance of getting cervical cancer. In these cases, your health care provider may recommend that you have screening and Pap tests more often.  If you have a family history of uterine cancer or ovarian cancer, talk with your health care provider about genetic screening.  If you have vaginal bleeding after reaching menopause, tell your health care provider.  There are currently no reliable tests available to screen for ovarian cancer.  Lung Cancer Lung cancer screening is recommended for adults 63-66 years old who are at high risk for lung cancer because of a history of smoking. A yearly low-dose CT scan of the lungs is recommended if you:  Currently smoke.  Have a history of at least 30 pack-years of smoking and you currently smoke or have quit within the past 15 years. A pack-year is smoking an  average of one pack of cigarettes per day for one year.  Yearly screening should:  Continue until it has been 15 years since you quit.  Stop if you develop a health problem that would prevent you from having lung cancer treatment.  Colorectal Cancer  This type of cancer can be detected and can often be prevented.  Routine colorectal cancer screening usually begins at age 49 and continues through age 69.  If you have risk factors for colon cancer, your health care provider may recommend that you be screened at an earlier age.  If you have a family history of colorectal cancer, talk with your health care provider about genetic screening.  Your health care provider may also recommend using home test kits to check for hidden blood in your stool.  A small camera at  the end of a tube can be used to examine your colon directly (sigmoidoscopy or colonoscopy). This is done to check for the earliest forms of colorectal cancer.  Direct examination of the colon should be repeated every 5-10 years until age 75. However, if early forms of precancerous polyps or small growths are found or if you have a family history or genetic risk for colorectal cancer, you may need to be screened more often.  Skin Cancer  Check your skin from head to toe regularly.  Monitor any moles. Be sure to tell your health care provider: ? About any new moles or changes in moles, especially if there is a change in a mole's shape or color. ? If you have a mole that is larger than the size of a pencil eraser.  If any of your family members has a history of skin cancer, especially at a young age, talk with your health care provider about genetic screening.  Always use sunscreen. Apply sunscreen liberally and repeatedly throughout the day.  Whenever you are outside, protect yourself by wearing long sleeves, pants, a wide-brimmed hat, and sunglasses.  What should I know about osteoporosis? Osteoporosis is a condition in  which bone destruction happens more quickly than new bone creation. After menopause, you may be at an increased risk for osteoporosis. To help prevent osteoporosis or the bone fractures that can happen because of osteoporosis, the following is recommended:  If you are 19-50 years old, get at least 1,000 mg of calcium and at least 600 mg of vitamin D per day.  If you are older than age 50 but younger than age 70, get at least 1,200 mg of calcium and at least 600 mg of vitamin D per day.  If you are older than age 70, get at least 1,200 mg of calcium and at least 800 mg of vitamin D per day.  Smoking and excessive alcohol intake increase the risk of osteoporosis. Eat foods that are rich in calcium and vitamin D, and do weight-bearing exercises several times each week as directed by your health care provider. What should I know about how menopause affects my mental health? Depression may occur at any age, but it is more common as you become older. Common symptoms of depression include:  Low or sad mood.  Changes in sleep patterns.  Changes in appetite or eating patterns.  Feeling an overall lack of motivation or enjoyment of activities that you previously enjoyed.  Frequent crying spells.  Talk with your health care provider if you think that you are experiencing depression. What should I know about immunizations? It is important that you get and maintain your immunizations. These include:  Tetanus, diphtheria, and pertussis (Tdap) booster vaccine.  Influenza every year before the flu season begins.  Pneumonia vaccine.  Shingles vaccine.  Your health care provider may also recommend other immunizations. This information is not intended to replace advice given to you by your health care provider. Make sure you discuss any questions you have with your health care provider. Document Released: 08/18/2005 Document Revised: 01/14/2016 Document Reviewed: 03/30/2015 Elsevier Interactive  Patient Education  2018 Else[employer] Inc.    Fall Prevention in the Home Falls can cause injuries and can affect people from all age groups. There are many simple things that you can do to make your home safe and to help prevent falls. What can I do on the outside of my home?  Regularly repair the edges of walkways and driveways and fix   any cracks.  Remove high doorway thresholds.  Trim any shrubbery on the main path into your home.  Use bright outdoor lighting.  Clear walkways of debris and clutter, including tools and rocks.  Regularly check that handrails are securely fastened and in good repair. Both sides of any steps should have handrails.  Install guardrails along the edges of any raised decks or porches.  Have leaves, snow, and ice cleared regularly.  Use sand or salt on walkways during winter months.  In the garage, clean up any spills right away, including grease or oil spills. What can I do in the bathroom?  Use night lights.  Install grab bars by the toilet and in the tub and shower. Do not use towel bars as grab bars.  Use non-skid mats or decals on the floor of the tub or shower.  If you need to sit down while you are in the shower, use a plastic, non-slip stool.  Keep the floor dry. Immediately clean up any water that spills on the floor.  Remove soap buildup in the tub or shower on a regular basis.  Attach bath mats securely with double-sided non-slip rug tape.  Remove throw rugs and other tripping hazards from the floor. What can I do in the bedroom?  Use night lights.  Make sure that a bedside light is easy to reach.  Do not use oversized bedding that drapes onto the floor.  Have a firm chair that has side arms to use for getting dressed.  Remove throw rugs and other tripping hazards from the floor. What can I do in the kitchen?  Clean up any spills right away.  Avoid walking on wet floors.  Place frequently used items in easy-to-reach  places.  If you need to reach for something above you, use a sturdy step stool that has a grab bar.  Keep electrical cables out of the way.  Do not use floor polish or wax that makes floors slippery. If you have to use wax, make sure that it is non-skid floor wax.  Remove throw rugs and other tripping hazards from the floor. What can I do in the stairways?  Do not leave any items on the stairs.  Make sure that there are handrails on both sides of the stairs. Fix handrails that are broken or loose. Make sure that handrails are as long as the stairways.  Check any carpeting to make sure that it is firmly attached to the stairs. Fix any carpet that is loose or worn.  Avoid having throw rugs at the top or bottom of stairways, or secure the rugs with carpet tape to prevent them from moving.  Make sure that you have a light switch at the top of the stairs and the bottom of the stairs. If you do not have them, have them installed. What are some other fall prevention tips?  Wear closed-toe shoes that fit well and support your feet. Wear shoes that have rubber soles or low heels.  When you use a stepladder, make sure that it is completely opened and that the sides are firmly locked. Have someone hold the ladder while you are using it. Do not climb a closed stepladder.  Add color or contrast paint or tape to grab bars and handrails in your home. Place contrasting color strips on the first and last steps.  Use mobility aids as needed, such as canes, walkers, scooters, and crutches.  Turn on lights if it is dark. Replace any light   bulbs that burn out.  Set up furniture so that there are clear paths. Keep the furniture in the same spot.  Fix any uneven floor surfaces.  Choose a carpet design that does not hide the edge of steps of a stairway.  Be aware of any and all pets.  Review your medicines with your healthcare provider. Some medicines can cause dizziness or changes in blood pressure,  which increase your risk of falling. Talk with your health care provider about other ways that you can decrease your risk of falls. This may include working with a physical therapist or trainer to improve your strength, balance, and endurance. This information is not intended to replace advice given to you by your health care provider. Make sure you discuss any questions you have with your health care provider. Document Released: 06/16/2002 Document Revised: 11/23/2015 Document Reviewed: 07/31/2014 Elsevier Interactive Patient Education  2017 [employer] Inc.  [employer] [facility] [employer] 

## 2017-02-26 ENCOUNTER — Encounter: Payer: Self-pay | Admitting: Emergency Medicine

## 2017-02-26 ENCOUNTER — Encounter: Payer: Self-pay | Admitting: Family Medicine

## 2017-02-26 NOTE — Telephone Encounter (Signed)
I spoke with patient on the phone and offered her an appt with Noe Gens on 03/08/17 at 9am. Patient took the appointment. Apologized for [place] not being able to see her sooner. She verbalized understanding. Nothing else was needed at time of call.

## 2017-03-02 NOTE — Progress Notes (Signed)
[facility] PREP Weekly Session   Patient Details  Name: Sheila Shields MRN: [id number] Date of Birth: Oct 08, 1934 Age: 82 y.o. PCP: Lucretia Kern, DO  Vitals:   03/02/17 1237  Weight: 200 lb (90.7 kg)        [facility] Weekly seesion - 03/02/17 1200      Weekly Session   Topic Discussed Other ways to be active   Minutes exercised this week 120 minutes   Classes attended to date 10     Fun things you did:"relaxed" Things you are grateful for:"sobriety" Nutrition celebrations:" -2 lbs" Barriers:"medical system"  [name] 03/02/2017, 12:38 PM

## 2017-03-08 ENCOUNTER — Telehealth: Payer: Self-pay

## 2017-03-08 ENCOUNTER — Ambulatory Visit (INDEPENDENT_AMBULATORY_CARE_PROVIDER_SITE_OTHER): Payer: Medicare HMO | Admitting: Gastroenterology

## 2017-03-08 ENCOUNTER — Encounter: Payer: Self-pay | Admitting: Gastroenterology

## 2017-03-08 ENCOUNTER — Ambulatory Visit: Payer: Medicare HMO | Admitting: Pulmonary Disease

## 2017-03-08 VITALS — BP 136/72 | HR 70 | Ht 66.0 in | Wt 202.0 lb

## 2017-03-08 DIAGNOSIS — K219 Gastro-esophageal reflux disease without esophagitis: Secondary | ICD-10-CM | POA: Diagnosis not present

## 2017-03-08 DIAGNOSIS — R05 Cough: Secondary | ICD-10-CM

## 2017-03-08 DIAGNOSIS — R053 Chronic cough: Secondary | ICD-10-CM

## 2017-03-08 MED ORDER — PANTOPRAZOLE SODIUM 40 MG PO TBEC: 40.0000 mg | DELAYED_RELEASE_TABLET | Freq: Two times a day (BID) | ORAL | 1 refills | Status: DC

## 2017-03-08 MED ORDER — RANITIDINE HCL 300 MG PO TABS: 300.0000 mg | ORAL_TABLET | Freq: Every day | ORAL | 1 refills | Status: DC

## 2017-03-08 NOTE — Progress Notes (Addendum)
03/08/2017 Sheila Shields [id number] May 13, 1935   HISTORY OF PRESENT ILLNESS:  This is a pleasant 82 year old female who is known to Dr. Carlean Purl.  She has not been seen here in several years. She is here today at the request of her PCP, Dr. Eloise Harman, for evaluation regarding a chronic cough. She tells me that she went on a trip in June and when she returned she developed a severe cough. She was treated for bronchitis and that acute episode of bronchitis resolved, but she has continued to have a significant cough. She was treated not only with antibiotics but also with 2 courses of prednisone and the cough does seem to improve with prednisone, but does not completely resolved and then continues once prednisone is complete. She has reflux and had been on Prevacid 15 mg daily for quite some time. She feels that has controlled her reflux symptoms well. Anyway, pulmonary recently increased her Prevacid to 30 mg twice a day about 3 or 4 weeks ago to see if that would help, thinking that her cough may be due to reflux. So far she does not notice any difference with that.   Past Medical History:  Diagnosis Date  . Alcoholism in remission ([place]) 08/27/2007  . Anemia   . Arthritis    OA  . COPD 12/17/2006  . Diverticulosis 03/27/2007  . GERD 05/01/2007  . HYPERGLYCEMIA 08/27/2007  . HYPERLIPIDEMIA 12/17/2006  . HYPERTENSION 12/17/2006  . Mood disorder (HCC)    hx anxiety and depression  . Rectal polyp 03/27/2007   adenoma   Past Surgical History:  Procedure Laterality Date  . ABDOMINAL HYSTERECTOMY  1978   fibroma  . APPENDECTOMY  2002  . BREAST BIOPSY     negative  . BREAST EXCISIONAL BIOPSY Right    2003  . CARPAL TUNNEL RELEASE Left 2010 or 2011  . COLONOSCOPY W/ POLYPECTOMY  03/27/2007; n7/19/2012   2008: 4 mm adenoma, diverticulosis 2012: ileitis ? NSAID - likely, 2-3 mm cecal polyp LYMPHOID FOLLICLE, diverticulosis  . ESOPHAGOGASTRODUODENOSCOPY  01/26/2011   reflux esophagitis, colonscopy  done also  . HAMMER TOE SURGERY  2008   left foot  . HERNIA REPAIR Right 1996?  . ORIF ANKLE FRACTURE Right 12/22/2014   Procedure: OPEN REDUCTION INTERNAL FIXATION (ORIF) ANKLE FRACTURE;  Surgeon: Susa Day, MD;  Location: [facility];  Service: Orthopedics;  Laterality: Right;  . SHOULDER OPEN ROTATOR CUFF REPAIR Left 03/06/2013   Procedure: LEFT ROTATOR CUFF REPAIR, SUBACROMIAL DECOMPRESSION, PATCH GRAFT, MANIPULATION UNDER ANESTHESIA;  Surgeon: Johnn Hai, MD;  Location: [facility];  Service: Orthopedics;  Laterality: Left;    reports that she quit smoking about 32 years ago. Her smoking use included Cigarettes. She has a 60.00 pack-year smoking history. She has never used smokeless tobacco. She reports that she does not drink alcohol or use drugs. family history includes Cancer in her brother; Heart attack (age of onset: 36) in her father; Heart disease in her brother; Idiopathic pulmonary fibrosis (age of onset: 60) in her brother; Throat cancer in her mother. Allergies  Allergen Reactions  . Dilaudid [Hydromorphone Hcl] Other (See Comments)    Pt had hypotension after dilaudid 1mg  IV while in the OR, required temporary pressor intervention.      Outpatient Encounter Prescriptions as of 03/08/2017  Medication Sig  . acetaminophen (ACETAMINOPHEN 8 HOUR) 650 MG CR tablet Take 650 mg by mouth 2 (two) times daily.  Marland Kitchen albuterol (PROAIR HFA) 108 (90 Base) MCG/ACT inhaler  INHALE 2 PUFFS EVERY 4 HOURS AS NEEDED FOR COUGHING SPELLS  . aspirin 81 MG tablet Take 81 mg by mouth daily.  . cetirizine (ZYRTEC) 10 MG tablet Take 10 mg by mouth daily.  . Chlorpheniramine Maleate (CHLOR-TABLETS PO) Take by mouth 2 (two) times daily.  . Coenzyme Q10 (COQ10 PO) Take by mouth daily.  . ferrous sulfate 325 (65 FE) MG tablet TAKE 1 TABLET BY MOUTH EVERY DAY  . fish oil-omega-3 fatty acids 1000 MG capsule Take 2 g by mouth daily.  . lansoprazole (PREVACID) 15 MG capsule Take 1 capsule (15 mg total) by mouth  daily.  Marland Kitchen loratadine (CLARITIN) 10 MG tablet Take 1 tablet (10 mg total) by mouth daily. (Patient taking differently: Take 10 mg by mouth daily with breakfast. )  . losartan-hydrochlorothiazide (HYZAAR) 100-12.5 MG tablet TAKE 1 TABLET BY MOUTH DAILY  . Multiple Vitamin (MULTIVITAMIN WITH MINERALS) TABS tablet Take 1 tablet by mouth daily.  . potassium chloride SA (K-DUR,KLOR-CON) 20 MEQ tablet TAKE 1 TABLET BY MOUTH DAILY  . simvastatin (ZOCOR) 20 MG tablet TAKE ONE TABLET BY MOUTH EVERY MORNING  . traMADol (ULTRAM) 50 MG tablet 1-2 every 4 hours as needed for cough or pain  . Vitamin D, Cholecalciferol, 1000 units CAPS Take 1 capsule by mouth daily.  . [DISCONTINUED] azithromycin (ZITHROMAX) 250 MG tablet TAKE 2 TABLET (ORAL) 1 TIME PER DAY FOR 1 DAYS THEN TAKE 1 TABLET ON DAYS 2 THROUGH 5  . [DISCONTINUED] OVER THE COUNTER MEDICATION Allergy medication (name unknown)   No facility-administered encounter medications on file as of 03/08/2017.      REVIEW OF SYSTEMS  : All other systems reviewed and negative except where noted in the History of Present Illness.   PHYSICAL EXAM: BP 136/72   Pulse 70   Ht 5\' 6"  (1.676 m)   Wt 202 lb (91.6 kg)   BMI 32.60 kg/m  General: Well developed white female in no acute distress Head: Normocephalic and atraumatic Eyes:  Sclerae anicteric, conjunctiva pink. Ears: Normal auditory acuity Lungs: Clear throughout to auscultation; non increased WOB. Heart: Regular rate and rhythm; no M/R/G. Abdomen: Soft, non-distended.  BS present.  Non-tender. Musculoskeletal: Symmetrical with no gross deformities  Skin: No lesions on visible extremities Extremities: No edema  Neurological: Alert oriented x 4, grossly non-focal Psychological:  Alert and cooperative. Normal mood and affect  ASSESSMENT AND PLAN: *82 year old female with severe cough since June.  Has been persistent after treatment of bronchitis at that time.  No other reflux symptoms and I doubt  that this is due to uncontrolled reflux but really the only way to know would be to maximize her acid suppression and see if she responds. I'm going to change her PPI. We will discontinue her Prevacid and switch to pantoprazole 40 mg twice a day.  We will also add Zantac 300 mg at bedtime. If her cough does not respond to this after 4 weeks and it is still strongly suspected that this is due to reflux she could undergo a 24-hour pH study. In the interim I would like her to see her allergist and we will also make referral to ENT as well.  Will follow-up here in about 4 weeks.   CC:  Lucretia Kern, DO  Agree with Ms. Alphia Kava management.  Gatha Mayer, MD, [facility]

## 2017-03-08 NOTE — Patient Instructions (Addendum)
If you are age 80 or older, your body mass index should be between 23-30. Your Body mass index is 32.6 kg/m. If this is out of the aforementioned range listed, please consider follow up with your Primary Care Provider.  If you are age 26 or younger, your body mass index should be between 19-25. Your Body mass index is 32.6 kg/m. If this is out of the aformentioned range listed, please consider follow up with your Primary Care Provider.   We have sent the following medications to your pharmacy for you to pick up at your convenience: Pantoprazole Ranitidine  DISCONTINUE Prevacid.  See allergist.  We are referring you to Dr. Redmond Baseman, ENT for chronic cough.  We will contact you with an appointment.  Follow up with Alonza Bogus, PA-C on 04/03/17 at 9:30 a.m.  Thank you for choosing me and [facility].   Alonza Bogus, PA-C

## 2017-03-08 NOTE — Telephone Encounter (Signed)
Left message on pts voicemail regarding her Appt with Dr. Redmond Baseman for Sept. 13th at 1:00 pm

## 2017-03-09 ENCOUNTER — Telehealth: Payer: Self-pay | Admitting: Emergency Medicine

## 2017-03-09 NOTE — Telephone Encounter (Signed)
ATC the office, but they are closed for the day. We will need a medical records release before sending any information to them.

## 2017-03-09 NOTE — Progress Notes (Signed)
[place] Report   Patient Details  Name: Sheila Shields MRN: [id number] Date of Birth: 1934/09/16 Age: 82 y.o. PCP: Lucretia Kern, DO  Vitals:   03/09/17 1141  BP: 138/80  Pulse: 80  Resp: 20  SpO2: 94%  Weight: 202 lb 3.2 oz (91.7 kg)         [facility] Eval - 03/09/17 1100      Referral    Referring Provider Out pt rehab-Kate Zigmund Daniel   Reason for referral Obesitity/Overweight;Orthopedic;Inactivity;High Cholesterol;Hypertension;Other  pulmonary     Measurement   Waist Circumference 40.5 inches   Hip Circumference 47 inches   Body fat 46.2 percent     Information for Trainer   Goals States she doesn't have to stop while she's walking as much as she used to when she started.       Timed Up and Go (TUGS)   Timed Up and Go Moderate risk 10-12 seconds  11.09 seconds. Didn't use hands to get up or down     Mobility and Daily Activities   I find it easy to walk up or down two or more flights of stairs. 2   I have no trouble taking out the trash. 4   I do housework such as vacuuming and dusting on my own without difficulty. 4   I can easily lift a gallon of milk (8lbs). 4   I can easily walk a mile. 1   I have no trouble reaching into high cupboards or reaching down to pick up something from the floor. 3   I do not have trouble doing out-door work such as Armed forces logistics/support/administrative officer, raking leaves, or gardening. 3     Mobility and Daily Activities   I feel younger than my age. 4   I feel independent. 4   I feel energetic. 3   I live an active life.  4   I feel strong. 4   I feel healthy. 3   I feel active as other people my age. 4     How fit and strong are you.   Fit and Strong Total Score 47     Past Medical History:  Diagnosis Date  . Alcoholism in remission ([place]) 08/27/2007  . Anemia   . Arthritis    OA  . COPD 12/17/2006  . Diverticulosis 03/27/2007  . GERD 05/01/2007  . HYPERGLYCEMIA 08/27/2007  . HYPERLIPIDEMIA 12/17/2006  . HYPERTENSION 12/17/2006   . Mood disorder (HCC)    hx anxiety and depression  . Rectal polyp 03/27/2007   adenoma   Past Surgical History:  Procedure Laterality Date  . ABDOMINAL HYSTERECTOMY  1978   fibroma  . APPENDECTOMY  2002  . BREAST BIOPSY     negative  . BREAST EXCISIONAL BIOPSY Right    2003  . CARPAL TUNNEL RELEASE Left 2010 or 2011  . COLONOSCOPY W/ POLYPECTOMY  03/27/2007; n7/19/2012   2008: 4 mm adenoma, diverticulosis 2012: ileitis ? NSAID - likely, 2-3 mm cecal polyp LYMPHOID FOLLICLE, diverticulosis  . ESOPHAGOGASTRODUODENOSCOPY  01/26/2011   reflux esophagitis, colonscopy done also  . HAMMER TOE SURGERY  2008   left foot  . HERNIA REPAIR Right 1996?  . ORIF ANKLE FRACTURE Right 12/22/2014   Procedure: OPEN REDUCTION INTERNAL FIXATION (ORIF) ANKLE FRACTURE;  Surgeon: Susa Day, MD;  Location: [facility];  Service: Orthopedics;  Laterality: Right;  . SHOULDER OPEN ROTATOR CUFF REPAIR Left 03/06/2013   Procedure: LEFT ROTATOR CUFF REPAIR, SUBACROMIAL DECOMPRESSION,  PATCH GRAFT, MANIPULATION UNDER ANESTHESIA;  Surgeon: Johnn Hai, MD;  Location: [facility];  Service: Orthopedics;  Laterality: Left;   History  Smoking Status  . Former Smoker  . Packs/day: 2.00  . Years: 30.00  . Types: Cigarettes  . Quit date: 07/10/1984  Smokeless Tobacco  . Never Used     12 Wellness sessions attended.  Comments  Sheila Shields has come to a point of really enjoying her workouts.  She has been doing a majority of [employer] and loves them.  She is going to also explore [employer] yoga classes as well.  She states that her confidence is much greater now in the fitness center using equipment and her endurance and strength are greatly improved.  She did have a period of time she couldn't work out very much due to a recurrent cough but will be seeing a GI doctor to see if she can get a diagnosis.  She also states she will periodically come to the weekly class to stay accountable.      Sheila Shields 03/09/2017, 11:48 AM

## 2017-03-13 NOTE — Telephone Encounter (Signed)
Called the office and was placed on hold.  Will try back later.

## 2017-03-14 DIAGNOSIS — R05 Cough: Secondary | ICD-10-CM | POA: Diagnosis not present

## 2017-03-14 DIAGNOSIS — J300 Vasomotor rhinitis: Secondary | ICD-10-CM | POA: Diagnosis not present

## 2017-03-14 NOTE — Telephone Encounter (Signed)
Spoke with Dr. Seward Meth office, requesting last OV notes to be faxed over.  These have been faxed.  Nothing further needed.

## 2017-03-22 ENCOUNTER — Encounter: Payer: Self-pay | Admitting: Family Medicine

## 2017-03-22 DIAGNOSIS — R05 Cough: Secondary | ICD-10-CM | POA: Diagnosis not present

## 2017-03-26 MED ORDER — HYDROCHLOROTHIAZIDE 25 MG PO TABS: 25.0000 mg | ORAL_TABLET | Freq: Every day | ORAL | 1 refills | Status: DC

## 2017-03-29 ENCOUNTER — Encounter: Payer: Self-pay | Admitting: Family Medicine

## 2017-04-03 ENCOUNTER — Ambulatory Visit (INDEPENDENT_AMBULATORY_CARE_PROVIDER_SITE_OTHER): Payer: Medicare HMO | Admitting: Gastroenterology

## 2017-04-03 ENCOUNTER — Encounter: Payer: Self-pay | Admitting: Gastroenterology

## 2017-04-03 VITALS — BP 162/84 | HR 72 | Ht 65.0 in | Wt 196.0 lb

## 2017-04-03 DIAGNOSIS — R05 Cough: Secondary | ICD-10-CM

## 2017-04-03 DIAGNOSIS — K219 Gastro-esophageal reflux disease without esophagitis: Secondary | ICD-10-CM | POA: Diagnosis not present

## 2017-04-03 DIAGNOSIS — R053 Chronic cough: Secondary | ICD-10-CM

## 2017-04-03 NOTE — Patient Instructions (Addendum)
Decrease Pantoprazole Sodium 40 mg  to once daily.   Call back if you have any problems.   If you are age 80 or older, your body mass index should be between 23-30. Your Body mass index is 32.62 kg/m. If this is out of the aforementioned range listed, please consider follow up with your Primary Care Provider.

## 2017-04-03 NOTE — Progress Notes (Addendum)
04/03/2017 Sheila Shields [id number] 20-Apr-1935   HISTORY OF PRESENT ILLNESS:  Sheila Shields is a pleasant 82 year old female who is here for follow-up of a chronic cough. I saw her here last month in from a GI standpoint we changed her PPI and increase to twice daily as well as having her use Zantac at bedtime. She had artery seen pulmonary and was using an inhaler. Since I saw her she visited with ENT and her allergist. Had a nasal laryngoscopy that was reportedly normal. She tells me that she is unsure which regimen actually helped, but she has been cough free now for 2 weeks. She is on an inhaler, 2 nasal sprays, Zyrtec, pantoprazole 40 mg twice daily, and Zantac at bedtime. Also, her losartan was discontinued.  She is very happy to have resolved this issue.   Past Medical History:  Diagnosis Date  . Alcoholism in remission ([place]) 08/27/2007  . Anemia   . Arthritis    OA  . COPD 12/17/2006  . Diverticulosis 03/27/2007  . GERD 05/01/2007  . HYPERGLYCEMIA 08/27/2007  . HYPERLIPIDEMIA 12/17/2006  . HYPERTENSION 12/17/2006  . Mood disorder (HCC)    hx anxiety and depression  . Rectal polyp 03/27/2007   adenoma   Past Surgical History:  Procedure Laterality Date  . ABDOMINAL HYSTERECTOMY  1978   fibroma  . APPENDECTOMY  2002  . BREAST BIOPSY     negative  . BREAST EXCISIONAL BIOPSY Right    2003  . CARPAL TUNNEL RELEASE Left 2010 or 2011  . COLONOSCOPY W/ POLYPECTOMY  03/27/2007; n7/19/2012   2008: 4 mm adenoma, diverticulosis 2012: ileitis ? NSAID - likely, 2-3 mm cecal polyp LYMPHOID FOLLICLE, diverticulosis  . ESOPHAGOGASTRODUODENOSCOPY  01/26/2011   reflux esophagitis, colonscopy done also  . HAMMER TOE SURGERY  2008   left foot  . HERNIA REPAIR Right 1996?  . ORIF ANKLE FRACTURE Right 12/22/2014   Procedure: OPEN REDUCTION INTERNAL FIXATION (ORIF) ANKLE FRACTURE;  Surgeon: Susa Day, MD;  Location: [facility];  Service: Orthopedics;  Laterality: Right;  . SHOULDER OPEN ROTATOR CUFF  REPAIR Left 03/06/2013   Procedure: LEFT ROTATOR CUFF REPAIR, SUBACROMIAL DECOMPRESSION, PATCH GRAFT, MANIPULATION UNDER ANESTHESIA;  Surgeon: Johnn Hai, MD;  Location: [facility];  Service: Orthopedics;  Laterality: Left;    reports that she quit smoking about 32 years ago. Her smoking use included Cigarettes. She has a 60.00 pack-year smoking history. She has never used smokeless tobacco. She reports that she does not drink alcohol or use drugs. family history includes Cancer in her brother; Heart attack (age of onset: 82) in her father; Heart disease in her brother; Idiopathic pulmonary fibrosis (age of onset: 49) in her brother; Throat cancer in her mother. Allergies  Allergen Reactions  . Dilaudid [Hydromorphone Hcl] Other (See Comments)    Pt had hypotension after dilaudid 1mg  IV while in the OR, required temporary pressor intervention.      Outpatient Encounter Prescriptions as of 04/03/2017  Medication Sig  . acetaminophen ([employer] ARTHRITIS PAIN RELIEF) 650 MG CR tablet Take 1,300 mg by mouth 2 (two) times daily.  Marland Kitchen albuterol (PROAIR HFA) 108 (90 Base) MCG/ACT inhaler INHALE 2 PUFFS EVERY 4 HOURS AS NEEDED FOR COUGHING SPELLS  . aspirin 81 MG tablet Take 81 mg by mouth daily.  Marland Kitchen azelastine (ASTELIN) 0.1 % nasal spray Place 1 spray into both nostrils 2 (two) times daily. Use in each nostril as directed  . cetirizine (ZYRTEC) 10 MG tablet Take 10  mg by mouth as needed.   . Chlorpheniramine Maleate (CHLOR-TABLETS PO) Take by mouth 2 (two) times daily.  . Coenzyme Q10 (COQ10 PO) Take by mouth daily.  . ferrous sulfate 325 (65 FE) MG tablet TAKE 1 TABLET BY MOUTH EVERY DAY  . hydrochlorothiazide (HYDRODIURIL) 25 MG tablet Take 1 tablet (25 mg total) by mouth daily.  Marland Kitchen ipratropium (ATROVENT) 0.06 % nasal spray Place 2 sprays into the nose 3 (three) times daily.  Marland Kitchen loratadine (CLARITIN) 10 MG tablet Take 1 tablet (10 mg total) by mouth daily. (Patient taking differently: Take 10 mg by mouth as  needed. )  . Multiple Vitamin (MULTIVITAMIN WITH MINERALS) TABS tablet Take 1 tablet by mouth daily.  . pantoprazole (PROTONIX) 40 MG tablet Take 1 tablet (40 mg total) by mouth 2 (two) times daily.  . potassium chloride SA (K-DUR,KLOR-CON) 20 MEQ tablet TAKE 1 TABLET BY MOUTH DAILY  . ranitidine (ZANTAC) 300 MG tablet Take 1 tablet (300 mg total) by mouth at bedtime. (Patient taking differently: Take 300 mg by mouth as needed. )  . simvastatin (ZOCOR) 20 MG tablet TAKE ONE TABLET BY MOUTH EVERY MORNING  . traMADol (ULTRAM) 50 MG tablet 1-2 every 4 hours as needed for cough or pain  . Vitamin D, Cholecalciferol, 1000 units CAPS Take 1 capsule by mouth daily.  . [DISCONTINUED] acetaminophen (ACETAMINOPHEN 8 HOUR) 650 MG CR tablet Take 650 mg by mouth 2 (two) times daily.  . [DISCONTINUED] fish oil-omega-3 fatty acids 1000 MG capsule Take 2 g by mouth daily.  . [DISCONTINUED] losartan-hydrochlorothiazide (HYZAAR) 100-12.5 MG tablet TAKE 1 TABLET BY MOUTH DAILY   No facility-administered encounter medications on file as of 04/03/2017.      REVIEW OF SYSTEMS  : All other systems reviewed and negative except where noted in the History of Present Illness.   PHYSICAL EXAM: BP (!) 162/84 (BP Location: Left Arm, Patient Position: Sitting, Cuff Size: Large)   Pulse 72   Ht 5\' 5"  (1.651 m) Comment: height measured without shoes  Wt 196 lb (88.9 kg)   BMI 32.62 kg/m  General: Well developed white female in no acute distress Head: Normocephalic and atraumatic Eyes:  Sclerae anicteric, conjunctiva pink. Ears: Normal auditory acuity Lungs: Clear throughout to auscultation; no increased WOB. Heart: Regular rate and rhythm; no M/R/G. Abdomen: Soft, non-distended.  BS present.  Non-tender. Musculoskeletal: Symmetrical with no gross deformities  Skin: No lesions on visible extremities Extremities: No edema  Neurological: Alert oriented x 4, grossly non-focal Psychological:  Alert and cooperative.  Normal mood and affect  ASSESSMENT AND PLAN: *Chronic cough:  Unsure which regimen actually helped, but she has been cough free now for 2 weeks. She is on an inhaler, 2 nasal sprays, Zyrtec, pantoprazole 40 mg twice daily, and Zantac at bedtime. Also, her losartan was discontinued.  From a GI standpoint I am going to have her decrease her pantoprazole to once daily and see how she does with that.   CC:  Lucretia Kern, DO  Agree with Ms. Alphia Kava management.  Gatha Mayer, MD, [facility]

## 2017-04-13 NOTE — Progress Notes (Signed)
HPI:  Sheila Shields is a pleasant 82 y.o. here for follow up. Chronic medical problems summarized below were reviewed for changes. Reports she feels great! She has been doing calorie counts on a nap on her phone and also started silver sneakers and has lost over 10 pounds. Her cough has resolved and she is so happy about this. We do need to check her potassium today since we upped her diuretic a little bit and she has a history of low potassium on this. She is tolerating this well though and wishes to continue. She wants her flu shot today. She will be seeing her orthopedic doctor about a mass on her left arm near her elbow. Reports she has had this for a long time was told not to worry about it, but she feels it is uncomfortable. Denies CP, SOB, DOE, treatment intolerance or new symptoms. Due for flu shot  AWV 02/22/17  HTN: -meds: asa, losartan-hctz 100-12.5 in the past -now hctz 26 as wanted to try trial of losartan 2ndary to chronic cough  GERD: -prevacid 15  HLD/Obesity/Hyperglycemia: -meds: simvastatin, fish oil -doing Loose it App on phone and silver sneakers (04/2017) -wt 207 08/2016 --> 195 (04/2017)  Hx of Anemia, chronic: -dx and eval with prior PCP per report -takes iron daily  OA/chronic back pain: -sees ortho - Dr. Maxie Better and Dr. Nelva Bush -take tylenol most days -did PT for knees and now doing prep program - loves the prep program  COPD/Chronic Cough/Allergies: -seeing pulm -spiriva added 7/17 - no longer on list - she reports her pulmonologist stopped it and had been doing well until recent illness -meds: loratidine, pravacid, spiriva - flonase in the sping and fall, alb prn per review    ROS: See pertinent positives and negatives per HPI.  Past Medical History:  Diagnosis Date  . Alcoholism in remission ([place]) 08/27/2007  . Anemia   . Arthritis    OA  . COPD 12/17/2006  . Diverticulosis 03/27/2007  . GERD 05/01/2007  . HYPERGLYCEMIA 08/27/2007  .  HYPERLIPIDEMIA 12/17/2006  . HYPERTENSION 12/17/2006  . Mood disorder ([facility])    hx anxiety and depression  . Rectal polyp 03/27/2007   adenoma    Past Surgical History:  Procedure Laterality Date  . ABDOMINAL HYSTERECTOMY  1978   fibroma  . APPENDECTOMY  2002  . BREAST BIOPSY     negative  . BREAST EXCISIONAL BIOPSY Right    2003  . CARPAL TUNNEL RELEASE Left 2010 or 2011  . COLONOSCOPY W/ POLYPECTOMY  03/27/2007; n7/19/2012   2008: 4 mm adenoma, diverticulosis 2012: ileitis ? NSAID - likely, 2-3 mm cecal polyp LYMPHOID FOLLICLE, diverticulosis  . ESOPHAGOGASTRODUODENOSCOPY  01/26/2011   reflux esophagitis, colonscopy done also  . HAMMER TOE SURGERY  2008   left foot  . HERNIA REPAIR Right 1996?  . ORIF ANKLE FRACTURE Right 12/22/2014   Procedure: OPEN REDUCTION INTERNAL FIXATION (ORIF) ANKLE FRACTURE;  Surgeon: Susa Day, MD;  Location: [facility];  Service: Orthopedics;  Laterality: Right;  . SHOULDER OPEN ROTATOR CUFF REPAIR Left 03/06/2013   Procedure: LEFT ROTATOR CUFF REPAIR, SUBACROMIAL DECOMPRESSION, PATCH GRAFT, MANIPULATION UNDER ANESTHESIA;  Surgeon: Johnn Hai, MD;  Location: [facility];  Service: Orthopedics;  Laterality: Left;    Family History  Problem Relation Age of Onset  . Throat cancer Mother   . Heart attack Father 90  . Idiopathic pulmonary fibrosis Brother 49  . Cancer Brother        sarcoma  .  Heart disease Brother   . Colon cancer Neg Hx   . Esophageal cancer Neg Hx   . Stomach cancer Neg Hx     Social History   Social History  . Marital status: Married    Spouse name: N/A  . Number of children: 2  . Years of education: N/A   Occupational History  . retired    Social History Main Topics  . Smoking status: Former Smoker    Packs/day: 2.00    Years: 30.00    Types: Cigarettes    Quit date: 07/10/1984  . Smokeless tobacco: Never Used  . Alcohol use No     Comment: no alcoho since 1983  . Drug use: No  . Sexual activity: Not Asked   Other  Topics Concern  . None   Social History Narrative   Work or School: very active in [employer] Situation: lives with husband      Spiritual Beliefs: Christian      Lifestyle:tries to walk; diet ok - denies any alcohol or tobacco use in many many years           Current Outpatient Prescriptions:  .  acetaminophen ([employer] ARTHRITIS PAIN RELIEF) 650 MG CR tablet, Take 1,300 mg by mouth 2 (two) times daily., Disp: , Rfl:  .  albuterol (PROAIR HFA) 108 (90 Base) MCG/ACT inhaler, INHALE 2 PUFFS EVERY 4 HOURS AS NEEDED FOR COUGHING SPELLS, Disp: 8.5 Inhaler, Rfl: 2 .  aspirin 81 MG tablet, Take 81 mg by mouth daily., Disp: , Rfl:  .  azelastine (ASTELIN) 0.1 % nasal spray, Place 1 spray into both nostrils 2 (two) times daily. Use in each nostril as directed, Disp: , Rfl:  .  Coenzyme Q10 (COQ10 PO), Take by mouth daily., Disp: , Rfl:  .  ferrous sulfate 325 (65 FE) MG tablet, TAKE 1 TABLET BY MOUTH EVERY DAY, Disp: 90 tablet, Rfl: 3 .  hydrochlorothiazide (HYDRODIURIL) 25 MG tablet, Take 1 tablet (25 mg total) by mouth daily., Disp: 90 tablet, Rfl: 3 .  ipratropium (ATROVENT) 0.06 % nasal spray, Place 2 sprays into the nose 3 (three) times daily., Disp: , Rfl:  .  loratadine (CLARITIN) 10 MG tablet, Take 1 tablet (10 mg total) by mouth daily. (Patient taking differently: Take 10 mg by mouth as needed. ), Disp: 90 tablet, Rfl: 3 .  Multiple Vitamin (MULTIVITAMIN WITH MINERALS) TABS tablet, Take 1 tablet by mouth daily., Disp: , Rfl:  .  pantoprazole (PROTONIX) 40 MG tablet, Take 1 tablet (40 mg total) by mouth 2 (two) times daily., Disp: 60 tablet, Rfl: 1 .  potassium chloride SA (K-DUR,KLOR-CON) 20 MEQ tablet, TAKE 1 TABLET BY MOUTH DAILY, Disp: 90 tablet, Rfl: 1 .  simvastatin (ZOCOR) 20 MG tablet, Take 1 tablet (20 mg total) by mouth every morning., Disp: 90 tablet, Rfl: 3 .  Vitamin D, Cholecalciferol, 1000 units CAPS, Take 1 capsule by mouth daily., Disp: , Rfl:    EXAM:  Vitals:   04/16/17 0846  BP: 128/60  Pulse: 71  Temp: (!) 97.3 F (36.3 C)    Body mass index is 32.45 kg/m.  GENERAL: vitals reviewed and listed above, alert, oriented, appears well hydrated and in no acute distress  HEENT: atraumatic, conjunttiva clear, no obvious abnormalities on inspection of external nose and ears  NECK: no obvious masses on inspection  LUNGS: clear to auscultation bilaterally, no wheezes, rales or rhonchi, good air movement  CV:  HRRR, no peripheral edema  MS: moves all extremities without noticeable abnormality, Soft irregular rubbery mass in the left anterior arm just above the elbow  PSYCH: pleasant and cooperative, no obvious depression or anxiety  ASSESSMENT AND PLAN:  Discussed the following assessment and plan:  Essential hypertension - Plan: Basic metabolic panel -check BMP -Refills provided, may need to increase her potassium  Hyperlipidemia, unspecified hyperlipidemia type BMI 32.0-32.9,adult -Congratulated on her lifestyle changes and weight reduction encouraged her to continue  Arm mass, left -She plans to see her orthopedic surgeon, advised her to let us know if she needs a referral to a different surgeon for removal  -Patient advised to return or notify a doctor immediately if symptoms worsen or persist or new concerns arise.  Patient Instructions  BEFORE YOU LEAVE: -down 12 lbs since this spring! Keep up the healthy changes -flu shot -follow up: 3-4 months -lab  I am so glad that you are feeling better!     Colin Benton R., DO

## 2017-04-16 ENCOUNTER — Encounter: Payer: Self-pay | Admitting: Family Medicine

## 2017-04-16 ENCOUNTER — Ambulatory Visit (INDEPENDENT_AMBULATORY_CARE_PROVIDER_SITE_OTHER): Payer: Medicare HMO | Admitting: Family Medicine

## 2017-04-16 VITALS — BP 128/60 | HR 71 | Temp 97.3°F | Ht 65.0 in | Wt 195.0 lb

## 2017-04-16 DIAGNOSIS — E785 Hyperlipidemia, unspecified: Secondary | ICD-10-CM | POA: Diagnosis not present

## 2017-04-16 DIAGNOSIS — Z23 Encounter for immunization: Secondary | ICD-10-CM | POA: Diagnosis not present

## 2017-04-16 DIAGNOSIS — I10 Essential (primary) hypertension: Secondary | ICD-10-CM

## 2017-04-16 DIAGNOSIS — Z6832 Body mass index (BMI) 32.0-32.9, adult: Secondary | ICD-10-CM | POA: Diagnosis not present

## 2017-04-16 DIAGNOSIS — R2232 Localized swelling, mass and lump, left upper limb: Secondary | ICD-10-CM | POA: Diagnosis not present

## 2017-04-16 LAB — BASIC METABOLIC PANEL
BUN: 16 mg/dL (ref 6–23)
CALCIUM: 10.1 mg/dL (ref 8.4–10.5)
CO2: 32 mEq/L (ref 19–32)
CREATININE: 0.8 mg/dL (ref 0.40–1.20)
Chloride: 100 mEq/L (ref 96–112)
GFR: 73.31 mL/min (ref >60.00)
Glucose, Bld: 98 mg/dL (ref 70–99)
Potassium: 4 mEq/L (ref 3.5–5.1)
Sodium: 140 mEq/L (ref 135–145)

## 2017-04-16 MED ORDER — HYDROCHLOROTHIAZIDE 25 MG PO TABS: 25.0000 mg | ORAL_TABLET | Freq: Every day | ORAL | 3 refills | Status: DC

## 2017-04-16 MED ORDER — SIMVASTATIN 20 MG PO TABS: 20.0000 mg | ORAL_TABLET | Freq: Every morning | ORAL | 3 refills | Status: DC

## 2017-04-16 NOTE — Patient Instructions (Addendum)
BEFORE YOU LEAVE: -down 12 lbs since this spring! Keep up the healthy changes -flu shot -follow up: 3-4 months -lab  I am so glad that you are feeling better!

## 2017-04-16 NOTE — Addendum Note (Signed)
Addended by: Agnes Lawrence on: 04/16/2017 09:23 AM   Modules accepted: Orders

## 2017-04-24 DIAGNOSIS — R2232 Localized swelling, mass and lump, left upper limb: Secondary | ICD-10-CM | POA: Diagnosis not present

## 2017-05-03 ENCOUNTER — Other Ambulatory Visit: Payer: Self-pay | Admitting: Gastroenterology

## 2017-05-07 DIAGNOSIS — R2232 Localized swelling, mass and lump, left upper limb: Secondary | ICD-10-CM | POA: Diagnosis not present

## 2017-05-17 DIAGNOSIS — M25522 Pain in left elbow: Secondary | ICD-10-CM | POA: Diagnosis not present

## 2017-05-17 DIAGNOSIS — R2232 Localized swelling, mass and lump, left upper limb: Secondary | ICD-10-CM | POA: Diagnosis not present

## 2017-05-21 DIAGNOSIS — J300 Vasomotor rhinitis: Secondary | ICD-10-CM | POA: Diagnosis not present

## 2017-05-21 DIAGNOSIS — R05 Cough: Secondary | ICD-10-CM | POA: Diagnosis not present

## 2017-06-07 DIAGNOSIS — L812 Freckles: Secondary | ICD-10-CM | POA: Diagnosis not present

## 2017-06-07 DIAGNOSIS — L218 Other seborrheic dermatitis: Secondary | ICD-10-CM | POA: Diagnosis not present

## 2017-06-07 DIAGNOSIS — Z85820 Personal history of malignant melanoma of skin: Secondary | ICD-10-CM | POA: Diagnosis not present

## 2017-06-07 DIAGNOSIS — D1801 Hemangioma of skin and subcutaneous tissue: Secondary | ICD-10-CM | POA: Diagnosis not present

## 2017-06-07 DIAGNOSIS — L821 Other seborrheic keratosis: Secondary | ICD-10-CM | POA: Diagnosis not present

## 2017-06-10 ENCOUNTER — Other Ambulatory Visit: Payer: Self-pay | Admitting: Family Medicine

## 2017-06-11 DIAGNOSIS — R69 Illness, unspecified: Secondary | ICD-10-CM | POA: Diagnosis not present

## 2017-06-15 ENCOUNTER — Telehealth: Payer: Self-pay | Admitting: Family Medicine

## 2017-06-15 NOTE — Telephone Encounter (Signed)
Copied from [place] [id number]. Topic: Quick Communication - See Telephone Encounter >> Jun 15, 2017 10:38 AM Burnis Medin, NT wrote: CRM for notification. See Telephone encounter for: Pt is calling in to see if she can get a prescription for a Z pap. Pt has being having symptoms of a sore throat, cough and congestion since yesterday morning. Pt uses [name] [address].  06/15/17.

## 2017-06-16 DIAGNOSIS — J0140 Acute pansinusitis, unspecified: Secondary | ICD-10-CM | POA: Diagnosis not present

## 2017-06-16 DIAGNOSIS — J029 Acute pharyngitis, unspecified: Secondary | ICD-10-CM | POA: Diagnosis not present

## 2017-06-20 NOTE — Telephone Encounter (Signed)
Attempted to call patient back to triage for symptoms listed.  No answer.  Left message that if she is still having symptoms to please call to either be triaged or get an appointment.

## 2017-06-30 ENCOUNTER — Other Ambulatory Visit: Payer: Self-pay | Admitting: Family Medicine

## 2017-07-02 ENCOUNTER — Other Ambulatory Visit: Payer: Self-pay | Admitting: Gastroenterology

## 2017-07-04 ENCOUNTER — Other Ambulatory Visit: Payer: Self-pay | Admitting: Gastroenterology

## 2017-07-19 ENCOUNTER — Encounter: Payer: Self-pay | Admitting: Family Medicine

## 2017-07-28 ENCOUNTER — Other Ambulatory Visit: Payer: Self-pay | Admitting: Gastroenterology

## 2017-07-28 ENCOUNTER — Other Ambulatory Visit: Payer: Self-pay | Admitting: Family Medicine

## 2017-08-06 ENCOUNTER — Encounter: Payer: Self-pay | Admitting: Family Medicine

## 2017-08-06 ENCOUNTER — Ambulatory Visit (INDEPENDENT_AMBULATORY_CARE_PROVIDER_SITE_OTHER): Payer: Medicare HMO | Admitting: Family Medicine

## 2017-08-06 VITALS — BP 138/70 | HR 75 | Temp 97.3°F | Ht 65.0 in | Wt 193.0 lb

## 2017-08-06 DIAGNOSIS — J329 Chronic sinusitis, unspecified: Secondary | ICD-10-CM

## 2017-08-06 DIAGNOSIS — R42 Dizziness and giddiness: Secondary | ICD-10-CM | POA: Diagnosis not present

## 2017-08-06 MED ORDER — MECLIZINE HCL 25 MG PO TABS: 25.0000 mg | ORAL_TABLET | Freq: Three times a day (TID) | ORAL | 0 refills | Status: DC | PRN

## 2017-08-06 NOTE — Progress Notes (Signed)
HPI:  Acute visit for vertigo: -Started the last 2-3 days -Vertigo triggered by certain quick movements of the head -2016 and it was fixed with physical therapy -She had some exercises that the therapist gave her and she has been doing those, symptoms are little better today -Denies headaches, vision changes, weakness, numbness or changes in hearing  Sinus symptoms: -PND, occ cough, nasal congestion -On and off -Of allergies and asthma, sees specialist for this -No fever, shortness of breath, wheezing, body aches or sinus pain  ROS: See pertinent positives and negatives per HPI.  Past Medical History:  Diagnosis Date  . Alcoholism in remission ([place]) 08/27/2007  . Anemia   . Arthritis    OA  . COPD 12/17/2006  . Diverticulosis 03/27/2007  . GERD 05/01/2007  . HYPERGLYCEMIA 08/27/2007  . HYPERLIPIDEMIA 12/17/2006  . HYPERTENSION 12/17/2006  . Mood disorder ([facility])    hx anxiety and depression  . Rectal polyp 03/27/2007   adenoma    Past Surgical History:  Procedure Laterality Date  . ABDOMINAL HYSTERECTOMY  1978   fibroma  . APPENDECTOMY  2002  . BREAST BIOPSY     negative  . BREAST EXCISIONAL BIOPSY Right    2003  . CARPAL TUNNEL RELEASE Left 2010 or 2011  . COLONOSCOPY W/ POLYPECTOMY  03/27/2007; n7/19/2012   2008: 4 mm adenoma, diverticulosis 2012: ileitis ? NSAID - likely, 2-3 mm cecal polyp LYMPHOID FOLLICLE, diverticulosis  . ESOPHAGOGASTRODUODENOSCOPY  01/26/2011   reflux esophagitis, colonscopy done also  . HAMMER TOE SURGERY  2008   left foot  . HERNIA REPAIR Right 1996?  . ORIF ANKLE FRACTURE Right 12/22/2014   Procedure: OPEN REDUCTION INTERNAL FIXATION (ORIF) ANKLE FRACTURE;  Surgeon: Susa Day, MD;  Location: [facility];  Service: Orthopedics;  Laterality: Right;  . SHOULDER OPEN ROTATOR CUFF REPAIR Left 03/06/2013   Procedure: LEFT ROTATOR CUFF REPAIR, SUBACROMIAL DECOMPRESSION, PATCH GRAFT, MANIPULATION UNDER ANESTHESIA;  Surgeon: Johnn Hai, MD;  Location:  [facility];  Service: Orthopedics;  Laterality: Left;    Family History  Problem Relation Age of Onset  . Throat cancer Mother   . Heart attack Father 55  . Idiopathic pulmonary fibrosis Brother 22  . Cancer Brother        sarcoma  . Heart disease Brother   . Colon cancer Neg Hx   . Esophageal cancer Neg Hx   . Stomach cancer Neg Hx     Social History   Socioeconomic History  . Marital status: Married    Spouse name: None  . Number of children: 2  . Years of education: None  . Highest education level: None  Social Needs  . Financial resource strain: None  . Food insecurity - worry: None  . Food insecurity - inability: None  . Transportation needs - medical: None  . Transportation needs - non-medical: None  Occupational History  . Occupation: retired  Tobacco Use  . Smoking status: Former Smoker    Packs/day: 2.00    Years: 30.00    Pack years: 60.00    Types: Cigarettes    Last attempt to quit: 07/10/1984    Years since quitting: 33.0  . Smokeless tobacco: Never Used  Substance and Sexual Activity  . Alcohol use: No    Alcohol/week: 0.0 oz    Comment: no alcoho since 1983  . Drug use: No  . Sexual activity: None  Other Topics Concern  . None  Social History Narrative   Work or School: very  active in [place] and church      Home Situation: lives with husband      Spiritual Beliefs: Christian      Lifestyle:tries to walk; diet ok - denies any alcohol or tobacco use in many many years        Current Outpatient Medications:  .  acetaminophen ([employer] ARTHRITIS PAIN RELIEF) 650 MG CR tablet, Take 1,300 mg by mouth 2 (two) times daily., Disp: , Rfl:  .  albuterol (PROAIR HFA) 108 (90 Base) MCG/ACT inhaler, INHALE 2 PUFFS EVERY 4 HOURS AS NEEDED FOR COUGHING SPELLS, Disp: 8.5 Inhaler, Rfl: 2 .  aspirin 81 MG tablet, Take 81 mg by mouth daily., Disp: , Rfl:  .  azelastine (ASTELIN) 0.1 % nasal spray, Place 1 spray into both nostrils 2 (two) times daily. Use in each nostril  as directed, Disp: , Rfl:  .  Coenzyme Q10 (COQ10 PO), Take by mouth daily., Disp: , Rfl:  .  ferrous sulfate 325 (65 FE) MG tablet, TAKE 1 TABLET BY MOUTH EVERY DAY, Disp: 90 tablet, Rfl: 3 .  hydrochlorothiazide (HYDRODIURIL) 25 MG tablet, Take 1 tablet (25 mg total) by mouth daily., Disp: 90 tablet, Rfl: 3 .  ipratropium (ATROVENT) 0.06 % nasal spray, Place 2 sprays into the nose 3 (three) times daily., Disp: , Rfl:  .  loratadine (CLARITIN) 10 MG tablet, Take 1 tablet (10 mg total) by mouth daily. (Patient taking differently: Take 10 mg by mouth as needed. ), Disp: 90 tablet, Rfl: 3 .  Multiple Vitamin (MULTIVITAMIN WITH MINERALS) TABS tablet, Take 1 tablet by mouth daily., Disp: , Rfl:  .  pantoprazole (PROTONIX) 40 MG tablet, TAKE ONE TABLET BY MOUTH DAILY, Disp: 60 tablet, Rfl: 3 .  potassium chloride SA (K-DUR,KLOR-CON) 20 MEQ tablet, TAKE 1 TABLET BY MOUTH DAILY, Disp: 90 tablet, Rfl: 1 .  ranitidine (ZANTAC) 300 MG tablet, TAKE ONE TABLET BY MOUTH EVERY NIGHT AT BEDTIME, Disp: 30 tablet, Rfl: 2 .  simvastatin (ZOCOR) 20 MG tablet, Take 1 tablet (20 mg total) by mouth every morning., Disp: 90 tablet, Rfl: 3 .  Vitamin D, Cholecalciferol, 1000 units CAPS, Take 1 capsule by mouth daily., Disp: , Rfl:  .  meclizine (ANTIVERT) 25 MG tablet, Take 1 tablet (25 mg total) by mouth 3 (three) times daily as needed for dizziness., Disp: 30 tablet, Rfl: 0  EXAM:  Vitals:   08/06/17 1141  BP: 138/70  Pulse: 75  Temp: (!) 97.3 F (36.3 C)  SpO2: 97%    Body mass index is 32.12 kg/m.  GENERAL: vitals reviewed and listed above, alert, oriented, appears well hydrated and in no acute distress  HEENT: atraumatic, conjunttiva clear, no obvious abnormalities on inspection of external nose and ears, normal appearance of ear canals and TMs, clear nasal congestion, mild post oropharyngeal erythema with PND, no tonsillar edema or exudate, no sinus TTP  NECK: no obvious masses on inspection  LUNGS:  clear to auscultation bilaterally, no wheezes, rales or rhonchi, good air movement  CV: HRRR, no peripheral edema  MS: moves all extremities without noticeable abnormality  PSYCH: pleasant and cooperative, no obvious depression or anxiety, speech and thought processing grossly intact, cranial nerves II through XII grossly intact, finger to nose normal, Dix-Hallpike negative today  ASSESSMENT AND PLAN:  Discussed the following assessment and plan:  Vertigo - Plan: Ambulatory referral to Physical Therapy  Rhinosinusitis  -we discussed possible serious and likely etiologies for her symptoms, workup and treatment, treatment risks and  return precautions -after this discussion, Sheila Shields opted for: 1) for the vertigo, meclizine, no driving, home exercises as this seems to be correcting the issue, physical therapy referral in case problem persists 2) INS, follow up if worsens or persist -follow up advised as scheduled in a few weeks -of course, we advised Sheila Shields  to return or notify a doctor immediately if symptoms worsen or persist or new concerns arise.   Patient Instructions  Continue the home exercises.  I did place a referral to the physical therapist if these are not working.  Use the meclizine as needed per instructions.  Do not drive when you are having vertigo.     Sheila Kern, DO

## 2017-08-06 NOTE — Patient Instructions (Signed)
Continue the home exercises.  I did place a referral to the physical therapist if these are not working.  Use the meclizine as needed per instructions.  Do not drive when you are having vertigo.

## 2017-08-09 DIAGNOSIS — M25571 Pain in right ankle and joints of right foot: Secondary | ICD-10-CM | POA: Diagnosis not present

## 2017-08-17 ENCOUNTER — Ambulatory Visit: Payer: Medicare HMO | Admitting: Family Medicine

## 2017-08-20 NOTE — Progress Notes (Signed)
HPI:  Sheila Shields is a pleasant 83 y.o. here for follow up. Chronic medical problems summarized below were reviewed for changes and stability and were updated as needed below. These issues and their treatment remain stable for the most part.  Reports she is doing great.  The vertigo has completely resolved.  She has some osteoarthritis in her hands and various other areas and sees her orthopedic specialist for this.  She is taking Tylenol twice a day.  She wonders about other things that could help with this.  She also saw her orthopedic doctor recently for tarsal tunnel issues in the left ankle.  Reports this is resolved now.  She has a new complaint of poor sleep.  Feels her sleep patterns have changed as she is age.  She does not want to take medications for this.  She wonders about other treatment options.  He has difficulty falling asleep and maintaining sleep.  This happens a few nights per week.  She then sleeps well on other nights.  Denies CP, SOB, DOE, treatment intolerance or new symptoms. Due for labs, BMP, CBC, lipids and hemoglobin A1c  Vertigo: -See office visit notes 08/06/2017 -Presumed BPPV along with sinus issues  HTN: -meds: asa, losartan-hctz 100-12.5 in the past -now hctz alone due to pt preference and less need with wt reduction  GERD: -prevacid 15  HLD/Obesity/Hyperglycemia: -meds: simvastatin, fish oil -doing Loose it App on phone and silver sneakers (04/2017) -wt 207 2/18 --> 195 (10/18) --> 191 (2/19)  Hx of Anemia, chronic: -dx and eval with prior PCP per report -takes iron daily  OA/chronic back pain.R ankle injury: -sees ortho - Dr. Maxie Better and Dr. Nelva Bush -take tylenol most days  COPD/Chronic Cough/Allergies: -seeing pulm   ROS: See pertinent positives and negatives per HPI.  Past Medical History:  Diagnosis Date  . Alcoholism in remission ([place]) 08/27/2007  . Anemia   . Arthritis    OA  . COPD 12/17/2006  . Diverticulosis 03/27/2007  . GERD  05/01/2007  . HYPERGLYCEMIA 08/27/2007  . HYPERLIPIDEMIA 12/17/2006  . HYPERTENSION 12/17/2006  . Mood disorder (HCC)    hx anxiety and depression  . Rectal polyp 03/27/2007   adenoma    Past Surgical History:  Procedure Laterality Date  . ABDOMINAL HYSTERECTOMY  1978   fibroma  . APPENDECTOMY  2002  . BREAST BIOPSY     negative  . BREAST EXCISIONAL BIOPSY Right    2003  . CARPAL TUNNEL RELEASE Left 2010 or 2011  . COLONOSCOPY W/ POLYPECTOMY  03/27/2007; n7/19/2012   2008: 4 mm adenoma, diverticulosis 2012: ileitis ? NSAID - likely, 2-3 mm cecal polyp LYMPHOID FOLLICLE, diverticulosis  . ESOPHAGOGASTRODUODENOSCOPY  01/26/2011   reflux esophagitis, colonscopy done also  . HAMMER TOE SURGERY  2008   left foot  . HERNIA REPAIR Right 1996?  . ORIF ANKLE FRACTURE Right 12/22/2014   Procedure: OPEN REDUCTION INTERNAL FIXATION (ORIF) ANKLE FRACTURE;  Surgeon: Susa Day, MD;  Location: [facility];  Service: Orthopedics;  Laterality: Right;  . SHOULDER OPEN ROTATOR CUFF REPAIR Left 03/06/2013   Procedure: LEFT ROTATOR CUFF REPAIR, SUBACROMIAL DECOMPRESSION, PATCH GRAFT, MANIPULATION UNDER ANESTHESIA;  Surgeon: Johnn Hai, MD;  Location: [facility];  Service: Orthopedics;  Laterality: Left;    Family History  Problem Relation Age of Onset  . Throat cancer Mother   . Heart attack Father 31  . Idiopathic pulmonary fibrosis Brother 76  . Cancer Brother        sarcoma  .  Heart disease Brother   . Colon cancer Neg Hx   . Esophageal cancer Neg Hx   . Stomach cancer Neg Hx     Social History   Socioeconomic History  . Marital status: Married    Spouse name: None  . Number of children: 2  . Years of education: None  . Highest education level: None  Social Needs  . Financial resource strain: None  . Food insecurity - worry: None  . Food insecurity - inability: None  . Transportation needs - medical: None  . Transportation needs - non-medical: None  Occupational History  . Occupation:  retired  Tobacco Use  . Smoking status: Former Smoker    Packs/day: 2.00    Years: 30.00    Pack years: 60.00    Types: Cigarettes    Last attempt to quit: 07/10/1984    Years since quitting: 33.1  . Smokeless tobacco: Never Used  Substance and Sexual Activity  . Alcohol use: No    Alcohol/week: 0.0 oz    Comment: no alcoho since 1983  . Drug use: No  . Sexual activity: None  Other Topics Concern  . None  Social History Narrative   Work or School: very active in [place] Situation: lives with husband      Spiritual Beliefs: Christian      Lifestyle:tries to walk; diet ok - denies any alcohol or tobacco use in many many years        Current Outpatient Medications:  .  acetaminophen ([employer] ARTHRITIS PAIN RELIEF) 650 MG CR tablet, Take 1,300 mg by mouth 2 (two) times daily., Disp: , Rfl:  .  albuterol (PROAIR HFA) 108 (90 Base) MCG/ACT inhaler, INHALE 2 PUFFS EVERY 4 HOURS AS NEEDED FOR COUGHING SPELLS, Disp: 8.5 Inhaler, Rfl: 2 .  aspirin 81 MG tablet, Take 81 mg by mouth daily., Disp: , Rfl:  .  azelastine (ASTELIN) 0.1 % nasal spray, Place 1 spray into both nostrils 2 (two) times daily. Use in each nostril as directed, Disp: , Rfl:  .  Coenzyme Q10 (COQ10 PO), Take by mouth daily., Disp: , Rfl:  .  ferrous sulfate 325 (65 FE) MG tablet, TAKE 1 TABLET BY MOUTH EVERY DAY, Disp: 90 tablet, Rfl: 3 .  hydrochlorothiazide (HYDRODIURIL) 25 MG tablet, Take 1 tablet (25 mg total) by mouth daily., Disp: 90 tablet, Rfl: 3 .  ipratropium (ATROVENT) 0.06 % nasal spray, Place 2 sprays into the nose 3 (three) times daily., Disp: , Rfl:  .  loratadine (CLARITIN) 10 MG tablet, Take 1 tablet (10 mg total) by mouth daily. (Patient taking differently: Take 10 mg by mouth as needed. ), Disp: 90 tablet, Rfl: 3 .  meclizine (ANTIVERT) 25 MG tablet, Take 1 tablet (25 mg total) by mouth 3 (three) times daily as needed for dizziness., Disp: 30 tablet, Rfl: 0 .  Multiple Vitamin  (MULTIVITAMIN WITH MINERALS) TABS tablet, Take 1 tablet by mouth daily., Disp: , Rfl:  .  pantoprazole (PROTONIX) 40 MG tablet, TAKE ONE TABLET BY MOUTH DAILY, Disp: 60 tablet, Rfl: 3 .  potassium chloride SA (K-DUR,KLOR-CON) 20 MEQ tablet, TAKE 1 TABLET BY MOUTH DAILY, Disp: 90 tablet, Rfl: 1 .  ranitidine (ZANTAC) 300 MG tablet, TAKE ONE TABLET BY MOUTH EVERY NIGHT AT BEDTIME, Disp: 30 tablet, Rfl: 2 .  simvastatin (ZOCOR) 20 MG tablet, Take 1 tablet (20 mg total) by mouth every morning., Disp: 90 tablet, Rfl: 3 .  Vitamin D, Cholecalciferol, 1000 units CAPS, Take 1 capsule by mouth daily., Disp: , Rfl:   EXAM:  Vitals:   08/21/17 0942  BP: 130/68  Pulse: 77  Temp: 97.6 F (36.4 C)    Body mass index is 31.88 kg/m.  GENERAL: vitals reviewed and listed above, alert, oriented, appears well hydrated and in no acute distress  HEENT: atraumatic, conjunttiva clear, no obvious abnormalities on inspection of external nose and ears  NECK: no obvious masses on inspection  LUNGS: clear to auscultation bilaterally, no wheezes, rales or rhonchi, good air movement  CV: HRRR, no peripheral edema  MS: moves all extremities without noticeable abnormality  PSYCH: pleasant and cooperative, no obvious depression or anxiety  ASSESSMENT AND PLAN:  Discussed the following assessment and plan:  Essential hypertension - Plan: Basic metabolic panel, CBC -She would like to continue off of the arm, blood pressure relatively adequately controlled, she plans to continue lifestyle changes  Hyperlipidemia, unspecified hyperlipidemia type - Plan: Lipid panel -Continue lifestyle changes and current treatment  BMI 31.0-31.9,adult -I am so proud of her lifestyle changes in the weight reduction, she plans to continue this  Insomnia, unspecified type -Treatment options and advised CBT, number provided to contact you about behavioral health  Osteoarthritis of multiple joints, unspecified osteoarthritis  type -Sees her orthopedic specialist for this, we discussed various over-the-counter topical treatments and heat as options for pain as well.  We also discussed max dosing of Tylenol.  Vertigo -Resolved  Hyperglycemia - Plan: Hemoglobin A1c -Continue lifestyle changes  Sees pulmonology for management of COPD  -Patient advised to return or notify a doctor immediately if symptoms worsen or persist or new concerns arise.  Patient Instructions  BEFORE YOU LEAVE: -Labs -follow up: Follow-up with Dr. Maudie Mercury in about 3-4 months.  Keep annual wellness visit as scheduled with Manuela Schwartz.  Can try topical sports creams with capsaicin or menthol for the arthritic pain in the hands.  Do not exceed max tylenol dosing.  Heat can also help.  Please call the number provided to schedule cognitive behavioral therapy for sleep.  We have ordered labs or studies at this visit. It can take up to 1-2 weeks for results and processing. IF results require follow up or explanation, we will call you with instructions. Clinically stable results will be released to your [facility]. If you have not heard from [place] or cannot find your results in [facility] in 2 weeks please contact our office at [phone].  If you are not yet signed up for [facility], please consider signing up.  We recommend the following healthy lifestyle for LIFE: 1) Small portions. But, make sure to get regular (at least 3 per day), healthy meals and small healthy snacks if needed.  2) Eat a healthy clean diet.   TRY TO EAT: -at least 5-7 servings of low sugar, colorful, and nutrient rich vegetables per day (not corn, potatoes or bananas.) -berries are the best choice if you wish to eat fruit (only eat small amounts if trying to reduce weight)  -lean meets (fish, white meat of chicken or [place]) -vegan proteins for some meals - beans or tofu, whole grains, nuts and seeds -Replace bad fats with good fats - good fats include: fish, nuts and seeds, canola oil,  olive oil -small amounts of low fat or non fat dairy -small amounts of100 % whole grains - check the lables -drink plenty of water  AVOID: -SUGAR, sweets, anything with added sugar, corn syrup or sweeteners - must  read labels as even foods advertised as "healthy" often are loaded with sugar -if you must have a sweetener, small amounts of stevia may be best -sweetened beverages and artificially sweetened beverages -simple starches (rice, bread, potatoes, pasta, chips, etc - small amounts of 100% whole grains are ok) -red meat, pork, butter -fried foods, fast food, processed food, excessive dairy, eggs and coconut.  3)Get at least 150 minutes of sweaty aerobic exercise per week.  4)Reduce stress - consider counseling, meditation and relaxation to balance other aspects of your life.     Lucretia Kern, DO

## 2017-08-21 ENCOUNTER — Ambulatory Visit (INDEPENDENT_AMBULATORY_CARE_PROVIDER_SITE_OTHER): Payer: Medicare HMO | Admitting: Family Medicine

## 2017-08-21 ENCOUNTER — Encounter: Payer: Self-pay | Admitting: Family Medicine

## 2017-08-21 VITALS — BP 130/68 | HR 77 | Temp 97.6°F | Ht 65.0 in | Wt 191.6 lb

## 2017-08-21 DIAGNOSIS — I10 Essential (primary) hypertension: Secondary | ICD-10-CM

## 2017-08-21 DIAGNOSIS — R739 Hyperglycemia, unspecified: Secondary | ICD-10-CM | POA: Diagnosis not present

## 2017-08-21 DIAGNOSIS — Z6831 Body mass index (BMI) 31.0-31.9, adult: Secondary | ICD-10-CM

## 2017-08-21 DIAGNOSIS — E785 Hyperlipidemia, unspecified: Secondary | ICD-10-CM

## 2017-08-21 DIAGNOSIS — M159 Polyosteoarthritis, unspecified: Secondary | ICD-10-CM | POA: Diagnosis not present

## 2017-08-21 DIAGNOSIS — J449 Chronic obstructive pulmonary disease, unspecified: Secondary | ICD-10-CM

## 2017-08-21 DIAGNOSIS — R42 Dizziness and giddiness: Secondary | ICD-10-CM | POA: Diagnosis not present

## 2017-08-21 DIAGNOSIS — G4700 Insomnia, unspecified: Secondary | ICD-10-CM

## 2017-08-21 LAB — CBC
HEMATOCRIT: 43.1 % (ref 36.0–46.0)
HEMOGLOBIN: 14.6 g/dL (ref 12.0–15.0)
MCHC: 33.7 g/dL (ref 30.0–36.0)
MCV: 94 fl (ref 78.0–100.0)
PLATELETS: 286 10*3/uL (ref 150.0–400.0)
RBC: 4.59 Mil/uL (ref 3.87–5.11)
RDW: 13.3 % (ref 11.5–15.5)
WBC: 5.9 10*3/uL (ref 4.0–10.5)

## 2017-08-21 LAB — BASIC METABOLIC PANEL
BUN: 16 mg/dL (ref 6–23)
CHLORIDE: 101 meq/L (ref 96–112)
CO2: 35 mEq/L — ABNORMAL HIGH (ref 19–32)
Calcium: 10 mg/dL (ref 8.4–10.5)
Creatinine, Ser: 0.85 mg/dL (ref 0.40–1.20)
GFR: 68.3 mL/min (ref >60.00)
Glucose, Bld: 96 mg/dL (ref 70–99)
POTASSIUM: 3.8 meq/L (ref 3.5–5.1)
Sodium: 144 mEq/L (ref 135–145)

## 2017-08-21 LAB — LIPID PANEL
CHOL/HDL RATIO: 4
Cholesterol: 171 mg/dL (ref 0–200)
HDL: 45.1 mg/dL (ref >39.00)
NONHDL: 126.33
Triglycerides: 207 mg/dL — ABNORMAL HIGH (ref 0.0–149.0)
VLDL: 41.4 mg/dL — AB (ref 0.0–40.0)

## 2017-08-21 LAB — HEMOGLOBIN A1C: HEMOGLOBIN A1C: 5.9 % (ref 4.6–6.5)

## 2017-08-21 LAB — LDL CHOLESTEROL, DIRECT: LDL DIRECT: 86 mg/dL

## 2017-08-21 NOTE — Patient Instructions (Addendum)
BEFORE YOU LEAVE: -Labs -follow up: Follow-up with Dr. Maudie Mercury in about 3-4 months.  Keep annual wellness visit as scheduled with Manuela Schwartz.  Can try topical sports creams with capsaicin or menthol for the arthritic pain in the hands.  Do not exceed max tylenol dosing.  Heat can also help.  Please call the number provided to schedule cognitive behavioral therapy for sleep.  We have ordered labs or studies at this visit. It can take up to 1-2 weeks for results and processing. IF results require follow up or explanation, we will call you with instructions. Clinically stable results will be released to your [facility]. If you have not heard from [place] or cannot find your results in [facility] in 2 weeks please contact our office at [phone].  If you are not yet signed up for [facility], please consider signing up.  We recommend the following healthy lifestyle for LIFE: 1) Small portions. But, make sure to get regular (at least 3 per day), healthy meals and small healthy snacks if needed.  2) Eat a healthy clean diet.   TRY TO EAT: -at least 5-7 servings of low sugar, colorful, and nutrient rich vegetables per day (not corn, potatoes or bananas.) -berries are the best choice if you wish to eat fruit (only eat small amounts if trying to reduce weight)  -lean meets (fish, white meat of chicken or [place]) -vegan proteins for some meals - beans or tofu, whole grains, nuts and seeds -Replace bad fats with good fats - good fats include: fish, nuts and seeds, canola oil, olive oil -small amounts of low fat or non fat dairy -small amounts of100 % whole grains - check the lables -drink plenty of water  AVOID: -SUGAR, sweets, anything with added sugar, corn syrup or sweeteners - must read labels as even foods advertised as "healthy" often are loaded with sugar -if you must have a sweetener, small amounts of stevia may be best -sweetened beverages and artificially sweetened beverages -simple starches (rice, bread,  potatoes, pasta, chips, etc - small amounts of 100% whole grains are ok) -red meat, pork, butter -fried foods, fast food, processed food, excessive dairy, eggs and coconut.  3)Get at least 150 minutes of sweaty aerobic exercise per week.  4)Reduce stress - consider counseling, meditation and relaxation to balance other aspects of your life.  9 9

## 2017-08-23 DIAGNOSIS — R69 Illness, unspecified: Secondary | ICD-10-CM | POA: Diagnosis not present

## 2017-09-11 ENCOUNTER — Other Ambulatory Visit: Payer: Self-pay | Admitting: Family Medicine

## 2017-09-11 DIAGNOSIS — Z1231 Encounter for screening mammogram for malignant neoplasm of breast: Secondary | ICD-10-CM

## 2017-09-26 ENCOUNTER — Encounter: Payer: Self-pay | Admitting: Family Medicine

## 2017-09-26 DIAGNOSIS — M79642 Pain in left hand: Principal | ICD-10-CM

## 2017-09-26 DIAGNOSIS — M79641 Pain in right hand: Secondary | ICD-10-CM

## 2017-09-27 ENCOUNTER — Ambulatory Visit: Payer: Medicare HMO | Admitting: Psychology

## 2017-09-27 DIAGNOSIS — F4320 Adjustment disorder, unspecified: Secondary | ICD-10-CM

## 2017-09-27 DIAGNOSIS — R69 Illness, unspecified: Secondary | ICD-10-CM | POA: Diagnosis not present

## 2017-10-09 DIAGNOSIS — H10413 Chronic giant papillary conjunctivitis, bilateral: Secondary | ICD-10-CM | POA: Diagnosis not present

## 2017-10-09 DIAGNOSIS — H43813 Vitreous degeneration, bilateral: Secondary | ICD-10-CM | POA: Diagnosis not present

## 2017-10-09 DIAGNOSIS — Z961 Presence of intraocular lens: Secondary | ICD-10-CM | POA: Diagnosis not present

## 2017-10-09 DIAGNOSIS — H04123 Dry eye syndrome of bilateral lacrimal glands: Secondary | ICD-10-CM | POA: Diagnosis not present

## 2017-10-11 ENCOUNTER — Ambulatory Visit: Payer: Medicare HMO | Admitting: Psychology

## 2017-10-15 ENCOUNTER — Encounter: Payer: Self-pay | Admitting: Family Medicine

## 2017-10-15 ENCOUNTER — Ambulatory Visit (INDEPENDENT_AMBULATORY_CARE_PROVIDER_SITE_OTHER): Payer: Medicare HMO | Admitting: Family Medicine

## 2017-10-15 VITALS — BP 123/68 | HR 81 | Temp 97.9°F | Resp 16 | Ht 65.0 in | Wt 194.0 lb

## 2017-10-15 DIAGNOSIS — J441 Chronic obstructive pulmonary disease with (acute) exacerbation: Secondary | ICD-10-CM

## 2017-10-15 DIAGNOSIS — J988 Other specified respiratory disorders: Secondary | ICD-10-CM | POA: Diagnosis not present

## 2017-10-15 MED ORDER — PREDNISONE 20 MG PO TABS: 40.0000 mg | ORAL_TABLET | Freq: Every day | ORAL | 0 refills | Status: AC

## 2017-10-15 MED ORDER — DOXYCYCLINE HYCLATE 100 MG PO TABS: 100.0000 mg | ORAL_TABLET | Freq: Two times a day (BID) | ORAL | 0 refills | Status: AC

## 2017-10-15 NOTE — Patient Instructions (Addendum)
  Sheila Shields I have seen you today for an acute visit.  A few things to remember from today's visit:   Chronic obstructive pulmonary disease with acute exacerbation ([place]) - Plan: predniSONE (DELTASONE) 20 MG tablet  Respiratory tract infection - Plan: doxycycline (VIBRA-TABS) 100 MG tablet   Medications prescribed today are intended for short period of time and will not be refill upon request, a follow up appointment might be necessary to discuss continuation of of treatment if appropriate.   Albuterol inh 2 puff every 4-6  hours for a week then as needed for wheezing or shortness of breath.   Take prednisone and doxycycline with food. Continue your medications for GERD.  In general please monitor for signs of worsening symptoms and seek immediate medical attention if any concerning.    I hope you get better soon!

## 2017-10-15 NOTE — Progress Notes (Signed)
ACUTE VISIT  HPI:  Chief Complaint  Patient presents with  . Cough    started 2 weeks ago    Ms.Sheila Shields is a 83 y.o.female here today with her husband complaining of "at least" 2 weeks of respiratory symptoms.  Productive cough with sputum, denies hemoptysis.  Cough  This is a new problem. The current episode started 1 to 4 weeks ago. The problem has been unchanged. The problem occurs every few minutes. The cough is productive of sputum. Associated symptoms include chills, postnasal drip, rhinorrhea and wheezing. Pertinent negatives include no chest pain, ear pain, fever, headaches, heartburn, hemoptysis, myalgias, nasal congestion, rash, sore throat, shortness of breath, sweats or weight loss. The symptoms are aggravated by exercise. Risk factors for lung disease include smoking/tobacco exposure. She has tried OTC cough suppressant and a beta-agonist inhaler for the symptoms. The treatment provided mild relief. Her past medical history is significant for COPD and environmental allergies.    No Hx of recent travel. No sick contact. No known insect bite.  Hx of allergies: Allergic rhinitis on Astelin nasal spray and Loratadine prn.  Symptoms exacerbated by exertion and alleviated by rest and Albuterol inh.  Hx of COPD. Former smoker.  OTC medications for this problem: Robitussin DM  Symptoms otherwise stable.  Hx of GERD, she is on Protonix and Ranitidine.    Review of Systems  Constitutional: Positive for chills and fatigue. Negative for activity change, appetite change, fever and weight loss.  HENT: Positive for postnasal drip and rhinorrhea. Negative for congestion, ear pain, mouth sores, sinus pressure, sore throat and voice change.   Respiratory: Positive for cough and wheezing. Negative for hemoptysis and shortness of breath.   Cardiovascular: Negative for chest pain, palpitations and leg swelling.  Gastrointestinal: Negative for abdominal pain,  diarrhea, heartburn, nausea and vomiting.  Musculoskeletal: Negative for gait problem and myalgias.  Skin: Negative for rash.  Allergic/Immunologic: Positive for environmental allergies.  Neurological: Negative for syncope, weakness and headaches.  Hematological: Negative for adenopathy. Does not bruise/bleed easily.      Current Outpatient Medications on File Prior to Visit  Medication Sig Dispense Refill  . acetaminophen ([employer]S ARTHRITIS PAIN RELIEF) 650 MG CR tablet Take 1,300 mg by mouth 2 (two) times daily.    Marland Kitchen albuterol (PROAIR HFA) 108 (90 Base) MCG/ACT inhaler INHALE 2 PUFFS EVERY 4 HOURS AS NEEDED FOR COUGHING SPELLS 8.5 Inhaler 2  . aspirin 81 MG tablet Take 81 mg by mouth daily.    Marland Kitchen azelastine (ASTELIN) 0.1 % nasal spray Place 1 spray into both nostrils 2 (two) times daily. Use in each nostril as directed    . azelastine (ASTELIN) 0.1 % nasal spray azelastine  Spray 1 x 2 times daily    . Coenzyme Q10 (COQ10 PO) Take by mouth daily.    . ferrous sulfate 325 (65 FE) MG tablet TAKE 1 TABLET BY MOUTH EVERY DAY 90 tablet 3  . hydrochlorothiazide (HYDRODIURIL) 25 MG tablet Take 1 tablet (25 mg total) by mouth daily. 90 tablet 3  . ipratropium (ATROVENT) 0.06 % nasal spray Place 2 sprays into the nose 3 (three) times daily.    Marland Kitchen loratadine (CLARITIN) 10 MG tablet Take 1 tablet (10 mg total) by mouth daily. (Patient taking differently: Take 10 mg by mouth as needed. ) 90 tablet 3  . meclizine (ANTIVERT) 25 MG tablet Take 1 tablet (25 mg total) by mouth 3 (three) times daily as needed for dizziness.  30 tablet 0  . Multiple Vitamin (MULTIVITAMIN WITH MINERALS) TABS tablet Take 1 tablet by mouth daily.    . pantoprazole (PROTONIX) 40 MG tablet TAKE ONE TABLET BY MOUTH DAILY 60 tablet 3  . potassium chloride SA (K-DUR,KLOR-CON) 20 MEQ tablet TAKE 1 TABLET BY MOUTH DAILY 90 tablet 1  . ranitidine (ZANTAC) 300 MG tablet TAKE ONE TABLET BY MOUTH EVERY NIGHT AT BEDTIME 30 tablet 2  .  simvastatin (ZOCOR) 20 MG tablet Take 1 tablet (20 mg total) by mouth every morning. 90 tablet 3  . Vitamin D, Cholecalciferol, 1000 units CAPS Take 1 capsule by mouth daily.     No current facility-administered medications on file prior to visit.      Past Medical History:  Diagnosis Date  . Alcoholism in remission ([place]) 08/27/2007  . Anemia   . Arthritis    OA  . COPD 12/17/2006  . Diverticulosis 03/27/2007  . GERD 05/01/2007  . HYPERGLYCEMIA 08/27/2007  . HYPERLIPIDEMIA 12/17/2006  . HYPERTENSION 12/17/2006  . Mood disorder (HCC)    hx anxiety and depression  . Rectal polyp 03/27/2007   adenoma   Allergies  Allergen Reactions  . Alcohol   . Codeine     Patient prefers not to take this due to history of alcoholism  . Dilantin  [Phenytoin Sodium Extended]   . Dilaudid [Hydromorphone Hcl] Other (See Comments)    Pt had hypotension after dilaudid 1mg  IV while in the OR, required temporary pressor intervention.  . Diclofenac Sodium Rash    Social History   Socioeconomic History  . Marital status: Married    Spouse name: Not on file  . Number of children: 2  . Years of education: Not on file  . Highest education level: Not on file  Occupational History  . Occupation: retired  Scientific laboratory technician  . Financial resource strain: Not on file  . Food insecurity:    Worry: Not on file    Inability: Not on file  . Transportation needs:    Medical: Not on file    Non-medical: Not on file  Tobacco Use  . Smoking status: Former Smoker    Packs/day: 2.00    Years: 30.00    Pack years: 60.00    Types: Cigarettes    Last attempt to quit: 07/10/1984    Years since quitting: 33.2  . Smokeless tobacco: Never Used  Substance and Sexual Activity  . Alcohol use: No    Alcohol/week: 0.0 oz    Comment: no alcoho since 1983  . Drug use: No  . Sexual activity: Not on file  Lifestyle  . Physical activity:    Days per week: Not on file    Minutes per session: Not on file  . Stress: Not on  file  Relationships  . Social connections:    Talks on phone: Not on file    Gets together: Not on file    Attends religious service: Not on file    Active member of club or organization: Not on file    Attends meetings of clubs or organizations: Not on file    Relationship status: Not on file  Other Topics Concern  . Not on file  Social History Narrative   Work or School: very active in [place] Situation: lives with husband      Spiritual Beliefs: Christian      Lifestyle:tries to walk; diet ok - denies any alcohol or tobacco use  in many many years       Vitals:   10/15/17 1543  BP: 123/68  Pulse: 81  Resp: 16  Temp: 97.9 F (36.6 C)  SpO2: 96%   Body mass index is 32.28 kg/m.   Physical Exam  Nursing note and vitals reviewed. Constitutional: She is oriented to person, place, and time. She appears well-developed. She does not appear ill. No distress.  HENT:  Head: Normocephalic and atraumatic.  Mouth/Throat: Oropharynx is clear and moist and mucous membranes are normal.  Post nasal drainage.  Eyes: Conjunctivae are normal.  Neck: No muscular tenderness present. No edema and no erythema present.  Cardiovascular: Normal rate and regular rhythm.  Murmur (SEM I/VI RUSB and LUSB) heard. Respiratory: Effort normal. No stridor. No respiratory distress. She has wheezes. She has rhonchi (mild). She has no rales.  Lymphadenopathy:       Head (right side): No submandibular adenopathy present.       Head (left side): No submandibular adenopathy present.    She has no cervical adenopathy.  Neurological: She is alert and oriented to person, place, and time. She has normal strength. Gait normal.  Skin: Skin is warm. No rash noted. No erythema.  Psychiatric: She has a normal mood and affect.  Well groomed, good eye contact.      ASSESSMENT AND PLAN:   Ms. Sheila Shields was seen today for cough.  Diagnoses and all orders for this visit:  Chronic obstructive  pulmonary disease with acute exacerbation ([place])  Wheezing improved with DuoNeb treatment. No rales, still a few rhonchi, prolonged expiration but no wheezing. Side effects of Prednisone discussed,recommend taking with food. Albuterol inh 2 puff every 4 hours for a week then as needed for wheezing or shortness of breath.  Instructed about warning signs. F/U with PCP in a week,before if needed.  -     predniSONE (DELTASONE) 20 MG tablet; Take 2 tablets (40 mg total) by mouth daily with breakfast for 5 days.  Respiratory tract infection  Explained that symptoms could be residual from recent viral infection but given her Hx of COPD + persistent symptoms, abx treated recommended. Side effects of abx discussed. I do not think imaging or blood work in needed today. F/U in 1 week.  -     doxycycline (VIBRA-TABS) 100 MG tablet; Take 1 tablet (100 mg total) by mouth 2 (two) times daily for 7 days.     -Ms. Sheila Shields advised to seek attention immediately if symptoms worsen.    Betty G. Martinique, MD  [facility]. [place] office.

## 2017-10-22 ENCOUNTER — Encounter: Payer: Self-pay | Admitting: Family Medicine

## 2017-10-25 ENCOUNTER — Ambulatory Visit: Payer: Medicare HMO | Admitting: Psychology

## 2017-10-26 ENCOUNTER — Other Ambulatory Visit: Payer: Self-pay | Admitting: Gastroenterology

## 2017-10-26 ENCOUNTER — Ambulatory Visit
Admission: RE | Admit: 2017-10-26 | Discharge: 2017-10-26 | Disposition: A | Discharge status: Home / Self Care | Payer: Medicare HMO | Source: Ambulatory Visit | Attending: Family Medicine | Admitting: Family Medicine

## 2017-10-26 DIAGNOSIS — Z1231 Encounter for screening mammogram for malignant neoplasm of breast: Secondary | ICD-10-CM | POA: Diagnosis not present

## 2017-10-29 DIAGNOSIS — R69 Illness, unspecified: Secondary | ICD-10-CM | POA: Diagnosis not present

## 2017-11-09 ENCOUNTER — Encounter: Payer: Self-pay | Admitting: Emergency Medicine

## 2017-11-09 ENCOUNTER — Ambulatory Visit: Payer: Medicare HMO | Admitting: Emergency Medicine

## 2017-11-09 DIAGNOSIS — J301 Allergic rhinitis due to pollen: Secondary | ICD-10-CM | POA: Diagnosis not present

## 2017-11-09 DIAGNOSIS — J309 Allergic rhinitis, unspecified: Secondary | ICD-10-CM | POA: Insufficient documentation

## 2017-11-09 DIAGNOSIS — K219 Gastro-esophageal reflux disease without esophagitis: Secondary | ICD-10-CM

## 2017-11-09 DIAGNOSIS — R053 Chronic cough: Secondary | ICD-10-CM

## 2017-11-09 DIAGNOSIS — R05 Cough: Secondary | ICD-10-CM

## 2017-11-09 DIAGNOSIS — J441 Chronic obstructive pulmonary disease with (acute) exacerbation: Secondary | ICD-10-CM | POA: Diagnosis not present

## 2017-11-09 NOTE — Assessment & Plan Note (Signed)
Continue protonix and zantac as she is doing, follow for breakthrough sx.

## 2017-11-09 NOTE — Assessment & Plan Note (Signed)
Contributions from GERD and rhinitis. Will work to treat both.

## 2017-11-09 NOTE — Patient Instructions (Signed)
Please continue your Albuterol 2 puffs as neededd for shortness of breath, wheeze, chest tightness. Keep track of how often you use this. If the frequency increases then we may decide to retry an every day scheduled inhaler.  Continue your protonix and zantac as you are taking them  Restart flonase nasal spray 2 sprays each side every day Follow with Dr Lamonte Sakai in 6 months or sooner if you have any problems

## 2017-11-09 NOTE — Assessment & Plan Note (Signed)
Fairly stable, although she was treated for an Ae last month - sounds like this was mainly cough. She uses albuterol about every other day - she may needs to go back on a scheduled BD at some point.

## 2017-11-09 NOTE — Assessment & Plan Note (Signed)
Taking loratadine, but not astelin or atrovent NS Add back flonase, hold off on the other sprays due to cost.

## 2017-11-09 NOTE — Progress Notes (Signed)
Subjective:    Patient ID: Sheila Shields, female    DOB: Apr 22, 1935, 83 y.o.   MRN: [id number]  COPD  She complains of cough and shortness of breath. There is no wheezing. Pertinent negatives include no ear pain, fever, headaches, myalgias, postnasal drip, rhinorrhea, sneezing, sore throat or trouble swallowing. Her past medical history is significant for COPD.   83 year old former smoker (60 pack years) with a history of hypertension, hyperlipidemia, GERD and COPD that was dx by PFT in 10/2013 > mild AFL. She is referred for evaluation of chronic and persistent cough.   Pulmonary function testing from 10/09/13 in full which showed mild obstruction and an FEV1 of 86% of predicted.   ROV 01/05/17 -- [name] is 75 with a history of former tobacco use. She has mild COPD based on mild obstruction on spirometry. She also has an irritable upper airway with chronic cough in the setting of GERD and chronic rhinitis. She has been well, no flares of her cough. Then 3 weeks ago had a lot of PND, possible URI sx, a lot of cough. She was treated with pred and then doxycycline, will finish it tonight. She is doing better. She is on loratadine, chlorpheniramine bid. She has SABA, uses it rarely unless she flares.   Acute OV 11/09/17 --acute visit for patient with a history of mild COPD based on spirometry, irritable upper airway with chronic cough.  She has GERD and chronic rhinitis as well.  She presents today reporting that she has been experiencing off and on cough, also some dyspnea. Both can happen w exertion. She can wake from sleep w cough. She is still on PPI oin the am, zantac at night. She has stable nasal congestion. She is on loratadine, has not used astelin, atrovent NS. She uses flonase prn. She uses albuterol about 3-4x a week. She was treated for a possible AE a month ago that manifested itself as cough, with pred + an abx.   She knows now that she has been exposed to some water damaged tile, mold -  now being repaired.   Review of Systems  Constitutional: Negative for fever and unexpected weight change.  HENT: Negative for congestion, dental problem, ear pain, nosebleeds, postnasal drip, rhinorrhea, sinus pressure, sneezing, sore throat and trouble swallowing.   Eyes: Negative for redness and itching.  Respiratory: Positive for cough and shortness of breath. Negative for chest tightness and wheezing.   Cardiovascular: Negative for palpitations and leg swelling.  Gastrointestinal: Negative for nausea and vomiting.  Genitourinary: Negative for dysuria.  Musculoskeletal: Negative for arthralgias, joint swelling and myalgias.  Skin: Negative for rash.  Neurological: Negative for headaches.  Hematological: Does not bruise/bleed easily.  Psychiatric/Behavioral: Negative for dysphoric mood. The patient is not nervous/anxious.     Past Medical History:  Diagnosis Date  . Alcoholism in remission ([place]) 08/27/2007  . Anemia   . Arthritis    OA  . COPD 12/17/2006  . Diverticulosis 03/27/2007  . GERD 05/01/2007  . HYPERGLYCEMIA 08/27/2007  . HYPERLIPIDEMIA 12/17/2006  . HYPERTENSION 12/17/2006  . Mood disorder (HCC)    hx anxiety and depression  . Rectal polyp 03/27/2007   adenoma     Family History  Problem Relation Age of Onset  . Throat cancer Mother   . Heart attack Father 30  . Idiopathic pulmonary fibrosis Brother 27  . Cancer Brother        sarcoma  . Heart disease Brother   . Colon cancer  Neg Hx   . Esophageal cancer Neg Hx   . Stomach cancer Neg Hx      Social History   Socioeconomic History  . Marital status: Married    Spouse name: Not on file  . Number of children: 2  . Years of education: Not on file  . Highest education level: Not on file  Occupational History  . Occupation: retired  Scientific laboratory technician  . Financial resource strain: Not on file  . Food insecurity:    Worry: Not on file    Inability: Not on file  . Transportation needs:    Medical: Not on file     Non-medical: Not on file  Tobacco Use  . Smoking status: Former Smoker    Packs/day: 2.00    Years: 30.00    Pack years: 60.00    Types: Cigarettes    Last attempt to quit: 07/10/1984    Years since quitting: 33.3  . Smokeless tobacco: Never Used  Substance and Sexual Activity  . Alcohol use: No    Alcohol/week: 0.0 oz    Comment: no alcoho since 1983  . Drug use: No  . Sexual activity: Not on file  Lifestyle  . Physical activity:    Days per week: Not on file    Minutes per session: Not on file  . Stress: Not on file  Relationships  . Social connections:    Talks on phone: Not on file    Gets together: Not on file    Attends religious service: Not on file    Active member of club or organization: Not on file    Attends meetings of clubs or organizations: Not on file    Relationship status: Not on file  . Intimate partner violence:    Fear of current or ex partner: Not on file    Emotionally abused: Not on file    Physically abused: Not on file    Forced sexual activity: Not on file  Other Topics Concern  . Not on file  Social History Narrative   Work or School: very active in [employer] Situation: lives with husband      Spiritual Beliefs: Christian      Lifestyle:tries to walk; diet ok - denies any alcohol or tobacco use in many many years        Allergies  Allergen Reactions  . Alcohol   . Codeine     Patient prefers not to take this due to history of alcoholism  . Dilantin  [Phenytoin Sodium Extended]   . Dilaudid [Hydromorphone Hcl] Other (See Comments)    Pt had hypotension after dilaudid 1mg  IV while in the OR, required temporary pressor intervention.  . Diclofenac Sodium Rash     Outpatient Medications Prior to Visit  Medication Sig Dispense Refill  . acetaminophen ([employer] ARTHRITIS PAIN RELIEF) 650 MG CR tablet Take 1,300 mg by mouth 2 (two) times daily.    Marland Kitchen albuterol (PROAIR HFA) 108 (90 Base) MCG/ACT inhaler INHALE 2 PUFFS EVERY 4 HOURS  AS NEEDED FOR COUGHING SPELLS 8.5 Inhaler 2  . aspirin 81 MG tablet Take 81 mg by mouth daily.    Marland Kitchen azelastine (ASTELIN) 0.1 % nasal spray Place 1 spray into both nostrils 2 (two) times daily. Use in each nostril as directed    . azelastine (ASTELIN) 0.1 % nasal spray azelastine  Spray 1 x 2 times daily    . Coenzyme Q10 (COQ10 PO)  Take by mouth daily.    . ferrous sulfate 325 (65 FE) MG tablet TAKE 1 TABLET BY MOUTH EVERY DAY 90 tablet 3  . hydrochlorothiazide (HYDRODIURIL) 25 MG tablet Take 1 tablet (25 mg total) by mouth daily. 90 tablet 3  . ipratropium (ATROVENT) 0.06 % nasal spray Place 2 sprays into the nose 3 (three) times daily.    Marland Kitchen loratadine (CLARITIN) 10 MG tablet Take 1 tablet (10 mg total) by mouth daily. (Patient taking differently: Take 10 mg by mouth as needed. ) 90 tablet 3  . meclizine (ANTIVERT) 25 MG tablet Take 1 tablet (25 mg total) by mouth 3 (three) times daily as needed for dizziness. 30 tablet 0  . Multiple Vitamin (MULTIVITAMIN WITH MINERALS) TABS tablet Take 1 tablet by mouth daily.    . pantoprazole (PROTONIX) 40 MG tablet TAKE ONE TABLET BY MOUTH DAILY 60 tablet 3  . potassium chloride SA (K-DUR,KLOR-CON) 20 MEQ tablet TAKE 1 TABLET BY MOUTH DAILY 90 tablet 1  . ranitidine (ZANTAC) 300 MG tablet TAKE ONE TABLET BY MOUTH EVERY NIGHT AT BEDTIME 60 tablet 1  . simvastatin (ZOCOR) 20 MG tablet Take 1 tablet (20 mg total) by mouth every morning. 90 tablet 3  . Vitamin D, Cholecalciferol, 1000 units CAPS Take 1 capsule by mouth daily.     No facility-administered medications prior to visit.         Objective:   Physical Exam Vitals:   11/09/17 1410  BP: 112/62  Pulse: 82  SpO2: 91%  Weight: 190 lb (86.2 kg)  Height: 5\' 5"  (1.651 m)   Gen: Pleasant, well-nourished, in no distress,  normal affect  ENT: No lesions, mouth clear, a bit dry, no erythema  Neck: No JVD, no stridor  Lungs: No use of accessory muscles, clear without rales or  rhonchi  Cardiovascular: RRR, heart sounds normal, no murmur or gallops, no peripheral edema  Musculoskeletal: warm and well perfused  Neuro: alert, non focal  Skin: Warm, no lesions or rashes      Assessment & Plan:  COPD (chronic obstructive pulmonary disease) (HCC) Fairly stable, although she was treated for an Ae last month - sounds like this was mainly cough. She uses albuterol about every other day - she may needs to go back on a scheduled BD at some point.   Chronic cough Contributions from GERD and rhinitis. Will work to treat both.   Gastroesophageal reflux disease Continue protonix and zantac as she is doing, follow for breakthrough sx.   Allergic rhinitis Taking loratadine, but not astelin or atrovent NS Add back flonase, hold off on the other sprays due to cost.   Baltazar Apo, MD, PhD 11/09/2017, 2:34 PM [facility] [phone] or if no answer [phone]

## 2017-11-12 ENCOUNTER — Telehealth: Payer: Self-pay | Admitting: Emergency Medicine

## 2017-11-12 DIAGNOSIS — M79641 Pain in right hand: Secondary | ICD-10-CM | POA: Diagnosis not present

## 2017-11-12 DIAGNOSIS — M79642 Pain in left hand: Secondary | ICD-10-CM | POA: Diagnosis not present

## 2017-11-12 DIAGNOSIS — M13841 Other specified arthritis, right hand: Secondary | ICD-10-CM | POA: Diagnosis not present

## 2017-11-12 MED ORDER — FLUTICASONE PROPIONATE 50 MCG/ACT NA SUSP: 2.0000 | Freq: Every day | NASAL | 5 refills | Status: DC

## 2017-11-12 NOTE — Telephone Encounter (Signed)
Called and spoke to pt's husband. Pt is requesting flonase nasal spray sent to [name] on [address], rx sent to pharmacy. Pt's husband verbalized understanding and denied any further questions or concerns at this time.

## 2017-11-13 DIAGNOSIS — M25571 Pain in right ankle and joints of right foot: Secondary | ICD-10-CM | POA: Diagnosis not present

## 2017-11-13 DIAGNOSIS — M19079 Primary osteoarthritis, unspecified ankle and foot: Secondary | ICD-10-CM | POA: Diagnosis not present

## 2017-12-06 DIAGNOSIS — L812 Freckles: Secondary | ICD-10-CM | POA: Diagnosis not present

## 2017-12-06 DIAGNOSIS — L438 Other lichen planus: Secondary | ICD-10-CM | POA: Diagnosis not present

## 2017-12-06 DIAGNOSIS — Z85820 Personal history of malignant melanoma of skin: Secondary | ICD-10-CM | POA: Diagnosis not present

## 2017-12-06 DIAGNOSIS — L821 Other seborrheic keratosis: Secondary | ICD-10-CM | POA: Diagnosis not present

## 2017-12-06 DIAGNOSIS — D1801 Hemangioma of skin and subcutaneous tissue: Secondary | ICD-10-CM | POA: Diagnosis not present

## 2017-12-19 NOTE — Progress Notes (Signed)
HPI:  Using dictation device. Unfortunately this device frequently misinterprets words/phrases.   Sheila Shields is a pleasant 83 y.o. here for follow up. Chronic medical problems summarized below were reviewed for changes and stability and were updated as needed below. These issues and their treatment remain stable for the most part.  She recently went to a conference and was around a lot of people.  She started having some upper respiratory symptoms for last day or 2.  Symptoms include nasal congestion, sinus pain/pressure and a cough.  She used her albuterol once.  Denies fevers, malaise, significant wheezing or shortness of breath or excessive sputum production. Denies CP, SOB, DOE, treatment intolerance or new symptoms.  Vertigo: -See office visit notes 08/06/2017 -Presumed BPPV along with sinus issues  HTN: -meds: asa, losartan-hctz 100-12.5in the past -now hctz alone due to pt preference and less need with wt reduction  GERD: -prevacid 15  HLD/Obesity/Hyperglycemia: -meds: simvastatin, fish oil -doing Loose it App on phone and silver sneakers (04/2017) -wt 207 2/18 --> 195 (10/18) --> 191 (2/19) --> 192 (6/19)  Hx of Anemia, chronic: -dx and eval with prior PCP per report -takes iron daily  OA/chronic back pain.R ankle injury: -sees ortho - Dr. Maxie Better and Dr. Nelva Bush -take tylenol most days  COPD/Chronic Cough/Allergies: -seeing pulm    ROS: See pertinent positives and negatives per HPI.  Past Medical History:  Diagnosis Date  . Alcoholism in remission ([place]) 08/27/2007  . Anemia   . Arthritis    OA  . COPD 12/17/2006  . Diverticulosis 03/27/2007  . GERD 05/01/2007  . HYPERGLYCEMIA 08/27/2007  . HYPERLIPIDEMIA 12/17/2006  . HYPERTENSION 12/17/2006  . Mood disorder (HCC)    hx anxiety and depression  . Rectal polyp 03/27/2007   adenoma    Past Surgical History:  Procedure Laterality Date  . ABDOMINAL HYSTERECTOMY  1978   fibroma  . APPENDECTOMY  2002  .  BREAST BIOPSY     negative  . BREAST EXCISIONAL BIOPSY Right    2003  . CARPAL TUNNEL RELEASE Left 2010 or 2011  . COLONOSCOPY W/ POLYPECTOMY  03/27/2007; n7/19/2012   2008: 4 mm adenoma, diverticulosis 2012: ileitis ? NSAID - likely, 2-3 mm cecal polyp LYMPHOID FOLLICLE, diverticulosis  . ESOPHAGOGASTRODUODENOSCOPY  01/26/2011   reflux esophagitis, colonscopy done also  . HAMMER TOE SURGERY  2008   left foot  . HERNIA REPAIR Right 1996?  . ORIF ANKLE FRACTURE Right 12/22/2014   Procedure: OPEN REDUCTION INTERNAL FIXATION (ORIF) ANKLE FRACTURE;  Surgeon: Susa Day, MD;  Location: [facility];  Service: Orthopedics;  Laterality: Right;  . SHOULDER OPEN ROTATOR CUFF REPAIR Left 03/06/2013   Procedure: LEFT ROTATOR CUFF REPAIR, SUBACROMIAL DECOMPRESSION, PATCH GRAFT, MANIPULATION UNDER ANESTHESIA;  Surgeon: Johnn Hai, MD;  Location: [facility];  Service: Orthopedics;  Laterality: Left;    Family History  Problem Relation Age of Onset  . Throat cancer Mother   . Heart attack Father 92  . Idiopathic pulmonary fibrosis Brother 69  . Cancer Brother        sarcoma  . Heart disease Brother   . Colon cancer Neg Hx   . Esophageal cancer Neg Hx   . Stomach cancer Neg Hx     SOCIAL HX: see hpi   Current Outpatient Medications:  .  acetaminophen ([employer]S ARTHRITIS PAIN RELIEF) 650 MG CR tablet, Take 1,300 mg by mouth 2 (two) times daily., Disp: , Rfl:  .  albuterol (PROAIR HFA) 108 (90 Base) MCG/ACT  inhaler, INHALE 2 PUFFS EVERY 4 HOURS AS NEEDED FOR COUGHING SPELLS, Disp: 8.5 Inhaler, Rfl: 2 .  aspirin 81 MG tablet, Take 81 mg by mouth daily., Disp: , Rfl:  .  azelastine (ASTELIN) 0.1 % nasal spray, azelastine  Spray 1 x 2 times daily, Disp: , Rfl:  .  Coenzyme Q10 (COQ10 PO), Take by mouth daily., Disp: , Rfl:  .  ferrous sulfate 325 (65 FE) MG tablet, TAKE 1 TABLET BY MOUTH EVERY DAY, Disp: 90 tablet, Rfl: 3 .  fluticasone (FLONASE) 50 MCG/ACT nasal spray, Place 2 sprays into both nostrils  daily., Disp: 16 g, Rfl: 5 .  hydrochlorothiazide (HYDRODIURIL) 25 MG tablet, Take 1 tablet (25 mg total) by mouth daily., Disp: 90 tablet, Rfl: 3 .  loratadine (CLARITIN) 10 MG tablet, Take 1 tablet (10 mg total) by mouth daily. (Patient taking differently: Take 10 mg by mouth as needed. ), Disp: 90 tablet, Rfl: 3 .  meclizine (ANTIVERT) 25 MG tablet, Take 1 tablet (25 mg total) by mouth 3 (three) times daily as needed for dizziness., Disp: 30 tablet, Rfl: 0 .  Multiple Vitamin (MULTIVITAMIN WITH MINERALS) TABS tablet, Take 1 tablet by mouth daily., Disp: , Rfl:  .  pantoprazole (PROTONIX) 40 MG tablet, TAKE ONE TABLET BY MOUTH DAILY, Disp: 60 tablet, Rfl: 3 .  potassium chloride SA (K-DUR,KLOR-CON) 20 MEQ tablet, TAKE 1 TABLET BY MOUTH DAILY, Disp: 90 tablet, Rfl: 1 .  ranitidine (ZANTAC) 300 MG tablet, TAKE ONE TABLET BY MOUTH EVERY NIGHT AT BEDTIME, Disp: 60 tablet, Rfl: 1 .  simvastatin (ZOCOR) 20 MG tablet, Take 1 tablet (20 mg total) by mouth every morning., Disp: 90 tablet, Rfl: 3 .  Vitamin D, Cholecalciferol, 1000 units CAPS, Take 1 capsule by mouth daily., Disp: , Rfl:   EXAM:  Vitals:   12/20/17 0846  BP: 112/72  Pulse: 74  Temp: 98.6 F (37 C)  SpO2: 96%    Body mass index is 31.98 kg/m.  GENERAL: vitals reviewed and listed above, alert, oriented, appears well hydrated and in no acute distress  HEENT: atraumatic, conjunttiva clear, no obvious abnormalities on inspection of external nose and ears, normal appearance of ear canals and TMs, clear nasal congestion, mild post oropharyngeal erythema with PND, no tonsillar edema or exudate, no sinus TTP  NECK: no obvious masses on inspection  LUNGS: clear to auscultation bilaterally, no wheezes, rales or rhonchi, good air movement  CV: HRRR, SEM I/VI, no peripheral edema  MS: moves all extremities without noticeable abnormality  PSYCH: pleasant and cooperative, no obvious depression or anxiety  ASSESSMENT AND  PLAN:  Discussed the following assessment and plan:  Viral upper respiratory illness  Chronic obstructive pulmonary disease with acute exacerbation (HCC)  Essential hypertension  Hyperlipidemia, unspecified hyperlipidemia type  Hyperglycemia  Obesity (BMI 30.0-34.9)   -symptomatic care for likely VURI - advised prompt follow up if SOB, thick sputum, fevers, worsening, etc. Alb prn per instructions. -lifestyle recs -labs next visit since not feeling well today -Patient advised to return or notify a doctor immediately if symptoms worsen or persist or new concerns arise.  Patient Instructions  BEFORE YOU LEAVE: -follow up: 3-4 months, labs then  Get some nasal saline and use the albuterol as needed. Follow up if any worsening or not improving as expected - I hope you are feeling better soon!   We recommend the following healthy lifestyle for LIFE: 1) Small portions. But, make sure to get regular (at least 3 per day),  healthy meals and small healthy snacks if needed.  2) Eat a healthy clean diet.   TRY TO EAT: -at least 5-7 servings of low sugar, colorful, and nutrient rich vegetables per day (not corn, potatoes or bananas.) -berries are the best choice if you wish to eat fruit (only eat small amounts if trying to reduce weight)  -lean meets (fish, white meat of chicken or [place]) -vegan proteins for some meals - beans or tofu, whole grains, nuts and seeds -Replace bad fats with good fats - good fats include: fish, nuts and seeds, canola oil, olive oil -small amounts of low fat or non fat dairy -small amounts of100 % whole grains - check the lables -drink plenty of water  AVOID: -SUGAR, sweets, anything with added sugar, corn syrup or sweeteners - must read labels as even foods advertised as "healthy" often are loaded with sugar -if you must have a sweetener, small amounts of stevia may be best -sweetened beverages and artificially sweetened beverages -simple starches  (rice, bread, potatoes, pasta, chips, etc - small amounts of 100% whole grains are ok) -red meat, pork, butter -fried foods, fast food, processed food, excessive dairy, eggs and coconut.  3)Get at least 150 minutes of sweaty aerobic exercise per week.  4)Reduce stress - consider counseling, meditation and relaxation to balance other aspects of your life.     Lucretia Kern, DO

## 2017-12-20 ENCOUNTER — Ambulatory Visit (INDEPENDENT_AMBULATORY_CARE_PROVIDER_SITE_OTHER): Payer: Medicare HMO | Admitting: Family Medicine

## 2017-12-20 ENCOUNTER — Encounter: Payer: Self-pay | Admitting: Family Medicine

## 2017-12-20 VITALS — BP 112/72 | HR 74 | Temp 98.6°F | Ht 65.0 in | Wt 192.2 lb

## 2017-12-20 DIAGNOSIS — J069 Acute upper respiratory infection, unspecified: Secondary | ICD-10-CM

## 2017-12-20 DIAGNOSIS — E785 Hyperlipidemia, unspecified: Secondary | ICD-10-CM | POA: Diagnosis not present

## 2017-12-20 DIAGNOSIS — E669 Obesity, unspecified: Secondary | ICD-10-CM | POA: Diagnosis not present

## 2017-12-20 DIAGNOSIS — I10 Essential (primary) hypertension: Secondary | ICD-10-CM | POA: Diagnosis not present

## 2017-12-20 DIAGNOSIS — J441 Chronic obstructive pulmonary disease with (acute) exacerbation: Secondary | ICD-10-CM | POA: Diagnosis not present

## 2017-12-20 DIAGNOSIS — R739 Hyperglycemia, unspecified: Secondary | ICD-10-CM | POA: Diagnosis not present

## 2017-12-20 NOTE — Patient Instructions (Signed)
BEFORE YOU LEAVE: -follow up: 3-4 months, labs then  Get some nasal saline and use the albuterol as needed. Follow up if any worsening or not improving as expected - I hope you are feeling better soon!   We recommend the following healthy lifestyle for LIFE: 1) Small portions. But, make sure to get regular (at least 3 per day), healthy meals and small healthy snacks if needed.  2) Eat a healthy clean diet.   TRY TO EAT: -at least 5-7 servings of low sugar, colorful, and nutrient rich vegetables per day (not corn, potatoes or bananas.) -berries are the best choice if you wish to eat fruit (only eat small amounts if trying to reduce weight)  -lean meets (fish, white meat of chicken or [place]) -vegan proteins for some meals - beans or tofu, whole grains, nuts and seeds -Replace bad fats with good fats - good fats include: fish, nuts and seeds, canola oil, olive oil -small amounts of low fat or non fat dairy -small amounts of100 % whole grains - check the lables -drink plenty of water  AVOID: -SUGAR, sweets, anything with added sugar, corn syrup or sweeteners - must read labels as even foods advertised as "healthy" often are loaded with sugar -if you must have a sweetener, small amounts of stevia may be best -sweetened beverages and artificially sweetened beverages -simple starches (rice, bread, potatoes, pasta, chips, etc - small amounts of 100% whole grains are ok) -red meat, pork, butter -fried foods, fast food, processed food, excessive dairy, eggs and coconut.  3)Get at least 150 minutes of sweaty aerobic exercise per week.  4)Reduce stress - consider counseling, meditation and relaxation to balance other aspects of your life.

## 2017-12-26 ENCOUNTER — Ambulatory Visit: Payer: Medicare HMO | Admitting: Gastroenterology

## 2017-12-26 ENCOUNTER — Encounter: Payer: Self-pay | Admitting: Gastroenterology

## 2017-12-26 VITALS — BP 110/60 | HR 74 | Ht 65.0 in | Wt 189.0 lb

## 2017-12-26 DIAGNOSIS — K219 Gastro-esophageal reflux disease without esophagitis: Secondary | ICD-10-CM | POA: Diagnosis not present

## 2017-12-26 DIAGNOSIS — R05 Cough: Secondary | ICD-10-CM

## 2017-12-26 DIAGNOSIS — R053 Chronic cough: Secondary | ICD-10-CM

## 2017-12-26 MED ORDER — PANTOPRAZOLE SODIUM 40 MG PO TBEC: 40.0000 mg | DELAYED_RELEASE_TABLET | Freq: Every day | ORAL | 3 refills | Status: DC

## 2017-12-26 MED ORDER — RANITIDINE HCL 300 MG PO TABS: 300.0000 mg | ORAL_TABLET | Freq: Every day | ORAL | 3 refills | Status: DC

## 2017-12-26 NOTE — Patient Instructions (Signed)
We have sent the following medications to your pharmacy for you to pick up at your convenience: Pantoprazole 40 mg daily  Zantac 300 mg at bedtime

## 2017-12-26 NOTE — Progress Notes (Addendum)
12/26/2017 Sheila Shields [id number] 01-07-1935   HISTORY OF PRESENT ILLNESS:  This is a pleasant 83 year old female who I saw in 2018 for complaints of chronic cough.  Symptoms completely resolved on pantoprazole 40 mg daily and zantac 300 mg.  Continuing to do very well with that regimen.  Says that she feels absolutely great.  Needs her medications refilled, which is why she is here today.  Says that she's had absolutely no symptoms including cough, heartburn/reflux, etc.   Past Medical History:  Diagnosis Date  . Alcoholism in remission ([place]) 08/27/2007  . Anemia   . Arthritis    OA  . COPD 12/17/2006  . Diverticulosis 03/27/2007  . GERD 05/01/2007  . HYPERGLYCEMIA 08/27/2007  . HYPERLIPIDEMIA 12/17/2006  . HYPERTENSION 12/17/2006  . Mood disorder (HCC)    hx anxiety and depression  . Rectal polyp 03/27/2007   adenoma   Past Surgical History:  Procedure Laterality Date  . ABDOMINAL HYSTERECTOMY  1978   fibroma  . APPENDECTOMY  2002  . BREAST BIOPSY     negative  . BREAST EXCISIONAL BIOPSY Right    2003  . CARPAL TUNNEL RELEASE Left 2010 or 2011  . COLONOSCOPY W/ POLYPECTOMY  03/27/2007; n7/19/2012   2008: 4 mm adenoma, diverticulosis 2012: ileitis ? NSAID - likely, 2-3 mm cecal polyp LYMPHOID FOLLICLE, diverticulosis  . ESOPHAGOGASTRODUODENOSCOPY  01/26/2011   reflux esophagitis, colonscopy done also  . HAMMER TOE SURGERY  2008   left foot  . HERNIA REPAIR Right 1996?  . ORIF ANKLE FRACTURE Right 12/22/2014   Procedure: OPEN REDUCTION INTERNAL FIXATION (ORIF) ANKLE FRACTURE;  Surgeon: Susa Day, MD;  Location: [facility];  Service: Orthopedics;  Laterality: Right;  . SHOULDER OPEN ROTATOR CUFF REPAIR Left 03/06/2013   Procedure: LEFT ROTATOR CUFF REPAIR, SUBACROMIAL DECOMPRESSION, PATCH GRAFT, MANIPULATION UNDER ANESTHESIA;  Surgeon: Johnn Hai, MD;  Location: [facility];  Service: Orthopedics;  Laterality: Left;    reports that she quit smoking about 33 years ago. Her  smoking use included cigarettes. She has a 60.00 pack-year smoking history. She has never used smokeless tobacco. She reports that she does not drink alcohol or use drugs. family history includes Cancer in her brother; Heart attack (age of onset: 55) in her father; Heart disease in her brother; Idiopathic pulmonary fibrosis (age of onset: 28) in her brother; Throat cancer in her mother. Allergies  Allergen Reactions  . Alcohol   . Codeine     Patient prefers not to take this due to history of alcoholism  . Diclofenac Sodium Rash      Outpatient Encounter Medications as of 12/26/2017  Medication Sig  . acetaminophen ([employer] ARTHRITIS PAIN RELIEF) 650 MG CR tablet Take 1,300 mg by mouth 2 (two) times daily.  Marland Kitchen albuterol (PROAIR HFA) 108 (90 Base) MCG/ACT inhaler INHALE 2 PUFFS EVERY 4 HOURS AS NEEDED FOR COUGHING SPELLS  . aspirin 81 MG tablet Take 81 mg by mouth daily.  . Coenzyme Q10 (COQ10 PO) Take by mouth daily.  . ferrous sulfate 325 (65 FE) MG tablet TAKE 1 TABLET BY MOUTH EVERY DAY  . fluticasone (FLONASE) 50 MCG/ACT nasal spray Place 2 sprays into both nostrils daily.  . hydrochlorothiazide (HYDRODIURIL) 25 MG tablet Take 1 tablet (25 mg total) by mouth daily.  Marland Kitchen loratadine (CLARITIN) 10 MG tablet Take 1 tablet (10 mg total) by mouth daily. (Patient taking differently: Take 10 mg by mouth as needed. )  . meclizine (ANTIVERT)  25 MG tablet Take 1 tablet (25 mg total) by mouth 3 (three) times daily as needed for dizziness.  . Multiple Vitamin (MULTIVITAMIN WITH MINERALS) TABS tablet Take 1 tablet by mouth daily.  . pantoprazole (PROTONIX) 40 MG tablet TAKE ONE TABLET BY MOUTH DAILY  . potassium chloride SA (K-DUR,KLOR-CON) 20 MEQ tablet TAKE 1 TABLET BY MOUTH DAILY  . ranitidine (ZANTAC) 300 MG tablet TAKE ONE TABLET BY MOUTH EVERY NIGHT AT BEDTIME  . simvastatin (ZOCOR) 20 MG tablet Take 1 tablet (20 mg total) by mouth every morning.  . Vitamin D, Cholecalciferol, 1000 units CAPS Take 1  capsule by mouth daily.  . [DISCONTINUED] azelastine (ASTELIN) 0.1 % nasal spray azelastine  Spray 1 x 2 times daily   No facility-administered encounter medications on file as of 12/26/2017.      REVIEW OF SYSTEMS  : All other systems reviewed and negative except where noted in the History of Present Illness.   PHYSICAL EXAM: BP 110/60   Pulse 74   Ht 5\' 5"  (1.651 m)   Wt 189 lb (85.7 kg)   BMI 31.45 kg/m  General: Well developed white female in no acute distress Head: Normocephalic and atraumatic Eyes:  Sclerae anicteric, conjunctiva pink. Ears: Normal auditory acuity Lungs: Clear throughout to auscultation; no increased WOB. Heart: Regular rate and rhythm; no M/R/G. Abdomen: Soft, non-distended.  Normal bowel sounds.  Non-tender. Musculoskeletal: Symmetrical with no gross deformities  Skin: No lesions on visible extremities Extremities: No edema  Neurological: Alert oriented x 4, grossly non-focal Psychological:  Alert and cooperative. Normal mood and affect  ASSESSMENT AND PLAN: *GERD and chronic cough:  Completely resolved on pantoprazole 40 mg daily and zantac 300 mg at bedtime.  Will continue.  Will refill those medications.  CC:  Lucretia Kern, DO  Agree with Ms. Alphia Kava management.  Gatha Mayer, MD, [facility]

## 2017-12-27 DIAGNOSIS — H903 Sensorineural hearing loss, bilateral: Secondary | ICD-10-CM | POA: Diagnosis not present

## 2017-12-31 ENCOUNTER — Encounter: Payer: Self-pay | Admitting: Family Medicine

## 2018-01-07 ENCOUNTER — Ambulatory Visit: Payer: Self-pay | Admitting: Family Medicine

## 2018-01-07 ENCOUNTER — Encounter: Payer: Self-pay | Admitting: Family Medicine

## 2018-01-07 NOTE — Telephone Encounter (Signed)
I called the pt and scheduled an appt for 7/2 at 10:30am.

## 2018-01-07 NOTE — Telephone Encounter (Signed)
Dr Maudie Mercury, are you able to work this patient in today for ongoing cough? Pt tried to schedule via MyChart and you are booked. Pt is currently scheduled with Dr Martinique today at [place].  Please advise. Thanks.

## 2018-01-08 ENCOUNTER — Encounter: Payer: Self-pay | Admitting: Family Medicine

## 2018-01-08 ENCOUNTER — Ambulatory Visit (INDEPENDENT_AMBULATORY_CARE_PROVIDER_SITE_OTHER): Payer: Medicare HMO | Admitting: Family Medicine

## 2018-01-08 VITALS — BP 118/68 | HR 81 | Temp 97.9°F | Ht 65.0 in | Wt 188.7 lb

## 2018-01-08 DIAGNOSIS — J441 Chronic obstructive pulmonary disease with (acute) exacerbation: Secondary | ICD-10-CM | POA: Diagnosis not present

## 2018-01-08 MED ORDER — PREDNISONE 20 MG PO TABS: 40.0000 mg | ORAL_TABLET | Freq: Every day | ORAL | 0 refills | Status: DC

## 2018-01-08 NOTE — Progress Notes (Signed)
HPI:  Using dictation device. Unfortunately this device frequently misinterprets words/phrases.  Acute visit for Cough: -hx COPD -VUR symptoms started 6/12 (sinus congestion, cough, sinus pressure initially) -today reports:had a bad cold, then it settled into a bad cough and some wheezing, has been using alb a few times per day but it is not helping much -denies: fever, thick sputum, cough, SOB, malaise  ROS: See pertinent positives and negatives per HPI.  Past Medical History:  Diagnosis Date  . Alcoholism in remission ([place]) 08/27/2007  . Anemia   . Arthritis    OA  . COPD 12/17/2006  . Diverticulosis 03/27/2007  . GERD 05/01/2007  . HYPERGLYCEMIA 08/27/2007  . HYPERLIPIDEMIA 12/17/2006  . HYPERTENSION 12/17/2006  . Mood disorder (HCC)    hx anxiety and depression  . Rectal polyp 03/27/2007   adenoma    Past Surgical History:  Procedure Laterality Date  . ABDOMINAL HYSTERECTOMY  1978   fibroma  . APPENDECTOMY  2002  . BREAST BIOPSY     negative  . BREAST EXCISIONAL BIOPSY Right    2003  . CARPAL TUNNEL RELEASE Left 2010 or 2011  . COLONOSCOPY W/ POLYPECTOMY  03/27/2007; n7/19/2012   2008: 4 mm adenoma, diverticulosis 2012: ileitis ? NSAID - likely, 2-3 mm cecal polyp LYMPHOID FOLLICLE, diverticulosis  . ESOPHAGOGASTRODUODENOSCOPY  01/26/2011   reflux esophagitis, colonscopy done also  . HAMMER TOE SURGERY  2008   left foot  . HERNIA REPAIR Right 1996?  . ORIF ANKLE FRACTURE Right 12/22/2014   Procedure: OPEN REDUCTION INTERNAL FIXATION (ORIF) ANKLE FRACTURE;  Surgeon: Susa Day, MD;  [facility];  Service: Orthopedics;  Laterality: Right;  . SHOULDER OPEN ROTATOR CUFF REPAIR Left 03/06/2013   Procedure: LEFT ROTATOR CUFF REPAIR, SUBACROMIAL DECOMPRESSION, PATCH GRAFT, MANIPULATION UNDER ANESTHESIA;  Surgeon: Johnn Hai, MD;  [facility];  Service: Orthopedics;  Laterality: Left;    Family History  Problem Relation Age of Onset  . Throat cancer Mother    . Heart attack Father 54  . Idiopathic pulmonary fibrosis Brother 38  . Cancer Brother        sarcoma  . Heart disease Brother   . Colon cancer Neg Hx   . Esophageal cancer Neg Hx   . Stomach cancer Neg Hx     SOCIAL HX: see hpi   Current Outpatient Medications:  .  acetaminophen ([employer]S ARTHRITIS PAIN RELIEF) 650 MG CR tablet, Take 1,300 mg by mouth 2 (two) times daily., Disp: , Rfl:  .  albuterol (PROAIR HFA) 108 (90 Base) MCG/ACT inhaler, INHALE 2 PUFFS EVERY 4 HOURS AS NEEDED FOR COUGHING SPELLS, Disp: 8.5 Inhaler, Rfl: 2 .  aspirin 81 MG tablet, Take 81 mg by mouth daily., Disp: , Rfl:  .  Coenzyme Q10 (COQ10 PO), Take by mouth daily., Disp: , Rfl:  .  ferrous sulfate 325 (65 FE) MG tablet, TAKE 1 TABLET BY MOUTH EVERY DAY, Disp: 90 tablet, Rfl: 3 .  fluticasone (FLONASE) 50 MCG/ACT nasal spray, Place 2 sprays into both nostrils daily., Disp: 16 g, Rfl: 5 .  hydrochlorothiazide (HYDRODIURIL) 25 MG tablet, Take 1 tablet (25 mg total) by mouth daily., Disp: 90 tablet, Rfl: 3 .  loratadine (CLARITIN) 10 MG tablet, Take 1 tablet (10 mg total) by mouth daily. (Patient taking differently: Take 10 mg by mouth as needed. ), Disp: 90 tablet, Rfl: 3 .  meclizine (ANTIVERT) 25 MG tablet, Take 1 tablet (25 mg total) by mouth 3 (three) times daily as  needed for dizziness., Disp: 30 tablet, Rfl: 0 .  Multiple Vitamin (MULTIVITAMIN WITH MINERALS) TABS tablet, Take 1 tablet by mouth daily., Disp: , Rfl:  .  pantoprazole (PROTONIX) 40 MG tablet, Take 1 tablet (40 mg total) by mouth daily., Disp: 90 tablet, Rfl: 3 .  potassium chloride SA (K-DUR,KLOR-CON) 20 MEQ tablet, TAKE 1 TABLET BY MOUTH DAILY, Disp: 90 tablet, Rfl: 1 .  ranitidine (ZANTAC) 300 MG tablet, Take 1 tablet (300 mg total) by mouth at bedtime., Disp: 90 tablet, Rfl: 3 .  simvastatin (ZOCOR) 20 MG tablet, Take 1 tablet (20 mg total) by mouth every morning., Disp: 90 tablet, Rfl: 3 .  Vitamin D, Cholecalciferol, 1000 units CAPS, Take 1  capsule by mouth daily., Disp: , Rfl:  .  predniSONE (DELTASONE) 20 MG tablet, Take 2 tablets (40 mg total) by mouth daily with breakfast., Disp: 8 tablet, Rfl: 0  EXAM:  Vitals:   01/08/18 1018  BP: 118/68  Pulse: 81  Temp: 97.9 F (36.6 C)  SpO2: 92%    Body mass index is 31.4 kg/m.  GENERAL: vitals reviewed and listed above, alert, oriented, appears well hydrated and in no acute distress  HEENT: atraumatic, conjunttiva clear, no obvious abnormalities on inspection of external nose and ears  NECK: no obvious masses on inspection  LUNGS: clear to auscultation bilaterally, no wheezes, rales or rhonchi, good air movement  CV: HRRR, no peripheral edema  MS: moves all extremities without noticeable abnormality  PSYCH: pleasant and cooperative, no obvious depression or anxiety  ASSESSMENT AND PLAN:  Discussed the following assessment and plan:  COPD with exacerbation ([place])  -opted for prednisone after discussion options/risks/benefits -follow up prn -Patient advised to return or notify a doctor immediately if symptoms worsen or persist or new concerns arise.  Patient Instructions  Take the prednisone.  I hope you are feeling better soon! Seek care promptly if your symptoms worsen, new concerns arise or you are not improving with treatment.     Lucretia Kern, DO

## 2018-01-08 NOTE — Patient Instructions (Signed)
Take the prednisone.  I hope you are feeling better soon! Seek care promptly if your symptoms worsen, new concerns arise or you are not improving with treatment.

## 2018-01-15 DIAGNOSIS — M25571 Pain in right ankle and joints of right foot: Secondary | ICD-10-CM | POA: Diagnosis not present

## 2018-01-15 DIAGNOSIS — M19079 Primary osteoarthritis, unspecified ankle and foot: Secondary | ICD-10-CM | POA: Diagnosis not present

## 2018-01-26 ENCOUNTER — Other Ambulatory Visit: Payer: Self-pay | Admitting: Family Medicine

## 2018-02-15 DIAGNOSIS — Z0100 Encounter for examination of eyes and vision without abnormal findings: Secondary | ICD-10-CM | POA: Diagnosis not present

## 2018-02-19 DIAGNOSIS — R69 Illness, unspecified: Secondary | ICD-10-CM | POA: Diagnosis not present

## 2018-02-25 NOTE — Progress Notes (Signed)
Subjective:   Sheila Shields is a 83 y.o. female who presents for Medicare Annual (Subsequent) preventive examination.  Reports health as very good  Female preventive screens Colonoscopy 03/2007 - aged out  Mammogram; 10/26/2017  Dexa; 01/2015 NOrmal  Smoking hx x 30 pack years;  Did a chest xray normal  Ankle fx still gives her some issues Treated by Dr. Tonita Cong ongoing  Her husband is doing well  Married after they met 2004 Celebrated x 13 years   Diet BMI 31.3  Dr. Maudie Mercury wanted her <190 and she met this goal Eats breakfast, lunch and dinner Does not fry; fresh vegetables  Bagel for breakfast Lunch varies ;smoothes;  2 scoops of ice cream; SB or peaches; mango;  App called "lose it"   Pre-diabetic is 5.9 from 6.4   Exercise (OA stenosis)  Can't walk due to hips or glut - massage 2 times  Kneaded energy; Amanda bi annual   DVD's stronger seniors  One 3 times a week and alternates  Stretching and weights and balance   Hearing Screening Comments: q 2 years to go their ears checked   If there is noise in the background, she has issues discerning what is being said  Hearing check in June No issues  Vision Screening Comments: Vision checks  q years;  Dr. Cena Benton is now retired at Dr. Zenia Res[facility] practice Has 2 sons in his practice    Health Maintenance Due  Topic Date Due  . INFLUENZA VACCINE  02/07/2018     flu vaccines are not available as yet      Objective:     Vitals: BP 122/62   Pulse 71   Ht '5\' 5"'  (1.651 m)   Wt 188 lb (85.3 kg)   SpO2 91%   BMI 31.28 kg/m   Body mass index is 31.28 kg/m.  Advanced Directives 02/22/2017 09/22/2016 12/22/2014 03/06/2013 02/21/2013  Does Patient Have a Medical Advance Directive? Yes Yes No Patient has advance directive, copy not in chart Patient has advance directive, copy not in chart  Type of Advance Directive - [place];Living will - [place];Living will [place];Living will  Copy of [place] in Chart? - No - copy requested - Copy requested from family -  Pre-existing out of facility DNR order (yellow form or pink MOST form) - - - No No    Tobacco Social History   Tobacco Use  Smoking Status Former Smoker  . Packs/day: 2.00  . Years: 30.00  . Pack years: 60.00  . Types: Cigarettes  . Last attempt to quit: 07/10/1984  . Years since quitting: 33.6  Smokeless Tobacco Never Used     Counseling given: Yes   Clinical Intake:  Past Medical History:  Diagnosis Date  . Alcoholism in remission ([place]) 08/27/2007  . Anemia   . Arthritis    OA  . COPD 12/17/2006  . Diverticulosis 03/27/2007  . GERD 05/01/2007  . HYPERGLYCEMIA 08/27/2007  . HYPERLIPIDEMIA 12/17/2006  . HYPERTENSION 12/17/2006  . Mood disorder (HCC)    hx anxiety and depression  . Rectal polyp 03/27/2007   adenoma   Past Surgical History:  Procedure Laterality Date  . ABDOMINAL HYSTERECTOMY  1978   fibroma  . APPENDECTOMY  2002  . BREAST BIOPSY     negative  . BREAST EXCISIONAL BIOPSY Right    2003  . CARPAL TUNNEL RELEASE Left 2010 or 2011  . COLONOSCOPY W/ POLYPECTOMY  03/27/2007; n7/19/2012   2008: 4 mm adenoma, diverticulosis 2012: ileitis ? NSAID - likely, 2-3 mm cecal polyp LYMPHOID FOLLICLE, diverticulosis  . ESOPHAGOGASTRODUODENOSCOPY  01/26/2011   reflux esophagitis, colonscopy done also  . HAMMER TOE SURGERY  2008   left foot  . HERNIA REPAIR Right 1996?  . ORIF ANKLE FRACTURE Right 12/22/2014   Procedure: OPEN REDUCTION INTERNAL FIXATION (ORIF) ANKLE FRACTURE;  Surgeon: Susa Day, MD;  [facility];  Service: Orthopedics;  Laterality: Right;  . SHOULDER OPEN ROTATOR CUFF REPAIR Left 03/06/2013   Procedure: LEFT ROTATOR CUFF REPAIR, SUBACROMIAL DECOMPRESSION, PATCH GRAFT, MANIPULATION UNDER ANESTHESIA;  Surgeon: Johnn Hai, MD;  Location[facility];  Service: Orthopedics;  Laterality: Left;   Family History  Problem Relation Age  of Onset  . Throat cancer Mother   . Heart attack Father 30  . Idiopathic pulmonary fibrosis Brother 27  . Cancer Brother        sarcoma  . Heart disease Brother   . Colon cancer Neg Hx   . Esophageal cancer Neg Hx   . Stomach cancer Neg Hx    Social History   Socioeconomic History  . Marital status: Married    Spouse name: Not on file  . Number of children: 2  . Years of education: Not on file  . Highest education level: Not on file  Occupational History  . Occupation: retired  Scientific laboratory technician  . Financial resource strain: Not on file  . Food insecurity:    Worry: Not on file    Inability: Not on file  . Transportation needs:    Medical: Not on file    Non-medical: Not on file  Tobacco Use  . Smoking status: Former Smoker    Packs/day: 2.00    Years: 30.00    Pack years: 60.00    Types: Cigarettes    Last attempt to quit: 07/10/1984    Years since quitting: 33.6  . Smokeless tobacco: Never Used  Substance and Sexual Activity  . Alcohol use: No    Alcohol/week: 0.0 standard drinks    Comment: no alcoho since 1983  . Drug use: No  . Sexual activity: Not on file  Lifestyle  . Physical activity:    Days per week: Not on file    Minutes per session: Not on file  . Stress: Not on file  Relationships  . Social connections:    Talks on phone: Not on file    Gets together: Not on file    Attends religious service: Not on file    Active member of club or organization: Not on file    Attends meetings of clubs or organizations: Not on file    Relationship status: Not on file  Other Topics Concern  . Not on file  Social History Narrative   Work or School: very active in [place] Situation: lives with husband      Spiritual Beliefs: Christian      Lifestyle:tries to walk; diet ok - denies any alcohol or tobacco use in many many years       Outpatient Encounter Medications as of 02/26/2018  Medication Sig  . acetaminophen ([employer]S ARTHRITIS PAIN RELIEF)  650 MG CR tablet Take 1,300 mg by mouth 2 (two) times daily.  Marland Kitchen albuterol (PROAIR HFA) 108 (90 Base) MCG/ACT inhaler INHALE 2 PUFFS EVERY 4 HOURS AS NEEDED FOR COUGHING SPELLS  . aspirin 81 MG tablet Take 81 mg by  mouth daily.  . Coenzyme Q10 (COQ10 PO) Take by mouth daily.  . ferrous sulfate 325 (65 FE) MG tablet TAKE 1 TABLET BY MOUTH EVERY DAY  . fluticasone (FLONASE) 50 MCG/ACT nasal spray Place 2 sprays into both nostrils daily.  . hydrochlorothiazide (HYDRODIURIL) 25 MG tablet Take 1 tablet (25 mg total) by mouth daily.  Marland Kitchen loratadine (CLARITIN) 10 MG tablet Take 1 tablet (10 mg total) by mouth daily. (Patient taking differently: Take 10 mg by mouth as needed. )  . meclizine (ANTIVERT) 25 MG tablet Take 1 tablet (25 mg total) by mouth 3 (three) times daily as needed for dizziness.  . Multiple Vitamin (MULTIVITAMIN WITH MINERALS) TABS tablet Take 1 tablet by mouth daily.  . pantoprazole (PROTONIX) 40 MG tablet Take 1 tablet (40 mg total) by mouth daily.  . potassium chloride SA (K-DUR,KLOR-CON) 20 MEQ tablet TAKE 1 TABLET BY MOUTH DAILY  . predniSONE (DELTASONE) 20 MG tablet Take 2 tablets (40 mg total) by mouth daily with breakfast.  . ranitidine (ZANTAC) 300 MG tablet Take 1 tablet (300 mg total) by mouth at bedtime.  . simvastatin (ZOCOR) 20 MG tablet Take 1 tablet (20 mg total) by mouth every morning.  . Vitamin D, Cholecalciferol, 1000 units CAPS Take 1 capsule by mouth daily.   No facility-administered encounter medications on file as of 02/26/2018.     Activities of Daily Living No flowsheet data found.  Patient Care Team: Lucretia Kern, DO as PCP - General (Family Medicine)    Assessment:   This is a routine wellness examination for Brenlynn.  Exercise Activities and Dietary recommendations    Goals    . Weight (lb) < 190 lb (86.2 kg)     Will watch sweet intake- come up w a plan  Will consider cutting portions out    Fat free or low fat dairy products Fish high in  omega-3 acids ( salmon, tuna, trout) Fruits, such as apples, bananas, oranges, pears, prunes Legumes, such as kidney beans, lentils, checkpeas, black-eyed peas and lima beans Vegetables; broccoli, cabbage, carrots Whole grains;   Plant fats are better; decrease "white" foods as pasta, rice, bread and desserts, sugar; Avoid red meat (limiting) palm and coconut oils; sugary foods and beverages  Two nutrients that raise blood chol levels are saturated fats and trans fat; in hydrogenated oils and fats, as stick margarine, baked goods (cookes, cakes, pies, crackers; frosting; and coffee creamers;   Some Fats lower cholesterol: Monounsaturated and polyunsaturated  Avocados Corn, sunflower, and soybean oils Nuts and seeds, such as walnuts Olive, canola, peanut, safflower, and sesame oils Peanut butter Salmon and trout Tofu          Fall Risk Fall Risk  02/22/2017 01/20/2016 05/05/2015 09/03/2013  Falls in the past year? No No Yes Yes  Number falls in past yr: - - 1 1  Injury with Fall? - - Yes Yes     Depression Screen PHQ 2/9 Scores 02/22/2017 01/20/2016 09/03/2013  PHQ - 2 Score 0 0 0    No issues  Cognitive Function MMSE - Mini Mental State Exam 02/22/2017  Not completed: (No Data)     Ad8 score reviewed for issues:  Issues making decisions:  Less interest in hobbies / activities:  Repeats questions, stories (family complaining):  Trouble using ordinary gadgets (microwave, computer, phone):  Forgets the month or year:   Mismanaging finances:   Remembering appts:  Daily problems with thinking and/or memory: Ad8 score is=0  Immunization History  Administered Date(s) Administered  . Influenza Split 04/09/2012  . Influenza Whole 05/01/2007, 04/06/2008, 04/21/2009, 03/24/2010, 04/10/2011  . Influenza, High Dose Seasonal PF 03/17/2016, 04/16/2017  . Influenza,inj,Quad PF,6+ Mos 03/03/2013  . Influenza-Unspecified 04/09/2014, 04/09/2015  . Pneumococcal  Conjugate-13 05/05/2015  . Pneumococcal Polysaccharide-23 04/09/2004  . Td 12/08/2005  . Tdap 12/22/2014  . Zoster 01/06/2008  . Zoster Recombinat (Shingrix) 08/01/2017, 10/03/2017     Screening Tests Health Maintenance  Topic Date Due  . INFLUENZA VACCINE  02/07/2018  . TETANUS/TDAP  12/21/2024  . DEXA SCAN  Completed  . PNA vac Low Risk Adult  Completed      Plan:      PCP Note   Health Maintenance Completed shingrix vaccines Up to date health care with recent mammogram 10/2017  Abnormal Screens  none  Referrals  none  Patient concerns; None  Doing correct therapy for her left ankle per Dr. Tonita Cong  Doing strength training and weights  Met her goal of < 190 to 188 today. A1c dropped 5.9 from 6.4   Nurse Concerns; As noted  Next PCP apt 04/08/2018       I have personally reviewed and noted the following in the patient's chart:   . Medical and social history . Use of alcohol, tobacco or illicit drugs  . Current medications and supplements . Functional ability and status . Nutritional status . Physical activity . Advanced directives . List of other physicians . Hospitalizations, surgeries, and ER visits in previous 12 months . Vitals . Screenings to include cognitive, depression, and falls . Referrals and appointments  In addition, I have reviewed and discussed with patient certain preventive protocols, quality metrics, and best practice recommendations. A written personalized care plan for preventive services as well as general preventive health recommendations were provided to patient.     Wynetta Fines, RN  02/26/2018

## 2018-02-26 ENCOUNTER — Ambulatory Visit (INDEPENDENT_AMBULATORY_CARE_PROVIDER_SITE_OTHER): Payer: Medicare HMO

## 2018-02-26 VITALS — BP 122/62 | HR 71 | Ht 65.0 in | Wt 188.0 lb

## 2018-02-26 DIAGNOSIS — Z0000 Encounter for general adult medical examination without abnormal findings: Secondary | ICD-10-CM

## 2018-02-26 NOTE — Progress Notes (Signed)
Hannah R Kim, DO  

## 2018-02-26 NOTE — Patient Instructions (Addendum)
Sheila Shields , Thank you for taking time to come for your Medicare Wellness Visit. I appreciate your ongoing commitment to your health goals. Please review the following plan we discussed and let me know if I can assist you in the future.   Enjoy your puppy!   Keep exercising !    These are the goals we discussed: Goals    . Patient Stated     Prepare for a new puppy !     . Weight (lb) < 190 lb (86.2 kg)     Will watch sweet intake- come up w a plan  Will consider cutting portions out    Fat free or low fat dairy products Fish high in omega-3 acids ( salmon, tuna, trout) Fruits, such as apples, bananas, oranges, pears, prunes Legumes, such as kidney beans, lentils, checkpeas, black-eyed peas and lima beans Vegetables; broccoli, cabbage, carrots Whole grains;   Plant fats are better; decrease "white" foods as pasta, rice, bread and desserts, sugar; Avoid red meat (limiting) palm and coconut oils; sugary foods and beverages  Two nutrients that raise blood chol levels are saturated fats and trans fat; in hydrogenated oils and fats, as stick margarine, baked goods (cookes, cakes, pies, crackers; frosting; and coffee creamers;   Some Fats lower cholesterol: Monounsaturated and polyunsaturated  Avocados Corn, sunflower, and soybean oils Nuts and seeds, such as walnuts Olive, canola, peanut, safflower, and sesame oils Peanut butter Salmon and trout Tofu          This is a list of the screening recommended for you and due dates:  Health Maintenance  Topic Date Due  . Flu Shot  02/07/2018  . Tetanus Vaccine  12/21/2024  . DEXA scan (bone density measurement)  Completed  . Pneumonia vaccines  Completed      Fall Prevention in the Home Falls can cause injuries. They can happen to people of all ages. There are many things you can do to make your home safe and to help prevent falls. What can I do on the outside of my home?  Regularly fix the edges of walkways and driveways  and fix any cracks.  Remove anything that might make you trip as you walk through a door, such as a raised step or threshold.  Trim any bushes or trees on the path to your home.  Use bright outdoor lighting.  Clear any walking paths of anything that might make someone trip, such as rocks or tools.  Regularly check to see if handrails are loose or broken. Make sure that both sides of any steps have handrails.  Any raised decks and porches should have guardrails on the edges.  Have any leaves, snow, or ice cleared regularly.  Use sand or salt on walking paths during winter.  Clean up any spills in your garage right away. This includes oil or grease spills. What can I do in the bathroom?  Use night lights.  Install grab bars by the toilet and in the tub and shower. Do not use towel bars as grab bars.  Use non-skid mats or decals in the tub or shower.  If you need to sit down in the shower, use a plastic, non-slip stool.  Keep the floor dry. Clean up any water that spills on the floor as soon as it happens.  Remove soap buildup in the tub or shower regularly.  Attach bath mats securely with double-sided non-slip rug tape.  Do not have throw rugs and other things on the  floor that can make you trip. What can I do in the bedroom?  Use night lights.  Make sure that you have a light by your bed that is easy to reach.  Do not use any sheets or blankets that are too big for your bed. They should not hang down onto the floor.  Have a firm chair that has side arms. You can use this for support while you get dressed.  Do not have throw rugs and other things on the floor that can make you trip. What can I do in the kitchen?  Clean up any spills right away.  Avoid walking on wet floors.  Keep items that you use a lot in easy-to-reach places.  If you need to reach something above you, use a strong step stool that has a grab bar.  Keep electrical cords out of the way.  Do  not use floor polish or wax that makes floors slippery. If you must use wax, use non-skid floor wax.  Do not have throw rugs and other things on the floor that can make you trip. What can I do with my stairs?  Do not leave any items on the stairs.  Make sure that there are handrails on both sides of the stairs and use them. Fix handrails that are broken or loose. Make sure that handrails are as long as the stairways.  Check any carpeting to make sure that it is firmly attached to the stairs. Fix any carpet that is loose or worn.  Avoid having throw rugs at the top or bottom of the stairs. If you do have throw rugs, attach them to the floor with carpet tape.  Make sure that you have a light switch at the top of the stairs and the bottom of the stairs. If you do not have them, ask someone to add them for you. What else can I do to help prevent falls?  Wear shoes that: ? Do not have high heels. ? Have rubber bottoms. ? Are comfortable and fit you well. ? Are closed at the toe. Do not wear sandals.  If you use a stepladder: ? Make sure that it is fully opened. Do not climb a closed stepladder. ? Make sure that both sides of the stepladder are locked into place. ? Ask someone to hold it for you, if possible.  Clearly mark and make sure that you can see: ? Any grab bars or handrails. ? First and last steps. ? Where the edge of each step is.  Use tools that help you move around (mobility aids) if they are needed. These include: ? Canes. ? Walkers. ? Scooters. ? Crutches.  Turn on the lights when you go into a dark area. Replace any light bulbs as soon as they burn out.  Set up your furniture so you have a clear path. Avoid moving your furniture around.  If any of your floors are uneven, fix them.  If there are any pets around you, be aware of where they are.  Review your medicines with your doctor. Some medicines can make you feel dizzy. This can increase your chance of  falling. Ask your doctor what other things that you can do to help prevent falls. This information is not intended to replace advice given to you by your health care provider. Make sure you discuss any questions you have with your health care provider. Document Released: 04/22/2009 Document Revised: 12/02/2015 Document Reviewed: 07/31/2014 Elsevier Interactive Patient Education  2018  [place] Maintenance, Female Adopting a healthy lifestyle and getting preventive care can go a long way to promote health and wellness. Talk with your health care provider about what schedule of regular examinations is right for you. This is a good chance for you to check in with your provider about disease prevention and staying healthy. In between checkups, there are plenty of things you can do on your own. Experts have done a lot of research about which lifestyle changes and preventive measures are most likely to keep you healthy. Ask your health care provider for more information. Weight and diet Eat a healthy diet  Be sure to include plenty of vegetables, fruits, low-fat dairy products, and lean protein.  Do not eat a lot of foods high in solid fats, added sugars, or salt.  Get regular exercise. This is one of the most important things you can do for your health. ? Most adults should exercise for at least 150 minutes each week. The exercise should increase your heart rate and make you sweat (moderate-intensity exercise). ? Most adults should also do strengthening exercises at least twice a week. This is in addition to the moderate-intensity exercise.  Maintain a healthy weight  Body mass index (BMI) is a measurement that can be used to identify possible weight problems. It estimates body fat based on height and weight. Your health care provider can help determine your BMI and help you achieve or maintain a healthy weight.  For females 36 years of age and older: ? A BMI below 18.5 is considered  underweight. ? A BMI of 18.5 to 24.9 is normal. ? A BMI of 25 to 29.9 is considered overweight. ? A BMI of 30 and above is considered obese.  Watch levels of cholesterol and blood lipids  You should start having your blood tested for lipids and cholesterol at 81 years of age, then have this test every 5 years.  You may need to have your cholesterol levels checked more often if: ? Your lipid or cholesterol levels are high. ? You are older than 81 years of age. ? You are at high risk for heart disease.  Cancer screening Lung Cancer  Lung cancer screening is recommended for adults 79-85 years old who are at high risk for lung cancer because of a history of smoking.  A yearly low-dose CT scan of the lungs is recommended for people who: ? Currently smoke. ? Have quit within the past 15 years. ? Have at least a 30-pack-year history of smoking. A pack year is smoking an average of one pack of cigarettes a day for 1 year.  Yearly screening should continue until it has been 15 years since you quit.  Yearly screening should stop if you develop a health problem that would prevent you from having lung cancer treatment.  Breast Cancer  Practice breast self-awareness. This means understanding how your breasts normally appear and feel.  It also means doing regular breast self-exams. Let your health care provider know about any changes, no matter how small.  If you are in your 20s or 30s, you should have a clinical breast exam (CBE) by a health care provider every 1-3 years as part of a regular health exam.  If you are 63 or older, have a CBE every year. Also consider having a breast X-ray (mammogram) every year.  If you have a family history of breast cancer, talk to your health care provider about genetic screening.  If you are  at high risk for breast cancer, talk to your health care provider about having an MRI and a mammogram every year.  Breast cancer gene (BRCA) assessment is  recommended for women who have family members with BRCA-related cancers. BRCA-related cancers include: ? Breast. ? Ovarian. ? Tubal. ? Peritoneal cancers.  Results of the assessment will determine the need for genetic counseling and BRCA1 and BRCA2 testing.  Cervical Cancer Your health care provider may recommend that you be screened regularly for cancer of the pelvic organs (ovaries, uterus, and vagina). This screening involves a pelvic examination, including checking for microscopic changes to the surface of your cervix (Pap test). You may be encouraged to have this screening done every 3 years, beginning at age 72.  For women ages 74-65, health care providers may recommend pelvic exams and Pap testing every 3 years, or they may recommend the Pap and pelvic exam, combined with testing for human papilloma virus (HPV), every 5 years. Some types of HPV increase your risk of cervical cancer. Testing for HPV may also be done on women of any age with unclear Pap test results.  Other health care providers may not recommend any screening for nonpregnant women who are considered low risk for pelvic cancer and who do not have symptoms. Ask your health care provider if a screening pelvic exam is right for you.  If you have had past treatment for cervical cancer or a condition that could lead to cancer, you need Pap tests and screening for cancer for at least 20 years after your treatment. If Pap tests have been discontinued, your risk factors (such as having a new sexual partner) need to be reassessed to determine if screening should resume. Some women have medical problems that increase the chance of getting cervical cancer. In these cases, your health care provider may recommend more frequent screening and Pap tests.  Colorectal Cancer  This type of cancer can be detected and often prevented.  Routine colorectal cancer screening usually begins at 81 years of age and continues through 81 years of  age.  Your health care provider may recommend screening at an earlier age if you have risk factors for colon cancer.  Your health care provider may also recommend using home test kits to check for hidden blood in the stool.  A small camera at the end of a tube can be used to examine your colon directly (sigmoidoscopy or colonoscopy). This is done to check for the earliest forms of colorectal cancer.  Routine screening usually begins at age 73.  Direct examination of the colon should be repeated every 5-10 years through 81 years of age. However, you may need to be screened more often if early forms of precancerous polyps or small growths are found.  Skin Cancer  Check your skin from head to toe regularly.  Tell your health care provider about any new moles or changes in moles, especially if there is a change in a mole's shape or color.  Also tell your health care provider if you have a mole that is larger than the size of a pencil eraser.  Always use sunscreen. Apply sunscreen liberally and repeatedly throughout the day.  Protect yourself by wearing long sleeves, pants, a wide-brimmed hat, and sunglasses whenever you are outside.  Heart disease, diabetes, and high blood pressure  High blood pressure causes heart disease and increases the risk of stroke. High blood pressure is more likely to develop in: ? People who have blood pressure in the  high end of the normal range (130-139/85-89 mm Hg). ? People who are overweight or obese. ? People who are African American.  If you are 59-6 years of age, have your blood pressure checked every 3-5 years. If you are 66 years of age or older, have your blood pressure checked every year. You should have your blood pressure measured twice-once when you are at a hospital or clinic, and once when you are not at a hospital or clinic. Record the average of the two measurements. To check your blood pressure when you are not at a hospital or clinic, you  can use: ? An automated blood pressure machine at a pharmacy. ? A home blood pressure monitor.  If you are between 39 years and 40 years old, ask your health care provider if you should take aspirin to prevent strokes.  Have regular diabetes screenings. This involves taking a blood sample to check your fasting blood sugar level. ? If you are at a normal weight and have a low risk for diabetes, have this test once every three years after 81 years of age. ? If you are overweight and have a high risk for diabetes, consider being tested at a younger age or more often. Preventing infection Hepatitis B  If you have a higher risk for hepatitis B, you should be screened for this virus. You are considered at high risk for hepatitis B if: ? You were born in a country where hepatitis B is common. Ask your health care provider which countries are considered high risk. ? Your parents were born in a high-risk country, and you have not been immunized against hepatitis B (hepatitis B vaccine). ? You have HIV or AIDS. ? You use needles to inject street drugs. ? You live with someone who has hepatitis B. ? You have had sex with someone who has hepatitis B. ? You get hemodialysis treatment. ? You take certain medicines for conditions, including cancer, organ transplantation, and autoimmune conditions.  Hepatitis C  Blood testing is recommended for: ? Everyone born from [birth year] through 1963. ? Anyone with known risk factors for hepatitis C.  Sexually transmitted infections (STIs)  You should be screened for sexually transmitted infections (STIs) including gonorrhea and chlamydia if: ? You are sexually active and are younger than 81 years of age. ? You are older than 81 years of age and your health care provider tells you that you are at risk for this type of infection. ? Your sexual activity has changed since you were last screened and you are at an increased risk for chlamydia or gonorrhea. Ask your  health care provider if you are at risk.  If you do not have HIV, but are at risk, it may be recommended that you take a prescription medicine daily to prevent HIV infection. This is called pre-exposure prophylaxis (PrEP). You are considered at risk if: ? You are sexually active and do not regularly use condoms or know the HIV status of your partner(s). ? You take drugs by injection. ? You are sexually active with a partner who has HIV.  Talk with your health care provider about whether you are at high risk of being infected with HIV. If you choose to begin PrEP, you should first be tested for HIV. You should then be tested every 3 months for as long as you are taking PrEP. Pregnancy  If you are premenopausal and you may become pregnant, ask your health care provider about preconception counseling.  If  you may become pregnant, take 400 to 800 micrograms (mcg) of folic acid every day.  If you want to prevent pregnancy, talk to your health care provider about birth control (contraception). Osteoporosis and menopause  Osteoporosis is a disease in which the bones lose minerals and strength with aging. This can result in serious bone fractures. Your risk for osteoporosis can be identified using a bone density scan.  If you are 38 years of age or older, or if you are at risk for osteoporosis and fractures, ask your health care provider if you should be screened.  Ask your health care provider whether you should take a calcium or vitamin D supplement to lower your risk for osteoporosis.  Menopause may have certain physical symptoms and risks.  Hormone replacement therapy may reduce some of these symptoms and risks. Talk to your health care provider about whether hormone replacement therapy is right for you. Follow these instructions at home:  Schedule regular health, dental, and eye exams.  Stay current with your immunizations.  Do not use any tobacco products including cigarettes, chewing  tobacco, or electronic cigarettes.  If you are pregnant, do not drink alcohol.  If you are breastfeeding, limit how much and how often you drink alcohol.  Limit alcohol intake to no more than 1 drink per day for nonpregnant women. One drink equals 12 ounces of beer, 5 ounces of wine, or 1 ounces of hard liquor.  Do not use street drugs.  Do not share needles.  Ask your health care provider for help if you need support or information about quitting drugs.  Tell your health care provider if you often feel depressed.  Tell your health care provider if you have ever been abused or do not feel safe at home. This information is not intended to replace advice given to you by your health care provider. Make sure you discuss any questions you have with your health care provider. Document Released: 01/09/2011 Document Revised: 12/02/2015 Document Reviewed: 03/30/2015 Elsevier Interactive Patient Education  [employer].

## 2018-03-20 DIAGNOSIS — R69 Illness, unspecified: Secondary | ICD-10-CM | POA: Diagnosis not present

## 2018-03-24 ENCOUNTER — Other Ambulatory Visit: Payer: Self-pay | Admitting: Family Medicine

## 2018-04-02 NOTE — Progress Notes (Addendum)
HPI:  Using dictation device. Unfortunately this device frequently misinterprets words/phrases.  Sheila Shields is a pleasant 83 y.o. here for follow up. Chronic medical problems summarized below were reviewed for changes and stability and were updated as needed below. These issues and their treatment remain stable for the most part. Patient reports doing well. She is seeing her orthopedic specialist about L ankle pain. Is excited to be getting a new puppy and feels this will help with exercise. Was at the beach this past week and admits to eating the wrong things and is a bit unhappy that she gained some weight. Denies CP, SOB, DOE, treatment intolerance or new symptoms. Wonders about need for asa.  Due for labs AWV was 02/26/18  HTN: -meds: asa, losartan-hctz 100-12.5in the past -now hctz alone due to pt preference and less need with wt reduction  GERD: -Pantoprazole 40mg   HLD/Obesity/Hyperglycemia: -meds: simvastatin, fish oil -doing Loose it App on phone and silver sneakers (04/2017) -wt 207 2/18 --> 195 (10/18) --> 191 (2/19) --> 195.2  Hx of Anemia, chronic: -dx and eval with prior PCP per report -takes iron daily  OA/chronic back pain.R ankle injury: -sees ortho - Dr. Maxie Better and Dr. Nelva Bush -take tylenol most days  COPD/Chronic Cough/Allergies: -seeing pulm  Vertigo: -See office visit notes 08/06/2017 -Presumed BPPV along with sinus issues  ROS: See pertinent positives and negatives per HPI.  Past Medical History:  Diagnosis Date  . Alcoholism in remission ([place]) 08/27/2007  . Anemia   . Arthritis    OA  . COPD 12/17/2006  . Diverticulosis 03/27/2007  . GERD 05/01/2007  . HYPERGLYCEMIA 08/27/2007  . HYPERLIPIDEMIA 12/17/2006  . HYPERTENSION 12/17/2006  . Mood disorder (HCC)    hx anxiety and depression  . Rectal polyp 03/27/2007   adenoma    Past Surgical History:  Procedure Laterality Date  . ABDOMINAL HYSTERECTOMY  1978   fibroma  . APPENDECTOMY  2002  .  BREAST BIOPSY     negative  . BREAST EXCISIONAL BIOPSY Right    2003  . CARPAL TUNNEL RELEASE Left 2010 or 2011  . COLONOSCOPY W/ POLYPECTOMY  03/27/2007; n7/19/2012   2008: 4 mm adenoma, diverticulosis 2012: ileitis ? NSAID - likely, 2-3 mm cecal polyp LYMPHOID FOLLICLE, diverticulosis  . ESOPHAGOGASTRODUODENOSCOPY  01/26/2011   reflux esophagitis, colonscopy done also  . HAMMER TOE SURGERY  2008   left foot  . HERNIA REPAIR Right 1996?  . ORIF ANKLE FRACTURE Right 12/22/2014   Procedure: OPEN REDUCTION INTERNAL FIXATION (ORIF) ANKLE FRACTURE;  Surgeon: Susa Day, MD;  Location: [facility];  Service: Orthopedics;  Laterality: Right;  . SHOULDER OPEN ROTATOR CUFF REPAIR Left 03/06/2013   Procedure: LEFT ROTATOR CUFF REPAIR, SUBACROMIAL DECOMPRESSION, PATCH GRAFT, MANIPULATION UNDER ANESTHESIA;  Surgeon: Johnn Hai, MD;  Location: [facility];  Service: Orthopedics;  Laterality: Left;    Family History  Problem Relation Age of Onset  . Throat cancer Mother   . Heart attack Father 43  . Idiopathic pulmonary fibrosis Brother 18  . Cancer Brother        sarcoma  . Heart disease Brother   . Colon cancer Neg Hx   . Esophageal cancer Neg Hx   . Stomach cancer Neg Hx     SOCIAL HX: see hpi   Current Outpatient Medications:  .  acetaminophen ([employer] ARTHRITIS PAIN RELIEF) 650 MG CR tablet, Take 1,300 mg by mouth 2 (two) times daily., Disp: , Rfl:  .  albuterol (PROAIR HFA)  108 (90 Base) MCG/ACT inhaler, INHALE 2 PUFFS EVERY 4 HOURS AS NEEDED FOR COUGHING SPELLS, Disp: 8.5 Inhaler, Rfl: 2 .  aspirin 81 MG tablet, Take 81 mg by mouth daily., Disp: , Rfl:  .  Coenzyme Q10 (COQ10 PO), Take by mouth daily., Disp: , Rfl:  .  ferrous sulfate 325 (65 FE) MG tablet, TAKE 1 TABLET BY MOUTH EVERY DAY, Disp: 90 tablet, Rfl: 3 .  fluticasone (FLONASE) 50 MCG/ACT nasal spray, Place 2 sprays into both nostrils daily., Disp: 16 g, Rfl: 5 .  hydrochlorothiazide (HYDRODIURIL) 25 MG tablet, Take 1 tablet (25  mg total) by mouth daily., Disp: 90 tablet, Rfl: 3 .  loratadine (CLARITIN) 10 MG tablet, Take 1 tablet (10 mg total) by mouth daily. (Patient taking differently: Take 10 mg by mouth as needed. ), Disp: 90 tablet, Rfl: 3 .  meclizine (ANTIVERT) 25 MG tablet, Take 1 tablet (25 mg total) by mouth 3 (three) times daily as needed for dizziness., Disp: 30 tablet, Rfl: 0 .  Multiple Vitamin (MULTIVITAMIN WITH MINERALS) TABS tablet, Take 1 tablet by mouth daily., Disp: , Rfl:  .  pantoprazole (PROTONIX) 40 MG tablet, Take 1 tablet (40 mg total) by mouth daily., Disp: 90 tablet, Rfl: 3 .  potassium chloride SA (K-DUR,KLOR-CON) 20 MEQ tablet, TAKE 1 TABLET BY MOUTH DAILY, Disp: 90 tablet, Rfl: 1 .  predniSONE (DELTASONE) 20 MG tablet, Take 2 tablets (40 mg total) by mouth daily with breakfast., Disp: 8 tablet, Rfl: 0 .  ranitidine (ZANTAC) 300 MG tablet, Take 1 tablet (300 mg total) by mouth at bedtime., Disp: 90 tablet, Rfl: 3 .  simvastatin (ZOCOR) 20 MG tablet, TAKE ONE TABLET BY MOUTH EVERY MORNING, Disp: 90 tablet, Rfl: 2 .  Vitamin D, Cholecalciferol, 1000 units CAPS, Take 1 capsule by mouth daily., Disp: , Rfl:   EXAM:  Vitals:   04/08/18 0805  BP: 118/70  Pulse: 77  Temp: 97.8 F (36.6 C)    Body mass index is 32.48 kg/m.  GENERAL: vitals reviewed and listed above, alert, oriented, appears well hydrated and in no acute distress  HEENT: atraumatic, conjunttiva clear, no obvious abnormalities on inspection of external nose and ears  NECK: no obvious masses on inspection  LUNGS: clear to auscultation bilaterally, no wheezes, rales or rhonchi, good air movement  CV: HRRR, no peripheral edema  MS: moves all extremities without noticeable abnormality  PSYCH: pleasant and cooperative, no obvious depression or anxiety  ASSESSMENT AND PLAN:  Discussed the following assessment and plan:  Essential hypertension - Plan: Basic metabolic panel, CBC  HYPERGLYCEMIA  Hyperlipidemia,  unspecified hyperlipidemia type  BMI 32.0-32.9,adult  Aortic atherosclerosis (HCC)  -reports doing well -labs today -healthy diet and regular exercise as able - summary in handout -cont asa -follow up 3-4 months   Patient Instructions  BEFORE YOU LEAVE: -labs -follow up: 3-4 months  We have ordered labs or studies at this visit. It can take up to 1-2 weeks for results and processing. IF results require follow up or explanation, we will call you with instructions. Clinically stable results will be released to your [facility]. If you have not heard from [place] or cannot find your results in [facility] in 2 weeks please contact our office at [phone].  If you are not yet signed up for [facility], please consider signing up.    We recommend the following healthy lifestyle for LIFE: 1) Small portions. But, make sure to get regular (at least 3 per day), healthy meals  and small healthy snacks if needed.  2) Eat a healthy clean diet.   TRY TO EAT: -at least 5-7 servings of low sugar, colorful, and nutrient rich vegetables per day (not corn, potatoes or bananas.) -berries are the best choice if you wish to eat fruit (only eat small amounts if trying to reduce weight)  -lean meets (fish, white meat of chicken or [place]) -vegan proteins for some meals - beans or tofu, whole grains, nuts and seeds -Replace bad fats with good fats - good fats include: fish, nuts and seeds, canola oil, olive oil -small amounts of low fat or non fat dairy -small amounts of100 % whole grains - check the lables -drink plenty of water  AVOID: -SUGAR, sweets, anything with added sugar, corn syrup or sweeteners - must read labels as even foods advertised as "healthy" often are loaded with sugar -if you must have a sweetener, small amounts of stevia may be best -sweetened beverages and artificially sweetened beverages -simple starches (rice, bread, potatoes, pasta, chips, etc - small amounts of 100% whole grains are  ok) -red meat, pork, butter -fried foods, fast food, processed food, excessive dairy, eggs and coconut.  3)Get at least 150 minutes of sweaty aerobic exercise per week.  4)Reduce stress - consider counseling, meditation and relaxation to balance other aspects of your life.         Lucretia Kern, DO

## 2018-04-08 ENCOUNTER — Ambulatory Visit (INDEPENDENT_AMBULATORY_CARE_PROVIDER_SITE_OTHER): Payer: Medicare HMO | Admitting: Family Medicine

## 2018-04-08 ENCOUNTER — Encounter: Payer: Self-pay | Admitting: Family Medicine

## 2018-04-08 VITALS — BP 118/70 | HR 77 | Temp 97.8°F | Ht 65.0 in | Wt 195.2 lb

## 2018-04-08 DIAGNOSIS — R7309 Other abnormal glucose: Secondary | ICD-10-CM

## 2018-04-08 DIAGNOSIS — E785 Hyperlipidemia, unspecified: Secondary | ICD-10-CM

## 2018-04-08 DIAGNOSIS — I10 Essential (primary) hypertension: Secondary | ICD-10-CM

## 2018-04-08 DIAGNOSIS — Z6832 Body mass index (BMI) 32.0-32.9, adult: Secondary | ICD-10-CM | POA: Diagnosis not present

## 2018-04-08 DIAGNOSIS — I700 Atherosclerosis of aorta: Secondary | ICD-10-CM | POA: Diagnosis not present

## 2018-04-08 LAB — BASIC METABOLIC PANEL
BUN: 22 mg/dL (ref 6–23)
CALCIUM: 9.7 mg/dL (ref 8.4–10.5)
CO2: 28 meq/L (ref 19–32)
CREATININE: 0.72 mg/dL (ref 0.40–1.20)
Chloride: 108 mEq/L (ref 96–112)
GFR: 82.59 mL/min (ref >60.00)
GLUCOSE: 103 mg/dL — AB (ref 70–99)
Potassium: 4.4 mEq/L (ref 3.5–5.1)
Sodium: 145 mEq/L (ref 135–145)

## 2018-04-08 LAB — CBC
HCT: 42 % (ref 36.0–46.0)
Hemoglobin: 14.4 g/dL (ref 12.0–15.0)
MCHC: 34.2 g/dL (ref 30.0–36.0)
MCV: 91.5 fl (ref 78.0–100.0)
PLATELETS: 257 10*3/uL (ref 150.0–400.0)
RBC: 4.59 Mil/uL (ref 3.87–5.11)
RDW: 13.5 % (ref 11.5–15.5)
WBC: 6.6 10*3/uL (ref 4.0–10.5)

## 2018-04-08 NOTE — Patient Instructions (Addendum)
BEFORE YOU LEAVE: -labs -follow up: 3-4 months  We have ordered labs or studies at this visit. It can take up to 1-2 weeks for results and processing. IF results require follow up or explanation, we will call you with instructions. Clinically stable results will be released to your [facility]. If you have not heard from [place] or cannot find your results in [facility] in 2 weeks please contact our office at [phone].  If you are not yet signed up for [facility], please consider signing up.    We recommend the following healthy lifestyle for LIFE: 1) Small portions. But, make sure to get regular (at least 3 per day), healthy meals and small healthy snacks if needed.  2) Eat a healthy clean diet.   TRY TO EAT: -at least 5-7 servings of low sugar, colorful, and nutrient rich vegetables per day (not corn, potatoes or bananas.) -berries are the best choice if you wish to eat fruit (only eat small amounts if trying to reduce weight)  -lean meets (fish, white meat of chicken or [place]) -vegan proteins for some meals - beans or tofu, whole grains, nuts and seeds -Replace bad fats with good fats - good fats include: fish, nuts and seeds, canola oil, olive oil -small amounts of low fat or non fat dairy -small amounts of100 % whole grains - check the lables -drink plenty of water  AVOID: -SUGAR, sweets, anything with added sugar, corn syrup or sweeteners - must read labels as even foods advertised as "healthy" often are loaded with sugar -if you must have a sweetener, small amounts of stevia may be best -sweetened beverages and artificially sweetened beverages -simple starches (rice, bread, potatoes, pasta, chips, etc - small amounts of 100% whole grains are ok) -red meat, pork, butter -fried foods, fast food, processed food, excessive dairy, eggs and coconut.  3)Get at least 150 minutes of sweaty aerobic exercise per week.  4)Reduce stress - consider counseling, meditation and relaxation to balance  other aspects of your life.

## 2018-04-10 ENCOUNTER — Encounter: Payer: Self-pay | Admitting: Family Medicine

## 2018-04-11 ENCOUNTER — Encounter: Payer: Self-pay | Admitting: Family Medicine

## 2018-04-11 DIAGNOSIS — I700 Atherosclerosis of aorta: Secondary | ICD-10-CM | POA: Insufficient documentation

## 2018-04-18 DIAGNOSIS — M19079 Primary osteoarthritis, unspecified ankle and foot: Secondary | ICD-10-CM | POA: Diagnosis not present

## 2018-04-18 DIAGNOSIS — M19071 Primary osteoarthritis, right ankle and foot: Secondary | ICD-10-CM | POA: Diagnosis not present

## 2018-04-18 DIAGNOSIS — M25571 Pain in right ankle and joints of right foot: Secondary | ICD-10-CM | POA: Diagnosis not present

## 2018-05-02 ENCOUNTER — Other Ambulatory Visit: Payer: Self-pay | Admitting: Family Medicine

## 2018-05-20 DIAGNOSIS — J300 Vasomotor rhinitis: Secondary | ICD-10-CM | POA: Diagnosis not present

## 2018-05-20 DIAGNOSIS — R05 Cough: Secondary | ICD-10-CM | POA: Diagnosis not present

## 2018-06-27 DIAGNOSIS — R69 Illness, unspecified: Secondary | ICD-10-CM | POA: Diagnosis not present

## 2018-06-28 ENCOUNTER — Encounter: Payer: Self-pay | Admitting: Family Medicine

## 2018-06-28 ENCOUNTER — Ambulatory Visit (INDEPENDENT_AMBULATORY_CARE_PROVIDER_SITE_OTHER): Payer: Medicare HMO | Admitting: Family Medicine

## 2018-06-28 VITALS — BP 120/78 | HR 74 | Temp 98.0°F | Wt 192.0 lb

## 2018-06-28 DIAGNOSIS — S61412A Laceration without foreign body of left hand, initial encounter: Secondary | ICD-10-CM | POA: Diagnosis not present

## 2018-06-28 NOTE — Progress Notes (Signed)
   Subjective:    Patient ID: Sheila Shields, female    DOB: [birth year], 83 y.o.   MRN: [id number]  HPI Here to check a wound on the left hand that appeared 10 days ago. Her puppy scratched her while playing. It looked good for awhile but a few days ago she knocked the scab off. Since then it has been sore and looks pink. Using Bacitracin.    Review of Systems  Constitutional: Negative.   Respiratory: Negative.   Cardiovascular: Negative.   Skin: Positive for wound.       Objective:   Physical Exam Constitutional:      Appearance: Normal appearance.  Cardiovascular:     Rate and Rhythm: Normal rate and regular rhythm.     Pulses: Normal pulses.     Heart sounds: Normal heart sounds.  Pulmonary:     Effort: Pulmonary effort is normal.     Breath sounds: Normal breath sounds.  Skin:    Comments: There is a superficial abrasion on the dorsal left hand. This looks clean   Neurological:     Mental Status: She is alert.           Assessment & Plan:  Laceration to the hand, this looks good and is healing well. Recheck prn.  Alysia Penna, MD

## 2018-07-15 ENCOUNTER — Ambulatory Visit (INDEPENDENT_AMBULATORY_CARE_PROVIDER_SITE_OTHER): Payer: Medicare HMO | Admitting: Family Medicine

## 2018-07-15 ENCOUNTER — Encounter: Payer: Self-pay | Admitting: Family Medicine

## 2018-07-15 VITALS — BP 110/58 | HR 66 | Temp 97.8°F | Ht 66.0 in | Wt 188.2 lb

## 2018-07-15 DIAGNOSIS — J441 Chronic obstructive pulmonary disease with (acute) exacerbation: Secondary | ICD-10-CM

## 2018-07-15 MED ORDER — FLUTICASONE PROPIONATE HFA 220 MCG/ACT IN AERO: 1.0000 | INHALATION_SPRAY | Freq: Two times a day (BID) | RESPIRATORY_TRACT | 12 refills | Status: DC

## 2018-07-15 MED ORDER — PREDNISONE 20 MG PO TABS: 40.0000 mg | ORAL_TABLET | Freq: Every day | ORAL | 0 refills | Status: DC

## 2018-07-15 NOTE — Progress Notes (Signed)
Sheila Shields is a 84 y.o. female here for an acute visit.  History of Present Illness:   Cough  This is a recurrent problem. The current episode started in the past 7 days. The problem has been gradually worsening. The cough is non-productive. Associated symptoms include nasal congestion, postnasal drip and wheezing. Pertinent negatives include no chest pain, chills, ear congestion, ear pain, fever, heartburn or shortness of breath. The symptoms are aggravated by lying down and stress. She has tried a beta-agonist inhaler, OTC cough suppressant and rest for the symptoms. The treatment provided mild relief. Her past medical history is significant for COPD.    PMHx, SurgHx, SocialHx, Medications, and Allergies were reviewed in the Visit Navigator and updated as appropriate.  Current Medications:   Current Outpatient Medications:  .  acetaminophen ([employer]S ARTHRITIS PAIN RELIEF) 650 MG CR tablet, Take 1,300 mg by mouth 2 (two) times daily., Disp: , Rfl:  .  albuterol (PROAIR HFA) 108 (90 Base) MCG/ACT inhaler, INHALE 2 PUFFS EVERY 4 HOURS AS NEEDED FOR COUGHING SPELLS, Disp: 8.5 Inhaler, Rfl: 2 .  aspirin 81 MG tablet, Take 81 mg by mouth daily., Disp: , Rfl:  .  Coenzyme Q10 (COQ10 PO), Take by mouth daily., Disp: , Rfl:  .  ferrous sulfate 325 (65 FE) MG tablet, TAKE 1 TABLET BY MOUTH EVERY DAY, Disp: 90 tablet, Rfl: 3 .  fluticasone (FLONASE) 50 MCG/ACT nasal spray, Place 2 sprays into both nostrils daily., Disp: 16 g, Rfl: 5 .  hydrochlorothiazide (HYDRODIURIL) 25 MG tablet, TAKE ONE TABLET BY MOUTH DAILY, Disp: 90 tablet, Rfl: 1 .  loratadine (CLARITIN) 10 MG tablet, Take 1 tablet (10 mg total) by mouth daily. (Patient taking differently: Take 10 mg by mouth as needed. ), Disp: 90 tablet, Rfl: 3 .  meclizine (ANTIVERT) 25 MG tablet, Take 1 tablet (25 mg total) by mouth 3 (three) times daily as needed for dizziness., Disp: 30 tablet, Rfl: 0 .  Multiple Vitamin (MULTIVITAMIN WITH MINERALS)  TABS tablet, Take 1 tablet by mouth daily., Disp: , Rfl:  .  pantoprazole (PROTONIX) 40 MG tablet, Take 1 tablet (40 mg total) by mouth daily., Disp: 90 tablet, Rfl: 3 .  potassium chloride SA (K-DUR,KLOR-CON) 20 MEQ tablet, TAKE 1 TABLET BY MOUTH DAILY, Disp: 90 tablet, Rfl: 1 .  predniSONE (DELTASONE) 20 MG tablet, Take 2 tablets (40 mg total) by mouth daily with breakfast., Disp: 8 tablet, Rfl: 0 .  ranitidine (ZANTAC) 300 MG tablet, Take 1 tablet (300 mg total) by mouth at bedtime., Disp: 90 tablet, Rfl: 3 .  simvastatin (ZOCOR) 20 MG tablet, TAKE ONE TABLET BY MOUTH EVERY MORNING, Disp: 90 tablet, Rfl: 2 .  Vitamin D, Cholecalciferol, 1000 units CAPS, Take 1 capsule by mouth daily., Disp: , Rfl:    Allergies  Allergen Reactions  . Alcohol   . Codeine     Patient prefers not to take this due to history of alcoholism  . Dilaudid [Hydromorphone Hcl]     Per patient she was told she had a reaction after surgery-reaction unknown-jaf   Review of Systems:   Pertinent items are noted in the HPI. Otherwise, ROS is negative.  Vitals:   Vitals:   07/15/18 1054  BP: (!) 110/58  Pulse: 66  Temp: 97.8 F (36.6 C)  TempSrc: Oral  SpO2: 93%  Weight: 188 lb 3.2 oz (85.4 kg)  Height: 5\' 6"  (1.676 m)     Body mass index is 30.38 kg/m.  Physical  Exam:   Physical Exam Vitals signs and nursing note reviewed.  HENT:     Head: Normocephalic and atraumatic.  Eyes:     Pupils: Pupils are equal, round, and reactive to light.  Neck:     Musculoskeletal: Normal range of motion and neck supple.  Cardiovascular:     Rate and Rhythm: Normal rate and regular rhythm.     Heart sounds: Normal heart sounds.  Pulmonary:     Effort: No respiratory distress.     Breath sounds: Decreased air movement present. Wheezing present.  Abdominal:     Palpations: Abdomen is soft.  Skin:    General: Skin is warm.  Psychiatric:        Behavior: Behavior normal.    Assessment and Plan:   Diagnoses and  all orders for this visit:  COPD with exacerbation ([place]) -     predniSONE (DELTASONE) 20 MG tablet; Take 2 tablets (40 mg total) by mouth daily with breakfast. -     fluticasone (FLOVENT HFA) 220 MCG/ACT inhaler; Inhale 1 puff into the lungs 2 (two) times daily. -     PR IPRATROPIUM BROMIDE COMP   . Reviewed expectations re: course of current medical issues. . Discussed self-management of symptoms. . Outlined signs and symptoms indicating need for more acute intervention. . Patient verbalized understanding and all questions were answered. Marland Kitchen Health Maintenance issues including appropriate healthy diet, exercise, and smoking avoidance were discussed with patient. . See orders for this visit as documented in the electronic medical record. . Patient received an After Visit Summary.  Briscoe Deutscher, DO [place], [place] 07/15/2018

## 2018-07-15 NOTE — Progress Notes (Signed)
Acute Office Visit  Subjective:    Patient ID: Sheila Shields, female    DOB: [birth year], 84 y.o.   MRN: [id number]  No chief complaint on file.   HPI Patient is in today for Cough, "stiffness in back" and chest pain. Pt stated it feels like a "orangutan is sitting on her chest". Pt does have a hx of COPD. Sx started 07/03/2019 pt has tried Robitussin but denies relief.   Past Medical History:  Diagnosis Date  . Alcoholism in remission ([place]) 08/27/2007  . Anemia   . Arthritis    OA  . COPD 12/17/2006  . Diverticulosis 03/27/2007  . GERD 05/01/2007  . HYPERGLYCEMIA 08/27/2007  . HYPERLIPIDEMIA 12/17/2006  . HYPERTENSION 12/17/2006  . Mood disorder (HCC)    hx anxiety and depression  . Rectal polyp 03/27/2007   adenoma    Past Surgical History:  Procedure Laterality Date  . ABDOMINAL HYSTERECTOMY  1978   fibroma  . APPENDECTOMY  2002  . BREAST BIOPSY     negative  . BREAST EXCISIONAL BIOPSY Right    2003  . CARPAL TUNNEL RELEASE Left 2010 or 2011  . COLONOSCOPY W/ POLYPECTOMY  03/27/2007; n7/19/2012   2008: 4 mm adenoma, diverticulosis 2012: ileitis ? NSAID - likely, 2-3 mm cecal polyp LYMPHOID FOLLICLE, diverticulosis  . ESOPHAGOGASTRODUODENOSCOPY  01/26/2011   reflux esophagitis, colonscopy done also  . HAMMER TOE SURGERY  2008   left foot  . HERNIA REPAIR Right 1996?  . ORIF ANKLE FRACTURE Right 12/22/2014   Procedure: OPEN REDUCTION INTERNAL FIXATION (ORIF) ANKLE FRACTURE;  Surgeon: Susa Day, MD;  Location: [facility];  Service: Orthopedics;  Laterality: Right;  . SHOULDER OPEN ROTATOR CUFF REPAIR Left 03/06/2013   Procedure: LEFT ROTATOR CUFF REPAIR, SUBACROMIAL DECOMPRESSION, PATCH GRAFT, MANIPULATION UNDER ANESTHESIA;  Surgeon: Johnn Hai, MD;  Location: [facility];  Service: Orthopedics;  Laterality: Left;    Family History  Problem Relation Age of Onset  . Throat cancer Mother   . Heart attack Father 48  . Idiopathic pulmonary fibrosis Brother 50  . Cancer  Brother        sarcoma  . Heart disease Brother   . Colon cancer Neg Hx   . Esophageal cancer Neg Hx   . Stomach cancer Neg Hx     Social History   Socioeconomic History  . Marital status: Married    Spouse name: Not on file  . Number of children: 2  . Years of education: Not on file  . Highest education level: Not on file  Occupational History  . Occupation: retired  Scientific laboratory technician  . Financial resource strain: Not on file  . Food insecurity:    Worry: Not on file    Inability: Not on file  . Transportation needs:    Medical: Not on file    Non-medical: Not on file  Tobacco Use  . Smoking status: Former Smoker    Packs/day: 2.00    Years: 30.00    Pack years: 60.00    Types: Cigarettes    Last attempt to quit: 07/10/1984    Years since quitting: 34.0  . Smokeless tobacco: Never Used  Substance and Sexual Activity  . Alcohol use: No    Alcohol/week: 0.0 standard drinks    Comment: no alcoho since 1983  . Drug use: No  . Sexual activity: Not on file  Lifestyle  . Physical activity:    Days per week: Not on file  Minutes per session: Not on file  . Stress: Not on file  Relationships  . Social connections:    Talks on phone: Not on file    Gets together: Not on file    Attends religious service: Not on file    Active member of club or organization: Not on file    Attends meetings of clubs or organizations: Not on file    Relationship status: Not on file  . Intimate partner violence:    Fear of current or ex partner: Not on file    Emotionally abused: Not on file    Physically abused: Not on file    Forced sexual activity: Not on file  Other Topics Concern  . Not on file  Social History Narrative   Work or School: very active in [place] Situation: lives with husband      Spiritual Beliefs: Christian      Lifestyle:tries to walk; diet ok - denies any alcohol or tobacco use in many many years       Outpatient Medications Prior to Visit    Medication Sig Dispense Refill  . acetaminophen ([employer]S ARTHRITIS PAIN RELIEF) 650 MG CR tablet Take 1,300 mg by mouth 2 (two) times daily.    Marland Kitchen albuterol (PROAIR HFA) 108 (90 Base) MCG/ACT inhaler INHALE 2 PUFFS EVERY 4 HOURS AS NEEDED FOR COUGHING SPELLS 8.5 Inhaler 2  . aspirin 81 MG tablet Take 81 mg by mouth daily.    . Coenzyme Q10 (COQ10 PO) Take by mouth daily.    . ferrous sulfate 325 (65 FE) MG tablet TAKE 1 TABLET BY MOUTH EVERY DAY 90 tablet 3  . fluticasone (FLONASE) 50 MCG/ACT nasal spray Place 2 sprays into both nostrils daily. 16 g 5  . hydrochlorothiazide (HYDRODIURIL) 25 MG tablet TAKE ONE TABLET BY MOUTH DAILY 90 tablet 1  . loratadine (CLARITIN) 10 MG tablet Take 1 tablet (10 mg total) by mouth daily. (Patient taking differently: Take 10 mg by mouth as needed. ) 90 tablet 3  . meclizine (ANTIVERT) 25 MG tablet Take 1 tablet (25 mg total) by mouth 3 (three) times daily as needed for dizziness. 30 tablet 0  . Multiple Vitamin (MULTIVITAMIN WITH MINERALS) TABS tablet Take 1 tablet by mouth daily.    . pantoprazole (PROTONIX) 40 MG tablet Take 1 tablet (40 mg total) by mouth daily. 90 tablet 3  . potassium chloride SA (K-DUR,KLOR-CON) 20 MEQ tablet TAKE 1 TABLET BY MOUTH DAILY 90 tablet 1  . predniSONE (DELTASONE) 20 MG tablet Take 2 tablets (40 mg total) by mouth daily with breakfast. 8 tablet 0  . ranitidine (ZANTAC) 300 MG tablet Take 1 tablet (300 mg total) by mouth at bedtime. 90 tablet 3  . simvastatin (ZOCOR) 20 MG tablet TAKE ONE TABLET BY MOUTH EVERY MORNING 90 tablet 2  . Vitamin D, Cholecalciferol, 1000 units CAPS Take 1 capsule by mouth daily.     No facility-administered medications prior to visit.     Allergies  Allergen Reactions  . Alcohol   . Codeine     Patient prefers not to take this due to history of alcoholism  . Dilaudid [Hydromorphone Hcl]     Per patient she was told she had a reaction after surgery-reaction unknown-jaf    ROS     Objective:     Physical Exam  There were no vitals taken for this visit. Wt Readings from Last 3 Encounters:  06/28/18 192  lb (87.1 kg)  04/08/18 195 lb 3.2 oz (88.5 kg)  02/26/18 188 lb (85.3 kg)    There are no preventive care reminders to display for this patient.  There are no preventive care reminders to display for this patient.   Lab Results  Component Value Date   TSH 3.15 08/11/2014   Lab Results  Component Value Date   WBC 6.6 04/08/2018   HGB 14.4 04/08/2018   HCT 42.0 04/08/2018   MCV 91.5 04/08/2018   PLT 257.0 04/08/2018   Lab Results  Component Value Date   NA 145 04/08/2018   K 4.4 04/08/2018   CO2 28 04/08/2018   GLUCOSE 103 (H) 04/08/2018   BUN 22 04/08/2018   CREATININE 0.72 04/08/2018   BILITOT 0.7 12/25/2014   ALKPHOS 47 12/25/2014   AST 32 12/25/2014   ALT 10 (L) 12/25/2014   PROT 5.6 (L) 12/25/2014   ALBUMIN 3.0 (L) 12/25/2014   CALCIUM 9.7 04/08/2018   ANIONGAP 7 12/25/2014   GFR 82.59 04/08/2018   Lab Results  Component Value Date   CHOL 171 08/21/2017   Lab Results  Component Value Date   HDL 45.10 08/21/2017   Lab Results  Component Value Date   LDLCALC 111 (H) 01/20/2016   Lab Results  Component Value Date   TRIG 207.0 (H) 08/21/2017   Lab Results  Component Value Date   CHOLHDL 4 08/21/2017   Lab Results  Component Value Date   HGBA1C 5.9 08/21/2017       Assessment & Plan:   Problem List Items Addressed This Visit    None       No orders of the defined types were placed in this encounter.    Franco Collet, CMA

## 2018-07-18 ENCOUNTER — Encounter: Payer: Self-pay | Admitting: Family Medicine

## 2018-07-22 ENCOUNTER — Ambulatory Visit: Payer: Self-pay

## 2018-07-22 ENCOUNTER — Ambulatory Visit: Payer: Self-pay | Admitting: Family Medicine

## 2018-07-22 MED ORDER — PREDNISONE 5 MG PO TABS: ORAL_TABLET | ORAL | 0 refills | Status: DC

## 2018-07-22 MED ORDER — DOXYCYCLINE HYCLATE 100 MG PO TABS: 100.0000 mg | ORAL_TABLET | Freq: Two times a day (BID) | ORAL | 0 refills | Status: DC

## 2018-07-22 NOTE — Telephone Encounter (Signed)
Doxy and new steroid Rx sent to pharmacy. It is ridiculous that Flovent would be that expensive.

## 2018-07-22 NOTE — Telephone Encounter (Signed)
See below

## 2018-07-22 NOTE — Telephone Encounter (Signed)
See note

## 2018-07-22 NOTE — Telephone Encounter (Signed)
Added from my chart message  The inhaler you recommended was $492. Sorry, I passed.  Also per our phone conversation I have called patient she has c/a app and would like to have medication called in. I have verified pharmacy. She will go to pharmacy to get meds in about an hour. She was informed if increase in symptoms to give our office a call

## 2018-07-22 NOTE — Telephone Encounter (Signed)
Incoming call from [facility] a complaint of productive cough and post nasal drip Mucous is greenish on color.Patient  has a history of COPD and bronchitis.   History of Bronchitis also.. Patient has been dealing with this since  Christmas Eve.  Patient states that she has has been  Under a lot of stress lately due to the fact that her husband had emergency surgery related to gallbladder removal.  Patient scheduled for same day appointment today January , 13. 2020.  With Briscoe Deutscher MD.  Patient voiced understanding.  Provided  Care advice.  Patient voiced understanding.    Reason for Disposition . Cough has been present for > 3 weeks  Answer Assessment - Initial Assessment Questions 1. ONSET: "When did the cough begin?"      Christmas eve 2. SEVERITY: "How bad is the cough today?"      moderate 3. RESPIRATORY DISTRESS: "Describe your breathing."      Yes use  inhaler 4. FEVER: "Do you have a fever?" If so, ask: "What is your temperature, how was it measured, and when did it start?"     Not to her knoledge 5. SPUTUM: "Describe the color of your sputum" (clear, white, yellow, green)     Light green 6. HEMOPTYSIS: "Are you coughing up any blood?" If so ask: "How much?" (flecks, streaks, tablespoons, etc.)     no 7. CARDIAC HISTORY: "Do you have any history of heart disease?" (e.g., heart attack, congestive heart failure)      denies 8. LUNG HISTORY: "Do you have any history of lung disease?"  (e.g., pulmonary embolus, asthma, emphysema)     copd 9. PE RISK FACTORS: "Do you have a history of blood clots?" (or: recent major surgery, recent prolonged travel, bedridden)     Not to my knowledge 10. OTHER SYMPTOMS: "Do you have any other symptoms?" (e.g., runny nose, wheezing, chest pain)       *No Answer* 11. PREGNANCY: "Is there any chance you are pregnant?" "When was your last menstrual period?"       na 12. TRAVEL: "Have you traveled out of the country in the last month?" (e.g., travel  history, exposures)       no  Protocols used: [place]

## 2018-07-28 ENCOUNTER — Emergency Department (HOSPITAL_COMMUNITY)
Admission: EM | Admit: 2018-07-28 | Discharge: 2018-07-28 | Disposition: A | Discharge status: Home / Self Care | Payer: Medicare HMO | Attending: Emergency Medicine | Admitting: Emergency Medicine

## 2018-07-28 ENCOUNTER — Emergency Department (HOSPITAL_COMMUNITY): Payer: Medicare HMO

## 2018-07-28 ENCOUNTER — Encounter (HOSPITAL_COMMUNITY): Payer: Self-pay | Admitting: Emergency Medicine

## 2018-07-28 ENCOUNTER — Other Ambulatory Visit: Payer: Self-pay

## 2018-07-28 DIAGNOSIS — R030 Elevated blood-pressure reading, without diagnosis of hypertension: Secondary | ICD-10-CM | POA: Diagnosis not present

## 2018-07-28 DIAGNOSIS — Y939 Activity, unspecified: Secondary | ICD-10-CM | POA: Insufficient documentation

## 2018-07-28 DIAGNOSIS — W540XXA Bitten by dog, initial encounter: Secondary | ICD-10-CM | POA: Insufficient documentation

## 2018-07-28 DIAGNOSIS — S61411A Laceration without foreign body of right hand, initial encounter: Secondary | ICD-10-CM | POA: Diagnosis not present

## 2018-07-28 DIAGNOSIS — Y999 Unspecified external cause status: Secondary | ICD-10-CM | POA: Insufficient documentation

## 2018-07-28 DIAGNOSIS — I10 Essential (primary) hypertension: Secondary | ICD-10-CM | POA: Insufficient documentation

## 2018-07-28 DIAGNOSIS — J449 Chronic obstructive pulmonary disease, unspecified: Secondary | ICD-10-CM | POA: Diagnosis not present

## 2018-07-28 DIAGNOSIS — Y929 Unspecified place or not applicable: Secondary | ICD-10-CM | POA: Insufficient documentation

## 2018-07-28 DIAGNOSIS — S6991XA Unspecified injury of right wrist, hand and finger(s), initial encounter: Secondary | ICD-10-CM | POA: Diagnosis present

## 2018-07-28 DIAGNOSIS — S61451A Open bite of right hand, initial encounter: Secondary | ICD-10-CM | POA: Diagnosis not present

## 2018-07-28 MED ORDER — LIDOCAINE-EPINEPHRINE-TETRACAINE (LET) SOLUTION: 3.0000 mL | Freq: Once | NASAL | Status: DC

## 2018-07-28 MED ORDER — CLINDAMYCIN HCL 300 MG PO CAPS
300.0000 mg | ORAL_CAPSULE | Freq: Once | ORAL | Status: AC
  Administered 2018-07-28: 300 mg via ORAL
  Filled 2018-07-28: qty 1

## 2018-07-28 MED ORDER — CLINDAMYCIN HCL 300 MG PO CAPS: 300.0000 mg | ORAL_CAPSULE | Freq: Three times a day (TID) | ORAL | 0 refills | Status: AC

## 2018-07-28 NOTE — ED Provider Notes (Signed)
[place] DEPT Provider Note   CSN: [id number] Arrival date & time: 07/28/18  1340     History   Chief Complaint Chief Complaint  Patient presents with  . Animal Bite    HPI Sheila Shields is a 84 y.o. female.  HPI  Patient is an 84 year old female with a history of hyperlipidemia, hypertension, arthritis, and COPD presenting for dog bite to her right hand.  Patient reports that she was trying to pull a piece of paper away from her puppy when the dog's mouth grazed her dorsum of the right hand.  Patient reports that bleeding was controlled with compression.  She denies any weakness or numbness in the hand.  She reports that her tetanus shot is up-to-date within 5 years.  She reports that the dog is up-to-date on all rabies vaccinations.  Patient takes 81 mg of aspirin but no other blood thinners.  Past Medical History:  Diagnosis Date  . Alcoholism in remission ([place]) 08/27/2007  . Anemia   . Arthritis    OA  . COPD 12/17/2006  . Diverticulosis 03/27/2007  . GERD 05/01/2007  . HYPERGLYCEMIA 08/27/2007  . HYPERLIPIDEMIA 12/17/2006  . HYPERTENSION 12/17/2006  . Mood disorder ([facility])    hx anxiety and depression  . Rectal polyp 03/27/2007   adenoma    Patient Active Problem List   Diagnosis Date Noted  . Aortic atherosclerosis ([place]) 04/11/2018  . Allergic rhinitis 11/09/2017  . Arm mass, left 04/16/2017  . Chronic cough 10/11/2015  . COPD (chronic obstructive pulmonary disease) ([place]) 11/18/2014  . Iron deficiency anemia, unspecified 01/20/2011  . Arthropathy 02/23/2010  . HYPERGLYCEMIA 08/27/2007  . Gastroesophageal reflux disease 05/01/2007  . Hyperlipemia 12/17/2006  . Essential hypertension 12/17/2006    Past Surgical History:  Procedure Laterality Date  . ABDOMINAL HYSTERECTOMY  1978   fibroma  . APPENDECTOMY  2002  . BREAST BIOPSY     negative  . BREAST EXCISIONAL BIOPSY Right    2003  . CARPAL TUNNEL RELEASE Left 2010 or 2011  .  COLONOSCOPY W/ POLYPECTOMY  03/27/2007; n7/19/2012   2008: 4 mm adenoma, diverticulosis 2012: ileitis ? NSAID - likely, 2-3 mm cecal polyp LYMPHOID FOLLICLE, diverticulosis  . ESOPHAGOGASTRODUODENOSCOPY  01/26/2011   reflux esophagitis, colonscopy done also  . HAMMER TOE SURGERY  2008   left foot  . HERNIA REPAIR Right 1996?  . ORIF ANKLE FRACTURE Right 12/22/2014   Procedure: OPEN REDUCTION INTERNAL FIXATION (ORIF) ANKLE FRACTURE;  Surgeon: Susa Day, MD;  Location: [facility];  Service: Orthopedics;  Laterality: Right;  . SHOULDER OPEN ROTATOR CUFF REPAIR Left 03/06/2013   Procedure: LEFT ROTATOR CUFF REPAIR, SUBACROMIAL DECOMPRESSION, PATCH GRAFT, MANIPULATION UNDER ANESTHESIA;  Surgeon: Johnn Hai, MD;  Location: [facility];  Service: Orthopedics;  Laterality: Left;     OB History   No obstetric history on file.      Home Medications    Prior to Admission medications   Medication Sig Start Date End Date Taking? Authorizing Provider  acetaminophen ([employer] ARTHRITIS PAIN RELIEF) 650 MG CR tablet Take 1,300 mg by mouth 2 (two) times daily.    [provider]  albuterol (PROAIR HFA) 108 (90 Base) MCG/ACT inhaler INHALE 2 PUFFS EVERY 4 HOURS AS NEEDED FOR COUGHING SPELLS 12/15/16   Lucretia Kern, DO  aspirin 81 MG tablet Take 81 mg by mouth daily.    [provider]  Coenzyme Q10 (COQ10 PO) Take by mouth daily.    [provider]  doxycycline (VIBRA-TABS) 100 MG tablet Take 1 tablet (100 mg total) by mouth 2 (two) times daily. 07/22/18   Briscoe Deutscher, DO  ferrous sulfate 325 (65 FE) MG tablet TAKE 1 TABLET BY MOUTH EVERY DAY 07/26/15   Dutch Quint B, FNP  fluticasone [facility]) 50 MCG/ACT nasal spray Place 2 sprays into both nostrils daily. 11/12/17   Collene Gobble, MD  fluticasone (FLOVENT HFA) 220 MCG/ACT inhaler Inhale 1 puff into the lungs 2 (two) times daily. 07/15/18   Briscoe Deutscher, DO  hydrochlorothiazide (HYDRODIURIL) 25 MG tablet TAKE ONE TABLET BY MOUTH  DAILY 05/02/18   Lucretia Kern, DO  loratadine (CLARITIN) 10 MG tablet Take 1 tablet (10 mg total) by mouth daily. Patient taking differently: Take 10 mg by mouth as needed.  08/31/15   Collene Gobble, MD  meclizine (ANTIVERT) 25 MG tablet Take 1 tablet (25 mg total) by mouth 3 (three) times daily as needed for dizziness. 08/06/17   Lucretia Kern, DO  Multiple Vitamin (MULTIVITAMIN WITH MINERALS) TABS tablet Take 1 tablet by mouth daily.    [provider]  pantoprazole (PROTONIX) 40 MG tablet Take 1 tablet (40 mg total) by mouth daily. 12/26/17   Zehr, Janett Billow D, PA-C  potassium chloride SA (K-DUR,KLOR-CON) 20 MEQ tablet TAKE 1 TABLET BY MOUTH DAILY 01/28/18   Lucretia Kern, DO  predniSONE (DELTASONE) 5 MG tablet 6-5-4-3-2-1-off 07/22/18   Briscoe Deutscher, DO  ranitidine (ZANTAC) 300 MG tablet Take 1 tablet (300 mg total) by mouth at bedtime. 12/26/17   Zehr, Janett Billow D, PA-C  simvastatin (ZOCOR) 20 MG tablet TAKE ONE TABLET BY MOUTH EVERY MORNING 03/25/18   Lucretia Kern, DO  Vitamin D, Cholecalciferol, 1000 units CAPS Take 1 capsule by mouth daily.    [provider]    Family History Family History  Problem Relation Age of Onset  . Throat cancer Mother   . Heart attack Father 61  . Idiopathic pulmonary fibrosis Brother 77  . Cancer Brother        sarcoma  . Heart disease Brother   . Colon cancer Neg Hx   . Esophageal cancer Neg Hx   . Stomach cancer Neg Hx     Social History Social History   Tobacco Use  . Smoking status: Former Smoker    Packs/day: 2.00    Years: 30.00    Pack years: 60.00    Types: Cigarettes    Last attempt to quit: 07/10/1984    Years since quitting: 34.0  . Smokeless tobacco: Never Used  Substance Use Topics  . Alcohol use: No    Alcohol/week: 0.0 standard drinks    Comment: no alcoho since 1983  . Drug use: No     Allergies   Alcohol; Codeine; and Dilaudid [hydromorphone hcl]   Review of Systems Review of Systems  Skin: Positive  for wound. Negative for color change.  Neurological: Negative for weakness and numbness.     Physical Exam Updated Vital Signs BP (!) 163/71 (BP Location: Left Arm)   Pulse 75   Temp (!) 97.5 F (36.4 C) (Oral)   Resp 17   Ht 5\' 6"  (1.676 m)   Wt 83 kg   SpO2 95%   BMI 29.54 kg/m   Physical Exam Vitals signs and nursing note reviewed.  Constitutional:      General: She is not in acute distress.    Appearance: She is well-developed. She is not diaphoretic.  Comments: Sitting comfortably in bed.  HENT:     Head: Normocephalic and atraumatic.  Eyes:     General:        Right eye: No discharge.        Left eye: No discharge.     Conjunctiva/sclera: Conjunctivae normal.     Comments: EOMs normal to gross examination.  Neck:     Musculoskeletal: Normal range of motion.  Cardiovascular:     Rate and Rhythm: Normal rate and regular rhythm.     Comments: Intact, 2+ radial pulse of RUE.  Pulmonary:     Comments: Patient converses comfortably without audible wheeze or stridor. Abdominal:     General: There is no distension.  Musculoskeletal: Normal range of motion.  Skin:    General: Skin is warm and dry.     Comments: Patient has a stellate laceration to the dorsum of her right hand.  Each flap approximately 1 cm.  No violation of muscle or fascia. 2+ capillary refill distally.  Full sensation all fingers of the right hand.  Neurological:     Mental Status: She is alert.     Comments: Cranial nerves intact to gross observation. Patient moves extremities without difficulty.  Psychiatric:        Behavior: Behavior normal.        Thought Content: Thought content normal.        Judgment: Judgment normal.      ED Treatments / Results  Labs (all labs ordered are listed, but only abnormal results are displayed) Labs Reviewed - No data to display  EKG None  Radiology Dg Hand Complete Right  Result Date: 07/28/2018 CLINICAL DATA:  Dog bite of right hand. EXAM:  RIGHT HAND - COMPLETE 3+ VIEW COMPARISON:  None. FINDINGS: There is no evidence of fracture or dislocation. Degenerative joint changes with narrowed joint space and osteophyte formation are identified in multiple digits and in the carpal bones. Soft tissues are unremarkable. IMPRESSION: No acute abnormality identified. No radiopaque foreign body identified. Electronically Signed   By: Abelardo Diesel M.D.   On: 07/28/2018 15:04    Procedures Procedures (including critical care time)  Medications Ordered in ED Medications  lidocaine-EPINEPHrine-tetracaine (LET) solution (has no administration in time range)     Initial Impression / Assessment and Plan / ED Course  I have reviewed the triage vital signs and the nursing notes.  Pertinent labs & imaging results that were available during my care of the patient were reviewed by me and considered in my medical decision making (see chart for details).     Patient well-appearing and in no acute distress.  Patient is neurovascularly intact in the right upper extremity.  Patient has stellate laceration from dog bite to the dorsum of the right hand.  Due to high risk wound, will not primarily closed.  X-ray demonstrated no foreign body.  Wound was copiously irrigated with 750 mL normal saline.  Dressing applied.  Patient is already on doxycycline has 5 more days of it for a COPD exacerbation.  Will add clindamycin.  Return precautions given for any erythema, drainage, or swelling.  Recommended wound check by primary care in 2 days.  Patient is in understanding and agrees with the plan of care.  This is a shared visit with Dr. Trish Mage. Patient was independently evaluated by this attending physician. Attending physician consulted in evaluation and discharge management.  Final Clinical Impressions(s) / ED Diagnoses   Final diagnoses:  Dog bite, initial encounter  Elevated blood pressure reading with diagnosis of hypertension    ED Discharge Orders           Ordered    clindamycin (CLEOCIN) 300 MG capsule  3 times daily     07/28/18 1540           Albesa Seen, [place] 07/28/18 1542    Veryl Speak, MD 07/28/18 1558

## 2018-07-28 NOTE — Discharge Instructions (Addendum)
Please see the information and instructions below regarding your visit.  Your diagnoses today include:  1. Dog bite, initial encounter   2. Elevated blood pressure reading with diagnosis of hypertension     Tests performed today include: See side panel of your discharge paperwork for testing performed today. Vital signs are listed at the bottom of these instructions.   Medications prescribed:    Take any prescribed medications only as prescribed, and any over the counter medications only as directed on the packaging.  Please take all of your antibiotics until finished.   You may develop abdominal discomfort or nausea from the antibiotic. If this occurs, you may take it with food. Some patients also get diarrhea with antibiotics. You may help offset this with probiotics which you can buy or get in yogurt. Do not eat or take the probiotics until 2 hours after your antibiotic. Some women develop vaginal yeast infections after antibiotics. If you develop unusual vaginal discharge after being on this medication, please see your primary care provider.   Some people develop allergies to antibiotics. Symptoms of antibiotic allergy can be mild and include a flat rash and itching. They can also be more serious and include:  ?Hives - Hives are raised, red patches of skin that are usually very itchy.  ?Lip or tongue swelling  ?Trouble swallowing or breathing  ?Blistering of the skin or mouth.  If you have any of these serious symptoms, please seek emergency medical care immediately.  Home care instructions:  Please follow any educational materials contained in this packet.   Please wash the wound with soap and water daily.  Please apply the dressing that I showed you tomorrow.  Once this forms in a scab, you should be able to apply Neosporin or bacitracin and a large bandage over top.  Follow-up instructions: Please follow-up with your primary care provider in 2 days for wound recheck.    Return instructions:  Please return to the Emergency Department if you experience worsening symptoms.  Please return to the emergency department if you develop any redness of the wound, increasing swelling or drainage that is white, green, or yellow.  Please return if you have any other emergent concerns.  Additional Information:   Your vital signs today were: BP (!) 163/71 (BP Location: Left Arm)    Pulse 75    Temp (!) 97.5 F (36.4 C) (Oral)    Resp 17    Ht 5\' 6"  (1.676 m)    Wt 83 kg    SpO2 95%    BMI 29.54 kg/m  If your blood pressure (BP) was elevated on multiple readings during this visit above 130 for the top number or above 80 for the bottom number, please have this repeated by your primary care provider within one month. --------------  Thank you for allowing [place] to participate in your care today.

## 2018-07-28 NOTE — ED Triage Notes (Signed)
Pt with dog bite to her L hand. Dog is her own and up to date on vaccinations. Pt states she is up to date on her tetanus.

## 2018-07-30 NOTE — Progress Notes (Signed)
Sheila Shields is a 84 y.o. female is here for follow up.  History of Present Illness:   Sheila Shields, CMA acting as scribe for Dr. Briscoe Deutscher.   HPI: Patient in office for follow up on ED visit. She was bitten by her puppy while playing, causing skin tear on right hand. Has been cleaning and keeping covered. She has been taking antibiotics as well as using neosporin as directed. No fevers, chills, draining, streaks, or increased pain.   There are no preventive care reminders to display for this patient. Depression screen [facility] 2/9 02/26/2018 02/22/2017 01/20/2016  Decreased Interest 0 0 0  Down, Depressed, Hopeless 0 0 0  PHQ - 2 Score 0 0 0   PMHx, SurgHx, SocialHx, FamHx, Medications, and Allergies were reviewed in the Visit Navigator and updated as appropriate.   Patient Active Problem List   Diagnosis Date Noted  . Aortic atherosclerosis ([place]) 04/11/2018  . Allergic rhinitis 11/09/2017  . Arm mass, left 04/16/2017  . Chronic cough 10/11/2015  . COPD (chronic obstructive pulmonary disease) ([place]) 11/18/2014  . Iron deficiency anemia, unspecified 01/20/2011  . Arthropathy 02/23/2010  . HYPERGLYCEMIA 08/27/2007  . Gastroesophageal reflux disease 05/01/2007  . Hyperlipemia 12/17/2006  . Essential hypertension 12/17/2006   Social History   Tobacco Use  . Smoking status: Former Smoker    Packs/day: 2.00    Years: 30.00    Pack years: 60.00    Types: Cigarettes    Last attempt to quit: 07/10/1984    Years since quitting: 34.0  . Smokeless tobacco: Never Used  Substance Use Topics  . Alcohol use: No    Alcohol/week: 0.0 standard drinks    Comment: no alcoho since 1983  . Drug use: No   Current Medications and Allergies:   .  acetaminophen ([employer] ARTHRITIS PAIN RELIEF) 650 MG CR tablet, Take 1,300 mg by mouth 2 (two) times daily., Disp: , Rfl:  .  albuterol (PROAIR HFA) 108 (90 Base) MCG/ACT inhaler, INHALE 2 PUFFS EVERY 4 HOURS AS NEEDED FOR COUGHING SPELLS, Disp: 8.5  Inhaler, Rfl: 2 .  aspirin 81 MG tablet, Take 81 mg by mouth daily., Disp: , Rfl:  .  clindamycin (CLEOCIN) 300 MG capsule, Take 1 capsule (300 mg total) by mouth 3 (three) times daily for 5 days., Disp: 14 capsule, Rfl: 0 .  Coenzyme Q10 (COQ10 PO), Take by mouth daily., Disp: , Rfl:  .  doxycycline (VIBRA-TABS) 100 MG tablet, Take 1 tablet (100 mg total) by mouth 2 (two) times daily., Disp: 20 tablet, Rfl: 0 .  ferrous sulfate 325 (65 FE) MG tablet, TAKE 1 TABLET BY MOUTH EVERY DAY, Disp: 90 tablet, Rfl: 3 .  fluticasone (FLONASE) 50 MCG/ACT nasal spray, Place 2 sprays into both nostrils daily., Disp: 16 g, Rfl: 5 .  fluticasone (FLOVENT HFA) 220 MCG/ACT inhaler, Inhale 1 puff into the lungs 2 (two) times daily., Disp: 1 Inhaler, Rfl: 12 .  hydrochlorothiazide (HYDRODIURIL) 25 MG tablet, TAKE ONE TABLET BY MOUTH DAILY, Disp: 90 tablet, Rfl: 1 .  loratadine (CLARITIN) 10 MG tablet, Take 1 tablet (10 mg total) by mouth daily. (Patient taking differently: Take 10 mg by mouth as needed. ), Disp: 90 tablet, Rfl: 3 .  meclizine (ANTIVERT) 25 MG tablet, Take 1 tablet (25 mg total) by mouth 3 (three) times daily as needed for dizziness., Disp: 30 tablet, Rfl: 0 .  Multiple Vitamin (MULTIVITAMIN WITH MINERALS) TABS tablet, Take 1 tablet by mouth daily., Disp: , Rfl:  .  pantoprazole (PROTONIX) 40 MG tablet, Take 1 tablet (40 mg total) by mouth daily., Disp: 90 tablet, Rfl: 3 .  potassium chloride SA (K-DUR,KLOR-CON) 20 MEQ tablet, TAKE 1 TABLET BY MOUTH DAILY, Disp: 90 tablet, Rfl: 1 .  predniSONE (DELTASONE) 5 MG tablet, 6-5-4-3-2-1-off, Disp: 21 tablet, Rfl: 0 .  ranitidine (ZANTAC) 300 MG tablet, Take 1 tablet (300 mg total) by mouth at bedtime., Disp: 90 tablet, Rfl: 3 .  simvastatin (ZOCOR) 20 MG tablet, TAKE ONE TABLET BY MOUTH EVERY MORNING, Disp: 90 tablet, Rfl: 2 .  Vitamin D, Cholecalciferol, 1000 units CAPS, Take 1 capsule by mouth daily., Disp: , Rfl:    Allergies  Allergen Reactions  .  Alcohol   . Codeine     Patient prefers not to take this due to history of alcoholism  . Dilaudid [Hydromorphone Hcl]     Per patient she was told she had a reaction after surgery-reaction [id number]-jaf   Review of Systems   Pertinent items are noted in the HPI. Otherwise, a complete ROS is negative.  Vitals:   Vitals:   07/31/18 1100  BP: (!) 158/82  Pulse: 82  Temp: 97.8 F (36.6 C)  TempSrc: Oral  Weight: 186 lb 12.8 oz (84.7 kg)  Height: 5\' 6"  (1.676 m)     Body mass index is 30.15 kg/m.  Physical Exam:   Physical Exam Vitals signs and nursing note reviewed.  HENT:     Head: Normocephalic and atraumatic.  Eyes:     Pupils: Pupils are equal, round, and reactive to light.  Neck:     Musculoskeletal: Normal range of motion and neck supple.  Cardiovascular:     Rate and Rhythm: Normal rate and regular rhythm.     Heart sounds: Normal heart sounds.  Pulmonary:     Effort: Pulmonary effort is normal.  Abdominal:     Palpations: Abdomen is soft.  Skin:    General: Skin is warm.     Comments: Right hand, dorsum with healing skin tear, without signs of infection.   Neurological:     Mental Status: She is alert.  Psychiatric:        Behavior: Behavior normal.    Assessment and Plan:   Tekelia was seen today for follow-up.  Diagnoses and all orders for this visit:  Skin tear of right hand without complication, sequela Comments: Reviewed ED visit. Healing well. Provided supplies to keep clean and covered while traveling this week. Red flags reviewed.     . Orders and follow up as documented in [place], reviewed diet, exercise and weight control, cardiovascular risk and specific lipid/LDL goals reviewed, reviewed medications and side effects in detail.  . Reviewed expectations re: course of current medical issues. . Outlined signs and symptoms indicating need for more acute intervention. . Patient verbalized understanding and all questions were  answered. . Patient received an After Visit Summary.  CMA served as Education administrator during this visit. History, Physical, and Plan performed by medical provider. The above documentation has been reviewed and is accurate and complete. Briscoe Deutscher, D.O.  Briscoe Deutscher, DO [place] [place] [facility] 08/01/2018

## 2018-07-31 ENCOUNTER — Encounter: Payer: Self-pay | Admitting: Family Medicine

## 2018-07-31 ENCOUNTER — Other Ambulatory Visit: Payer: Self-pay | Admitting: Family Medicine

## 2018-07-31 ENCOUNTER — Ambulatory Visit (INDEPENDENT_AMBULATORY_CARE_PROVIDER_SITE_OTHER): Payer: Medicare HMO | Admitting: Family Medicine

## 2018-07-31 VITALS — BP 158/82 | HR 82 | Temp 97.8°F | Ht 66.0 in | Wt 186.8 lb

## 2018-07-31 DIAGNOSIS — S61411S Laceration without foreign body of right hand, sequela: Secondary | ICD-10-CM | POA: Diagnosis not present

## 2018-08-01 ENCOUNTER — Encounter: Payer: Self-pay | Admitting: Family Medicine

## 2018-08-07 NOTE — Progress Notes (Signed)
HPI:  Using dictation device. Unfortunately this device frequently misinterprets words/phrases.  Sheila Shields is a pleasant 84 y.o. here for follow up. Chronic medical problems summarized below were reviewed for changes and stability and were updated as needed below. These issues and their treatment remain stable for the most part.  Unfortunately, suffered COPD exacerbation, dog bite and skin tear since her last visit with me. These are healed. Now reports doing better, but persistent cough and still some sob at times and alb use. Using flovent only once per day. No fevers or excessive sputum or hemoptysis. Reports had 2 rounds of abx and steroids. Due for labs. AWV was 02/26/18  HTN: -meds: asa, losartan-hctz 100-12.5in the past -now hctzalone due to pt preference and less need with wt reduction  GERD: -Pantoprazole 40mg   HLD/Obesity/Hyperglycemia: -meds: simvastatin, fish oil -doing Loose It App on phone and silver sneakers (04/2017) -wt 207 2/18 --> 195 (10/18)--> 191 (2/19) --> 189 (1/20)  Hx of Anemia, chronic: -dx and eval with prior PCP per report -takes iron daily  OA/chronic back pain.R ankle injury: -sees ortho - Dr. Maxie Better and Dr. Nelva Bush -take tylenol most days  COPD/Chronic Cough/Allergies: -seeing pulm -meds flovent once daily 1 puff, prn alb  Vertigo: -See office visit notes 08/06/2017 -Presumed BPPV along with sinus issues  ROS: See pertinent positives and negatives per HPI.  Past Medical History:  Diagnosis Date  . Alcoholism in remission ([place]) 08/27/2007  . Anemia   . Arthritis    OA  . COPD 12/17/2006  . Diverticulosis 03/27/2007  . GERD 05/01/2007  . HYPERGLYCEMIA 08/27/2007  . HYPERLIPIDEMIA 12/17/2006  . HYPERTENSION 12/17/2006  . Mood disorder (HCC)    hx anxiety and depression  . Rectal polyp 03/27/2007   adenoma    Past Surgical History:  Procedure Laterality Date  . ABDOMINAL HYSTERECTOMY  1978   fibroma  . APPENDECTOMY  2002  .  BREAST BIOPSY     negative  . BREAST EXCISIONAL BIOPSY Right    2003  . CARPAL TUNNEL RELEASE Left 2010 or 2011  . COLONOSCOPY W/ POLYPECTOMY  03/27/2007; n7/19/2012   2008: 4 mm adenoma, diverticulosis 2012: ileitis ? NSAID - likely, 2-3 mm cecal polyp LYMPHOID FOLLICLE, diverticulosis  . ESOPHAGOGASTRODUODENOSCOPY  01/26/2011   reflux esophagitis, colonscopy done also  . HAMMER TOE SURGERY  2008   left foot  . HERNIA REPAIR Right 1996?  . ORIF ANKLE FRACTURE Right 12/22/2014   Procedure: OPEN REDUCTION INTERNAL FIXATION (ORIF) ANKLE FRACTURE;  Surgeon: Susa Day, MD;  Location: [facility];  Service: Orthopedics;  Laterality: Right;  . SHOULDER OPEN ROTATOR CUFF REPAIR Left 03/06/2013   Procedure: LEFT ROTATOR CUFF REPAIR, SUBACROMIAL DECOMPRESSION, PATCH GRAFT, MANIPULATION UNDER ANESTHESIA;  Surgeon: Johnn Hai, MD;  Location: [facility];  Service: Orthopedics;  Laterality: Left;    Family History  Problem Relation Age of Onset  . Throat cancer Mother   . Heart attack Father 70  . Idiopathic pulmonary fibrosis Brother 9  . Cancer Brother        sarcoma  . Heart disease Brother   . Colon cancer Neg Hx   . Esophageal cancer Neg Hx   . Stomach cancer Neg Hx     SOCIAL HX: see hpi   Current Outpatient Medications:  .  acetaminophen ([employer] ARTHRITIS PAIN RELIEF) 650 MG CR tablet, Take 1,300 mg by mouth 2 (two) times daily., Disp: , Rfl:  .  albuterol (PROAIR HFA) 108 (90 Base) MCG/ACT inhaler,  INHALE 2 PUFFS EVERY 4 HOURS AS NEEDED FOR COUGHING SPELLS, Disp: 8.5 Inhaler, Rfl: 2 .  aspirin 81 MG tablet, Take 81 mg by mouth daily., Disp: , Rfl:  .  Coenzyme Q10 (COQ10 PO), Take by mouth daily., Disp: , Rfl:  .  ferrous sulfate 325 (65 FE) MG tablet, TAKE 1 TABLET BY MOUTH EVERY DAY, Disp: 90 tablet, Rfl: 3 .  fluticasone (FLONASE) 50 MCG/ACT nasal spray, Place 2 sprays into both nostrils daily., Disp: 16 g, Rfl: 5 .  fluticasone (FLOVENT HFA) 220 MCG/ACT inhaler, Inhale 1 puff into  the lungs 2 (two) times daily., Disp: 1 Inhaler, Rfl: 12 .  hydrochlorothiazide (HYDRODIURIL) 25 MG tablet, TAKE ONE TABLET BY MOUTH DAILY, Disp: 90 tablet, Rfl: 1 .  loratadine (CLARITIN) 10 MG tablet, Take 1 tablet (10 mg total) by mouth daily. (Patient taking differently: Take 10 mg by mouth as needed. ), Disp: 90 tablet, Rfl: 3 .  meclizine (ANTIVERT) 25 MG tablet, Take 1 tablet (25 mg total) by mouth 3 (three) times daily as needed for dizziness., Disp: 30 tablet, Rfl: 0 .  Multiple Vitamin (MULTIVITAMIN WITH MINERALS) TABS tablet, Take 1 tablet by mouth daily., Disp: , Rfl:  .  pantoprazole (PROTONIX) 40 MG tablet, Take 1 tablet (40 mg total) by mouth daily., Disp: 90 tablet, Rfl: 3 .  potassium chloride SA (K-DUR,KLOR-CON) 20 MEQ tablet, TAKE 1 TABLET BY MOUTH DAILY, Disp: 90 tablet, Rfl: 0 .  ranitidine (ZANTAC) 300 MG tablet, Take 1 tablet (300 mg total) by mouth at bedtime., Disp: 90 tablet, Rfl: 3 .  simvastatin (ZOCOR) 20 MG tablet, TAKE ONE TABLET BY MOUTH EVERY MORNING, Disp: 90 tablet, Rfl: 2 .  Vitamin D, Cholecalciferol, 1000 units CAPS, Take 1 capsule by mouth daily., Disp: , Rfl:   EXAM:  Vitals:   08/08/18 0804  BP: 110/72  Pulse: 81  Temp: 98.3 F (36.8 C)  SpO2: 95%    Body mass index is 30.6 kg/m.  GENERAL: vitals reviewed and listed above, alert, oriented, appears well hydrated and in no acute distress  HEENT: atraumatic, conjunttiva clear, no obvious abnormalities on inspection of external nose and ears  NECK: no obvious masses on inspection  LUNGS: clear to auscultation bilaterally, no wheezes, rales or rhonchi, good air movement  CV: HRRR, no peripheral edema  MS: moves all extremities without noticeable abnormality  PSYCH: pleasant and cooperative, no obvious depression or anxiety  ASSESSMENT AND PLAN:  Discussed the following assessment and plan:  Acute bronchitis with COPD (HCC)  Cough - Plan: DG Chest 2 View  Essential hypertension - Plan:  Basic metabolic panel, CBC  Aortic atherosclerosis (HCC)  Hyperlipidemia, unspecified hyperlipidemia type - Plan: Lipid panel  HYPERGLYCEMIA  Gastroesophageal reflux disease, esophagitis presence not specified  COPD with exacerbation ([place])  -xray given ongoing cough and occ dypsnea, follow up with pulm specialist, she agrees to call today -in interim advised taking the flovent bid, alb prn -labs per orders for chronic disease, lifestyle recs -Patient advised to return or notify a doctor immediately if symptoms worsen or persist or new concerns arise.  Patient Instructions  BEFORE YOU LEAVE: -xray -lab -follow up: 3-4 months, sooner as needed  Get the xray and we will let you know what the radiology reports.  We have ordered labs or studies at this visit. It can take up to 1-2 weeks for results and processing. IF results require follow up or explanation, we will call you with instructions. Clinically stable results  will be released to your [facility]. If you have not heard from [place] or cannot find your results in [facility] in 2 weeks please contact our office at [phone].  If you are not yet signed up for [facility], please consider signing up.  Take the flovent twice daily, albuterol as needed and call your pulmonologist today about the persistent cough.  I hope you are feeling better soon! Seek care promptly if your symptoms worsen, new concerns arise or you are not improving with treatment.         Lucretia Kern, DO

## 2018-08-08 ENCOUNTER — Ambulatory Visit (INDEPENDENT_AMBULATORY_CARE_PROVIDER_SITE_OTHER): Payer: Medicare HMO | Admitting: Family Medicine

## 2018-08-08 ENCOUNTER — Encounter: Payer: Self-pay | Admitting: Family Medicine

## 2018-08-08 ENCOUNTER — Ambulatory Visit (INDEPENDENT_AMBULATORY_CARE_PROVIDER_SITE_OTHER): Payer: Medicare HMO

## 2018-08-08 VITALS — BP 110/72 | HR 81 | Temp 98.3°F | Ht 66.0 in | Wt 189.6 lb

## 2018-08-08 DIAGNOSIS — J209 Acute bronchitis, unspecified: Secondary | ICD-10-CM

## 2018-08-08 DIAGNOSIS — E785 Hyperlipidemia, unspecified: Secondary | ICD-10-CM

## 2018-08-08 DIAGNOSIS — I700 Atherosclerosis of aorta: Secondary | ICD-10-CM

## 2018-08-08 DIAGNOSIS — I10 Essential (primary) hypertension: Secondary | ICD-10-CM | POA: Diagnosis not present

## 2018-08-08 DIAGNOSIS — J441 Chronic obstructive pulmonary disease with (acute) exacerbation: Secondary | ICD-10-CM | POA: Diagnosis not present

## 2018-08-08 DIAGNOSIS — K219 Gastro-esophageal reflux disease without esophagitis: Secondary | ICD-10-CM

## 2018-08-08 DIAGNOSIS — R05 Cough: Secondary | ICD-10-CM

## 2018-08-08 DIAGNOSIS — J440 Chronic obstructive pulmonary disease with acute lower respiratory infection: Secondary | ICD-10-CM

## 2018-08-08 DIAGNOSIS — R7309 Other abnormal glucose: Secondary | ICD-10-CM | POA: Diagnosis not present

## 2018-08-08 DIAGNOSIS — R059 Cough, unspecified: Secondary | ICD-10-CM

## 2018-08-08 LAB — LIPID PANEL
CHOLESTEROL: 173 mg/dL (ref 0–200)
HDL: 43.6 mg/dL (ref >39.00)
LDL Cholesterol: 98 mg/dL (ref 0–99)
NONHDL: 129.2
Total CHOL/HDL Ratio: 4
Triglycerides: 156 mg/dL — ABNORMAL HIGH (ref 0.0–149.0)
VLDL: 31.2 mg/dL (ref 0.0–40.0)

## 2018-08-08 LAB — CBC
HEMATOCRIT: 43.1 % (ref 36.0–46.0)
Hemoglobin: 14.7 g/dL (ref 12.0–15.0)
MCHC: 34 g/dL (ref 30.0–36.0)
MCV: 92.3 fl (ref 78.0–100.0)
Platelets: 240 10*3/uL (ref 150.0–400.0)
RBC: 4.67 Mil/uL (ref 3.87–5.11)
RDW: 13.8 % (ref 11.5–15.5)
WBC: 6.6 10*3/uL (ref 4.0–10.5)

## 2018-08-08 LAB — BASIC METABOLIC PANEL
BUN: 13 mg/dL (ref 6–23)
CO2: 31 meq/L (ref 19–32)
CREATININE: 0.73 mg/dL (ref 0.40–1.20)
Calcium: 9.8 mg/dL (ref 8.4–10.5)
Chloride: 103 mEq/L (ref 96–112)
GFR: 76.41 mL/min (ref >60.00)
GLUCOSE: 97 mg/dL (ref 70–99)
POTASSIUM: 3.7 meq/L (ref 3.5–5.1)
Sodium: 143 mEq/L (ref 135–145)

## 2018-08-08 NOTE — Patient Instructions (Signed)
BEFORE YOU LEAVE: -xray -lab -follow up: 3-4 months, sooner as needed  Get the xray and we will let you know what the radiology reports.  We have ordered labs or studies at this visit. It can take up to 1-2 weeks for results and processing. IF results require follow up or explanation, we will call you with instructions. Clinically stable results will be released to your [facility]. If you have not heard from [place] or cannot find your results in [facility] in 2 weeks please contact our office at [phone].  If you are not yet signed up for [facility], please consider signing up.  Take the flovent twice daily, albuterol as needed and call your pulmonologist today about the persistent cough.  I hope you are feeling better soon! Seek care promptly if your symptoms worsen, new concerns arise or you are not improving with treatment.

## 2018-08-09 ENCOUNTER — Telehealth: Payer: Self-pay | Admitting: Adult Health

## 2018-08-09 ENCOUNTER — Encounter: Payer: Self-pay | Admitting: Adult Health

## 2018-08-09 ENCOUNTER — Ambulatory Visit: Payer: Medicare HMO | Admitting: Adult Health

## 2018-08-09 VITALS — BP 112/76 | HR 73 | Temp 98.1°F | Ht 66.0 in | Wt 190.4 lb

## 2018-08-09 DIAGNOSIS — J441 Chronic obstructive pulmonary disease with (acute) exacerbation: Secondary | ICD-10-CM

## 2018-08-09 MED ORDER — MOMETASONE FUROATE 200 MCG/ACT IN AERO: 2.0000 | INHALATION_SPRAY | Freq: Two times a day (BID) | RESPIRATORY_TRACT | 0 refills | Status: DC

## 2018-08-09 MED ORDER — LEVALBUTEROL HCL 0.63 MG/3ML IN NEBU
0.6300 mg | INHALATION_SOLUTION | Freq: Once | RESPIRATORY_TRACT | Status: AC
  Administered 2018-08-09: 0.63 mg via RESPIRATORY_TRACT

## 2018-08-09 MED ORDER — BENZONATATE 200 MG PO CAPS: 200.0000 mg | ORAL_CAPSULE | Freq: Three times a day (TID) | ORAL | 3 refills | Status: DC | PRN

## 2018-08-09 NOTE — Telephone Encounter (Signed)
Sent now

## 2018-08-09 NOTE — Telephone Encounter (Signed)
Left message informing patient order has been sent. Nothing further needed at this time.

## 2018-08-09 NOTE — Assessment & Plan Note (Addendum)
Resolving flare with suspected post viral cough  Hold on additional abx or steroids at this time  tx cough and control for triggers    Patient Instructions  Continue on Claritin 10mg  daily  Add Delsym 2 tsp Twice daily  As needed  Cough  Add Tessalon Three times a day  As needed for cough  Continue on Zantac daily .  May use Asmanex  2 puffs Twice daily  , rinse after use. Take until sample is gone.  Albuterol Inhaler 2 puffs every 4hr as needed. Wheezing  follow up Dr. Lamonte Sakai  In 6 weeks and As needed   Please contact office for sooner follow up if symptoms do not improve or worsen or seek emergency care

## 2018-08-09 NOTE — Progress Notes (Signed)
@Patient  ID: Sheila Shields, female    DOB: [birth year], 84 y.o.   MRN: [id number]  Chief Complaint  Patient presents with  . Acute Visit    COPD     Referring provider: Lucretia Kern, DO  HPI:  84 year old female former smoker followed for mild COPD, chronic cough and AR   TEST/EVENTS :  Pulmonary function testing from 10/09/13 in full which showed mild obstruction and an FEV1 of 86% of predicted.   08/09/2018 Acute OV : COPD  Patient presents for an acute office visit.  She complains of persistent cough . Initially got URI on 12/24 , she self treated with OTC cold meds. Cough got worse and went to PCP on 1/6 , treated with steroid taper. Symptoms did not improved , Treated with Doxycycline and longer steroid taper.  Symptoms got some better but cough did not totally go away . Then she flew to [place] and came back 4 days ago, cough worse last 3 days . Cough is dry and aggravating .  Chest x-ray yesterday showed no acute process.    Also had dog bite , treated with Antibiotics.  Under a lot of stress with husband recent surgery .    Allergies  Allergen Reactions  . Alcohol   . Codeine     Patient prefers not to take this due to history of alcoholism  . Dilaudid [Hydromorphone Hcl]     Per patient she was told she had a reaction after surgery-reaction unknown-jaf    Immunization History  Administered Date(s) Administered  . Influenza Split 04/09/2012  . Influenza Whole 05/01/2007, 04/06/2008, 04/21/2009, 03/24/2010, 04/10/2011  . Influenza, High Dose Seasonal PF 03/17/2016, 04/16/2017, 03/20/2018  . Influenza,inj,Quad PF,6+ Mos 03/03/2013  . Influenza-Unspecified 04/09/2014, 04/09/2015, 03/20/2018  . Pneumococcal Conjugate-13 05/05/2015  . Pneumococcal Polysaccharide-23 04/09/2004  . Td 12/08/2005  . Tdap 12/22/2014  . Zoster 01/06/2008  . Zoster Recombinat (Shingrix) 08/01/2017, 10/03/2017    Past Medical History:  Diagnosis Date  . Alcoholism in remission  ([place]) 08/27/2007  . Anemia   . Arthritis    OA  . COPD 12/17/2006  . Diverticulosis 03/27/2007  . GERD 05/01/2007  . HYPERGLYCEMIA 08/27/2007  . HYPERLIPIDEMIA 12/17/2006  . HYPERTENSION 12/17/2006  . Mood disorder (HCC)    hx anxiety and depression  . Rectal polyp 03/27/2007   adenoma    Tobacco History: Social History   Tobacco Use  Smoking Status Former Smoker  . Packs/day: 2.00  . Years: 30.00  . Pack years: 60.00  . Types: Cigarettes  . Last attempt to quit: 07/10/1984  . Years since quitting: 34.1  Smokeless Tobacco Never Used   Counseling given: Not Answered   Outpatient Medications Prior to Visit  Medication Sig Dispense Refill  . acetaminophen ([employer]S ARTHRITIS PAIN RELIEF) 650 MG CR tablet Take 1,300 mg by mouth 2 (two) times daily.    Marland Kitchen albuterol (PROAIR HFA) 108 (90 Base) MCG/ACT inhaler INHALE 2 PUFFS EVERY 4 HOURS AS NEEDED FOR COUGHING SPELLS 8.5 Inhaler 2  . aspirin 81 MG tablet Take 81 mg by mouth daily.    . Coenzyme Q10 (COQ10 PO) Take by mouth daily.    . ferrous sulfate 325 (65 FE) MG tablet TAKE 1 TABLET BY MOUTH EVERY DAY 90 tablet 3  . fluticasone (FLONASE) 50 MCG/ACT nasal spray Place 2 sprays into both nostrils daily. 16 g 5  . fluticasone (FLOVENT HFA) 220 MCG/ACT inhaler Inhale 1 puff into the lungs 2 (two) times daily.  1 Inhaler 12  . hydrochlorothiazide (HYDRODIURIL) 25 MG tablet TAKE ONE TABLET BY MOUTH DAILY 90 tablet 1  . loratadine (CLARITIN) 10 MG tablet Take 1 tablet (10 mg total) by mouth daily. (Patient taking differently: Take 10 mg by mouth as needed. ) 90 tablet 3  . meclizine (ANTIVERT) 25 MG tablet Take 1 tablet (25 mg total) by mouth 3 (three) times daily as needed for dizziness. 30 tablet 0  . Multiple Vitamin (MULTIVITAMIN WITH MINERALS) TABS tablet Take 1 tablet by mouth daily.    . pantoprazole (PROTONIX) 40 MG tablet Take 1 tablet (40 mg total) by mouth daily. 90 tablet 3  . potassium chloride SA (K-DUR,KLOR-CON) 20 MEQ tablet TAKE 1  TABLET BY MOUTH DAILY 90 tablet 0  . ranitidine (ZANTAC) 300 MG tablet Take 1 tablet (300 mg total) by mouth at bedtime. 90 tablet 3  . simvastatin (ZOCOR) 20 MG tablet TAKE ONE TABLET BY MOUTH EVERY MORNING 90 tablet 2  . Vitamin D, Cholecalciferol, 1000 units CAPS Take 1 capsule by mouth daily.     No facility-administered medications prior to visit.      Review of Systems:   Constitutional:   No  weight loss, night sweats,  Fevers, chills, fatigue, or  lassitude.  HEENT:   No headaches,  Difficulty swallowing,  Tooth/dental problems, or  Sore throat,                No sneezing, itching, ear ache, nasal congestion, post nasal drip,   CV:  No chest pain,  Orthopnea, PND, swelling in lower extremities, anasarca, dizziness, palpitations, syncope.   GI  No heartburn, indigestion, abdominal pain, nausea, vomiting, diarrhea, change in bowel habits, loss of appetite, bloody stools.   Resp: No shortness of breath with exertion or at rest.  No excess mucus, no productive cough,  No non-productive cough,  No coughing up of blood.  No change in color of mucus.  No wheezing.  No chest wall deformity  Skin: no rash or lesions.  GU: no dysuria, change in color of urine, no urgency or frequency.  No flank pain, no hematuria   MS:  No joint pain or swelling.  No decreased range of motion.  No back pain.    Physical Exam  BP 112/76 (BP Location: Left Arm, Cuff Size: Large)   Pulse 73   Temp 98.1 F (36.7 C) (Oral)   Ht 5\' 6"  (1.676 m)   Wt 190 lb 6.4 oz (86.4 kg)   SpO2 93%   BMI 30.73 kg/m   GEN: A/Ox3; pleasant , NAD, well nourished    HEENT:  [place]/AT,  EACs-clear, TMs-wnl, NOSE-clear, THROAT-clear, no lesions, no postnasal drip or exudate noted.   NECK:  Supple w/ fair ROM; no JVD; normal carotid impulses w/o bruits; no thyromegaly or nodules palpated; no lymphadenopathy.    RESP  Clear  P & A; w/o, wheezes/ rales/ or rhonchi. no accessory muscle use, no dullness to  percussion  CARD:  RRR, no m/r/g, no peripheral edema, pulses intact, no cyanosis or clubbing.  GI:   Soft & nt; nml bowel sounds; no organomegaly or masses detected.   Musco: Warm bil, no deformities or joint swelling noted.   Neuro: alert, no focal deficits noted.    Skin: Warm, no lesions or rashes    Lab Results:  CBC    Component Value Date/Time   WBC 6.6 08/08/2018 0836   RBC 4.67 08/08/2018 0836   HGB 14.7 08/08/2018 0836  HCT 43.1 08/08/2018 0836   PLT 240.0 08/08/2018 0836   MCV 92.3 08/08/2018 0836   MCH 30.5 12/22/2014 1454   MCHC 34.0 08/08/2018 0836   RDW 13.8 08/08/2018 0836   LYMPHSABS 1.6 01/20/2016 0957   MONOABS 0.9 01/20/2016 0957   EOSABS 0.5 01/20/2016 0957   BASOSABS 0.0 01/20/2016 0957    BMET    Component Value Date/Time   NA 143 08/08/2018 0836   K 3.7 08/08/2018 0836   CL 103 08/08/2018 0836   CO2 31 08/08/2018 0836   GLUCOSE 97 08/08/2018 0836   BUN 13 08/08/2018 0836   CREATININE 0.73 08/08/2018 0836   CALCIUM 9.8 08/08/2018 0836   GFRNONAA >60 12/25/2014 0430   GFRAA >60 12/25/2014 0430    BNP No results found for: BNP  ProBNP    Component Value Date/Time   PROBNP 18.0 01/19/2012 0945    Imaging: Dg Chest 2 View  Result Date: 08/08/2018 CLINICAL DATA:  84 year old female with persistent cough. Former smoker. EXAM: CHEST - 2 VIEW COMPARISON:  Chest radiograph 01/23/2017 and earlier. FINDINGS: Lung volumes and mediastinal contours are stable and within normal limits. Calcified aortic atherosclerosis. Visualized tracheal air column is within normal limits. Stable mild increased interstitial markings with no pneumothorax, pleural effusion, pulmonary edema or confluent pulmonary opacity. No acute osseous abnormality identified. Negative visible bowel gas pattern. IMPRESSION: 1. No acute cardiopulmonary abnormality. 2.  Aortic Atherosclerosis (ICD10-I70.0). Electronically Signed   By: Genevie Ann M.D.   On: 08/08/2018 11:08   Dg Hand  Complete Right  Result Date: 07/28/2018 CLINICAL DATA:  Dog bite of right hand. EXAM: RIGHT HAND - COMPLETE 3+ VIEW COMPARISON:  None. FINDINGS: There is no evidence of fracture or dislocation. Degenerative joint changes with narrowed joint space and osteophyte formation are identified in multiple digits and in the carpal bones. Soft tissues are unremarkable. IMPRESSION: No acute abnormality identified. No radiopaque foreign body identified. Electronically Signed   By: Abelardo Diesel M.D.   On: 07/28/2018 15:04    levalbuterol (XOPENEX) nebulizer solution 0.63 mg    Date Action Dose Route User   08/09/2018 1718 Given 0.63 mg Nebulization Rinaldo Ratel, CMA      PFT Results Latest Ref Rng & Units 10/09/2013  FVC-Pre L 2.62  FVC-Predicted Pre % 91  FVC-Post L 2.68  FVC-Predicted Post % 93  Pre FEV1/FVC % % 71  Post FEV1/FCV % % 70  FEV1-Pre L 1.86  FEV1-Predicted Pre % 86  FEV1-Post L 1.89  DLCO UNC% % 48  DLCO COR %Predicted % 67  TLC L 4.13  TLC % Predicted % 79  RV % Predicted % 64    No results found for: NITRICOXIDE      Assessment & Plan:   COPD (chronic obstructive pulmonary disease) (HCC) Resolving flare with suspected post viral cough  Hold on additional abx or steroids at this time  tx cough and control for triggers    Patient Instructions  Continue on Claritin 10mg  daily  Add Delsym 2 tsp Twice daily  As needed  Cough  Add Tessalon Three times a day  As needed for cough  Continue on Zantac daily .  May use Asmanex  2 puffs Twice daily  , rinse after use. Take until sample is gone.  Albuterol Inhaler 2 puffs every 4hr as needed. Wheezing  follow up Dr. Lamonte Sakai  In 6 weeks and As needed   Please contact office for sooner follow up if symptoms do not  improve or worsen or seek emergency care          Rexene Edison, NP 08/09/2018

## 2018-08-09 NOTE — Telephone Encounter (Signed)
Called and spoke with Patient.  Tammy P,  told her she was sending her Tessalon to [facility] pharmacy today.  Prescription not sent at this time.    Per 08/09/18, OV Continue on Claritin 10mg  daily  Add Delsym 2 tsp Twice daily  As needed  Cough  Add Tessalon Three times a day  As needed for cough  Continue on Zantac daily .  QVAR 35mcg 2 puffs Twice daily  , rinse after use. Take until sample is gone.  Albuterol Inhaler 2 puffs every 4hr as needed. Wheezing  follow up Dr. Lamonte Sakai  In 6 weeks and As needed   Please contact office for sooner follow up if symptoms do not improve or worsen or seek emergency care   Tammy P, NP, please advise on Tessalon

## 2018-08-09 NOTE — Patient Instructions (Addendum)
Continue on Claritin 10mg  daily  Add Delsym 2 tsp Twice daily  As needed  Cough  Add Tessalon Three times a day  As needed for cough  Continue on Zantac daily .  May use Asmanex  2 puffs Twice daily  , rinse after use. Take until sample is gone.  Albuterol Inhaler 2 puffs every 4hr as needed. Wheezing  follow up Dr. Lamonte Sakai  In 6 weeks and As needed   Please contact office for sooner follow up if symptoms do not improve or worsen or seek emergency care

## 2018-08-13 NOTE — Telephone Encounter (Signed)
I called the pt and she stated she was seen by the pulmonary doctor.  Also given a different Rx and sample.

## 2018-09-09 DIAGNOSIS — R69 Illness, unspecified: Secondary | ICD-10-CM | POA: Diagnosis not present

## 2018-09-12 ENCOUNTER — Other Ambulatory Visit: Payer: Self-pay | Admitting: Family Medicine

## 2018-09-12 DIAGNOSIS — Z1231 Encounter for screening mammogram for malignant neoplasm of breast: Secondary | ICD-10-CM

## 2018-09-18 ENCOUNTER — Encounter: Payer: Self-pay | Admitting: Family Medicine

## 2018-09-19 DIAGNOSIS — R51 Headache: Secondary | ICD-10-CM | POA: Diagnosis not present

## 2018-09-19 DIAGNOSIS — Z961 Presence of intraocular lens: Secondary | ICD-10-CM | POA: Diagnosis not present

## 2018-09-19 DIAGNOSIS — H26493 Other secondary cataract, bilateral: Secondary | ICD-10-CM | POA: Diagnosis not present

## 2018-09-19 DIAGNOSIS — H43813 Vitreous degeneration, bilateral: Secondary | ICD-10-CM | POA: Diagnosis not present

## 2018-09-20 ENCOUNTER — Ambulatory Visit: Payer: Self-pay | Admitting: Emergency Medicine

## 2018-09-20 ENCOUNTER — Encounter: Payer: Self-pay | Admitting: Family Medicine

## 2018-09-24 ENCOUNTER — Ambulatory Visit: Payer: Medicare HMO | Admitting: Emergency Medicine

## 2018-09-24 ENCOUNTER — Other Ambulatory Visit: Payer: Self-pay

## 2018-09-24 ENCOUNTER — Encounter: Payer: Self-pay | Admitting: Emergency Medicine

## 2018-09-24 DIAGNOSIS — R69 Illness, unspecified: Secondary | ICD-10-CM | POA: Diagnosis not present

## 2018-09-24 DIAGNOSIS — J301 Allergic rhinitis due to pollen: Secondary | ICD-10-CM

## 2018-09-24 DIAGNOSIS — K219 Gastro-esophageal reflux disease without esophagitis: Secondary | ICD-10-CM | POA: Diagnosis not present

## 2018-09-24 DIAGNOSIS — J441 Chronic obstructive pulmonary disease with (acute) exacerbation: Secondary | ICD-10-CM

## 2018-09-24 MED ORDER — FLUTICASONE PROPIONATE 50 MCG/ACT NA SUSP: 2.0000 | Freq: Every day | NASAL | 2 refills | Status: DC

## 2018-09-24 NOTE — Assessment & Plan Note (Signed)
She had a bout of cough following a URI at the beginning of the year, was treated with antibiotics, steroids.  She has slowly improved and 1 of the things that helped her was a brief course of Asmanex, now completed.  Cough is back to baseline.  She is treating allergic rhinitis and reflux.  She has been taking Tessalon on a schedule, I recommended that she change this to every 6 hours as needed for cough.

## 2018-09-24 NOTE — Patient Instructions (Signed)
Please continue Claritin 10 mg daily Continue fluticasone nasal spray (Flonase), 2 sprays each nostril once daily. Please continue Zantac and pantoprazole (Protonix) as you have been taking them. Continue to keep your albuterol available to use 2 puffs if needed for shortness of breath, chest tightness, wheezing. Keep Ladona Ridgel available to use up to every 6 hours if you need it for cough suppression.  You do not have to take this on a schedule. Follow with Dr Lamonte Sakai in 6 months or sooner if you have any problems

## 2018-09-24 NOTE — Assessment & Plan Note (Signed)
Mild obstruction, no schedule bronchodilator.  She will continue to keep albuterol available to use as needed

## 2018-09-24 NOTE — Assessment & Plan Note (Signed)
She is not having any breakthrough GERD symptoms currently, tolerating daily PPI and Zantac.  Continue same regimen.

## 2018-09-24 NOTE — Progress Notes (Signed)
Subjective:    Patient ID: Sheila Shields, female    DOB: Nov 27, 1934, 84 y.o.   MRN: [id number]  COPD  She complains of cough and shortness of breath. There is no wheezing. Pertinent negatives include no ear pain, fever, headaches, myalgias, postnasal drip, rhinorrhea, sneezing, sore throat or trouble swallowing. Her past medical history is significant for COPD.   84 year old former smoker (60 pack years) with a history of hypertension, hyperlipidemia, GERD and COPD that was dx by PFT in 10/2013 > mild AFL. She is referred for evaluation of chronic and persistent cough.   Pulmonary function testing from 10/09/13 in full which showed mild obstruction and an FEV1 of 86% of predicted.   ROV 01/05/17 -- Sheila Shields is 84 with a history of former tobacco use. She has mild COPD based on mild obstruction on spirometry. She also has an irritable upper airway with chronic cough in the setting of GERD and chronic rhinitis. She has been well, no flares of her cough. Then 3 weeks ago had a lot of PND, possible URI sx, a lot of cough. She was treated with pred and then doxycycline, will finish it tonight. She is doing better. She is on loratadine, chlorpheniramine bid. She has SABA, uses it rarely unless she flares.   Acute OV 11/09/17 --acute visit for patient with a history of mild COPD based on spirometry, irritable upper airway with chronic cough.  She has GERD and chronic rhinitis as well.  She presents today reporting that she has been experiencing off and on cough, also some dyspnea. Both can happen w exertion. She can wake from sleep w cough. She is still on PPI oin the am, zantac at night. She has stable nasal congestion. She is on loratadine, has not used astelin, atrovent NS. She uses flonase prn. She uses albuterol about 3-4x a week. She was treated for a possible AE a month ago that manifested itself as cough, with pred + an abx.   She knows now that she has been exposed to some water damaged tile, mold -  now being repaired.   ROV 09/24/2018 --follow-up visit for 84 year old woman with a history of mild COPD confirmed on spirometry, upper airway irritability syndrome with chronic cough in the setting of GERD and chronic rhinitis.  She was seen here acutely 08/09/2018 with an increase in her cough following an upper respiratory infection.  She was treated with prednisone and doxycycline, chest x-ray reassuring.  She was continued on Claritin, flonase, Zantac, protonix, added Tessalon as needed and also a brief course of Asmanex, now off. She is on albuterol prn > uses rarely. She wonders about changing the tessalon to prn.     Review of Systems  Constitutional: Negative for fever and unexpected weight change.  HENT: Negative for congestion, dental problem, ear pain, nosebleeds, postnasal drip, rhinorrhea, sinus pressure, sneezing, sore throat and trouble swallowing.   Eyes: Negative for redness and itching.  Respiratory: Positive for cough and shortness of breath. Negative for chest tightness and wheezing.   Cardiovascular: Negative for palpitations and leg swelling.  Gastrointestinal: Negative for nausea and vomiting.  Genitourinary: Negative for dysuria.  Musculoskeletal: Negative for arthralgias, joint swelling and myalgias.  Skin: Negative for rash.  Neurological: Negative for headaches.  Hematological: Does not bruise/bleed easily.  Psychiatric/Behavioral: Negative for dysphoric mood. The patient is not nervous/anxious.     Past Medical History:  Diagnosis Date  . Alcoholism in remission ([place]) 08/27/2007  . Anemia   .  Arthritis    OA  . COPD 12/17/2006  . Diverticulosis 03/27/2007  . GERD 05/01/2007  . HYPERGLYCEMIA 08/27/2007  . HYPERLIPIDEMIA 12/17/2006  . HYPERTENSION 12/17/2006  . Mood disorder (HCC)    hx anxiety and depression  . Rectal polyp 03/27/2007   adenoma     Family History  Problem Relation Age of Onset  . Throat cancer Mother   . Heart attack Father 95  . Idiopathic  pulmonary fibrosis Brother 10  . Cancer Brother        sarcoma  . Heart disease Brother   . Colon cancer Neg Hx   . Esophageal cancer Neg Hx   . Stomach cancer Neg Hx      Social History   Socioeconomic History  . Marital status: Married    Spouse name: Not on file  . Number of children: 2  . Years of education: Not on file  . Highest education level: Not on file  Occupational History  . Occupation: retired  Scientific laboratory technician  . Financial resource strain: Not on file  . Food insecurity:    Worry: Not on file    Inability: Not on file  . Transportation needs:    Medical: Not on file    Non-medical: Not on file  Tobacco Use  . Smoking status: Former Smoker    Packs/day: 2.00    Years: 30.00    Pack years: 60.00    Types: Cigarettes    Last attempt to quit: 07/10/1984    Years since quitting: 34.2  . Smokeless tobacco: Never Used  Substance and Sexual Activity  . Alcohol use: No    Alcohol/week: 0.0 standard drinks    Comment: no alcoho since 1983  . Drug use: No  . Sexual activity: Not on file  Lifestyle  . Physical activity:    Days per week: Not on file    Minutes per session: Not on file  . Stress: Not on file  Relationships  . Social connections:    Talks on phone: Not on file    Gets together: Not on file    Attends religious service: Not on file    Active member of club or organization: Not on file    Attends meetings of clubs or organizations: Not on file    Relationship status: Not on file  . Intimate partner violence:    Fear of current or ex partner: Not on file    Emotionally abused: Not on file    Physically abused: Not on file    Forced sexual activity: Not on file  Other Topics Concern  . Not on file  Social History Narrative   Work or School: very active in [employer] Situation: lives with husband      Spiritual Beliefs: Christian      Lifestyle:tries to walk; diet ok - denies any alcohol or tobacco use in many many years         Allergies  Allergen Reactions  . Alcohol   . Codeine     Patient prefers not to take this due to history of alcoholism  . Dilaudid [Hydromorphone Hcl]     Per patient she was told she had a reaction after surgery-reaction unknown-jaf     Outpatient Medications Prior to Visit  Medication Sig Dispense Refill  . acetaminophen ([employer] ARTHRITIS PAIN RELIEF) 650 MG CR tablet Take 1,300 mg by mouth 2 (two) times daily.    Marland Kitchen albuterol (  PROAIR HFA) 108 (90 Base) MCG/ACT inhaler INHALE 2 PUFFS EVERY 4 HOURS AS NEEDED FOR COUGHING SPELLS 8.5 Inhaler 2  . aspirin 81 MG tablet Take 81 mg by mouth daily.    . benzonatate (TESSALON) 200 MG capsule Take 1 capsule (200 mg total) by mouth 3 (three) times daily as needed for cough. 45 capsule 3  . Coenzyme Q10 (COQ10 PO) Take by mouth daily.    . ferrous sulfate 325 (65 FE) MG tablet TAKE 1 TABLET BY MOUTH EVERY DAY 90 tablet 3  . hydrochlorothiazide (HYDRODIURIL) 25 MG tablet TAKE ONE TABLET BY MOUTH DAILY 90 tablet 1  . loratadine (CLARITIN) 10 MG tablet Take 1 tablet (10 mg total) by mouth daily. (Patient taking differently: Take 10 mg by mouth as needed. ) 90 tablet 3  . meclizine (ANTIVERT) 25 MG tablet Take 1 tablet (25 mg total) by mouth 3 (three) times daily as needed for dizziness. 30 tablet 0  . Multiple Vitamin (MULTIVITAMIN WITH MINERALS) TABS tablet Take 1 tablet by mouth daily.    . pantoprazole (PROTONIX) 40 MG tablet Take 1 tablet (40 mg total) by mouth daily. 90 tablet 3  . potassium chloride SA (K-DUR,KLOR-CON) 20 MEQ tablet TAKE 1 TABLET BY MOUTH DAILY 90 tablet 0  . ranitidine (ZANTAC) 300 MG tablet Take 1 tablet (300 mg total) by mouth at bedtime. 90 tablet 3  . simvastatin (ZOCOR) 20 MG tablet TAKE ONE TABLET BY MOUTH EVERY MORNING 90 tablet 2  . Vitamin D, Cholecalciferol, 1000 units CAPS Take 1 capsule by mouth daily.    . fluticasone (FLOVENT HFA) 220 MCG/ACT inhaler Inhale 1 puff into the lungs 2 (two) times daily. 1 Inhaler 12  .  Mometasone Furoate (ASMANEX HFA) 200 MCG/ACT AERO Inhale 2 puffs into the lungs 2 (two) times daily. 1 Inhaler 0  . fluticasone (FLONASE) 50 MCG/ACT nasal spray Place 2 sprays into both nostrils daily. 16 g 5   No facility-administered medications prior to visit.         Objective:   Physical Exam Vitals:   09/24/18 0958  BP: 132/84  Pulse: 64  SpO2: 91%  Weight: 188 lb (85.3 kg)  Height: 5\' 6"  (1.676 m)   Gen: Pleasant, well-nourished, in no distress,  normal affect  ENT: No lesions, mouth clear, a bit dry, no erythema  Neck: No JVD, no stridor  Lungs: No use of accessory muscles, clear without rales or rhonchi  Cardiovascular: RRR, heart sounds normal, no murmur or gallops, no peripheral edema  Musculoskeletal: warm and well perfused  Neuro: alert, non focal  Skin: Warm, no lesions or rashes      Assessment & Plan:  Chronic cough She had a bout of cough following a URI at the beginning of the year, was treated with antibiotics, steroids.  She has slowly improved and 1 of the things that helped her was a brief course of Asmanex, now completed.  Cough is back to baseline.  She is treating allergic rhinitis and reflux.  She has been taking Tessalon on a schedule, I recommended that she change this to every 6 hours as needed for cough.  Allergic rhinitis Continue loratadine, fluticasone nasal spray  COPD (chronic obstructive pulmonary disease) (HCC) Mild obstruction, no schedule bronchodilator.  She will continue to keep albuterol available to use as needed  Gastroesophageal reflux disease She is not having any breakthrough GERD symptoms currently, tolerating daily PPI and Zantac.  Continue same regimen.  Baltazar Apo, MD, PhD 09/24/2018,  10:30 AM [facility] [phone] or if no answer [phone]

## 2018-09-24 NOTE — Assessment & Plan Note (Signed)
Continue loratadine, fluticasone nasal spray

## 2018-10-08 ENCOUNTER — Encounter: Payer: Self-pay | Admitting: Family Medicine

## 2018-10-10 NOTE — Progress Notes (Signed)
Virtual Visit via Video   I connected with Sheila Shields on 10/11/18 at  9:00 AM EDT by a video enabled telemedicine application and verified that I am speaking with the correct person using two identifiers. Location patient: Home Location provider: [facility], Office Persons participating in the virtual visit: Tezra, Mahr, DO [name], CMA acting as scribe for Dr. Briscoe Deutscher.    I discussed the limitations of evaluation and management by telemedicine and the availability of in person appointments. The patient expressed understanding and agreed to proceed.  Subjective:   HPI: Patient did a news story on people dealing with recovery while in quarantine. She was blurred out to protect her identity.   She has had increased trouble with COPB over the last few days due to allergies. She has been maintaining symptoms with current mediations.  She is currently taking Asmanex  for two weeks with the tessalon and helped get cough under control. She has had increased cough after stopping Tessalon. She is taking Flonase with allergies. If she feels she needs more she will call office and we can call in singular. She will go back on the Asmanex for two more weeks to help with symptoms.   Reviewed all precautions and expectations with prevention of Covid-19.   ROS: See pertinent positives and negatives per HPI.  Patient Active Problem List   Diagnosis Date Noted  . Aortic atherosclerosis ([place]) 04/11/2018  . Allergic rhinitis 11/09/2017  . Arm mass, left 04/16/2017  . Chronic cough 10/11/2015  . COPD (chronic obstructive pulmonary disease) ([place]) 11/18/2014  . Iron deficiency anemia, unspecified 01/20/2011  . Arthropathy 02/23/2010  . HYPERGLYCEMIA 08/27/2007  . Gastroesophageal reflux disease 05/01/2007  . Hyperlipemia 12/17/2006  . Essential hypertension 12/17/2006    Past Surgical History:  Procedure Laterality Date  . ABDOMINAL HYSTERECTOMY  1978   fibroma    . APPENDECTOMY  2002  . BREAST BIOPSY     negative  . BREAST EXCISIONAL BIOPSY Right    2003  . CARPAL TUNNEL RELEASE Left 2010 or 2011  . COLONOSCOPY W/ POLYPECTOMY  03/27/2007; n7/19/2012   2008: 4 mm adenoma, diverticulosis 2012: ileitis ? NSAID - likely, 2-3 mm cecal polyp LYMPHOID FOLLICLE, diverticulosis  . ESOPHAGOGASTRODUODENOSCOPY  01/26/2011   reflux esophagitis, colonscopy done also  . HAMMER TOE SURGERY  2008   left foot  . HERNIA REPAIR Right 1996?  . ORIF ANKLE FRACTURE Right 12/22/2014   Procedure: OPEN REDUCTION INTERNAL FIXATION (ORIF) ANKLE FRACTURE;  Surgeon: Susa Day, MD;  Location: [facility];  Service: Orthopedics;  Laterality: Right;  . SHOULDER OPEN ROTATOR CUFF REPAIR Left 03/06/2013   Procedure: LEFT ROTATOR CUFF REPAIR, SUBACROMIAL DECOMPRESSION, PATCH GRAFT, MANIPULATION UNDER ANESTHESIA;  Surgeon: Johnn Hai, MD;  Location: [facility];  Service: Orthopedics;  Laterality: Left;   Family History  Problem Relation Age of Onset  . Throat cancer Mother   . Heart attack Father 24  . Idiopathic pulmonary fibrosis Brother 54  . Cancer Brother        sarcoma  . Heart disease Brother   . Colon cancer Neg Hx   . Esophageal cancer Neg Hx   . Stomach cancer Neg Hx    Social History   Tobacco Use  . Smoking status: Former Smoker    Packs/day: 2.00    Years: 30.00    Pack years: 60.00    Types: Cigarettes    Last attempt to quit: 07/10/1984  Years since quitting: 34.2  . Smokeless tobacco: Never Used  Substance Use Topics  . Alcohol use: No    Alcohol/week: 0.0 standard drinks    Comment: no alcoho since 1983   Current Outpatient Medications:  .  acetaminophen ([employer]S ARTHRITIS PAIN RELIEF) 650 MG CR tablet, Take 1,300 mg by mouth 2 (two) times daily., Disp: , Rfl:  .  albuterol (PROAIR HFA) 108 (90 Base) MCG/ACT inhaler, INHALE 2 PUFFS EVERY 4 HOURS AS NEEDED FOR COUGHING SPELLS, Disp: 8.5 Inhaler, Rfl: 2 .  aspirin 81 MG tablet, Take 81 mg by mouth  daily., Disp: , Rfl:  .  benzonatate (TESSALON) 200 MG capsule, Take 1 capsule (200 mg total) by mouth 3 (three) times daily as needed for cough., Disp: 45 capsule, Rfl: 3 .  Coenzyme Q10 (COQ10 PO), Take by mouth daily., Disp: , Rfl:  .  ferrous sulfate 325 (65 FE) MG tablet, TAKE 1 TABLET BY MOUTH EVERY DAY, Disp: 90 tablet, Rfl: 3 .  fluticasone (FLONASE) 50 MCG/ACT nasal spray, Place 2 sprays into both nostrils daily., Disp: 16 g, Rfl: 2 .  hydrochlorothiazide (HYDRODIURIL) 25 MG tablet, TAKE ONE TABLET BY MOUTH DAILY, Disp: 90 tablet, Rfl: 1 .  loratadine (CLARITIN) 10 MG tablet, Take 1 tablet (10 mg total) by mouth daily. (Patient taking differently: Take 10 mg by mouth as needed. ), Disp: 90 tablet, Rfl: 3 .  meclizine (ANTIVERT) 25 MG tablet, Take 1 tablet (25 mg total) by mouth 3 (three) times daily as needed for dizziness., Disp: 30 tablet, Rfl: 0 .  Multiple Vitamin (MULTIVITAMIN WITH MINERALS) TABS tablet, Take 1 tablet by mouth daily., Disp: , Rfl:  .  pantoprazole (PROTONIX) 40 MG tablet, Take 1 tablet (40 mg total) by mouth daily., Disp: 90 tablet, Rfl: 3 .  potassium chloride SA (K-DUR,KLOR-CON) 20 MEQ tablet, TAKE 1 TABLET BY MOUTH DAILY, Disp: 90 tablet, Rfl: 0 .  ranitidine (ZANTAC) 300 MG tablet, Take 1 tablet (300 mg total) by mouth at bedtime., Disp: 90 tablet, Rfl: 3 .  simvastatin (ZOCOR) 20 MG tablet, TAKE ONE TABLET BY MOUTH EVERY MORNING, Disp: 90 tablet, Rfl: 2 .  Vitamin D, Cholecalciferol, 1000 units CAPS, Take 1 capsule by mouth daily., Disp: , Rfl:   Allergies  Allergen Reactions  . Alcohol   . Codeine     Patient prefers not to take this due to history of alcoholism  . Dilaudid [Hydromorphone Hcl]     Per patient she was told she had a reaction after surgery-reaction unknown-jaf    Objective:   VITALS: Per patient if applicable, see vitals. GENERAL: Alert, appears well and in no acute distress. HEENT: Atraumatic, conjunctiva clear, no obvious abnormalities  on inspection of external nose and ears. NECK: Normal movements of the head and neck. CARDIOPULMONARY: No increased WOB. Speaking in clear sentences. Mild wheeze with cough. MS: Moves all visible extremities without noticeable abnormality. PSYCH: Pleasant and cooperative, well-groomed. Speech normal rate and rhythm. Affect is appropriate. Insight and judgement are appropriate. Attention is focused, linear, and appropriate.  NEURO: CN grossly intact. Oriented as arrived to appointment on time with no prompting. Moves both UE equally.  SKIN: No obvious lesions, wounds, erythema, or cyanosis noted on face or hands.  Assessment and Plan:   Sheila Shields was seen today for follow-up.  Diagnoses and all orders for this visit:  Seasonal allergic rhinitis due to pollen Comments: Worsened. Offered Singulair if needed.  Chronic cough Comments: Worsened. Maximize antihistamine, flonase. Restart Asthmanex.  Continue tessalon.   Simple chronic bronchitis ([place]) Comments: With acute exacerbation.   Essential hypertension Comments: Elevated when checked during visit. She will check again and send via mychart.    . Reviewed expectations re: course of current medical issues. . Discussed self-management of symptoms. . Outlined signs and symptoms indicating need for more acute intervention. . Patient verbalized understanding and all questions were answered. Marland Kitchen Health Maintenance issues including appropriate healthy diet, exercise, and smoking avoidance were discussed with patient. . See orders for this visit as documented in the electronic medical record.  Briscoe Deutscher, DO 10/11/2018

## 2018-10-11 ENCOUNTER — Other Ambulatory Visit: Payer: Self-pay

## 2018-10-11 ENCOUNTER — Ambulatory Visit (INDEPENDENT_AMBULATORY_CARE_PROVIDER_SITE_OTHER): Payer: Medicare HMO | Admitting: Family Medicine

## 2018-10-11 ENCOUNTER — Encounter: Payer: Self-pay | Admitting: Family Medicine

## 2018-10-11 VITALS — HR 83 | Temp 97.3°F | Ht 66.0 in | Wt 188.0 lb

## 2018-10-11 DIAGNOSIS — I10 Essential (primary) hypertension: Secondary | ICD-10-CM

## 2018-10-11 DIAGNOSIS — J301 Allergic rhinitis due to pollen: Secondary | ICD-10-CM

## 2018-10-11 DIAGNOSIS — J410 Simple chronic bronchitis: Secondary | ICD-10-CM

## 2018-10-11 DIAGNOSIS — R05 Cough: Secondary | ICD-10-CM

## 2018-10-11 DIAGNOSIS — R053 Chronic cough: Secondary | ICD-10-CM

## 2018-10-21 ENCOUNTER — Ambulatory Visit (INDEPENDENT_AMBULATORY_CARE_PROVIDER_SITE_OTHER)
Admission: RE | Admit: 2018-10-21 | Discharge: 2018-10-21 | Disposition: A | Discharge status: Home / Self Care | Payer: Medicare HMO | Source: Ambulatory Visit | Attending: Family Medicine | Admitting: Family Medicine

## 2018-10-21 ENCOUNTER — Encounter: Payer: Self-pay | Admitting: Family Medicine

## 2018-10-21 ENCOUNTER — Other Ambulatory Visit: Payer: Self-pay

## 2018-10-21 ENCOUNTER — Ambulatory Visit (INDEPENDENT_AMBULATORY_CARE_PROVIDER_SITE_OTHER): Payer: Medicare HMO | Admitting: Family Medicine

## 2018-10-21 VITALS — Ht 66.0 in | Wt 188.0 lb

## 2018-10-21 DIAGNOSIS — R29818 Other symptoms and signs involving the nervous system: Secondary | ICD-10-CM | POA: Diagnosis not present

## 2018-10-21 DIAGNOSIS — G4489 Other headache syndrome: Secondary | ICD-10-CM | POA: Diagnosis not present

## 2018-10-21 DIAGNOSIS — E782 Mixed hyperlipidemia: Secondary | ICD-10-CM

## 2018-10-21 DIAGNOSIS — I10 Essential (primary) hypertension: Secondary | ICD-10-CM

## 2018-10-21 DIAGNOSIS — Z87898 Personal history of other specified conditions: Secondary | ICD-10-CM | POA: Diagnosis not present

## 2018-10-21 DIAGNOSIS — R51 Headache: Secondary | ICD-10-CM | POA: Diagnosis not present

## 2018-10-21 NOTE — Telephone Encounter (Signed)
Spoke with patient. She notes that she has discussed similar symptoms with Dr. Juleen China in the past. She denies slurred speech, face drooping, visual changes, nausea/vomting. She c/o numbness on the left side of her face which didn't last very long, then this progressed to moderate cramping in her L hand which prevented her from being about to move her hand very much, this has since resolved. She c/o mild constant dull "sinus" HA. She denies dizziness but does note that she occasionally feels off balance, a "drunk" kind of feeling.

## 2018-10-21 NOTE — Telephone Encounter (Signed)
Questions for Screening COVID-19  Symptom onset: No cough, fever.   Travel or Contacts: No travel or contact with anyone positive or suspected to have Covid-19.   During this illness, did/does the patient experience any of the following symptoms? Fever >100.38F []   Yes [x]   No []   Unknown Subjective fever (felt feverish) []   Yes [x]   No []   Unknown Chills []   Yes [x]   No []   Unknown Muscle aches (myalgia) [x]   Yes []   No []   Unknown Runny nose (rhinorrhea) []   Yes [x]   No []   Unknown Sore throat []   Yes [x]   No []   Unknown Cough (new onset or worsening of chronic cough) []   Yes [x]   No []   Unknown Shortness of breath (dyspnea) []   Yes [x]   No []   Unknown (SOB w/ exertion, COPD) Nausea or vomiting []   Yes [x]   No []   Unknown Headache []   Yes [x]   No []   Unknown Abdominal pain  []   Yes [x]   No []   Unknown Diarrhea (?3 loose/looser than normal stools/24hr period) []   Yes [x]   No []   Unknown Other, specify:  Patient risk factors: Smoker? []   Current [x]   Former []   Never If female, currently pregnant? []   Yes [x]   No  Patient Active Problem List   Diagnosis Date Noted  . Aortic atherosclerosis ([place]) 04/11/2018  . Allergic rhinitis 11/09/2017  . Arm mass, left 04/16/2017  . Chronic cough 10/11/2015  . COPD (chronic obstructive pulmonary disease) ([place]) 11/18/2014  . Iron deficiency anemia, unspecified 01/20/2011  . Arthropathy 02/23/2010  . HYPERGLYCEMIA 08/27/2007  . Gastroesophageal reflux disease 05/01/2007  . Hyperlipemia 12/17/2006  . Essential hypertension 12/17/2006    Plan:  []   High risk for COVID-19 with red flags go to ED (with CP, SOB, weak/lightheaded, or fever > 101.5). Call ahead.  []   High risk for COVID-19 but stable. Inform provider and coordinate time for [facility] visit.   []   No red flags but URI signs or symptoms okay for [facility] visit.

## 2018-10-21 NOTE — Telephone Encounter (Addendum)
CT scan has been scheduled for today at 12:00 PM at [facility].

## 2018-10-21 NOTE — Telephone Encounter (Signed)
STAT referral to neurology and STAT Ct order placed.

## 2018-10-21 NOTE — Addendum Note (Signed)
Addended by: Briscoe Deutscher R on: 10/21/2018 10:42 AM   Modules accepted: Orders

## 2018-10-21 NOTE — Progress Notes (Deleted)
Virtual Visit via Video   I connected with Sheila Shields on 10/27/18 at  1:40 PM EDT by a video enabled telemedicine application and verified that I am speaking with the correct person using two identifiers. Location patient: Home Location provider: [facility], Office Persons participating in the virtual visit: DONNETTE MACMULLEN, Briscoe Deutscher, DO, [name], as CMA scribe.   I discussed the limitations of evaluation and management by telemedicine and the availability of in person appointments. The patient expressed understanding and agreed to proceed.  Subjective:   HPI: 84 year old with HTN, HLD, ocular migraines, alcoholism in remission. Complained of increased frequency of ocular migraines at last visit. Last week, she developed a headache, left side. On Sunday, she had a five minute episode of left facial numbness. About an hour later, her left hand became numb and weak, for another five minutes.  No vision changes, dizziness, trouble speaking or swallowing. She called with symptoms that morning. CT head without contrast ordered STAT. Showed mild chronic small vessel ischemic changes and atrophy but no acute process.   Lab Results  Component Value Date   CHOL 173 08/08/2018   HDL 43.60 08/08/2018   LDLCALC 98 08/08/2018   LDLDIRECT 86.0 08/21/2017   TRIG 156.0 (H) 08/08/2018   CHOLHDL 4 08/08/2018   BP Readings from Last 3 Encounters:  09/24/18 132/84  08/09/18 112/76  08/08/18 110/72   ROS: See pertinent positives and negatives per HPI.  Patient Active Problem List   Diagnosis Date Noted  . Aortic atherosclerosis ([place]) 04/11/2018  . Allergic rhinitis 11/09/2017  . Arm mass, left 04/16/2017  . Chronic cough 10/11/2015  . COPD (chronic obstructive pulmonary disease) ([place]) 11/18/2014  . Iron deficiency anemia, unspecified 01/20/2011  . Arthropathy 02/23/2010  . HYPERGLYCEMIA 08/27/2007  . Gastroesophageal reflux disease 05/01/2007  . Hyperlipemia 12/17/2006  .  Essential hypertension 12/17/2006    Social History   Tobacco Use  . Smoking status: Former Smoker    Packs/day: 2.00    Years: 30.00    Pack years: 60.00    Types: Cigarettes    Last attempt to quit: 07/10/1984    Years since quitting: 34.3  . Smokeless tobacco: Never Used  Substance Use Topics  . Alcohol use: No    Alcohol/week: 0.0 standard drinks    Comment: no alcoho since 1983    Current Outpatient Medications:  .  acetaminophen ([employer] ARTHRITIS PAIN RELIEF) 650 MG CR tablet, Take 1,300 mg by mouth 2 (two) times daily., Disp: , Rfl:  .  albuterol (PROAIR HFA) 108 (90 Base) MCG/ACT inhaler, INHALE 2 PUFFS EVERY 4 HOURS AS NEEDED FOR COUGHING SPELLS, Disp: 8.5 Inhaler, Rfl: 2 .  aspirin 81 MG tablet, Take 81 mg by mouth daily., Disp: , Rfl:  .  benzonatate (TESSALON) 200 MG capsule, Take 1 capsule (200 mg total) by mouth 3 (three) times daily as needed for cough., Disp: 45 capsule, Rfl: 3 .  Coenzyme Q10 (COQ10 PO), Take by mouth daily., Disp: , Rfl:  .  ferrous sulfate 325 (65 FE) MG tablet, TAKE 1 TABLET BY MOUTH EVERY DAY, Disp: 90 tablet, Rfl: 3 .  fluticasone (FLONASE) 50 MCG/ACT nasal spray, Place 2 sprays into both nostrils daily., Disp: 16 g, Rfl: 2 .  hydrochlorothiazide (HYDRODIURIL) 25 MG tablet, TAKE ONE TABLET BY MOUTH DAILY, Disp: 90 tablet, Rfl: 1 .  loratadine (CLARITIN) 10 MG tablet, Take 1 tablet (10 mg total) by mouth daily. (Patient taking differently: Take 10  mg by mouth as needed. ), Disp: 90 tablet, Rfl: 3 .  meclizine (ANTIVERT) 25 MG tablet, Take 1 tablet (25 mg total) by mouth 3 (three) times daily as needed for dizziness., Disp: 30 tablet, Rfl: 0 .  Multiple Vitamin (MULTIVITAMIN WITH MINERALS) TABS tablet, Take 1 tablet by mouth daily., Disp: , Rfl:  .  pantoprazole (PROTONIX) 40 MG tablet, Take 1 tablet (40 mg total) by mouth daily., Disp: 90 tablet, Rfl: 3 .  potassium chloride SA (K-DUR,KLOR-CON) 20 MEQ tablet, TAKE 1 TABLET BY MOUTH DAILY, Disp: 90  tablet, Rfl: 0 .  ranitidine (ZANTAC) 300 MG tablet, Take 1 tablet (300 mg total) by mouth at bedtime., Disp: 90 tablet, Rfl: 3 .  simvastatin (ZOCOR) 20 MG tablet, TAKE ONE TABLET BY MOUTH EVERY MORNING, Disp: 90 tablet, Rfl: 2 .  Vitamin D, Cholecalciferol, 1000 units CAPS, Take 1 capsule by mouth daily., Disp: , Rfl:   Allergies  Allergen Reactions  . Alcohol   . Codeine     Patient prefers not to take this due to history of alcoholism  . Dilaudid [Hydromorphone Hcl]     Per patient she was told she had a reaction after surgery-reaction unknown-jaf    Objective:   VITALS: Per patient if applicable, see vitals. GENERAL: Alert, appears well and in no acute distress. HEENT: Atraumatic, conjunctiva clear, no obvious abnormalities on inspection of external nose and ears. NECK: Normal movements of the head and neck. CARDIOPULMONARY: No increased WOB. Speaking in clear sentences. I:E ratio WNL.  MS: Moves all visible extremities without noticeable abnormality. PSYCH: Pleasant and cooperative, well-groomed. Speech normal rate and rhythm. Affect is appropriate. Insight and judgement are appropriate. Attention is focused, linear, and appropriate.  NEURO: CN grossly intact. Oriented as arrived to appointment on time with no prompting. Moves both UE equally.  SKIN: No obvious lesions, wounds, erythema, or cyanosis noted on face or hands.  Assessment and Plan:   Arneta was seen today for facial numbness.  Diagnoses and all orders for this visit:  Acute focal neurologic deficit with complete resolution Comments: Concern for TIA. ASA 325 today. To Neurology urgently. Will need MRI/MRA. Will start Plavix if unable to see Neuro today or tomorrow.   Mixed hyperlipidemia Comments: LDL 98. May increase statin.  Essential hypertension Comments: Controlled.    . Reviewed expectations re: course of current medical issues. . Discussed self-management of symptoms. . Outlined signs and symptoms  indicating need for more acute intervention. . Patient verbalized understanding and all questions were answered. Marland Kitchen Health Maintenance issues including appropriate healthy diet, exercise, and smoking avoidance were discussed with patient. . See orders for this visit as documented in the electronic medical record.  Briscoe Deutscher, DO 10/27/2018   Records requested if needed. Time spent: 45 minutes, of which >50% was spent in obtaining information about her symptoms, reviewing her previous labs, evaluations, and treatments, counseling her about her condition (please see the discussed topics above), and developing a plan to further investigate it; she had a number of questions which I addressed.

## 2018-10-21 NOTE — Addendum Note (Signed)
Addended by: Jasper Loser on: 10/21/2018 10:45 AM   Modules accepted: Orders

## 2018-10-21 NOTE — Progress Notes (Deleted)
Virtual Visit via Video   I connected with Sheila Shields on 10/21/18 at  1:40 PM EDT by a video enabled telemedicine application and verified that I am speaking with the correct person using two identifiers. Location patient: Home Location provider: [facility], Office Persons participating in the virtual visit: Roxanne, [place], CMA [name], CMA acting as scribe for Dr. Briscoe Deutscher.   I discussed the limitations of evaluation and management by telemedicine and the availability of in person appointments. The patient expressed understanding and agreed to proceed.  Subjective:   HPI: Patient had numbness on left side of face for about 15 minutes then it went away. Few minutes later she had some cramping in left arm and hand. She denies any chest pain, Shortness of breath, no changes in vision or other changes.   ROS: See pertinent positives and negatives per HPI.  Patient Active Problem List   Diagnosis Date Noted  . Aortic atherosclerosis ([place]) 04/11/2018  . Allergic rhinitis 11/09/2017  . Arm mass, left 04/16/2017  . Chronic cough 10/11/2015  . COPD (chronic obstructive pulmonary disease) ([place]) 11/18/2014  . Iron deficiency anemia, unspecified 01/20/2011  . Arthropathy 02/23/2010  . HYPERGLYCEMIA 08/27/2007  . Gastroesophageal reflux disease 05/01/2007  . Hyperlipemia 12/17/2006  . Essential hypertension 12/17/2006    Social History   Tobacco Use  . Smoking status: Former Smoker    Packs/day: 2.00    Years: 30.00    Pack years: 60.00    Types: Cigarettes    Last attempt to quit: 07/10/1984    Years since quitting: 34.3  . Smokeless tobacco: Never Used  Substance Use Topics  . Alcohol use: No    Alcohol/week: 0.0 standard drinks    Comment: no alcoho since 1983    Current Outpatient Medications:  .  acetaminophen ([employer] ARTHRITIS PAIN RELIEF) 650 MG CR tablet, Take 1,300 mg by mouth 2 (two) times daily., Disp: , Rfl:  .  albuterol (PROAIR  HFA) 108 (90 Base) MCG/ACT inhaler, INHALE 2 PUFFS EVERY 4 HOURS AS NEEDED FOR COUGHING SPELLS, Disp: 8.5 Inhaler, Rfl: 2 .  aspirin 81 MG tablet, Take 81 mg by mouth daily., Disp: , Rfl:  .  benzonatate (TESSALON) 200 MG capsule, Take 1 capsule (200 mg total) by mouth 3 (three) times daily as needed for cough., Disp: 45 capsule, Rfl: 3 .  Coenzyme Q10 (COQ10 PO), Take by mouth daily., Disp: , Rfl:  .  ferrous sulfate 325 (65 FE) MG tablet, TAKE 1 TABLET BY MOUTH EVERY DAY, Disp: 90 tablet, Rfl: 3 .  fluticasone (FLONASE) 50 MCG/ACT nasal spray, Place 2 sprays into both nostrils daily., Disp: 16 g, Rfl: 2 .  hydrochlorothiazide (HYDRODIURIL) 25 MG tablet, TAKE ONE TABLET BY MOUTH DAILY, Disp: 90 tablet, Rfl: 1 .  loratadine (CLARITIN) 10 MG tablet, Take 1 tablet (10 mg total) by mouth daily. (Patient taking differently: Take 10 mg by mouth as needed. ), Disp: 90 tablet, Rfl: 3 .  meclizine (ANTIVERT) 25 MG tablet, Take 1 tablet (25 mg total) by mouth 3 (three) times daily as needed for dizziness., Disp: 30 tablet, Rfl: 0 .  Multiple Vitamin (MULTIVITAMIN WITH MINERALS) TABS tablet, Take 1 tablet by mouth daily., Disp: , Rfl:  .  pantoprazole (PROTONIX) 40 MG tablet, Take 1 tablet (40 mg total) by mouth daily., Disp: 90 tablet, Rfl: 3 .  potassium chloride SA (K-DUR,KLOR-CON) 20 MEQ tablet, TAKE 1 TABLET BY MOUTH DAILY, Disp: 90 tablet,  Rfl: 0 .  ranitidine (ZANTAC) 300 MG tablet, Take 1 tablet (300 mg total) by mouth at bedtime., Disp: 90 tablet, Rfl: 3 .  simvastatin (ZOCOR) 20 MG tablet, TAKE ONE TABLET BY MOUTH EVERY MORNING, Disp: 90 tablet, Rfl: 2 .  Vitamin D, Cholecalciferol, 1000 units CAPS, Take 1 capsule by mouth daily., Disp: , Rfl:   Allergies  Allergen Reactions  . Alcohol   . Codeine     Patient prefers not to take this due to history of alcoholism  . Dilaudid [Hydromorphone Hcl]     Per patient she was told she had a reaction after surgery-reaction unknown-jaf    Objective:    VITALS: Per patient if applicable, see vitals. GENERAL: Alert, appears well and in no acute distress. HEENT: Atraumatic, conjunctiva clear, no obvious abnormalities on inspection of external nose and ears. NECK: Normal movements of the head and neck. CARDIOPULMONARY: No increased WOB. Speaking in clear sentences. I:E ratio WNL.  MS: Moves all visible extremities without noticeable abnormality. PSYCH: Pleasant and cooperative, well-groomed. Speech normal rate and rhythm. Affect is appropriate. Insight and judgement are appropriate. Attention is focused, linear, and appropriate.  NEURO: CN grossly intact. Oriented as arrived to appointment on time with no prompting. Moves both UE equally.  SKIN: No obvious lesions, wounds, erythema, or cyanosis noted on face or hands.  Assessment and Plan:   There are no diagnoses linked to this encounter.  . Reviewed expectations re: course of current medical issues. . Discussed self-management of symptoms. . Outlined signs and symptoms indicating need for more acute intervention. . Patient verbalized understanding and all questions were answered. Marland Kitchen Health Maintenance issues including appropriate healthy diet, exercise, and smoking avoidance were discussed with patient. . See orders for this visit as documented in the electronic medical record.  Francella Solian, [place] 10/21/2018

## 2018-10-22 ENCOUNTER — Encounter: Payer: Self-pay | Admitting: *Deleted

## 2018-10-22 ENCOUNTER — Telehealth (INDEPENDENT_AMBULATORY_CARE_PROVIDER_SITE_OTHER): Payer: Medicare HMO | Admitting: Neurology

## 2018-10-22 ENCOUNTER — Encounter: Payer: Self-pay | Admitting: Neurology

## 2018-10-22 DIAGNOSIS — G459 Transient cerebral ischemic attack, unspecified: Secondary | ICD-10-CM

## 2018-10-22 DIAGNOSIS — I10 Essential (primary) hypertension: Secondary | ICD-10-CM

## 2018-10-22 DIAGNOSIS — E785 Hyperlipidemia, unspecified: Secondary | ICD-10-CM

## 2018-10-22 MED ORDER — CLOPIDOGREL BISULFATE 75 MG PO TABS: 75.0000 mg | ORAL_TABLET | Freq: Every day | ORAL | 3 refills | Status: DC

## 2018-10-22 MED ORDER — ATORVASTATIN CALCIUM 10 MG PO TABS: 10.0000 mg | ORAL_TABLET | Freq: Every day | ORAL | 3 refills | Status: DC

## 2018-10-22 NOTE — Progress Notes (Signed)
Virtual Visit via Video Note The purpose of this virtual visit is to provide medical care while limiting exposure to the novel coronavirus.    Consent was obtained for video visit:  Yes.   Answered questions that patient had about telehealth interaction:  Yes.   I discussed the limitations, risks, security and privacy concerns of performing an evaluation and management service by telemedicine. I also discussed with the patient that there may be a patient responsible charge related to this service. The patient expressed understanding and agreed to proceed.  Pt location: Home Physician Location: office Name of referring provider:  Briscoe Deutscher, DO I connected with Sheila Shields at patients initiation/request on 10/22/2018 at  2:30 PM EDT by video enabled telemedicine application and verified that I am speaking with the correct person using two identifiers. Pt MRN:  [id number] Pt DOB:  19-Jun-1935 Video Participants:  Sheila Shields   History of Present Illness:  Sheila Shields is an 84 year old Caucasian woman with COPD, hypertension, hyperlipidemia, arthritis, alcoholism in remission and ocular migraines who presents for headache with left sided numbness.  History supplemented by referring provider note.  She has longstanding history of ocular migraines presenting as wavy lines in her vision.  She had increased frequency of ocular migraines for a few days about a month ago.  She was evaluated by ophthalmology with unremarkable exam.  About a week ago, she developed a dull nonthrobbing headache across the forehead and down the left side of her face, ear and neck.  On Sunday night, she was relaxing in her recliner when she developed left sided facial numbness lasting less than 5 minutes.  45 minutes later, her left hand became numb and contracted into a fist.  She couldn't open her fist.  This lasted less than 5 minutes.  There was no associated visual disturbance, facial droop, slurred speech  or unilateral pain or weakness of the arm and hand.  CT of head without contrast performed yesterday was personally reviewed and demonstrated mild chronic small vessel ischemic changes and atrophy but no acute intracranial abnormality.  Last night, her left arm and hand became numb for a couple of minutes.  No recurrent episodes today but continues to have mild headache and left sided neck pain.  She has been on ASA 81mg  daily for several years.  She takes simvastatin 20mg  daily.  Lipid panel from 08/08/18 demonstrated cholesterol 173, TG 156, HDL 43.60 and LDL 98.  Other than ocular migraines, she denies prior history of classic migraines or migraines with headache.    Past Medical History:     Past Medical History:  Diagnosis Date  . Alcoholism in remission ([place]) 08/27/2007  . Anemia   . Arthritis    OA  . COPD 12/17/2006  . Diverticulosis 03/27/2007  . GERD 05/01/2007  . HYPERGLYCEMIA 08/27/2007  . HYPERLIPIDEMIA 12/17/2006  . HYPERTENSION 12/17/2006  . Mood disorder ([facility])    hx anxiety and depression  . Rectal polyp 03/27/2007   adenoma    Medications:     Outpatient Encounter Medications as of 10/22/2018  Medication Sig  . acetaminophen ([employer] ARTHRITIS PAIN RELIEF) 650 MG CR tablet Take 1,300 mg by mouth 2 (two) times daily.  Marland Kitchen albuterol (PROAIR HFA) 108 (90 Base) MCG/ACT inhaler INHALE 2 PUFFS EVERY 4 HOURS AS NEEDED FOR COUGHING SPELLS  . aspirin 81 MG tablet Take 81 mg by mouth daily.  . benzonatate (TESSALON) 200 MG capsule Take 1 capsule (200 mg total)  by mouth 3 (three) times daily as needed for cough.  . Coenzyme Q10 (COQ10 PO) Take by mouth daily.  . ferrous sulfate 325 (65 FE) MG tablet TAKE 1 TABLET BY MOUTH EVERY DAY  . fluticasone (FLONASE) 50 MCG/ACT nasal spray Place 2 sprays into both nostrils daily.  . hydrochlorothiazide (HYDRODIURIL) 25 MG tablet TAKE ONE TABLET BY MOUTH DAILY  . loratadine (CLARITIN) 10 MG tablet Take 1 tablet (10 mg total) by mouth daily.  (Patient taking differently: Take 10 mg by mouth as needed. )  . meclizine (ANTIVERT) 25 MG tablet Take 1 tablet (25 mg total) by mouth 3 (three) times daily as needed for dizziness.  . Multiple Vitamin (MULTIVITAMIN WITH MINERALS) TABS tablet Take 1 tablet by mouth daily.  . pantoprazole (PROTONIX) 40 MG tablet Take 1 tablet (40 mg total) by mouth daily.  . potassium chloride SA (K-DUR,KLOR-CON) 20 MEQ tablet TAKE 1 TABLET BY MOUTH DAILY  . ranitidine (ZANTAC) 300 MG tablet Take 1 tablet (300 mg total) by mouth at bedtime.  . simvastatin (ZOCOR) 20 MG tablet TAKE ONE TABLET BY MOUTH EVERY MORNING  . Vitamin D, Cholecalciferol, 1000 units CAPS Take 1 capsule by mouth daily.   No facility-administered encounter medications on file as of 10/22/2018.     Allergies:      Allergies  Allergen Reactions  . Alcohol   . Codeine     Patient prefers not to take this due to history of alcoholism  . Dilaudid [Hydromorphone Hcl]     Per patient she was told she had a reaction after surgery-reaction unknown-jaf    Family History:      Family History  Problem Relation Age of Onset  . Throat cancer Mother   . Heart attack Father 11  . Idiopathic pulmonary fibrosis Brother 39  . Cancer Brother        sarcoma  . Heart disease Brother   . Colon cancer Neg Hx   . Esophageal cancer Neg Hx   . Stomach cancer Neg Hx     Social History: Social History        Socioeconomic History  . Marital status: Married    Spouse name: Not on file  . Number of children: 2  . Years of education: Not on file  . Highest education level: Not on file  Occupational History  . Occupation: retired  Scientific laboratory technician  . Financial resource strain: Not on file  . Food insecurity:    Worry: Not on file    Inability: Not on file  . Transportation needs:    Medical: Not on file    Non-medical: Not on file  Tobacco Use  . Smoking status: Former Smoker    Packs/day: 2.00    Years:  30.00    Pack years: 60.00    Types: Cigarettes    Last attempt to quit: 07/10/1984    Years since quitting: 34.3  . Smokeless tobacco: Never Used  Substance and Sexual Activity  . Alcohol use: No    Alcohol/week: 0.0 standard drinks    Comment: no alcoho since 1983  . Drug use: No  . Sexual activity: Not on file  Lifestyle  . Physical activity:    Days per week: Not on file    Minutes per session: Not on file  . Stress: Not on file  Relationships  . Social connections:    Talks on phone: Not on file    Gets together: Not on file  Attends religious service: Not on file    Active member of club or organization: Not on file    Attends meetings of clubs or organizations: Not on file    Relationship status: Not on file  . Intimate partner violence:    Fear of current or ex partner: Not on file    Emotionally abused: Not on file    Physically abused: Not on file    Forced sexual activity: Not on file  Other Topics Concern  . Not on file  Social History Narrative   Work or School: very active in [employer] Situation: lives with husband      Spiritual Beliefs: Christian      Lifestyle:tries to walk; diet ok - denies any alcohol or tobacco use in many many years      Observations/Objective:   There were no vitals filed for this visit.  alert and oriented to person, place, and time. Attention span and concentration intact, recent and remote memory intact, fund of knowledge intact.  Speech fluent and not dysarthric, language intact.  Eyes orthophoric on primary gaze and move in all directions.  Facial sensation intact.  Face symmetric.  Tongue midline.  No pronator drift.  Romberg negative.  Assessment and Plan:   1.  Concern for possible stroke or TIA.  Complicated migraine possible.  However given her stroke risk factors (age, hyperlipidemia, hypertension) and no prior history of migraines with similar symptoms, I  would more strongly consider CVA vs TIA. 2.  Hyperlipidemia 3.  Hypertension  1.  She will start Plavix 75mg .  She will take ASA 81mg  and Plavix 75mg  daily for 3 weeks, then discontinue ASA and remain on Plavix alone for secondary stroke prevention. 2.  We will change statin from simvastatin to atorvastatin 10mg  daily.  Repeat lipid panel in 3 months. 3.  MRI of brain and MRA of head and neck 4.  Will likely need to check 2D echo with bubble study 5.  Blood pressure control 6.  Follow up in 4 months.  Follow Up Instructions:              -I discussed the assessment and treatment plan with the patient. The patient was provided an opportunity to ask questions and all were answered. The patient agreed with the plan and demonstrated an understanding of the instructions.  The patient was advised to call back or seek an in-person evaluation if the symptoms worsen or if the condition fails to improve as anticipated.   Dudley Major, DO

## 2018-10-22 NOTE — Patient Instructions (Addendum)
1.  Start clopidogrel (Plavix) 75mg  daily.  Take with aspirin 81mg  daily for 3 weeks.  After 3 weeks, stop aspirin and continue clopidogrel (Plavix) 75mg  daily alone. 2.  Stop simvastatin.  Instead, start atorvastatin 10mg  daily.  I would like to recheck a lipid panel in 3 months. 3.  We will check MRI of brain and MRA of head and neck 4.  Make follow up appointment in 4 months.

## 2018-10-22 NOTE — Consult Note (Signed)
Virtual Visit via Video Note The purpose of this virtual visit is to provide medical care while limiting exposure to the novel coronavirus.    Consent was obtained for video visit:  Yes.   Answered questions that patient had about telehealth interaction:  Yes.   I discussed the limitations, risks, security and privacy concerns of performing an evaluation and management service by telemedicine. I also discussed with the patient that there may be a patient responsible charge related to this service. The patient expressed understanding and agreed to proceed.  Pt location: Home Physician Location: office Name of referring provider:  Briscoe Deutscher, DO I connected with Sheila Shields at patients initiation/request on 10/22/2018 at  2:30 PM EDT by video enabled telemedicine application and verified that I am speaking with the correct person using two identifiers. Pt MRN:  [id number] Pt DOB:  1934-11-01 Video Participants:  Sheila Shields   History of Present Illness:  Sheila Shields is an 84 year old Caucasian woman with COPD, hypertension, hyperlipidemia, arthritis, alcoholism in remission and ocular migraines who presents for headache with left sided numbness.  History supplemented by referring provider note.  She has longstanding history of ocular migraines presenting as wavy lines in her vision.  She had increased frequency of ocular migraines for a few days about a month ago.  She was evaluated by ophthalmology with unremarkable exam.  About a week ago, she developed a dull nonthrobbing headache across the forehead and down the left side of her face, ear and neck.  On Sunday night, she was relaxing in her recliner when she developed left sided facial numbness lasting less than 5 minutes.  45 minutes later, her left hand became numb and contracted into a fist.  She couldn't open her fist.  This lasted less than 5 minutes.  There was no associated visual disturbance, facial droop, slurred speech or  unilateral pain or weakness of the arm and hand.  CT of head without contrast performed yesterday was personally reviewed and demonstrated mild chronic small vessel ischemic changes and atrophy but no acute intracranial abnormality.  Last night, her left arm and hand became numb for a couple of minutes.  No recurrent episodes today but continues to have mild headache and left sided neck pain.  She has been on ASA 81mg  daily for several years.  She takes simvastatin 20mg  daily.  Lipid panel from 08/08/18 demonstrated cholesterol 173, TG 156, HDL 43.60 and LDL 98.  Other than ocular migraines, she denies prior history of classic migraines or migraines with headache.    Past Medical History: Past Medical History:  Diagnosis Date  . Alcoholism in remission ([place]) 08/27/2007  . Anemia   . Arthritis    OA  . COPD 12/17/2006  . Diverticulosis 03/27/2007  . GERD 05/01/2007  . HYPERGLYCEMIA 08/27/2007  . HYPERLIPIDEMIA 12/17/2006  . HYPERTENSION 12/17/2006  . Mood disorder ([facility])    hx anxiety and depression  . Rectal polyp 03/27/2007   adenoma    Medications: Outpatient Encounter Medications as of 10/22/2018  Medication Sig  . acetaminophen ([employer] ARTHRITIS PAIN RELIEF) 650 MG CR tablet Take 1,300 mg by mouth 2 (two) times daily.  Marland Kitchen albuterol (PROAIR HFA) 108 (90 Base) MCG/ACT inhaler INHALE 2 PUFFS EVERY 4 HOURS AS NEEDED FOR COUGHING SPELLS  . aspirin 81 MG tablet Take 81 mg by mouth daily.  . benzonatate (TESSALON) 200 MG capsule Take 1 capsule (200 mg total) by mouth 3 (three) times daily as needed  for cough.  . Coenzyme Q10 (COQ10 PO) Take by mouth daily.  . ferrous sulfate 325 (65 FE) MG tablet TAKE 1 TABLET BY MOUTH EVERY DAY  . fluticasone (FLONASE) 50 MCG/ACT nasal spray Place 2 sprays into both nostrils daily.  . hydrochlorothiazide (HYDRODIURIL) 25 MG tablet TAKE ONE TABLET BY MOUTH DAILY  . loratadine (CLARITIN) 10 MG tablet Take 1 tablet (10 mg total) by mouth daily. (Patient taking  differently: Take 10 mg by mouth as needed. )  . meclizine (ANTIVERT) 25 MG tablet Take 1 tablet (25 mg total) by mouth 3 (three) times daily as needed for dizziness.  . Multiple Vitamin (MULTIVITAMIN WITH MINERALS) TABS tablet Take 1 tablet by mouth daily.  . pantoprazole (PROTONIX) 40 MG tablet Take 1 tablet (40 mg total) by mouth daily.  . potassium chloride SA (K-DUR,KLOR-CON) 20 MEQ tablet TAKE 1 TABLET BY MOUTH DAILY  . ranitidine (ZANTAC) 300 MG tablet Take 1 tablet (300 mg total) by mouth at bedtime.  . simvastatin (ZOCOR) 20 MG tablet TAKE ONE TABLET BY MOUTH EVERY MORNING  . Vitamin D, Cholecalciferol, 1000 units CAPS Take 1 capsule by mouth daily.   No facility-administered encounter medications on file as of 10/22/2018.     Allergies: Allergies  Allergen Reactions  . Alcohol   . Codeine     Patient prefers not to take this due to history of alcoholism  . Dilaudid [Hydromorphone Hcl]     Per patient she was told she had a reaction after surgery-reaction unknown-jaf    Family History: Family History  Problem Relation Age of Onset  . Throat cancer Mother   . Heart attack Father 85  . Idiopathic pulmonary fibrosis Brother 20  . Cancer Brother        sarcoma  . Heart disease Brother   . Colon cancer Neg Hx   . Esophageal cancer Neg Hx   . Stomach cancer Neg Hx     Social History: Social History   Socioeconomic History  . Marital status: Married    Spouse name: Not on file  . Number of children: 2  . Years of education: Not on file  . Highest education level: Not on file  Occupational History  . Occupation: retired  Scientific laboratory technician  . Financial resource strain: Not on file  . Food insecurity:    Worry: Not on file    Inability: Not on file  . Transportation needs:    Medical: Not on file    Non-medical: Not on file  Tobacco Use  . Smoking status: Former Smoker    Packs/day: 2.00    Years: 30.00    Pack years: 60.00    Types: Cigarettes    Last attempt to  quit: 07/10/1984    Years since quitting: 34.3  . Smokeless tobacco: Never Used  Substance and Sexual Activity  . Alcohol use: No    Alcohol/week: 0.0 standard drinks    Comment: no alcoho since 1983  . Drug use: No  . Sexual activity: Not on file  Lifestyle  . Physical activity:    Days per week: Not on file    Minutes per session: Not on file  . Stress: Not on file  Relationships  . Social connections:    Talks on phone: Not on file    Gets together: Not on file    Attends religious service: Not on file    Active member of club or organization: Not on file    Attends meetings  of clubs or organizations: Not on file    Relationship status: Not on file  . Intimate partner violence:    Fear of current or ex partner: Not on file    Emotionally abused: Not on file    Physically abused: Not on file    Forced sexual activity: Not on file  Other Topics Concern  . Not on file  Social History Narrative   Work or School: very active in [place] Situation: lives with husband      Spiritual Beliefs: Christian      Lifestyle:tries to walk; diet ok - denies any alcohol or tobacco use in many many years      Observations/Objective:   There were no vitals filed for this visit.  alert and oriented to person, place, and time. Attention span and concentration intact, recent and remote memory intact, fund of knowledge intact.  Speech fluent and not dysarthric, language intact.  Eyes orthophoric on primary gaze and move in all directions.  Facial sensation intact.  Face symmetric.  Tongue midline.  No pronator drift.  Romberg negative.  Assessment and Plan:   1.  Concern for possible stroke or TIA.  Complicated migraine possible.  However given her stroke risk factors (age, hyperlipidemia, hypertension) and no prior history of migraines with similar symptoms, I would more strongly consider CVA vs TIA. 2.  Hyperlipidemia 3.  Hypertension  1.  She will start Plavix 75mg .  She  will take ASA 81mg  and Plavix 75mg  daily for 3 weeks, then discontinue ASA and remain on Plavix alone for secondary stroke prevention. 2.  We will change statin from simvastatin to atorvastatin 10mg  daily.  Repeat lipid panel in 3 months. 3.  MRI of brain and MRA of head and neck 4.  Will likely need to check 2D echo with bubble study 5.  Blood pressure control 6.  Follow up in 4 months.  Follow Up Instructions:    -I discussed the assessment and treatment plan with the patient. The patient was provided an opportunity to ask questions and all were answered. The patient agreed with the plan and demonstrated an understanding of the instructions.   The patient was advised to call back or seek an in-person evaluation if the symptoms worsen or if the condition fails to improve as anticipated.   Dudley Major, DO

## 2018-10-22 NOTE — Addendum Note (Signed)
Addended by: Chester Holstein on: 10/22/2018 03:10 PM   Modules accepted: Orders

## 2018-10-27 ENCOUNTER — Encounter: Payer: Self-pay | Admitting: Family Medicine

## 2018-10-28 ENCOUNTER — Ambulatory Visit: Payer: Self-pay

## 2018-10-29 ENCOUNTER — Other Ambulatory Visit: Payer: Self-pay | Admitting: *Deleted

## 2018-10-29 DIAGNOSIS — G459 Transient cerebral ischemic attack, unspecified: Secondary | ICD-10-CM

## 2018-10-30 NOTE — Progress Notes (Addendum)
Virtual Visit via Video   I connected with Sheila Shields by a video enabled telemedicine application and verified that I am speaking with the correct person using two identifiers. Location patient: Home Location provider: [facility], Office Persons participating in the virtual visit: Sheila Shields, Briscoe Deutscher, DO, [name], as CMA scribe.   I discussed the limitations of evaluation and management by telemedicine and the availability of in person appointments. The patient expressed understanding and agreed to proceed.  Subjective:   HPI: 84 year old with HTN, HLD, ocular migraines, alcoholism in remission. Complained of increased frequency of ocular migraines at last visit. Last week, she developed a headache, left side. On Sunday, she had a five minute episode of left facial numbness. About an hour later, her left hand became numb and weak, for another five minutes.  No vision changes, dizziness, trouble speaking or swallowing. She called with symptoms that morning. CT head without contrast ordered STAT. Showed mild chronic small vessel ischemic changes and atrophy but no acute process.   Lab Results  Component Value Date   CHOL 173 08/08/2018   HDL 43.60 08/08/2018   LDLCALC 98 08/08/2018   LDLDIRECT 86.0 08/21/2017   TRIG 156.0 (H) 08/08/2018   CHOLHDL 4 08/08/2018   BP Readings from Last 3 Encounters:  09/24/18 132/84  08/09/18 112/76  08/08/18 110/72   ROS: See pertinent positives and negatives per HPI.  Patient Active Problem List   Diagnosis Date Noted  . Aortic atherosclerosis ([place]) 04/11/2018  . Allergic rhinitis 11/09/2017  . Arm mass, left 04/16/2017  . Chronic cough 10/11/2015  . COPD (chronic obstructive pulmonary disease) ([place]) 11/18/2014  . Iron deficiency anemia, unspecified 01/20/2011  . Arthropathy 02/23/2010  . HYPERGLYCEMIA 08/27/2007  . Gastroesophageal reflux disease 05/01/2007  . Hyperlipemia 12/17/2006  . Essential hypertension  12/17/2006    Social History   Tobacco Use  . Smoking status: Former Smoker    Packs/day: 2.00    Years: 30.00    Pack years: 60.00    Types: Cigarettes    Last attempt to quit: 07/10/1984    Years since quitting: 34.3  . Smokeless tobacco: Never Used  Substance Use Topics  . Alcohol use: No    Alcohol/week: 0.0 standard drinks    Comment: no alcoho since 1983    Current Outpatient Medications:  .  acetaminophen ([employer] ARTHRITIS PAIN RELIEF) 650 MG CR tablet, Take 1,300 mg by mouth 2 (two) times daily., Disp: , Rfl:  .  albuterol (PROAIR HFA) 108 (90 Base) MCG/ACT inhaler, INHALE 2 PUFFS EVERY 4 HOURS AS NEEDED FOR COUGHING SPELLS, Disp: 8.5 Inhaler, Rfl: 2 .  aspirin 81 MG tablet, Take 81 mg by mouth daily., Disp: , Rfl:  .  benzonatate (TESSALON) 200 MG capsule, Take 1 capsule (200 mg total) by mouth 3 (three) times daily as needed for cough., Disp: 45 capsule, Rfl: 3 .  Coenzyme Q10 (COQ10 PO), Take by mouth daily., Disp: , Rfl:  .  ferrous sulfate 325 (65 FE) MG tablet, TAKE 1 TABLET BY MOUTH EVERY DAY, Disp: 90 tablet, Rfl: 3 .  fluticasone (FLONASE) 50 MCG/ACT nasal spray, Place 2 sprays into both nostrils daily., Disp: 16 g, Rfl: 2 .  hydrochlorothiazide (HYDRODIURIL) 25 MG tablet, TAKE ONE TABLET BY MOUTH DAILY, Disp: 90 tablet, Rfl: 1 .  loratadine (CLARITIN) 10 MG tablet, Take 1 tablet (10 mg total) by mouth daily. (Patient taking differently: Take 10 mg by mouth as needed. ), Disp:  90 tablet, Rfl: 3 .  meclizine (ANTIVERT) 25 MG tablet, Take 1 tablet (25 mg total) by mouth 3 (three) times daily as needed for dizziness., Disp: 30 tablet, Rfl: 0 .  Multiple Vitamin (MULTIVITAMIN WITH MINERALS) TABS tablet, Take 1 tablet by mouth daily., Disp: , Rfl:  .  pantoprazole (PROTONIX) 40 MG tablet, Take 1 tablet (40 mg total) by mouth daily., Disp: 90 tablet, Rfl: 3 .  potassium chloride SA (K-DUR,KLOR-CON) 20 MEQ tablet, TAKE 1 TABLET BY MOUTH DAILY, Disp: 90 tablet, Rfl: 0 .   ranitidine (ZANTAC) 300 MG tablet, Take 1 tablet (300 mg total) by mouth at bedtime., Disp: 90 tablet, Rfl: 3 .  simvastatin (ZOCOR) 20 MG tablet, TAKE ONE TABLET BY MOUTH EVERY MORNING, Disp: 90 tablet, Rfl: 2 .  Vitamin D, Cholecalciferol, 1000 units CAPS, Take 1 capsule by mouth daily., Disp: , Rfl:   Allergies  Allergen Reactions  . Alcohol   . Codeine     Patient prefers not to take this due to history of alcoholism  . Dilaudid [Hydromorphone Hcl]     Per patient she was told she had a reaction after surgery-reaction unknown-jaf    Objective:   VITALS: Per patient if applicable, see vitals. GENERAL: Alert, appears well and in no acute distress. HEENT: Atraumatic, conjunctiva clear, no obvious abnormalities on inspection of external nose and ears. NECK: Normal movements of the head and neck. CARDIOPULMONARY: No increased WOB. Speaking in clear sentences. I:E ratio WNL.  MS: Moves all visible extremities without noticeable abnormality. PSYCH: Pleasant and cooperative, well-groomed. Speech normal rate and rhythm. Affect is appropriate. Insight and judgement are appropriate. Attention is focused, linear, and appropriate.  NEURO: CN grossly intact. Oriented as arrived to appointment on time with no prompting. Moves both UE equally.  SKIN: No obvious lesions, wounds, erythema, or cyanosis noted on face or hands.  Assessment and Plan:   Sheila Shields was seen today for facial numbness.  Diagnoses and all orders for this visit:  Acute focal neurologic deficit with complete resolution Comments: Concern for TIA. ASA 325 today. To Neurology urgently. Will need MRI/MRA. Will start Plavix if unable to see Neuro today or tomorrow.   Mixed hyperlipidemia Comments: LDL 98. May increase statin.  Essential hypertension Comments: Controlled.    . Reviewed expectations re: course of current medical issues. . Discussed self-management of symptoms. . Outlined signs and symptoms indicating need  for more acute intervention. . Patient verbalized understanding and all questions were answered. Marland Kitchen Health Maintenance issues including appropriate healthy diet, exercise, and smoking avoidance were discussed with patient. . See orders for this visit as documented in the electronic medical record.  Briscoe Deutscher, DO  Records requested if needed. Time spent: 45 minutes, of which >50% was spent in obtaining information about her symptoms, reviewing her previous labs, evaluations, and treatments, counseling her about her condition (please see the discussed topics above), and developing a plan to further investigate it; she had a number of questions which I addressed.

## 2018-11-05 ENCOUNTER — Ambulatory Visit
Admission: RE | Admit: 2018-11-05 | Discharge: 2018-11-05 | Disposition: A | Discharge status: Home / Self Care | Payer: Medicare HMO | Source: Ambulatory Visit | Attending: Neurology | Admitting: Neurology

## 2018-11-05 ENCOUNTER — Other Ambulatory Visit: Payer: Self-pay

## 2018-11-05 DIAGNOSIS — I10 Essential (primary) hypertension: Secondary | ICD-10-CM

## 2018-11-05 DIAGNOSIS — G459 Transient cerebral ischemic attack, unspecified: Secondary | ICD-10-CM

## 2018-11-05 DIAGNOSIS — E785 Hyperlipidemia, unspecified: Secondary | ICD-10-CM

## 2018-11-05 MED ORDER — GADOBENATE DIMEGLUMINE 529 MG/ML IV SOLN
17.0000 mL | Freq: Once | INTRAVENOUS | Status: AC | PRN
  Administered 2018-11-05: 15:00:00 17 mL via INTRAVENOUS

## 2018-11-06 ENCOUNTER — Telehealth: Payer: Self-pay | Admitting: *Deleted

## 2018-11-06 ENCOUNTER — Telehealth: Payer: Self-pay | Admitting: Neurology

## 2018-11-06 ENCOUNTER — Other Ambulatory Visit: Payer: Self-pay | Admitting: *Deleted

## 2018-11-06 DIAGNOSIS — G459 Transient cerebral ischemic attack, unspecified: Secondary | ICD-10-CM

## 2018-11-06 DIAGNOSIS — I10 Essential (primary) hypertension: Secondary | ICD-10-CM

## 2018-11-06 DIAGNOSIS — E785 Hyperlipidemia, unspecified: Secondary | ICD-10-CM

## 2018-11-06 NOTE — Telephone Encounter (Signed)
-----   Message from Pieter Partridge, DO sent at 11/06/2018 12:07 PM EDT ----- I already discussed results with Mrs. Sheila Shields. I would like to refer her to Dr. Luanne Bras regarding cerebral aneurysm.

## 2018-11-06 NOTE — Telephone Encounter (Signed)
Referral sent to Dr. Estanislado Pandy.

## 2018-11-06 NOTE — Telephone Encounter (Signed)
I called Mrs. Standiford to discuss MRI results.  MRI showed no stroke or large vessel stenosis to correspond with her symptoms.  It incidentally revealed right vertebral artery stenosis but basilar is supplied by the left vertebral artery.  It also showed incidental 8 mm supraclinoid ICA aneurysm.    I would like to refer her to Dr. Estanislado Pandy regarding the aneurysm. Once she finishes the 3 weeks of dual antiplatelet therapy, she will discontinue ASA and just remain on Plavix.

## 2018-11-12 ENCOUNTER — Encounter: Payer: Self-pay | Admitting: Family Medicine

## 2018-11-13 ENCOUNTER — Other Ambulatory Visit: Payer: Self-pay | Admitting: *Deleted

## 2018-11-13 DIAGNOSIS — I10 Essential (primary) hypertension: Secondary | ICD-10-CM

## 2018-11-13 DIAGNOSIS — D509 Iron deficiency anemia, unspecified: Secondary | ICD-10-CM

## 2018-11-13 DIAGNOSIS — R7309 Other abnormal glucose: Secondary | ICD-10-CM

## 2018-11-14 ENCOUNTER — Other Ambulatory Visit (INDEPENDENT_AMBULATORY_CARE_PROVIDER_SITE_OTHER): Payer: Medicare HMO

## 2018-11-14 DIAGNOSIS — I10 Essential (primary) hypertension: Secondary | ICD-10-CM | POA: Diagnosis not present

## 2018-11-14 DIAGNOSIS — D509 Iron deficiency anemia, unspecified: Secondary | ICD-10-CM | POA: Diagnosis not present

## 2018-11-14 DIAGNOSIS — R7309 Other abnormal glucose: Secondary | ICD-10-CM | POA: Diagnosis not present

## 2018-11-14 LAB — COMPREHENSIVE METABOLIC PANEL
ALT: 14 U/L (ref 0–35)
AST: 19 U/L (ref 0–37)
Albumin: 4.1 g/dL (ref 3.5–5.2)
Alkaline Phosphatase: 71 U/L (ref 39–117)
BUN: 15 mg/dL (ref 6–23)
CO2: 31 mEq/L (ref 19–32)
Calcium: 9.6 mg/dL (ref 8.4–10.5)
Chloride: 101 mEq/L (ref 96–112)
Creatinine, Ser: 0.74 mg/dL (ref 0.40–1.20)
GFR: 75.17 mL/min (ref >60.00)
Glucose, Bld: 104 mg/dL — ABNORMAL HIGH (ref 70–99)
Potassium: 3.7 mEq/L (ref 3.5–5.1)
Sodium: 142 mEq/L (ref 135–145)
Total Bilirubin: 0.8 mg/dL (ref 0.2–1.2)
Total Protein: 6.3 g/dL (ref 6.0–8.3)

## 2018-11-14 LAB — CBC WITH DIFFERENTIAL/PLATELET
Basophils Absolute: 0.1 10*3/uL (ref 0.0–0.1)
Basophils Relative: 1.1 % (ref 0.0–3.0)
Eosinophils Absolute: 0.6 10*3/uL (ref 0.0–0.7)
Eosinophils Relative: 8.2 % — ABNORMAL HIGH (ref 0.0–5.0)
HCT: 42.6 % (ref 36.0–46.0)
Hemoglobin: 14.6 g/dL (ref 12.0–15.0)
Lymphocytes Relative: 21.6 % (ref 12.0–46.0)
Lymphs Abs: 1.6 10*3/uL (ref 0.7–4.0)
MCHC: 34.4 g/dL (ref 30.0–36.0)
MCV: 91.9 fl (ref 78.0–100.0)
Monocytes Absolute: 0.7 10*3/uL (ref 0.1–1.0)
Monocytes Relative: 9.7 % (ref 3.0–12.0)
Neutro Abs: 4.4 10*3/uL (ref 1.4–7.7)
Neutrophils Relative %: 59.4 % (ref 43.0–77.0)
Platelets: 272 10*3/uL (ref 150.0–400.0)
RBC: 4.64 Mil/uL (ref 3.87–5.11)
RDW: 13.3 % (ref 11.5–15.5)
WBC: 7.4 10*3/uL (ref 4.0–10.5)

## 2018-11-14 LAB — HEMOGLOBIN A1C: Hgb A1c MFr Bld: 6 % (ref 4.6–6.5)

## 2018-11-20 ENCOUNTER — Other Ambulatory Visit: Payer: Self-pay | Admitting: Adult Health

## 2018-11-22 ENCOUNTER — Other Ambulatory Visit (HOSPITAL_COMMUNITY): Payer: Self-pay | Admitting: Interventional Radiology

## 2018-11-22 DIAGNOSIS — I671 Cerebral aneurysm, nonruptured: Secondary | ICD-10-CM

## 2018-11-27 ENCOUNTER — Other Ambulatory Visit: Payer: Self-pay | Admitting: Student

## 2018-11-27 ENCOUNTER — Other Ambulatory Visit: Payer: Self-pay | Admitting: Family Medicine

## 2018-11-28 ENCOUNTER — Other Ambulatory Visit: Payer: Self-pay

## 2018-11-28 ENCOUNTER — Other Ambulatory Visit (HOSPITAL_COMMUNITY): Payer: Self-pay | Admitting: Interventional Radiology

## 2018-11-28 ENCOUNTER — Encounter (HOSPITAL_COMMUNITY): Payer: Self-pay

## 2018-11-28 ENCOUNTER — Other Ambulatory Visit: Payer: Self-pay | Admitting: Family Medicine

## 2018-11-28 ENCOUNTER — Ambulatory Visit (HOSPITAL_COMMUNITY)
Admission: RE | Admit: 2018-11-28 | Discharge: 2018-11-28 | Disposition: A | Discharge status: Home / Self Care | Payer: Medicare HMO | Source: Ambulatory Visit | Attending: Interventional Radiology | Admitting: Interventional Radiology

## 2018-11-28 DIAGNOSIS — J449 Chronic obstructive pulmonary disease, unspecified: Secondary | ICD-10-CM | POA: Insufficient documentation

## 2018-11-28 DIAGNOSIS — I671 Cerebral aneurysm, nonruptured: Secondary | ICD-10-CM

## 2018-11-28 DIAGNOSIS — Z79899 Other long term (current) drug therapy: Secondary | ICD-10-CM | POA: Insufficient documentation

## 2018-11-28 DIAGNOSIS — K219 Gastro-esophageal reflux disease without esophagitis: Secondary | ICD-10-CM | POA: Diagnosis not present

## 2018-11-28 DIAGNOSIS — I720 Aneurysm of carotid artery: Secondary | ICD-10-CM | POA: Diagnosis not present

## 2018-11-28 DIAGNOSIS — I10 Essential (primary) hypertension: Secondary | ICD-10-CM | POA: Diagnosis not present

## 2018-11-28 DIAGNOSIS — D649 Anemia, unspecified: Secondary | ICD-10-CM | POA: Diagnosis not present

## 2018-11-28 DIAGNOSIS — R69 Illness, unspecified: Secondary | ICD-10-CM | POA: Diagnosis not present

## 2018-11-28 DIAGNOSIS — E785 Hyperlipidemia, unspecified: Secondary | ICD-10-CM | POA: Insufficient documentation

## 2018-11-28 DIAGNOSIS — F329 Major depressive disorder, single episode, unspecified: Secondary | ICD-10-CM | POA: Diagnosis not present

## 2018-11-28 DIAGNOSIS — F419 Anxiety disorder, unspecified: Secondary | ICD-10-CM | POA: Diagnosis not present

## 2018-11-28 DIAGNOSIS — Z87891 Personal history of nicotine dependence: Secondary | ICD-10-CM | POA: Diagnosis not present

## 2018-11-28 HISTORY — PX: IR ANGIO VERTEBRAL SEL SUBCLAVIAN INNOMINATE UNI R MOD SED: IMG5365

## 2018-11-28 HISTORY — PX: IR US GUIDE VASC ACCESS RIGHT: IMG2390

## 2018-11-28 HISTORY — PX: IR ANGIO INTRA EXTRACRAN SEL COM CAROTID INNOMINATE UNI R MOD SED: IMG5359

## 2018-11-28 LAB — CBC
HCT: 42.9 % (ref 36.0–46.0)
Hemoglobin: 14.5 g/dL (ref 12.0–15.0)
MCH: 31.3 pg (ref 26.0–34.0)
MCHC: 33.8 g/dL (ref 30.0–36.0)
MCV: 92.5 fL (ref 80.0–100.0)
Platelets: 280 10*3/uL (ref 150–400)
RBC: 4.64 MIL/uL (ref 3.87–5.11)
RDW: 13.2 % (ref 11.5–15.5)
WBC: 7.9 10*3/uL (ref 4.0–10.5)
nRBC: 0 % (ref 0.0–0.2)

## 2018-11-28 LAB — PROTIME-INR
INR: 1 (ref 0.8–1.2)
Prothrombin Time: 13.2 seconds (ref 11.4–15.2)

## 2018-11-28 LAB — APTT: aPTT: 26 seconds (ref 24–36)

## 2018-11-28 MED ORDER — HEPARIN SODIUM (PORCINE) 1000 UNIT/ML IJ SOLN
INTRAMUSCULAR | Status: AC | PRN
  Administered 2018-11-28: 2000 [IU] via INTRA_ARTERIAL

## 2018-11-28 MED ORDER — NITROGLYCERIN 1 MG/10 ML FOR IR/CATH LAB
INTRA_ARTERIAL | Status: AC | PRN
  Administered 2018-11-28 (×2): 200 ug via INTRA_ARTERIAL

## 2018-11-28 MED ORDER — VERAPAMIL HCL 2.5 MG/ML IV SOLN
INTRAVENOUS | Status: AC
  Filled 2018-11-28: qty 2

## 2018-11-28 MED ORDER — LIDOCAINE HCL 1 % IJ SOLN
INTRAMUSCULAR | Status: AC | PRN
  Administered 2018-11-28: 15 mL

## 2018-11-28 MED ORDER — LIDOCAINE HCL 1 % IJ SOLN
INTRAMUSCULAR | Status: AC
  Filled 2018-11-28: qty 20

## 2018-11-28 MED ORDER — FENTANYL CITRATE (PF) 100 MCG/2ML IJ SOLN
INTRAMUSCULAR | Status: AC
  Filled 2018-11-28: qty 2

## 2018-11-28 MED ORDER — SODIUM CHLORIDE 0.9 % IV SOLN
INTRAVENOUS | Status: DC
  Administered 2018-11-28: 08:00:00 via INTRAVENOUS

## 2018-11-28 MED ORDER — SODIUM CHLORIDE 0.9 % IV SOLN: INTRAVENOUS | Status: AC

## 2018-11-28 MED ORDER — IOHEXOL 300 MG/ML  SOLN: 66.0000 mL | Freq: Once | INTRAMUSCULAR | Status: DC | PRN

## 2018-11-28 MED ORDER — MIDAZOLAM HCL 2 MG/2ML IJ SOLN
INTRAMUSCULAR | Status: AC
  Filled 2018-11-28: qty 2

## 2018-11-28 MED ORDER — NITROGLYCERIN 1 MG/10 ML FOR IR/CATH LAB
INTRA_ARTERIAL | Status: AC
  Filled 2018-11-28: qty 10

## 2018-11-28 MED ORDER — MIDAZOLAM HCL 2 MG/2ML IJ SOLN
INTRAMUSCULAR | Status: AC | PRN
  Administered 2018-11-28: 1 mg via INTRAVENOUS

## 2018-11-28 MED ORDER — FENTANYL CITRATE (PF) 100 MCG/2ML IJ SOLN
INTRAMUSCULAR | Status: AC | PRN
  Administered 2018-11-28: 25 ug via INTRAVENOUS

## 2018-11-28 MED ORDER — VERAPAMIL HCL 2.5 MG/ML IV SOLN
INTRAVENOUS | Status: AC | PRN
  Administered 2018-11-28: 2.5 mg via INTRA_ARTERIAL

## 2018-11-28 MED ORDER — HEPARIN SODIUM (PORCINE) 1000 UNIT/ML IJ SOLN
INTRAMUSCULAR | Status: AC
  Filled 2018-11-28: qty 1

## 2018-11-28 NOTE — Discharge Instructions (Addendum)
DRINK PLENTY OF FLUIDS FOR THE NEXT 2-3 DAYS TO KEEP HYDRATED.  Radial Site Care  This sheet gives you information about how to care for yourself after your procedure. Your health care provider may also give you more specific instructions. If you have problems or questions, contact your health care provider. What can I expect after the procedure? After the procedure, it is common to have:  Bruising and tenderness at the catheter insertion area. Follow these instructions at home: Medicines  Take over-the-counter and prescription medicines only as told by your health care provider. Insertion site care  Follow instructions from your health care provider about how to take care of your insertion site. Make sure you: ? Wash your hands with soap and water before you change your bandage (dressing). If soap and water are not available, use hand sanitizer. ? Change your dressing as told by your health care provider. ? Leave stitches (sutures), skin glue, or adhesive strips in place. These skin closures may need to stay in place for 2 weeks or longer. If adhesive strip edges start to loosen and curl up, you may trim the loose edges. Do not remove adhesive strips completely unless your health care provider tells you to do that.  Check your insertion site every day for signs of infection. Check for: ? Redness, swelling, or pain. ? Fluid or blood. ? Pus or a bad smell. ? Warmth.  Do not take baths, swim, or use a hot tub until your health care provider approves.  You may shower 24-48 hours after the procedure, or as directed by your health care provider. ? Remove the dressing and gently wash the site with plain soap and water. ? Pat the area dry with a clean towel. ? Do not rub the site. That could cause bleeding.  Do not apply powder or lotion to the site. Activity   For 24 hours after the procedure, or as directed by your health care provider: ? Do not flex or bend the affected  arm. ? Do not push or pull heavy objects with the affected arm. ? Do not drive yourself home from the hospital or clinic. You may drive 24 hours after the procedure unless your health care provider tells you not to. ? Do not operate machinery or power tools.  Do not lift anything that is heavier than 10 lb (4.5 kg), or the limit that you are told, until your health care provider says that it is safe.  Ask your health care provider when it is okay to: ? Return to work or school. ? Resume usual physical activities or sports. ? Resume sexual activity. General instructions  If the catheter site starts to bleed, raise your arm and put firm pressure on the site. If the bleeding does not stop, get help right away. This is a medical emergency.  If you went home on the same day as your procedure, a responsible adult should be with you for the first 24 hours after you arrive home.  Keep all follow-up visits as told by your health care provider. This is important. Contact a health care provider if:  You have a fever.  You have redness, swelling, or yellow drainage around your insertion site. Get help right away if:  You have unusual pain at the radial site.  The catheter insertion area swells very fast.  The insertion area is bleeding, and the bleeding does not stop when you hold steady pressure on the area.  Your arm or  hand becomes pale, cool, tingly, or numb. These symptoms may represent a serious problem that is an emergency. Do not wait to see if the symptoms will go away. Get medical help right away. Call your local emergency services (911 in the [place] Do not drive yourself to the hospital. Summary  After the procedure, it is common to have bruising and tenderness at the site.  Follow instructions from your health care provider about how to take care of your radial site wound. Check the wound every day for signs of infection.  Do not lift anything that is heavier than 10 lb (4.5  kg), or the limit that you are told, until your health care provider says that it is safe. This information is not intended to replace advice given to you by your health care provider. Make sure you discuss any questions you have with your health care provider. Document Released: 07/29/2010 Document Revised: 08/01/2017 Document Reviewed: 08/01/2017 Elsevier Interactive Patient Education  2019 [employer] Cerebral Angiogram  A cerebral angiogram is a procedure that is used to examine the blood vessels in the brain and neck. In this procedure, contrast dye is injected through a long, thin tube (catheter) into an artery. X-rays are then taken, which show if there is a blockage or problem in a blood vessel. Tell a health care provider about:  Any allergies you have, including allergies to shellfish, contrast dye, or iodine  All medicines you are taking, including vitamins, herbs, eye drops, creams, and over-the-counter medicines.  Any problems you or family members have had with anesthetic medicines.  Any blood disorders you have.  Any surgeries you have had.  Any medical conditions you have.  Whether you are pregnant or may be pregnant.  Whether you are currently breastfeeding. What are the risks? Generally, this is a safe procedure. However, problems may occur, including:  Damage to surrounding nerves, tissues, or structures.  Blood clot.  Inability to remember what happened (amnesia). This is usually temporary.  Weakness, numbness, speech, or vision problems. This is usually temporary.  Stroke.  Kidney injury.  Bleeding or bruising.  Allergic reaction medicines or dyes.  Infection. What happens before the procedure? Staying hydrated Follow instructions from your health care provider about hydration, which may include:  Up to 2 hours before the procedure - you may continue to drink clear liquids, such as water, clear fruit juice, black coffee, and plain tea. Eating  and drinking restrictions Follow instructions from your health care provider about eating and drinking, which may include:  8 hours before the procedure - stop eating heavy meals or foods such as meat, fried foods, or fatty foods.  6 hours before the procedure - stop eating light meals or foods, such as toast or cereal.  6 hours before the procedure - stop drinking milk or drinks that contain milk.  2 hours before the procedure - stop drinking clear liquids. General instructions  Ask your health care provider about: ? Changing or stopping your regular medicines. This is especially important if you are taking diabetes medicines or blood thinners. ? Taking medicines such as aspirin or ibuprofen. These medicines can thin your blood. Do not take these medicines before your procedure if your health care provider asks you not to.  You may have blood tests done.  Plan to have someone take you home from the hospital or clinic.  If you will be going home right after the procedure, plan to have someone with you for 24 hours. What  happens during the procedure?  To reduce your risk of infection: ? Your health care team will wash or sanitize their hands. ? Your skin will be washed with soap. ? Hair may be removed from the surgical area.  You will lie on your back on an imaging bed with an X-ray machine around you.  Your head will be secured to the bed with a strap or device to help you keep still.  An IV tube will be inserted into one of your veins.  You will be given one or more of the following: ? A medicine to help you relax (sedative). ? A medicine to numb the area (local anesthetic) where the catheter will be inserted. This is usually your groin, leg, or arm.  Your heart rate and other vital signs will be watched carefully. You may have electrodes placed on your chest.  A small cut (incision) will be made where the catheter will be inserted.  The catheter will be inserted into an  artery that leads to the head. You may feel slight pressure.  The catheter will be moved through the body up to your neck and brain. X-ray images will help your health care provider bring the catheter to the correct location.  The dye will be injected into the catheter and will travel to your head or neck area. You may feel a warming or burning sensation or notice a strange taste in your mouth as the dye goes through your system.  Images will be taken to show how the dye flows through the area.  While the images are being taken, you may be given instructions on breathing, swallowing, moving, or talking.  When the images are finished, the catheter will be slowly removed.  Pressure will be applied to the skin to stop any bleeding. A tight bandage (dressing) or seal will be applied to the skin.  Your IV will be removed. The procedure may vary among health care providers and hospitals. What happens after the procedure?  Your blood pressure, heart rate, breathing rate, and blood oxygen level will be monitored until the medicines you were given have worn off.  You will be asked to lie flat for several hours. The arm or leg where the catheter was inserted will need to be kept straight while you are in the recovery room.  The insertion site will be watched for bleeding and you will be checked often.  You will be instructed to drink plenty of fluids. This will help wash the contrast dye out of your system.  Do not drive for 24 hours if you received a sedative.  It is up to you to get the results of your procedure. Ask your health care provider, or the department that is doing the procedure, when your results will be ready. Summary  A cerebral angiogram is a procedure that is used to examine the blood vessels in the brain and neck.  In this procedure, contrast dye is injected through a long, thin tube (catheter) into an artery. X-rays are then taken, which show if there is a blockage or  problem in a blood vessel.  You will be given a sedative to help you relax during the procedure. A local anesthetic will be used to numb the area where the catheter is inserted. You may feel pressure when the catheter is inserted, and you may feel a warm sensation when the dye is injected.  After the procedure, you will be asked to lie flat for several hours.  The arm or leg where the catheter was inserted will need to be kept straight while you are in the recovery room. This information is not intended to replace advice given to you by your health care provider. Make sure you discuss any questions you have with your health care provider. Document Released: 11/10/2013 Document Revised: 07/31/2016 Document Reviewed: 07/31/2016 Elsevier Interactive Patient Education  2019 [employer]. Moderate Conscious Sedation, Adult, Care After These instructions provide you with information about caring for yourself after your procedure. Your health care provider may also give you more specific instructions. Your treatment has been planned according to current medical practices, but problems sometimes occur. Call your health care provider if you have any problems or questions after your procedure. What can I expect after the procedure? After your procedure, it is common:  To feel sleepy for several hours.  To feel clumsy and have poor balance for several hours.  To have poor judgment for several hours.  To vomit if you eat too soon. Follow these instructions at home: For at least 24 hours after the procedure:   Do not: ? Participate in activities where you could fall or become injured. ? Drive. ? Use heavy machinery. ? Drink alcohol. ? Take sleeping pills or medicines that cause drowsiness. ? Make important decisions or sign legal documents. ? Take care of children on your own.  Rest. Eating and drinking  Follow the diet recommended by your health care provider.  If you vomit: ? Drink water,  juice, or soup when you can drink without vomiting. ? Make sure you have little or no nausea before eating solid foods. General instructions  Have a responsible adult stay with you until you are awake and alert.  Take over-the-counter and prescription medicines only as told by your health care provider.  If you smoke, do not smoke without supervision.  Keep all follow-up visits as told by your health care provider. This is important. Contact a health care provider if:  You keep feeling nauseous or you keep vomiting.  You feel light-headed.  You develop a rash.  You have a fever. Get help right away if:  You have trouble breathing. This information is not intended to replace advice given to you by your health care provider. Make sure you discuss any questions you have with your health care provider. Document Released: 04/16/2013 Document Revised: 11/29/2015 Document Reviewed: 10/16/2015 Elsevier Interactive Patient Education  2019 [employer].

## 2018-11-28 NOTE — Procedures (Signed)
S/P RT VA and RT common carotid arteriograms. RT Radial approach. 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

## 2018-11-28 NOTE — Sedation Documentation (Signed)
Right radial pulse checked +2, level 0, air in TR band 14.

## 2018-11-28 NOTE — H&P (Addendum)
Chief Complaint: Patient was seen in consultation today for cerebral arteriogram at the request of Dr Metta Clines  Supervising Physician: Luanne Bras  Patient Status: [facility] - Out-pt  History of Present Illness: Sheila Shields is a 84 y.o. female   Pt with known Hx Migraine headaches New type of headache 4/20120 Headache with left sided facial numbness and hand numbness Left hand contracted into fist and could not straighten for 5 minutes Denies vision or speech involvement  CT Head 10/21/18: IMPRESSION: 1. No acute abnormality 2. Atrophy and mild chronic small-vessel white matter ischemic changes  MR 11/05/18:  IMPRESSION: No acute or subacute infarction. Age related atrophy with mild chronic small-vessel ischemic change of the basal ganglia and cerebral hemispheric white matter. No significant carotid disease in the neck. The left vertebral artery is the dominant vessel supplying the basilar. Left vertebral is widely patent. Stenosis of the proximal right vertebral artery, likely 70% or greater. This vessel supplies right PICA and gives a very minimal contribution to the basilar. 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 This could certainly be significant and endovascular treatment could be considered depending on other clinical factors. No large or medium vessel intracranial occlusion. Some atherosclerotic irregularity of the more distal branch vessels diffusely.  She was started on Plavix 75 mg 10/21/18 Has hx of using ASA 81 mg daily  Referred to Dr Estanislado Pandy for evaluation and possible treatment for R ICA aneurysm Now scheduled for cerebral arteriogram   Past Medical History:  Diagnosis Date   Alcoholism in remission ([place]) 08/27/2007   Anemia    Arthritis    OA   COPD 12/17/2006   Diverticulosis 03/27/2007   GERD 05/01/2007   HYPERGLYCEMIA 08/27/2007   HYPERLIPIDEMIA 12/17/2006   HYPERTENSION 12/17/2006   Mood  disorder ([place])    hx anxiety and depression   Rectal polyp 03/27/2007   adenoma    Past Surgical History:  Procedure Laterality Date   ABDOMINAL HYSTERECTOMY  1978   fibroma   APPENDECTOMY  2002   BREAST BIOPSY     negative   BREAST EXCISIONAL BIOPSY Right    2003   CARPAL TUNNEL RELEASE Left 2010 or 2011   COLONOSCOPY W/ POLYPECTOMY  03/27/2007; n7/19/2012   2008: 4 mm adenoma, diverticulosis 2012: ileitis ? NSAID - likely, 2-3 mm cecal polyp LYMPHOID FOLLICLE, diverticulosis   ESOPHAGOGASTRODUODENOSCOPY  01/26/2011   reflux esophagitis, colonscopy done also   HAMMER TOE SURGERY  2008   left foot   HERNIA REPAIR Right 1996?   ORIF ANKLE FRACTURE Right 12/22/2014   Procedure: OPEN REDUCTION INTERNAL FIXATION (ORIF) ANKLE FRACTURE;  Surgeon: Susa Day, MD;  Location: [facility] ORS;  Service: Orthopedics;  Laterality: Right;   SHOULDER OPEN ROTATOR CUFF REPAIR Left 03/06/2013   Procedure: LEFT ROTATOR CUFF REPAIR, SUBACROMIAL DECOMPRESSION, PATCH GRAFT, MANIPULATION UNDER ANESTHESIA;  Surgeon: Johnn Hai, MD;  Location: [facility] OR[facility];  Service: Orthopedics;  Laterality: Left;    Allergies: Alcohol; Codeine; and Dilaudid [hydromorphone hcl]  Medications: Prior to Admission medications   Medication Sig Start Date End Date Taking? Authorizing Provider  acetaminophen ([employer] ARTHRITIS PAIN RELIEF) 650 MG CR tablet Take 1,300 mg by mouth 2 (two) times daily.   Yes [provider]  atorvastatin (LIPITOR) 10 MG tablet Take 10 mg by mouth daily.   Yes [provider]  benzonatate (TESSALON) 200 MG capsule TAKE ONE CAPSULE BY MOUTH THREE TIMES A DAY AS NEEDED FOR COUGH Patient taking  differently: Take 200 mg by mouth 2 (two) times a day.  11/20/18  Yes Collene Gobble, MD  clopidogrel (PLAVIX) 75 MG tablet Take 1 tablet (75 mg total) by mouth daily. 10/22/18  Yes Jaffe, Adam R, DO  Coenzyme Q10 (COQ10 PO) Take 1 capsule by mouth daily.    Yes [provider]    ferrous sulfate 325 (65 FE) MG tablet TAKE 1 TABLET BY MOUTH EVERY DAY 07/26/15  Yes Dutch Quint B, FNP  fexofenadine (ALLEGRA) 180 MG tablet Take 180 mg by mouth daily.   Yes [provider]  fluticasone (FLONASE) 50 MCG/ACT nasal spray Place 2 sprays into both nostrils daily. 09/24/18  Yes Collene Gobble, MD  hydrochlorothiazide (HYDRODIURIL) 25 MG tablet TAKE ONE TABLET BY MOUTH DAILY 05/02/18  Yes Lucretia Kern, DO  Multiple Vitamin (MULTIVITAMIN WITH MINERALS) TABS tablet Take 1 tablet by mouth daily.   Yes [provider]  pantoprazole (PROTONIX) 40 MG tablet Take 1 tablet (40 mg total) by mouth daily. 12/26/17  Yes Zehr, Laban Emperor, PA-C  potassium chloride SA (K-DUR,KLOR-CON) 20 MEQ tablet TAKE 1 TABLET BY MOUTH DAILY 08/01/18  Yes Lucretia Kern, DO  traMADol (ULTRAM) 50 MG tablet Take 50 mg by mouth every 6 (six) hours as needed (pain).   Yes [provider]  Vitamin D, Cholecalciferol, 1000 units CAPS Take 1,000 Units by mouth daily.    Yes [provider]  albuterol (PROAIR HFA) 108 (90 Base) MCG/ACT inhaler INHALE 2 PUFFS EVERY 4 HOURS AS NEEDED FOR COUGHING SPELLS 12/15/16   Lucretia Kern, DO  meclizine (ANTIVERT) 25 MG tablet Take 1 tablet (25 mg total) by mouth 3 (three) times daily as needed for dizziness. 08/06/17   Lucretia Kern, DO     Family History  Problem Relation Age of Onset   Throat cancer Mother    Heart attack Father 59   Idiopathic pulmonary fibrosis Brother 52   Cancer Brother        sarcoma   Heart disease Brother    Colon cancer Neg Hx    Esophageal cancer Neg Hx    Stomach cancer Neg Hx     Social History   Socioeconomic History   Marital status: Married    Spouse name: Not on file   Number of children: 2   Years of education: Not on file   Highest education level: Not on file  Occupational History   Occupation: retired  Scientist, product/process development strain: Not on file   Food insecurity:     Worry: Not on file    Inability: Not on file   Transportation needs:    Medical: Not on file    Non-medical: Not on file  Tobacco Use   Smoking status: Former Smoker    Packs/day: 2.00    Years: 30.00    Pack years: 60.00    Types: Cigarettes    Last attempt to quit: 07/10/1984    Years since quitting: 34.4   Smokeless tobacco: Never Used  Substance and Sexual Activity   Alcohol use: No    Alcohol/week: 0.0 standard drinks    Comment: no alcoho since 1983   Drug use: No   Sexual activity: Not on file  Lifestyle   Physical activity:    Days per week: Not on file    Minutes per session: Not on file   Stress: Not on file  Relationships   Social connections:  Talks on phone: Not on file    Gets together: Not on file    Attends religious service: Not on file    Active member of club or organization: Not on file    Attends meetings of clubs or organizations: Not on file    Relationship status: Not on file  Other Topics Concern   Not on file  Social History Narrative   Work or School: very active in [employer] Situation: lives with husband      Spiritual Beliefs: Christian      Lifestyle:tries to walk; diet ok - denies any alcohol or tobacco use in many many years       Review of Systems: A 12 point ROS discussed and pertinent positives are indicated in the HPI above.  All other systems are negative.  Review of Systems  Constitutional: Negative for activity change, fatigue and fever.  HENT: Negative for tinnitus and trouble swallowing.   Eyes: Negative for visual disturbance.  Respiratory: Positive for cough and shortness of breath.   Cardiovascular: Negative for chest pain.  Gastrointestinal: Negative for abdominal pain.  Musculoskeletal: Negative for back pain.  Neurological: Positive for headaches. Negative for dizziness, tremors, seizures, syncope, facial asymmetry, speech difficulty, weakness, light-headedness and numbness.    Psychiatric/Behavioral: Negative for behavioral problems and confusion.    Vital Signs: BP (!) 156/83    Pulse 74    Temp (!) 97.5 F (36.4 C) (Oral)    Resp 16    Ht 5\' 5"  (1.651 m)    Wt 186 lb (84.4 kg)    SpO2 97%    BMI 30.95 kg/m   Physical Exam Vitals signs reviewed.  HENT:     Head: Atraumatic.  Eyes:     Extraocular Movements: Extraocular movements intact.  Neck:     Musculoskeletal: Normal range of motion.  Cardiovascular:     Rate and Rhythm: Normal rate and regular rhythm.     Heart sounds: Normal heart sounds.  Pulmonary:     Effort: Pulmonary effort is normal.     Breath sounds: Normal breath sounds.  Abdominal:     General: Bowel sounds are normal.  Musculoskeletal: Normal range of motion.  Skin:    General: Skin is warm and dry.  Neurological:     Mental Status: She is alert and oriented to person, place, and time.  Psychiatric:        Mood and Affect: Mood normal.        Behavior: Behavior normal.        Thought Content: Thought content normal.        Judgment: Judgment normal.     Imaging: Mr Virgel Paling Wo Contrast  Result Date: 11/05/2018 CLINICAL DATA:  Possible TIA 2 weeks ago with left-sided facial numbness and left arm numbness followed by headache. Creatinine was obtained on site at [place] at [address] Results: Creatinine 0.8 mg/dL. EXAM: MRI HEAD WITHOUT  CONTRAST MRA HEAD WITHOUT CONTRAST MRA NECK WITHOUT AND WITH CONTRAST TECHNIQUE: Multiplanar, multiecho pulse sequences of the brain and surrounding structures were obtained without and with intravenous contrast. Angiographic images of the Circle of Willis were obtained using MRA technique without intravenous contrast. Angiographic images of the neck were obtained using MRA technique without and with intravenous contrast. Carotid stenosis measurements (when applicable) are obtained utilizing NASCET criteria, using the distal internal carotid diameter as the denominator.  CONTRAST:  95mL MULTIHANCE GADOBENATE DIMEGLUMINE 529  MG/ML IV SOLN COMPARISON:  None. FINDINGS: MRI HEAD FINDINGS Brain: Diffusion imaging does not show any acute or subacute infarction. Brainstem and cerebellum do not show any focal insult. Cerebral hemispheres show age related volume loss with mild chronic small-vessel ischemic change of the cerebral hemispheric white matter and basal ganglia. No cortical or large vessel territory infarction. No mass lesion, hemorrhage, hydrocephalus or extra-axial collection. Vascular: Major vessels at the base of the brain show flow. Skull and upper cervical spine: Negative Sinuses/Orbits: Clear/normal Other: None MRA HEAD FINDINGS Both internal carotid arteries are widely patent through the skull base and siphon regions. The anterior and middle cerebral vessels are patent without proximal stenosis, aneurysm or vascular malformation. There is an aneurysm of the supraclinoid ICA on the right measuring up to 8 mm in diameter. Mild ectasia of the supraclinoid ICA on the left without a definite aneurysm. Probable infundibulum related to fetal origin PCA on this side. No antegrade flow demonstrated in the right vertebral artery at the foramen magnum level. The left vertebral artery is widely patent to the basilar. The basilar artery is tortuous but widely patent. Mild ectasia of the basilar tip without a frank aneurysm. Both posterior cerebral arteries show flow, with some distal atherosclerotic narrowing and irregularity. MRA NECK FINDINGS No sign of aortic aneurysm or dissection. Branching pattern is normal without origin stenosis. Both common carotid arteries are widely patent to their respective bifurcation. Mild atherosclerotic irregularity of the internal carotid artery bulbs but no stenosis. Cervical internal carotid arteries are tortuous but widely patent. The left vertebral artery is dominant. No vertebral artery origin stenosis seen on that side. This vessel is widely  patent to the basilar. The right vertebral artery is non dominant with a stenosis at its origin likely 70% or greater. Beyond that, the vessel is patent, serving PICA. There is a very tiny communication beyond that to the basilar. IMPRESSION: No acute or subacute infarction. Age related atrophy with mild chronic small-vessel ischemic change of the basal ganglia and cerebral hemispheric white matter. No significant carotid disease in the neck. The left vertebral artery is the dominant vessel supplying the basilar. Left vertebral is widely patent. Stenosis of the proximal right vertebral artery, likely 70% or greater. This vessel supplies right PICA and gives a very minimal contribution to the basilar. 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 This could certainly be significant and endovascular treatment could be considered depending on other clinical factors. No large or medium vessel intracranial occlusion. Some atherosclerotic irregularity of the more distal branch vessels diffusely. Electronically Signed   By: Nelson Chimes M.D.   On: 11/05/2018 16:38   Mr Jodene Nam Neck W Wo Contrast  Result Date: 11/05/2018 CLINICAL DATA:  Possible TIA 2 weeks ago with left-sided facial numbness and left arm numbness followed by headache. Creatinine was obtained on site at [place] at [address] Results: Creatinine 0.8 mg/dL. EXAM: MRI HEAD WITHOUT  CONTRAST MRA HEAD WITHOUT CONTRAST MRA NECK WITHOUT AND WITH CONTRAST TECHNIQUE: Multiplanar, multiecho pulse sequences of the brain and surrounding structures were obtained without and with intravenous contrast. Angiographic images of the Circle of Willis were obtained using MRA technique without intravenous contrast. Angiographic images of the neck were obtained using MRA technique without and with intravenous contrast. Carotid stenosis measurements (when applicable) are obtained utilizing NASCET criteria, using the distal internal  carotid diameter as the denominator. CONTRAST:  54mL MULTIHANCE GADOBENATE DIMEGLUMINE 529 MG/ML IV SOLN COMPARISON:  None. FINDINGS: MRI HEAD FINDINGS Brain: Diffusion imaging does not show any acute or subacute infarction. Brainstem and cerebellum do not show any focal insult. Cerebral hemispheres show age related volume loss with mild chronic small-vessel ischemic change of the cerebral hemispheric white matter and basal ganglia. No cortical or large vessel territory infarction. No mass lesion, hemorrhage, hydrocephalus or extra-axial collection. Vascular: Major vessels at the base of the brain show flow. Skull and upper cervical spine: Negative Sinuses/Orbits: Clear/normal Other: None MRA HEAD FINDINGS Both internal carotid arteries are widely patent through the skull base and siphon regions. The anterior and middle cerebral vessels are patent without proximal stenosis, aneurysm or vascular malformation. There is an aneurysm of the supraclinoid ICA on the right measuring up to 8 mm in diameter. Mild ectasia of the supraclinoid ICA on the left without a definite aneurysm. Probable infundibulum related to fetal origin PCA on this side. No antegrade flow demonstrated in the right vertebral artery at the foramen magnum level. The left vertebral artery is widely patent to the basilar. The basilar artery is tortuous but widely patent. Mild ectasia of the basilar tip without a frank aneurysm. Both posterior cerebral arteries show flow, with some distal atherosclerotic narrowing and irregularity. MRA NECK FINDINGS No sign of aortic aneurysm or dissection. Branching pattern is normal without origin stenosis. Both common carotid arteries are widely patent to their respective bifurcation. Mild atherosclerotic irregularity of the internal carotid artery bulbs but no stenosis. Cervical internal carotid arteries are tortuous but widely patent. The left vertebral artery is dominant. No vertebral artery origin stenosis seen on  that side. This vessel is widely patent to the basilar. The right vertebral artery is non dominant with a stenosis at its origin likely 70% or greater. Beyond that, the vessel is patent, serving PICA. There is a very tiny communication beyond that to the basilar. IMPRESSION: No acute or subacute infarction. Age related atrophy with mild chronic small-vessel ischemic change of the basal ganglia and cerebral hemispheric white matter. No significant carotid disease in the neck. The left vertebral artery is the dominant vessel supplying the basilar. Left vertebral is widely patent. Stenosis of the proximal right vertebral artery, likely 70% or greater. This vessel supplies right PICA and gives a very minimal contribution to the basilar. 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 This could certainly be significant and endovascular treatment could be considered depending on other clinical factors. No large or medium vessel intracranial occlusion. Some atherosclerotic irregularity of the more distal branch vessels diffusely. Electronically Signed   By: Nelson Chimes M.D.   On: 11/05/2018 16:38   Mr Brain Wo Contrast  Result Date: 11/05/2018 CLINICAL DATA:  Possible TIA 2 weeks ago with left-sided facial numbness and left arm numbness followed by headache. Creatinine was obtained on site at [place] at [address] Results: Creatinine 0.8 mg/dL. EXAM: MRI HEAD WITHOUT  CONTRAST MRA HEAD WITHOUT CONTRAST MRA NECK WITHOUT AND WITH CONTRAST TECHNIQUE: Multiplanar, multiecho pulse sequences of the brain and surrounding structures were obtained without and with intravenous contrast. Angiographic images of the Circle of Willis were obtained using MRA technique without intravenous contrast. Angiographic images of the neck were obtained using MRA technique without and with intravenous contrast. Carotid stenosis measurements (when applicable) are obtained utilizing NASCET criteria,  using the distal internal carotid diameter as the denominator. CONTRAST:  54mL MULTIHANCE GADOBENATE DIMEGLUMINE 529 MG/ML IV SOLN COMPARISON:  None. FINDINGS: MRI HEAD FINDINGS Brain: Diffusion  imaging does not show any acute or subacute infarction. Brainstem and cerebellum do not show any focal insult. Cerebral hemispheres show age related volume loss with mild chronic small-vessel ischemic change of the cerebral hemispheric white matter and basal ganglia. No cortical or large vessel territory infarction. No mass lesion, hemorrhage, hydrocephalus or extra-axial collection. Vascular: Major vessels at the base of the brain show flow. Skull and upper cervical spine: Negative Sinuses/Orbits: Clear/normal Other: None MRA HEAD FINDINGS Both internal carotid arteries are widely patent through the skull base and siphon regions. The anterior and middle cerebral vessels are patent without proximal stenosis, aneurysm or vascular malformation. There is an aneurysm of the supraclinoid ICA on the right measuring up to 8 mm in diameter. Mild ectasia of the supraclinoid ICA on the left without a definite aneurysm. Probable infundibulum related to fetal origin PCA on this side. No antegrade flow demonstrated in the right vertebral artery at the foramen magnum level. The left vertebral artery is widely patent to the basilar. The basilar artery is tortuous but widely patent. Mild ectasia of the basilar tip without a frank aneurysm. Both posterior cerebral arteries show flow, with some distal atherosclerotic narrowing and irregularity. MRA NECK FINDINGS No sign of aortic aneurysm or dissection. Branching pattern is normal without origin stenosis. Both common carotid arteries are widely patent to their respective bifurcation. Mild atherosclerotic irregularity of the internal carotid artery bulbs but no stenosis. Cervical internal carotid arteries are tortuous but widely patent. The left vertebral artery is dominant. No vertebral  artery origin stenosis seen on that side. This vessel is widely patent to the basilar. The right vertebral artery is non dominant with a stenosis at its origin likely 70% or greater. Beyond that, the vessel is patent, serving PICA. There is a very tiny communication beyond that to the basilar. IMPRESSION: No acute or subacute infarction. Age related atrophy with mild chronic small-vessel ischemic change of the basal ganglia and cerebral hemispheric white matter. No significant carotid disease in the neck. The left vertebral artery is the dominant vessel supplying the basilar. Left vertebral is widely patent. Stenosis of the proximal right vertebral artery, likely 70% or greater. This vessel supplies right PICA and gives a very minimal contribution to the basilar. 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 This could certainly be significant and endovascular treatment could be considered depending on other clinical factors. No large or medium vessel intracranial occlusion. Some atherosclerotic irregularity of the more distal branch vessels diffusely. Electronically Signed   By: Nelson Chimes M.D.   On: 11/05/2018 16:38    Labs:  CBC: Recent Labs    04/08/18 0835 08/08/18 0836 11/14/18 0923 11/28/18 0720  WBC 6.6 6.6 7.4 7.9  HGB 14.4 14.7 14.6 14.5  HCT 42.0 43.1 42.6 42.9  PLT 257.0 240.0 272.0 280    COAGS: Recent Labs    11/28/18 0720  INR 1.0  APTT 26    BMP: Recent Labs    04/08/18 0835 08/08/18 0836 11/14/18 0923  NA 145 143 142  K 4.4 3.7 3.7  CL 108 103 101  CO2 28 31 31   GLUCOSE 103* 97 104*  BUN 22 13 15   CALCIUM 9.7 9.8 9.6  CREATININE 0.72 0.73 0.74    LIVER FUNCTION TESTS: Recent Labs    11/14/18 0923  BILITOT 0.8  AST 19  ALT 14  ALKPHOS 71  PROT 6.3  ALBUMIN 4.1    TUMOR MARKERS: No results for input(s): AFPTM,  CEA, CA199, CHROMGRNA in the last 8760 hours.  Assessment and Plan:  Hx Migraines headaches New type of  headache with associated L side numbness 10/21/18 Imaging revealing R ICA aneurysm On Plavix ands ASA daily Now scheduled for Cerebral arteriogram Risks and benefits of cerebral angiogram with intervention were discussed with the patient including, but not limited to bleeding, infection, vascular injury, contrast induced renal failure, stroke or even death.  This interventional procedure involves the use of X-rays and because of the nature of the planned procedure, it is possible that we will have prolonged use of X-ray fluoroscopy.  Potential radiation risks to you include (but are not limited to) the following: - A slightly elevated risk for cancer  several years later in life. This risk is typically less than 0.5% percent. This risk is low in comparison to the normal incidence of human cancer, which is 33% for women and 50% for men according to the [place]. - Radiation induced injury can include skin redness, resembling a rash, tissue breakdown / ulcers and hair loss (which can be temporary or permanent).   The likelihood of either of these occurring depends on the difficulty of the procedure and whether you are sensitive to radiation due to previous procedures, disease, or genetic conditions.   IF your procedure requires a prolonged use of radiation, you will be notified and given written instructions for further action.  It is your responsibility to monitor the irradiated area for the 2 weeks following the procedure and to notify your physician if you are concerned that you have suffered a radiation induced injury.    All of the patient's questions were answered, patient is agreeable to proceed.  Consent signed and in chart.  Thank you for this interesting consult.  I greatly enjoyed meeting LATIFA NOBLE and look forward to participating in their care.  A copy of this report was sent to the requesting provider on this date.  Electronically Signed: Lavonia Drafts,  PA-C 11/28/2018, 8:02 AM   I spent a total of  30 Minutes   in face to face in clinical consultation, greater than 50% of which was counseling/coordinating care for cerebral arteriogram

## 2018-12-02 ENCOUNTER — Encounter: Payer: Self-pay | Admitting: Family Medicine

## 2018-12-03 ENCOUNTER — Encounter (HOSPITAL_COMMUNITY): Payer: Self-pay | Admitting: Interventional Radiology

## 2018-12-04 ENCOUNTER — Encounter: Payer: Self-pay | Admitting: Family Medicine

## 2018-12-05 ENCOUNTER — Encounter: Payer: Self-pay | Admitting: Family Medicine

## 2018-12-05 MED ORDER — ASPIRIN EC 81 MG PO TBEC: 81.0000 mg | DELAYED_RELEASE_TABLET | Freq: Every day | ORAL | Status: DC

## 2018-12-09 ENCOUNTER — Ambulatory Visit: Payer: Self-pay | Admitting: Family Medicine

## 2018-12-10 DIAGNOSIS — L821 Other seborrheic keratosis: Secondary | ICD-10-CM | POA: Diagnosis not present

## 2018-12-10 DIAGNOSIS — L218 Other seborrheic dermatitis: Secondary | ICD-10-CM | POA: Diagnosis not present

## 2018-12-10 DIAGNOSIS — L812 Freckles: Secondary | ICD-10-CM | POA: Diagnosis not present

## 2018-12-10 DIAGNOSIS — Z85820 Personal history of malignant melanoma of skin: Secondary | ICD-10-CM | POA: Diagnosis not present

## 2018-12-10 DIAGNOSIS — L820 Inflamed seborrheic keratosis: Secondary | ICD-10-CM | POA: Diagnosis not present

## 2018-12-10 DIAGNOSIS — D1801 Hemangioma of skin and subcutaneous tissue: Secondary | ICD-10-CM | POA: Diagnosis not present

## 2018-12-10 DIAGNOSIS — L720 Epidermal cyst: Secondary | ICD-10-CM | POA: Diagnosis not present

## 2018-12-11 ENCOUNTER — Ambulatory Visit: Payer: Self-pay

## 2018-12-11 DIAGNOSIS — R69 Illness, unspecified: Secondary | ICD-10-CM | POA: Diagnosis not present

## 2018-12-13 ENCOUNTER — Encounter: Payer: Self-pay | Admitting: Family Medicine

## 2018-12-13 ENCOUNTER — Other Ambulatory Visit (HOSPITAL_COMMUNITY): Payer: Self-pay | Admitting: Interventional Radiology

## 2018-12-13 DIAGNOSIS — I671 Cerebral aneurysm, nonruptured: Secondary | ICD-10-CM

## 2018-12-25 ENCOUNTER — Other Ambulatory Visit (HOSPITAL_COMMUNITY): Admission: RE | Admit: 2018-12-25 | Payer: Medicare HMO | Source: Ambulatory Visit

## 2018-12-26 ENCOUNTER — Other Ambulatory Visit: Payer: Self-pay | Admitting: Radiology

## 2018-12-27 ENCOUNTER — Other Ambulatory Visit: Payer: Self-pay

## 2018-12-27 ENCOUNTER — Other Ambulatory Visit (HOSPITAL_COMMUNITY)
Admission: RE | Admit: 2018-12-27 | Discharge: 2018-12-27 | Disposition: A | Discharge status: Home / Self Care | Payer: Medicare HMO | Source: Ambulatory Visit | Attending: Interventional Radiology | Admitting: Interventional Radiology

## 2018-12-27 ENCOUNTER — Encounter (HOSPITAL_COMMUNITY): Payer: Self-pay | Admitting: *Deleted

## 2018-12-27 DIAGNOSIS — Z1159 Encounter for screening for other viral diseases: Secondary | ICD-10-CM | POA: Diagnosis not present

## 2018-12-27 LAB — SARS CORONAVIRUS 2 (TAT 6-24 HRS): SARS Coronavirus 2: NEGATIVE

## 2018-12-27 NOTE — Progress Notes (Signed)
Spoke with pt for pre-op call. Pt denies cardiac history or chest pain. Pt does have sob with exertion due to her COPD. Pt states she is not a diabetic. Pt confirms that she is taking her Plavix and Aspirin as instructed.   Pt had her Covid test done this AM. Pt states she and her husband are in quarantine.   Coronavirus Screening  Have you experienced the following symptoms:  Cough NO Fever (>100.54F) NO Runny nose NO Sore throat NO Difficulty breathing/shortness of breath NO  Have you or a family member traveled in the last 14 days and where? NO    Patient reminded that hospital visitation restrictions are in effect and the importance of the restrictions.

## 2018-12-29 ENCOUNTER — Encounter: Payer: Self-pay | Admitting: Family Medicine

## 2018-12-30 ENCOUNTER — Ambulatory Visit (HOSPITAL_COMMUNITY): Payer: Medicare HMO | Admitting: Certified Registered Nurse Anesthetist

## 2018-12-30 ENCOUNTER — Encounter (HOSPITAL_COMMUNITY): Payer: Self-pay | Admitting: *Deleted

## 2018-12-30 ENCOUNTER — Inpatient Hospital Stay (HOSPITAL_COMMUNITY)
Admission: AD | Admit: 2018-12-30 | Discharge: 2018-12-31 | DRG: 027 | Disposition: A | Discharge status: Home / Self Care | Payer: Medicare HMO | Attending: Interventional Radiology | Admitting: Interventional Radiology

## 2018-12-30 ENCOUNTER — Encounter (HOSPITAL_COMMUNITY)
Admission: AD | Disposition: A | Discharge status: Home / Self Care | Payer: Self-pay | Source: Home / Self Care | Attending: Interventional Radiology

## 2018-12-30 ENCOUNTER — Ambulatory Visit (HOSPITAL_COMMUNITY)
Admission: RE | Admit: 2018-12-30 | Discharge: 2018-12-30 | Disposition: A | Discharge status: Home / Self Care | Payer: Medicare HMO | Source: Ambulatory Visit | Attending: Interventional Radiology | Admitting: Interventional Radiology

## 2018-12-30 ENCOUNTER — Other Ambulatory Visit: Payer: Self-pay

## 2018-12-30 DIAGNOSIS — R69 Illness, unspecified: Secondary | ICD-10-CM | POA: Diagnosis not present

## 2018-12-30 DIAGNOSIS — R51 Headache: Secondary | ICD-10-CM | POA: Diagnosis not present

## 2018-12-30 DIAGNOSIS — E785 Hyperlipidemia, unspecified: Secondary | ICD-10-CM | POA: Diagnosis present

## 2018-12-30 DIAGNOSIS — Z8249 Family history of ischemic heart disease and other diseases of the circulatory system: Secondary | ICD-10-CM | POA: Diagnosis not present

## 2018-12-30 DIAGNOSIS — I671 Cerebral aneurysm, nonruptured: Principal | ICD-10-CM | POA: Diagnosis present

## 2018-12-30 DIAGNOSIS — Z836 Family history of other diseases of the respiratory system: Secondary | ICD-10-CM

## 2018-12-30 DIAGNOSIS — Z7982 Long term (current) use of aspirin: Secondary | ICD-10-CM | POA: Diagnosis not present

## 2018-12-30 DIAGNOSIS — K219 Gastro-esophageal reflux disease without esophagitis: Secondary | ICD-10-CM | POA: Diagnosis present

## 2018-12-30 DIAGNOSIS — Z87891 Personal history of nicotine dependence: Secondary | ICD-10-CM | POA: Diagnosis not present

## 2018-12-30 DIAGNOSIS — I10 Essential (primary) hypertension: Secondary | ICD-10-CM | POA: Diagnosis not present

## 2018-12-30 DIAGNOSIS — Z79891 Long term (current) use of opiate analgesic: Secondary | ICD-10-CM | POA: Diagnosis not present

## 2018-12-30 DIAGNOSIS — D649 Anemia, unspecified: Secondary | ICD-10-CM | POA: Diagnosis present

## 2018-12-30 DIAGNOSIS — M1990 Unspecified osteoarthritis, unspecified site: Secondary | ICD-10-CM | POA: Diagnosis present

## 2018-12-30 DIAGNOSIS — Z885 Allergy status to narcotic agent status: Secondary | ICD-10-CM

## 2018-12-30 DIAGNOSIS — Z9889 Other specified postprocedural states: Secondary | ICD-10-CM | POA: Diagnosis not present

## 2018-12-30 DIAGNOSIS — Z8673 Personal history of transient ischemic attack (TIA), and cerebral infarction without residual deficits: Secondary | ICD-10-CM | POA: Diagnosis not present

## 2018-12-30 DIAGNOSIS — J449 Chronic obstructive pulmonary disease, unspecified: Secondary | ICD-10-CM | POA: Diagnosis present

## 2018-12-30 DIAGNOSIS — Z79899 Other long term (current) drug therapy: Secondary | ICD-10-CM | POA: Diagnosis not present

## 2018-12-30 DIAGNOSIS — Z7902 Long term (current) use of antithrombotics/antiplatelets: Secondary | ICD-10-CM

## 2018-12-30 DIAGNOSIS — Z7951 Long term (current) use of inhaled steroids: Secondary | ICD-10-CM

## 2018-12-30 DIAGNOSIS — I97618 Postprocedural hemorrhage and hematoma of a circulatory system organ or structure following other circulatory system procedure: Secondary | ICD-10-CM | POA: Diagnosis not present

## 2018-12-30 DIAGNOSIS — K91840 Postprocedural hemorrhage and hematoma of a digestive system organ or structure following a digestive system procedure: Secondary | ICD-10-CM | POA: Diagnosis not present

## 2018-12-30 DIAGNOSIS — F1021 Alcohol dependence, in remission: Secondary | ICD-10-CM | POA: Diagnosis present

## 2018-12-30 HISTORY — DX: Pneumonia, unspecified organism: J18.9

## 2018-12-30 HISTORY — PX: RADIOLOGY WITH ANESTHESIA: SHX6223

## 2018-12-30 HISTORY — PX: IR TRANSCATH/EMBOLIZ: IMG695

## 2018-12-30 HISTORY — DX: Transient cerebral ischemic attack, unspecified: G45.9

## 2018-12-30 HISTORY — PX: IR ANGIO INTRA EXTRACRAN SEL INTERNAL CAROTID UNI R MOD SED: IMG5362

## 2018-12-30 HISTORY — PX: IR ANGIOGRAM FOLLOW UP STUDY: IMG697

## 2018-12-30 LAB — CBC WITH DIFFERENTIAL/PLATELET
Abs Immature Granulocytes: 0.03 10*3/uL (ref 0.00–0.07)
Basophils Absolute: 0.1 10*3/uL (ref 0.0–0.1)
Basophils Relative: 1 %
Eosinophils Absolute: 0.5 10*3/uL (ref 0.0–0.5)
Eosinophils Relative: 6 %
HCT: 43.4 % (ref 36.0–46.0)
Hemoglobin: 14.3 g/dL (ref 12.0–15.0)
Immature Granulocytes: 0 %
Lymphocytes Relative: 19 %
Lymphs Abs: 1.5 10*3/uL (ref 0.7–4.0)
MCH: 30.8 pg (ref 26.0–34.0)
MCHC: 32.9 g/dL (ref 30.0–36.0)
MCV: 93.5 fL (ref 80.0–100.0)
Monocytes Absolute: 1.1 10*3/uL — ABNORMAL HIGH (ref 0.1–1.0)
Monocytes Relative: 13 %
Neutro Abs: 5 10*3/uL (ref 1.7–7.7)
Neutrophils Relative %: 61 %
Platelets: 276 10*3/uL (ref 150–400)
RBC: 4.64 MIL/uL (ref 3.87–5.11)
RDW: 13.2 % (ref 11.5–15.5)
WBC: 8.2 10*3/uL (ref 4.0–10.5)
nRBC: 0 % (ref 0.0–0.2)

## 2018-12-30 LAB — HEPARIN LEVEL (UNFRACTIONATED): Heparin Unfractionated: 0.12 IU/mL — ABNORMAL LOW (ref 0.30–0.70)

## 2018-12-30 LAB — BASIC METABOLIC PANEL
Anion gap: 11 (ref 5–15)
BUN: 15 mg/dL (ref 8–23)
CO2: 25 mmol/L (ref 22–32)
Calcium: 9.9 mg/dL (ref 8.9–10.3)
Chloride: 106 mmol/L (ref 98–111)
Creatinine, Ser: 0.72 mg/dL (ref 0.44–1.00)
GFR calc Af Amer: >60 mL/min (ref >60)
GFR calc non Af Amer: >60 mL/min (ref >60)
Glucose, Bld: 116 mg/dL — ABNORMAL HIGH (ref 70–99)
Potassium: 3.5 mmol/L (ref 3.5–5.1)
Sodium: 142 mmol/L (ref 135–145)

## 2018-12-30 LAB — PLATELET INHIBITION P2Y12: Platelet Function  P2Y12: 42 [PRU] — ABNORMAL LOW (ref 182–335)

## 2018-12-30 LAB — APTT: aPTT: 29 seconds (ref 24–36)

## 2018-12-30 LAB — POCT ACTIVATED CLOTTING TIME
Activated Clotting Time: 197 seconds
Activated Clotting Time: 197 seconds

## 2018-12-30 LAB — MRSA PCR SCREENING: MRSA by PCR: NEGATIVE

## 2018-12-30 LAB — PROTIME-INR
INR: 1 (ref 0.8–1.2)
Prothrombin Time: 12.9 seconds (ref 11.4–15.2)

## 2018-12-30 SURGERY — RADIOLOGY WITH ANESTHESIA
Anesthesia: General

## 2018-12-30 MED ORDER — NIMODIPINE 30 MG PO CAPS
0.0000 mg | ORAL_CAPSULE | ORAL | Status: AC
  Administered 2018-12-30: 60 mg via ORAL
  Filled 2018-12-30: qty 2

## 2018-12-30 MED ORDER — CHLORHEXIDINE GLUCONATE CLOTH 2 % EX PADS
6.0000 | MEDICATED_PAD | Freq: Every day | CUTANEOUS | Status: DC
  Administered 2018-12-30: 6 via TOPICAL

## 2018-12-30 MED ORDER — SODIUM CHLORIDE 0.9 % IV SOLN
INTRAVENOUS | Status: DC
  Administered 2018-12-30: 19:00:00 via INTRAVENOUS

## 2018-12-30 MED ORDER — PHENYLEPHRINE 40 MCG/ML (10ML) SYRINGE FOR IV PUSH (FOR BLOOD PRESSURE SUPPORT)
PREFILLED_SYRINGE | INTRAVENOUS | Status: DC | PRN
  Administered 2018-12-30 (×2): 80 ug via INTRAVENOUS
  Administered 2018-12-30: 40 ug via INTRAVENOUS
  Administered 2018-12-30: 120 ug via INTRAVENOUS
  Administered 2018-12-30: 80 ug via INTRAVENOUS

## 2018-12-30 MED ORDER — ACETAMINOPHEN 325 MG PO TABS
650.0000 mg | ORAL_TABLET | Freq: Four times a day (QID) | ORAL | Status: DC | PRN
  Administered 2018-12-30 – 2018-12-31 (×2): 650 mg via ORAL
  Filled 2018-12-30 (×2): qty 2

## 2018-12-30 MED ORDER — CEFAZOLIN SODIUM-DEXTROSE 2-4 GM/100ML-% IV SOLN
2.0000 g | INTRAVENOUS | Status: AC
  Administered 2018-12-30: 2 g via INTRAVENOUS
  Filled 2018-12-30: qty 100

## 2018-12-30 MED ORDER — HEPARIN (PORCINE) 25000 UT/250ML-% IV SOLN
500.0000 [IU]/h | INTRAVENOUS | Status: DC
  Administered 2018-12-30: 500 [IU]/h via INTRAVENOUS

## 2018-12-30 MED ORDER — ASPIRIN EC 325 MG PO TBEC
325.0000 mg | DELAYED_RELEASE_TABLET | ORAL | Status: DC
  Filled 2018-12-30: qty 1

## 2018-12-30 MED ORDER — PROMETHAZINE HCL 25 MG/ML IJ SOLN: 6.2500 mg | INTRAMUSCULAR | Status: DC | PRN

## 2018-12-30 MED ORDER — FENTANYL CITRATE (PF) 250 MCG/5ML IJ SOLN
INTRAMUSCULAR | Status: DC | PRN
  Administered 2018-12-30 (×4): 50 ug via INTRAVENOUS

## 2018-12-30 MED ORDER — DEXAMETHASONE SODIUM PHOSPHATE 10 MG/ML IJ SOLN
INTRAMUSCULAR | Status: DC | PRN
  Administered 2018-12-30: 10 mg via INTRAVENOUS

## 2018-12-30 MED ORDER — CLOPIDOGREL BISULFATE 75 MG PO TABS: 75.0000 mg | ORAL_TABLET | Freq: Every day | ORAL | Status: DC

## 2018-12-30 MED ORDER — EPTIFIBATIDE 20 MG/10ML IV SOLN
INTRAVENOUS | Status: AC
  Filled 2018-12-30: qty 10

## 2018-12-30 MED ORDER — FENTANYL CITRATE (PF) 100 MCG/2ML IJ SOLN: 25.0000 ug | INTRAMUSCULAR | Status: DC | PRN

## 2018-12-30 MED ORDER — SUCCINYLCHOLINE CHLORIDE 200 MG/10ML IV SOSY
PREFILLED_SYRINGE | INTRAVENOUS | Status: DC | PRN
  Administered 2018-12-30: 100 mg via INTRAVENOUS

## 2018-12-30 MED ORDER — HEPARIN (PORCINE) 25000 UT/250ML-% IV SOLN
600.0000 [IU]/h | INTRAVENOUS | Status: DC
  Administered 2018-12-30: 600 [IU]/h via INTRAVENOUS
  Filled 2018-12-30: qty 250

## 2018-12-30 MED ORDER — PROPOFOL 10 MG/ML IV BOLUS
INTRAVENOUS | Status: DC | PRN
  Administered 2018-12-30: 30 mg via INTRAVENOUS
  Administered 2018-12-30: 90 mg via INTRAVENOUS

## 2018-12-30 MED ORDER — ASPIRIN 81 MG PO CHEW: 324.0000 mg | CHEWABLE_TABLET | Freq: Every day | ORAL | Status: DC

## 2018-12-30 MED ORDER — HEPARIN SODIUM (PORCINE) 1000 UNIT/ML IJ SOLN
INTRAMUSCULAR | Status: DC | PRN
  Administered 2018-12-30: 500 [IU] via INTRAVENOUS
  Administered 2018-12-30: 3000 [IU] via INTRAVENOUS

## 2018-12-30 MED ORDER — ASPIRIN 81 MG PO CHEW
243.0000 mg | CHEWABLE_TABLET | Freq: Once | ORAL | Status: AC
  Administered 2018-12-30: 243 mg via ORAL

## 2018-12-30 MED ORDER — ESMOLOL HCL 100 MG/10ML IV SOLN
INTRAVENOUS | Status: DC | PRN
  Administered 2018-12-30: 30 mg via INTRAVENOUS

## 2018-12-30 MED ORDER — SUGAMMADEX SODIUM 200 MG/2ML IV SOLN
INTRAVENOUS | Status: DC | PRN
  Administered 2018-12-30: 200 mg via INTRAVENOUS

## 2018-12-30 MED ORDER — NITROGLYCERIN 1 MG/10 ML FOR IR/CATH LAB
INTRA_ARTERIAL | Status: AC
  Filled 2018-12-30: qty 10

## 2018-12-30 MED ORDER — ROCURONIUM BROMIDE 10 MG/ML (PF) SYRINGE
PREFILLED_SYRINGE | INTRAVENOUS | Status: DC | PRN
  Administered 2018-12-30: 10 mg via INTRAVENOUS
  Administered 2018-12-30: 50 mg via INTRAVENOUS

## 2018-12-30 MED ORDER — CLOPIDOGREL BISULFATE 75 MG PO TABS
75.0000 mg | ORAL_TABLET | ORAL | Status: DC
  Filled 2018-12-30: qty 1

## 2018-12-30 MED ORDER — CLOPIDOGREL BISULFATE 75 MG PO TABS
75.0000 mg | ORAL_TABLET | Freq: Every day | ORAL | Status: DC
  Administered 2018-12-31: 75 mg via ORAL
  Filled 2018-12-30: qty 1

## 2018-12-30 MED ORDER — SODIUM CHLORIDE 0.9 % IV SOLN: INTRAVENOUS | Status: DC

## 2018-12-30 MED ORDER — EPHEDRINE SULFATE-NACL 50-0.9 MG/10ML-% IV SOSY
PREFILLED_SYRINGE | INTRAVENOUS | Status: DC | PRN
  Administered 2018-12-30 (×3): 5 mg via INTRAVENOUS

## 2018-12-30 MED ORDER — IOHEXOL 300 MG/ML  SOLN
300.0000 mL | Freq: Once | INTRAMUSCULAR | Status: AC | PRN
  Administered 2018-12-30: 100 mL via INTRA_ARTERIAL

## 2018-12-30 MED ORDER — ASPIRIN 325 MG PO TABS
325.0000 mg | ORAL_TABLET | Freq: Every day | ORAL | Status: DC
  Administered 2018-12-31: 325 mg via ORAL
  Filled 2018-12-30: qty 1

## 2018-12-30 MED ORDER — ASPIRIN 81 MG PO CHEW
CHEWABLE_TABLET | ORAL | Status: AC
  Administered 2018-12-30: 243 mg via ORAL
  Filled 2018-12-30: qty 3

## 2018-12-30 MED ORDER — EPTIFIBATIDE 20 MG/10ML IV SOLN
INTRAVENOUS | Status: DC | PRN
  Administered 2018-12-30 (×4): 1.5 mg via INTRAVENOUS

## 2018-12-30 MED ORDER — SODIUM CHLORIDE 0.9 % IV SOLN
INTRAVENOUS | Status: DC | PRN
  Administered 2018-12-30 (×2): via INTRAVENOUS

## 2018-12-30 MED ORDER — ONDANSETRON HCL 4 MG/2ML IJ SOLN
INTRAMUSCULAR | Status: DC | PRN
  Administered 2018-12-30: 4 mg via INTRAVENOUS

## 2018-12-30 MED ORDER — SODIUM CHLORIDE 0.9 % IV SOLN
INTRAVENOUS | Status: DC
  Administered 2018-12-30: 07:00:00 via INTRAVENOUS

## 2018-12-30 MED ORDER — HEPARIN (PORCINE) 25000 UT/250ML-% IV SOLN
INTRAVENOUS | Status: AC
  Filled 2018-12-30: qty 250

## 2018-12-30 MED ORDER — LIDOCAINE HCL 1 % IJ SOLN
INTRAMUSCULAR | Status: AC
  Filled 2018-12-30: qty 20

## 2018-12-30 MED ORDER — LIDOCAINE 2% (20 MG/ML) 5 ML SYRINGE
INTRAMUSCULAR | Status: DC | PRN
  Administered 2018-12-30: 100 mg via INTRAVENOUS

## 2018-12-30 MED ORDER — CLEVIDIPINE BUTYRATE 0.5 MG/ML IV EMUL
0.0000 mg/h | INTRAVENOUS | Status: DC
  Administered 2018-12-30: 2 mg/h via INTRAVENOUS
  Filled 2018-12-30: qty 50

## 2018-12-30 MED ORDER — SODIUM CHLORIDE 0.9 % IV SOLN
INTRAVENOUS | Status: DC | PRN
  Administered 2018-12-30: 20 ug/min via INTRAVENOUS
  Administered 2018-12-30: 12:00:00 via INTRAVENOUS

## 2018-12-30 NOTE — Anesthesia Procedure Notes (Signed)
Procedure Name: Intubation Date/Time: 12/30/2018 9:09 AM Performed by: Harden Mo, CRNA Pre-anesthesia Checklist: Patient identified, Emergency Drugs available, Suction available and Patient being monitored Patient Re-evaluated:Patient Re-evaluated prior to induction Oxygen Delivery Method: Circle System Utilized Preoxygenation: Pre-oxygenation with 100% oxygen Induction Type: IV induction and Rapid sequence Laryngoscope Size: Miller and 2 Grade View: Grade I Tube type: Oral Tube size: 7.0 mm Number of attempts: 1 Airway Equipment and Method: Stylet and Oral airway Placement Confirmation: ETT inserted through vocal cords under direct vision,  positive ETCO2 and breath sounds checked- equal and bilateral Secured at: 22 cm Tube secured with: Tape Dental Injury: Teeth and Oropharynx as per pre-operative assessment

## 2018-12-30 NOTE — Anesthesia Postprocedure Evaluation (Signed)
Anesthesia Post Note  Patient: Sheila Shields  Procedure(s) Performed: RADIOLOGY WITH ANESTHESIA (N/A )     Patient location during evaluation: PACU Anesthesia Type: General Level of consciousness: awake and alert Pain management: pain level controlled Vital Signs Assessment: post-procedure vital signs reviewed and stable Respiratory status: spontaneous breathing, nonlabored ventilation, respiratory function stable and patient connected to nasal cannula oxygen Cardiovascular status: blood pressure returned to baseline and stable Postop Assessment: no apparent nausea or vomiting Anesthetic complications: no    Last Vitals:  Vitals:   12/30/18 1237 12/30/18 1252  BP: (!) 113/40 (!) 98/52  Pulse: 70 68  Resp: 14 11  Temp: 36.6 C   SpO2: 98% 96%    Last Pain:  Vitals:   12/30/18 1237  PainSc: 0-No pain                 Renell Allum S

## 2018-12-30 NOTE — Progress Notes (Signed)
Patient ID: Sheila Shields, female   DOB: 1934/09/26, 84 y.o.   MRN: [id number] INR. POst procedure . Extubated. Denies any H/AS,N/V visual ,motoe or speech difficulties. Mild sore Throat. Oriented to place and space. No facial asymmetry. Tongue midline. Moves all 4s equally. RT Groin soft. No hematoma. Distal pulses Lt DP palpable.RT DP and PT and Lt PT dopplerable. S>Sheila Shields

## 2018-12-30 NOTE — H&P (Signed)
Chief Complaint: Patient was seen in consultation today for cerebral arteriogram with possible Right internal carotid artery aneurysm embolization at the request of Dr Liborio Nixon   Supervising Physician: Luanne Bras  Patient Status: [facility] - Out-pt  History of Present Illness: Sheila Shields is a 84 y.o. 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 Patency of the anterior communicating artery.  Discussion of intervention Dr Estanislado Pandy and now scheduled for possible embolization today  PLAN: Angiographic findings were reviewed with the patient. The history of the intracranial aneurysms was briefly reviewed. Options of continued observation versus consideration of endovascular exclusion of the aneurysm was reviewed. Briefly the procedure was discussed. The 1-2% risk of the procedure of a thromboembolic stroke with remote possibility of intra procedural rupture was also briefly discussed. Questions were answered to the patient's satisfaction. Patient expressed her desire to proceed with endovascular treatment. The endovascular treatment will be scheduled with general anesthesia as soon as possible. In the meantime, she was advised to start taking 1 baby aspirin and to continue taking her Plavix as prescribed. She was asked to call should she have any questions or concerns.  Past Medical History:  Diagnosis Date  . Alcoholism in remission ([place]) 08/27/2007  .  Anemia   . Arthritis    OA  . COPD 12/17/2006  . Diverticulosis 03/27/2007  . GERD 05/01/2007  . HYPERGLYCEMIA 08/27/2007  . HYPERLIPIDEMIA 12/17/2006  . HYPERTENSION 12/17/2006  . Mood disorder (H[facility])    hx anxiety and depression  . Pneumonia    AS A CHILD  . Rectal polyp 03/27/2007   adenoma  . TIA (transient ischemic attack)     Past Surgical History:  Procedure Laterality Date  . ABDOMINAL HYSTERECTOMY  1978   fibroma  . APPENDECTOMY  2002  . BREAST BIOPSY     negative  . BREAST EXCISIONAL BIOPSY Right    2003  . CARPAL TUNNEL RELEASE Left 2010 or 2011  . COLONOSCOPY W/ POLYPECTOMY  03/27/2007; n7/19/2012   2008: 4 mm adenoma, diverticulosis 2012: ileitis ? NSAID - likely, 2-3 mm cecal polyp LYMPHOID FOLLICLE, diverticulosis  . ESOPHAGOGASTRODUODENOSCOPY  01/26/2011   reflux esophagitis, colonscopy done also  . HAMMER TOE SURGERY  2008   left foot  . HERNIA REPAIR Right 1996?  . IR ANGIO INTRA EXTRACRAN SEL COM CAROTID INNOMINATE UNI R MOD SED  11/28/2018  . IR ANGIO VERTEBRAL SEL SUBCLAVIAN INNOMINATE UNI R MOD SED  11/28/2018  . IR US GUIDE VASC ACCESS RIGHT  11/28/2018  . ORIF ANKLE FRACTURE Right 12/22/2014   Procedure: OPEN REDUCTION INTERNAL FIXATION (ORIF) ANKLE FRACTURE;  Surgeon: Susa Day, MD;  Location: [facility] ORS;  Service: Orthopedics;  Laterality: Right;  . SHOULDER OPEN ROTATOR CUFF REPAIR Left 03/06/2013   Procedure: LEFT ROTATOR CUFF REPAIR, SUBACROMIAL DECOMPRESSION, PATCH GRAFT, MANIPULATION UNDER ANESTHESIA;  Surgeon: Johnn Hai, MD;  Location: [facility] ORS;  Service: Orthopedics;  Laterality: Left;    Allergies: Alcohol, Codeine, and Dilaudid [hydromorphone hcl]  Medications: Prior to Admission medications   Medication  Sig Start Date End Date Taking? Authorizing Provider  acetaminophen ([employer] ARTHRITIS PAIN RELIEF) 650 MG CR tablet Take 1,300 mg by mouth 2 (two) times daily.   Yes [provider]  albuterol (PROAIR HFA) 108 (90 Base) MCG/ACT inhaler  INHALE 2 PUFFS EVERY 4 HOURS AS NEEDED FOR COUGHING SPELLS Patient taking differently: Inhale 2 puffs into the lungs every 4 (four) hours as needed (coughing spells).  12/15/16  Yes Lucretia Kern, DO  aspirin EC 81 MG tablet Take 1 tablet (81 mg total) by mouth daily. 12/05/18  Yes Briscoe Deutscher, DO  atorvastatin (LIPITOR) 10 MG tablet Take 10 mg by mouth daily.   Yes [provider]  benzonatate (TESSALON) 200 MG capsule TAKE ONE CAPSULE BY MOUTH THREE TIMES A DAY AS NEEDED FOR COUGH Patient taking differently: Take 200 mg by mouth 2 (two) times a day.  11/20/18  Yes Collene Gobble, MD  clopidogrel (PLAVIX) 75 MG tablet Take 1 tablet (75 mg total) by mouth daily. 10/22/18  Yes Jaffe, Adam R, DO  Coenzyme Q10 (COQ10 PO) Take 1 capsule by mouth daily.    Yes [provider]  ferrous sulfate 325 (65 FE) MG tablet TAKE 1 TABLET BY MOUTH EVERY DAY Patient taking differently: Take 325 mg by mouth daily with breakfast.  07/26/15  Yes Dutch Quint B, FNP  fexofenadine (ALLEGRA) 180 MG tablet Take 180 mg by mouth daily.   Yes [provider]  fluticasone (FLONASE) 50 MCG/ACT nasal spray Place 2 sprays into both nostrils daily. 09/24/18  Yes Collene Gobble, MD  hydrochlorothiazide (HYDRODIURIL) 25 MG tablet TAKE ONE TABLET BY MOUTH DAILY Patient taking differently: Take 25 mg by mouth daily.  11/28/18  Yes Briscoe Deutscher, DO  hydrocortisone 2.5 % cream Apply 1 application topically 2 (two) times daily as needed. Inflammation/arthritis 12/10/18  Yes [provider]  Multiple Vitamin (MULTIVITAMIN WITH MINERALS) TABS tablet Take 1 tablet by mouth daily.   Yes [provider]  pantoprazole (PROTONIX) 40 MG tablet Take 1 tablet (40 mg total) by mouth daily. 12/26/17  Yes Zehr, Janett Billow D, PA-C  potassium chloride SA (K-DUR) 20 MEQ tablet TAKE ONE TABLET BY MOUTH DAILY Patient taking differently: Take 20 mEq by mouth daily.  11/28/18  Yes Briscoe Deutscher, DO  Propylene Glycol  (SYSTANE BALANCE OP) Place 1 drop into both eyes every 6 (six) hours as needed (dry eyes).   Yes [provider]  Vitamin D, Cholecalciferol, 1000 units CAPS Take 1,000 Units by mouth daily.    Yes [provider]  meclizine (ANTIVERT) 25 MG tablet Take 1 tablet (25 mg total) by mouth 3 (three) times daily as needed for dizziness. 08/06/17   Lucretia Kern, DO  traMADol (ULTRAM) 50 MG tablet Take 50 mg by mouth every 6 (six) hours as needed (pain).    [provider]     Family History  Problem Relation Age of Onset  . Throat cancer Mother   . Heart attack Father 65  . Idiopathic pulmonary fibrosis Brother 47  . Cancer Brother        sarcoma  . Heart disease Brother   . Colon cancer Neg Hx   . Esophageal cancer Neg Hx   . Stomach cancer Neg Hx     Social History   Socioeconomic History  . Marital status: Married    Spouse name: Not on file  . Number of children: 2  . Years of education: Not on file  .  Highest education level: Not on file  Occupational History  . Occupation: retired  Scientific laboratory technician  . Financial resource strain: Not on file  . Food insecurity    Worry: Not on file    Inability: Not on file  . Transportation needs    Medical: Not on file    Non-medical: Not on file  Tobacco Use  . Smoking status: Former Smoker    Packs/day: 2.00    Years: 30.00    Pack years: 60.00    Types: Cigarettes    Quit date: 07/10/1984    Years since quitting: 34.4  . Smokeless tobacco: Never Used  Substance and Sexual Activity  . Alcohol use: No    Alcohol/week: 0.0 standard drinks    Comment: no alcoho since 1983  . Drug use: No  . Sexual activity: Not on file  Lifestyle  . Physical activity    Days per week: Not on file    Minutes per session: Not on file  . Stress: Not on file  Relationships  . Social Herbalist on phone: Not on file    Gets together: Not on file    Attends religious service: Not on file    Active member of club or  organization: Not on file    Attends meetings of clubs or organizations: Not on file    Relationship status: Not on file  Other Topics Concern  . Not on file  Social History Narrative   Work or School: very active in [place] Situation: lives with husband      Spiritual Beliefs: Christian      Lifestyle:tries to walk; diet ok - denies any alcohol or tobacco use in many many years       Review of Systems: A 12 point ROS discussed and pertinent positives are indicated in the HPI above.  All other systems are negative.  Review of Systems  Constitutional: Negative for activity change, fatigue and fever.  HENT: Negative for tinnitus and trouble swallowing.   Eyes: Negative for visual disturbance.  Respiratory: Negative for cough and shortness of breath.   Cardiovascular: Negative for chest pain.  Gastrointestinal: Negative for abdominal pain.  Musculoskeletal: Negative for back pain and gait problem.  Neurological: Positive for numbness and headaches. Negative for dizziness, tremors, seizures, syncope, facial asymmetry, speech difficulty, weakness and light-headedness.  Psychiatric/Behavioral: Negative for behavioral problems and confusion.    Vital Signs: Ht 5\' 5"  (1.651 m)   Wt 186 lb (84.4 kg)   BMI 30.95 kg/m   Physical Exam Vitals signs reviewed.  HENT:     Head: Atraumatic.     Comments: Face symmetrical Tongue midline Eyes:     Extraocular Movements: Extraocular movements intact.  Neck:     Musculoskeletal: Normal range of motion.  Cardiovascular:     Rate and Rhythm: Normal rate and regular rhythm.     Heart sounds: Normal heart sounds.  Pulmonary:     Effort: Pulmonary effort is normal.     Breath sounds: Normal breath sounds.  Abdominal:     Palpations: Abdomen is soft.     Tenderness: There is no abdominal tenderness.  Musculoskeletal:        General: No swelling or tenderness.     Right lower leg: No edema.     Left lower leg: No edema.   Skin:    General: Skin is warm and dry.  Neurological:  Mental Status: She is alert and oriented to person, place, and time.  Psychiatric:        Mood and Affect: Mood normal.        Behavior: Behavior normal.        Thought Content: Thought content normal.        Judgment: Judgment normal.     Imaging: No results found.  Labs:  CBC: Recent Labs    04/08/18 0835 08/08/18 0836 11/14/18 0923 11/28/18 0720  WBC 6.6 6.6 7.4 7.9  HGB 14.4 14.7 14.6 14.5  HCT 42.0 43.1 42.6 42.9  PLT 257.0 240.0 272.0 280    COAGS: Recent Labs    11/28/18 0720  INR 1.0  APTT 26    BMP: Recent Labs    04/08/18 0835 08/08/18 0836 11/14/18 0923  NA 145 143 142  K 4.4 3.7 3.7  CL 108 103 101  CO2 28 31 31   GLUCOSE 103* 97 104*  BUN 22 13 15   CALCIUM 9.7 9.8 9.6  CREATININE 0.72 0.73 0.74    LIVER FUNCTION TESTS: Recent Labs    11/14/18 0923  BILITOT 0.8  AST 19  ALT 14  ALKPHOS 71  PROT 6.3  ALBUMIN 4.1    TUMOR MARKERS: No results for input(s): AFPTM, CEA, CA199, CHROMGRNA in the last 8760 hours.  Assessment and Plan:  RICA aneurysm Scheduled for arteriogram with possible embolization Risks and benefits of cerebral angiogram with intervention were discussed with the patient including, but not limited to bleeding, infection, vascular injury, contrast induced renal failure, stroke or even death.  This interventional procedure involves the use of X-rays and because of the nature of the planned procedure, it is possible that we will have prolonged use of X-ray fluoroscopy.  Potential radiation risks to you include (but are not limited to) the following: - A slightly elevated risk for cancer  several years later in life. This risk is typically less than 0.5% percent. This risk is low in comparison to the normal incidence of human cancer, which is 33% for women and 50% for men according to the [place]. - Radiation induced injury can include skin  redness, resembling a rash, tissue breakdown / ulcers and hair loss (which can be temporary or permanent).   The likelihood of either of these occurring depends on the difficulty of the procedure and whether you are sensitive to radiation due to previous procedures, disease, or genetic conditions.   IF your procedure requires a prolonged use of radiation, you will be notified and given written instructions for further action.  It is your responsibility to monitor the irradiated area for the 2 weeks following the procedure and to notify your physician if you are concerned that you have suffered a radiation induced injury.    All of the patient's questions were answered, patient is agreeable to proceed. Consent signed and in chart.  Pt is aware if intervention is performed, she will be admitted to Neuro ICU overnight and planned for discharge in am if stable She is agreeable  Thank you for this interesting consult.  I greatly enjoyed meeting Sheila Shields and look forward to participating in their care.  A copy of this report was sent to the requesting provider on this date.  Electronically Signed: Lavonia Drafts, PA-C 12/30/2018, 6:58 AM   I spent a total of  40 Minutes   in face to face in clinical consultation, greater than 50% of which was counseling/coordinating care for cerebral  arteriogram with possible R ICA aneurysm embolization

## 2018-12-30 NOTE — Progress Notes (Signed)
Patient assessed at bedside alongside Dr. Estanislado Pandy after intra-cranial intervention this AM with pipeline stenting x2 of wide neck R ICA supraclinoid aneurysm.   Patient is awake, alert, and oriented.  Feels well, conversing well. Denies headache or nausea.  Asks appropriate questions about procedure.  Neuro exam stable.  No focal deficits note: EOMs intact, PERRL, Tongue midline.  Speech intelligible.  Strength in bilateral upper and lower extremities 5/5.  Plantar and dorsiflexion 5/5.   Groin site intact, no evidence of pseudoaneurysm or hematoma.  Distal pulses intact; DP on R is palpable, by doppler on L.  Patient is to hopefully be placed in Neuro ICU overnight for observation although bed placement is pending.  Ok for clear liquids tonight.   Will assess in the AM for possible d/c home.   Brynda Greathouse, MS RD PA-C

## 2018-12-30 NOTE — Progress Notes (Signed)
ANTICOAGULATION CONSULT NOTE - Initial Consult  Pharmacy Consult for Heparin  Indication: Post Interventional Neuroradiology Procedure  Allergies  Allergen Reactions  . Alcohol Other (See Comments)    Recovering Alcoholic*  . Codeine     Patient prefers not to take this due to history of alcoholism  . Dilaudid [Hydromorphone Hcl]     Pt had hypotension after dilaudid 1mg  IV while in the OR, required temporary pressor intervention.    Patient Measurements: Height: 5\' 5"  (165.1 cm) Weight: 186 lb (84.4 kg) IBW/kg (Calculated) : 57 Heparin Dosing Weight: 75.2  Vital Signs: Temp: 97.8 F (36.6 C) (06/22 1237) BP: 113/44 (06/22 1307) Pulse Rate: 63 (06/22 1307)  Labs: Recent Labs    12/30/18 0611  HGB 14.3  HCT 43.4  PLT 276  APTT 29  LABPROT 12.9  INR 1.0  CREATININE 0.72    Estimated Creatinine Clearance: 59.2 mL/min (by C-G formula based on SCr of 0.72 mg/dL).   Medical History: Past Medical History:  Diagnosis Date  . Alcoholism in remission ([place]) 08/27/2007  . Anemia   . Arthritis    OA  . COPD 12/17/2006  . Diverticulosis 03/27/2007  . GERD 05/01/2007  . HYPERGLYCEMIA 08/27/2007  . HYPERLIPIDEMIA 12/17/2006  . HYPERTENSION 12/17/2006  . Mood disorder (HCC)    hx anxiety and depression  . Pneumonia    AS A CHILD  . Rectal polyp 03/27/2007   adenoma  . TIA (transient ischemic attack)     Assessment: 84 yo F s/p carotid arteriogram and embolizarion of R ICA. Pharmacy consulted for post procedure heparin dosing to be continued until 8am on 6/23.  Goal of Therapy:  APTT 50-60 sec Heparin level 0.1-0.25 units/ml Monitor platelets by anticoagulation protocol: Yes   Plan:  Heparin gtt at 600 units/hr 8hr Heparin level  CBC in am  Harrietta Guardian, PharmD PGY1 Pharmacy Resident 12/30/2018    2:56 PM Please check AMION for all [place] numbers

## 2018-12-30 NOTE — Transfer of Care (Signed)
Immediate Anesthesia Transfer of Care Note  Patient: Sheila Shields  Procedure(s) Performed: RADIOLOGY WITH ANESTHESIA (N/A )  Patient Location: PACU  Anesthesia Type:General  Level of Consciousness: awake, alert  and oriented  Airway & Oxygen Therapy: Patient Spontanous Breathing and Patient connected to nasal cannula oxygen  Post-op Assessment: Report given to RN, Post -op Vital signs reviewed and stable and Patient moving all extremities X 4  Post vital signs: Reviewed and stable  Last Vitals:  Vitals Value Taken Time  BP 113/40 12/30/18 1237  Temp    Pulse 72 12/30/18 1238  Resp 17 12/30/18 1238  SpO2 99 % 12/30/18 1238  Vitals shown include unvalidated device data.  Last Pain:  Vitals:   12/30/18 0625  PainSc: 0-No pain      Patients Stated Pain Goal: 0 (03/52/48 1859)  Complications: No apparent anesthesia complications

## 2018-12-30 NOTE — Anesthesia Preprocedure Evaluation (Addendum)
Anesthesia Evaluation  Patient identified by MRN, date of birth, ID band Patient awake    Reviewed: Allergy & Precautions, NPO status , Patient's Chart, lab work & pertinent test results  Airway Mallampati: II  TM Distance: >3 FB Neck ROM: Full    Dental no notable dental hx. (+) Teeth Intact, Dental Advisory Given   Pulmonary neg pulmonary ROS, former smoker,    Pulmonary exam normal breath sounds clear to auscultation       Cardiovascular hypertension, negative cardio ROS Normal cardiovascular exam Rhythm:Regular Rate:Normal     Neuro/Psych TIAnegative psych ROS   GI/Hepatic Neg liver ROS, GERD  ,  Endo/Other  negative endocrine ROS  Renal/GU negative Renal ROS  negative genitourinary   Musculoskeletal negative musculoskeletal ROS (+)   Abdominal   Peds negative pediatric ROS (+)  Hematology negative hematology ROS (+)   Anesthesia Other Findings   Reproductive/Obstetrics negative OB ROS                            Anesthesia Physical Anesthesia Plan  ASA: III  Anesthesia Plan: General   Post-op Pain Management:    Induction: Intravenous  PONV Risk Score and Plan: 3 and Ondansetron, Dexamethasone and Treatment may vary due to age or medical condition  Airway Management Planned: Oral ETT  Additional Equipment: Arterial line  Intra-op Plan:   Post-operative Plan: Extubation in OR  Informed Consent: I have reviewed the patients History and Physical, chart, labs and discussed the procedure including the risks, benefits and alternatives for the proposed anesthesia with the patient or authorized representative who has indicated his/her understanding and acceptance.     Dental advisory given  Plan Discussed with: CRNA and Surgeon  Anesthesia Plan Comments:        Anesthesia Quick Evaluation

## 2018-12-30 NOTE — Procedures (Signed)
S/P RT common carotid artreriogram followed by embolizarion of approx 24mm x 61mm wide neck Rt ICA supraclinoid aneurysm with x 2  Pipeline flex flow diverters.

## 2018-12-31 ENCOUNTER — Emergency Department (HOSPITAL_COMMUNITY)
Admission: EM | Admit: 2018-12-31 | Discharge: 2018-12-31 | Disposition: A | Discharge status: Home / Self Care | Payer: Medicare HMO | Attending: Emergency Medicine | Admitting: Emergency Medicine

## 2018-12-31 ENCOUNTER — Other Ambulatory Visit: Payer: Self-pay

## 2018-12-31 ENCOUNTER — Encounter (HOSPITAL_COMMUNITY): Payer: Self-pay | Admitting: Interventional Radiology

## 2018-12-31 DIAGNOSIS — K91840 Postprocedural hemorrhage and hematoma of a digestive system organ or structure following a digestive system procedure: Secondary | ICD-10-CM | POA: Diagnosis not present

## 2018-12-31 DIAGNOSIS — I97618 Postprocedural hemorrhage and hematoma of a circulatory system organ or structure following other circulatory system procedure: Secondary | ICD-10-CM | POA: Insufficient documentation

## 2018-12-31 DIAGNOSIS — J449 Chronic obstructive pulmonary disease, unspecified: Secondary | ICD-10-CM | POA: Insufficient documentation

## 2018-12-31 DIAGNOSIS — Z7982 Long term (current) use of aspirin: Secondary | ICD-10-CM | POA: Diagnosis not present

## 2018-12-31 DIAGNOSIS — I671 Cerebral aneurysm, nonruptured: Secondary | ICD-10-CM | POA: Diagnosis not present

## 2018-12-31 DIAGNOSIS — R58 Hemorrhage, not elsewhere classified: Secondary | ICD-10-CM

## 2018-12-31 DIAGNOSIS — Z79899 Other long term (current) drug therapy: Secondary | ICD-10-CM | POA: Insufficient documentation

## 2018-12-31 DIAGNOSIS — I10 Essential (primary) hypertension: Secondary | ICD-10-CM | POA: Insufficient documentation

## 2018-12-31 LAB — CBC WITH DIFFERENTIAL/PLATELET
Abs Immature Granulocytes: 0.05 10*3/uL (ref 0.00–0.07)
Basophils Absolute: 0 10*3/uL (ref 0.0–0.1)
Basophils Relative: 0 %
Eosinophils Absolute: 0 10*3/uL (ref 0.0–0.5)
Eosinophils Relative: 0 %
HCT: 36.6 % (ref 36.0–46.0)
Hemoglobin: 12.4 g/dL (ref 12.0–15.0)
Immature Granulocytes: 1 %
Lymphocytes Relative: 7 %
Lymphs Abs: 0.7 10*3/uL (ref 0.7–4.0)
MCH: 32 pg (ref 26.0–34.0)
MCHC: 33.9 g/dL (ref 30.0–36.0)
MCV: 94.6 fL (ref 80.0–100.0)
Monocytes Absolute: 0.6 10*3/uL (ref 0.1–1.0)
Monocytes Relative: 6 %
Neutro Abs: 9 10*3/uL — ABNORMAL HIGH (ref 1.7–7.7)
Neutrophils Relative %: 86 %
Platelets: 270 10*3/uL (ref 150–400)
RBC: 3.87 MIL/uL (ref 3.87–5.11)
RDW: 13.6 % (ref 11.5–15.5)
WBC: 10.4 10*3/uL (ref 4.0–10.5)
nRBC: 0 % (ref 0.0–0.2)

## 2018-12-31 LAB — BASIC METABOLIC PANEL
Anion gap: 10 (ref 5–15)
BUN: 13 mg/dL (ref 8–23)
CO2: 23 mmol/L (ref 22–32)
Calcium: 8.9 mg/dL (ref 8.9–10.3)
Chloride: 108 mmol/L (ref 98–111)
Creatinine, Ser: 0.74 mg/dL (ref 0.44–1.00)
GFR calc Af Amer: >60 mL/min (ref >60)
GFR calc non Af Amer: >60 mL/min (ref >60)
Glucose, Bld: 158 mg/dL — ABNORMAL HIGH (ref 70–99)
Potassium: 4.3 mmol/L (ref 3.5–5.1)
Sodium: 141 mmol/L (ref 135–145)

## 2018-12-31 LAB — POCT ACTIVATED CLOTTING TIME: Activated Clotting Time: 186 seconds

## 2018-12-31 MED ORDER — ASPIRIN 325 MG PO TABS: 325.0000 mg | ORAL_TABLET | Freq: Every day | ORAL | 3 refills | Status: AC

## 2018-12-31 NOTE — Discharge Instructions (Addendum)
Remain lying flat at home for the remainder of the night.  Limit use of the right leg.  Continue your current medications.  Return to emergency department if you have any recurrence of the bleeding or swelling at the site.

## 2018-12-31 NOTE — ED Notes (Signed)
Applied pressure for 10 min with gauze; no further oozing of blood at this time

## 2018-12-31 NOTE — Progress Notes (Signed)
Pt give discharge paperwork, teahc back method used and verbalized understanding. Pt discharged home POV with husband and daughter. A/Ox4, VS WNL. Pt had discharge paperwork, headphones, phone charger, phone, and clothes that she wore out.

## 2018-12-31 NOTE — Discharge Summary (Signed)
Patient ID: Sheila Shields MRN: [id number] DOB/AGE: 82/18/1936 82 y.o.  Admit date: 12/30/2018 Discharge date: 12/31/2018  Supervising Physician: Luanne Bras  Patient Status: [facility] - In-pt  Admission Diagnoses: Brain aneurysm  Discharge Diagnoses:  Active Problems:   Brain aneurysm   Discharged Condition: stable  Hospital Course:  Patient presented to [facility] 12/30/2018 for an image-guided cerebral arteriogram with embolization of right ICA supraclinoid aneurysm using #2 pipeline flex flow diverters. Procedure occurred without major complications and patient was transferred to neuro ICU in stable condition (VSS, right groin incision stable) for overnight observation. No major events occurred overnight.  Patient awake and alert sitting in bed with no complaints at this time. Denies headache, weakness, numbness/tingling, dizziness, vision changes, hearing changes, tinnitus, or speech difficulty. Right groin incision stable. Plan to discharge home today with 3 month follow-up MRI/MRA brain/head (our schedulers will call you to set up this imaging scan).   Consults: None  Significant Diagnostic Studies: No results found.  Treatments: Endovascular embolization of right ICA supraclinoid aneurysm using #2 pipeline flex flow diverters.  Discharge Exam: Blood pressure 126/71, pulse 79, temperature 97.7 F (36.5 C), temperature source Oral, resp. rate 16, height 5\' 5"  (1.651 m), weight 194 lb 7.1 oz (88.2 kg), SpO2 90 %. Physical Exam Vitals signs and nursing note reviewed.  Constitutional:      General: She is not in acute distress.    Appearance: Normal appearance.  Pulmonary:     Effort: Pulmonary effort is normal. No respiratory distress.  Skin:    General: Skin is warm and dry.     Comments: Right groin incision soft without active bleeding or hematoma.  Neurological:     Mental Status: She is alert.     Comments: Alert, awake, and oriented x3. Speech and comprehension  intact. PERRL bilaterally. EOMs intact bilaterally without nystagmus or subjective diplopia. No facial asymmetry. Tongue midline. Motor power symmetric proportional to effort. No pronator drift. Distal pulses palpable bilaterally with Doppler.  Psychiatric:        Mood and Affect: Mood normal.        Behavior: Behavior normal.        Thought Content: Thought content normal.        Judgment: Judgment normal.     Disposition: Discharge disposition: 01-Home or Self Care       Discharge Instructions    Call MD for:  difficulty breathing, headache or visual disturbances   Complete by: As directed    Call MD for:  extreme fatigue   Complete by: As directed    Call MD for:  hives   Complete by: As directed    Call MD for:  persistant dizziness or light-headedness   Complete by: As directed    Call MD for:  persistant nausea and vomiting   Complete by: As directed    Call MD for:  redness, tenderness, or signs of infection (pain, swelling, redness, odor or green/yellow discharge around incision site)   Complete by: As directed    Call MD for:  severe uncontrolled pain   Complete by: As directed    Call MD for:  temperature >100.4   Complete by: As directed    Diet - low sodium heart healthy   Complete by: As directed    Discharge instructions   Complete by: As directed    Continue taking Plavix 75 mg once daily. Discontinue taking Aspirin 81 mg once daily and begin taking Aspirin 325 mg once daily. No  bending, stooping, or lifting more than 10 pounds for 2 weeks. No driving self for 2 weeks. Stay hydrated by drinking plenty of water.   Increase activity slowly   Complete by: As directed    No dressing needed   Complete by: As directed      Allergies as of 12/31/2018      Reactions   Alcohol Other (See Comments)   Recovering Alcoholic*   Codeine    Patient prefers not to take this due to history of alcoholism   Dilaudid [hydromorphone Hcl]    Pt had hypotension  after dilaudid 1mg  IV while in the OR, required temporary pressor intervention.      Medication List    STOP taking these medications   aspirin EC 81 MG tablet Replaced by: aspirin 325 MG tablet     TAKE these medications   albuterol 108 (90 Base) MCG/ACT inhaler Commonly known as: ProAir HFA INHALE 2 PUFFS EVERY 4 HOURS AS NEEDED FOR COUGHING SPELLS What changed:   how much to take  how to take this  when to take this  reasons to take this  additional instructions   aspirin 325 MG tablet Take 1 tablet (325 mg total) by mouth daily. Start taking on: January 01, 2019 Replaces: aspirin EC 81 MG tablet   atorvastatin 10 MG tablet Commonly known as: LIPITOR Take 10 mg by mouth daily.   benzonatate 200 MG capsule Commonly known as: TESSALON TAKE ONE CAPSULE BY MOUTH THREE TIMES A DAY AS NEEDED FOR COUGH What changed: See the new instructions.   clopidogrel 75 MG tablet Commonly known as: PLAVIX Take 1 tablet (75 mg total) by mouth daily.   COQ10 PO Take 1 capsule by mouth daily.   [facility] Arthritis Pain Relief 650 MG CR tablet Generic drug: acetaminophen Take 1,300 mg by mouth 2 (two) times daily.   ferrous sulfate 325 (65 FE) MG tablet TAKE 1 TABLET BY MOUTH EVERY DAY What changed: when to take this   fexofenadine 180 MG tablet Commonly known as: ALLEGRA Take 180 mg by mouth daily.   fluticasone 50 MCG/ACT nasal spray Commonly known as: FLONASE Place 2 sprays into both nostrils daily.   hydrochlorothiazide 25 MG tablet Commonly known as: HYDRODIURIL TAKE ONE TABLET BY MOUTH DAILY   hydrocortisone 2.5 % cream Apply 1 application topically 2 (two) times daily as needed. Inflammation/arthritis   meclizine 25 MG tablet Commonly known as: ANTIVERT Take 1 tablet (25 mg total) by mouth 3 (three) times daily as needed for dizziness.   multivitamin with minerals Tabs tablet Take 1 tablet by mouth daily.   pantoprazole 40 MG tablet Commonly known as: PROTONIX  Take 1 tablet (40 mg total) by mouth daily.   potassium chloride SA 20 MEQ tablet Commonly known as: K-DUR TAKE ONE TABLET BY MOUTH DAILY   SYSTANE BALANCE OP Place 1 drop into both eyes every 6 (six) hours as needed (dry eyes).   traMADol 50 MG tablet Commonly known as: ULTRAM Take 50 mg by mouth every 6 (six) hours as needed (pain).   Vitamin D (Cholecalciferol) 25 MCG (1000 UT) Caps Take 1,000 Units by mouth daily.         Electronically Signed: Earley Abide, PA-C 12/31/2018, 9:59 AM   I have spent Less Than 30 Minutes discharging [name].

## 2018-12-31 NOTE — ED Provider Notes (Signed)
[facility] EMERGENCY DEPARTMENT Provider Note   CSN: [id number] Arrival date & time: 12/31/18  1626    History   Chief Complaint Chief Complaint  Patient presents with  . Post-op Problem    HPI Sheila Shields is a 84 y.o. female.     Patient is a 84 year old female who presents with bleeding from her groin.  She had neuro interventional procedure yesterday on a cerebral aneurysm.  She went home today around 69.  She says she took a nap.  When she woke up she noticed bleeding in her right groin where the puncture was.  EMS placed a pressure dressing and it seems to have slowed down at this point.  She otherwise feels okay.  She denies any dizziness.  No increased leg pain.  No numbness to her leg.     Past Medical History:  Diagnosis Date  . Alcoholism in remission ([place]) 08/27/2007  . Anemia   . Arthritis    OA  . COPD 12/17/2006  . Diverticulosis 03/27/2007  . GERD 05/01/2007  . HYPERGLYCEMIA 08/27/2007  . HYPERLIPIDEMIA 12/17/2006  . HYPERTENSION 12/17/2006  . Mood disorder (H[facility])    hx anxiety and depression  . Pneumonia    AS A CHILD  . Rectal polyp 03/27/2007   adenoma  . TIA (transient ischemic attack)     Patient Active Problem List   Diagnosis Date Noted  . Brain aneurysm 12/30/2018  . Aortic atherosclerosis ([place]) 04/11/2018  . Allergic rhinitis 11/09/2017  . Arm mass, left 04/16/2017  . Chronic cough 10/11/2015  . COPD (chronic obstructive pulmonary disease) ([place]) 11/18/2014  . Iron deficiency anemia, unspecified 01/20/2011  . Arthropathy 02/23/2010  . HYPERGLYCEMIA 08/27/2007  . Gastroesophageal reflux disease 05/01/2007  . Hyperlipemia 12/17/2006  . Essential hypertension 12/17/2006    Past Surgical History:  Procedure Laterality Date  . ABDOMINAL HYSTERECTOMY  1978   fibroma  . APPENDECTOMY  2002  . BREAST BIOPSY     negative  . BREAST EXCISIONAL BIOPSY Right    2003  . CARPAL TUNNEL RELEASE Left 2010 or 2011  . COLONOSCOPY W/  POLYPECTOMY  03/27/2007; n7/19/2012   2008: 4 mm adenoma, diverticulosis 2012: ileitis ? NSAID - likely, 2-3 mm cecal polyp LYMPHOID FOLLICLE, diverticulosis  . ESOPHAGOGASTRODUODENOSCOPY  01/26/2011   reflux esophagitis, colonscopy done also  . HAMMER TOE SURGERY  2008   left foot  . HERNIA REPAIR Right 1996?  . IR ANGIO INTRA EXTRACRAN SEL COM CAROTID INNOMINATE UNI R MOD SED  11/28/2018  . IR ANGIO VERTEBRAL SEL SUBCLAVIAN INNOMINATE UNI R MOD SED  11/28/2018  . IR US GUIDE VASC ACCESS RIGHT  11/28/2018  . ORIF ANKLE FRACTURE Right 12/22/2014   Procedure: OPEN REDUCTION INTERNAL FIXATION (ORIF) ANKLE FRACTURE;  Surgeon: Susa Day, MD;  Location: [facility];  Service: Orthopedics;  Laterality: Right;  . RADIOLOGY WITH ANESTHESIA N/A 12/30/2018   Procedure: RADIOLOGY WITH ANESTHESIA;  Surgeon: Luanne Bras, MD;  Location: [facility];  Service: Radiology;  Laterality: N/A;  . SHOULDER OPEN ROTATOR CUFF REPAIR Left 03/06/2013   Procedure: LEFT ROTATOR CUFF REPAIR, SUBACROMIAL DECOMPRESSION, PATCH GRAFT, MANIPULATION UNDER ANESTHESIA;  Surgeon: Johnn Hai, MD;  Location: [facility];  Service: Orthopedics;  Laterality: Left;     OB History   No obstetric history on file.      Home Medications    Prior to Admission medications   Medication Sig Start Date End Date Taking? Authorizing Provider  acetaminophen ([employer] ARTHRITIS  PAIN RELIEF) 650 MG CR tablet Take 1,300 mg by mouth 2 (two) times daily.    [provider]  albuterol (PROAIR HFA) 108 (90 Base) MCG/ACT inhaler INHALE 2 PUFFS EVERY 4 HOURS AS NEEDED FOR COUGHING SPELLS Patient taking differently: Inhale 2 puffs into the lungs every 4 (four) hours as needed (coughing spells).  12/15/16   Lucretia Kern, DO  aspirin 325 MG tablet Take 1 tablet (325 mg total) by mouth daily. 01/01/19   Louk, Bea Graff, PA-C  atorvastatin (LIPITOR) 10 MG tablet Take 10 mg by mouth daily.    [provider]  benzonatate (TESSALON) 200 MG capsule  TAKE ONE CAPSULE BY MOUTH THREE TIMES A DAY AS NEEDED FOR COUGH Patient taking differently: Take 200 mg by mouth 2 (two) times a day.  11/20/18   Collene Gobble, MD  clopidogrel (PLAVIX) 75 MG tablet Take 1 tablet (75 mg total) by mouth daily. 10/22/18   Pieter Partridge, DO  Coenzyme Q10 (COQ10 PO) Take 1 capsule by mouth daily.     [provider]  ferrous sulfate 325 (65 FE) MG tablet TAKE 1 TABLET BY MOUTH EVERY DAY Patient taking differently: Take 325 mg by mouth daily with breakfast.  07/26/15   Dutch Quint B, FNP  fexofenadine (ALLEGRA) 180 MG tablet Take 180 mg by mouth daily.    [provider]  fluticasone (FLONASE) 50 MCG/ACT nasal spray Place 2 sprays into both nostrils daily. 09/24/18   Collene Gobble, MD  hydrochlorothiazide (HYDRODIURIL) 25 MG tablet TAKE ONE TABLET BY MOUTH DAILY Patient taking differently: Take 25 mg by mouth daily.  11/28/18   Briscoe Deutscher, DO  hydrocortisone 2.5 % cream Apply 1 application topically 2 (two) times daily as needed. Inflammation/arthritis 12/10/18   [provider]  meclizine (ANTIVERT) 25 MG tablet Take 1 tablet (25 mg total) by mouth 3 (three) times daily as needed for dizziness. 08/06/17   Lucretia Kern, DO  Multiple Vitamin (MULTIVITAMIN WITH MINERALS) TABS tablet Take 1 tablet by mouth daily.    [provider]  pantoprazole (PROTONIX) 40 MG tablet Take 1 tablet (40 mg total) by mouth daily. 12/26/17   Zehr, Janett Billow D, PA-C  potassium chloride SA (K-DUR) 20 MEQ tablet TAKE ONE TABLET BY MOUTH DAILY Patient taking differently: Take 20 mEq by mouth daily.  11/28/18   Briscoe Deutscher, DO  Propylene Glycol (SYSTANE BALANCE OP) Place 1 drop into both eyes every 6 (six) hours as needed (dry eyes).    [provider]  traMADol (ULTRAM) 50 MG tablet Take 50 mg by mouth every 6 (six) hours as needed (pain).    [provider]  Vitamin D, Cholecalciferol, 1000 units CAPS Take 1,000 Units by mouth daily.      [provider]    Family History Family History  Problem Relation Age of Onset  . Throat cancer Mother   . Heart attack Father 27  . Idiopathic pulmonary fibrosis Brother 35  . Cancer Brother        sarcoma  . Heart disease Brother   . Colon cancer Neg Hx   . Esophageal cancer Neg Hx   . Stomach cancer Neg Hx     Social History Social History   Tobacco Use  . Smoking status: Former Smoker    Packs/day: 2.00    Years: 30.00    Pack years: 60.00    Types: Cigarettes    Quit date: 07/10/1984    Years  since quitting: 34.4  . Smokeless tobacco: Never Used  Substance Use Topics  . Alcohol use: No    Alcohol/week: 0.0 standard drinks    Comment: no alcoho since 1983  . Drug use: No     Allergies   Alcohol, Codeine, and Dilaudid [hydromorphone hcl]   Review of Systems Review of Systems  Constitutional: Negative for chills, diaphoresis, fatigue and fever.  HENT: Negative for congestion, rhinorrhea and sneezing.   Eyes: Negative.   Respiratory: Negative for cough, chest tightness and shortness of breath.   Cardiovascular: Negative for chest pain and leg swelling.  Gastrointestinal: Negative for abdominal pain, blood in stool, diarrhea, nausea and vomiting.  Genitourinary: Negative for difficulty urinating, flank pain, frequency and hematuria.  Musculoskeletal: Negative for arthralgias and back pain.  Skin: Positive for wound. Negative for rash.  Neurological: Negative for dizziness, speech difficulty, weakness, numbness and headaches.     Physical Exam Updated Vital Signs BP 135/61   Pulse 65   Temp 97.7 F (36.5 C) (Oral)   Resp 18   Ht 5\' 5"  (1.651 m)   Wt 84.4 kg   SpO2 94%   BMI 30.95 kg/m   Physical Exam Constitutional:      Appearance: She is well-developed.  HENT:     Head: Normocephalic and atraumatic.  Eyes:     Pupils: Pupils are equal, round, and reactive to light.  Neck:     Musculoskeletal: Normal range of motion and neck supple.   Cardiovascular:     Rate and Rhythm: Normal rate and regular rhythm.     Heart sounds: Normal heart sounds.  Pulmonary:     Effort: Pulmonary effort is normal. No respiratory distress.     Breath sounds: Normal breath sounds. No wheezing or rales.  Chest:     Chest wall: No tenderness.  Abdominal:     General: Bowel sounds are normal.     Palpations: Abdomen is soft.     Tenderness: There is no abdominal tenderness. There is no guarding or rebound.  Musculoskeletal: Normal range of motion.     Comments: Patient has some slight oozing from her puncture site in her right groin.  There is no hematoma.  Distal pulses are intact.  Lymphadenopathy:     Cervical: No cervical adenopathy.  Skin:    General: Skin is warm and dry.     Findings: No rash.  Neurological:     Mental Status: She is alert and oriented to person, place, and time.      ED Treatments / Results  Labs (all labs ordered are listed, but only abnormal results are displayed) Labs Reviewed - No data to display  EKG None  Radiology No results found.  Procedures Procedures (including critical care time)  Medications Ordered in ED Medications - No data to display   Initial Impression / Assessment and Plan / ED Course  I have reviewed the triage vital signs and the nursing notes.  Pertinent labs & imaging results that were available during my care of the patient were reviewed by me and considered in my medical decision making (see chart for details).        Patient is a 84 year old female who presents with bleeding at her arterial puncture site in her right groin.  The bleeding has stopped with pressure in the ED.  I spoke with Dr. Estanislado Pandy who recommended 30 minutes of pressure followed by 2 hours lying flat.  This was done and she had no recurrence  of the bleeding.  No hematoma is noted.  Distal pulses are intact.  She was discharged home with care instructions to continue lying flat for the remainder of  the night and limit use of the right leg.  Return precautions were given.  Final Clinical Impressions(s) / ED Diagnoses   Final diagnoses:  Bleeding from venipuncture site    ED Discharge Orders    None       Malvin Johns, MD 12/31/18 2003

## 2018-12-31 NOTE — Progress Notes (Signed)
[facility] for Heparin  Indication: Post Neuro-IR Procedure  Allergies  Allergen Reactions  . Alcohol Other (See Comments)    Recovering Alcoholic*  . Codeine     Patient prefers not to take this due to history of alcoholism  . Dilaudid [Hydromorphone Hcl]     Pt had hypotension after dilaudid 1mg  IV while in the OR, required temporary pressor intervention.    Patient Measurements: Height: 5\' 5"  (165.1 cm) Weight: 194 lb 7.1 oz (88.2 kg) IBW/kg (Calculated) : 57 Heparin Dosing Weight: 75.2  Vital Signs: Temp: 97.8 F (36.6 C) (06/22 2000) Temp Source: Axillary (06/22 2000) BP: 110/48 (06/22 2300) Pulse Rate: 71 (06/22 2300)  Labs: Recent Labs    12/30/18 0611 12/30/18 2319  HGB 14.3  --   HCT 43.4  --   PLT 276  --   APTT 29  --   LABPROT 12.9  --   INR 1.0  --   HEPARINUNFRC  --  0.12*  CREATININE 0.72  --     Estimated Creatinine Clearance: 60.5 mL/min (by C-G formula based on SCr of 0.72 mg/dL).   Medical History: Past Medical History:  Diagnosis Date  . Alcoholism in remission ([place]) 08/27/2007  . Anemia   . Arthritis    OA  . COPD 12/17/2006  . Diverticulosis 03/27/2007  . GERD 05/01/2007  . HYPERGLYCEMIA 08/27/2007  . HYPERLIPIDEMIA 12/17/2006  . HYPERTENSION 12/17/2006  . Mood disorder ([facility])    hx anxiety and depression  . Pneumonia    AS A CHILD  . Rectal polyp 03/27/2007   adenoma  . TIA (transient ischemic attack)     Assessment: 84 yo F s/p carotid arteriogram and embolizarion of R ICA. Pharmacy consulted for post procedure heparin dosing to be continued until 8am on 6/23.  6/23 AM update:   Heparin level within goal range  Goal of Therapy:  APTT 50-60 sec Heparin level 0.1-0.25 units/ml Monitor platelets by anticoagulation protocol: Yes   Plan:  Cont heparin at 600 units/hr Heparin off in AM per order admin instructions  Narda Bonds, PharmD, [place] Pharmacist Phone: [phone]

## 2018-12-31 NOTE — Plan of Care (Signed)

## 2018-12-31 NOTE — ED Triage Notes (Signed)
Pt has a brain aneurysm and and was repaid yesterday; patient had a pipeline embolization device that was accessed through the right femoral artery; pt was discharged this morning and noticed that around 1530 pm today the site began to bleed; upon ems arrival , there was about 110ml of blood on the floor ; no more further bleeding at this time; pt denies any symptoms ; pt on plavix and asa

## 2018-12-31 NOTE — ED Notes (Signed)
Applied pressure x 30 min ;still no oozing of blood noted ; patient will be kept in supine position x 2 hours per MD

## 2018-12-31 NOTE — Discharge Instructions (Signed)
Endovascular Therapy for Cerebral Aneurysm, Care After This sheet gives you information about how to care for yourself after your procedure. Your health care provider may also give you more specific instructions. If you have problems or questions, contact your health care provider. What can I expect after the procedure? After the procedure, it is common to have:  Pain, tenderness, and swelling around your incision.  Headaches. Follow these instructions at home: Medicines  Take over-the-counter and prescription medicines only as told by your health care provider.  If you were prescribed medicines to prevent blood clots (antiplatelet medicines), talk with your health care provider about the risks. These medicines can increase bleeding, so you may need to avoid certain activities. Incision care   Follow instructions from your health care provider about how to take care of your incision. Make sure you: ? Wash your hands with soap and water before you change your bandage (dressing). If soap and water are not available, use hand sanitizer. ? Change your dressing as told by your health care provider. ? Leave stitches (sutures), skin glue, or adhesive strips in place. These skin closures may need to stay in place for 2 weeks or longer. If adhesive strip edges start to loosen and curl up, you may trim the loose edges. Do not remove adhesive strips completely unless your health care provider tells you to do that.  Check your incision area every day for signs of infection. Check for: ? Redness, swelling, or pain. ? Fluid or blood. ? Warmth. ? Pus or a bad smell. Activity  Ask your health care provider what activities are safe for you during recovery. Most people can return to normal activities 2-6 weeks after the procedure.  Do not drive until your health care provider approves.  Do not drive or use heavy machinery while taking prescription pain medicine.  Do not lift anything that is heavier  than 10 lb (4.5 kg), or the limit that you are told, until your health care provider says that it is safe.  Exercise regularly, as directed by your health care provider.  Attend rehabilitation therapy as told by your health care provider. This may include: ? Physical and occupational therapy. ? Speech-language therapy. ? Brain exercises. ? Balance exercises. ? Individual or group therapy. ? Education about your condition and treatment. Eating and drinking   Drink enough fluid to keep your urine pale yellow.  Eat a healthy diet. This includes plenty of fruits and vegetables, whole grains, low-fat dairy products, and lean protein. General instructions  Do not use any products that contain nicotine or tobacco, such as cigarettes and e-cigarettes. If you need help quitting, ask your health care provider.  Do not take baths, swim, or use a hot tub until your health care provider approves. Ask your health care provider if you may take showers. You may only be allowed to take sponge baths for bathing.  Manage your stress. If you need help with this, talk with your health care provider.  Wear compression stockings as told by your health care provider. These stockings help to prevent blood clots and reduce swelling in your legs.  Keep all follow-up visits as told by your health care provider. This is important. Contact a health care provider if:  You have redness, swelling, or pain around your incision.  You have fluid or blood coming from your incision.  Your incision feels warm to the touch.  You have pus or a bad smell coming from your incision.  You  have a fever. Get help right away if:   You have: ? Stiffness in your neck. ? Pain, numbness, weakness, or swelling in your legs. ? Severe chest pain. ? Difficulty breathing. ? Confusion.  You have any symptoms of stroke. "BE FAST" is an easy way to remember the main warning signs of stroke: ? B - Balance. Signs are dizziness,  sudden trouble walking, or loss of balance. ? E - Eyes. Signs are trouble seeing or a sudden change in vision. ? F - Face. Signs are sudden weakness or numbness of the face, or the face or eyelid drooping on one side. ? A - Arms. Signs are weakness or numbness in an arm. This happens suddenly and usually on one side of the body. ? S - Speech. Signs are sudden trouble speaking, slurred speech, or trouble understanding what people say. ? T - Time. Time to call emergency services. Write down what time symptoms started.  You have other signs of stroke, such as: ? A sudden, severe headache that does not get better with medicine. ? Sudden nausea or vomiting. ? A seizure. These symptoms may represent a serious problem that is an emergency. Do not wait to see if the symptoms will go away. Get medical help right away. Call your local emergency services (911 in the [place] Do not drive yourself to the hospital. Summary  After this procedure, it is common to have some pain and swelling around your incision.  Follow instructions from your health care provider about how to take care of your incision. Check for signs of infection every day.  Most people can return to normal activities 2-6 weeks after the procedure. Ask your health care provider what activities are safe for you during recovery.  Do not drive until your health care provider approves. Do not drive while taking prescription pain medicine.  Do not use any products that contain nicotine or tobacco, such as cigarettes and e-cigarettes. If you need help quitting, ask your health care provider. This information is not intended to replace advice given to you by your health care provider. Make sure you discuss any questions you have with your health care provider. Document Released: 10/02/2016 Document Revised: 10/02/2016 Document Reviewed: 10/02/2016 Elsevier Interactive Patient Education  2019 [employer].

## 2018-12-31 NOTE — Progress Notes (Signed)
74. Dr. Estanislado Pandy asked me to go and evaluate Rt groin for post procedure bleeding.  Site soft, clean and dry. Distal pulses doppler bilateral.  Patient instructed to go home after 2 hr observation and lie flat until the am.  Site reviewed with ED RN.

## 2019-01-01 ENCOUNTER — Encounter (HOSPITAL_COMMUNITY): Payer: Self-pay | Admitting: Interventional Radiology

## 2019-01-20 ENCOUNTER — Other Ambulatory Visit: Payer: Self-pay

## 2019-01-20 ENCOUNTER — Other Ambulatory Visit: Payer: Medicare HMO

## 2019-01-20 ENCOUNTER — Other Ambulatory Visit (INDEPENDENT_AMBULATORY_CARE_PROVIDER_SITE_OTHER): Payer: Medicare HMO

## 2019-01-20 ENCOUNTER — Other Ambulatory Visit: Payer: Self-pay | Admitting: Family Medicine

## 2019-01-20 ENCOUNTER — Ambulatory Visit
Admission: RE | Admit: 2019-01-20 | Discharge: 2019-01-20 | Disposition: A | Discharge status: Home / Self Care | Payer: Medicare HMO | Source: Ambulatory Visit | Attending: Family Medicine | Admitting: Family Medicine

## 2019-01-20 DIAGNOSIS — G459 Transient cerebral ischemic attack, unspecified: Secondary | ICD-10-CM

## 2019-01-20 DIAGNOSIS — E785 Hyperlipidemia, unspecified: Secondary | ICD-10-CM

## 2019-01-20 DIAGNOSIS — Z1231 Encounter for screening mammogram for malignant neoplasm of breast: Secondary | ICD-10-CM

## 2019-01-20 LAB — LIPID PANEL
Cholesterol: 178 mg/dL (ref 0–200)
HDL: 47.6 mg/dL (ref >39.00)
NonHDL: 130.72
Total CHOL/HDL Ratio: 4
Triglycerides: 236 mg/dL — ABNORMAL HIGH (ref 0.0–149.0)
VLDL: 47.2 mg/dL — ABNORMAL HIGH (ref 0.0–40.0)

## 2019-01-20 LAB — LDL CHOLESTEROL, DIRECT: Direct LDL: 94 mg/dL

## 2019-01-21 ENCOUNTER — Telehealth: Payer: Self-pay

## 2019-01-21 DIAGNOSIS — G459 Transient cerebral ischemic attack, unspecified: Secondary | ICD-10-CM

## 2019-01-21 DIAGNOSIS — E785 Hyperlipidemia, unspecified: Secondary | ICD-10-CM

## 2019-01-21 NOTE — Telephone Encounter (Signed)
-----   Message from Pieter Partridge, DO sent at 01/21/2019 10:51 AM EDT ----- The LDL (bad cholesterol) is still elevated.  I would like to increase atorvastatin to 40mg  daily and repeat lipid panel with direct LDL in 4 months.

## 2019-01-21 NOTE — Telephone Encounter (Signed)
Advised patient of lab results and recommendations.  She says in Lipitor is causing her to ache.  She has had this issue in the past with crestor, but did not have this side effect with simvastatin.  Refill not sent.  Is it still appropriate given patients side effects? Also Dr Estanislado Pandy bannded her aneurysm was unable to coil it.  Future labs ordered.

## 2019-01-22 MED ORDER — SIMVASTATIN 20 MG PO TABS: 20.0000 mg | ORAL_TABLET | Freq: Every day | ORAL | 3 refills | Status: DC

## 2019-01-22 NOTE — Telephone Encounter (Signed)
Spoke with patient.  Advised her simvastatin would be sent to pharmacy.  She is agreeable.

## 2019-01-22 NOTE — Telephone Encounter (Signed)
She can go back on simvastatin 20mg  daily.  Recheck lipid panel in 3 months.

## 2019-01-29 ENCOUNTER — Other Ambulatory Visit: Payer: Self-pay | Admitting: Emergency Medicine

## 2019-02-12 ENCOUNTER — Other Ambulatory Visit: Payer: Self-pay | Admitting: Neurology

## 2019-02-15 ENCOUNTER — Other Ambulatory Visit: Payer: Self-pay | Admitting: Neurology

## 2019-02-19 NOTE — Progress Notes (Signed)
NEUROLOGY FOLLOW UP OFFICE NOTE  Sheila Shields [id number]  HISTORY OF PRESENT ILLNESS: Sheila Shields is an 84 year old Caucasian woman with COPD, hypertension, hyperlipidemia, arthritis, alcoholism in remission and ocular migraines who follows up TIA. History supplemented by referring provider note.   UPDATE: On Plavix 75mg  and ASA 325mg  daily.  She underwent TIA workup: MRI of brain from 11/05/18 personally reviewed and demonstrated age-related atrophy and mild chronic small vessel ischemic changes but no acute or subacute infarct. 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 Direct LDL from 01/21/19 was 94.  She was advised to increase atorvastatin from 20mg  to 40mg  daily, however it caused myalgias.  She was changed back to to simvastatin   She saw Dr. Estanislado Shields of IR for the  Right ICA intracranial aneurysm and underwent endovascular embolization in June.    Since the procedure, she has had 3 ocular migraines over the next couple of weeks and none since then.  No recent headaches.  She notes a left sided neck pain down to the shoulder and up to back of head.  It doesn't last long and no radicular pain or numbness in the arm/hand.    HISTORY: She has longstanding history of ocular migraines presenting as wavy lines in her vision. She had increased frequency of ocular migraines for a few days about a month ago. She was evaluated by ophthalmology with unremarkable exam. , she developed a dull nonthrobbing headache across the forehead and down the left side of her face, ear and neck. On Sunday night, she was relaxing in her recliner when she developed left sided facial  numbness lasting less than 5 minutes. 45 minutes later, her left hand became numb and contracted into a fist. She couldn't open her fist. This lasted less than 5 minutes. There was no associated visual disturbance, facial droop, slurred speech or unilateral pain or weakness of the arm and hand. CT of head without contrast performed yesterday was personally reviewed and demonstrated mild chronic small vessel ischemic changes and atrophy but no acute intracranial abnormality.Last night, her left arm and hand became numb for a couple of minutes. No recurrent episodes today but continues to have mild headache and left sided neck pain.  She has been on ASA 81mg  daily for several years. She takes simvastatin 20mg  daily. Lipid panel from 08/08/18 demonstrated cholesterol 173, TG 156, HDL 43.60 and LDL 98.  Other than ocular migraines, she denies prior history of classic migraines or migraines with headache.  PAST MEDICAL HISTORY: Past Medical History:  Diagnosis Date  . Alcoholism in remission ([place]) 08/27/2007  . Anemia   . Arthritis    OA  . COPD 12/17/2006  . Diverticulosis 03/27/2007  . GERD 05/01/2007  . HYPERGLYCEMIA 08/27/2007  . HYPERLIPIDEMIA 12/17/2006  . HYPERTENSION 12/17/2006  . Mood disorder ([facility])    hx anxiety and depression  . Pneumonia    AS A CHILD  . Rectal polyp 03/27/2007   adenoma  . TIA (transient ischemic attack)     MEDICATIONS: Current Outpatient Medications on File Prior to Visit  Medication Sig Dispense Refill  . acetaminophen ([facility] ARTHRITIS PAIN RELIEF) 650 MG CR tablet Take 1,300 mg by mouth 2 (two) times  daily.    . albuterol (PROAIR HFA) 108 (90 Base) MCG/ACT inhaler INHALE 2 PUFFS EVERY 4 HOURS AS NEEDED FOR COUGHING SPELLS (Patient taking differently: Inhale 2 puffs into the lungs every 4 (four) hours as needed (coughing spells). ) 8.5 Inhaler 2  . aspirin 325 MG tablet Take 1 tablet (325 mg total) by mouth daily. 30 tablet 3  . benzonatate (TESSALON)  200 MG capsule TAKE ONE CAPSULE BY MOUTH THREE TIMES A DAY AS NEEDED FOR COUGH 45 capsule 1  . clopidogrel (PLAVIX) 75 MG tablet TAKE ONE TABLET BY MOUTH DAILY 90 tablet 2  . Coenzyme Q10 (COQ10 PO) Take 1 capsule by mouth daily.     . ferrous sulfate 325 (65 FE) MG tablet TAKE 1 TABLET BY MOUTH EVERY DAY (Patient taking differently: Take 325 mg by mouth daily with breakfast. ) 90 tablet 3  . fexofenadine (ALLEGRA) 180 MG tablet Take 180 mg by mouth daily.    . fluticasone (FLONASE) 50 MCG/ACT nasal spray Place 2 sprays into both nostrils daily. 16 g 2  . hydrochlorothiazide (HYDRODIURIL) 25 MG tablet TAKE ONE TABLET BY MOUTH DAILY (Patient taking differently: Take 25 mg by mouth daily. ) 90 tablet 0  . hydrocortisone 2.5 % cream Apply 1 application topically 2 (two) times daily as needed. Inflammation/arthritis    . meclizine (ANTIVERT) 25 MG tablet Take 1 tablet (25 mg total) by mouth 3 (three) times daily as needed for dizziness. 30 tablet 0  . Multiple Vitamin (MULTIVITAMIN WITH MINERALS) TABS tablet Take 1 tablet by mouth daily.    . pantoprazole (PROTONIX) 40 MG tablet Take 1 tablet (40 mg total) by mouth daily. 90 tablet 3  . potassium chloride SA (K-DUR) 20 MEQ tablet TAKE ONE TABLET BY MOUTH DAILY (Patient taking differently: Take 20 mEq by mouth daily. ) 90 tablet 0  . Propylene Glycol (SYSTANE BALANCE OP) Place 1 drop into both eyes every 6 (six) hours as needed (dry eyes).    . simvastatin (ZOCOR) 20 MG tablet Take 1 tablet (20 mg total) by mouth daily. 90 tablet 3  . traMADol (ULTRAM) 50 MG tablet Take 50 mg by mouth every 6 (six) hours as needed (pain).    . Vitamin D, Cholecalciferol, 1000 units CAPS Take 1,000 Units by mouth daily.      No current facility-administered medications on file prior to visit.     ALLERGIES: Allergies  Allergen Reactions  . Alcohol Other (See Comments)    Recovering Alcoholic*  . Codeine     Patient prefers not to take this due to history of  alcoholism  . Dilaudid [Hydromorphone Hcl]     Pt had hypotension after dilaudid 1mg  IV while in the OR, required temporary pressor intervention.    FAMILY HISTORY: Family History  Problem Relation Age of Onset  . Throat cancer Mother   . Heart attack Father 45  . Idiopathic pulmonary fibrosis Brother 61  . Cancer Brother        sarcoma  . Heart disease Brother   . Colon cancer Neg Hx   . Esophageal cancer Neg Hx   . Stomach cancer Neg Hx     SOCIAL HISTORY: Social History   Socioeconomic History  . Marital status: Married    Spouse name: Not on file  . Number of children: 2  . Years of education: Not on file  . Highest education level: Not on file  Occupational History  . Occupation: retired  Scientific laboratory technician  .  Financial resource strain: Not on file  . Food insecurity    Worry: Not on file    Inability: Not on file  . Transportation needs    Medical: Not on file    Non-medical: Not on file  Tobacco Use  . Smoking status: Former Smoker    Packs/day: 2.00    Years: 30.00    Pack years: 60.00    Types: Cigarettes    Quit date: 07/10/1984    Years since quitting: 34.6  . Smokeless tobacco: Never Used  Substance and Sexual Activity  . Alcohol use: No    Alcohol/week: 0.0 standard drinks    Comment: no alcoho since 1983  . Drug use: No  . Sexual activity: Not on file  Lifestyle  . Physical activity    Days per week: Not on file    Minutes per session: Not on file  . Stress: Not on file  Relationships  . Social Herbalist on phone: Not on file    Gets together: Not on file    Attends religious service: Not on file    Active member of club or organization: Not on file    Attends meetings of clubs or organizations: Not on file    Relationship status: Not on file  . Intimate partner violence    Fear of current or ex partner: Not on file    Emotionally abused: Not on file    Physically abused: Not on file    Forced sexual activity: Not on file  Other  Topics Concern  . Not on file  Social History Narrative   Work or School: very active in [place] Situation: lives with husband      Spiritual Beliefs: Christian      Lifestyle:tries to walk; diet ok - denies any alcohol or tobacco use in many many years       REVIEW OF SYSTEMS: Constitutional: No fevers, chills, or sweats, no generalized fatigue, change in appetite Eyes: No visual changes, double vision, eye pain Ear, nose and throat: No hearing loss, ear pain, nasal congestion, sore throat Cardiovascular: No chest pain, palpitations Respiratory:  No shortness of breath at rest or with exertion, wheezes GastrointestinaI: No nausea, vomiting, diarrhea, abdominal pain, fecal incontinence Genitourinary:  No dysuria, urinary retention or frequency Musculoskeletal:  No neck pain, back pain Integumentary: No rash, pruritus, skin lesions Neurological: as above Psychiatric: No depression, insomnia, anxiety Endocrine: No palpitations, fatigue, diaphoresis, mood swings, change in appetite, change in weight, increased thirst Hematologic/Lymphatic:  No purpura, petechiae. Allergic/Immunologic: no itchy/runny eyes, nasal congestion, recent allergic reactions, rashes  PHYSICAL EXAM: Blood pressure (!) 157/80, pulse 82, temperature 98.4 F (36.9 C), temperature source Oral, height 5' 5.5" (1.664 m), weight 193 lb (87.5 kg), SpO2 95 %. General: No acute distress.  Patient appears well-groomed.   Head:  Normocephalic/atraumatic Eyes:  Fundi examined but not visualized Neck: supple, no paraspinal tenderness, full range of motion Heart:  Regular rate and rhythm Lungs:  Clear to auscultation bilaterally Back: No paraspinal tenderness Neurological Exam: alert and oriented to person, place, and time. Attention span and concentration intact, recent and remote memory intact, fund of knowledge intact.  Speech fluent and not dysarthric, language intact.  CN II-XII intact. Bulk and tone  normal, muscle strength 5/5 throughout.  Numbness in right first toe.  Otherwise, sensation to light touch, temperature and vibration intact.  Deep tendon reflexes 2+ throughout, toes downgoing.  Finger to nose and heel to shin testing intact.  Gait normal, Romberg negative.  IMPRESSION: 1.  Concern for possible stroke or TIA.  Complicated migraine possible.  However given her stroke risk factors (age, hyperlipidemia, hypertension) and no prior history of migraines with similar symptoms, I would more strongly consider CVA vs TIA. 2.  Cerebral aneurysm, s/p embolization 3.   Ocular migraine 4.  Hyperlipidemia 5.  Hypertension  PLAN: 1. ASA and Plavix 2. Increase simvastatin to 40mg  daily.  Check lipid panel in 3 months. 3.  Optimize blood pressure control.  Follow up with PCP. 4.  Follow up in 6 months.  25 minutes spent face to face with patient, over 50% spent discussing management.  Metta Clines, DO  CC: Briscoe Deutscher, DO

## 2019-02-21 ENCOUNTER — Ambulatory Visit: Payer: Medicare HMO | Admitting: Neurology

## 2019-02-21 ENCOUNTER — Encounter: Payer: Self-pay | Admitting: Neurology

## 2019-02-21 ENCOUNTER — Other Ambulatory Visit: Payer: Self-pay

## 2019-02-21 VITALS — BP 157/80 | HR 82 | Temp 98.4°F | Ht 65.5 in | Wt 193.0 lb

## 2019-02-21 DIAGNOSIS — R6889 Other general symptoms and signs: Secondary | ICD-10-CM

## 2019-02-21 DIAGNOSIS — G459 Transient cerebral ischemic attack, unspecified: Secondary | ICD-10-CM

## 2019-02-21 DIAGNOSIS — I671 Cerebral aneurysm, nonruptured: Secondary | ICD-10-CM | POA: Diagnosis not present

## 2019-02-21 DIAGNOSIS — G43109 Migraine with aura, not intractable, without status migrainosus: Secondary | ICD-10-CM

## 2019-02-21 MED ORDER — SIMVASTATIN 40 MG PO TABS: 40.0000 mg | ORAL_TABLET | Freq: Every day | ORAL | 5 refills | Status: DC

## 2019-02-21 NOTE — Patient Instructions (Addendum)
1  Continue aspirin and plavix 2.  Increase simvastatin to 40mg  daily.  Repeat lipid panel in 3 months 3.  Follow up in 6 months.  Your provider has requested that you have labwork completed in 3 months.  You do not need an appointment. You will need to check in with our office first, then proceed to [facility] ([id number]) on the second floor of this building. You do not need to check in. If you are not called within 15 minutes please check with the front desk.  Keep in mind, the lab is closed daily from 12:15 pm - 1:30 pm for lunch

## 2019-03-01 ENCOUNTER — Other Ambulatory Visit: Payer: Self-pay | Admitting: Gastroenterology

## 2019-03-03 ENCOUNTER — Encounter: Payer: Self-pay | Admitting: Family Medicine

## 2019-03-04 ENCOUNTER — Other Ambulatory Visit: Payer: Self-pay

## 2019-03-04 ENCOUNTER — Ambulatory Visit: Payer: Medicare HMO | Admitting: Family Medicine

## 2019-03-04 ENCOUNTER — Other Ambulatory Visit: Payer: Self-pay | Admitting: Family Medicine

## 2019-03-04 ENCOUNTER — Ambulatory Visit (INDEPENDENT_AMBULATORY_CARE_PROVIDER_SITE_OTHER): Payer: Medicare HMO

## 2019-03-04 ENCOUNTER — Other Ambulatory Visit: Payer: Medicare HMO

## 2019-03-04 DIAGNOSIS — M79642 Pain in left hand: Secondary | ICD-10-CM

## 2019-03-04 NOTE — Progress Notes (Signed)
Virtual Visit via Video Note The purpose of this virtual visit is to provide medical care while limiting exposure to the novel coronavirus.    Consent was obtained for video visit:  Yes Answered questions that patient had about telehealth interaction:  Yes I discussed the limitations, risks, security and privacy concerns of performing an evaluation and management service by telemedicine. I also discussed with the patient that there may be a patient responsible charge related to this service. The patient expressed understanding and agreed to proceed.  Pt location: Home Physician Location: office Name of referring provider:  Briscoe Deutscher, DO I connected with Sheila Shields at patients initiation/request on 03/05/2019 at  3:30 PM EDT by video enabled telemedicine application and verified that I am speaking with the correct person using two identifiers. Pt MRN:  [id number] Pt DOB:  [birth year] Video Participants:  Sheila Shields;  Her husband   History of Present Illness:  Sheila Shields is an 84 year old woman with COPD, hypertension, hyperlipidemia, arthritis, alcoholism in remission and ocular migraines who presents for transient amnesia.  On 02/26/19, she and her husband were having lunch with friends when she suddenly realized she was referring to her current husband by her first husband's name.  It was a strange feeling of being taken back in time.  No headache, loss of awareness, phantosmia, gastric uprising, slurred speech, facial droop, dizziness or unilateral numbness or weakness.  This lasted up to 5 minutes at the most.  She reports a similar event 20 years ago at her doctor's office who diagnosed her with transient global amnesia.  However, that episode lasted about 45 minutes.  She just underwent a TIA workup: MRI of brain from 11/05/18 personally reviewed and demonstrated age-related atrophy and mild chronic small vessel ischemic changes but no acute or subacute infarct. 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 Direct LDL from 01/21/19 was 94.  She was advised to increase atorvastatin from 20mg  to 40mg  daily, however it caused myalgias.  She was changed back to to simvastatin   She saw Dr. Estanislado Pandy of IR for the  Right ICA intracranial aneurysm and underwent endovascular embolization in June.    Past Medical History: Past Medical History:  Diagnosis Date  . Alcoholism in remission ([place]) 08/27/2007  . Anemia   . Arthritis    OA  . COPD 12/17/2006  . Diverticulosis 03/27/2007  . GERD 05/01/2007  . HYPERGLYCEMIA 08/27/2007  . HYPERLIPIDEMIA 12/17/2006  . HYPERTENSION 12/17/2006  . Mood disorder ([facility])    hx anxiety and depression  . Pneumonia    AS A CHILD  . Rectal polyp 03/27/2007   adenoma  . TIA (transient ischemic attack)     Medications: Outpatient Encounter Medications as of 03/05/2019  Medication Sig  . acetaminophen ([employer] ARTHRITIS PAIN RELIEF) 650 MG CR tablet Take 1,300 mg by mouth 2 (two) times daily.  Marland Kitchen albuterol (PROAIR HFA) 108 (90 Base) MCG/ACT inhaler INHALE 2 PUFFS EVERY 4 HOURS AS NEEDED FOR COUGHING SPELLS (Patient taking differently: Inhale 2 puffs into the lungs every 4 (four) hours as needed (coughing spells). )  . aspirin 325 MG tablet Take 1 tablet (325 mg total) by mouth daily.  Marland Kitchen  benzonatate (TESSALON) 200 MG capsule TAKE ONE CAPSULE BY MOUTH THREE TIMES A DAY AS NEEDED FOR COUGH  . clopidogrel (PLAVIX) 75 MG tablet TAKE ONE TABLET BY MOUTH DAILY  . Coenzyme Q10 (COQ10 PO) Take 1 capsule by mouth daily.   . ferrous sulfate 325 (65 FE) MG tablet TAKE 1 TABLET BY MOUTH EVERY DAY (Patient taking differently: Take 325 mg by mouth daily  with breakfast. )  . fexofenadine (ALLEGRA) 180 MG tablet Take 180 mg by mouth daily.  . fluticasone (FLONASE) 50 MCG/ACT nasal spray Place 2 sprays into both nostrils daily.  . hydrochlorothiazide (HYDRODIURIL) 25 MG tablet TAKE ONE TABLET BY MOUTH DAILY (Patient taking differently: Take 25 mg by mouth daily. )  . hydrocortisone 2.5 % cream Apply 1 application topically 2 (two) times daily as needed. Inflammation/arthritis  . meclizine (ANTIVERT) 25 MG tablet Take 1 tablet (25 mg total) by mouth 3 (three) times daily as needed for dizziness.  . Multiple Vitamin (MULTIVITAMIN WITH MINERALS) TABS tablet Take 1 tablet by mouth daily.  . pantoprazole (PROTONIX) 40 MG tablet TAKE ONE TABLET BY MOUTH DAILY  . potassium chloride SA (K-DUR) 20 MEQ tablet TAKE ONE TABLET BY MOUTH DAILY (Patient taking differently: Take 20 mEq by mouth daily. )  . Propylene Glycol (SYSTANE BALANCE OP) Place 1 drop into both eyes every 6 (six) hours as needed (dry eyes).  . simvastatin (ZOCOR) 40 MG tablet Take 1 tablet (40 mg total) by mouth daily.  . traMADol (ULTRAM) 50 MG tablet Take 50 mg by mouth every 6 (six) hours as needed (pain).  . Vitamin D, Cholecalciferol, 1000 units CAPS Take 1,000 Units by mouth daily.    No facility-administered encounter medications on file as of 03/05/2019.     Allergies: Allergies  Allergen Reactions  . Alcohol Other (See Comments)    Recovering Alcoholic*  . Codeine     Patient prefers not to take this due to history of alcoholism  . Dilaudid [Hydromorphone Hcl]     Pt had hypotension after dilaudid 1mg  IV while in the OR, required temporary pressor intervention.    Family History: Family History  Problem Relation Age of Onset  . Throat cancer Mother   . Heart attack Father 20  . Idiopathic pulmonary fibrosis Brother 77  . Cancer Brother        sarcoma  . Heart disease Brother   . Colon cancer Neg Hx   . Esophageal cancer Neg Hx   . Stomach cancer Neg Hx     Social  History: Social History   Socioeconomic History  . Marital status: Married    Spouse name: Not on file  . Number of children: 2  . Years of education: Not on file  . Highest education level: Not on file  Occupational History  . Occupation: retired  Scientific laboratory technician  . Financial resource strain: Not on file  . Food insecurity    Worry: Not on file    Inability: Not on file  . Transportation needs    Medical: Not on file    Non-medical: Not on file  Tobacco Use  . Smoking status: Former Smoker    Packs/day: 2.00    Years: 30.00    Pack years: 60.00    Types: Cigarettes    Quit date: 07/10/1984    Years since quitting: 34.6  . Smokeless tobacco: Never Used  Substance and Sexual Activity  . Alcohol use: No    Alcohol/week: 0.0 standard drinks  Comment: no alcoho since 1983  . Drug use: No  . Sexual activity: Not on file  Lifestyle  . Physical activity    Days per week: Not on file    Minutes per session: Not on file  . Stress: Not on file  Relationships  . Social Herbalist on phone: Not on file    Gets together: Not on file    Attends religious service: Not on file    Active member of club or organization: Not on file    Attends meetings of clubs or organizations: Not on file    Relationship status: Not on file  . Intimate partner violence    Fear of current or ex partner: Not on file    Emotionally abused: Not on file    Physically abused: Not on file    Forced sexual activity: Not on file  Other Topics Concern  . Not on file  Social History Narrative   Work or School: very active in [place] Situation: lives with husband      Spiritual Beliefs: Christian      Lifestyle:tries to walk; diet ok - denies any alcohol or tobacco use in many many years       Observations/Objective:   Blood pressure (!) 144/78, height 5\' 5"  (1.651 m), weight 189 lb (85.7 kg).  No acute distress.  Alert and oriented.  Speech fluent and not dysarthric.   Language intact.  Eyes orthophoric on primary gaze.  Face symmetric.  Assessment and Plan:   1.  Possible transient global amnesia.    1.  We will need to repeat MRI of brain.  She is supposed to have repeat imaging by Dr. Estanislado Pandy over the next month or so.  I will contact his office to see if they would like to repeat their imaging at the same time.   2.  We will schedule for EEG 3.  Further recommendations pending results.  Follow Up Instructions:    -I discussed the assessment and treatment plan with the patient. The patient was provided an opportunity to ask questions and all were answered. The patient agreed with the plan and demonstrated an understanding of the instructions.   The patient was advised to call back or seek an in-person evaluation if the symptoms worsen or if the condition fails to improve as anticipated.    Total Time spent in visit with the patient was:  15 minutes  Dudley Major, DO

## 2019-03-04 NOTE — Progress Notes (Deleted)
Sheila Shields is a 84 y.o. female here for an acute visit.  History of Present Illness:   (SCRIBE ATTESTATION)  HPI:   PMHx, SurgHx, SocialHx, Medications, and Allergies were reviewed in the Visit Navigator and updated as appropriate.  Current Medications   Current Outpatient Medications:  .  acetaminophen ([employer] ARTHRITIS PAIN RELIEF) 650 MG CR tablet, Take 1,300 mg by mouth 2 (two) times daily., Disp: , Rfl:  .  albuterol (PROAIR HFA) 108 (90 Base) MCG/ACT inhaler, INHALE 2 PUFFS EVERY 4 HOURS AS NEEDED FOR COUGHING SPELLS (Patient taking differently: Inhale 2 puffs into the lungs every 4 (four) hours as needed (coughing spells). ), Disp: 8.5 Inhaler, Rfl: 2 .  aspirin 325 MG tablet, Take 1 tablet (325 mg total) by mouth daily., Disp: 30 tablet, Rfl: 3 .  benzonatate (TESSALON) 200 MG capsule, TAKE ONE CAPSULE BY MOUTH THREE TIMES A DAY AS NEEDED FOR COUGH, Disp: 45 capsule, Rfl: 1 .  clopidogrel (PLAVIX) 75 MG tablet, TAKE ONE TABLET BY MOUTH DAILY, Disp: 90 tablet, Rfl: 2 .  Coenzyme Q10 (COQ10 PO), Take 1 capsule by mouth daily. , Disp: , Rfl:  .  ferrous sulfate 325 (65 FE) MG tablet, TAKE 1 TABLET BY MOUTH EVERY DAY (Patient taking differently: Take 325 mg by mouth daily with breakfast. ), Disp: 90 tablet, Rfl: 3 .  fexofenadine (ALLEGRA) 180 MG tablet, Take 180 mg by mouth daily., Disp: , Rfl:  .  fluticasone (FLONASE) 50 MCG/ACT nasal spray, Place 2 sprays into both nostrils daily., Disp: 16 g, Rfl: 2 .  hydrochlorothiazide (HYDRODIURIL) 25 MG tablet, TAKE ONE TABLET BY MOUTH DAILY (Patient taking differently: Take 25 mg by mouth daily. ), Disp: 90 tablet, Rfl: 0 .  hydrocortisone 2.5 % cream, Apply 1 application topically 2 (two) times daily as needed. Inflammation/arthritis, Disp: , Rfl:  .  meclizine (ANTIVERT) 25 MG tablet, Take 1 tablet (25 mg total) by mouth 3 (three) times daily as needed for dizziness., Disp: 30 tablet, Rfl: 0 .  Multiple Vitamin (MULTIVITAMIN WITH  MINERALS) TABS tablet, Take 1 tablet by mouth daily., Disp: , Rfl:  .  pantoprazole (PROTONIX) 40 MG tablet, TAKE ONE TABLET BY MOUTH DAILY, Disp: 90 tablet, Rfl: 0 .  potassium chloride SA (K-DUR) 20 MEQ tablet, TAKE ONE TABLET BY MOUTH DAILY (Patient taking differently: Take 20 mEq by mouth daily. ), Disp: 90 tablet, Rfl: 0 .  Propylene Glycol (SYSTANE BALANCE OP), Place 1 drop into both eyes every 6 (six) hours as needed (dry eyes)., Disp: , Rfl:  .  simvastatin (ZOCOR) 40 MG tablet, Take 1 tablet (40 mg total) by mouth daily., Disp: 30 tablet, Rfl: 5 .  traMADol (ULTRAM) 50 MG tablet, Take 50 mg by mouth every 6 (six) hours as needed (pain)., Disp: , Rfl:  .  Vitamin D, Cholecalciferol, 1000 units CAPS, Take 1,000 Units by mouth daily. , Disp: , Rfl:    Allergies  Allergen Reactions  . Alcohol Other (See Comments)    Recovering Alcoholic*  . Codeine     Patient prefers not to take this due to history of alcoholism  . Dilaudid [Hydromorphone Hcl]     Pt had hypotension after dilaudid 1mg  IV while in the OR, required temporary pressor intervention.   Review of Systems   Pertinent items are noted in the HPI. Otherwise, ROS is negative.  Vitals  There were no vitals filed for this visit.   There is no height or weight on  file to calculate BMI.  Physical Exam   Physical Exam  Results for orders placed or performed in visit on 01/20/19  Lipid panel  Result Value Ref Range   Cholesterol 178 0 - 200 mg/dL   Triglycerides 236.0 (H) 0.0 - 149.0 mg/dL   HDL 47.60 >39.00 mg/dL   VLDL 47.2 (H) 0.0 - 40.0 mg/dL   Total CHOL/HDL Ratio 4    NonHDL 130.72   LDL cholesterol, direct  Result Value Ref Range   Direct LDL 94.0 mg/dL    Assessment and Plan   There are no diagnoses linked to this encounter.  . Reviewed expectations re: course of current medical issues. . Discussed self-management of symptoms. . Outlined signs and symptoms indicating need for more acute  intervention. . Patient verbalized understanding and all questions were answered. Marland Kitchen Health Maintenance issues including appropriate healthy diet, exercise, and smoking avoidance were discussed with patient. . See orders for this visit as documented in the electronic medical record. . Patient received an After Visit Summary.  *** CMA served as Education administrator during this visit. History, Physical, and Plan performed by medical provider. The above documentation has been reviewed and is accurate and complete. Briscoe Deutscher, D.O.  Briscoe Deutscher, DO [place], [facility] 03/04/2019

## 2019-03-05 ENCOUNTER — Other Ambulatory Visit: Payer: Self-pay

## 2019-03-05 ENCOUNTER — Telehealth (INDEPENDENT_AMBULATORY_CARE_PROVIDER_SITE_OTHER): Payer: Medicare HMO | Admitting: Neurology

## 2019-03-05 ENCOUNTER — Encounter: Payer: Self-pay | Admitting: Neurology

## 2019-03-05 VITALS — BP 144/78 | Ht 65.0 in | Wt 189.0 lb

## 2019-03-05 DIAGNOSIS — G454 Transient global amnesia: Secondary | ICD-10-CM | POA: Diagnosis not present

## 2019-03-06 ENCOUNTER — Encounter: Payer: Self-pay | Admitting: Family Medicine

## 2019-03-06 NOTE — Telephone Encounter (Signed)
I spoke with patient to inform her that I will send an message to Dr. Juleen China to verify if she is ok with patient continuing medication.   K-Dur 20MEQ  Last fill 11/28/18  #90/0 Last OV 10/21/18 Ok to refill?

## 2019-03-07 ENCOUNTER — Other Ambulatory Visit: Payer: Self-pay

## 2019-03-07 MED ORDER — POTASSIUM CHLORIDE CRYS ER 20 MEQ PO TBCR: 20.0000 meq | EXTENDED_RELEASE_TABLET | Freq: Every day | ORAL | 0 refills | Status: DC

## 2019-03-07 NOTE — Telephone Encounter (Signed)
Done

## 2019-03-11 DIAGNOSIS — M25571 Pain in right ankle and joints of right foot: Secondary | ICD-10-CM | POA: Diagnosis not present

## 2019-03-19 ENCOUNTER — Other Ambulatory Visit: Payer: Self-pay | Admitting: Emergency Medicine

## 2019-03-21 ENCOUNTER — Encounter: Payer: Self-pay | Admitting: Family Medicine

## 2019-03-21 MED ORDER — HYDROCHLOROTHIAZIDE 25 MG PO TABS: 25.0000 mg | ORAL_TABLET | Freq: Every day | ORAL | 0 refills | Status: DC

## 2019-03-21 NOTE — Telephone Encounter (Signed)
Rx request Last fill 11/28/18  #90/0 Last OV 10/21/18

## 2019-04-01 ENCOUNTER — Telehealth: Payer: Self-pay | Admitting: Family Medicine

## 2019-04-01 NOTE — Telephone Encounter (Signed)
I left a message asking the patient to call me at [phone] to schedule AWV with Courtney on 04/04/2019 at 9:30 w/ flu shot.  Im waiting for a call back to either confirm or decline the appointment. VDM (Dee-Dee)

## 2019-04-02 ENCOUNTER — Telehealth (HOSPITAL_COMMUNITY): Payer: Self-pay | Admitting: Radiology

## 2019-04-02 ENCOUNTER — Other Ambulatory Visit (HOSPITAL_COMMUNITY): Payer: Self-pay | Admitting: Interventional Radiology

## 2019-04-02 ENCOUNTER — Telehealth: Payer: Self-pay | Admitting: Neurology

## 2019-04-02 ENCOUNTER — Telehealth: Payer: Self-pay | Admitting: *Deleted

## 2019-04-02 DIAGNOSIS — R6889 Other general symptoms and signs: Secondary | ICD-10-CM

## 2019-04-02 DIAGNOSIS — G454 Transient global amnesia: Secondary | ICD-10-CM

## 2019-04-02 DIAGNOSIS — G43109 Migraine with aura, not intractable, without status migrainosus: Secondary | ICD-10-CM

## 2019-04-02 DIAGNOSIS — I10 Essential (primary) hypertension: Secondary | ICD-10-CM

## 2019-04-02 DIAGNOSIS — G459 Transient cerebral ischemic attack, unspecified: Secondary | ICD-10-CM

## 2019-04-02 DIAGNOSIS — I671 Cerebral aneurysm, nonruptured: Secondary | ICD-10-CM

## 2019-04-02 DIAGNOSIS — E785 Hyperlipidemia, unspecified: Secondary | ICD-10-CM

## 2019-04-02 NOTE — Telephone Encounter (Signed)
MRI of brain without contrast is fine Routine EEG I would recommend that she contact Dr. Arlean Hopping office because she stated that she was supposed to follow up with them this month.

## 2019-04-02 NOTE — Telephone Encounter (Signed)
Pt called wanting to know when her f/u MRI/MRA is due. I told her that I had already sent in her insurance auth request and will call her back once I get it to schedule. Pt agrees with this plan. JM

## 2019-04-02 NOTE — Telephone Encounter (Signed)
Dr. Tomi Likens this patient had a televisit 8/26 with you and you wanted MRI brain and were going to check with Dr. Estanislado Pandy to see if he had other testing that she could have them done at the same time.  As it has been several weeks she wanted to know about the scheduling of brain MRI and EEG. She hasnt heard from Dr. Anette Guarneri office about any testing for them.  I can go ahead and put in the order for the brain MRI - would you like that w or with / without or without contrast?  Also EEG - just the routine one hour one ?

## 2019-04-02 NOTE — Telephone Encounter (Signed)
Called patient back (she had sent my chart message) and informed her I am checking back on the status of the MRI/EEG orders and will have them ordered and call her back today. Have sent message to Dr. Tomi Likens regarding his office note 8/26 that he was going to try and coordinate if Dr. Estanislado Pandy had any additional testing he wanted done at the same time. Patient understood above and thanked me for calling.

## 2019-04-02 NOTE — Telephone Encounter (Signed)
Patient called and left message with the after hour service on 04-01-19 @ 4:32 pm   She states that she called at 3:00 and has not heard back she needs to check the status of the order for the EEG and MRI

## 2019-04-02 NOTE — Telephone Encounter (Signed)
Called and left message for patient that our office will be calling to schedule EEG. Also stated [facility] will call her to schedule MRI and left [facility] number if she needed to follow up. Stated that Dr. Tomi Likens suggested patient call Dr. Arlean Hopping office as she was to follow up with them this month anyway. Asked patient to please call back if she had any further questions.  EEG and MRI brain without contrast entered in computer. Asked front desk to call her to schedule EEG.

## 2019-04-02 NOTE — Telephone Encounter (Signed)
See other telephone note - EEG/MRI ordered and patient made aware.

## 2019-04-04 ENCOUNTER — Ambulatory Visit (INDEPENDENT_AMBULATORY_CARE_PROVIDER_SITE_OTHER): Payer: Medicare HMO

## 2019-04-04 ENCOUNTER — Other Ambulatory Visit: Payer: Self-pay

## 2019-04-04 ENCOUNTER — Ambulatory Visit: Payer: Medicare HMO

## 2019-04-04 VITALS — BP 128/72 | Temp 97.1°F | Ht 65.0 in | Wt 195.0 lb

## 2019-04-04 DIAGNOSIS — Z23 Encounter for immunization: Secondary | ICD-10-CM | POA: Diagnosis not present

## 2019-04-04 DIAGNOSIS — Z0000 Encounter for general adult medical examination without abnormal findings: Secondary | ICD-10-CM

## 2019-04-04 NOTE — Progress Notes (Signed)
Subjective:   Sheila Shields is a 84 y.o. female who presents for Medicare Annual (Subsequent) preventive examination.  Review of Systems:   Cardiac Risk Factors include: advanced age (>84men, >54 women);hypertension     Objective:     Vitals: BP 128/72 (BP Location: Left Arm, Patient Position: Sitting, Cuff Size: Large)    Temp (!) 97.1 F (36.2 C) (Temporal)    Ht 5\' 5"  (1.651 m)    Wt 195 lb (88.5 kg)    BMI 32.45 kg/m   Body mass index is 32.45 kg/m.  Advanced Directives 04/04/2019 03/05/2019 12/31/2018 12/30/2018 12/30/2018 11/28/2018 10/22/2018  Does Patient Have a Medical Advance Directive? Yes Yes Yes Yes No Yes Yes  Type of Advance Directive Living will;Healthcare Power of Attorney - - Living will;Healthcare Power of [place];Living will -  Does patient want to make changes to medical advance directive? No - Patient declined - - No - Patient declined - No - Patient declined -  Copy of [place] in Chart? No - copy requested - - No - copy requested - No - copy requested -  Would patient like information on creating a medical advance directive? - - - - No - Patient declined - -  Pre-existing out of facility DNR order (yellow form or pink MOST form) - - - - - - -    Tobacco Social History   Tobacco Use  Smoking Status Former Smoker   Packs/day: 2.00   Years: 30.00   Pack years: 60.00   Types: Cigarettes   Quit date: 07/10/1984   Years since quitting: 34.7  Smokeless Tobacco Never Used     Counseling given: Not Answered   Clinical Intake:  Pre-visit preparation completed: Yes  Pain : No/denies pain     Diabetes: No  How often do you need to have someone help you when you read instructions, pamphlets, or other written materials from your doctor or pharmacy?: 1 - Never  Interpreter Needed?: No  Information entered by :: Denman George LPN  Past Medical History:  Diagnosis Date   Alcoholism in remission  ([place]) 08/27/2007   Anemia    Arthritis    OA   COPD 12/17/2006   Diverticulosis 03/27/2007   GERD 05/01/2007   HYPERGLYCEMIA 08/27/2007   HYPERLIPIDEMIA 12/17/2006   HYPERTENSION 12/17/2006   Mood disorder ([place])    hx anxiety and depression   Pneumonia    AS A CHILD   Rectal polyp 03/27/2007   adenoma   TIA (transient ischemic attack)    Past Surgical History:  Procedure Laterality Date   ABDOMINAL HYSTERECTOMY  1978   fibroma   APPENDECTOMY  2002   BREAST BIOPSY     negative   BREAST EXCISIONAL BIOPSY Right 2003   CARPAL TUNNEL RELEASE Left 2010 or 2011   COLONOSCOPY W/ POLYPECTOMY  03/27/2007; n7/19/2012   2008: 4 mm adenoma, diverticulosis 2012: ileitis ? NSAID - likely, 2-3 mm cecal polyp LYMPHOID FOLLICLE, diverticulosis   ESOPHAGOGASTRODUODENOSCOPY  01/26/2011   reflux esophagitis, colonscopy done also   HAMMER TOE SURGERY  2008   left foot   HERNIA REPAIR Right 1996?   IR ANGIO INTRA EXTRACRAN SEL COM CAROTID INNOMINATE UNI R MOD SED  11/28/2018   IR ANGIO INTRA EXTRACRAN SEL INTERNAL CAROTID UNI R MOD SED  12/30/2018   IR ANGIO VERTEBRAL SEL SUBCLAVIAN INNOMINATE UNI R MOD SED  11/28/2018   IR ANGIOGRAM FOLLOW UP STUDY  12/30/2018   IR TRANSCATH/EMBOLIZ  12/30/2018   IR US GUIDE VASC ACCESS RIGHT  11/28/2018   ORIF ANKLE FRACTURE Right 12/22/2014   Procedure: OPEN REDUCTION INTERNAL FIXATION (ORIF) ANKLE FRACTURE;  Surgeon: Susa Day, MD;  Location: [facility];  Service: Orthopedics;  Laterality: Right;   RADIOLOGY WITH ANESTHESIA N/A 12/30/2018   Procedure: RADIOLOGY WITH ANESTHESIA;  Surgeon: Luanne Bras, MD;  Location: [facility];  Service: Radiology;  Laterality: N/A;   SHOULDER OPEN ROTATOR CUFF REPAIR Left 03/06/2013   Procedure: LEFT ROTATOR CUFF REPAIR, SUBACROMIAL DECOMPRESSION, PATCH GRAFT, MANIPULATION UNDER ANESTHESIA;  Surgeon: Johnn Hai, MD;  Location: [facility];  Service: Orthopedics;  Laterality: Left;   Family History  Problem  Relation Age of Onset   Throat cancer Mother    Heart attack Father 20   Idiopathic pulmonary fibrosis Brother 91   Cancer Brother        sarcoma   Heart disease Brother    Colon cancer Neg Hx    Esophageal cancer Neg Hx    Stomach cancer Neg Hx    Social History   Socioeconomic History   Marital status: Married    Spouse name: Not on file   Number of children: 2   Years of education: Not on file   Highest education level: Not on file  Occupational History   Occupation: retired  Scientist, product/process development strain: Not on file   Food insecurity    Worry: Not on file    Inability: Not on Lexicographer needs    Medical: Not on file    Non-medical: Not on file  Tobacco Use   Smoking status: Former Smoker    Packs/day: 2.00    Years: 30.00    Pack years: 60.00    Types: Cigarettes    Quit date: 07/10/1984    Years since quitting: 34.7   Smokeless tobacco: Never Used  Substance and Sexual Activity   Alcohol use: No    Alcohol/week: 0.0 standard drinks    Comment: no alcoho since 1983   Drug use: No   Sexual activity: Not on file  Lifestyle   Physical activity    Days per week: Not on file    Minutes per session: Not on file   Stress: Not on file  Relationships   Social connections    Talks on phone: Not on file    Gets together: Not on file    Attends religious service: Not on file    Active member of club or organization: Not on file    Attends meetings of clubs or organizations: Not on file    Relationship status: Not on file  Other Topics Concern   Not on file  Social History Narrative   Work or School: very active in [place] Situation: lives with husband      Spiritual Beliefs: Christian      Lifestyle:tries to walk; diet ok - denies any alcohol or tobacco use in many many years      Caffeine 1 cup coffee/day    Outpatient Encounter Medications as of 04/04/2019  Medication Sig   acetaminophen  ([facility] ARTHRITIS PAIN RELIEF) 650 MG CR tablet Take 1,300 mg by mouth 2 (two) times daily.   albuterol (PROAIR HFA) 108 (90 Base) MCG/ACT inhaler INHALE 2 PUFFS EVERY 4 HOURS AS NEEDED FOR COUGHING SPELLS (Patient taking differently: Inhale 2 puffs into the lungs every 4 (four)  hours as needed (coughing spells). )   aspirin 325 MG tablet Take 1 tablet (325 mg total) by mouth daily.   benzonatate (TESSALON) 200 MG capsule TAKE ONE CAPSULE BY MOUTH THREE TIMES A DAY AS NEEDED FOR COUGH   clopidogrel (PLAVIX) 75 MG tablet TAKE ONE TABLET BY MOUTH DAILY   Coenzyme Q10 (COQ10 PO) Take 1 capsule by mouth daily.    ferrous sulfate 325 (65 FE) MG tablet TAKE 1 TABLET BY MOUTH EVERY DAY (Patient taking differently: Take 325 mg by mouth daily with breakfast. )   fexofenadine (ALLEGRA) 180 MG tablet Take 180 mg by mouth daily.   fluticasone (FLONASE) 50 MCG/ACT nasal spray Place 2 sprays into both nostrils daily.   hydrochlorothiazide (HYDRODIURIL) 25 MG tablet Take 1 tablet (25 mg total) by mouth daily.   hydrocortisone 2.5 % cream Apply 1 application topically 2 (two) times daily as needed. Inflammation/arthritis   meclizine (ANTIVERT) 25 MG tablet Take 1 tablet (25 mg total) by mouth 3 (three) times daily as needed for dizziness.   Multiple Vitamin (MULTIVITAMIN WITH MINERALS) TABS tablet Take 1 tablet by mouth daily.   pantoprazole (PROTONIX) 40 MG tablet TAKE ONE TABLET BY MOUTH DAILY   potassium chloride SA (K-DUR) 20 MEQ tablet Take 1 tablet (20 mEq total) by mouth daily.   Propylene Glycol (SYSTANE BALANCE OP) Place 1 drop into both eyes every 6 (six) hours as needed (dry eyes).   simvastatin (ZOCOR) 40 MG tablet Take 1 tablet (40 mg total) by mouth daily.   traMADol (ULTRAM) 50 MG tablet Take 50 mg by mouth every 6 (six) hours as needed (pain).   Vitamin D, Cholecalciferol, 1000 units CAPS Take 1,000 Units by mouth daily.    No facility-administered encounter medications on file as  of 04/04/2019.     Activities of Daily Living In your present state of health, do you have any difficulty performing the following activities: 04/04/2019 12/30/2018  Hearing? N N  Vision? N N  Difficulty concentrating or making decisions? N Y  Walking or climbing stairs? N N  Dressing or bathing? N N  Doing errands, shopping? N Y  Conservation officer, nature and eating ? N -  Using the Toilet? N -  In the past six months, have you accidently leaked urine? N -  Do you have problems with loss of bowel control? N -  Managing your Medications? N -  Managing your Finances? N -  Housekeeping or managing your Housekeeping? N -  Some recent data might be hidden    Patient Care Team: Briscoe Deutscher, DO as PCP - General (Family Medicine) Pieter Partridge, DO as Consulting Physician (Neurology) Collene Gobble, MD as Consulting Physician (Pulmonary Disease) [facility] as Consulting Physician Jarome Matin, MD as Consulting Physician (Dermatology) Luanne Bras, MD as Consulting Physician (Interventional Radiology)    Assessment:   This is a routine wellness examination for Yanelly.  Exercise Activities and Dietary recommendations Current Exercise Habits: The patient does not participate in regular exercise at present  Goals     Patient Stated     Prepare for a new puppy !      Weight (lb) < 190 lb (86.2 kg)     Will watch sweet intake- come up w a plan  Will consider cutting portions out    Fat free or low fat dairy products Fish high in omega-3 acids ( salmon, tuna, trout) Fruits, such as apples, bananas, oranges, pears, prunes Legumes, such as  kidney beans, lentils, checkpeas, black-eyed peas and lima beans Vegetables; broccoli, cabbage, carrots Whole grains;   Plant fats are better; decrease "white" foods as pasta, rice, bread and desserts, sugar; Avoid red meat (limiting) palm and coconut oils; sugary foods and beverages  Two nutrients that raise blood chol levels  are saturated fats and trans fat; in hydrogenated oils and fats, as stick margarine, baked goods (cookes, cakes, pies, crackers; frosting; and coffee creamers;   Some Fats lower cholesterol: Monounsaturated and polyunsaturated  Avocados Corn, sunflower, and soybean oils Nuts and seeds, such as walnuts Olive, canola, peanut, safflower, and sesame oils Peanut butter Salmon and trout Tofu          Fall Risk Fall Risk  04/04/2019 03/05/2019 03/05/2019 02/21/2019 10/22/2018  Falls in the past year? 0 0 0 0 0  Number falls in past yr: 0 - - - 0  Injury with Fall? 0 - - - 0  Follow up Education provided;Falls evaluation completed;Falls prevention discussed - - Falls evaluation completed Falls evaluation completed   Is the patient's home free of loose throw rugs in walkways, pet beds, electrical cords, etc?   yes      Grab bars in the bathroom? yes      Handrails on the stairs?   yes      Adequate lighting?   yes  Timed Get Up and Go performed: completed and within normal timeframe; no gait abnormalities noted    Depression Screen PHQ 2/9 Scores 04/04/2019 02/26/2018 02/22/2017 01/20/2016  PHQ - 2 Score 0 0 0 0     Cognitive Function-no cognitive concern at this time  MMSE - Mini Mental State Exam 02/22/2017  Not completed: (No Data)     6CIT Screen 04/04/2019  What Year? 0 points  What month? 0 points  What time? 0 points  Count back from 20 0 points  Months in reverse 0 points  Repeat phrase 0 points  Total Score 0    Immunization History  Administered Date(s) Administered   Fluad Quad(high Dose 65+) 04/04/2019   Influenza Split 04/09/2012   Influenza Whole 05/01/2007, 04/06/2008, 04/21/2009, 03/24/2010, 04/10/2011   Influenza, High Dose Seasonal PF 04/09/2015, 03/17/2016, 04/16/2017, 03/20/2018   Influenza,inj,Quad PF,6+ Mos 03/03/2013   Influenza,inj,quad, With Preservative 03/20/2018   Influenza-Unspecified 04/09/2014, 04/09/2015, 04/09/2017, 03/20/2018   Pneumococcal  Conjugate-13 05/05/2015   Pneumococcal Polysaccharide-23 04/09/2004   Td 12/08/2005   Tdap 12/22/2014   Zoster 01/06/2008   Zoster Recombinat (Shingrix) 08/01/2017, 10/03/2017    Qualifies for Shingles Vaccine?Shingrix completed   Screening Tests Health Maintenance  Topic Date Due   TETANUS/TDAP  12/21/2024   INFLUENZA VACCINE  Completed   DEXA SCAN  Completed   PNA vac Low Risk Adult  Completed    Cancer Screenings: Lung: Low Dose CT Chest recommended if Age 57-80 years, 30 pack-year currently smoking OR have quit w/in 15years. Patient does not qualify. Breast:  Up to date on Mammogram? Yes   Up to date of Bone Density/Dexa? Yes Colorectal: colonoscopy 01/26/11     Plan:  I have personally reviewed and addressed the Medicare Annual Wellness questionnaire and have noted the following in the patients chart:  A. Medical and social history B. Use of alcohol, tobacco or illicit drugs  C. Current medications and supplements D. Functional ability and status E.  Nutritional status F.  Physical activity G. Advance directives H. List of other physicians I.  Hospitalizations, surgeries, and ER visits in previous 12 months J.  Vitals  K. Screenings such as hearing and vision if needed, cognitive and depression L. Referrals, records requested, and appointments- none   In addition, I have reviewed and discussed with patient certain preventive protocols, quality metrics, and best practice recommendations. A written personalized care plan for preventive services as well as general preventive health recommendations were provided to patient.   Signed,  Denman George, LPN  Nurse Health Advisor   Nurse Notes: no additional

## 2019-04-04 NOTE — Patient Instructions (Signed)
Sheila Shields , Thank you for taking time to come for your Medicare Wellness Visit. I appreciate your ongoing commitment to your health goals. Please review the following plan we discussed and let me know if I can assist you in the future.   Screening recommendations/referrals: Colorectal Screening: completed 01/26/11 Mammogram: completed 01/20/19 Bone Density: up to date   Vision and Dental Exams: Recommended annual ophthalmology exams for early detection of glaucoma and other disorders of the eye Recommended annual dental exams for proper oral hygiene  Vaccinations: Influenza vaccine: today  Pneumococcal vaccine: up to date; last 05/05/15 Tdap vaccine: up to date; last 12/22/14  Shingles vaccine: Shingrix completed   Advanced directives: Please bring a copy of your POA (Power of [place]) and/or Living Will to your next appointment.  Goals: Recommend to drink at least 6-8 8oz glasses of water per day.  Next appointment: Please schedule your Annual Wellness Visit with your Nurse Health Advisor in one year.  Preventive Care 75 Years and Older, Female Preventive care refers to lifestyle choices and visits with your health care provider that can promote health and wellness. What does preventive care include?  A yearly physical exam. This is also called an annual well check.  Dental exams once or twice a year.  Routine eye exams. Ask your health care provider how often you should have your eyes checked.  Personal lifestyle choices, including:  Daily care of your teeth and gums.  Regular physical activity.  Eating a healthy diet.  Avoiding tobacco and drug use.  Limiting alcohol use.  Practicing safe sex.  Taking low-dose aspirin every day if recommended by your health care provider.  Taking vitamin and mineral supplements as recommended by your health care provider. What happens during an annual well check? The services and screenings done by your health care provider  during your annual well check will depend on your age, overall health, lifestyle risk factors, and family history of disease. Counseling  Your health care provider may ask you questions about your:  Alcohol use.  Tobacco use.  Drug use.  Emotional well-being.  Home and relationship well-being.  Sexual activity.  Eating habits.  History of falls.  Memory and ability to understand (cognition).  Work and work Statistician.  Reproductive health. Screening  You may have the following tests or measurements:  Height, weight, and BMI.  Blood pressure.  Lipid and cholesterol levels. These may be checked every 5 years, or more frequently if you are over 33 years old.  Skin check.  Lung cancer screening. You may have this screening every year starting at age 65 if you have a 30-pack-year history of smoking and currently smoke or have quit within the past 15 years.  Fecal occult blood test (FOBT) of the stool. You may have this test every year starting at age 75.  Flexible sigmoidoscopy or colonoscopy. You may have a sigmoidoscopy every 5 years or a colonoscopy every 10 years starting at age 77.  Hepatitis C blood test.  Hepatitis B blood test.  Sexually transmitted disease (STD) testing.  Diabetes screening. This is done by checking your blood sugar (glucose) after you have not eaten for a while (fasting). You may have this done every 1-3 years.  Bone density scan. This is done to screen for osteoporosis. You may have this done starting at age 67.  Mammogram. This may be done every 1-2 years. Talk to your health care provider about how often you should have regular mammograms. Talk with your  health care provider about your test results, treatment options, and if necessary, the need for more tests. Vaccines  Your health care provider may recommend certain vaccines, such as:  Influenza vaccine. This is recommended every year.  Tetanus, diphtheria, and acellular pertussis  (Tdap, Td) vaccine. You may need a Td booster every 10 years.  Zoster vaccine. You may need this after age 42.  Pneumococcal 13-valent conjugate (PCV13) vaccine. One dose is recommended after age 71.  Pneumococcal polysaccharide (PPSV23) vaccine. One dose is recommended after age 19. Talk to your health care provider about which screenings and vaccines you need and how often you need them. This information is not intended to replace advice given to you by your health care provider. Make sure you discuss any questions you have with your health care provider. Document Released: 07/23/2015 Document Revised: 03/15/2016 Document Reviewed: 04/27/2015 Elsevier Interactive Patient Education  2017 [place] Prevention in the Home Falls can cause injuries. They can happen to people of all ages. There are many things you can do to make your home safe and to help prevent falls. What can I do on the outside of my home?  Regularly fix the edges of walkways and driveways and fix any cracks.  Remove anything that might make you trip as you walk through a door, such as a raised step or threshold.  Trim any bushes or trees on the path to your home.  Use bright outdoor lighting.  Clear any walking paths of anything that might make someone trip, such as rocks or tools.  Regularly check to see if handrails are loose or broken. Make sure that both sides of any steps have handrails.  Any raised decks and porches should have guardrails on the edges.  Have any leaves, snow, or ice cleared regularly.  Use sand or salt on walking paths during winter.  Clean up any spills in your garage right away. This includes oil or grease spills. What can I do in the bathroom?  Use night lights.  Install grab bars by the toilet and in the tub and shower. Do not use towel bars as grab bars.  Use non-skid mats or decals in the tub or shower.  If you need to sit down in the shower, use a plastic, non-slip  stool.  Keep the floor dry. Clean up any water that spills on the floor as soon as it happens.  Remove soap buildup in the tub or shower regularly.  Attach bath mats securely with double-sided non-slip rug tape.  Do not have throw rugs and other things on the floor that can make you trip. What can I do in the bedroom?  Use night lights.  Make sure that you have a light by your bed that is easy to reach.  Do not use any sheets or blankets that are too big for your bed. They should not hang down onto the floor.  Have a firm chair that has side arms. You can use this for support while you get dressed.  Do not have throw rugs and other things on the floor that can make you trip. What can I do in the kitchen?  Clean up any spills right away.  Avoid walking on wet floors.  Keep items that you use a lot in easy-to-reach places.  If you need to reach something above you, use a strong step stool that has a grab bar.  Keep electrical cords out of the way.  Do not use  floor polish or wax that makes floors slippery. If you must use wax, use non-skid floor wax.  Do not have throw rugs and other things on the floor that can make you trip. What can I do with my stairs?  Do not leave any items on the stairs.  Make sure that there are handrails on both sides of the stairs and use them. Fix handrails that are broken or loose. Make sure that handrails are as long as the stairways.  Check any carpeting to make sure that it is firmly attached to the stairs. Fix any carpet that is loose or worn.  Avoid having throw rugs at the top or bottom of the stairs. If you do have throw rugs, attach them to the floor with carpet tape.  Make sure that you have a light switch at the top of the stairs and the bottom of the stairs. If you do not have them, ask someone to add them for you. What else can I do to help prevent falls?  Wear shoes that:  Do not have high heels.  Have rubber bottoms.  Are  comfortable and fit you well.  Are closed at the toe. Do not wear sandals.  If you use a stepladder:  Make sure that it is fully opened. Do not climb a closed stepladder.  Make sure that both sides of the stepladder are locked into place.  Ask someone to hold it for you, if possible.  Clearly mark and make sure that you can see:  Any grab bars or handrails.  First and last steps.  Where the edge of each step is.  Use tools that help you move around (mobility aids) if they are needed. These include:  Canes.  Walkers.  Scooters.  Crutches.  Turn on the lights when you go into a dark area. Replace any light bulbs as soon as they burn out.  Set up your furniture so you have a clear path. Avoid moving your furniture around.  If any of your floors are uneven, fix them.  If there are any pets around you, be aware of where they are.  Review your medicines with your doctor. Some medicines can make you feel dizzy. This can increase your chance of falling. Ask your doctor what other things that you can do to help prevent falls. This information is not intended to replace advice given to you by your health care provider. Make sure you discuss any questions you have with your health care provider. Document Released: 04/22/2009 Document Revised: 12/02/2015 Document Reviewed: 07/31/2014 Elsevier Interactive Patient Education  2017 [employer].

## 2019-04-04 NOTE — Progress Notes (Signed)
I have reviewed documentation for AWV and Advance Care planning provided by [facility], I agree with documentation, I was immediately available for any questions. Kermit Arnette, DO   

## 2019-04-07 ENCOUNTER — Ambulatory Visit (INDEPENDENT_AMBULATORY_CARE_PROVIDER_SITE_OTHER): Payer: Medicare HMO | Admitting: Neurology

## 2019-04-07 ENCOUNTER — Other Ambulatory Visit: Payer: Self-pay

## 2019-04-07 ENCOUNTER — Other Ambulatory Visit: Payer: Self-pay | Admitting: *Deleted

## 2019-04-07 DIAGNOSIS — G454 Transient global amnesia: Secondary | ICD-10-CM

## 2019-04-07 DIAGNOSIS — G459 Transient cerebral ischemic attack, unspecified: Secondary | ICD-10-CM | POA: Diagnosis not present

## 2019-04-07 DIAGNOSIS — I671 Cerebral aneurysm, nonruptured: Secondary | ICD-10-CM | POA: Diagnosis not present

## 2019-04-07 DIAGNOSIS — G43109 Migraine with aura, not intractable, without status migrainosus: Secondary | ICD-10-CM

## 2019-04-08 NOTE — Procedures (Signed)
ELECTROENCEPHALOGRAM REPORT  Date of Study: 04/07/2019  Patient's Name: Sheila Shields MRN: [id number] Date of Birth: October 03, 1934  Referring Provider: Metta Clines, DO  Clinical History: 84 year old female with episode of transient amnesia.  Similar episode 20 years ago.  Medications: [employer] ARTHRITIS PAIN RELIEF 650 MG CR tablet PROAIR HFA 108 (90 Base) MCG/ACT inhaler aspirin 325 MG tablet TESSALON 200 MG capsule PLAVIX 75 MG tablet COQ10 PO 65 FE MG tablet ALLEGRA 180 MG tablet FLONASE 50 MCG/ACT nasal spray HYDRODIURIL 25 MG tablet hydrocortisone 2.5 % cream ANTIVERT 25 MG tablet MULTIVITAMIN WITH MINERALS TABS tablet PROTONIX 40 MG tablet K-DUR 20 MEQ tablet SYSTANE BALANCE OP ULTRAM 50 MG tablet Vitamin D, Cholecalciferol, 1000 units CAPS  Technical Summary: A multichannel digital EEG recording measured by the international 10-20 system with electrodes applied with paste and impedances below 5000 ohms performed in our laboratory with EKG monitoring in an awake and asleep patient.  Hyperventilation was not performed as patient wearing a mask for COVID.  Photic stimulation was performed.  The digital EEG was referentially recorded, reformatted, and digitally filtered in a variety of bipolar and referential montages for optimal display.    Description: The patient is awake and asleep during the recording.  During maximal wakefulness, there is a symmetric, medium voltage 10 Hz posterior dominant rhythm that attenuates with eye opening.  The record is symmetric.  During drowsiness and sleep, there is an increase in theta slowing of the background.  Vertex waves and symmetric sleep spindles were seen.  Photic stimulation did not elicit any abnormalities.  There were no epileptiform discharges or electrographic seizures seen.    EKG lead was unremarkable.  Impression: This awake and asleep EEG is normal.    Clinical Correlation: A normal EEG does not exclude a clinical diagnosis of  epilepsy.  If further clinical questions remain, prolonged EEG may be helpful.  Clinical correlation is advised.   Metta Clines, DO

## 2019-04-09 ENCOUNTER — Other Ambulatory Visit: Payer: Self-pay | Admitting: Emergency Medicine

## 2019-04-16 DIAGNOSIS — R69 Illness, unspecified: Secondary | ICD-10-CM | POA: Diagnosis not present

## 2019-04-23 ENCOUNTER — Other Ambulatory Visit: Payer: Self-pay

## 2019-04-23 ENCOUNTER — Ambulatory Visit (INDEPENDENT_AMBULATORY_CARE_PROVIDER_SITE_OTHER): Payer: Medicare HMO | Admitting: Family Medicine

## 2019-04-23 ENCOUNTER — Encounter: Payer: Self-pay | Admitting: Family Medicine

## 2019-04-23 ENCOUNTER — Ambulatory Visit: Payer: Self-pay | Admitting: *Deleted

## 2019-04-23 VITALS — BP 134/84 | HR 82 | Temp 97.8°F | Resp 18 | Ht 66.0 in | Wt 197.0 lb

## 2019-04-23 DIAGNOSIS — R58 Hemorrhage, not elsewhere classified: Secondary | ICD-10-CM | POA: Diagnosis not present

## 2019-04-23 NOTE — Telephone Encounter (Signed)
   Reason for Disposition . Taking Coumadin (warfarin) or other strong blood thinner, or known bleeding disorder (e.g., thrombocytopenia)  Answer Assessment - Initial Assessment Questions 1. APPEARANCE of BRUISE: "Describe the bruise."      Upper left arm - black and blue bruise 2. SIZE: "How large is the bruise?"       Healing now  3. NUMBER: "How many bruises are there?"      Numerous due to aspirin and plavix  4. LOCATION: "Where is the bruise located?"     Upper left arm 5. ONSET: "How long ago did the bruise occur?"      1 week  6. CAUSE: "Tell me how it happened."     Unsure 7. MEDICAL HISTORY: "Do you have any medical problems that can cause easy bruising or bleeding?" (e.g., leukemia, liver disease, recent chemotherapy)    Denies all 8. MEDICATIONS : "Do you take any medications which thin the blood such as: aspirin, heparin, ibuprofen (NSAIDS), Plavix, or Coumadin?"     Aspirin and plavix  9. OTHER SYMPTOMS: "Do you have any other symptoms?"  (e.g., weakness, dizziness, pain, fever, nosebleed, blood in urine/stool)     Denies all, knot in bruise on upper left arm 10. PREGNANCY: "Is there any chance you are pregnant?" "When was your last menstrual period?"       N/A  Protocols used: BRUISES-A-AH   Patient states she is taking aspirin and Plavix and bruises very easily.  She states she has numerous bruises all over her body, and she does not know how she gets a lot of them.  States it can be something as simple as leaning against the counter and she gets a bruise on her hip.  She is concerned about a bruise on her upper left arm that has a knot in it the size of an acorn.  She does not know exactly how she got the bruise, it has been there for about 1 week.  The bruise is fading, but knot persists, it is painful to touch.  Recommended patient be seen at PCP office for evaluation of painful knot and bruising.  Patient scheduled for today at 2:20 pm.

## 2019-04-23 NOTE — Progress Notes (Signed)
Subjective  CC:  Chief Complaint  Patient presents with  . Bruises    She started Plavix, has been having alot bruises.. Left arm had a place that  has a acorn size lump   Same day acute visit; PCP not available. New pt to me. Chart reviewed.   HPI: Sheila Shields is a 84 y.o. female who presents to the office today to address the problems listed above in the chief complaint.  84 yo on plavix and aspirin with easy bruising has healing bruise on upper inner left arm and noticed a minimally tender knot. Wants to be sure it is ok. No pain. No weakness.   Assessment  1. Ecchymosis      Plan   Easy bruising in pt on asa/plavix:  Reassured. Healing bruise with small nontender hematoma. Recommend being cautious.  Follow up: No follow-ups on file.  05/22/2019  No orders of the defined types were placed in this encounter.  No orders of the defined types were placed in this encounter.     I reviewed the patients updated PMH, FH, and SocHx.    Patient Active Problem List   Diagnosis Date Noted  . Brain aneurysm 12/30/2018  . Aortic atherosclerosis ([place]) 04/11/2018  . Allergic rhinitis 11/09/2017  . Arm mass, left 04/16/2017  . Chronic cough 10/11/2015  . COPD (chronic obstructive pulmonary disease) ([place]) 11/18/2014  . Iron deficiency anemia, unspecified 01/20/2011  . Arthropathy 02/23/2010  . HYPERGLYCEMIA 08/27/2007  . Gastroesophageal reflux disease 05/01/2007  . Hyperlipemia 12/17/2006  . Essential hypertension 12/17/2006   Current Meds  Medication Sig  . acetaminophen ([employer] ARTHRITIS PAIN RELIEF) 650 MG CR tablet Take 1,300 mg by mouth 2 (two) times daily.  Marland Kitchen albuterol (PROAIR HFA) 108 (90 Base) MCG/ACT inhaler INHALE 2 PUFFS EVERY 4 HOURS AS NEEDED FOR COUGHING SPELLS (Patient taking differently: Inhale 2 puffs into the lungs every 4 (four) hours as needed (coughing spells). )  . aspirin 325 MG tablet Take 1 tablet (325 mg total) by mouth daily.  . clopidogrel (PLAVIX)  75 MG tablet TAKE ONE TABLET BY MOUTH DAILY  . Coenzyme Q10 (COQ10 PO) Take 1 capsule by mouth daily.   . ferrous sulfate 325 (65 FE) MG tablet TAKE 1 TABLET BY MOUTH EVERY DAY (Patient taking differently: Take 325 mg by mouth daily with breakfast. )  . fexofenadine (ALLEGRA) 180 MG tablet Take 180 mg by mouth daily.  . fluticasone (FLONASE) 50 MCG/ACT nasal spray Place 2 sprays into both nostrils daily.  . hydrochlorothiazide (HYDRODIURIL) 25 MG tablet Take 1 tablet (25 mg total) by mouth daily.  . hydrocortisone 2.5 % cream Apply 1 application topically 2 (two) times daily as needed. Inflammation/arthritis  . meclizine (ANTIVERT) 25 MG tablet Take 1 tablet (25 mg total) by mouth 3 (three) times daily as needed for dizziness.  . Multiple Vitamin (MULTIVITAMIN WITH MINERALS) TABS tablet Take 1 tablet by mouth daily.  . pantoprazole (PROTONIX) 40 MG tablet TAKE ONE TABLET BY MOUTH DAILY  . potassium chloride SA (K-DUR) 20 MEQ tablet Take 1 tablet (20 mEq total) by mouth daily.  Marland Kitchen Propylene Glycol (SYSTANE BALANCE OP) Place 1 drop into both eyes every 6 (six) hours as needed (dry eyes).  . simvastatin (ZOCOR) 40 MG tablet Take 1 tablet (40 mg total) by mouth daily.  . traMADol (ULTRAM) 50 MG tablet Take 50 mg by mouth every 6 (six) hours as needed (pain).  . Vitamin D, Cholecalciferol, 1000 units CAPS Take  1,000 Units by mouth daily.     Allergies: Patient is allergic to alcohol; codeine; and dilaudid [hydromorphone hcl]. Family History: Patient family history includes Cancer in her brother; Heart attack (age of onset: 61) in her father; Heart disease in her brother; Idiopathic pulmonary fibrosis (age of onset: 34) in her brother; Throat cancer in her mother. Social History:  Patient  reports that she quit smoking about 34 years ago. Her smoking use included cigarettes. She has a 60.00 pack-year smoking history. She has never used smokeless tobacco. She reports that she does not drink alcohol or  use drugs.  Review of Systems: Constitutional: Negative for fever malaise or anorexia Cardiovascular: negative for chest pain Respiratory: negative for SOB or persistent cough Gastrointestinal: negative for abdominal pain  Objective  Vitals: BP 134/84   Pulse 82   Temp 97.8 F (36.6 C) (Tympanic)   Resp 18   Ht 5\' 6"  (1.676 m)   Wt 197 lb (89.4 kg)   SpO2 91%   BMI 31.80 kg/m  General: no acute distress , A&Ox3 Skin: left inner upper arm: healing multicolored bruise with 1cm minmally tender nodule in the center of it     Commons side effects, risks, benefits, and alternatives for medications and treatment plan prescribed today were discussed, and the patient expressed understanding of the given instructions. Patient is instructed to call or message via MyChart if he/she has any questions or concerns regarding our treatment plan. No barriers to understanding were identified. We discussed Red Flag symptoms and signs in detail. Patient expressed understanding regarding what to do in case of urgent or emergency type symptoms.   Medication list was reconciled, printed and provided to the patient in AVS. Patient instructions and summary information was reviewed with the patient as documented in the AVS. This note was prepared with assistance of Dragon voice recognition software. Occasional wrong-word or sound-a-like substitutions may have occurred due to the inherent limitations of voice recognition software

## 2019-04-24 ENCOUNTER — Ambulatory Visit: Payer: Medicare HMO | Admitting: Family Medicine

## 2019-05-19 ENCOUNTER — Other Ambulatory Visit: Payer: Self-pay | Admitting: Neurology

## 2019-05-19 NOTE — Telephone Encounter (Signed)
Dr. Tomi Likens  Are you still wanting to manage this pt's Simvastatin medication?

## 2019-05-22 ENCOUNTER — Other Ambulatory Visit: Payer: Self-pay

## 2019-05-22 ENCOUNTER — Ambulatory Visit (INDEPENDENT_AMBULATORY_CARE_PROVIDER_SITE_OTHER): Payer: Medicare HMO | Admitting: Family Medicine

## 2019-05-22 ENCOUNTER — Encounter: Payer: Self-pay | Admitting: Family Medicine

## 2019-05-22 VITALS — BP 136/78 | HR 68 | Temp 98.6°F | Ht 66.0 in | Wt 200.0 lb

## 2019-05-22 DIAGNOSIS — I10 Essential (primary) hypertension: Secondary | ICD-10-CM

## 2019-05-22 DIAGNOSIS — R05 Cough: Secondary | ICD-10-CM

## 2019-05-22 DIAGNOSIS — M129 Arthropathy, unspecified: Secondary | ICD-10-CM | POA: Diagnosis not present

## 2019-05-22 DIAGNOSIS — D509 Iron deficiency anemia, unspecified: Secondary | ICD-10-CM | POA: Diagnosis not present

## 2019-05-22 DIAGNOSIS — Z87891 Personal history of nicotine dependence: Secondary | ICD-10-CM

## 2019-05-22 DIAGNOSIS — R053 Chronic cough: Secondary | ICD-10-CM

## 2019-05-22 DIAGNOSIS — E782 Mixed hyperlipidemia: Secondary | ICD-10-CM

## 2019-05-22 DIAGNOSIS — I671 Cerebral aneurysm, nonruptured: Secondary | ICD-10-CM

## 2019-05-22 DIAGNOSIS — I700 Atherosclerosis of aorta: Secondary | ICD-10-CM | POA: Diagnosis not present

## 2019-05-22 DIAGNOSIS — K219 Gastro-esophageal reflux disease without esophagitis: Secondary | ICD-10-CM | POA: Diagnosis not present

## 2019-05-22 DIAGNOSIS — R2232 Localized swelling, mass and lump, left upper limb: Secondary | ICD-10-CM

## 2019-05-22 DIAGNOSIS — Z79899 Other long term (current) drug therapy: Secondary | ICD-10-CM | POA: Diagnosis not present

## 2019-05-22 DIAGNOSIS — R7309 Other abnormal glucose: Secondary | ICD-10-CM

## 2019-05-22 DIAGNOSIS — F1021 Alcohol dependence, in remission: Secondary | ICD-10-CM

## 2019-05-22 DIAGNOSIS — J410 Simple chronic bronchitis: Secondary | ICD-10-CM | POA: Diagnosis not present

## 2019-05-22 LAB — CBC WITH DIFFERENTIAL/PLATELET
Basophils Absolute: 0.1 10*3/uL (ref 0.0–0.1)
Basophils Relative: 1.3 % (ref 0.0–3.0)
Eosinophils Absolute: 0.5 10*3/uL (ref 0.0–0.7)
Eosinophils Relative: 5.9 % — ABNORMAL HIGH (ref 0.0–5.0)
HCT: 42.8 % (ref 36.0–46.0)
Hemoglobin: 14.1 g/dL (ref 12.0–15.0)
Lymphocytes Relative: 18.4 % (ref 12.0–46.0)
Lymphs Abs: 1.6 10*3/uL (ref 0.7–4.0)
MCHC: 32.9 g/dL (ref 30.0–36.0)
MCV: 92.6 fl (ref 78.0–100.0)
Monocytes Absolute: 1.2 10*3/uL — ABNORMAL HIGH (ref 0.1–1.0)
Monocytes Relative: 13 % — ABNORMAL HIGH (ref 3.0–12.0)
Neutro Abs: 5.4 10*3/uL (ref 1.4–7.7)
Neutrophils Relative %: 61.4 % (ref 43.0–77.0)
Platelets: 318 10*3/uL (ref 150.0–400.0)
RBC: 4.62 Mil/uL (ref 3.87–5.11)
RDW: 13.7 % (ref 11.5–15.5)
WBC: 8.8 10*3/uL (ref 4.0–10.5)

## 2019-05-22 LAB — LIPID PANEL
Cholesterol: 201 mg/dL — ABNORMAL HIGH (ref 0–200)
HDL: 50.1 mg/dL (ref >39.00)
NonHDL: 151.2
Total CHOL/HDL Ratio: 4
Triglycerides: 253 mg/dL — ABNORMAL HIGH (ref 0.0–149.0)
VLDL: 50.6 mg/dL — ABNORMAL HIGH (ref 0.0–40.0)

## 2019-05-22 LAB — HEMOGLOBIN A1C: Hgb A1c MFr Bld: 5.8 % (ref 4.6–6.5)

## 2019-05-22 LAB — LDL CHOLESTEROL, DIRECT: Direct LDL: 105 mg/dL

## 2019-05-22 LAB — COMPREHENSIVE METABOLIC PANEL
ALT: 14 U/L (ref 0–35)
AST: 17 U/L (ref 0–37)
Albumin: 4.5 g/dL (ref 3.5–5.2)
Alkaline Phosphatase: 76 U/L (ref 39–117)
BUN: 21 mg/dL (ref 6–23)
CO2: 29 mEq/L (ref 19–32)
Calcium: 9.9 mg/dL (ref 8.4–10.5)
Chloride: 103 mEq/L (ref 96–112)
Creatinine, Ser: 0.82 mg/dL (ref 0.40–1.20)
GFR: 66.69 mL/min (ref >60.00)
Glucose, Bld: 93 mg/dL (ref 70–99)
Potassium: 3.8 mEq/L (ref 3.5–5.1)
Sodium: 142 mEq/L (ref 135–145)
Total Bilirubin: 0.5 mg/dL (ref 0.2–1.2)
Total Protein: 6.9 g/dL (ref 6.0–8.3)

## 2019-05-22 LAB — VITAMIN B12: Vitamin B-12: 337 pg/mL (ref 211–911)

## 2019-05-22 MED ORDER — POTASSIUM CHLORIDE CRYS ER 20 MEQ PO TBCR: 20.0000 meq | EXTENDED_RELEASE_TABLET | Freq: Every day | ORAL | 3 refills | Status: DC

## 2019-05-22 MED ORDER — HYDROCHLOROTHIAZIDE 25 MG PO TABS: 25.0000 mg | ORAL_TABLET | Freq: Every day | ORAL | 3 refills | Status: DC

## 2019-05-22 MED ORDER — PANTOPRAZOLE SODIUM 20 MG PO TBEC: 20.0000 mg | DELAYED_RELEASE_TABLET | Freq: Every day | ORAL | 3 refills | Status: DC

## 2019-05-22 NOTE — Assessment & Plan Note (Signed)
S: compliant with simvastatin 40 mg- had increased from 20mg .  LDL at least under 100 but triglycerides slightly high-attempts have been made to increase her up to atorvastatin but she had myalgias Lab Results  Component Value Date   CHOL 178 01/20/2019   HDL 47.60 01/20/2019   LDLCALC 98 08/08/2018   LDLDIRECT 94.0 01/20/2019   TRIG 236.0 (H) 01/20/2019   CHOLHDL 4 01/20/2019   A/P: hopefully LDL under 70- update lipid panel. Consider zetia as add on potentially.

## 2019-05-22 NOTE — Progress Notes (Signed)
Phone: [phone]   Subjective:  Patient presents today to establish care with me as their new primary care provider. Patient was formerly a patient of Dr. Juleen China.  Chief Complaint  Patient presents with  . Transitions Of Care    from Dr. Juleen China     See problem oriented charting ROS-Review of Systems  Constitutional: Negative.   HENT: Negative.   Eyes: Negative.   Respiratory: Positive for cough.        History of cough doing better right now   Cardiovascular: Negative.   Gastrointestinal: Negative.   Genitourinary: Negative.   Musculoskeletal: Negative.   Skin: Negative.   Neurological: Positive for headaches.  Psychiatric/Behavioral: The patient has insomnia.        Ongoing      The following were reviewed and entered/updated in epic: Past Medical History:  Diagnosis Date  . Alcoholism in remission ([place]) 08/27/2007  . Anemia   . Arthritis    OA  . COPD 12/17/2006  . Diverticulosis 03/27/2007  . GERD 05/01/2007  . HYPERGLYCEMIA 08/27/2007  . HYPERLIPIDEMIA 12/17/2006  . HYPERTENSION 12/17/2006  . Mood disorder ([facility])    hx anxiety and depression. meds short term during life transition  . Pneumonia    AS A CHILD  . Rectal polyp 03/27/2007   adenoma  . TIA (transient ischemic attack)    Patient Active Problem List   Diagnosis Date Noted  . Brain aneurysm 12/30/2018    Priority: High  . Chronic cough 10/11/2015    Priority: High  . COPD (chronic obstructive pulmonary disease) ([place]) 11/18/2014    Priority: High  . HYPERGLYCEMIA 08/27/2007    Priority: Medium  . Gastroesophageal reflux disease 05/01/2007    Priority: Medium  . Hyperlipemia 12/17/2006    Priority: Medium  . Essential hypertension 12/17/2006    Priority: Medium  . Former smoker 05/22/2019    Priority: Low  . Aortic atherosclerosis ([place]) 04/11/2018    Priority: Low  . Allergic rhinitis 11/09/2017    Priority: Low  . Arm mass, left 04/16/2017    Priority: Low  . Iron deficiency anemia,  unspecified 01/20/2011    Priority: Low  . Arthropathy 02/23/2010    Priority: Low  . Alcoholism in remission ([place]) 08/27/2007    Priority: Low   Past Surgical History:  Procedure Laterality Date  . ABDOMINAL HYSTERECTOMY  1978   fibroma  . APPENDECTOMY  2002  . BREAST EXCISIONAL BIOPSY Right 2003   negative  . CARPAL TUNNEL RELEASE Left 2010 or 2011  . COLONOSCOPY W/ POLYPECTOMY  03/27/2007; n7/19/2012   2008: 4 mm adenoma, diverticulosis 2012: ileitis ? NSAID - likely, 2-3 mm cecal polyp LYMPHOID FOLLICLE, diverticulosis  . ESOPHAGOGASTRODUODENOSCOPY  01/26/2011   reflux esophagitis, colonscopy done also  . HAMMER TOE SURGERY  2008   left foot  . HERNIA REPAIR Right 1996?  . IR ANGIO INTRA EXTRACRAN SEL COM CAROTID INNOMINATE UNI R MOD SED  11/28/2018  . IR ANGIO INTRA EXTRACRAN SEL INTERNAL CAROTID UNI R MOD SED  12/30/2018  . IR ANGIO VERTEBRAL SEL SUBCLAVIAN INNOMINATE UNI R MOD SED  11/28/2018  . IR ANGIOGRAM FOLLOW UP STUDY  12/30/2018  . IR TRANSCATH/EMBOLIZ  12/30/2018  . IR US GUIDE VASC ACCESS RIGHT  11/28/2018  . ORIF ANKLE FRACTURE Right 12/22/2014   Procedure: OPEN REDUCTION INTERNAL FIXATION (ORIF) ANKLE FRACTURE;  Surgeon: Susa Day, MD;  Location: [facility];  Service: Orthopedics;  Laterality: Right;  . RADIOLOGY WITH ANESTHESIA N/A 12/30/2018  Procedure: RADIOLOGY WITH ANESTHESIA;  Surgeon: Luanne Bras, MD;  Location: [facility];  Service: Radiology;  Laterality: N/A;  . SHOULDER OPEN ROTATOR CUFF REPAIR Left 03/06/2013   Procedure: LEFT ROTATOR CUFF REPAIR, SUBACROMIAL DECOMPRESSION, PATCH GRAFT, MANIPULATION UNDER ANESTHESIA;  Surgeon: Johnn Hai, MD;  Location: [facility];  Service: Orthopedics;  Laterality: Left;    Family History  Problem Relation Age of Onset  . Throat cancer Mother        27  . Heart attack Father 31  . Idiopathic pulmonary fibrosis Brother 74  . Cancer Brother        sarcoma  . Heart disease Brother   . Colon cancer Neg Hx   .  Esophageal cancer Neg Hx   . Stomach cancer Neg Hx     Medications- reviewed and updated Current Outpatient Medications  Medication Sig Dispense Refill  . acetaminophen (CVS ARTHRITIS PAIN RELIEF) 650 MG CR tablet Take 1,300 mg by mouth 2 (two) times daily.    Marland Kitchen albuterol (PROAIR HFA) 108 (90 Base) MCG/ACT inhaler INHALE 2 PUFFS EVERY 4 HOURS AS NEEDED FOR COUGHING SPELLS (Patient taking differently: Inhale 2 puffs into the lungs every 4 (four) hours as needed (coughing spells). ) 8.5 Inhaler 2  . aspirin 325 MG tablet Take 1 tablet (325 mg total) by mouth daily. 30 tablet 3  . clopidogrel (PLAVIX) 75 MG tablet TAKE ONE TABLET BY MOUTH DAILY 90 tablet 2  . Coenzyme Q10 (COQ10 PO) Take 1 capsule by mouth daily.     . ferrous sulfate 325 (65 FE) MG tablet TAKE 1 TABLET BY MOUTH EVERY DAY (Patient taking differently: Take 325 mg by mouth daily with breakfast. ) 90 tablet 3  . fexofenadine (ALLEGRA) 180 MG tablet Take 180 mg by mouth daily.    . fluticasone (FLONASE) 50 MCG/ACT nasal spray Place 2 sprays into both nostrils daily. 16 g 2  . hydrochlorothiazide (HYDRODIURIL) 25 MG tablet Take 1 tablet (25 mg total) by mouth daily. 90 tablet 3  . hydrocortisone 2.5 % cream Apply 1 application topically 2 (two) times daily as needed. Inflammation/arthritis    . Multiple Vitamin (MULTIVITAMIN WITH MINERALS) TABS tablet Take 1 tablet by mouth daily.    . potassium chloride SA (KLOR-CON) 20 MEQ tablet Take 1 tablet (20 mEq total) by mouth daily. 90 tablet 3  . Propylene Glycol (SYSTANE BALANCE OP) Place 1 drop into both eyes every 6 (six) hours as needed (dry eyes).    . simvastatin (ZOCOR) 40 MG tablet TAKE ONE TABLET BY MOUTH DAILY 90 tablet 1  . Vitamin D, Cholecalciferol, 1000 units CAPS Take 1,000 Units by mouth daily.     . diclofenac Sodium (VOLTAREN) 1 % GEL diclofenac 1 % topical gel    . pantoprazole (PROTONIX) 20 MG tablet Take 1 tablet (20 mg total) by mouth daily. 90 tablet 3   No current  facility-administered medications for this visit.     Allergies-reviewed and updated Allergies  Allergen Reactions  . Alcohol Other (See Comments)    Recovering Alcoholic*  . Codeine     Patient prefers not to take this due to history of alcoholism  . Dilaudid [Hydromorphone Hcl]     Pt had hypotension after dilaudid 1mg  IV while in the OR, required temporary pressor intervention.    Social History   Social History Narrative   Home Situation: lives with husband. 2 daughters, 2 step daughters, 6 grandkids. 2 greatgrandkids    Work or School: very  active in [employer] and church      Spiritual Beliefs: Darrick Meigs      Lifestyle:tries to walk; diet ok - denies any alcohol or tobacco use in many many years      Caffeine 1 cup coffee/day      Hobbies: news junkie, audio books   Objective  Objective:  BP 136/78   Pulse 68   Temp 98.6 F (37 C) (Temporal)   Ht 5\' 6"  (1.676 m)   Wt 200 lb (90.7 kg)   SpO2 92%   BMI 32.28 kg/m  Gen: NAD, resting comfortably HEENT: Mucous membranes are moist. Oropharynx normal Neck: no thyromegaly CV: RRR no murmurs rubs or gallops Lungs: CTAB no crackles, wheeze, rhonchi Abdomen: soft/nontender/nondistended/normal bowel sounds. No rebound or guarding.  Ext: 1+ edema Skin: warm, dry Neuro: grossly normal, moves all extremities, PERRLA   Assessment and Plan:   #hypertension S: compliant with hydrochlorothiazide 25 mg.  She also has to take potassium BP Readings from Last 3 Encounters:  05/22/19 136/78  04/23/19 134/84  04/04/19 128/72  A/P: Stable. Continue current medications.   #hyperlipidemia S: compliant with simvastatin 40 mg- had increased from 20mg .  LDL at least under 100 but triglycerides slightly high-attempts have been made to increase her up to atorvastatin but she had myalgias Lab Results  Component Value Date   CHOL 178 01/20/2019   HDL 47.60 01/20/2019   LDLCALC 98 08/08/2018   LDLDIRECT 94.0 01/20/2019   TRIG 236.0 (H)  01/20/2019   CHOLHDL 4 01/20/2019   A/P: hopefully LDL under 70- update lipid panel. Consider zetia as add on potentially.    #COPD- Dr. Lamonte Sakai pulmonologist S:albuterol prn. Has tried other inhalers without significant relief- ongoing cough does not resolve with these.  Tessalon does not help A/P: stable with chronic cough- no significant shortness of breath other than with really pushing herself.    #History of possible TIA S:10/21/2018 presented with left facial numbness after several weeks of increased ocular migraines on left. Extensive eval was done including neuro eval, IR eval, MRI= aneurysm was found. Patient with interventional radiology attempted intervention with endovascular embolization (not able to be coiled) of intracranial aneurysm of the right ICA in June 2022.   Had normal EEG. REpeat MRI planned next week. Due to possible prior TIA- she remains on aspirin and plavix- we will leave these on board unless neurology says otherwise.  A/P: no change today- continue close follow up with IR and neurology    #GERD S: Compliant with Protonix 40 mg  Lab Results  Component Value Date   VITAMINB12 402 01/20/2016   A/P: well controlled but no recent trial of lower dose- we will trial 20mg . Also check b12 with long term PPI use  # Hyperglycemia/insulin resistance/prediabetes S: Exercise and diet- has lost some weight from 2018 and a1c improved. No regular exercise.  - has some aerobic chair exercise she can do  Lab Results  Component Value Date   HGBA1C 6.0 11/14/2018   HGBA1C 5.9 08/21/2017   HGBA1C 6.4 02/22/2017   A/P: hopefully remains controlled- update a1c. Could consider metformin if gets closer to 6.4 again with interventions she is going to try to start back chair exercise.s   #Callous right great toe base- offered podiatry referral and she declined  Recommended follow up: 6 month follow up  Future Appointments  Date Time Provider [place]  05/27/2019  1:00 PM  [facility]  05/27/2019  1:45 PM [facility] 3 [facility]  [facility]  08/26/2019 10:10 AM Jaffe, Adam R, DO [facility] None   Lab/Order associations: not fasting   ICD-10-CM   1. High risk medication use  Z79.899 Vitamin B12  2. Simple chronic bronchitis (HCC)  J41.0   3. Mixed hyperlipidemia  E78.2 CBC with Differential/Platelet    Comprehensive metabolic panel    Lipid panel  4. Iron deficiency anemia, unspecified iron deficiency anemia type  D50.9   5. Gastroesophageal reflux disease, unspecified whether esophagitis present  K21.9   6. HYPERGLYCEMIA  R73.09 Hemoglobin A1c  7. Chronic cough  R05   8. Aortic atherosclerosis (HCC)  I70.0   9. Arm mass, left  R22.32   10. Arthropathy  M12.9   11. Essential hypertension  I10   12. Brain aneurysm  I67.1   13. Alcoholism in remission ([place])  F10.21   14. Former smoker  Z87.891     Meds ordered this encounter  Medications  . hydrochlorothiazide (HYDRODIURIL) 25 MG tablet    Sig: Take 1 tablet (25 mg total) by mouth daily.    Dispense:  90 tablet    Refill:  3  . potassium chloride SA (KLOR-CON) 20 MEQ tablet    Sig: Take 1 tablet (20 mEq total) by mouth daily.    Dispense:  90 tablet    Refill:  3  . pantoprazole (PROTONIX) 20 MG tablet    Sig: Take 1 tablet (20 mg total) by mouth daily.    Dispense:  90 tablet    Refill:  3    Return precautions advised.  Garret Reddish, MD

## 2019-05-22 NOTE — Assessment & Plan Note (Signed)
Incidental finding on x-ray. Will continue to control risk factors for progressoin

## 2019-05-22 NOTE — Patient Instructions (Addendum)
  Recommended follow up: 6 month follow up   Only change today- try 20mg  for reflux medicine instead of 40mg - I sent this in for you  Please stop by lab before you go If you do not have mychart- we will call you about results within 5 business days of [place] receiving them.  If you have mychart- we will send your results within 3 business days of [place] receiving them.  If abnormal or we want to clarify a result, we will call or mychart you to make sure you receive the message.  If you have questions or concerns or don't hear within 5-7 days, please send [place] a message or call us.

## 2019-05-22 NOTE — Assessment & Plan Note (Signed)
S: Exercise and diet- has lost some weight from 2018 and a1c improved. No regular exercise.  - has some aerobic chair exercise she can do  Lab Results  Component Value Date   HGBA1C 6.0 11/14/2018   HGBA1C 5.9 08/21/2017   HGBA1C 6.4 02/22/2017   A/P: hopefully remains controlled- update a1c. Could consider metformin if gets closer to 6.4 again with interventions she is going to try to start back chair exercise.s

## 2019-05-22 NOTE — Assessment & Plan Note (Signed)
S: compliant with hydrochlorothiazide 25 mg.  She also has to take potassium BP Readings from Last 3 Encounters:  05/22/19 (!) 146/82  04/23/19 134/84  04/04/19 128/72  A/P: Stable. Continue current medications.

## 2019-05-22 NOTE — Assessment & Plan Note (Signed)
#  History of possible TIA S:10/21/2018 presented with left facial numbness after several weeks of increased ocular migraines on left. Extensive eval was done including neuro eval, IR eval, MRI= aneurysm was found. Patient with interventional radiology attempted intervention with endovascular embolization (not able to be coiled) of intracranial aneurysm of the right ICA in June 2022.   Had normal EEG. REpeat MRI planned next week. Due to possible prior TIA- she remains on aspirin and plavix- we will leave these on board unless neurology says otherwise.  A/P: no change today- continue close follow up with IR and neurology

## 2019-05-22 NOTE — Assessment & Plan Note (Signed)
Congratulated patient. stable- 37 years in remission as of 2020

## 2019-05-22 NOTE — Assessment & Plan Note (Signed)
S:albuterol prn. Has tried other inhalers without significant relief- ongoing cough does not resolve with these.  Tessalon does not help A/P: stable with chronic cough- no significant shortness of breath other than with really pushing herself.

## 2019-05-22 NOTE — Assessment & Plan Note (Signed)
S: Compliant with Protonix 40 mg  Lab Results  Component Value Date   PBAQVOHC09 198 01/20/2016   A/P: well controlled but no recent trial of lower dose- we will trial 20mg . Also check b12 with long term PPI use

## 2019-05-22 NOTE — Assessment & Plan Note (Signed)
Multiple joints- 650 tylenol arthritis x2 twice a day

## 2019-05-26 DIAGNOSIS — J300 Vasomotor rhinitis: Secondary | ICD-10-CM | POA: Diagnosis not present

## 2019-05-26 DIAGNOSIS — R05 Cough: Secondary | ICD-10-CM | POA: Diagnosis not present

## 2019-05-27 ENCOUNTER — Ambulatory Visit (HOSPITAL_COMMUNITY): Admission: RE | Admit: 2019-05-27 | Payer: Medicare HMO | Source: Ambulatory Visit

## 2019-05-27 ENCOUNTER — Encounter (HOSPITAL_COMMUNITY): Payer: Self-pay

## 2019-05-27 ENCOUNTER — Ambulatory Visit (HOSPITAL_COMMUNITY)
Admission: RE | Admit: 2019-05-27 | Discharge: 2019-05-27 | Disposition: A | Discharge status: Home / Self Care | Payer: Medicare HMO | Source: Ambulatory Visit | Attending: Interventional Radiology | Admitting: Interventional Radiology

## 2019-05-27 ENCOUNTER — Other Ambulatory Visit: Payer: Self-pay

## 2019-05-27 DIAGNOSIS — G454 Transient global amnesia: Secondary | ICD-10-CM | POA: Diagnosis present

## 2019-05-27 DIAGNOSIS — G43109 Migraine with aura, not intractable, without status migrainosus: Secondary | ICD-10-CM | POA: Diagnosis present

## 2019-05-27 DIAGNOSIS — E785 Hyperlipidemia, unspecified: Secondary | ICD-10-CM | POA: Diagnosis present

## 2019-05-27 DIAGNOSIS — G459 Transient cerebral ischemic attack, unspecified: Secondary | ICD-10-CM

## 2019-05-27 DIAGNOSIS — I671 Cerebral aneurysm, nonruptured: Secondary | ICD-10-CM | POA: Diagnosis not present

## 2019-05-27 DIAGNOSIS — I10 Essential (primary) hypertension: Secondary | ICD-10-CM | POA: Diagnosis present

## 2019-05-27 DIAGNOSIS — R6889 Other general symptoms and signs: Secondary | ICD-10-CM

## 2019-05-31 ENCOUNTER — Other Ambulatory Visit: Payer: Self-pay | Admitting: Gastroenterology

## 2019-06-03 ENCOUNTER — Telehealth (HOSPITAL_COMMUNITY): Payer: Self-pay

## 2019-06-03 NOTE — Telephone Encounter (Signed)
Pt agreed to f/u in 6 months with a MRA head w/o. She had questions regarding her Aspirin. She wants to know if she can discontinue due to her bruising. I have sent a message to our PA for recommendations and will get back with her. Pt agreed with plan. AW

## 2019-06-26 DIAGNOSIS — R69 Illness, unspecified: Secondary | ICD-10-CM | POA: Diagnosis not present

## 2019-07-01 ENCOUNTER — Encounter: Payer: Self-pay | Admitting: Family Medicine

## 2019-07-18 ENCOUNTER — Encounter: Payer: Self-pay | Admitting: Family Medicine

## 2019-07-27 ENCOUNTER — Ambulatory Visit: Payer: Medicare Other | Attending: Internal Medicine

## 2019-07-27 DIAGNOSIS — Z23 Encounter for immunization: Secondary | ICD-10-CM | POA: Insufficient documentation

## 2019-07-27 NOTE — Progress Notes (Signed)
   Covid-19 Vaccination Clinic  Name:  Sheila Shields    MRN: [id number] DOB: Dec 05, 1934  07/27/2019  Sheila Shields was observed post Covid-19 immunization for 15 minutes without incidence. She was provided with Vaccine Information Sheet and instruction to access the V-Safe system.   Sheila Shields was instructed to call 911 with any severe reactions post vaccine: Marland Kitchen Difficulty breathing  . Swelling of your face and throat  . A fast heartbeat  . A bad rash all over your body  . Dizziness and weakness

## 2019-08-11 ENCOUNTER — Encounter: Payer: Self-pay | Admitting: Family Medicine

## 2019-08-11 ENCOUNTER — Ambulatory Visit (INDEPENDENT_AMBULATORY_CARE_PROVIDER_SITE_OTHER): Payer: Medicare HMO | Admitting: Family Medicine

## 2019-08-11 VITALS — Temp 97.3°F

## 2019-08-11 DIAGNOSIS — J441 Chronic obstructive pulmonary disease with (acute) exacerbation: Secondary | ICD-10-CM

## 2019-08-11 MED ORDER — PREDNISONE 20 MG PO TABS: ORAL_TABLET | ORAL | 0 refills | Status: DC

## 2019-08-11 NOTE — Patient Instructions (Signed)
There are no preventive care reminders to display for this patient. Depression screen [facility] 2/9 04/04/2019 02/26/2018 02/22/2017  Decreased Interest 0 0 0  Down, Depressed, Hopeless 0 0 0  PHQ - 2 Score 0 0 0  Some recent data might be hidden

## 2019-08-11 NOTE — Progress Notes (Signed)
Phone [phone] Virtual visit via Video note   Subjective:  Chief complaint: Chief Complaint  Patient presents with  . virtual  . SOB and low oxygen   This visit type was conducted due to national recommendations for restrictions regarding the COVID-19 Pandemic (e.g. social distancing).  This format is felt to be most appropriate for this patient at this time balancing risks to patient and risks to population by having him in for in person visit.  No physical exam was performed (except for noted visual exam or audio findings with Telehealth visits).    Our team/I connected with [name] at  4:40 PM EST by a video enabled telemedicine application (doxy.me or caregility through epic) and verified that I am speaking with the correct person using two identifiers.  Location patient: Home-[place] Location provider: [facility], office Persons participating in the virtual visit:  patient  Our team/I discussed the limitations of evaluation and management by telemedicine and the availability of in person appointments. In light of current covid-19 pandemic, patient also understands that we are trying to protect them by minimizing in office contact if at all possible.  The patient expressed consent for telemedicine visit and agreed to proceed. Patient understands insurance will be billed.   Past Medical History-  Patient Active Problem List   Diagnosis Date Noted  . Brain aneurysm 12/30/2018    Priority: High  . Chronic cough 10/11/2015    Priority: High  . COPD (chronic obstructive pulmonary disease) ([place]) 11/18/2014    Priority: High  . HYPERGLYCEMIA 08/27/2007    Priority: Medium  . Gastroesophageal reflux disease 05/01/2007    Priority: Medium  . Hyperlipemia 12/17/2006    Priority: Medium  . Essential hypertension 12/17/2006    Priority: Medium  . Former smoker 05/22/2019    Priority: Low  . Aortic atherosclerosis ([place]) 04/11/2018    Priority: Low  . Allergic rhinitis  11/09/2017    Priority: Low  . Arm mass, left 04/16/2017    Priority: Low  . Iron deficiency anemia, unspecified 01/20/2011    Priority: Low  . Arthropathy 02/23/2010    Priority: Low  . Alcoholism in remission ([place]) 08/27/2007    Priority: Low    Medications- reviewed and updated Current Outpatient Medications  Medication Sig Dispense Refill  . acetaminophen ([employer] ARTHRITIS PAIN RELIEF) 650 MG CR tablet Take 1,300 mg by mouth 2 (two) times daily.    Marland Kitchen albuterol (PROAIR HFA) 108 (90 Base) MCG/ACT inhaler INHALE 2 PUFFS EVERY 4 HOURS AS NEEDED FOR COUGHING SPELLS (Patient taking differently: Inhale 2 puffs into the lungs every 4 (four) hours as needed (coughing spells). ) 8.5 Inhaler 2  . aspirin 325 MG tablet Take 1 tablet (325 mg total) by mouth daily. 30 tablet 3  . clopidogrel (PLAVIX) 75 MG tablet TAKE ONE TABLET BY MOUTH DAILY 90 tablet 2  . Coenzyme Q10 (COQ10 PO) Take 1 capsule by mouth daily.     . diclofenac Sodium (VOLTAREN) 1 % GEL diclofenac 1 % topical gel    . ferrous sulfate 325 (65 FE) MG tablet TAKE 1 TABLET BY MOUTH EVERY DAY (Patient taking differently: Take 325 mg by mouth daily with breakfast. ) 90 tablet 3  . fexofenadine (ALLEGRA) 180 MG tablet Take 180 mg by mouth daily.    . fluticasone (FLONASE) 50 MCG/ACT nasal spray Place 2 sprays into both nostrils daily. 16 g 2  . hydrochlorothiazide (HYDRODIURIL) 25 MG tablet Take 1 tablet (25 mg total) by mouth  daily. 90 tablet 3  . hydrocortisone 2.5 % cream Apply 1 application topically 2 (two) times daily as needed. Inflammation/arthritis    . Multiple Vitamin (MULTIVITAMIN WITH MINERALS) TABS tablet Take 1 tablet by mouth daily.    . pantoprazole (PROTONIX) 20 MG tablet Take 1 tablet (20 mg total) by mouth daily. 90 tablet 3  . potassium chloride SA (KLOR-CON) 20 MEQ tablet Take 1 tablet (20 mEq total) by mouth daily. 90 tablet 3  . Propylene Glycol (SYSTANE BALANCE OP) Place 1 drop into both eyes every 6 (six) hours as  needed (dry eyes).    . simvastatin (ZOCOR) 40 MG tablet TAKE ONE TABLET BY MOUTH DAILY 90 tablet 1  . Vitamin D, Cholecalciferol, 1000 units CAPS Take 1,000 Units by mouth daily.     . predniSONE (DELTASONE) 20 MG tablet Take 2 pills for 3 days, 1 pill for 4 days 10 tablet 0   No current facility-administered medications for this visit.     Objective:  Temp (!) 97.3 F (36.3 C)  self reported vitals Gen: NAD, resting comfortably Audible wheeze Lungs: Audible wheeze.  Slight increased respiratory rate baseline but no significant respiratory distress noted Skin: appears dry, no obvious rash     Assessment and Plan   SOB and low oxygen S:pt c/o of SOB and low oxygen and she states this is typical for her but she has noticed with the weather change that it has been harder for her to breathe. She states that a breathing treatment and prednisone has helped her in the past. She states that her o2 waivered between 88-89 earlier.Minimal increase in cough- mainly dry. Albuterol helpful- has used once today.   Has nail polish on nails- best she could get was around 88-89%  In regards to COVID-19 vaccination-1st shot 3 weeks ago on 17th for covid 19 and next one on Thursday.   ROS- No fever, chills, congestion, runny nose, , fatigue, body aches, sore throat, headache, nausea, vomiting, diarrhea, or new loss of taste or smell. No known contacts with covid 19 or someone being tested for covid 19.  A/P: 85 year old female with known COPD who traditionally gets a COPD exacerbation around this time of year.  She reports increased shortness of breath, lower than normal oxygen levels (though cannot get a true reading with nail polish and encouraged her to remove to recheck), wheezing and  slight increase in cough -Suspect COPD exacerbation.  Treat with course of prednisone.  No increased sputum production or change in sputum to suggest need for antibiotics at this time -Encouraged her to remove nail  polish so we can get a better estimate of her oxygen levels -Also has below needs to be tested for COVID-19 due to potential overlap -Recommended moving COVID-19 vaccination out about 7 to 10 days to allow prednisone to get out of system as not sure how this would affect vaccination   Patient with symptoms concerning for potential covid 19 Therefore: -information provided on testing scheduling "please text "COVID" to [phone], OR you can log on to [email or web address] to easily make an on-line appointment. "  - recommended patient watch closely for shortness of breath or confusion or worsening symptoms and if those occur he should contact us immediately  -recommended patient consider purchasing pulse oximeter and if levels 90% or below persistently without nail polish- seek care at the hospital  -recommended self isolation until negative test  at minimum. Also discussed potential for false negatives and to still be cautious  even with negative test  - for quarantine if covid 19 test positive would need to be at least 10 days since first symptom AND at least 24 hours fever free without fever reducing medications AND improvement in respiratory symptoms  - we also discussed close contacts would need 14 day quarantine after last close contact with patient IF patient is positive  -Hopeful results back within 48 hours of test but may take up to a week  - would be interested in outpatient antibody treatment  Recommended follow up: As needed for acute concern-otherwise scheduled in May Future Appointments  Date Time Provider [place]  08/11/2019  4:40 PM Marin Olp, MD [facility]  08/14/2019  3:30 PM [place]  08/26/2019 10:10 AM Pieter Partridge, DO [facility] None  11/19/2019  8:00 AM Marin Olp, MD [facility]    Lab/Order associations:   ICD-10-CM   1. COPD exacerbation ([place])  J44.1     Meds ordered this encounter  Medications  . predniSONE  (DELTASONE) 20 MG tablet    Sig: Take 2 pills for 3 days, 1 pill for 4 days    Dispense:  10 tablet    Refill:  0    Return precautions advised.  Garret Reddish, MD

## 2019-08-11 NOTE — Telephone Encounter (Signed)
Called to set up but you were on call with her. Do you need me to do anything further?

## 2019-08-12 ENCOUNTER — Ambulatory Visit: Payer: Medicare HMO | Attending: Internal Medicine

## 2019-08-12 DIAGNOSIS — Z20822 Contact with and (suspected) exposure to covid-19: Secondary | ICD-10-CM

## 2019-08-13 LAB — NOVEL CORONAVIRUS, NAA: SARS-CoV-2, NAA: NOT DETECTED

## 2019-08-14 ENCOUNTER — Ambulatory Visit: Payer: Medicare HMO | Attending: Internal Medicine

## 2019-08-14 DIAGNOSIS — Z23 Encounter for immunization: Secondary | ICD-10-CM | POA: Insufficient documentation

## 2019-08-14 NOTE — Progress Notes (Signed)
   Covid-19 Vaccination Clinic  Name:  DESIRRE EICKHOFF    MRN: [id number] DOB: [birth year]  08/14/2019  Ms. Giovannini was observed post Covid-19 immunization for 15 minutes without incidence. She was provided with Vaccine Information Sheet and instruction to access the V-Safe system.   Ms. Lannan was instructed to call 911 with any severe reactions post vaccine: Marland Kitchen Difficulty breathing  . Swelling of your face and throat  . A fast heartbeat  . A bad rash all over your body  . Dizziness and weakness    Immunizations Administered    Name Date Dose VIS Date Route   Pfizer COVID-19 Vaccine 08/14/2019  3:13 PM 0.3 mL 06/20/2019 Intramuscular   Manufacturer: [place]   Lot: [id number]   [place]: [id number]

## 2019-08-25 ENCOUNTER — Encounter: Payer: Self-pay | Admitting: Neurology

## 2019-08-25 NOTE — Progress Notes (Signed)
Due to the COVID-19 crisis, this virtual check-in visit was done via telephone from my office and it was initiated and consent given by this patient and or family.   Telephone (Audio) Visit The purpose of this telephone visit is to provide medical care while limiting exposure to the novel coronavirus.    Consent was obtained for telephone visit and initiated by pt/family:  Yes.   Answered questions that patient had about telehealth interaction:  Yes.   I discussed the limitations, risks, security and privacy concerns of performing an evaluation and management service by telephone. I also discussed with the patient that there may be a patient responsible charge related to this service. The patient expressed understanding and agreed to proceed.  Pt location: Home Physician Location: office Name of referring provider:  Briscoe Deutscher, DO I connected with .Sheila Shields at patients initiation/request on 08/26/2019 at 10:10 AM EST by telephone and verified that I am speaking with the correct person using two identifiers.  Pt MRN:  [id number] Pt DOB:  1935/06/15  History of Present Illness:  Sheila Shields is an 85 year old woman with COPD, hypertension, hyperlipidemia, arthritis, alcoholism in remission and ocular migraines who presents for transient amnesia.  UPDATE: Current medications:  ASA 325mg ; Plavix 75mg  daily; simvastatin 40mg ; Zetia 10mg   Due to recurrence of transient global amnesia, she had an EEG on 04/07/2019 which was normal awake and asleep.  She underwent a repeat MRI of brain on 05/27/2019 which was personally reviewed and was stable with no acute intracranial abnormality.  MRA of head showed interval treatment of supraclinoid right ICA aneurysm but otherwise unremarkable.  Since her procedure, Dr. Estanislado Pandy has stated that she needs to be on ASA 325mg  daily in addition to Plavix.  She reports bruising and wants to know if the ASA can at least be reduced to 81mg .  She has not had  any recurrent TGA.  Still with dull headache but manageable.  Would prefer avoiding medications.  HISTORY: She has longstanding history of ocular migraines presenting as wavy lines in her vision. She had increased frequency of ocular migraines for a few days about a month ago. She was evaluated by ophthalmology with unremarkable exam. , she developed a dull nonthrobbing headache across the forehead and down the left side of her face, ear and neck. On Sunday night, she was relaxing in her recliner when she developed left sided facial numbness lasting less than 5 minutes. 45 minutes later, her left hand became numb and contracted into a fist. She couldn't open her fist. This lasted less than 5 minutes. There was no associated visual disturbance, facial droop, slurred speech or unilateral pain or weakness of the arm and hand. CT of head without contrast performed yesterday was personally reviewed and demonstrated mild chronic small vessel ischemic changes and atrophy but no acute intracranial abnormality.Last night, her left arm and hand became numb for a couple of minutes. No recurrent episodes today but continues to have mild headache and left sided neck pain.  She just underwent a TIA workup: MRI of brain from 11/05/18 personally reviewed and demonstrated age-related atrophy and mild chronic small vessel ischemic changes but no acute or subacute infarct. 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 Direct LDL from 01/21/19 was 94. She was advised to increase atorvastatin from 20mg  to 40mg  daily, however it caused myalgias. She was changed back to to simvastatin  She saw Dr.  Estanislado Pandy of IR for the Right ICA intracranial aneurysm and underwent endovascular embolization in June. Since the procedure, she has had a dull persistent headache.  She really only notices it when she is resting.  She doesn't pay attention to it if she is active and preoccupied.  On 02/26/19, she and her husband were having lunch with friends when she suddenly realized she was referring to her current husband by her first husband's name.  It was a strange feeling of being taken back in time.  No headache, loss of awareness, phantosmia, gastric uprising, slurred speech, facial droop, dizziness or unilateral numbness or weakness.  This lasted up to 5 minutes at the most.  She reports a similar event 20 years ago at her doctor's office who diagnosed her with transient global amnesia.  However, that episode lasted about 45 minutes.  Past Medical History: Past Medical History:  Diagnosis Date  . Alcoholism in remission ([place]) 08/27/2007  . Anemia   . Arthritis    OA  . COPD 12/17/2006  . Diverticulosis 03/27/2007  . GERD 05/01/2007  . HYPERGLYCEMIA 08/27/2007  . HYPERLIPIDEMIA 12/17/2006  . HYPERTENSION 12/17/2006  . Mood disorder ([facility])    hx anxiety and depression. meds short term during life transition  . Pneumonia    AS A CHILD  . Rectal polyp 03/27/2007   adenoma  . TIA (transient ischemic attack)     Medications: Outpatient Encounter Medications as of 08/26/2019  Medication Sig  . acetaminophen ([employer] ARTHRITIS PAIN RELIEF) 650 MG CR tablet Take 1,300 mg by mouth 2 (two) times daily.  Marland Kitchen albuterol (PROAIR HFA) 108 (90 Base) MCG/ACT inhaler INHALE 2 PUFFS EVERY 4 HOURS AS NEEDED FOR COUGHING SPELLS (Patient taking differently: Inhale 2 puffs into the lungs every 4 (four) hours as needed (coughing spells). )  . aspirin 325 MG tablet Take 1 tablet (325 mg total) by mouth daily.  . clopidogrel (PLAVIX) 75 MG tablet TAKE ONE TABLET BY MOUTH DAILY  . Coenzyme Q10 (COQ10 PO) Take 1 capsule by mouth  daily.   . diclofenac Sodium (VOLTAREN) 1 % GEL diclofenac 1 % topical gel  . ferrous sulfate 325 (65 FE) MG tablet TAKE 1 TABLET BY MOUTH EVERY DAY (Patient taking differently: Take 325 mg by mouth daily with breakfast. )  . fexofenadine (ALLEGRA) 180 MG tablet Take 180 mg by mouth daily.  . fluticasone (FLONASE) 50 MCG/ACT nasal spray Place 2 sprays into both nostrils daily.  . hydrochlorothiazide (HYDRODIURIL) 25 MG tablet Take 1 tablet (25 mg total) by mouth daily.  . hydrocortisone 2.5 % cream Apply 1 application topically 2 (two) times daily as needed. Inflammation/arthritis  . Multiple Vitamin (MULTIVITAMIN WITH MINERALS) TABS tablet Take 1 tablet by mouth daily.  . pantoprazole (PROTONIX) 20 MG tablet Take 1 tablet (20 mg total) by mouth daily.  . potassium chloride SA (KLOR-CON) 20 MEQ tablet Take 1 tablet (20 mEq total) by mouth daily.  . predniSONE (DELTASONE) 20 MG tablet Take 2 pills for 3 days, 1 pill for 4 days  . Propylene Glycol (SYSTANE BALANCE OP) Place 1 drop into both eyes every 6 (six) hours as needed (dry eyes).  . simvastatin (ZOCOR) 40 MG tablet TAKE ONE TABLET BY MOUTH DAILY  . Vitamin D, Cholecalciferol, 1000  units CAPS Take 1,000 Units by mouth daily.    No facility-administered encounter medications on file as of 08/26/2019.    Allergies: Allergies  Allergen Reactions  . Alcohol Other (See Comments)    Recovering Alcoholic*  . Codeine     Patient prefers not to take this due to history of alcoholism  . Dilaudid [Hydromorphone Hcl]     Pt had hypotension after dilaudid 1mg  IV while in the OR, required temporary pressor intervention.    Family History: Family History  Problem Relation Age of Onset  . Throat cancer Mother        57  . Heart attack Father 68  . Idiopathic pulmonary fibrosis Brother 36  . Cancer Brother        sarcoma  . Heart disease Brother   . Colon cancer Neg Hx   . Esophageal cancer Neg Hx   . Stomach cancer Neg Hx     Social  History: Social History   Socioeconomic History  . Marital status: Married    Spouse name: Not on file  . Number of children: 2  . Years of education: Not on file  . Highest education level: Not on file  Occupational History  . Occupation: retired  Tobacco Use  . Smoking status: Former Smoker    Packs/day: 2.00    Years: 30.00    Pack years: 60.00    Types: Cigarettes    Quit date: 07/10/1984    Years since quitting: 35.1  . Smokeless tobacco: Never Used  Substance and Sexual Activity  . Alcohol use: No    Alcohol/week: 0.0 standard drinks    Comment: no alcoho since 1983  . Drug use: No  . Sexual activity: Not on file  Other Topics Concern  . Not on file  Social History Narrative   Home Situation: lives with husband. 2 daughters, 2 step daughters, 6 grandkids. 2 greatgrandkids    Work or School: very active in [employer] and church      Spiritual Beliefs: Darrick Meigs      Lifestyle:tries to walk; diet ok - denies any alcohol or tobacco use in many many years      Caffeine 1 cup coffee/day      Hobbies: news junkie, audio books   Social Determinants of Health   Financial Resource Strain:   . Difficulty of Paying Living Expenses: Not on file  Food Insecurity:   . Worried About Charity fundraiser in the Last Year: Not on file  . Ran Out of Food in the Last Year: Not on file  Transportation Needs:   . Lack of Transportation (Medical): Not on file  . Lack of Transportation (Non-Medical): Not on file  Physical Activity:   . Days of Exercise per Week: Not on file  . Minutes of Exercise per Session: Not on file  Stress:   . Feeling of Stress : Not on file  Social Connections:   . Frequency of Communication with Friends and Family: Not on file  . Frequency of Social Gatherings with Friends and Family: Not on file  . Attends Religious Services: Not on file  . Active Member of Clubs or Organizations: Not on file  . Attends Archivist Meetings: Not on file  . Marital  Status: Not on file  Intimate Partner Violence:   . Fear of Current or Ex-Partner: Not on file  . Emotionally Abused: Not on file  . Physically Abused: Not on file  . Sexually Abused: Not  on file    Observations/Objective:   Height 5\' 6"  (1.676 m), weight 194 lb (88 kg).   Assessment and Plan:   1.  Possible cerebrovascular event/TIA versus complicated migraine.  However given her stroke risk factors (age, hyperlipidemia, hypertension) and no prior history of migraines with similar symptoms, I would more strongly consider CVA vs TIA. 2.  Recurrent transient global amnesia (second one).  Workup unremarkable 3.  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  Ocular migraine 5.  Hyperlipidemia 6.  Hypertension  1.  On ASA 325mg  and Plavix 75mg  daily.  She is on Plavix for secondary stroke prevention.  I advised that she discuss ASA regimen with Dr. Estanislado Pandy. 2.  Statin therapy (LDL goal less than 70) 3.  Blood pressure control 4.  Follow up with Dr. Estanislado Pandy as scheduled. 5.  Follow up in 6 months.  Follow Up Instructions:    -I discussed the assessment and treatment plan with the patient. The patient was provided an opportunity to ask questions and all were answered. The patient agreed with the plan and demonstrated an understanding of the instructions.   The patient was advised to call back or seek an in-person evaluation if the symptoms worsen or if the condition fails to improve as anticipated.    Total Time spent in visit with the patient was:  12 minutes   Dudley Major, DO

## 2019-08-26 ENCOUNTER — Telehealth (INDEPENDENT_AMBULATORY_CARE_PROVIDER_SITE_OTHER): Payer: Medicare HMO | Admitting: Neurology

## 2019-08-26 ENCOUNTER — Other Ambulatory Visit: Payer: Self-pay

## 2019-08-26 ENCOUNTER — Telehealth (HOSPITAL_COMMUNITY): Payer: Self-pay

## 2019-08-26 ENCOUNTER — Encounter: Payer: Self-pay | Admitting: Neurology

## 2019-08-26 VITALS — Ht 66.0 in | Wt 194.0 lb

## 2019-08-26 DIAGNOSIS — I671 Cerebral aneurysm, nonruptured: Secondary | ICD-10-CM | POA: Diagnosis not present

## 2019-08-26 DIAGNOSIS — E785 Hyperlipidemia, unspecified: Secondary | ICD-10-CM | POA: Diagnosis not present

## 2019-08-26 DIAGNOSIS — I10 Essential (primary) hypertension: Secondary | ICD-10-CM | POA: Diagnosis not present

## 2019-08-26 DIAGNOSIS — G459 Transient cerebral ischemic attack, unspecified: Secondary | ICD-10-CM | POA: Diagnosis not present

## 2019-08-26 DIAGNOSIS — G454 Transient global amnesia: Secondary | ICD-10-CM | POA: Diagnosis not present

## 2019-08-26 NOTE — Telephone Encounter (Signed)
Pt called with complaints of bruising all over. I sent a message to our PA. She will need to come in for a p2y12 and then they will change her medication as needed after the results are back. Pt agreed with this. She will come by on 08/27/19 to have this done. AW

## 2019-08-27 ENCOUNTER — Telehealth: Payer: Self-pay | Admitting: Student

## 2019-08-27 ENCOUNTER — Other Ambulatory Visit (HOSPITAL_COMMUNITY): Payer: Self-pay | Admitting: Radiology

## 2019-08-27 DIAGNOSIS — I671 Cerebral aneurysm, nonruptured: Secondary | ICD-10-CM

## 2019-08-27 LAB — PLATELET INHIBITION P2Y12: Platelet Function  P2Y12: 8 [PRU] — ABNORMAL LOW (ref 182–335)

## 2019-08-27 NOTE — Telephone Encounter (Signed)
Sheila Shields.  Patient was complaining of easy bruising. Currently on Plavix 75 mg once daily and Aspirin 325 mg once daily. Patient came for P2Y12 today- 8 PRU. Discussed result with Dr. Estanislado Pandy who recommends patient continue Aspirin 325 mg once daily, discontinue taking Plavix 75 mg once daily, and begin taking Plavix 37.5 mg once daily. Called patient at 1510 to discuss above. All questions answered and concerns addressed. Patient conveys understanding and agrees with plan.  Please call Sheila Shields with questions/concerns.   Bea Graff Lincy Belles, PA-C 08/27/2019, 3:12 PM

## 2019-09-18 DIAGNOSIS — R69 Illness, unspecified: Secondary | ICD-10-CM | POA: Diagnosis not present

## 2019-09-22 DIAGNOSIS — H43813 Vitreous degeneration, bilateral: Secondary | ICD-10-CM | POA: Diagnosis not present

## 2019-09-22 DIAGNOSIS — H26493 Other secondary cataract, bilateral: Secondary | ICD-10-CM | POA: Diagnosis not present

## 2019-09-22 DIAGNOSIS — H5213 Myopia, bilateral: Secondary | ICD-10-CM | POA: Diagnosis not present

## 2019-09-22 DIAGNOSIS — H52202 Unspecified astigmatism, left eye: Secondary | ICD-10-CM | POA: Diagnosis not present

## 2019-09-22 DIAGNOSIS — H524 Presbyopia: Secondary | ICD-10-CM | POA: Diagnosis not present

## 2019-09-22 DIAGNOSIS — Z961 Presence of intraocular lens: Secondary | ICD-10-CM | POA: Diagnosis not present

## 2019-09-23 DIAGNOSIS — Z0100 Encounter for examination of eyes and vision without abnormal findings: Secondary | ICD-10-CM | POA: Diagnosis not present

## 2019-09-30 DIAGNOSIS — H903 Sensorineural hearing loss, bilateral: Secondary | ICD-10-CM | POA: Diagnosis not present

## 2019-10-06 DIAGNOSIS — M25571 Pain in right ankle and joints of right foot: Secondary | ICD-10-CM | POA: Diagnosis not present

## 2019-10-06 DIAGNOSIS — M19079 Primary osteoarthritis, unspecified ankle and foot: Secondary | ICD-10-CM | POA: Diagnosis not present

## 2019-10-06 DIAGNOSIS — M19071 Primary osteoarthritis, right ankle and foot: Secondary | ICD-10-CM | POA: Diagnosis not present

## 2019-10-09 ENCOUNTER — Encounter: Payer: Self-pay | Admitting: Family Medicine

## 2019-11-02 ENCOUNTER — Other Ambulatory Visit: Payer: Self-pay | Admitting: Neurology

## 2019-11-17 ENCOUNTER — Other Ambulatory Visit (HOSPITAL_COMMUNITY): Payer: Self-pay | Admitting: Interventional Radiology

## 2019-11-17 DIAGNOSIS — I671 Cerebral aneurysm, nonruptured: Secondary | ICD-10-CM

## 2019-11-19 ENCOUNTER — Ambulatory Visit (INDEPENDENT_AMBULATORY_CARE_PROVIDER_SITE_OTHER): Payer: Medicare HMO | Admitting: Family Medicine

## 2019-11-19 ENCOUNTER — Encounter: Payer: Self-pay | Admitting: Family Medicine

## 2019-11-19 ENCOUNTER — Other Ambulatory Visit: Payer: Self-pay

## 2019-11-19 VITALS — BP 120/78 | HR 56 | Temp 97.2°F | Ht 66.0 in | Wt 199.0 lb

## 2019-11-19 DIAGNOSIS — E782 Mixed hyperlipidemia: Secondary | ICD-10-CM

## 2019-11-19 DIAGNOSIS — J410 Simple chronic bronchitis: Secondary | ICD-10-CM | POA: Diagnosis not present

## 2019-11-19 DIAGNOSIS — I700 Atherosclerosis of aorta: Secondary | ICD-10-CM

## 2019-11-19 DIAGNOSIS — D509 Iron deficiency anemia, unspecified: Secondary | ICD-10-CM | POA: Diagnosis not present

## 2019-11-19 DIAGNOSIS — R7309 Other abnormal glucose: Secondary | ICD-10-CM

## 2019-11-19 DIAGNOSIS — I671 Cerebral aneurysm, nonruptured: Secondary | ICD-10-CM

## 2019-11-19 DIAGNOSIS — K219 Gastro-esophageal reflux disease without esophagitis: Secondary | ICD-10-CM | POA: Diagnosis not present

## 2019-11-19 DIAGNOSIS — R69 Illness, unspecified: Secondary | ICD-10-CM | POA: Diagnosis not present

## 2019-11-19 DIAGNOSIS — F1021 Alcohol dependence, in remission: Secondary | ICD-10-CM

## 2019-11-19 DIAGNOSIS — I10 Essential (primary) hypertension: Secondary | ICD-10-CM

## 2019-11-19 LAB — CBC WITH DIFFERENTIAL/PLATELET
Basophils Absolute: 0.1 10*3/uL (ref 0.0–0.1)
Basophils Relative: 1.3 % (ref 0.0–3.0)
Eosinophils Absolute: 0.9 10*3/uL — ABNORMAL HIGH (ref 0.0–0.7)
Eosinophils Relative: 11.6 % — ABNORMAL HIGH (ref 0.0–5.0)
HCT: 41.6 % (ref 36.0–46.0)
Hemoglobin: 14.1 g/dL (ref 12.0–15.0)
Lymphocytes Relative: 19.9 % (ref 12.0–46.0)
Lymphs Abs: 1.5 10*3/uL (ref 0.7–4.0)
MCHC: 33.8 g/dL (ref 30.0–36.0)
MCV: 93.3 fl (ref 78.0–100.0)
Monocytes Absolute: 0.9 10*3/uL (ref 0.1–1.0)
Monocytes Relative: 11.8 % (ref 3.0–12.0)
Neutro Abs: 4.3 10*3/uL (ref 1.4–7.7)
Neutrophils Relative %: 55.4 % (ref 43.0–77.0)
Platelets: 282 10*3/uL (ref 150.0–400.0)
RBC: 4.46 Mil/uL (ref 3.87–5.11)
RDW: 13.8 % (ref 11.5–15.5)
WBC: 7.8 10*3/uL (ref 4.0–10.5)

## 2019-11-19 LAB — COMPREHENSIVE METABOLIC PANEL
ALT: 12 U/L (ref 0–35)
AST: 16 U/L (ref 0–37)
Albumin: 4.1 g/dL (ref 3.5–5.2)
Alkaline Phosphatase: 67 U/L (ref 39–117)
BUN: 24 mg/dL — ABNORMAL HIGH (ref 6–23)
CO2: 32 mEq/L (ref 19–32)
Calcium: 10.2 mg/dL (ref 8.4–10.5)
Chloride: 102 mEq/L (ref 96–112)
Creatinine, Ser: 0.8 mg/dL (ref 0.40–1.20)
GFR: 68.53 mL/min (ref >60.00)
Glucose, Bld: 95 mg/dL (ref 70–99)
Potassium: 3.8 mEq/L (ref 3.5–5.1)
Sodium: 143 mEq/L (ref 135–145)
Total Bilirubin: 0.6 mg/dL (ref 0.2–1.2)
Total Protein: 6.2 g/dL (ref 6.0–8.3)

## 2019-11-19 LAB — IBC + FERRITIN
Ferritin: 54.6 ng/mL (ref 10.0–291.0)
Iron: 179 ug/dL — ABNORMAL HIGH (ref 42–145)
Saturation Ratios: 49.9 % (ref 20.0–50.0)
Transferrin: 256 mg/dL (ref 212.0–360.0)

## 2019-11-19 LAB — LIPID PANEL
Cholesterol: 166 mg/dL (ref 0–200)
HDL: 40.9 mg/dL (ref >39.00)
NonHDL: 125.36
Total CHOL/HDL Ratio: 4
Triglycerides: 238 mg/dL — ABNORMAL HIGH (ref 0.0–149.0)
VLDL: 47.6 mg/dL — ABNORMAL HIGH (ref 0.0–40.0)

## 2019-11-19 LAB — HEMOGLOBIN A1C: Hgb A1c MFr Bld: 5.8 % (ref 4.6–6.5)

## 2019-11-19 LAB — LDL CHOLESTEROL, DIRECT: Direct LDL: 79 mg/dL

## 2019-11-19 MED ORDER — ROSUVASTATIN CALCIUM 20 MG PO TABS
20.0000 mg | ORAL_TABLET | Freq: Every day | ORAL | 3 refills | Status: DC
Start: 2019-11-19 — End: 2020-11-02
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
## 2019-11-19 NOTE — Assessment & Plan Note (Signed)
#  GERD-controlled on Protonix- was able to titrate down to 20mg  without issues

## 2019-11-19 NOTE — Progress Notes (Signed)
Done

## 2019-11-19 NOTE — Patient Instructions (Addendum)
Glad you are doing well.  Congratulations on 26 years sober!  Please stop by lab before you go If you have mychart- we will send your results within 3 business days of [place] receiving them.  If you do not have mychart- we will call you about results within 5 business days of [place] receiving them.

## 2019-11-19 NOTE — Assessment & Plan Note (Signed)
#  brain aneurysm follows with IR Dr. Anabel Bene of possible TIA-normal EEG.  Patient did have MRI/MRA supraclinoid right ICA aneurysm which has been stented by interventional radiology-appears stable in November with 60-month repeat planned.  She was on Plavix and aspirin-today she is on half dose of plavix and full dose aspirin 325. Complains of senile purpura with this- noted/explained.

## 2019-12-05 ENCOUNTER — Ambulatory Visit (HOSPITAL_COMMUNITY)
Admission: RE | Admit: 2019-12-05 | Discharge: 2019-12-05 | Disposition: A | Discharge status: Home / Self Care | Payer: Medicare HMO | Source: Ambulatory Visit | Attending: Interventional Radiology | Admitting: Interventional Radiology

## 2019-12-05 ENCOUNTER — Other Ambulatory Visit: Payer: Self-pay

## 2019-12-05 DIAGNOSIS — I671 Cerebral aneurysm, nonruptured: Secondary | ICD-10-CM | POA: Diagnosis not present

## 2019-12-09 ENCOUNTER — Other Ambulatory Visit: Payer: Self-pay | Admitting: Family Medicine

## 2019-12-09 ENCOUNTER — Telehealth (HOSPITAL_COMMUNITY): Payer: Self-pay

## 2019-12-09 DIAGNOSIS — Z1231 Encounter for screening mammogram for malignant neoplasm of breast: Secondary | ICD-10-CM

## 2019-12-09 NOTE — Telephone Encounter (Signed)
Pt agreed to f/u in 6 months with mri/mra wo. AW

## 2019-12-10 DIAGNOSIS — L720 Epidermal cyst: Secondary | ICD-10-CM | POA: Diagnosis not present

## 2019-12-10 DIAGNOSIS — L821 Other seborrheic keratosis: Secondary | ICD-10-CM | POA: Diagnosis not present

## 2019-12-10 DIAGNOSIS — Z85820 Personal history of malignant melanoma of skin: Secondary | ICD-10-CM | POA: Diagnosis not present

## 2019-12-10 DIAGNOSIS — D692 Other nonthrombocytopenic purpura: Secondary | ICD-10-CM | POA: Diagnosis not present

## 2019-12-10 DIAGNOSIS — D485 Neoplasm of uncertain behavior of skin: Secondary | ICD-10-CM | POA: Diagnosis not present

## 2019-12-10 DIAGNOSIS — L814 Other melanin hyperpigmentation: Secondary | ICD-10-CM | POA: Diagnosis not present

## 2020-01-05 ENCOUNTER — Telehealth (INDEPENDENT_AMBULATORY_CARE_PROVIDER_SITE_OTHER): Payer: Medicare HMO | Admitting: Physician Assistant

## 2020-01-05 ENCOUNTER — Encounter: Payer: Self-pay | Admitting: Physician Assistant

## 2020-01-05 ENCOUNTER — Telehealth: Payer: Self-pay

## 2020-01-05 ENCOUNTER — Encounter: Payer: Self-pay | Admitting: Family Medicine

## 2020-01-05 ENCOUNTER — Other Ambulatory Visit: Payer: Self-pay

## 2020-01-05 VITALS — BP 129/81 | HR 77 | Ht 66.0 in | Wt 199.0 lb

## 2020-01-05 DIAGNOSIS — R0981 Nasal congestion: Secondary | ICD-10-CM

## 2020-01-05 MED ORDER — DOXYCYCLINE HYCLATE 100 MG PO TABS
100.0000 mg | ORAL_TABLET | Freq: Two times a day (BID) | ORAL | 0 refills | Status: DC
Start: 2020-01-05 — End: 2020-02-18

## 2020-01-05 NOTE — Telephone Encounter (Signed)
Called and left patient a voicemail to return call in regards to her appointment today with [facility].

## 2020-01-05 NOTE — Progress Notes (Signed)
Virtual Visit via Video   I connected with Sheila Shields on 01/05/20 at  4:00 PM EDT by a video enabled telemedicine application and verified that I am speaking with the correct person using two identifiers. Location patient: Home Location provider: [facility], Office Persons participating in the virtual visit: Sheila Shields, [name] PA-C  I discussed the limitations of evaluation and management by telemedicine and the availability of in person appointments. The patient expressed understanding and agreed to proceed.  Subjective:   HPI:   URI symptoms Patient reports that over the past few weeks she has had a culmination of symptoms including: Sensation of fluid in her ears, crusting in her ears, cough, nasal congestion and frontal headache.  She denies any neurological symptoms including: Dizziness, weakness, slurred speech, fever, fatigue, chills.  She has not tried anything for her symptoms.  She has a history of brain aneurysm and had recent MRI which was normal.  She also endorses that she is under significant stress.  She also has a prescription for Flonase but is not using it.  She is getting occasional lightheadedness that she feels is due to dehydration and sinus fullness.  ROS: See pertinent positives and negatives per HPI.  Patient Active Problem List   Diagnosis Date Noted   Former smoker 05/22/2019   Brain aneurysm 12/30/2018   Aortic atherosclerosis ([place]) 04/11/2018   Allergic rhinitis 11/09/2017   Arm mass, left 04/16/2017   Chronic cough 10/11/2015   COPD (chronic obstructive pulmonary disease) ([place]) 11/18/2014   Iron deficiency anemia, unspecified 01/20/2011   Arthropathy 02/23/2010   HYPERGLYCEMIA 08/27/2007   Alcoholism in remission ([place]) 08/27/2007   Gastroesophageal reflux disease 05/01/2007   Hyperlipemia 12/17/2006   Essential hypertension 12/17/2006    Social History   Tobacco Use   Smoking status: Former Smoker     Packs/day: 2.00    Years: 30.00    Pack years: 60.00    Types: Cigarettes    Quit date: 07/10/1984    Years since quitting: 35.5   Smokeless tobacco: Never Used  Substance Use Topics   Alcohol use: No    Alcohol/week: 0.0 standard drinks    Comment: no alcoho since 1983    Current Outpatient Medications:    acetaminophen ([employer] ARTHRITIS PAIN RELIEF) 650 MG CR tablet, Take 1,300 mg by mouth 2 (two) times daily., Disp: , Rfl:    albuterol (PROAIR HFA) 108 (90 Base) MCG/ACT inhaler, INHALE 2 PUFFS EVERY 4 HOURS AS NEEDED FOR COUGHING SPELLS (Patient taking differently: Inhale 2 puffs into the lungs every 4 (four) hours as needed (coughing spells). ), Disp: 8.5 Inhaler, Rfl: 2   aspirin 325 MG tablet, Take 1 tablet (325 mg total) by mouth daily., Disp: 30 tablet, Rfl: 3   clopidogrel (PLAVIX) 75 MG tablet, TAKE ONE TABLET BY MOUTH DAILY (Patient taking differently: Take 35.5 mg by mouth daily. ), Disp: 90 tablet, Rfl: 1   Coenzyme Q10 (COQ10 PO), Take 1 capsule by mouth daily. , Disp: , Rfl:    diclofenac Sodium (VOLTAREN) 1 % GEL, diclofenac 1 % topical gel, Disp: , Rfl:    ferrous sulfate 325 (65 FE) MG tablet, TAKE 1 TABLET BY MOUTH EVERY DAY (Patient taking differently: Take 325 mg by mouth daily with breakfast. ), Disp: 90 tablet, Rfl: 3   fexofenadine (ALLEGRA) 180 MG tablet, Take 180 mg by mouth daily., Disp: , Rfl:    fluticasone (FLONASE) 50 MCG/ACT nasal spray, Place 2 sprays into both  nostrils daily., Disp: 16 g, Rfl: 2   hydrochlorothiazide (HYDRODIURIL) 25 MG tablet, Take 1 tablet (25 mg total) by mouth daily., Disp: 90 tablet, Rfl: 3   hydrocortisone 2.5 % cream, Apply 1 application topically 2 (two) times daily as needed. Inflammation/arthritis, Disp: , Rfl:    Multiple Vitamin (MULTIVITAMIN WITH MINERALS) TABS tablet, Take 1 tablet by mouth daily., Disp: , Rfl:    pantoprazole (PROTONIX) 20 MG tablet, Take 1 tablet (20 mg total) by mouth daily., Disp: 90 tablet, Rfl:  3   potassium chloride SA (KLOR-CON) 20 MEQ tablet, Take 1 tablet (20 mEq total) by mouth daily., Disp: 90 tablet, Rfl: 3   Propylene Glycol (SYSTANE BALANCE OP), Place 1 drop into both eyes every 6 (six) hours as needed (dry eyes)., Disp: , Rfl:    traMADol (ULTRAM) 50 MG tablet, Take 50 mg by mouth every 6 (six) hours as needed., Disp: , Rfl:    Vitamin D, Cholecalciferol, 1000 units CAPS, Take 1,000 Units by mouth daily. , Disp: , Rfl:    doxycycline (VIBRA-TABS) 100 MG tablet, Take 1 tablet (100 mg total) by mouth 2 (two) times daily., Disp: 20 tablet, Rfl: 0   rosuvastatin (CRESTOR) 20 MG tablet, Take 1 tablet (20 mg total) by mouth daily. (Patient not taking: Reported on 01/05/2020), Disp: 90 tablet, Rfl: 3  Allergies  Allergen Reactions   Alcohol Other (See Comments)    Recovering Alcoholic*   Codeine     Patient prefers not to take this due to history of alcoholism   Dilaudid [Hydromorphone Hcl]     Pt had hypotension after dilaudid 1mg  IV while in the OR, required temporary pressor intervention.    Objective:   VITALS: Per patient if applicable, see vitals. GENERAL: Alert, appears well and in no acute distress. HEENT: Atraumatic, conjunctiva clear, no obvious abnormalities on inspection of external nose and ears. NECK: Normal movements of the head and neck. CARDIOPULMONARY: No increased WOB. Speaking in clear sentences. I:E ratio WNL.  MS: Moves all visible extremities without noticeable abnormality. PSYCH: Pleasant and cooperative, well-groomed. Speech normal rate and rhythm. Affect is appropriate. Insight and judgement are appropriate. Attention is focused, linear, and appropriate.  NEURO: CN grossly intact. Oriented as arrived to appointment on time with no prompting. Moves both UE equally.  SKIN: No obvious lesions, wounds, erythema, or cyanosis noted on face or hands.  Assessment and Plan:   Sheila Shields was seen today for dizziness, cough, ear dryness and neck  pain.  Diagnoses and all orders for this visit:  Sinus congestion  Other orders -     doxycycline (VIBRA-TABS) 100 MG tablet; Take 1 tablet (100 mg total) by mouth 2 (two) times daily.   No red flags on discussion.  Will initiate oral doxycycline per orders.  I have also asked patient to resume her Flonase and push fluids.  Discussed taking medications as prescribed. Reviewed return precautions including new fever, SOB, worsening cough or other concerns. Push fluids and rest. I recommend that patient follow-up if symptoms worsen or persist despite treatment x 7-10 days, sooner if needed.    Reviewed expectations re: course of current medical issues.  Discussed self-management of symptoms.  Outlined signs and symptoms indicating need for more acute intervention.  Patient verbalized understanding and all questions were answered.  Health Maintenance issues including appropriate healthy diet, exercise, and smoking avoidance were discussed with patient.  See orders for this visit as documented in the electronic medical record.  I discussed the  assessment and treatment plan with the patient. The patient was provided an opportunity to ask questions and all were answered. The patient agreed with the plan and demonstrated an understanding of the instructions.   The patient was advised to call back or seek an in-person evaluation if the symptoms worsen or if the condition fails to improve as anticipated.   CMA or LPN served as scribe during this visit. History, Physical, and Plan performed by medical provider. The above documentation has been reviewed and is accurate and complete.   [place], [place] 01/05/2020

## 2020-01-22 ENCOUNTER — Ambulatory Visit
Admission: RE | Admit: 2020-01-22 | Discharge: 2020-01-22 | Disposition: A | Discharge status: Home / Self Care | Payer: Medicare HMO | Source: Ambulatory Visit | Attending: Family Medicine | Admitting: Family Medicine

## 2020-01-22 ENCOUNTER — Other Ambulatory Visit: Payer: Self-pay

## 2020-01-22 DIAGNOSIS — Z1231 Encounter for screening mammogram for malignant neoplasm of breast: Secondary | ICD-10-CM

## 2020-01-27 ENCOUNTER — Other Ambulatory Visit: Payer: Self-pay | Admitting: Family Medicine

## 2020-01-27 DIAGNOSIS — R928 Other abnormal and inconclusive findings on diagnostic imaging of breast: Secondary | ICD-10-CM

## 2020-01-30 ENCOUNTER — Ambulatory Visit
Admission: RE | Admit: 2020-01-30 | Discharge: 2020-01-30 | Disposition: A | Discharge status: Home / Self Care | Payer: Medicare HMO | Source: Ambulatory Visit | Attending: Family Medicine | Admitting: Family Medicine

## 2020-01-30 ENCOUNTER — Other Ambulatory Visit: Payer: Self-pay | Admitting: Family Medicine

## 2020-01-30 ENCOUNTER — Other Ambulatory Visit: Payer: Self-pay

## 2020-01-30 DIAGNOSIS — R928 Other abnormal and inconclusive findings on diagnostic imaging of breast: Secondary | ICD-10-CM

## 2020-01-30 DIAGNOSIS — N6489 Other specified disorders of breast: Secondary | ICD-10-CM | POA: Diagnosis not present

## 2020-02-09 ENCOUNTER — Other Ambulatory Visit: Payer: Self-pay

## 2020-02-09 ENCOUNTER — Ambulatory Visit
Admission: RE | Admit: 2020-02-09 | Discharge: 2020-02-09 | Disposition: A | Discharge status: Home / Self Care | Payer: Medicare HMO | Source: Ambulatory Visit | Attending: Family Medicine | Admitting: Family Medicine

## 2020-02-09 DIAGNOSIS — R928 Other abnormal and inconclusive findings on diagnostic imaging of breast: Secondary | ICD-10-CM

## 2020-02-09 DIAGNOSIS — N6321 Unspecified lump in the left breast, upper outer quadrant: Secondary | ICD-10-CM | POA: Diagnosis not present

## 2020-02-09 DIAGNOSIS — C50412 Malignant neoplasm of upper-outer quadrant of left female breast: Secondary | ICD-10-CM | POA: Diagnosis not present

## 2020-02-12 ENCOUNTER — Encounter: Payer: Self-pay | Admitting: *Deleted

## 2020-02-12 DIAGNOSIS — C50412 Malignant neoplasm of upper-outer quadrant of left female breast: Secondary | ICD-10-CM | POA: Insufficient documentation

## 2020-02-12 DIAGNOSIS — Z170 Estrogen receptor positive status [ER+]: Secondary | ICD-10-CM

## 2020-02-16 NOTE — Progress Notes (Signed)
[place]   Telephone:([phone]) [phone] Fax:([phone]) [place] Note   Patient Care Team: Marin Olp, MD as PCP - General (Family Medicine) Pieter Partridge, DO as Consulting Physician (Neurology) Collene Gobble, MD as Consulting Physician (Pulmonary Disease) Bucks, P.A. as Consulting Physician Jarome Matin, MD as Consulting Physician (Dermatology) Luanne Bras, MD as Consulting Physician (Interventional Radiology) Mauro Kaufmann, RN as Oncology Nurse Navigator Rockwell Germany, RN as Oncology Nurse Navigator Truitt Merle, MD as Consulting Physician (Hematology) [name], MD as Consulting Physician (General Surgery) Kyung Rudd, MD as Consulting Physician (Radiation Oncology)  Date of Service:  02/18/2020   CHIEF COMPLAINTS/PURPOSE OF CONSULTATION:  Newly diagnosed Malignant neoplasm of upper-outer quadrant of left breast   Oncology History Overview Note  Cancer Staging Malignant neoplasm of upper-outer quadrant of left breast in female, estrogen receptor positive ([place]) Staging form: Breast, AJCC 8th Edition - Clinical stage from 02/09/2020: Stage IA (cT1b, cN0, cM0, G2, ER+, PR+, HER2-) - Signed by Truitt Merle, MD on 02/17/2020    Malignant neoplasm of upper-outer quadrant of left breast in female, estrogen receptor positive ([place])  01/30/2020 Mammogram   IMPRESSION: 0.6x0.4x0.6cm suspicious mass 12 o'clock axis left breast, 3 cm from the nipple.    02/09/2020 Receptors her2   PROGNOSTIC INDICATORS Results: IMMUNOHISTOCHEMICAL AND MORPHOMETRIC ANALYSIS PERFORMED MANUALLY The tumor cells are NEGATIVE for Her2 (1+). Estrogen Receptor: 95%, POSITIVE, STRONG STAINING INTENSITY Progesterone Receptor: 90%, POSITIVE, MODERATE STAINING INTENSITY Proliferation Marker Ki67: 15%   02/09/2020 Cancer Staging   Staging form: Breast, AJCC 8th Edition - Clinical stage from 02/09/2020: Stage IA (cT1b, cN0, cM0, G2, ER+, PR+, HER2-) -  Signed by Truitt Merle, MD on 02/17/2020   02/12/2020 Initial Diagnosis   Malignant neoplasm of upper-outer quadrant of left breast in female, estrogen receptor positive ([place])   02/16/2020 Initial Biopsy   Diagnosis Breast, left, needle core biopsy, 12:30, 3 cmfn - INVASIVE MAMMARY CARCINOMA, SEE COMMENT. - MAMMARY CARCINOMA IN SITU. Microscopic Comment The carcinoma appears grade 2 and measures 11 mm in greatest linear extent. E-cadherin will be ordered. Prognostic makers will be ordered. Dr. Saralyn Pilar has reviewed the case. The case was called to [facility] on 02/10/2020.      HISTORY OF PRESENTING ILLNESS:  Sheila Shields 85 y.o. female is a here because of newly diagnosed left breast. The patient presents to the Breast clinic today accompanied by her husband.   She notes her left breast mass was found be screening mammogram. She denies feeling her mass herself and any breast or body changes recently. She notes she had Right lumpectomy in 2002 in the past that was negative in [place]. She notes she was recently in high stress due to family health issues and now based on her current diagnosis and prognosis she feels much better now.   Socially she is married to her second husband with 2 adult children. 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 She quit smoking 2ppd in 1996 She has a PMHx of brain aneurysm that was banded in 12/2018. She continues to f/u with Dr Vickey Sages. She is on 335m aspirin. She did have very mild TIA and is seen by Dr JTana Felts She is also on half tablet Plavix. She also has minor COPD. She has arthritis in her hands and her back which is manageable with Tylenol. She has herniated disc, scoliosis and spinal stenosis. She had ortho  surgeries in the past and hysterectomy at 40 due to adenomyosis. She notes her brother had Sarcoma which was metastatic. Her mother had throat cancer due to smoking.    GYN HISTORY  Menarchal: 15 LMP:  1978 by hysterectomy  Contraceptive: 1963-1970 HRT: Yes, pills intermittently and patch until 2008 G2P3A1 (miscarriage): first at age 37    REVIEW OF SYSTEMS:  Constitutional: Denies fevers, chills or abnormal night sweats Eyes: Denies blurriness of vision, double vision or watery eyes Ears, nose, mouth, throat, and face: Denies mucositis or sore throat Respiratory: Denies cough, dyspnea or wheezes Cardiovascular: Denies palpitation, chest discomfort or lower extremity swelling Gastrointestinal:  Denies nausea, heartburn or change in bowel habits Skin: Denies abnormal skin rashes MSK; (+) OA in hands and chronic back pain  Lymphatics: Denies new lymphadenopathy (+) easy bruising Neurological:Denies numbness, tingling or new weaknesses Behavioral/Psych: Mood is stable, no new changes  All other systems were reviewed with the patient and are negative.   MEDICAL HISTORY:  Past Medical History:  Diagnosis Date  . Alcoholism in remission ([place]) 08/27/2007  . Anemia   . Arthritis    OA  . COPD 12/17/2006  . Diverticulosis 03/27/2007  . GERD 05/01/2007  . HYPERGLYCEMIA 08/27/2007  . HYPERLIPIDEMIA 12/17/2006  . HYPERTENSION 12/17/2006  . Mood disorder ([facility])    hx anxiety and depression. meds short term during life transition  . Pneumonia    AS A CHILD  . Rectal polyp 03/27/2007   adenoma  . TIA (transient ischemic attack)     SURGICAL HISTORY: Past Surgical History:  Procedure Laterality Date  . ABDOMINAL HYSTERECTOMY  1978   fibroma  . APPENDECTOMY  2002  . BREAST EXCISIONAL BIOPSY Right 2003   negative  . CARPAL TUNNEL RELEASE Left 2010 or 2011  . COLONOSCOPY W/ POLYPECTOMY  03/27/2007; n7/19/2012   2008: 4 mm adenoma, diverticulosis 2012: ileitis ? NSAID - likely, 2-3 mm cecal polyp LYMPHOID FOLLICLE, diverticulosis  . ESOPHAGOGASTRODUODENOSCOPY  01/26/2011   reflux esophagitis, colonscopy done also  . HAMMER TOE SURGERY  2008   left foot  . HERNIA REPAIR Right 1996?  . IR  ANGIO INTRA EXTRACRAN SEL COM CAROTID INNOMINATE UNI R MOD SED  11/28/2018  . IR ANGIO INTRA EXTRACRAN SEL INTERNAL CAROTID UNI R MOD SED  12/30/2018  . IR ANGIO VERTEBRAL SEL SUBCLAVIAN INNOMINATE UNI R MOD SED  11/28/2018  . IR ANGIOGRAM FOLLOW UP STUDY  12/30/2018  . IR TRANSCATH/EMBOLIZ  12/30/2018  . IR US GUIDE VASC ACCESS RIGHT  11/28/2018  . ORIF ANKLE FRACTURE Right 12/22/2014   Procedure: OPEN REDUCTION INTERNAL FIXATION (ORIF) ANKLE FRACTURE;  Surgeon: Susa Day, MD;  Location: [facility];  Service: Orthopedics;  Laterality: Right;  . RADIOLOGY WITH ANESTHESIA N/A 12/30/2018   Procedure: RADIOLOGY WITH ANESTHESIA;  Surgeon: Luanne Bras, MD;  Location: [facility];  Service: Radiology;  Laterality: N/A;  . SHOULDER OPEN ROTATOR CUFF REPAIR Left 03/06/2013   Procedure: LEFT ROTATOR CUFF REPAIR, SUBACROMIAL DECOMPRESSION, PATCH GRAFT, MANIPULATION UNDER ANESTHESIA;  Surgeon: Johnn Hai, MD;  Location: [facility];  Service: Orthopedics;  Laterality: Left;    SOCIAL HISTORY: Social History   Socioeconomic History  . Marital status: Married    Spouse name: Not on file  . Number of children: 2  . Years of education: Not on file  . Highest education level: Not on file  Occupational History  . Occupation: retired  Tobacco Use  . Smoking status: Former Smoker    Packs/day: 2.00  Years: 30.00    Pack years: 60.00    Types: Cigarettes    Quit date: 07/10/1984    Years since quitting: 35.6  . Smokeless tobacco: Never Used  Vaping Use  . Vaping Use: Never used  Substance and Sexual Activity  . Alcohol use: No    Alcohol/week: 0.0 standard drinks    Comment: no alcoho since 1983  . Drug use: No  . Sexual activity: Not on file  Other Topics Concern  . Not on file  Social History Narrative   Home Situation: lives with husband. 2 daughters, 2 step daughters, 6 grandkids. 2 greatgrandkids    Work or School: very active in [employer] and church      Spiritual Beliefs: Darrick Meigs       Lifestyle:tries to walk; diet ok - denies any alcohol or tobacco use in many many years      Caffeine 1 cup coffee/day      Hobbies: news junkie, audio books   Social Determinants of Health   Financial Resource Strain: Low Risk   . Difficulty of Paying Living Expenses: Not hard at all  Food Insecurity: No Food Insecurity  . Worried About Charity fundraiser in the Last Year: Never true  . Ran Out of Food in the Last Year: Never true  Transportation Needs: No Transportation Needs  . Lack of Transportation (Medical): No  . Lack of Transportation (Non-Medical): No  Physical Activity:   . Days of Exercise per Week:   . Minutes of Exercise per Session:   Stress:   . Feeling of Stress :   Social Connections:   . Frequency of Communication with Friends and Family:   . Frequency of Social Gatherings with Friends and Family:   . Attends Religious Services:   . Active Member of Clubs or Organizations:   . Attends Archivist Meetings:   Marland Kitchen Marital Status:   Intimate Partner Violence:   . Fear of Current or Ex-Partner:   . Emotionally Abused:   Marland Kitchen Physically Abused:   . Sexually Abused:     FAMILY HISTORY: Family History  Problem Relation Age of Onset  . Throat cancer Mother        87  . Heart attack Father 26  . Idiopathic pulmonary fibrosis Brother 52  . Cancer Brother        sarcoma  . Heart disease Brother   . Colon cancer Neg Hx   . Esophageal cancer Neg Hx   . Stomach cancer Neg Hx     ALLERGIES:  is allergic to alcohol, codeine, and dilaudid [hydromorphone hcl].  MEDICATIONS:  Current Outpatient Medications  Medication Sig Dispense Refill  . clopidogrel (PLAVIX) 75 MG tablet Take 35.5 mg by mouth daily.    Marland Kitchen acetaminophen ([employer] ARTHRITIS PAIN RELIEF) 650 MG CR tablet Take 1,300 mg by mouth 2 (two) times daily.    Marland Kitchen albuterol (PROAIR HFA) 108 (90 Base) MCG/ACT inhaler INHALE 2 PUFFS EVERY 4 HOURS AS NEEDED FOR COUGHING SPELLS (Patient taking differently:  Inhale 2 puffs into the lungs every 4 (four) hours as needed (coughing spells). ) 8.5 Inhaler 2  . aspirin 325 MG tablet Take 1 tablet (325 mg total) by mouth daily. 30 tablet 3  . Coenzyme Q10 (COQ10 PO) Take 1 capsule by mouth daily.     . ferrous sulfate 325 (65 FE) MG tablet TAKE 1 TABLET BY MOUTH EVERY DAY (Patient taking differently: Take 325 mg by mouth daily with breakfast. )  90 tablet 3  . fexofenadine (ALLEGRA) 180 MG tablet Take 180 mg by mouth daily.    . fluticasone (FLONASE) 50 MCG/ACT nasal spray Place 2 sprays into both nostrils daily. 16 g 2  . hydrochlorothiazide (HYDRODIURIL) 25 MG tablet Take 1 tablet (25 mg total) by mouth daily. 90 tablet 3  . Multiple Vitamin (MULTIVITAMIN WITH MINERALS) TABS tablet Take 1 tablet by mouth daily.    . pantoprazole (PROTONIX) 20 MG tablet Take 1 tablet (20 mg total) by mouth daily. 90 tablet 3  . potassium chloride SA (KLOR-CON) 20 MEQ tablet Take 1 tablet (20 mEq total) by mouth daily. 90 tablet 3  . Propylene Glycol (SYSTANE BALANCE OP) Place 1 drop into both eyes every 6 (six) hours as needed (dry eyes).    . rosuvastatin (CRESTOR) 20 MG tablet Take 1 tablet (20 mg total) by mouth daily. (Patient not taking: Reported on 01/05/2020) 90 tablet 3  . Vitamin D, Cholecalciferol, 1000 units CAPS Take 1,000 Units by mouth daily.      No current facility-administered medications for this visit.    PHYSICAL EXAMINATION: ECOG PERFORMANCE STATUS: 0 - Asymptomatic  Vitals:   02/18/20 1239  BP: (!) 167/99  Pulse: 84  Resp: 18  Temp: (!) 97.5 F (36.4 C)  SpO2: 92%   Filed Weights   02/18/20 1239  Weight: 90.8 kg    GENERAL:alert, no distress and comfortable SKIN: skin color, texture, turgor are normal, no rashes or significant lesions  EYES: normal, Conjunctiva are pink and non-injected, sclera clear  NECK: supple, thyroid normal size, non-tender, without nodularity LYMPH:  no palpable lymphadenopathy in the cervical, axillary  LUNGS:  clear to auscultation and percussion with normal breathing effort HEART: regular rate & rhythm and no murmurs (+) Right lower extremity edema ABDOMEN:abdomen soft, non-tender and normal bowel sounds Musculoskeletal:no cyanosis of digits and no clubbing  NEURO: alert & oriented x 3 with fluent speech, no focal motor/sensory deficits BREAST: (+) Small hard lump at biopsy site of left breast. B/l breast exam benign.  LABORATORY DATA:  I have reviewed the data as listed CBC Latest Ref Rng & Units 02/18/2020 11/19/2019 05/22/2019  WBC 4.0 - 10.5 K/uL 9.4 7.8 8.8  Hemoglobin 12.0 - 15.0 g/dL 14.5 14.1 14.1  Hematocrit 36 - 46 % 43.9 41.6 42.8  Platelets 150 - 400 K/uL 287 282.0 318.0    CMP Latest Ref Rng & Units 02/18/2020 11/19/2019 05/22/2019  Glucose 70 - 99 mg/dL 149(H) 95 93  BUN 8 - 23 mg/dL 16 24(H) 21  Creatinine 0.44 - 1.00 mg/dL 0.85 0.80 0.82  Sodium 135 - 145 mmol/L 143 143 142  Potassium 3.5 - 5.1 mmol/L 3.7 3.8 3.8  Chloride 98 - 111 mmol/L 106 102 103  CO2 22 - 32 mmol/L 27 32 29  Calcium 8.9 - 10.3 mg/dL 10.3 10.2 9.9  Total Protein 6.5 - 8.1 g/dL 6.9 6.2 6.9  Total Bilirubin 0.3 - 1.2 mg/dL 0.7 0.6 0.5  Alkaline Phos 38 - 126 U/L 67 67 76  AST 15 - 41 U/L '20 16 17  ' ALT 0 - 44 U/L '14 12 14     ' RADIOGRAPHIC STUDIES: I have personally reviewed the radiological images as listed and agreed with the findings in the report. US BREAST LTD UNI LEFT INC AXILLA  Result Date: 01/30/2020 CLINICAL DATA:  85 year old patient recalled from recent screening mammogram for evaluation of possible distortion in the upper central left breast. EXAM: DIGITAL DIAGNOSTIC LEFT MAMMOGRAM WITH  CAD AND TOMO ULTRASOUND LEFT BREAST COMPARISON:  January 22, 2020 and earlier priors ACR Breast Density Category b: There are scattered areas of fibroglandular density. FINDINGS: There is persistent focal architectural distortion in the upper central left breast, middle third. This is seen on spot compression  views and a 90 degree lateral view. Mammographic images were processed with CAD. Targeted ultrasound is performed, showing an irregular hypoechoic mass with posterior acoustic shadowing at 12 o'clock position 3 cm from nipple. Mass measures approximately 0.6 x 0.4 x 0.6 cm. Ultrasound of the left axilla is negative for lymphadenopathy. IMPRESSION: 0.6 cm suspicious mass 12 o'clock axis left breast. RECOMMENDATION: Ultrasound-guided core needle biopsy is recommended and has been scheduled for the patient on August 2nd 2021. I have discussed the findings and recommendations with the patient. If applicable, a reminder letter will be sent to the patient regarding the next appointment. BI-RADS CATEGORY  4: Suspicious. Electronically Signed   By: Curlene Dolphin M.D.   On: 01/30/2020 14:46   MM DIAG BREAST TOMO UNI LEFT  Result Date: 01/30/2020 CLINICAL DATA:  85 year old patient recalled from recent screening mammogram for evaluation of possible distortion in the upper central left breast. EXAM: DIGITAL DIAGNOSTIC LEFT MAMMOGRAM WITH CAD AND TOMO ULTRASOUND LEFT BREAST COMPARISON:  January 22, 2020 and earlier priors ACR Breast Density Category b: There are scattered areas of fibroglandular density. FINDINGS: There is persistent focal architectural distortion in the upper central left breast, middle third. This is seen on spot compression views and a 90 degree lateral view. Mammographic images were processed with CAD. Targeted ultrasound is performed, showing an irregular hypoechoic mass with posterior acoustic shadowing at 12 o'clock position 3 cm from nipple. Mass measures approximately 0.6 x 0.4 x 0.6 cm. Ultrasound of the left axilla is negative for lymphadenopathy. IMPRESSION: 0.6 cm suspicious mass 12 o'clock axis left breast. RECOMMENDATION: Ultrasound-guided core needle biopsy is recommended and has been scheduled for the patient on August 2nd 2021. I have discussed the findings and recommendations with the  patient. If applicable, a reminder letter will be sent to the patient regarding the next appointment. BI-RADS CATEGORY  4: Suspicious. Electronically Signed   By: Curlene Dolphin M.D.   On: 01/30/2020 14:46   MM 3D SCREEN BREAST BILATERAL  Result Date: 01/26/2020 CLINICAL DATA:  Screening. EXAM: DIGITAL SCREENING BILATERAL MAMMOGRAM WITH TOMO AND CAD COMPARISON:  Previous exam(s). ACR Breast Density Category b: There are scattered areas of fibroglandular density. FINDINGS: In the left breast, possible distortion warrants further evaluation. In the right breast, no findings suspicious for malignancy. Images were processed with CAD. IMPRESSION: Further evaluation is suggested for possible distortion in the left breast. RECOMMENDATION: Diagnostic mammogram and possibly ultrasound of the left breast. (Code:[id number]) The patient will be contacted regarding the findings, and additional imaging will be scheduled. BI-RADS CATEGORY  0: Incomplete. Need additional imaging evaluation and/or prior mammograms for comparison. Electronically Signed   By: Ammie Ferrier M.D.   On: 01/26/2020 10:23   MM CLIP PLACEMENT LEFT  Result Date: 02/09/2020 CLINICAL DATA:  85 year old female status post ultrasound-guided biopsy of the left breast. EXAM: DIAGNOSTIC LEFT MAMMOGRAM POST ULTRASOUND BIOPSY COMPARISON:  Previous exam(s). FINDINGS: Mammographic images were obtained following ultrasound guided biopsy of the left breast. The biopsy marking clip is in expected position at the site of biopsy. IMPRESSION: Appropriate positioning of the ribbon shaped biopsy marking clip at the site of biopsy in the upper left breast. Final Assessment: Post Procedure Mammograms for Marker Placement  Electronically Signed   By: [name] M.D.   On: 02/09/2020 16:06   [place] LT BREAST BX W LOC DEV 1ST LESION IMG BX SPEC US GUIDE  Addendum Date: 02/10/2020   ADDENDUM REPORT: 02/10/2020 15:09 ADDENDUM: Pathology revealed GRADE II INVASIVE MAMMARY  CARCINOMA, MAMMARY CARCINOMA IN SITU of the LEFT breast, 12:30, 3cmfn. This was found to be concordant by Dr. [name]. Pathology results were discussed with the patient by telephone. The patient reported doing well after the biopsy with tenderness at the site. Post biopsy instructions and care were reviewed and questions were answered. The patient was encouraged to call [facility] for any additional concerns. The patient was referred to [facility] on February 18, 2020. Pathology results reported by Stacie Acres RN on 02/10/2020. Electronically Signed   By: [name] M.D.   On: 02/10/2020 15:09   Result Date: 02/10/2020 CLINICAL DATA:  85 year old female with a suspicious left breast mass. EXAM: ULTRASOUND GUIDED LEFT BREAST CORE NEEDLE BIOPSY COMPARISON:  Previous exam(s). PROCEDURE: I met with the patient and we discussed the procedure of ultrasound-guided biopsy, including benefits and alternatives. We discussed the high likelihood of a successful procedure. We discussed the risks of the procedure, including infection, bleeding, tissue injury, clip migration, and inadequate sampling. Informed written consent was given. The usual time-out protocol was performed immediately prior to the procedure. Lesion quadrant: Upper outer quadrant Using sterile technique and 1% Lidocaine as local anesthetic, under direct ultrasound visualization, a 12 gauge spring-loaded device was used to perform biopsy of a mass at the 12 o'clock position using a lateral approach. At the conclusion of the procedure ribbon shaped tissue marker clip was deployed into the biopsy cavity. Follow up 2 view mammogram was performed and dictated separately. IMPRESSION: Ultrasound guided biopsy of the left breast. No apparent complications. Electronically Signed: By: [name] M.D. On: 02/09/2020 16:05    ASSESSMENT & PLAN:  Sheila Shields is a 85 y.o. Caucasian female with a history of Arthritis, COPD, Diverticulosis, GERD, HTN, HLD, anxiety and depression, h/o stroke   1. Malignant neoplasm of upper-outer quadrant of left breast, Stage IA, c(T1bN0M0), lobular carcinoma, ER+/PR+/HER2-, Grade II -We discussed her image findings and the biopsy results in great details. She was found to have 25m mass in her left breast with invasive lobular carcinoma with components of LCIS.  -Given the early stage disease, she likely need a left lumpectomy. She is agreeable with that. She was seen by Dr. BBarry Dienestoday and likely will proceed with surgery soon.  -The risk of recurrence depends on the stage and biology of the tumor. She is early stage, with ER/PR positive and HER2 negative markers. I discussed this is likely slow growing tumor.  -Given her early stage disease and her advanced age, I do not recommend chemotherapy.  -She was also seen by radiation oncologist Dr. MLisbeth Renshawtoday. Radiation was diacussed with her and she is interested.  -Given the strong ER and PR expression, I recommend adjuvant endocrine therapy with Tamoxifen (given her arthritis) for a total of 5 years to reduce the risk of cancer recurrence. Potential benefits and side effects were discussed with patient. I reviewed management of her weight, diet and exercise with her.  -She agreed with Radiation and Antiestrogen therapy. Given her adequate health at her age, she should tolerate well.  -We also discussed the breast cancer surveillance after her surgery.  She will continue annual screening mammogram, self exams, and a routine office visit with lab and exam with [place].  -Labs reviewed, CBC and CMP WNL BG 149. Physical exam shows possible hematoma at biopsy site of left breast, overall benign breast exam.  -F/u after Surgery and Radiation.     2. Comorbidities: Brain Aneurysm, HTN, HLD, GERD, COPD, Osteoarthritis -Her brain aneurysm was banded in 12/2018. She continues to  f/u with Dr Vickey Sages. She is on 370m aspirin.  -She did have very mild TIA and is seen by Dr JTana Felts She is also on half tablet Plavix due to easy bruising.  -On HCTZ, Protonix, Crestor. Her COPD is well controlled.  -She has OA in her hands and back along with herniated disc, scoliosis and spinal stenosis. This is controlled on Tylenol.   3. H/o of alcohol and nicotine use.  -She is a recovering alcoholic after 32 years and quit in 1983. She was also a heavy smoker and quit smoking in 1996.   PLAN:  -Proceed with lumpectomy surgery  -F/u after radiation    No orders of the defined types were placed in this encounter.   All questions were answered. The patient knows to call the clinic with any problems, questions or concerns. The total time spent in the appointment was 40 minutes.     YTruitt Merle MD 02/18/2020   I, AJoslyn Devon am acting as scribe for YTruitt Merle MD.   I have reviewed the above documentation for accuracy and completeness, and I agree with the above.

## 2020-02-18 ENCOUNTER — Encounter: Payer: Self-pay | Admitting: *Deleted

## 2020-02-18 ENCOUNTER — Other Ambulatory Visit: Payer: Self-pay

## 2020-02-18 ENCOUNTER — Encounter: Payer: Self-pay | Admitting: General Practice

## 2020-02-18 ENCOUNTER — Inpatient Hospital Stay: Payer: Medicare HMO | Attending: Hematology | Admitting: Hematology

## 2020-02-18 ENCOUNTER — Ambulatory Visit: Payer: Medicare HMO | Admitting: Physical Therapy

## 2020-02-18 ENCOUNTER — Encounter: Payer: Self-pay | Admitting: Hematology

## 2020-02-18 ENCOUNTER — Inpatient Hospital Stay: Payer: Medicare HMO

## 2020-02-18 ENCOUNTER — Ambulatory Visit
Admission: RE | Admit: 2020-02-18 | Discharge: 2020-02-18 | Disposition: A | Discharge status: Home / Self Care | Payer: Medicare HMO | Source: Ambulatory Visit | Attending: Radiation Oncology | Admitting: Radiation Oncology

## 2020-02-18 ENCOUNTER — Other Ambulatory Visit: Payer: Self-pay | Admitting: General Surgery

## 2020-02-18 VITALS — BP 167/99 | HR 84 | Temp 97.5°F | Resp 18 | Ht 65.5 in | Wt 200.2 lb

## 2020-02-18 DIAGNOSIS — R69 Illness, unspecified: Secondary | ICD-10-CM | POA: Diagnosis not present

## 2020-02-18 DIAGNOSIS — Z79899 Other long term (current) drug therapy: Secondary | ICD-10-CM | POA: Insufficient documentation

## 2020-02-18 DIAGNOSIS — C50412 Malignant neoplasm of upper-outer quadrant of left female breast: Secondary | ICD-10-CM

## 2020-02-18 DIAGNOSIS — Z170 Estrogen receptor positive status [ER+]: Secondary | ICD-10-CM | POA: Insufficient documentation

## 2020-02-18 DIAGNOSIS — Z8673 Personal history of transient ischemic attack (TIA), and cerebral infarction without residual deficits: Secondary | ICD-10-CM | POA: Diagnosis not present

## 2020-02-18 DIAGNOSIS — E785 Hyperlipidemia, unspecified: Secondary | ICD-10-CM | POA: Insufficient documentation

## 2020-02-18 DIAGNOSIS — Z7982 Long term (current) use of aspirin: Secondary | ICD-10-CM | POA: Diagnosis not present

## 2020-02-18 DIAGNOSIS — I10 Essential (primary) hypertension: Secondary | ICD-10-CM | POA: Diagnosis not present

## 2020-02-18 DIAGNOSIS — M1990 Unspecified osteoarthritis, unspecified site: Secondary | ICD-10-CM | POA: Diagnosis not present

## 2020-02-18 DIAGNOSIS — Z86010 Personal history of colonic polyps: Secondary | ICD-10-CM | POA: Insufficient documentation

## 2020-02-18 DIAGNOSIS — J449 Chronic obstructive pulmonary disease, unspecified: Secondary | ICD-10-CM | POA: Diagnosis not present

## 2020-02-18 DIAGNOSIS — I671 Cerebral aneurysm, nonruptured: Secondary | ICD-10-CM | POA: Diagnosis not present

## 2020-02-18 DIAGNOSIS — K219 Gastro-esophageal reflux disease without esophagitis: Secondary | ICD-10-CM | POA: Diagnosis not present

## 2020-02-18 DIAGNOSIS — Z87891 Personal history of nicotine dependence: Secondary | ICD-10-CM | POA: Insufficient documentation

## 2020-02-18 DIAGNOSIS — F1021 Alcohol dependence, in remission: Secondary | ICD-10-CM | POA: Insufficient documentation

## 2020-02-18 DIAGNOSIS — Z800 Family history of malignant neoplasm of digestive organs: Secondary | ICD-10-CM | POA: Diagnosis not present

## 2020-02-18 LAB — CBC WITH DIFFERENTIAL (CANCER CENTER ONLY)
Abs Immature Granulocytes: 0.02 10*3/uL (ref 0.00–0.07)
Basophils Absolute: 0.1 10*3/uL (ref 0.0–0.1)
Basophils Relative: 1 %
Eosinophils Absolute: 0.5 10*3/uL (ref 0.0–0.5)
Eosinophils Relative: 5 %
HCT: 43.9 % (ref 36.0–46.0)
Hemoglobin: 14.5 g/dL (ref 12.0–15.0)
Immature Granulocytes: 0 %
Lymphocytes Relative: 15 %
Lymphs Abs: 1.4 10*3/uL (ref 0.7–4.0)
MCH: 30.5 pg (ref 26.0–34.0)
MCHC: 33 g/dL (ref 30.0–36.0)
MCV: 92.4 fL (ref 80.0–100.0)
Monocytes Absolute: 0.9 10*3/uL (ref 0.1–1.0)
Monocytes Relative: 9 %
Neutro Abs: 6.5 10*3/uL (ref 1.7–7.7)
Neutrophils Relative %: 70 %
Platelet Count: 287 10*3/uL (ref 150–400)
RBC: 4.75 MIL/uL (ref 3.87–5.11)
RDW: 13.2 % (ref 11.5–15.5)
WBC Count: 9.4 10*3/uL (ref 4.0–10.5)
nRBC: 0 % (ref 0.0–0.2)

## 2020-02-18 LAB — CMP (CANCER CENTER ONLY)
ALT: 14 U/L (ref 0–44)
AST: 20 U/L (ref 15–41)
Albumin: 3.8 g/dL (ref 3.5–5.0)
Alkaline Phosphatase: 67 U/L (ref 38–126)
Anion gap: 10 (ref 5–15)
BUN: 16 mg/dL (ref 8–23)
CO2: 27 mmol/L (ref 22–32)
Calcium: 10.3 mg/dL (ref 8.9–10.3)
Chloride: 106 mmol/L (ref 98–111)
Creatinine: 0.85 mg/dL (ref 0.44–1.00)
GFR, Est AFR Am: >60 mL/min (ref >60)
GFR, Estimated: >60 mL/min (ref >60)
Glucose, Bld: 149 mg/dL — ABNORMAL HIGH (ref 70–99)
Potassium: 3.7 mmol/L (ref 3.5–5.1)
Sodium: 143 mmol/L (ref 135–145)
Total Bilirubin: 0.7 mg/dL (ref 0.3–1.2)
Total Protein: 6.9 g/dL (ref 6.5–8.1)

## 2020-02-18 LAB — GENETIC SCREENING ORDER

## 2020-02-18 NOTE — Progress Notes (Signed)
Radiation Oncology         [phone] ________________________________  Name: Sheila Shields        MRN: [id number]  Date of Service: 02/18/2020 DOB: 1934/12/22  SL:HTDSKA, Brayton Mars, MD  [name], MD     REFERRING PHYSICIAN: [name], MD   DIAGNOSIS: The encounter diagnosis was Malignant neoplasm of upper-outer quadrant of left breast in female, estrogen receptor positive ([place]).   HISTORY OF PRESENT ILLNESS: Sheila Shields is a 85 y.o. female seen in the multidisciplinary breast clinic for a new diagnosis of left breast cancer. The patient was noted to have screening detected distortion in the upper outer quadrant of the left breast.  Upon further diagnostic study at around 12:00 there was a 6 x 6 x 4 mm area of abnormality within the left breast, her axilla on that side was negative.  She did undergo a biopsy on 02/09/2020 which revealed an invasive lobular carcinoma, grade 2 with associated LCIS.  Her tumor was ER/PR positive, HER-2 negative with a Ki-67 of 15%.  She is seen today to discuss treatment recommendations of her cancer.    PREVIOUS RADIATION THERAPY: No   PAST MEDICAL HISTORY:  Past Medical History:  Diagnosis Date  . Alcoholism in remission ([facility]) 08/27/2007  . Anemia   . Arthritis    OA  . COPD 12/17/2006  . Diverticulosis 03/27/2007  . GERD 05/01/2007  . HYPERGLYCEMIA 08/27/2007  . HYPERLIPIDEMIA 12/17/2006  . HYPERTENSION 12/17/2006  . Mood disorder ([facility])    hx anxiety and depression. meds short term during life transition  . Pneumonia    AS A CHILD  . Rectal polyp 03/27/2007   adenoma  . TIA (transient ischemic attack)        PAST SURGICAL HISTORY: Past Surgical History:  Procedure Laterality Date  . ABDOMINAL HYSTERECTOMY  1978   fibroma  . APPENDECTOMY  2002  . BREAST EXCISIONAL BIOPSY Right 2003   negative  . CARPAL TUNNEL RELEASE Left 2010 or 2011  . COLONOSCOPY W/ POLYPECTOMY  03/27/2007; n7/19/2012   2008: 4 mm adenoma, diverticulosis 2012:  ileitis ? NSAID - likely, 2-3 mm cecal polyp LYMPHOID FOLLICLE, diverticulosis  . ESOPHAGOGASTRODUODENOSCOPY  01/26/2011   reflux esophagitis, colonscopy done also  . HAMMER TOE SURGERY  2008   left foot  . HERNIA REPAIR Right 1996?  . IR ANGIO INTRA EXTRACRAN SEL COM CAROTID INNOMINATE UNI R MOD SED  11/28/2018  . IR ANGIO INTRA EXTRACRAN SEL INTERNAL CAROTID UNI R MOD SED  12/30/2018  . IR ANGIO VERTEBRAL SEL SUBCLAVIAN INNOMINATE UNI R MOD SED  11/28/2018  . IR ANGIOGRAM FOLLOW UP STUDY  12/30/2018  . IR TRANSCATH/EMBOLIZ  12/30/2018  . IR US GUIDE VASC ACCESS RIGHT  11/28/2018  . ORIF ANKLE FRACTURE Right 12/22/2014   Procedure: OPEN REDUCTION INTERNAL FIXATION (ORIF) ANKLE FRACTURE;  Surgeon: Susa Day, MD;  Location: [facility];  Service: Orthopedics;  Laterality: Right;  . RADIOLOGY WITH ANESTHESIA N/A 12/30/2018   Procedure: RADIOLOGY WITH ANESTHESIA;  Surgeon: Luanne Bras, MD;  Location: [facility];  Service: Radiology;  Laterality: N/A;  . SHOULDER OPEN ROTATOR CUFF REPAIR Left 03/06/2013   Procedure: LEFT ROTATOR CUFF REPAIR, SUBACROMIAL DECOMPRESSION, PATCH GRAFT, MANIPULATION UNDER ANESTHESIA;  Surgeon: Johnn Hai, MD;  Location: [facility];  Service: Orthopedics;  Laterality: Left;     FAMILY HISTORY:  Family History  Problem Relation Age of Onset  . Throat cancer Mother  [place] attack Father 46  . Idiopathic pulmonary fibrosis Brother 16  . Cancer Brother        sarcoma  . Heart disease Brother   . Colon cancer Neg Hx   . Esophageal cancer Neg Hx   . Stomach cancer Neg Hx      SOCIAL HISTORY:  reports that she quit smoking about 35 years ago. Her smoking use included cigarettes. She has a 60.00 pack-year smoking history. She has never used smokeless tobacco. She reports that she does not drink alcohol and does not use drugs. The patient is married and lives in [place]. She has 4 adult daughters. She enjoys Firefighter. She's accompanied by her husband today.     ALLERGIES: Alcohol, Codeine, and Dilaudid [hydromorphone hcl]   MEDICATIONS:  Current Outpatient Medications  Medication Sig Dispense Refill  . acetaminophen ([employer] ARTHRITIS PAIN RELIEF) 650 MG CR tablet Take 1,300 mg by mouth 2 (two) times daily.    Marland Kitchen albuterol (PROAIR HFA) 108 (90 Base) MCG/ACT inhaler INHALE 2 PUFFS EVERY 4 HOURS AS NEEDED FOR COUGHING SPELLS (Patient taking differently: Inhale 2 puffs into the lungs every 4 (four) hours as needed (coughing spells). ) 8.5 Inhaler 2  . aspirin 325 MG tablet Take 1 tablet (325 mg total) by mouth daily. 30 tablet 3  . clopidogrel (PLAVIX) 75 MG tablet TAKE ONE TABLET BY MOUTH DAILY (Patient taking differently: Take 35.5 mg by mouth daily. ) 90 tablet 1  . Coenzyme Q10 (COQ10 PO) Take 1 capsule by mouth daily.     . diclofenac Sodium (VOLTAREN) 1 % GEL diclofenac 1 % topical gel    . doxycycline (VIBRA-TABS) 100 MG tablet Take 1 tablet (100 mg total) by mouth 2 (two) times daily. 20 tablet 0  . ferrous sulfate 325 (65 FE) MG tablet TAKE 1 TABLET BY MOUTH EVERY DAY (Patient taking differently: Take 325 mg by mouth daily with breakfast. ) 90 tablet 3  . fexofenadine (ALLEGRA) 180 MG tablet Take 180 mg by mouth daily.    . fluticasone (FLONASE) 50 MCG/ACT nasal spray Place 2 sprays into both nostrils daily. 16 g 2  . hydrochlorothiazide (HYDRODIURIL) 25 MG tablet Take 1 tablet (25 mg total) by mouth daily. 90 tablet 3  . hydrocortisone 2.5 % cream Apply 1 application topically 2 (two) times daily as needed. Inflammation/arthritis    . Multiple Vitamin (MULTIVITAMIN WITH MINERALS) TABS tablet Take 1 tablet by mouth daily.    . pantoprazole (PROTONIX) 20 MG tablet Take 1 tablet (20 mg total) by mouth daily. 90 tablet 3  . potassium chloride SA (KLOR-CON) 20 MEQ tablet Take 1 tablet (20 mEq total) by mouth daily. 90 tablet 3  . Propylene Glycol (SYSTANE BALANCE OP) Place 1 drop into both eyes every 6 (six) hours as needed (dry eyes).    .  rosuvastatin (CRESTOR) 20 MG tablet Take 1 tablet (20 mg total) by mouth daily. (Patient not taking: Reported on 01/05/2020) 90 tablet 3  . traMADol (ULTRAM) 50 MG tablet Take 50 mg by mouth every 6 (six) hours as needed.    . Vitamin D, Cholecalciferol, 1000 units CAPS Take 1,000 Units by mouth daily.      No current facility-administered medications for this encounter.     REVIEW OF SYSTEMS: On review of systems, the patient reports that she is doing well overall. No specific complaints are otherwise noted.    PHYSICAL EXAM:  Wt Readings from Last 3 Encounters:  01/05/20 199  lb (90.3 kg)  11/19/19 199 lb (90.3 kg)  08/25/19 194 lb (88 kg)   Temp Readings from Last 3 Encounters:  11/19/19 (!) 97.2 F (36.2 C) (Temporal)  08/11/19 (!) 97.3 F (36.3 C)  05/22/19 98.6 F (37 C) (Temporal)   BP Readings from Last 3 Encounters:  01/05/20 129/81  11/19/19 120/78  05/22/19 136/78   Pulse Readings from Last 3 Encounters:  01/05/20 77  11/19/19 (!) 56  05/22/19 68    In general this is a well appearing caucasian female in no acute distress. She's alert and oriented x4 and appropriate throughout the examination. Cardiopulmonary assessment is negative for acute distress and she exhibits normal effort. Bilateral breast exam is deferred.    ECOG = 0  0 - Asymptomatic (Fully active, able to carry on all predisease activities without restriction)  1 - Symptomatic but completely ambulatory (Restricted in physically strenuous activity but ambulatory and able to carry out work of a light or sedentary nature. For example, light housework, office work)  2 - Symptomatic, <50% in bed during the day (Ambulatory and capable of all self care but unable to carry out any work activities. Up and about more than 50% of waking hours)  3 - Symptomatic, >50% in bed, but not bedbound (Capable of only limited self-care, confined to bed or chair 50% or more of waking hours)  4 - Bedbound (Completely  disabled. Cannot carry on any self-care. Totally confined to bed or chair)  5 - Death   Eustace Pen MM, Creech RH, Tormey DC, et al. [phone]). "Toxicity and response criteria of the [facility]". [place] Oncol. 5 (6): 649-55    LABORATORY DATA:  Lab Results  Component Value Date   WBC 7.8 11/19/2019   HGB 14.1 11/19/2019   HCT 41.6 11/19/2019   MCV 93.3 11/19/2019   PLT 282.0 11/19/2019   Lab Results  Component Value Date   NA 143 11/19/2019   K 3.8 11/19/2019   CL 102 11/19/2019   CO2 32 11/19/2019   Lab Results  Component Value Date   ALT 12 11/19/2019   AST 16 11/19/2019   ALKPHOS 67 11/19/2019   BILITOT 0.6 11/19/2019      RADIOGRAPHY: US BREAST LTD UNI LEFT INC AXILLA  Result Date: 01/30/2020 CLINICAL DATA:  85 year old patient recalled from recent screening mammogram for evaluation of possible distortion in the upper central left breast. EXAM: DIGITAL DIAGNOSTIC LEFT MAMMOGRAM WITH CAD AND TOMO ULTRASOUND LEFT BREAST COMPARISON:  January 22, 2020 and earlier priors ACR Breast Density Category b: There are scattered areas of fibroglandular density. FINDINGS: There is persistent focal architectural distortion in the upper central left breast, middle third. This is seen on spot compression views and a 90 degree lateral view. Mammographic images were processed with CAD. Targeted ultrasound is performed, showing an irregular hypoechoic mass with posterior acoustic shadowing at 12 o'clock position 3 cm from nipple. Mass measures approximately 0.6 x 0.4 x 0.6 cm. Ultrasound of the left axilla is negative for lymphadenopathy. IMPRESSION: 0.6 cm suspicious mass 12 o'clock axis left breast. RECOMMENDATION: Ultrasound-guided core needle biopsy is recommended and has been scheduled for the patient on August 2nd 2021. I have discussed the findings and recommendations with the patient. If applicable, a reminder letter will be sent to the patient regarding the next  appointment. BI-RADS CATEGORY  4: Suspicious. Electronically Signed   By: Curlene Dolphin M.D.   On: 01/30/2020 14:46   MM DIAG BREAST  TOMO UNI LEFT  Result Date: 01/30/2020 CLINICAL DATA:  85 year old patient recalled from recent screening mammogram for evaluation of possible distortion in the upper central left breast. EXAM: DIGITAL DIAGNOSTIC LEFT MAMMOGRAM WITH CAD AND TOMO ULTRASOUND LEFT BREAST COMPARISON:  January 22, 2020 and earlier priors ACR Breast Density Category b: There are scattered areas of fibroglandular density. FINDINGS: There is persistent focal architectural distortion in the upper central left breast, middle third. This is seen on spot compression views and a 90 degree lateral view. Mammographic images were processed with CAD. Targeted ultrasound is performed, showing an irregular hypoechoic mass with posterior acoustic shadowing at 12 o'clock position 3 cm from nipple. Mass measures approximately 0.6 x 0.4 x 0.6 cm. Ultrasound of the left axilla is negative for lymphadenopathy. IMPRESSION: 0.6 cm suspicious mass 12 o'clock axis left breast. RECOMMENDATION: Ultrasound-guided core needle biopsy is recommended and has been scheduled for the patient on August 2nd 2021. I have discussed the findings and recommendations with the patient. If applicable, a reminder letter will be sent to the patient regarding the next appointment. BI-RADS CATEGORY  4: Suspicious. Electronically Signed   By: Curlene Dolphin M.D.   On: 01/30/2020 14:46   MM 3D SCREEN BREAST BILATERAL  Result Date: 01/26/2020 CLINICAL DATA:  Screening. EXAM: DIGITAL SCREENING BILATERAL MAMMOGRAM WITH TOMO AND CAD COMPARISON:  Previous exam(s). ACR Breast Density Category b: There are scattered areas of fibroglandular density. FINDINGS: In the left breast, possible distortion warrants further evaluation. In the right breast, no findings suspicious for malignancy. Images were processed with CAD. IMPRESSION: Further evaluation is  suggested for possible distortion in the left breast. RECOMMENDATION: Diagnostic mammogram and possibly ultrasound of the left breast. (Code:[id number]) The patient will be contacted regarding the findings, and additional imaging will be scheduled. BI-RADS CATEGORY  0: Incomplete. Need additional imaging evaluation and/or prior mammograms for comparison. Electronically Signed   By: Ammie Ferrier M.D.   On: 01/26/2020 10:23   MM CLIP PLACEMENT LEFT  Result Date: 02/09/2020 CLINICAL DATA:  85 year old female status post ultrasound-guided biopsy of the left breast. EXAM: DIAGNOSTIC LEFT MAMMOGRAM POST ULTRASOUND BIOPSY COMPARISON:  Previous exam(s). FINDINGS: Mammographic images were obtained following ultrasound guided biopsy of the left breast. The biopsy marking clip is in expected position at the site of biopsy. IMPRESSION: Appropriate positioning of the ribbon shaped biopsy marking clip at the site of biopsy in the upper left breast. Final Assessment: Post Procedure Mammograms for Marker Placement Electronically Signed   By: [name] M.D.   On: 02/09/2020 16:06   [place] LT BREAST BX W LOC DEV 1ST LESION IMG BX SPEC US GUIDE  Addendum Date: 02/10/2020   ADDENDUM REPORT: 02/10/2020 15:09 ADDENDUM: Pathology revealed GRADE II INVASIVE MAMMARY CARCINOMA, MAMMARY CARCINOMA IN SITU of the LEFT breast, 12:30, 3cmfn. This was found to be concordant by Dr. [name]. Pathology results were discussed with the patient by telephone. The patient reported doing well after the biopsy with tenderness at the site. Post biopsy instructions and care were reviewed and questions were answered. The patient was encouraged to call [facility] for any additional concerns. The patient was referred to [facility] on February 18, 2020. Pathology results reported by Stacie Acres RN on 02/10/2020. Electronically Signed   By: [name] M.D.   On: 02/10/2020 15:09   Result Date: 02/10/2020 CLINICAL DATA:  85 year old female with a suspicious left breast  mass. EXAM: ULTRASOUND GUIDED LEFT BREAST CORE NEEDLE BIOPSY COMPARISON:  Previous exam(s). PROCEDURE: I met with the patient and we discussed the procedure of ultrasound-guided biopsy, including benefits and alternatives. We discussed the high likelihood of a successful procedure. We discussed the risks of the procedure, including infection, bleeding, tissue injury, clip migration, and inadequate sampling. Informed written consent was given. The usual time-out protocol was performed immediately prior to the procedure. Lesion quadrant: Upper outer quadrant Using sterile technique and 1% Lidocaine as local anesthetic, under direct ultrasound visualization, a 12 gauge spring-loaded device was used to perform biopsy of a mass at the 12 o'clock position using a lateral approach. At the conclusion of the procedure ribbon shaped tissue marker clip was deployed into the biopsy cavity. Follow up 2 view mammogram was performed and dictated separately. IMPRESSION: Ultrasound guided biopsy of the left breast. No apparent complications. Electronically Signed: By: [name] M.D. On: 02/09/2020 16:05       IMPRESSION/PLAN: 1. Stage IA, [id number] grade 2 ER/PR invasive lobular carcinoma of the left breast. Dr. Lisbeth Renshaw discusses the pathology findings and reviews the nature of left breast disease. The consensus from the breast conference includes breast conservation with lumpectomy. We discussed the options of external radiotherapy to the breast followed by antiestrogen therapy. The patient is interested rather in being aggressive for her treatment and would be willing to proceed with radiotherapy. We discussed the risks, benefits, short, and long term effects of radiotherapy. Dr. Lisbeth Renshaw discusses the delivery and logistics of radiotherapy and anticipates a course of 4- 6 1/2weeks of radiotherapy to  the left breast with deep inspiration breath-hold technique. We will see her back about 2 weeks after surgery to discuss the simulation process and anticipate we starting radiotherapy about 4-6 weeks after surgery.    In a visit lasting 60 minutes, greater than 50% of the time was spent face to face reviewing her case, as well as in preparation of, discussing, and coordinating the patient's care.  The above documentation reflects my direct findings during this shared patient visit. Please see the separate note by Dr. Lisbeth Renshaw on this date for the remainder of the patient's plan of care.    Carola Rhine, PAC

## 2020-02-18 NOTE — Progress Notes (Signed)
[facility] Spiritual Care Note  Met Ms Garmany and her husband Ed in [facility], introducing [facility] team and programming. Ms Tristan describes herself as "a free woman" after learning her plan of care. She and Ed were in good spirits and report strong support from family, friends, and community. Perspective is a key coping tool. Per couple, no other needs or concerns at this time, but they are aware of ongoing Team availability should needs arise or circumstances change.   [place], [place], [facility] Pager [phone] Voicemail [phone]

## 2020-02-19 NOTE — Progress Notes (Signed)
NEUROLOGY FOLLOW UP OFFICE NOTE  Sheila Shields [id number]  HISTORY OF PRESENT ILLNESS: Sheila Shields is an 85 year old womanwith COPD, hypertension, hyperlipidemia, arthritis, alcoholism in remission and ocular migraineswho presents for transient amnesia.  She is accompanied by her husband who supplements history.  UPDATE: Current medications:  ASA 325mg ; Plavix 37.5mg  daily (reduced from 75mg  due to bruising); simvastatin 40mg ; Zetia 10mg   MRA of head on 12/07/2019 personally reviewed showed stent of right supraclinoid internal carotid artery extending to the right MCA with no flow-related signal of aneurysm.  She was diagnosed with breast cancer and will need to undergo a lumpectomy.  I sent a letter to her surgeon clearing her from a neurologic perspective and agreeable to plan to discontinue Plavix 5 days prior to surgery and instead will take ASA daily until the day prior to surgery.    HISTORY: She has longstanding history of ocular migraines presenting as wavy lines in her vision. She had increased frequency of ocular migraines for a few days in March 2020. She was evaluated by ophthalmology with unremarkable exam. , she developed a dull nonthrobbing headache across the forehead and down the left side of her face, ear and neck. A few days later, she was relaxing in her recliner when she developed left sided facial numbness lasting less than 5 minutes. 45 minutes later, her left hand became numb and contracted into a fist. She couldn't open her fist. This lasted less than 5 minutes. There was no associated visual disturbance, facial droop, slurred speech or unilateral pain or weakness of the arm and hand. CT of head without contrast performed on 10/21/2018 demonstrated mild chronic small vessel ischemic changes and atrophy but no acute intracranial abnormality.Later that evening, her left arm and hand became numb for a couple of minutes. No recurrent episodes today but  continues to have mild headache and left sided neck pain.  She underwent a TIA workup: MRI of brain from 11/05/18 personally reviewed and demonstrated age-related atrophy and mild chronic small vessel ischemic changes but no acute or subacute infarct. 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 Direct LDL from 01/21/19 was 94. She was advised to increase atorvastatin from 20mg  to 40mg  daily, however it caused myalgias. She was changed back to to simvastatin  She saw Dr. Estanislado Pandy of IR for the Right ICA intracranial aneurysm and underwent endovascular embolization in June.Since the procedure, she has had a dull persistent headache.  She really only notices it when she is resting.  She doesn't pay attention to it if she is active and preoccupied.  On 02/26/19, she and her husband were having lunch with friends when she suddenly realized she was referring to her current husband by her first husband's name. It was a strange feeling of being taken back in time. No headache, loss of awareness, phantosmia, gastric uprising, slurred speech, facial droop, dizziness or unilateral numbness or weakness. This lasted up to 5 minutes at the most. She reports a similar event 20 years ago at her doctor's office who diagnosed her with transient global amnesia. However, that episode lasted about 45 minutes.  Due to recurrence of transient global amnesia, she had an EEG on 04/07/2019 which was normal awake  and asleep.  She underwent a repeat MRI of brain on 05/27/2019 which was personally reviewed and was stable with no acute intracranial abnormality.  MRA of head showed interval treatment of  supraclinoid right ICA aneurysm but otherwise unremarkable.  Since her procedure, Dr. Estanislado Pandy has stated that she needs to be on ASA 325mg  daily in addition to Plavix.  She reports bruising and wants to know if the ASA can at least be reduced to 81mg .  She has not had any recurrent TGA.  Still with dull headache but manageable.  Would prefer avoiding medications.  PAST MEDICAL HISTORY: Past Medical History:  Diagnosis Date  . Alcoholism in remission ([place]) 08/27/2007  . Anemia   . Arthritis    OA  . COPD 12/17/2006  . Diverticulosis 03/27/2007  . GERD 05/01/2007  . HYPERGLYCEMIA 08/27/2007  . HYPERLIPIDEMIA 12/17/2006  . HYPERTENSION 12/17/2006  . Mood disorder ([facility])    hx anxiety and depression. meds short term during life transition  . Pneumonia    AS A CHILD  . Rectal polyp 03/27/2007   adenoma  . TIA (transient ischemic attack)     MEDICATIONS: Current Outpatient Medications on File Prior to Visit  Medication Sig Dispense Refill  . acetaminophen ([employer] ARTHRITIS PAIN RELIEF) 650 MG CR tablet Take 1,300 mg by mouth 2 (two) times daily.    Marland Kitchen albuterol (PROAIR HFA) 108 (90 Base) MCG/ACT inhaler INHALE 2 PUFFS EVERY 4 HOURS AS NEEDED FOR COUGHING SPELLS (Patient taking differently: Inhale 2 puffs into the lungs every 4 (four) hours as needed (coughing spells). ) 8.5 Inhaler 2  . aspirin 325 MG tablet Take 1 tablet (325 mg total) by mouth daily. 30 tablet 3  . clopidogrel (PLAVIX) 75 MG tablet Take 35.5 mg by mouth daily.    . Coenzyme Q10 (COQ10 PO) Take 1 capsule by mouth daily.     . ferrous sulfate 325 (65 FE) MG tablet TAKE 1 TABLET BY MOUTH EVERY DAY (Patient taking differently: Take 325 mg by mouth daily with breakfast. ) 90 tablet 3  . fexofenadine (ALLEGRA) 180 MG tablet Take 180 mg by mouth daily.    . fluticasone (FLONASE) 50 MCG/ACT nasal spray Place 2 sprays into both nostrils daily. 16 g 2  . hydrochlorothiazide (HYDRODIURIL) 25 MG tablet Take 1 tablet (25 mg total) by mouth  daily. 90 tablet 3  . Multiple Vitamin (MULTIVITAMIN WITH MINERALS) TABS tablet Take 1 tablet by mouth daily.    . pantoprazole (PROTONIX) 20 MG tablet Take 1 tablet (20 mg total) by mouth daily. 90 tablet 3  . potassium chloride SA (KLOR-CON) 20 MEQ tablet Take 1 tablet (20 mEq total) by mouth daily. 90 tablet 3  . Propylene Glycol (SYSTANE BALANCE OP) Place 1 drop into both eyes every 6 (six) hours as needed (dry eyes).    . rosuvastatin (CRESTOR) 20 MG tablet Take 1 tablet (20 mg total) by mouth daily. (Patient not taking: Reported on 01/05/2020) 90 tablet 3  . Vitamin D, Cholecalciferol, 1000 units CAPS Take 1,000 Units by mouth daily.      No current facility-administered medications on file prior to visit.    ALLERGIES: Allergies  Allergen Reactions  . Alcohol Other (See Comments)    Recovering Alcoholic*  . Codeine     Patient prefers not to take this due to history of alcoholism  . Dilaudid [Hydromorphone Hcl]     Pt had hypotension after dilaudid 1mg  IV while in the OR, required temporary pressor intervention.  FAMILY HISTORY: Family History  Problem Relation Age of Onset  . Throat cancer Mother        33  . Heart attack Father 36  . Idiopathic pulmonary fibrosis Brother 62  . Cancer Brother        sarcoma  . Heart disease Brother   . Colon cancer Neg Hx   . Esophageal cancer Neg Hx   . Stomach cancer Neg Hx     SOCIAL HISTORY: Social History   Socioeconomic History  . Marital status: Married    Spouse name: Not on file  . Number of children: 2  . Years of education: Not on file  . Highest education level: Not on file  Occupational History  . Occupation: retired  Tobacco Use  . Smoking status: Former Smoker    Packs/day: 2.00    Years: 30.00    Pack years: 60.00    Types: Cigarettes    Quit date: 07/10/1984    Years since quitting: 35.6  . Smokeless tobacco: Never Used  Vaping Use  . Vaping Use: Never used  Substance and Sexual Activity  . Alcohol  use: No    Alcohol/week: 0.0 standard drinks    Comment: no alcoho since 1983  . Drug use: No  . Sexual activity: Not on file  Other Topics Concern  . Not on file  Social History Narrative   Home Situation: lives with husband. 2 daughters, 2 step daughters, 6 grandkids. 2 greatgrandkids    Work or School: very active in [employer] and church      Spiritual Beliefs: Darrick Meigs      Lifestyle:tries to walk; diet ok - denies any alcohol or tobacco use in many many years      Caffeine 1 cup coffee/day      Hobbies: news junkie, audio books   Social Determinants of Health   Financial Resource Strain: Low Risk   . Difficulty of Paying Living Expenses: Not hard at all  Food Insecurity: No Food Insecurity  . Worried About Charity fundraiser in the Last Year: Never true  . Ran Out of Food in the Last Year: Never true  Transportation Needs: No Transportation Needs  . Lack of Transportation (Medical): No  . Lack of Transportation (Non-Medical): No  Physical Activity:   . Days of Exercise per Week:   . Minutes of Exercise per Session:   Stress:   . Feeling of Stress :   Social Connections:   . Frequency of Communication with Friends and Family:   . Frequency of Social Gatherings with Friends and Family:   . Attends Religious Services:   . Active Member of Clubs or Organizations:   . Attends Archivist Meetings:   Marland Kitchen Marital Status:   Intimate Partner Violence:   . Fear of Current or Ex-Partner:   . Emotionally Abused:   Marland Kitchen Physically Abused:   . Sexually Abused:     PHYSICAL EXAM: Blood pressure (!) 148/75, pulse 81, height 5\' 5"  (1.651 m), weight 198 lb (89.8 kg), SpO2 (!) 84 %. General: No acute distress.  Patient appears well-groomed.   Head:  Normocephalic/atraumatic Eyes:  Fundi examined but not visualized Neck: supple, no paraspinal tenderness, full range of motion Heart:  Regular rate and rhythm Lungs:  Clear to auscultation bilaterally Back: No paraspinal  tenderness Neurological Exam: alert and oriented to person, place, and time. Attention span and concentration intact, recent and remote memory intact, fund of knowledge intact.  Speech fluent and  not dysarthric, language intact.  CN II-XII intact. Bulk and tone normal, muscle strength 5/5 throughout.  Sensation to light touch, temperature and vibration intact.  Deep tendon reflexes 2+ throughout, toes downgoing.  Finger to nose and heel to shin testing intact.  Gait normal, Romberg negative.  IMPRESSION: 1.  Possible CVA/TIA vs migraine.  2.  Recurrent transient global amnesia.  Workup unremarkable 3.  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  Ocular migraine 5.  HTN 6.  Hyperlipidemia  PLAN: 1.  ASA 325mg  and Plavix 37.5mg  daily  2.  Statin therapy (LDL goal less than 70) 3.  Optimize blood pressure control 4.  Follow up in 6 months.  Metta Clines, DO  CC:  Garret Reddish, MD

## 2020-02-20 ENCOUNTER — Telehealth: Payer: Self-pay | Admitting: Hematology

## 2020-02-20 ENCOUNTER — Encounter: Payer: Self-pay | Admitting: Neurology

## 2020-02-20 NOTE — Telephone Encounter (Signed)
No 8/11 los

## 2020-02-23 ENCOUNTER — Other Ambulatory Visit: Payer: Self-pay

## 2020-02-23 ENCOUNTER — Ambulatory Visit: Payer: Medicare HMO | Admitting: Neurology

## 2020-02-23 ENCOUNTER — Encounter: Payer: Self-pay | Admitting: Neurology

## 2020-02-23 VITALS — BP 148/75 | HR 81 | Ht 65.0 in | Wt 198.0 lb

## 2020-02-23 DIAGNOSIS — G454 Transient global amnesia: Secondary | ICD-10-CM | POA: Diagnosis not present

## 2020-02-23 DIAGNOSIS — I671 Cerebral aneurysm, nonruptured: Secondary | ICD-10-CM

## 2020-02-23 NOTE — Patient Instructions (Signed)
Sent letter for neurology clearance Follow up in 6 months

## 2020-02-25 ENCOUNTER — Telehealth: Payer: Self-pay

## 2020-02-25 NOTE — Telephone Encounter (Signed)
Nutrition Assessment  Reason for Assessment:  Pt attended Breast Clinic on 02/18/2020 and was given nutrition packet by nurse navigator with RD contact information  ASSESSMENT:  85 year old female with new diagnosis of breast cancer.  Planning lumpectomy, radiation and tamoxifen.  Past medical history reviewed.   Spoke with patient via phone to introduce self and service at [facility].  Patient reports good appetite and denies questions or concerns regarding nutrition at this time.  Praised Breast Clinic experience. "You all are really a team."    Medications:  reviewed  Labs: reviewed  Anthropometrics:   Height: 65 inches Weight: 198 lb  BMI: 32   NUTRITION DIAGNOSIS: Food and nutrition related knowledge deficit related to new diagnosis of breast cancer as evidenced by no prior need for nutrition related information.  INTERVENTION:   Discussed briefly packet of information regarding nutritional tips for breast cancer patients.  Questions answered. Contact information provided and patient knows to contact me with questions/concerns.    MONITORING, EVALUATION, and GOAL: Pt will consume a healthy plant based diet to maintain lean body mass throughout treatment.   Ethelwyn Gilbertson B. Zenia Resides, [place], [place] Registered Dietitian [phone] (mobile)

## 2020-02-26 ENCOUNTER — Encounter: Payer: Self-pay | Admitting: *Deleted

## 2020-02-26 ENCOUNTER — Telehealth: Payer: Self-pay | Admitting: *Deleted

## 2020-02-26 NOTE — Telephone Encounter (Signed)
Spoke with patient to follow up from [facility] last week.  Patient denies any concerns or questions at this time.  She did give compliments and praise to how everything went in clinic last week and stated how well everything was coordinated.  Encouraged her to call if anything arises.

## 2020-02-27 ENCOUNTER — Other Ambulatory Visit: Payer: Self-pay | Admitting: General Surgery

## 2020-02-27 DIAGNOSIS — Z170 Estrogen receptor positive status [ER+]: Secondary | ICD-10-CM

## 2020-02-27 DIAGNOSIS — C50412 Malignant neoplasm of upper-outer quadrant of left female breast: Secondary | ICD-10-CM

## 2020-03-01 DIAGNOSIS — R69 Illness, unspecified: Secondary | ICD-10-CM | POA: Diagnosis not present

## 2020-03-03 ENCOUNTER — Encounter: Payer: Self-pay | Admitting: *Deleted

## 2020-03-03 DIAGNOSIS — Z170 Estrogen receptor positive status [ER+]: Secondary | ICD-10-CM

## 2020-03-03 DIAGNOSIS — C50412 Malignant neoplasm of upper-outer quadrant of left female breast: Secondary | ICD-10-CM

## 2020-03-07 ENCOUNTER — Other Ambulatory Visit: Payer: Self-pay | Admitting: Neurology

## 2020-03-16 NOTE — Progress Notes (Signed)
Your procedure is scheduled on Tuesday, March 23, 2020.  Report to Sheila Shields Main Entrance "A" at 12:00 P.M., and check in at the Admitting office.  Call this number if you have problems the morning of surgery:  [phone]  Call [phone] if you have any questions prior to your surgery date Monday-Friday 8am-4pm    Remember:  Do not eat after midnight the night before your surgery  You may drink clear liquids until 11:00 AM the morning of your surgery.   Clear liquids allowed are: Water, Non-Citrus Juices (without pulp), Carbonated Beverages, Clear Tea, Black Coffee Only, and Gatorade  Please complete your PRE-SURGERY ENSURE that was provided to you by 11:00 AM the morning of surgery.  Please, if able, drink it in one setting. DO NOT SIP.    Take these medicines the morning of surgery with A SIP OF WATER:  acetaminophen ([employer] ARTHRITIS PAIN RELIEF) fexofenadine (ALLEGRA) pantoprazole (PROTONIX) rosuvastatin (CRESTOR)  If needed: albuterol (PROAIR HFA)  fluticasone (FLONASE)  Propylene Glycol (SYSTANE BALANCE OP)  Follow your surgeon's instructions on when to stop Aspirin and Plavix.  If no instructions were given by your surgeon then you will need to call the office to get those instructions.    Please bring all inhalers with you the day of surgery.   As of today, STOP taking any Aleve, Naproxen, Ibuprofen, Motrin, Advil, Goody's, BC's, all herbal medications, fish oil, and all vitamins.                      Do not wear jewelry, make up, or nail polish            Do not wear lotions, powders, perfumes, or deodorant.            Do not shave 48 hours prior to surgery.            Do not bring valuables to the hospital.            [facility] is not responsible for any belongings or valuables.  Do NOT Smoke (Tobacco/Vaping) or drink Alcohol 24 hours prior to your procedure If you use a CPAP at night, you may bring all equipment for your overnight stay.   Contacts,  glasses, dentures or bridgework may not be worn into surgery.      For patients admitted to the hospital, discharge time will be determined by your treatment team.   Patients discharged the day of surgery will not be allowed to drive home, and someone needs to stay with them for 24 hours.    Special instructions:   [place]- Preparing For Surgery  Before surgery, you can play an important role. Because skin is not sterile, your skin needs to be as free of germs as possible. You can reduce the number of germs on your skin by washing with CHG (chlorahexidine gluconate) Soap before surgery.  CHG is an antiseptic cleaner which kills germs and bonds with the skin to continue killing germs even after washing.    Oral Hygiene is also important to reduce your risk of infection.  Remember - BRUSH YOUR TEETH THE MORNING OF SURGERY WITH YOUR REGULAR TOOTHPASTE  Please do not use if you have an allergy to CHG or antibacterial soaps. If your skin becomes reddened/irritated stop using the CHG.  Do not shave (including legs and underarms) for at least 48 hours prior to first CHG shower. It is OK to shave your face.  Please follow  these instructions carefully.   1. Shower the NIGHT BEFORE SURGERY and the MORNING OF SURGERY with CHG Soap.   2. If you chose to wash your hair, wash your hair first as usual with your normal shampoo.  3. After you shampoo, rinse your hair and body thoroughly to remove the shampoo.  4. Use CHG as you would any other liquid soap. You can apply CHG directly to the skin and wash gently with a scrungie or a clean washcloth.   5. Apply the CHG Soap to your body ONLY FROM THE NECK DOWN.  Do not use on open wounds or open sores. Avoid contact with your eyes, ears, mouth and genitals (private parts). Wash Face and genitals (private parts)  with your normal soap.   6. Wash thoroughly, paying special attention to the area where your surgery will be performed.  7. Thoroughly rinse  your body with warm water from the neck down.  8. DO NOT shower/wash with your normal soap after using and rinsing off the CHG Soap.  9. Pat yourself dry with a CLEAN TOWEL.  10. Wear CLEAN PAJAMAS to bed the night before surgery  11. Place CLEAN SHEETS on your bed the night of your first shower and DO NOT SLEEP WITH PETS.   Day of Surgery: Wear Clean/Comfortable clothing the morning of surgery Do not apply any deodorants/lotions.   Remember to brush your teeth WITH YOUR REGULAR TOOTHPASTE.   Please read over the following fact sheets that you were given.

## 2020-03-17 ENCOUNTER — Encounter (HOSPITAL_COMMUNITY): Payer: Self-pay

## 2020-03-17 ENCOUNTER — Other Ambulatory Visit: Payer: Self-pay

## 2020-03-17 ENCOUNTER — Encounter (HOSPITAL_COMMUNITY)
Admission: RE | Admit: 2020-03-17 | Discharge: 2020-03-17 | Disposition: A | Discharge status: Home / Self Care | Payer: Medicare HMO | Source: Ambulatory Visit | Attending: General Surgery | Admitting: General Surgery

## 2020-03-17 DIAGNOSIS — Z01818 Encounter for other preprocedural examination: Secondary | ICD-10-CM | POA: Diagnosis not present

## 2020-03-17 HISTORY — DX: Malignant neoplasm of unspecified site of unspecified female breast: C50.919

## 2020-03-17 HISTORY — DX: Shortness of breath: R06.02

## 2020-03-17 LAB — BASIC METABOLIC PANEL
Anion gap: 10 (ref 5–15)
BUN: 17 mg/dL (ref 8–23)
CO2: 29 mmol/L (ref 22–32)
Calcium: 10.1 mg/dL (ref 8.9–10.3)
Chloride: 103 mmol/L (ref 98–111)
Creatinine, Ser: 0.86 mg/dL (ref 0.44–1.00)
GFR calc Af Amer: >60 mL/min (ref >60)
GFR calc non Af Amer: >60 mL/min (ref >60)
Glucose, Bld: 105 mg/dL — ABNORMAL HIGH (ref 70–99)
Potassium: 3.8 mmol/L (ref 3.5–5.1)
Sodium: 142 mmol/L (ref 135–145)

## 2020-03-17 LAB — CBC
HCT: 46.6 % — ABNORMAL HIGH (ref 36.0–46.0)
Hemoglobin: 15.2 g/dL — ABNORMAL HIGH (ref 12.0–15.0)
MCH: 31 pg (ref 26.0–34.0)
MCHC: 32.6 g/dL (ref 30.0–36.0)
MCV: 95.1 fL (ref 80.0–100.0)
Platelets: 275 10*3/uL (ref 150–400)
RBC: 4.9 MIL/uL (ref 3.87–5.11)
RDW: 13.3 % (ref 11.5–15.5)
WBC: 8.7 10*3/uL (ref 4.0–10.5)
nRBC: 0 % (ref 0.0–0.2)

## 2020-03-17 NOTE — Progress Notes (Addendum)
PCP - Dr. Garret Reddish Cardiologist - denies  PPM/ICD - denies  EKG - 03/17/2020 Chest x-ray - N/A  Stress Test - denies ECHO - denies Cardiac Cath - denies  Sleep Study - denies CPAP - N/A  DM: denies  Blood Thinner Instructions: PLAVIX: per patient last dose today 03/17/2020 Aspirin Instructions: per patient last dose today 03/17/2020  ERAS Protcol - Yes PRE-SURGERY Ensure or G2- Ensure given  COVID TEST- Scheduled for Friday 03/19/2020. Patient verbalized understanding of self-quarantine instructions, appointment time and place.  Anesthesia review: YES, seed placement appointment 03/22/2020, COPD  Patient denies shortness of breath, fever, cough and chest pain at PAT appointment  All instructions explained to the patient, with a verbal understanding of the material. Patient agrees to go over the instructions while at home for a better understanding. Patient also instructed to self quarantine after being tested for COVID-19. The opportunity to ask questions was provided.

## 2020-03-18 NOTE — Anesthesia Preprocedure Evaluation (Addendum)
Anesthesia Evaluation  Patient identified by MRN, date of birth, ID band Patient awake    Reviewed: Allergy & Precautions, NPO status , Patient's Chart, lab work & pertinent test results  Airway Mallampati: II  TM Distance: >3 FB Neck ROM: Limited    Dental  (+) Dental Advisory Given   Pulmonary COPD,  COPD inhaler, former smoker,    breath sounds clear to auscultation       Cardiovascular hypertension, Pt. on medications  Rhythm:Regular Rate:Normal     Neuro/Psych TIA   GI/Hepatic Neg liver ROS, GERD  ,  Endo/Other  negative endocrine ROS  Renal/GU negative Renal ROS     Musculoskeletal  (+) Arthritis ,   Abdominal   Peds  Hematology  (+) anemia ,   Anesthesia Other Findings   Reproductive/Obstetrics                            Anesthesia Physical Anesthesia Plan  ASA: III  Anesthesia Plan: General   Post-op Pain Management:    Induction: Intravenous  PONV Risk Score and Plan: 3 and Dexamethasone, Ondansetron and Treatment may vary due to age or medical condition  Airway Management Planned: LMA  Additional Equipment:   Intra-op Plan:   Post-operative Plan: Extubation in OR  Informed Consent: I have reviewed the patients History and Physical, chart, labs and discussed the procedure including the risks, benefits and alternatives for the proposed anesthesia with the patient or authorized representative who has indicated his/her understanding and acceptance.     Dental advisory given  Plan Discussed with: CRNA  Anesthesia Plan Comments: (PAT note by Karoline Caldwell, PA-C: Follows with neurology for history of possible CVA/TIA versus migraine, recurrent transient global amnesia with unremarkable work-up, history of supraclinoid right ICA aneurysm status post embolization June 2020 by Dr. Estanislado Pandy. Due to recurrence of transient global amnesia, she had an EEG on 04/07/2019 which was  normal awake and asleep. She underwent a repeat MRI of brain on 05/27/2019 which was stable with no acute intracranial abnormality. MRA of head showed interval treatment of supraclinoid right ICA aneurysm but otherwise unremarkable. Dr. Estanislado Pandy has stated that she needs to be on ASA 325mg  daily in addition to Plavix.   Last seen by neurologist Dr. Tomi Likens 02/23/20 and discussed upcoming surgery. Per note, "She was diagnosed with breast cancer and will need to undergo a lumpectomy.  I sent a letter to her surgeon clearing her from a neurologic perspective and agreeable to plan to discontinue Plavix 5 days prior to surgery and instead will take ASA daily until the day prior to surgery." Dr. Tomi Likens updated this recommendation in a letter dated 02/20/20 stating, "Dear Dr. Barry Dienes: I have no objection to your protocol to hold Plavix for 5 days prior to surgery date and go to aspirin 81mg  within that 5 day period."  Per PAT RN note, pt reported stopping both ASA and Plavix 03/17/20. I called [facility] and reviewed aspirin instructions in light of Dr. Georgie Chard recommendation. They will reach out to patient to ensure she is taking 81mg  ASA as directed.   Preop labs reviewed, unremarkable.  EKG 03/17/20: Sinus rhythm with 1st degree A-V block with occasional Premature ventricular complexes. Rate 66. Possible Left atrial enlargement. Septal infarct , age undetermined. Since last tracing Right bundle branch block NO LONGER PRESENT  )       Anesthesia Quick Evaluation

## 2020-03-18 NOTE — Progress Notes (Signed)
Anesthesia Chart Review:  Follows with neurology for history of possible CVA/TIA versus migraine, recurrent transient global amnesia with unremarkable work-up, history of supraclinoid right ICA aneurysm status post embolization June 2020 by Dr. Estanislado Pandy. Due to recurrence of transient global amnesia, she had an EEG on 04/07/2019 which was normal awake and asleep. She underwent a repeat MRI of brain on 05/27/2019 which was stable with no acute intracranial abnormality. MRA of head showed interval treatment of supraclinoid right ICA aneurysm but otherwise unremarkable. Dr. Estanislado Pandy has stated that she needs to be on ASA 325mg  daily in addition to Plavix.   Last seen by neurologist Dr. Tomi Likens 02/23/20 and discussed upcoming surgery. Per note, "She was diagnosed with breast cancer and will need to undergo a lumpectomy.  I sent a letter to her surgeon clearing her from a neurologic perspective and agreeable to plan to discontinue Plavix 5 days prior to surgery and instead will take ASA daily until the day prior to surgery." Dr. Tomi Likens updated this recommendation in a letter dated 02/20/20 stating, "Dear Dr. Barry Dienes: I have no objection to your protocol to hold Plavix for 5 days prior to surgery date and go to aspirin 81mg  within that 5 day period."  Per PAT RN note, pt reported stopping both ASA and Plavix 03/17/20. I called [facility] and reviewed aspirin instructions in light of Dr. Georgie Chard recommendation. They will reach out to patient to ensure she is taking 81mg  ASA as directed.   Preop labs reviewed, unremarkable.  EKG 03/17/20: Sinus rhythm with 1st degree A-V block with occasional Premature ventricular complexes. Rate 66. Possible Left atrial enlargement. Septal infarct , age undetermined. Since last tracing Right bundle branch block NO [place], PA-C [facility] Stay [facility]/Anesthesiology Phone [phone] 03/18/2020 12:59 PM

## 2020-03-19 ENCOUNTER — Other Ambulatory Visit (HOSPITAL_COMMUNITY)
Admission: RE | Admit: 2020-03-19 | Discharge: 2020-03-19 | Disposition: A | Discharge status: Home / Self Care | Payer: Medicare HMO | Source: Ambulatory Visit | Attending: General Surgery | Admitting: General Surgery

## 2020-03-19 DIAGNOSIS — Z20822 Contact with and (suspected) exposure to covid-19: Secondary | ICD-10-CM | POA: Insufficient documentation

## 2020-03-19 DIAGNOSIS — Z01812 Encounter for preprocedural laboratory examination: Secondary | ICD-10-CM | POA: Diagnosis not present

## 2020-03-19 LAB — SARS CORONAVIRUS 2 (TAT 6-24 HRS): SARS Coronavirus 2: NEGATIVE

## 2020-03-22 ENCOUNTER — Other Ambulatory Visit: Payer: Self-pay

## 2020-03-22 ENCOUNTER — Ambulatory Visit
Admission: RE | Admit: 2020-03-22 | Discharge: 2020-03-22 | Disposition: A | Discharge status: Home / Self Care | Payer: Medicare HMO | Source: Ambulatory Visit | Attending: General Surgery | Admitting: General Surgery

## 2020-03-22 DIAGNOSIS — R928 Other abnormal and inconclusive findings on diagnostic imaging of breast: Secondary | ICD-10-CM | POA: Diagnosis not present

## 2020-03-22 DIAGNOSIS — C50412 Malignant neoplasm of upper-outer quadrant of left female breast: Secondary | ICD-10-CM

## 2020-03-22 DIAGNOSIS — Z170 Estrogen receptor positive status [ER+]: Secondary | ICD-10-CM

## 2020-03-22 NOTE — H&P (Signed)
Sheila Shields Location: [facility] Patient #: [id number] DOB: 11-09-1934 Undefined / Language: Cleophus Molt / Race: White Female   History of Present Illness  The patient is a 85 year old female who presents with breast cancer. Pt is an 85 yo F who presents with screening detected breast cancer 02/2020. She was found to have screening detected architectural distortion on the left. She then had diagnostic imaging showing a 6 mm mass at 12 o'clock and a negative axilla. core needle biopsy was performed and it showed a grade 2 invasive mammary carcinoma (lobular phenotype), +/+/- receptors wtih LCIS. Ki 67 was 15%.   She has no personal history of cancer. Her mother had throat cancer, and her brother had some sort of cancer in his leg. She is a former smoker. 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 She doesn't use any drugs. She is a G2P2 with first child at age 4. She had menarche at age 45. She has not had periods since her hysterectomy. She is up to date wtih colonoscopy and has had a bone density study around 6 years ago.   dx mammogram/us 01/30/2020 CLINICAL DATA: 85 year old patient recalled from recent screening mammogram for evaluation of possible distortion in the upper central left breast.  EXAM: DIGITAL DIAGNOSTIC LEFT MAMMOGRAM WITH CAD AND TOMO  ULTRASOUND LEFT BREAST  COMPARISON: January 22, 2020 and earlier priors  ACR Breast Density Category b: There are scattered areas of fibroglandular density.  FINDINGS: There is persistent focal architectural distortion in the upper central left breast, middle third. This is seen on spot compression views and a 90 degree lateral view.  Mammographic images were processed with CAD.  Targeted ultrasound is performed, showing an irregular hypoechoic mass with posterior acoustic shadowing at 12 o'clock position 3 cm from nipple. Mass measures approximately 0.6 x 0.4 x 0.6 cm.  Ultrasound of the left  axilla is negative for lymphadenopathy.  IMPRESSION: 0.6 cm suspicious mass 12 o'clock axis left breast.  RECOMMENDATION: Ultrasound-guided core needle biopsy is recommended and has been scheduled for the patient on August 2nd 2021.  I have discussed the findings and recommendations with the patient. If applicable, a reminder letter will be sent to the patient regarding the next appointment.  BI-RADS CATEGORY 4: Suspicious.  pathology 02/09/2020 Breast, left, needle core biopsy, 12:30, 3 cmfn - INVASIVE MAMMARY CARCINOMA, SEE COMMENT. - MAMMARY CARCINOMA IN SITU. E-cadherin is negative in the invasive and in situ components consistent with a lobular phenotype. The tumor cells are NEGATIVE for Her2 (1+). Estrogen Receptor: 95%, POSITIVE, STRONG STAINING INTENSITY Progesterone Receptor: 90%, POSITIVE, MODERATE STAINING INTENSITY Proliferation Marker Ki67: 15%  CBC and CMET essentially normal other than Gluc 149    Past Surgical History Aneurysm Repair  Foot Surgery  Right. Hysterectomy (not due to cancer) - Partial  Open Inguinal Hernia Surgery  Right. Oral Surgery  Shoulder Surgery  Left. Tonsillectomy   Diagnostic Studies History Colonoscopy  5-10 years ago Mammogram  within last year Pap Smear  >5 years ago  Medication History Medications Reconciled  Social History Alcohol use  Remotely quit alcohol use. Caffeine use  Coffee. No drug use  Tobacco use  Former smoker.  Family History Alcohol Abuse  Mother. Cancer  Brother. Heart Disease  Father. Heart disease in female family member before age 70   Pregnancy / Birth History  Age at menarche  22 years. Age of menopause  <47 Contraceptive History  Oral contraceptives. Gravida  3 Irregular periods  Length (months) of breastfeeding  3-6 Maternal age  38-25 Para  2  Other Problems Arthritis  Back Pain  Cerebrovascular Accident  Chronic Obstructive Lung Disease   Gastroesophageal Reflux Disease  High blood pressure  Hypercholesterolemia  Inguinal Hernia  Other disease, cancer, significant illness     Review of Systems  General Present- Night Sweats. Not Present- Appetite Loss, Chills, Fatigue, Fever, Weight Gain and Weight Loss. Skin Not Present- Change in Wart/Mole, Dryness, Hives, Jaundice, New Lesions, Non-Healing Wounds, Rash and Ulcer. HEENT Present- Hoarseness and Wears glasses/contact lenses. Not Present- Earache, Hearing Loss, Nose Bleed, Oral Ulcers, Ringing in the Ears, Seasonal Allergies, Sinus Pain, Sore Throat, Visual Disturbances and Yellow Eyes. Respiratory Present- Chronic Cough, Difficulty Breathing and Wheezing. Not Present- Bloody sputum and Snoring. Breast Present- Breast Mass. Not Present- Breast Pain, Nipple Discharge and Skin Changes. Cardiovascular Present- Shortness of Breath and Swelling of Extremities. Not Present- Chest Pain, Difficulty Breathing Lying Down, Leg Cramps, Palpitations and Rapid Heart Rate. Gastrointestinal Not Present- Abdominal Pain, Bloating, Bloody Stool, Change in Bowel Habits, Chronic diarrhea, Constipation, Difficulty Swallowing, Excessive gas, Gets full quickly at meals, Hemorrhoids, Indigestion, Nausea, Rectal Pain and Vomiting. Female Genitourinary Not Present- Frequency, Nocturia, Painful Urination, Pelvic Pain and Urgency. Musculoskeletal Present- Back Pain and Joint Pain. Not Present- Joint Stiffness, Muscle Pain, Muscle Weakness and Swelling of Extremities. Neurological Not Present- Decreased Memory, Fainting, Headaches, Numbness, Seizures, Tingling, Tremor, Trouble walking and Weakness. Psychiatric Not Present- Anxiety, Bipolar, Change in Sleep Pattern, Depression, Fearful and Frequent crying. Endocrine Not Present- Cold Intolerance, Excessive Hunger, Hair Changes, Heat Intolerance, Hot flashes and New Diabetes. Hematology Present- Blood Thinners, Easy Bruising and Excessive bleeding. Not  Present- Gland problems, HIV and Persistent Infections.  Vitals Weight: 200.2 lb Height: 65in Body Surface Area: 1.98 m Body Mass Index: 33.31 kg/m  Temp.: 97.30F  Pulse: 84 (Regular)  Resp.: 18 (Unlabored)  P.OX: 100% (Room air) BP: 167/99(Sitting, Left Arm, Standard)       Physical Exam General Mental Status-Alert. General Appearance-Consistent with stated age. Hydration-Well hydrated. Voice-Normal.  Head and Neck Head-normocephalic, atraumatic with no lesions or palpable masses. Trachea-midline. Thyroid Gland Characteristics - normal size and consistency.  Eye Eyeball - Bilateral-Extraocular movements intact. Sclera/Conjunctiva - Bilateral-No scleral icterus.  Chest and Lung Exam Chest and lung exam reveals -quiet, even and easy respiratory effort with no use of accessory muscles and on auscultation, normal breath sounds, no adventitious sounds and normal vocal resonance. Inspection Chest Wall - Normal. Back - normal.  Breast Note: breasts relatively symmetric. bruising on the left upper breast is healing. no palpable masses or skin dimpling present in either breast. no nipple discharge or nipple retraction. Mild to moderate ptosis bilaterally as well. no LAD.   Cardiovascular Cardiovascular examination reveals -normal heart sounds, regular rate and rhythm with no murmurs and normal pedal pulses bilaterally.  Abdomen Inspection Inspection of the abdomen reveals - No Hernias. Palpation/Percussion Palpation and Percussion of the abdomen reveal - Soft, Non Tender, No Rebound tenderness, No Rigidity (guarding) and No hepatosplenomegaly. Auscultation Auscultation of the abdomen reveals - Bowel sounds normal.  Neurologic Neurologic evaluation reveals -alert and oriented x 3 with no impairment of recent or remote memory. Mental Status-Normal.  Musculoskeletal Global Assessment -Note: no gross deformities.  Normal Exam -  Left-Upper Extremity Strength Normal and Lower Extremity Strength Normal. Normal Exam - Right-Upper Extremity Strength Normal and Lower Extremity Strength Normal.  Lymphatic Head & Neck  General Head & Neck Lymphatics: Bilateral - Description - Normal.  Axillary  General Axillary Region: Bilateral - Description - Normal. Tenderness - Non Tender. Femoral & Inguinal  Generalized Femoral & Inguinal Lymphatics: Bilateral - Description - No Generalized lymphadenopathy.    Assessment & Plan  MALIGNANT NEOPLASM OF UPPER-OUTER QUADRANT OF LEFT BREAST IN FEMALE, ESTROGEN RECEPTOR POSITIVE (C50.412) Impression: Pt has a new dx of left breast cancer, cT1bN0. This is lobular, but her breasts are very fatty replaced, so will not order MRI.  She will be treated well with seed localized lumpectomy. This may or may not be followed by XRT or antiestrogen tx depending on final pathology.  The surgical procedure was described to the patient. I discussed the incision type and location and that we would need radiology involved on with a wire or seed marker and/or sentinel node.  The risks and benefits of the procedure were described to the patient and she wishes to proceed.  We discussed the risks bleeding, infection, damage to other structures, need for further procedures/surgeries. We discussed the risk of seroma. The patient was advised if the area in the breast in cancer, we may need to go back to surgery for additional tissue to obtain negative margins or for a lymph node biopsy. The patient was advised that these are the most common complications, but that others can occur as well. They were advised against taking aspirin or other anti-inflammatory agents/blood thinners the week before surgery. BRAIN ANEURYSM (I67.1) Impression: This is s/p coiling. She is on antiplatelet agents for this. ASA is by Dr. Estanislado Pandy and 1/2 dose plavix is from Dr. Tomi Likens.  We will need clearance to hold her antiplatelet  agents pre op. I would be OK holding plavix for 5 days and go to ASA 81 mg in the 5 day period if [facility] with neurology and neurointerventional radiology. RECOVERING ALCOHOLIC IN REMISSION (Q23.00) Impression: Sober since 63. Wants to avoid opiates if possible. Does well with tramadol.

## 2020-03-23 ENCOUNTER — Ambulatory Visit
Admission: RE | Admit: 2020-03-23 | Discharge: 2020-03-23 | Disposition: A | Discharge status: Home / Self Care | Payer: Medicare HMO | Source: Ambulatory Visit | Attending: General Surgery | Admitting: General Surgery

## 2020-03-23 ENCOUNTER — Encounter (HOSPITAL_COMMUNITY)
Admission: RE | Disposition: A | Discharge status: Home / Self Care | Payer: Self-pay | Source: Home / Self Care | Attending: General Surgery

## 2020-03-23 ENCOUNTER — Ambulatory Visit (HOSPITAL_COMMUNITY): Payer: Medicare HMO | Admitting: Physician Assistant

## 2020-03-23 ENCOUNTER — Ambulatory Visit (HOSPITAL_COMMUNITY): Payer: Medicare HMO | Admitting: Anesthesiology

## 2020-03-23 ENCOUNTER — Ambulatory Visit (HOSPITAL_COMMUNITY)
Admission: RE | Admit: 2020-03-23 | Discharge: 2020-03-23 | Disposition: A | Discharge status: Home / Self Care | Payer: Medicare HMO | Attending: General Surgery | Admitting: General Surgery

## 2020-03-23 ENCOUNTER — Encounter (HOSPITAL_COMMUNITY): Payer: Self-pay | Admitting: General Surgery

## 2020-03-23 DIAGNOSIS — Z8673 Personal history of transient ischemic attack (TIA), and cerebral infarction without residual deficits: Secondary | ICD-10-CM | POA: Insufficient documentation

## 2020-03-23 DIAGNOSIS — Z170 Estrogen receptor positive status [ER+]: Secondary | ICD-10-CM | POA: Insufficient documentation

## 2020-03-23 DIAGNOSIS — I10 Essential (primary) hypertension: Secondary | ICD-10-CM | POA: Diagnosis not present

## 2020-03-23 DIAGNOSIS — R928 Other abnormal and inconclusive findings on diagnostic imaging of breast: Secondary | ICD-10-CM | POA: Diagnosis not present

## 2020-03-23 DIAGNOSIS — Z7902 Long term (current) use of antithrombotics/antiplatelets: Secondary | ICD-10-CM | POA: Diagnosis not present

## 2020-03-23 DIAGNOSIS — Z7982 Long term (current) use of aspirin: Secondary | ICD-10-CM | POA: Insufficient documentation

## 2020-03-23 DIAGNOSIS — C50412 Malignant neoplasm of upper-outer quadrant of left female breast: Secondary | ICD-10-CM | POA: Diagnosis not present

## 2020-03-23 DIAGNOSIS — J449 Chronic obstructive pulmonary disease, unspecified: Secondary | ICD-10-CM | POA: Insufficient documentation

## 2020-03-23 DIAGNOSIS — K219 Gastro-esophageal reflux disease without esophagitis: Secondary | ICD-10-CM | POA: Diagnosis not present

## 2020-03-23 DIAGNOSIS — Z87891 Personal history of nicotine dependence: Secondary | ICD-10-CM | POA: Diagnosis not present

## 2020-03-23 DIAGNOSIS — C50912 Malignant neoplasm of unspecified site of left female breast: Secondary | ICD-10-CM | POA: Diagnosis not present

## 2020-03-23 HISTORY — PX: BREAST LUMPECTOMY WITH RADIOACTIVE SEED LOCALIZATION: SHX6424

## 2020-03-23 SURGERY — BREAST LUMPECTOMY WITH RADIOACTIVE SEED LOCALIZATION
Anesthesia: General | Site: Breast | Laterality: Left

## 2020-03-23 MED ORDER — BUPIVACAINE-EPINEPHRINE 0.5% -1:200000 IJ SOLN
INTRAMUSCULAR | Status: AC
  Filled 2020-03-23: qty 1

## 2020-03-23 MED ORDER — BUPIVACAINE HCL (PF) 0.25 % IJ SOLN
INTRAMUSCULAR | Status: AC
  Filled 2020-03-23: qty 30

## 2020-03-23 MED ORDER — BUPIVACAINE-EPINEPHRINE (PF) 0.25% -1:200000 IJ SOLN
INTRAMUSCULAR | Status: DC | PRN
  Administered 2020-03-23: 20 mL via PERINEURAL

## 2020-03-23 MED ORDER — STERILE WATER FOR IRRIGATION IR SOLN
Status: DC | PRN
  Administered 2020-03-23: 1000 mL

## 2020-03-23 MED ORDER — ONDANSETRON HCL 4 MG/2ML IJ SOLN
INTRAMUSCULAR | Status: DC | PRN
  Administered 2020-03-23: 4 mg via INTRAVENOUS

## 2020-03-23 MED ORDER — ENSURE PRE-SURGERY PO LIQD: 296.0000 mL | Freq: Once | ORAL | Status: DC

## 2020-03-23 MED ORDER — LIDOCAINE-EPINEPHRINE 1 %-1:100000 IJ SOLN
INTRAMUSCULAR | Status: AC
  Filled 2020-03-23: qty 1

## 2020-03-23 MED ORDER — PROPOFOL 10 MG/ML IV BOLUS
INTRAVENOUS | Status: DC | PRN
  Administered 2020-03-23: 200 mg via INTRAVENOUS

## 2020-03-23 MED ORDER — 0.9 % SODIUM CHLORIDE (POUR BTL) OPTIME
TOPICAL | Status: DC | PRN
  Administered 2020-03-23: 1000 mL

## 2020-03-23 MED ORDER — TRAMADOL HCL 50 MG PO TABS: 50.0000 mg | ORAL_TABLET | Freq: Four times a day (QID) | ORAL | 0 refills | Status: DC | PRN

## 2020-03-23 MED ORDER — LIDOCAINE 2% (20 MG/ML) 5 ML SYRINGE
INTRAMUSCULAR | Status: DC | PRN
  Administered 2020-03-23: 60 mg via INTRAVENOUS

## 2020-03-23 MED ORDER — LIDOCAINE HCL 1 % IJ SOLN
INTRAMUSCULAR | Status: AC
  Filled 2020-03-23: qty 20

## 2020-03-23 MED ORDER — FENTANYL CITRATE (PF) 100 MCG/2ML IJ SOLN: 25.0000 ug | INTRAMUSCULAR | Status: DC | PRN

## 2020-03-23 MED ORDER — CHLORHEXIDINE GLUCONATE CLOTH 2 % EX PADS: 6.0000 | MEDICATED_PAD | Freq: Once | CUTANEOUS | Status: DC

## 2020-03-23 MED ORDER — ORAL CARE MOUTH RINSE: 15.0000 mL | Freq: Once | OROMUCOSAL | Status: AC

## 2020-03-23 MED ORDER — AMISULPRIDE (ANTIEMETIC) 5 MG/2ML IV SOLN: 10.0000 mg | Freq: Once | INTRAVENOUS | Status: DC | PRN

## 2020-03-23 MED ORDER — LACTATED RINGERS IV SOLN: INTRAVENOUS | Status: DC

## 2020-03-23 MED ORDER — CEFAZOLIN SODIUM-DEXTROSE 2-4 GM/100ML-% IV SOLN
2.0000 g | INTRAVENOUS | Status: AC
  Administered 2020-03-23: 2 g via INTRAVENOUS
  Filled 2020-03-23: qty 100

## 2020-03-23 MED ORDER — FENTANYL CITRATE (PF) 250 MCG/5ML IJ SOLN
INTRAMUSCULAR | Status: AC
  Filled 2020-03-23: qty 5

## 2020-03-23 MED ORDER — FENTANYL CITRATE (PF) 100 MCG/2ML IJ SOLN
INTRAMUSCULAR | Status: DC | PRN
Start: 2020-03-23 — End: 2020-03-23
  Administered 2020-03-23 (×2): 25 ug via INTRAVENOUS
  Administered 2020-03-23: 50 ug via INTRAVENOUS

## 2020-03-23 MED ORDER — PROPOFOL 10 MG/ML IV BOLUS
INTRAVENOUS | Status: AC
  Filled 2020-03-23: qty 20

## 2020-03-23 MED ORDER — DEXAMETHASONE SODIUM PHOSPHATE 10 MG/ML IJ SOLN
INTRAMUSCULAR | Status: DC | PRN
  Administered 2020-03-23: 10 mg via INTRAVENOUS

## 2020-03-23 MED ORDER — EPHEDRINE SULFATE 50 MG/ML IJ SOLN
INTRAMUSCULAR | Status: DC | PRN
  Administered 2020-03-23: 10 mg via INTRAVENOUS

## 2020-03-23 MED ORDER — LIDOCAINE HCL 1 % IJ SOLN
INTRAMUSCULAR | Status: DC | PRN
  Administered 2020-03-23: 20 mL

## 2020-03-23 MED ORDER — CHLORHEXIDINE GLUCONATE 0.12 % MT SOLN
15.0000 mL | Freq: Once | OROMUCOSAL | Status: AC
  Administered 2020-03-23: 15 mL via OROMUCOSAL
  Filled 2020-03-23: qty 15

## 2020-03-23 SURGICAL SUPPLY — 45 items
BINDER BREAST LRG (GAUZE/BANDAGES/DRESSINGS) IMPLANT
BINDER BREAST XLRG (GAUZE/BANDAGES/DRESSINGS) IMPLANT
BINDER BREAST XXLRG (GAUZE/BANDAGES/DRESSINGS) ×2 IMPLANT
BLADE SURG 10 STRL SS (BLADE) ×2 IMPLANT
CANISTER SUCT 3000ML PPV (MISCELLANEOUS) IMPLANT
CHLORAPREP W/TINT 26 (MISCELLANEOUS) ×2 IMPLANT
CLIP VESOCCLUDE LG 6/CT (CLIP) ×2 IMPLANT
CLIP VESOCCLUDE MED 6/CT (CLIP) ×8 IMPLANT
COVER PROBE W GEL 5X96 (DRAPES) ×2 IMPLANT
COVER SURGICAL LIGHT HANDLE (MISCELLANEOUS) ×2 IMPLANT
COVER WAND RF STERILE (DRAPES) IMPLANT
DERMABOND ADVANCED (GAUZE/BANDAGES/DRESSINGS) ×1
DERMABOND ADVANCED .7 DNX12 (GAUZE/BANDAGES/DRESSINGS) ×1 IMPLANT
DEVICE DUBIN SPECIMEN MAMMOGRA (MISCELLANEOUS) ×2 IMPLANT
DRAPE CHEST BREAST 15X10 FENES (DRAPES) ×2 IMPLANT
DRSG PAD ABDOMINAL 8X10 ST (GAUZE/BANDAGES/DRESSINGS) ×2 IMPLANT
ELECT BLADE 4.0 EZ CLEAN MEGAD (MISCELLANEOUS) ×2
ELECT COATED BLADE 2.86 ST (ELECTRODE) ×2 IMPLANT
ELECT REM PT RETURN 9FT ADLT (ELECTROSURGICAL) ×2
ELECTRODE BLDE 4.0 EZ CLN MEGD (MISCELLANEOUS) ×1 IMPLANT
ELECTRODE REM PT RTRN 9FT ADLT (ELECTROSURGICAL) ×1 IMPLANT
GAUZE SPONGE 4X4 12PLY STRL LF (GAUZE/BANDAGES/DRESSINGS) ×2 IMPLANT
GLOVE BIO SURGEON STRL SZ 6 (GLOVE) ×2 IMPLANT
GLOVE INDICATOR 6.5 STRL GRN (GLOVE) ×2 IMPLANT
GOWN STRL REUS W/ TWL LRG LVL3 (GOWN DISPOSABLE) ×1 IMPLANT
GOWN STRL REUS W/TWL 2XL LVL3 (GOWN DISPOSABLE) ×2 IMPLANT
GOWN STRL REUS W/TWL LRG LVL3 (GOWN DISPOSABLE) ×2
KIT BASIN OR (CUSTOM PROCEDURE TRAY) ×2 IMPLANT
KIT MARKER MARGIN INK (KITS) ×2 IMPLANT
LIGHT WAVEGUIDE WIDE FLAT (MISCELLANEOUS) ×2 IMPLANT
NEEDLE HYPO 25GX1X1/2 BEV (NEEDLE) ×2 IMPLANT
NS IRRIG 1000ML POUR BTL (IV SOLUTION) ×2 IMPLANT
PACK GENERAL/GYN (CUSTOM PROCEDURE TRAY) ×2 IMPLANT
PENCIL SMOKE EVACUATOR (MISCELLANEOUS) ×2 IMPLANT
STRIP CLOSURE SKIN 1/2X4 (GAUZE/BANDAGES/DRESSINGS) ×2 IMPLANT
SUT MNCRL AB 4-0 PS2 18 (SUTURE) ×2 IMPLANT
SUT SILK 2 0 SH (SUTURE) IMPLANT
SUT VIC AB 2-0 SH 27 (SUTURE) ×2
SUT VIC AB 2-0 SH 27XBRD (SUTURE) ×1 IMPLANT
SUT VIC AB 3-0 SH 27 (SUTURE) ×2
SUT VIC AB 3-0 SH 27X BRD (SUTURE) ×1 IMPLANT
SYR CONTROL 10ML LL (SYRINGE) ×2 IMPLANT
TOWEL GREEN STERILE (TOWEL DISPOSABLE) ×2 IMPLANT
TOWEL GREEN STERILE FF (TOWEL DISPOSABLE) ×2 IMPLANT
WATER STERILE IRR 1000ML POUR (IV SOLUTION) ×2 IMPLANT

## 2020-03-23 NOTE — Interval H&P Note (Signed)
History and Physical Interval Note:  03/23/2020 9:48 AM  Sheila Shields  has presented today for surgery, with the diagnosis of LEFT BREAST CANCER.  The various methods of treatment have been discussed with the patient and family. After consideration of risks, benefits and other options for treatment, the patient has consented to  Procedure(s) with comments: LEFT BREAST LUMPECTOMY WITH RADIOACTIVE SEED LOCALIZATION (Left) - RNFA as a surgical intervention.  The patient's history has been reviewed, patient examined, no change in status, stable for surgery.  I have reviewed the patient's chart and labs.  Questions were answered to the patient's satisfaction.     [name]

## 2020-03-23 NOTE — Discharge Instructions (Addendum)
[facility] °Office Phone Number [phone] ° °BREAST BIOPSY/ PARTIAL MASTECTOMY: POST OP INSTRUCTIONS ° °Always review your discharge instruction sheet given to you by the facility where your surgery was performed. ° °IF YOU HAVE DISABILITY OR FAMILY LEAVE FORMS, YOU MUST BRING THEM TO THE OFFICE FOR PROCESSING.  DO NOT GIVE THEM TO YOUR DOCTOR. ° °1. A prescription for pain medication may be given to you upon discharge.  Take your pain medication as prescribed, if needed.  If narcotic pain medicine is not needed, then you may take acetaminophen (Tylenol) or ibuprofen (Advil) as needed. °2. Take your usually prescribed medications unless otherwise directed °3. If you need a refill on your pain medication, please contact your pharmacy.  They will contact our office to request authorization.  Prescriptions will not be filled after 5pm or on week-ends. °4. You should eat very light the first 24 hours after surgery, such as soup, crackers, pudding, etc.  Resume your normal diet the day after surgery. °5. Most patients will experience some swelling and bruising in the breast.  Ice packs and a good support bra will help.  Swelling and bruising can take several days to resolve.  °6. It is common to experience some constipation if taking pain medication after surgery.  Increasing fluid intake and taking a stool softener will usually help or prevent this problem from occurring.  A mild laxative (Milk of Magnesia or Miralax) should be taken according to package directions if there are no bowel movements after 48 hours. °7. Unless discharge instructions indicate otherwise, you may remove your bandages 48 hours after surgery, and you may shower at that time.  You may have steri-strips (small skin tapes) in place directly over the incision.  These strips should be left on the skin for 7-10 days.   Any sutures or staples will be removed at the office during your follow-up visit. °8. ACTIVITIES:  You may resume  regular daily activities (gradually increasing) beginning the next day.  Wearing a good support bra or sports bra (or the breast binder) minimizes pain and swelling.  You may have sexual intercourse when it is comfortable. °a. You may drive when you no longer are taking prescription pain medication, you can comfortably wear a seatbelt, and you can safely maneuver your car and apply brakes. °b. RETURN TO WORK:  __________1 week_______________ °9. You should see your doctor in the office for a follow-up appointment approximately two weeks after your surgery.  Your doctor’s nurse will typically make your follow-up appointment when she calls you with your pathology report.  Expect your pathology report 2-3 business days after your surgery.  You may call to check if you do not hear from us after three days. ° ° °WHEN TO CALL YOUR DOCTOR: °1. Fever over 101.0 °2. Nausea and/or vomiting. °3. Extreme swelling or bruising. °4. Continued bleeding from incision. °5. Increased pain, redness, or drainage from the incision. ° °The clinic staff is available to answer your questions during regular business hours.  Please don’t hesitate to call and ask to speak to one of the nurses for clinical concerns.  If you have a medical emergency, go to the nearest emergency room or call 911.  A surgeon from [facility] is always on call at the hospital. ° °For further questions, please visit [email or web address]  ° °

## 2020-03-23 NOTE — Anesthesia Procedure Notes (Signed)
Procedure Name: LMA Insertion Date/Time: 03/23/2020 10:18 AM Performed by: Babs Bertin, CRNA Pre-anesthesia Checklist: Patient identified, Emergency Drugs available, Suction available and Patient being monitored Patient Re-evaluated:Patient Re-evaluated prior to induction Oxygen Delivery Method: Circle System Utilized Preoxygenation: Pre-oxygenation with 100% oxygen Induction Type: IV induction Ventilation: Mask ventilation without difficulty LMA: LMA inserted LMA Size: 4.0 Number of attempts: 1 Airway Equipment and Method: Bite block Placement Confirmation: positive ETCO2 Tube secured with: Tape Dental Injury: Teeth and Oropharynx as per pre-operative assessment

## 2020-03-23 NOTE — Transfer of Care (Signed)
Immediate Anesthesia Transfer of Care Note  Patient: Sheila Shields  Procedure(s) Performed: LEFT BREAST LUMPECTOMY WITH RADIOACTIVE SEED LOCALIZATION (Left Breast)  Patient Location: PACU  Anesthesia Type:General  Level of Consciousness: awake, alert  and oriented  Airway & Oxygen Therapy: Patient Spontanous Breathing and Patient connected to face mask oxygen  Post-op Assessment: Report given to RN and Post -op Vital signs reviewed and stable  Post vital signs: Reviewed and stable  Last Vitals:  Vitals Value Taken Time  BP 120/66 03/23/20 1138  Temp 36.4 C 03/23/20 1138  Pulse 73 03/23/20 1143  Resp 10 03/23/20 1143  SpO2 95 % 03/23/20 1143  Vitals shown include unvalidated device data.  Last Pain:  Vitals:   03/23/20 1138  TempSrc:   PainSc: 0-No pain         Complications: No complications documented.

## 2020-03-23 NOTE — Op Note (Signed)
Left Breast Radioactive seed localized lumpectomy  Indications: This patient presents with history of left breast cancer, upper outer quadrant, cT1bN0M0 grade 2 invasive lobular carcinoma, receptors+/+/-  Pre-operative Diagnosis: left breast cancer  Post-operative Diagnosis: Same  Surgeon: OZWRKY,BTVDF   Assistant:  Pryor Curia, RNFA  Anesthesia: General endotracheal anesthesia  ASA Class: 3  Procedure Details  The patient was seen in the Holding Room. The risks, benefits, complications, treatment options, and expected outcomes were discussed with the patient. The possibilities of bleeding, infection, the need for additional procedures, failure to diagnose a condition, and creating a complication requiring other procedures or operations were discussed with the patient. The patient concurred with the proposed plan, giving informed consent.  The site of surgery properly noted/marked. The patient was taken to Operating Room # 11, identified, and the procedure verified as left breast seed localized lumpectomy.  The left breast and chest were prepped and draped in standard fashion. A superior circumareolar incision was made near the previously placed radioactive seed.  Dissection was carried down around the point of maximum signal intensity. The cautery was used to perform the dissection.   The specimen was inked with the margin marker paint kit.    Specimen radiography confirmed inclusion of the mammographic lesion, the clip, and the seed.  The background signal in the breast was zero.  Hemostasis was achieved with cautery.  The seed and clip appeared close to the top of the specimen so additional medial, superior, and posterior margins were taken.  The cavity was marked with clips on each border other than the anterior border.  The wound was irrigated and closed with 3-0 vicryl interrupted deep dermal sutures and 4-0 monocryl running subcuticular suture.      Sterile dressings were applied. At  the end of the operation, all sponge, instrument, and needle counts were correct.   Findings: Seed, clip in specimen.  Posterior margin is pectoralis and anterior margin is skin   Estimated Blood Loss:  min         Specimens: left breast tissue with seed, additional superior margin, additional medial margin, additional posterior margin.         Complications:  None; patient tolerated the procedure well.         Disposition: PACU - hemodynamically stable.         Condition: stable

## 2020-03-23 NOTE — Anesthesia Postprocedure Evaluation (Signed)
Anesthesia Post Note  Patient: Sheila Shields  Procedure(s) Performed: LEFT BREAST LUMPECTOMY WITH RADIOACTIVE SEED LOCALIZATION (Left Breast)     Patient location during evaluation: PACU Anesthesia Type: General Level of consciousness: awake and alert Pain management: pain level controlled Vital Signs Assessment: post-procedure vital signs reviewed and stable Respiratory status: spontaneous breathing, nonlabored ventilation, respiratory function stable and patient connected to nasal cannula oxygen Cardiovascular status: blood pressure returned to baseline and stable Postop Assessment: no apparent nausea or vomiting Anesthetic complications: no   No complications documented.  Last Vitals:  Vitals:   03/23/20 1207 03/23/20 1222  BP: 128/71 128/76  Pulse: 66 65  Resp: 11 16  Temp:  (!) 36.4 C  SpO2: 100% 97%    Last Pain:  Vitals:   03/23/20 1222  TempSrc:   PainSc: 0-No pain                 Tiajuana Amass

## 2020-03-24 ENCOUNTER — Encounter (HOSPITAL_COMMUNITY): Payer: Self-pay | Admitting: General Surgery

## 2020-03-25 LAB — SURGICAL PATHOLOGY

## 2020-03-29 ENCOUNTER — Encounter: Payer: Self-pay | Admitting: *Deleted

## 2020-04-10 ENCOUNTER — Encounter: Payer: Self-pay | Admitting: Family Medicine

## 2020-04-10 DIAGNOSIS — R69 Illness, unspecified: Secondary | ICD-10-CM | POA: Diagnosis not present

## 2020-04-20 ENCOUNTER — Other Ambulatory Visit: Payer: Self-pay | Admitting: Family Medicine

## 2020-04-20 ENCOUNTER — Ambulatory Visit
Admission: RE | Admit: 2020-04-20 | Discharge: 2020-04-20 | Disposition: A | Discharge status: Home / Self Care | Payer: Medicare HMO | Source: Ambulatory Visit | Attending: Radiation Oncology | Admitting: Radiation Oncology

## 2020-04-20 ENCOUNTER — Encounter: Payer: Self-pay | Admitting: Radiation Oncology

## 2020-04-20 ENCOUNTER — Other Ambulatory Visit: Payer: Self-pay | Admitting: Neurology

## 2020-04-20 ENCOUNTER — Other Ambulatory Visit: Payer: Self-pay

## 2020-04-20 DIAGNOSIS — C50412 Malignant neoplasm of upper-outer quadrant of left female breast: Secondary | ICD-10-CM | POA: Diagnosis not present

## 2020-04-20 DIAGNOSIS — Z170 Estrogen receptor positive status [ER+]: Secondary | ICD-10-CM | POA: Diagnosis not present

## 2020-04-20 DIAGNOSIS — Z9889 Other specified postprocedural states: Secondary | ICD-10-CM | POA: Diagnosis not present

## 2020-04-20 NOTE — Progress Notes (Signed)
Radiation Oncology         [phone] ________________________________  Initial Outpatient Consultation - Conducted via telephone due to current COVID-19 concerns for limiting patient exposure  I spoke with the patient to conduct this consult visit via telephone to spare the patient unnecessary potential exposure in the healthcare setting during the current COVID-19 pandemic. The patient was notified in advance and was offered a [place] meeting to allow for face to face communication but unfortunately reported that they did not have the appropriate resources/technology to support such a visit and instead preferred to proceed with a telephone consult.    Name: Sheila Shields        MRN: [id number]  Date of Service: 04/20/2020 DOB: 1935-05-16  QQ:[id number], Brayton Mars, MD  Truitt Merle, MD     REFERRING PHYSICIAN: Truitt Merle, MD   DIAGNOSIS: The encounter diagnosis was Malignant neoplasm of upper-outer quadrant of left breast in female, estrogen receptor positive ([place]).   HISTORY OF PRESENT ILLNESS: Sheila Shields is a 85 y.o. female originally seen in the multidisciplinary breast clinic for a new diagnosis of left breast cancer. The patient was noted to have screening detected distortion in the upper outer quadrant of the left breast.  Upon further diagnostic study at around 12:00 there was a 6 x 6 x 4 mm area of abnormality within the left breast, her axilla on that side was negative.  She did undergo a biopsy on 02/09/2020 which revealed an invasive lobular carcinoma, grade 2 with associated LCIS.  Her tumor was ER/PR positive, HER-2 negative with a Ki-67 of 15%.  She subsequently underwent left lumpectomy on 03/23/2020. This revealed a 1.1 cm invasive lobular carcinoma that was grade 2, invasive carcinoma focally involves the medial margin, but additional medial margin that was excised as well as superior and posterior margins were all clear. She is contacted today to discuss next steps that she had  previously been interested in adjuvant therapies.   PREVIOUS RADIATION THERAPY: No   PAST MEDICAL HISTORY:  Past Medical History:  Diagnosis Date   Alcoholism in remission ([place]) 08/27/2007   Anemia    Arthritis    OA   Breast cancer ([place])    left breast   COPD 12/17/2006   Diverticulosis 03/27/2007   GERD 05/01/2007   HYPERGLYCEMIA 08/27/2007   HYPERLIPIDEMIA 12/17/2006   HYPERTENSION 12/17/2006   Mood disorder ([place])    hx anxiety and depression. meds short term during life transition   Pneumonia    AS A CHILD   Rectal polyp 03/27/2007   adenoma   SOB (shortness of breath) on exertion    per patient due to having COPD   TIA (transient ischemic attack)        PAST SURGICAL HISTORY: Past Surgical History:  Procedure Laterality Date   ABDOMINAL HYSTERECTOMY  1978   fibroma   APPENDECTOMY  2002   BREAST EXCISIONAL BIOPSY Right 2003   negative   BREAST LUMPECTOMY WITH RADIOACTIVE SEED LOCALIZATION Left 03/23/2020   Procedure: LEFT BREAST LUMPECTOMY WITH RADIOACTIVE SEED LOCALIZATION;  Surgeon: [name], MD;  Location: [facility];  Service: General;  Laterality: Left;  RNFA   CARPAL TUNNEL RELEASE Left 2010 or 2011   CATARACT EXTRACTION W/ INTRAOCULAR LENS  IMPLANT, BILATERAL Bilateral    COLONOSCOPY W/ POLYPECTOMY  03/27/2007; n7/19/2012   2008: 4 mm adenoma, diverticulosis 2012: ileitis ? NSAID - likely, 2-3 mm cecal polyp LYMPHOID FOLLICLE, diverticulosis   ESOPHAGOGASTRODUODENOSCOPY  01/26/2011   reflux  esophagitis, colonscopy done also   HAMMER TOE SURGERY  2008   left foot   HERNIA REPAIR Right 1996?   IR ANGIO INTRA EXTRACRAN SEL COM CAROTID INNOMINATE UNI R MOD SED  11/28/2018   IR ANGIO INTRA EXTRACRAN SEL INTERNAL CAROTID UNI R MOD SED  12/30/2018   IR ANGIO VERTEBRAL SEL SUBCLAVIAN INNOMINATE UNI R MOD SED  11/28/2018   IR ANGIOGRAM FOLLOW UP STUDY  12/30/2018   IR TRANSCATH/EMBOLIZ  12/30/2018   IR US GUIDE VASC ACCESS RIGHT  11/28/2018    ORIF ANKLE FRACTURE Right 12/22/2014   Procedure: OPEN REDUCTION INTERNAL FIXATION (ORIF) ANKLE FRACTURE;  Surgeon: Susa Day, MD;  Location: [facility];  Service: Orthopedics;  Laterality: Right;   RADIOLOGY WITH ANESTHESIA N/A 12/30/2018   Procedure: RADIOLOGY WITH ANESTHESIA;  Surgeon: Luanne Bras, MD;  Location: [facility];  Service: Radiology;  Laterality: N/A;   SHOULDER OPEN ROTATOR CUFF REPAIR Left 03/06/2013   Procedure: LEFT ROTATOR CUFF REPAIR, SUBACROMIAL DECOMPRESSION, PATCH GRAFT, MANIPULATION UNDER ANESTHESIA;  Surgeon: Johnn Hai, MD;  Location: [facility];  Service: Orthopedics;  Laterality: Left;   TONSILLECTOMY       FAMILY HISTORY:  Family History  Problem Relation Age of Onset   Throat cancer Mother        38   Heart attack Father 22   Idiopathic pulmonary fibrosis Brother 52   Cancer Brother        sarcoma   Heart disease Brother    Colon cancer Neg Hx    Esophageal cancer Neg Hx    Stomach cancer Neg Hx      SOCIAL HISTORY:  reports that she quit smoking about 35 years ago. Her smoking use included cigarettes. She has a 60.00 pack-year smoking history. She has never used smokeless tobacco. She reports that she does not drink alcohol and does not use drugs. The patient is married and lives in [place]. She has 4 adult daughters. She enjoys Firefighter.    ALLERGIES: Alcohol, Codeine, and Dilaudid [hydromorphone hcl]   MEDICATIONS:  Current Outpatient Medications  Medication Sig Dispense Refill   acetaminophen ([employer] ARTHRITIS PAIN RELIEF) 650 MG CR tablet Take 1,300 mg by mouth 2 (two) times daily.     albuterol (PROAIR HFA) 108 (90 Base) MCG/ACT inhaler INHALE 2 PUFFS EVERY 4 HOURS AS NEEDED FOR COUGHING SPELLS (Patient taking differently: Inhale 2 puffs into the lungs every 4 (four) hours as needed (coughing spells). ) 8.5 Inhaler 2   aspirin 325 MG tablet Take 1 tablet (325 mg total) by mouth daily. 30 tablet 3   clopidogrel (PLAVIX) 75 MG  tablet Take 35.5 mg by mouth daily.     Coenzyme Q10 (COQ10) 100 MG CAPS Take 300 capsules by mouth daily.      ferrous sulfate 325 (65 FE) MG tablet TAKE 1 TABLET BY MOUTH EVERY DAY (Patient taking differently: Take 325 mg by mouth daily with breakfast. ) 90 tablet 3   fexofenadine (ALLEGRA) 180 MG tablet Take 180 mg by mouth daily.     fluticasone (FLONASE) 50 MCG/ACT nasal spray Place 2 sprays into both nostrils daily. (Patient taking differently: Place 2 sprays into both nostrils daily as needed. ) 16 g 2   hydrochlorothiazide (HYDRODIURIL) 25 MG tablet Take 1 tablet (25 mg total) by mouth daily. 90 tablet 3   Multiple Vitamin (MULTIVITAMIN WITH MINERALS) TABS tablet Take 1 tablet by mouth daily.     pantoprazole (PROTONIX) 20 MG tablet Take 1 tablet (20  mg total) by mouth daily. 90 tablet 3   potassium chloride SA (KLOR-CON) 20 MEQ tablet Take 1 tablet (20 mEq total) by mouth daily. 90 tablet 3   Propylene Glycol (SYSTANE BALANCE OP) Place 1 drop into both eyes every 6 (six) hours as needed (dry eyes).     rosuvastatin (CRESTOR) 20 MG tablet Take 1 tablet (20 mg total) by mouth daily. 90 tablet 3   traMADol (ULTRAM) 50 MG tablet Take 1 tablet (50 mg total) by mouth every 6 (six) hours as needed for moderate pain or severe pain. 15 tablet 0   Vitamin D, Cholecalciferol, 1000 units CAPS Take 1,000 Units by mouth daily.      No current facility-administered medications for this encounter.     REVIEW OF SYSTEMS: On review of systems, the patient reports that she is doing well overall. She is healing nicely and denies any concerns with her breast. She in fact had less bruising from lumpectomy than from her breast biopsy.  No specific complaints are otherwise noted.    PHYSICAL EXAM:  Unable to assess due to encounter type.   ECOG = 0  0 - Asymptomatic (Fully active, able to carry on all predisease activities without restriction)  1 - Symptomatic but completely ambulatory  (Restricted in physically strenuous activity but ambulatory and able to carry out work of a light or sedentary nature. For example, light housework, office work)  2 - Symptomatic, <50% in bed during the day (Ambulatory and capable of all self care but unable to carry out any work activities. Up and about more than 50% of waking hours)  3 - Symptomatic, >50% in bed, but not bedbound (Capable of only limited self-care, confined to bed or chair 50% or more of waking hours)  4 - Bedbound (Completely disabled. Cannot carry on any self-care. Totally confined to bed or chair)  5 - Death   Eustace Pen MM, Creech RH, Tormey DC, et al. [phone]). "Toxicity and response criteria of the [facility] Group". [place] Oncol. 5 (6): 649-55    LABORATORY DATA:  Lab Results  Component Value Date   WBC 8.7 03/17/2020   HGB 15.2 (H) 03/17/2020   HCT 46.6 (H) 03/17/2020   MCV 95.1 03/17/2020   PLT 275 03/17/2020   Lab Results  Component Value Date   NA 142 03/17/2020   K 3.8 03/17/2020   CL 103 03/17/2020   CO2 29 03/17/2020   Lab Results  Component Value Date   ALT 14 02/18/2020   AST 20 02/18/2020   ALKPHOS 67 02/18/2020   BILITOT 0.7 02/18/2020      RADIOGRAPHY: MM Breast Surgical Specimen  Result Date: 03/23/2020 CLINICAL DATA:  Status post excision of the left breast following radioactive seed localization. EXAM: SPECIMEN RADIOGRAPH OF THE LEFT BREAST COMPARISON:  Previous exam(s). FINDINGS: Status post excision of the left breast. The radioactive seed and biopsy marker clip are present, completely intact, and were marked for pathology. IMPRESSION: Specimen radiograph of the left breast. Electronically Signed   By: Lajean Manes M.D.   On: 03/23/2020 10:59   MM LT RADIOACTIVE SEED LOC MAMMO GUIDE  Result Date: 03/22/2020 CLINICAL DATA:  Localization prior to surgery EXAM: MAMMOGRAPHIC GUIDED RADIOACTIVE SEED LOCALIZATION OF THE LEFT BREAST COMPARISON:  Previous exam(s).  FINDINGS: Patient presents for radioactive seed localization prior to surgery. I met with the patient and we discussed the procedure of seed localization including benefits and alternatives. We discussed the high likelihood of  a successful procedure. We discussed the risks of the procedure including infection, bleeding, tissue injury and further surgery. We discussed the low dose of radioactivity involved in the procedure. Informed, written consent was given. The usual time-out protocol was performed immediately prior to the procedure. Using mammographic guidance, sterile technique, 1% lidocaine and an I-125 radioactive seed, the biopsy clip was localized using a superior approach. The follow-up mammogram images confirm the seed in the expected location and were marked for the surgeon. Follow-up survey of the patient confirms presence of the radioactive seed. Order number of I-125 seed:  [id number]. Total activity:  0.104 millicuries reference Date: March 09, 2020 The patient tolerated the procedure well and was released from the [facility]. She was given instructions regarding seed removal. IMPRESSION: Radioactive seed localization left breast. No apparent complications. Electronically Signed   By: Dorise Bullion III M.D   On: 03/22/2020 15:39       IMPRESSION/PLAN: 1. Stage IA, [id number] grade 2 ER/PR positive invasive lobular carcinoma of the left breast. Dr. Lisbeth Renshaw discusses the final pathology findings and reviewed the nature of left breast disease. We reviewed the rationale for adjuvant external radiotherapy to the breast followed by antiestrogen therapy, as well as scenarios for which patients can forgo radiotherapy. The patient is interested rather in being aggressive for her treatment and prefers to move forward with adjuvant radiotherapy. We discussed the risks, benefits, short, and long term effects of radiotherapy. Dr. Lisbeth Renshaw discusses the delivery and logistics of radiotherapy and recommends 4  weeks of radiotherapy to the left breast with deep inspiration breath-hold technique. She will come in on Thursday morning for simulation at which time she will sign written consent to proceed.   Given current concerns for patient exposure during the COVID-19 pandemic, this encounter was conducted via telephone.  The patient has provided two factor identification and has given verbal consent for this type of encounter and has been advised to only accept a meeting of this type in a secure network environment. The time spent during this encounter was 45 minutes including preparation, discussion, and coordination of the patient's care. The attendants for this meeting include  Dr. Lisbeth Renshaw, Hayden Pedro  and [name].  During the encounter,  Dr. Lisbeth Renshaw, and Hayden Pedro were located at [facility] Radiation Oncology Department.  Sheila Shields was located at home.    The above documentation reflects my direct findings during this shared patient visit. Please see the separate note by Dr. Lisbeth Renshaw on this date for the remainder of the patient's plan of care.    Carola Rhine, PAC

## 2020-04-22 ENCOUNTER — Other Ambulatory Visit: Payer: Self-pay

## 2020-04-22 ENCOUNTER — Ambulatory Visit
Admission: RE | Admit: 2020-04-22 | Discharge: 2020-04-22 | Disposition: A | Discharge status: Home / Self Care | Payer: Medicare HMO | Source: Ambulatory Visit | Attending: Radiation Oncology | Admitting: Radiation Oncology

## 2020-04-22 DIAGNOSIS — Z170 Estrogen receptor positive status [ER+]: Secondary | ICD-10-CM | POA: Diagnosis not present

## 2020-04-22 DIAGNOSIS — Z510 Encounter for antineoplastic radiation therapy: Secondary | ICD-10-CM | POA: Insufficient documentation

## 2020-04-22 DIAGNOSIS — C50412 Malignant neoplasm of upper-outer quadrant of left female breast: Secondary | ICD-10-CM | POA: Diagnosis not present

## 2020-04-23 DIAGNOSIS — C50412 Malignant neoplasm of upper-outer quadrant of left female breast: Secondary | ICD-10-CM | POA: Diagnosis not present

## 2020-04-23 DIAGNOSIS — Z510 Encounter for antineoplastic radiation therapy: Secondary | ICD-10-CM | POA: Diagnosis not present

## 2020-04-26 ENCOUNTER — Encounter: Payer: Self-pay | Admitting: *Deleted

## 2020-04-27 ENCOUNTER — Telehealth: Payer: Self-pay | Admitting: Hematology

## 2020-04-27 ENCOUNTER — Other Ambulatory Visit: Payer: Self-pay | Admitting: Family Medicine

## 2020-04-27 NOTE — Telephone Encounter (Signed)
Scheduled appt per 10/18 sch msg - mailed reminder letter with appt date and time

## 2020-04-29 ENCOUNTER — Ambulatory Visit
Admission: RE | Admit: 2020-04-29 | Discharge: 2020-04-29 | Disposition: A | Discharge status: Home / Self Care | Payer: Medicare HMO | Source: Ambulatory Visit | Attending: Radiation Oncology | Admitting: Radiation Oncology

## 2020-04-29 DIAGNOSIS — C50412 Malignant neoplasm of upper-outer quadrant of left female breast: Secondary | ICD-10-CM | POA: Diagnosis not present

## 2020-04-29 DIAGNOSIS — Z510 Encounter for antineoplastic radiation therapy: Secondary | ICD-10-CM | POA: Diagnosis not present

## 2020-04-29 NOTE — Progress Notes (Signed)
Pt here for patient teaching.  Pt given Radiation and You booklet, skin care instructions, Alra deodorant and Radiaplex gel.  Reviewed areas of pertinence such as fatigue, hair loss, skin changes, breast tenderness and breast swelling . Pt able to give teach back of to pat skin and use unscented/gentle soap,apply Radiaplex bid, avoid applying anything to skin within 4 hours of treatment, avoid wearing an under wire bra and to use an electric razor if they must shave. Pt verbalizes understanding of information given and will contact nursing with any questions or concerns.     Leyton Brownlee M. Hillery Bhalla RN, BSN      

## 2020-04-30 ENCOUNTER — Encounter: Payer: Self-pay | Admitting: Family Medicine

## 2020-04-30 ENCOUNTER — Ambulatory Visit
Admission: RE | Admit: 2020-04-30 | Discharge: 2020-04-30 | Disposition: A | Discharge status: Home / Self Care | Payer: Medicare HMO | Source: Ambulatory Visit | Attending: Radiation Oncology | Admitting: Radiation Oncology

## 2020-04-30 DIAGNOSIS — Z510 Encounter for antineoplastic radiation therapy: Secondary | ICD-10-CM | POA: Diagnosis not present

## 2020-04-30 DIAGNOSIS — C50412 Malignant neoplasm of upper-outer quadrant of left female breast: Secondary | ICD-10-CM

## 2020-04-30 DIAGNOSIS — Z170 Estrogen receptor positive status [ER+]: Secondary | ICD-10-CM

## 2020-04-30 MED ORDER — ALRA NON-METALLIC DEODORANT (RAD-ONC)
1.0000 "application " | Freq: Once | TOPICAL | Status: AC
  Administered 2020-04-30: 1 via TOPICAL

## 2020-04-30 MED ORDER — RADIAPLEXRX EX GEL: Freq: Once | CUTANEOUS | Status: AC

## 2020-05-03 ENCOUNTER — Ambulatory Visit
Admission: RE | Admit: 2020-05-03 | Discharge: 2020-05-03 | Disposition: A | Discharge status: Home / Self Care | Payer: Medicare HMO | Source: Ambulatory Visit | Attending: Radiation Oncology | Admitting: Radiation Oncology

## 2020-05-03 DIAGNOSIS — C50412 Malignant neoplasm of upper-outer quadrant of left female breast: Secondary | ICD-10-CM | POA: Diagnosis not present

## 2020-05-03 DIAGNOSIS — Z510 Encounter for antineoplastic radiation therapy: Secondary | ICD-10-CM | POA: Diagnosis not present

## 2020-05-04 ENCOUNTER — Ambulatory Visit
Admission: RE | Admit: 2020-05-04 | Discharge: 2020-05-04 | Disposition: A | Discharge status: Home / Self Care | Payer: Medicare HMO | Source: Ambulatory Visit | Attending: Radiation Oncology | Admitting: Radiation Oncology

## 2020-05-04 ENCOUNTER — Telehealth (HOSPITAL_COMMUNITY): Payer: Self-pay | Admitting: Radiology

## 2020-05-04 DIAGNOSIS — Z510 Encounter for antineoplastic radiation therapy: Secondary | ICD-10-CM | POA: Diagnosis not present

## 2020-05-04 DIAGNOSIS — C50412 Malignant neoplasm of upper-outer quadrant of left female breast: Secondary | ICD-10-CM | POA: Diagnosis not present

## 2020-05-04 NOTE — Telephone Encounter (Signed)
Called pt, left VM that she is not due for follow-up until December. Will call her next month to schedule. JM

## 2020-05-05 ENCOUNTER — Ambulatory Visit
Admission: RE | Admit: 2020-05-05 | Discharge: 2020-05-05 | Disposition: A | Discharge status: Home / Self Care | Payer: Medicare HMO | Source: Ambulatory Visit | Attending: Radiation Oncology | Admitting: Radiation Oncology

## 2020-05-05 DIAGNOSIS — C50412 Malignant neoplasm of upper-outer quadrant of left female breast: Secondary | ICD-10-CM | POA: Diagnosis not present

## 2020-05-05 DIAGNOSIS — Z510 Encounter for antineoplastic radiation therapy: Secondary | ICD-10-CM | POA: Diagnosis not present

## 2020-05-06 ENCOUNTER — Ambulatory Visit
Admission: RE | Admit: 2020-05-06 | Discharge: 2020-05-06 | Disposition: A | Discharge status: Home / Self Care | Payer: Medicare HMO | Source: Ambulatory Visit | Attending: Radiation Oncology | Admitting: Radiation Oncology

## 2020-05-06 DIAGNOSIS — C50412 Malignant neoplasm of upper-outer quadrant of left female breast: Secondary | ICD-10-CM | POA: Diagnosis not present

## 2020-05-06 DIAGNOSIS — Z510 Encounter for antineoplastic radiation therapy: Secondary | ICD-10-CM | POA: Diagnosis not present

## 2020-05-07 ENCOUNTER — Ambulatory Visit
Admission: RE | Admit: 2020-05-07 | Discharge: 2020-05-07 | Disposition: A | Discharge status: Home / Self Care | Payer: Medicare HMO | Source: Ambulatory Visit | Attending: Radiation Oncology | Admitting: Radiation Oncology

## 2020-05-07 DIAGNOSIS — C50412 Malignant neoplasm of upper-outer quadrant of left female breast: Secondary | ICD-10-CM | POA: Diagnosis not present

## 2020-05-07 DIAGNOSIS — Z510 Encounter for antineoplastic radiation therapy: Secondary | ICD-10-CM | POA: Diagnosis not present

## 2020-05-10 ENCOUNTER — Ambulatory Visit
Admission: RE | Admit: 2020-05-10 | Discharge: 2020-05-10 | Disposition: A | Discharge status: Home / Self Care | Payer: Medicare HMO | Source: Ambulatory Visit | Attending: Radiation Oncology | Admitting: Radiation Oncology

## 2020-05-10 ENCOUNTER — Other Ambulatory Visit: Payer: Self-pay

## 2020-05-10 DIAGNOSIS — C50412 Malignant neoplasm of upper-outer quadrant of left female breast: Secondary | ICD-10-CM | POA: Diagnosis not present

## 2020-05-10 DIAGNOSIS — Z510 Encounter for antineoplastic radiation therapy: Secondary | ICD-10-CM | POA: Diagnosis not present

## 2020-05-10 DIAGNOSIS — Z170 Estrogen receptor positive status [ER+]: Secondary | ICD-10-CM | POA: Diagnosis not present

## 2020-05-11 ENCOUNTER — Ambulatory Visit
Admission: RE | Admit: 2020-05-11 | Discharge: 2020-05-11 | Disposition: A | Discharge status: Home / Self Care | Payer: Medicare HMO | Source: Ambulatory Visit | Attending: Radiation Oncology | Admitting: Radiation Oncology

## 2020-05-11 DIAGNOSIS — C50412 Malignant neoplasm of upper-outer quadrant of left female breast: Secondary | ICD-10-CM | POA: Diagnosis not present

## 2020-05-11 DIAGNOSIS — Z510 Encounter for antineoplastic radiation therapy: Secondary | ICD-10-CM | POA: Diagnosis not present

## 2020-05-12 ENCOUNTER — Ambulatory Visit
Admission: RE | Admit: 2020-05-12 | Discharge: 2020-05-12 | Disposition: A | Discharge status: Home / Self Care | Payer: Medicare HMO | Source: Ambulatory Visit | Attending: Radiation Oncology | Admitting: Radiation Oncology

## 2020-05-12 DIAGNOSIS — Z510 Encounter for antineoplastic radiation therapy: Secondary | ICD-10-CM | POA: Diagnosis not present

## 2020-05-12 DIAGNOSIS — C50412 Malignant neoplasm of upper-outer quadrant of left female breast: Secondary | ICD-10-CM | POA: Diagnosis not present

## 2020-05-13 ENCOUNTER — Ambulatory Visit
Admission: RE | Admit: 2020-05-13 | Discharge: 2020-05-13 | Disposition: A | Discharge status: Home / Self Care | Payer: Medicare HMO | Source: Ambulatory Visit | Attending: Radiation Oncology | Admitting: Radiation Oncology

## 2020-05-13 DIAGNOSIS — Z510 Encounter for antineoplastic radiation therapy: Secondary | ICD-10-CM | POA: Diagnosis not present

## 2020-05-13 DIAGNOSIS — C50412 Malignant neoplasm of upper-outer quadrant of left female breast: Secondary | ICD-10-CM | POA: Diagnosis not present

## 2020-05-14 ENCOUNTER — Other Ambulatory Visit: Payer: Self-pay

## 2020-05-14 ENCOUNTER — Ambulatory Visit
Admission: RE | Admit: 2020-05-14 | Discharge: 2020-05-14 | Disposition: A | Discharge status: Home / Self Care | Payer: Medicare HMO | Source: Ambulatory Visit | Attending: Radiation Oncology | Admitting: Radiation Oncology

## 2020-05-14 ENCOUNTER — Ambulatory Visit: Payer: Medicare HMO | Admitting: Radiation Oncology

## 2020-05-14 DIAGNOSIS — Z510 Encounter for antineoplastic radiation therapy: Secondary | ICD-10-CM | POA: Diagnosis not present

## 2020-05-14 DIAGNOSIS — C50412 Malignant neoplasm of upper-outer quadrant of left female breast: Secondary | ICD-10-CM | POA: Diagnosis not present

## 2020-05-17 ENCOUNTER — Ambulatory Visit
Admission: RE | Admit: 2020-05-17 | Discharge: 2020-05-17 | Disposition: A | Discharge status: Home / Self Care | Payer: Medicare HMO | Source: Ambulatory Visit | Attending: Radiation Oncology | Admitting: Radiation Oncology

## 2020-05-17 DIAGNOSIS — Z510 Encounter for antineoplastic radiation therapy: Secondary | ICD-10-CM | POA: Diagnosis not present

## 2020-05-17 DIAGNOSIS — C50412 Malignant neoplasm of upper-outer quadrant of left female breast: Secondary | ICD-10-CM | POA: Diagnosis not present

## 2020-05-18 ENCOUNTER — Ambulatory Visit
Admission: RE | Admit: 2020-05-18 | Discharge: 2020-05-18 | Disposition: A | Discharge status: Home / Self Care | Payer: Medicare HMO | Source: Ambulatory Visit | Attending: Radiation Oncology | Admitting: Radiation Oncology

## 2020-05-18 DIAGNOSIS — C50412 Malignant neoplasm of upper-outer quadrant of left female breast: Secondary | ICD-10-CM | POA: Diagnosis not present

## 2020-05-18 DIAGNOSIS — Z510 Encounter for antineoplastic radiation therapy: Secondary | ICD-10-CM | POA: Diagnosis not present

## 2020-05-19 ENCOUNTER — Ambulatory Visit
Admission: RE | Admit: 2020-05-19 | Discharge: 2020-05-19 | Disposition: A | Discharge status: Home / Self Care | Payer: Medicare HMO | Source: Ambulatory Visit | Attending: Radiation Oncology | Admitting: Radiation Oncology

## 2020-05-19 DIAGNOSIS — Z510 Encounter for antineoplastic radiation therapy: Secondary | ICD-10-CM | POA: Diagnosis not present

## 2020-05-19 DIAGNOSIS — C50412 Malignant neoplasm of upper-outer quadrant of left female breast: Secondary | ICD-10-CM | POA: Diagnosis not present

## 2020-05-20 ENCOUNTER — Ambulatory Visit
Admission: RE | Admit: 2020-05-20 | Discharge: 2020-05-20 | Disposition: A | Discharge status: Home / Self Care | Payer: Medicare HMO | Source: Ambulatory Visit | Attending: Radiation Oncology | Admitting: Radiation Oncology

## 2020-05-20 DIAGNOSIS — C50412 Malignant neoplasm of upper-outer quadrant of left female breast: Secondary | ICD-10-CM | POA: Diagnosis not present

## 2020-05-20 DIAGNOSIS — Z510 Encounter for antineoplastic radiation therapy: Secondary | ICD-10-CM | POA: Diagnosis not present

## 2020-05-21 ENCOUNTER — Ambulatory Visit
Admission: RE | Admit: 2020-05-21 | Discharge: 2020-05-21 | Disposition: A | Discharge status: Home / Self Care | Payer: Medicare HMO | Source: Ambulatory Visit | Attending: Radiation Oncology | Admitting: Radiation Oncology

## 2020-05-21 DIAGNOSIS — Z510 Encounter for antineoplastic radiation therapy: Secondary | ICD-10-CM | POA: Diagnosis not present

## 2020-05-21 DIAGNOSIS — C50412 Malignant neoplasm of upper-outer quadrant of left female breast: Secondary | ICD-10-CM | POA: Diagnosis not present

## 2020-05-21 NOTE — Progress Notes (Signed)
[facility]   [phone]) [phone] Fax:([phone]) [phone]   Clinic Follow up Note   Patient Care Team: Marin Olp, MD as PCP - General (Family Medicine) Pieter Partridge, DO as Consulting Physician (Neurology) Collene Gobble, MD as Consulting Physician (Pulmonary Disease) Garrison, P.A. as Consulting Physician Jarome Matin, MD as Consulting Physician (Dermatology) Luanne Bras, MD as Consulting Physician (Interventional Radiology) Mauro Kaufmann, RN as Oncology Nurse Navigator Rockwell Germany, RN as Oncology Nurse Navigator Truitt Merle, MD as Consulting Physician (Hematology) [name], MD as Consulting Physician (General Surgery) Kyung Rudd, MD as Consulting Physician (Radiation Oncology)  Date of Service:  05/26/2020  CHIEF COMPLAINT: F/u of left breast cancer   SUMMARY OF ONCOLOGIC HISTORY: Oncology History Overview Note  Cancer Staging Malignant neoplasm of upper-outer quadrant of left breast in female, estrogen receptor positive ([place]) Staging form: Breast, AJCC 8th Edition - Clinical stage from 02/09/2020: Stage IA (cT1b, cN0, cM0, G2, ER+, PR+, HER2-) - Signed by Truitt Merle, MD on 02/17/2020    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    02/09/2020 Receptors her2   PROGNOSTIC INDICATORS Results: IMMUNOHISTOCHEMICAL AND MORPHOMETRIC ANALYSIS PERFORMED MANUALLY The tumor cells are NEGATIVE for Her2 (1+). Estrogen Receptor: 95%, POSITIVE, STRONG STAINING INTENSITY Progesterone Receptor: 90%, POSITIVE, MODERATE STAINING INTENSITY Proliferation Marker Ki67: 15%   02/09/2020 Cancer Staging   Staging form: Breast, AJCC 8th Edition - Clinical stage from 02/09/2020: Stage IA (cT1b, cN0, cM0, G2, ER+, PR+, HER2-) - Signed by Truitt Merle, MD on 02/17/2020   02/12/2020  Initial Diagnosis   Malignant neoplasm of upper-outer quadrant of left breast in female, estrogen receptor positive ([place])   02/16/2020 Initial Biopsy   Diagnosis Breast, left, needle core biopsy, 12:30, 3 cmfn - INVASIVE MAMMARY CARCINOMA, SEE COMMENT. - MAMMARY CARCINOMA IN SITU. Microscopic Comment The carcinoma appears grade 2 and measures 11 mm in greatest linear extent. E-cadherin will be ordered. Prognostic makers will be ordered. Dr. Saralyn Pilar has reviewed the case. The case was called to [facility] on 02/10/2020.   03/23/2020 Surgery   LEFT BREAST LUMPECTOMY WITH RADIOACTIVE SEED LOCALIZATION by Dr Barry Dienes     03/23/2020 Pathology Results   FINAL MICROSCOPIC DIAGNOSIS:   A. BREAST, LEFT, LUMPECTOMY:  - Invasive lobular carcinoma, 1.1 cm, Nottingham grade 2 of 3.  - Invasive carcinoma focally involves the medial margin.  - Biopsy clip site.  - See oncology table.   B. BREAST, LEFT, ADDITIONAL MEDIAL MARGIN, EXCISION:  - Breast tissue; no invasive carcinoma identified.   C. BREAST, LEFT, ADDITIONAL SUPERIOR MARGIN, EXCISION:  - Breast tissue; no invasive carcinoma identified.   D. BREAST, LEFT, ADDITIONAL POSTERIOR MARGIN, EXCISION:  - Breast tissue; no invasive carcinoma identified.    03/23/2020 Cancer Staging   Staging form: Breast, AJCC 8th Edition - Pathologic stage from 03/23/2020: Stage Unknown (pT1c, pNX, cM0, G2, ER+, PR+, HER2-) - Signed by Truitt Merle, MD on 05/25/2020   04/29/2020 - 05/26/2020 Radiation Therapy   Adjuvant Radiation with [name] 04/29/20-05/26/20    06/2020 -  Anti-estrogen oral therapy   Tamoxifen 39m once daily starting 06/2020      CURRENT THERAPY:  Tamoxifen 215monce daily starting 06/2020   INTERVAL HISTORY:  Sheila Shields here for a follow up. She presents to the clinic  alone with her husband. She notes she continues to f/u with her surgeon given her left breast seroma. She plans to have aspiration next week.  She notes RT has is going well. She has mild skin irritation, no skin peeling. She has mild fatigue that has not stopped her from daily activities. She notes she has arthritis and her bone density is normal.    REVIEW OF SYSTEMS:   Constitutional: Denies fevers, chills or abnormal weight loss (+) Mild fatigue  Eyes: Denies blurriness of vision Ears, nose, mouth, throat, and face: Denies mucositis or sore throat Respiratory: Denies cough, dyspnea or wheezes Cardiovascular: Denies palpitation, chest discomfort or lower extremity swelling Gastrointestinal:  Denies nausea, heartburn or change in bowel habits Skin: Denies abnormal skin rashes (+) Mild skin irritation of left breast from RT.  MSK: (+) Arthritis  Lymphatics: Denies new lymphadenopathy or easy bruising Neurological:Denies numbness, tingling or new weaknesses Behavioral/Psych: Mood is stable, no new changes  All other systems were reviewed with the patient and are negative.  MEDICAL HISTORY:  Past Medical History:  Diagnosis Date  . Alcoholism in remission ([place]) 08/27/2007  . Anemia   . Arthritis    OA  . Breast cancer ([place])    left breast  . COPD 12/17/2006  . Diverticulosis 03/27/2007  . GERD 05/01/2007  . HYPERGLYCEMIA 08/27/2007  . HYPERLIPIDEMIA 12/17/2006  . HYPERTENSION 12/17/2006  . Mood disorder ([facility])    hx anxiety and depression. meds short term during life transition  . Pneumonia    AS A CHILD  . Rectal polyp 03/27/2007   adenoma  . SOB (shortness of breath) on exertion    per patient due to having COPD  . TIA (transient ischemic attack)     SURGICAL HISTORY: Past Surgical History:  Procedure Laterality Date  . ABDOMINAL HYSTERECTOMY  1978   fibroma  . APPENDECTOMY  2002  . BREAST EXCISIONAL BIOPSY Right 2003   negative  . BREAST LUMPECTOMY WITH RADIOACTIVE SEED LOCALIZATION Left 03/23/2020   Procedure: LEFT BREAST LUMPECTOMY WITH RADIOACTIVE SEED LOCALIZATION;  Surgeon: [name], MD;  Location: [facility];   Service: General;  Laterality: Left;  RNFA  . CARPAL TUNNEL RELEASE Left 2010 or 2011  . CATARACT EXTRACTION W/ INTRAOCULAR LENS  IMPLANT, BILATERAL Bilateral   . COLONOSCOPY W/ POLYPECTOMY  03/27/2007; n7/19/2012   2008: 4 mm adenoma, diverticulosis 2012: ileitis ? NSAID - likely, 2-3 mm cecal polyp LYMPHOID FOLLICLE, diverticulosis  . ESOPHAGOGASTRODUODENOSCOPY  01/26/2011   reflux esophagitis, colonscopy done also  . HAMMER TOE SURGERY  2008   left foot  . HERNIA REPAIR Right 1996?  . IR ANGIO INTRA EXTRACRAN SEL COM CAROTID INNOMINATE UNI R MOD SED  11/28/2018  . IR ANGIO INTRA EXTRACRAN SEL INTERNAL CAROTID UNI R MOD SED  12/30/2018  . IR ANGIO VERTEBRAL SEL SUBCLAVIAN INNOMINATE UNI R MOD SED  11/28/2018  . IR ANGIOGRAM FOLLOW UP STUDY  12/30/2018  . IR TRANSCATH/EMBOLIZ  12/30/2018  . IR US GUIDE VASC ACCESS RIGHT  11/28/2018  . ORIF ANKLE FRACTURE Right 12/22/2014   Procedure: OPEN REDUCTION INTERNAL FIXATION (ORIF) ANKLE FRACTURE;  Surgeon: Susa Day, MD;  Location: [facility];  Service: Orthopedics;  Laterality: Right;  . RADIOLOGY WITH ANESTHESIA N/A 12/30/2018   Procedure: RADIOLOGY WITH ANESTHESIA;  Surgeon: Luanne Bras, MD;  Location: [facility];  Service: Radiology;  Laterality: N/A;  . SHOULDER OPEN ROTATOR CUFF REPAIR Left 03/06/2013   Procedure: LEFT ROTATOR CUFF REPAIR, SUBACROMIAL DECOMPRESSION, PATCH GRAFT, MANIPULATION  UNDER ANESTHESIA;  Surgeon: Johnn Hai, MD;  Location: [facility];  Service: Orthopedics;  Laterality: Left;  . TONSILLECTOMY      I have reviewed the social history and family history with the patient and they are unchanged from previous note.  ALLERGIES:  is allergic to alcohol, codeine, and dilaudid [hydromorphone hcl].  MEDICATIONS:  Current Outpatient Medications  Medication Sig Dispense Refill  . acetaminophen ([employer] ARTHRITIS PAIN RELIEF) 650 MG CR tablet Take 1,300 mg by mouth 2 (two) times daily.    Marland Kitchen albuterol (PROAIR HFA) 108 (90 Base) MCG/ACT  inhaler INHALE 2 PUFFS EVERY 4 HOURS AS NEEDED FOR COUGHING SPELLS 18 g 5  . aspirin 325 MG tablet Take 1 tablet (325 mg total) by mouth daily. 30 tablet 3  . clopidogrel (PLAVIX) 75 MG tablet Take 35.5 mg by mouth daily.    . Coenzyme Q10 (COQ10) 100 MG CAPS Take 300 capsules by mouth daily.     . diclofenac Sodium (VOLTAREN) 1 % GEL Apply 2 g topically 4 (four) times daily. 100 g 11  . ferrous sulfate 325 (65 FE) MG tablet TAKE 1 TABLET BY MOUTH EVERY DAY (Patient taking differently: Take 325 mg by mouth daily with breakfast. ) 90 tablet 3  . fexofenadine (ALLEGRA) 180 MG tablet Take 180 mg by mouth daily.    . fluticasone (FLONASE) 50 MCG/ACT nasal spray Place 2 sprays into both nostrils daily. (Patient taking differently: Place 2 sprays into both nostrils daily as needed. ) 16 g 2  . hydrochlorothiazide (HYDRODIURIL) 25 MG tablet TAKE ONE TABLET BY MOUTH DAILY 90 tablet 3  . KLOR-CON M20 20 MEQ tablet TAKE ONE TABLET BY MOUTH DAILY 90 tablet 3  . Multiple Vitamin (MULTIVITAMIN WITH MINERALS) TABS tablet Take 1 tablet by mouth daily.    . pantoprazole (PROTONIX) 20 MG tablet TAKE ONE TABLET BY MOUTH DAILY 90 tablet 3  . Propylene Glycol (SYSTANE BALANCE OP) Place 1 drop into both eyes every 6 (six) hours as needed (dry eyes).    . rosuvastatin (CRESTOR) 20 MG tablet Take 1 tablet (20 mg total) by mouth daily. 90 tablet 3  . tamoxifen (NOLVADEX) 20 MG tablet Take 1 tablet (20 mg total) by mouth daily. 30 tablet 3  . traZODone (DESYREL) 50 MG tablet Take 0.5-1 tablets (25-50 mg total) by mouth at bedtime as needed for sleep. 30 tablet 3  . Vitamin D, Cholecalciferol, 1000 units CAPS Take 1,000 Units by mouth daily.      No current facility-administered medications for this visit.    PHYSICAL EXAMINATION: ECOG PERFORMANCE STATUS: 0 - Asymptomatic  Vitals:   05/26/20 1344  BP: 109/88  Pulse: 82  Resp: 19  Temp: 97.6 F (36.4 C)  SpO2: 91%   Filed Weights   05/26/20 1344  Weight: 198  lb 9.6 oz (90.1 kg)    Due to COVID19 we will limit examination to appearance. Patient had no complaints.  GENERAL:alert, no distress and comfortable SKIN: skin color normal, no rashes or significant lesions EYES: normal, Conjunctiva are pink and non-injected, sclera clear  NEURO: alert & oriented x 3 with fluent speech   LABORATORY DATA:  I have reviewed the data as listed CBC Latest Ref Rng & Units 05/24/2020 03/17/2020 02/18/2020  WBC 3.8 - 10.8 Thousand/uL 8.3 8.7 9.4  Hemoglobin 11.7 - 15.5 g/dL 14.8 15.2(H) 14.5  Hematocrit 35 - 45 % 43.5 46.6(H) 43.9  Platelets 140 - 400 Thousand/uL 261 275 287  CMP Latest Ref Rng & Units 05/24/2020 03/17/2020 02/18/2020  Glucose 65 - 99 mg/dL 122(H) 105(H) 149(H)  BUN 7 - 25 mg/dL '17 17 16  ' Creatinine 0.60 - 0.88 mg/dL 0.76 0.86 0.85  Sodium 135 - 146 mmol/L 142 142 143  Potassium 3.5 - 5.3 mmol/L 3.5 3.8 3.7  Chloride 98 - 110 mmol/L 105 103 106  CO2 20 - 32 mmol/L '30 29 27  ' Calcium 8.6 - 10.4 mg/dL 9.9 10.1 10.3  Total Protein 6.1 - 8.1 g/dL 6.1 - 6.9  Total Bilirubin 0.2 - 1.2 mg/dL 0.6 - 0.7  Alkaline Phos 38 - 126 U/L - - 67  AST 10 - 35 U/L 18 - 20  ALT 6 - 29 U/L 13 - 14      RADIOGRAPHIC STUDIES: I have personally reviewed the radiological images as listed and agreed with the findings in the report. No results found.   ASSESSMENT & PLAN:  Sheila Shields is a 85 y.o. female with    1. Malignant neoplasm of upper-outer quadrant of left breast, Stage IA, pT1cN0M0, lobular carcinoma, ER+/PR+/HER2-, Grade II -She was diagnosed in 02/2020 with a 1m mass in her left breast with invasive lobular carcinoma with components of LCIS.  -She underwent left lumpectomy with Dr BBarry Dieneson 03/23/20. Surgical path showed her 1.1cm mass was completely resected with clear margins, node negative. Given early stage cancer and advanced age, chemo was not recommended.  -To reduce her risk of local recurrence she proceeded with adjuvant Rt with Dr  MLisbeth Renshawstarting 04/29/20. She plans to complete on 05/26/20. She is tolerating well with mild fatigue and mild skin irration.  -Given the strong ER and PR expression, I recommend adjuvant endocrine therapy with Tamoxifen (given her arthritis) for a total of 5 years to reduce the risk of cancer recurrence.   -The potential side effects: which includes but not limited to, hot flash, skin and vaginal dryness, slightly increased risk of cardiovascular disease and cataract, small risk of thrombosis and endometrial cancer, were discussed with her in great details. Preventive strategies for thrombosis, such as being physically active, using compression stocks, avoid cigarette smoking, etc., were reviewed with her. I also recommend her to follow-up with her gynecologist once a year, and watch for vaginal spotting or bleeding, as a clinically sign of endometrial cancer, etc. She voiced good understanding, and agrees to proceed. Will start after she completes adjuvant breast radiation in 06/2020.  -We also discussed the breast cancer surveillance after her surgery. She will continue annual screening mammogram, self exams, and a routine office visit with lab and exam with [place]  -She will proceed with Survivorship clinic with NP Lacie in 3 months, F/u with Dr BBarry Dienesin 6 months and F/u with me in 6 months.   2. Comorbidities: Brain Aneurysm, HTN, HLD, GERD, COPD, Osteoarthritis -Her brain aneurysm was banded in 12/2018. She continues to f/u with Dr DVickey Sages She is on 3285maspirin.  -She did have very mild TIA and is seen by Dr JaTana FeltsShe is also on half tablet Plavix due to easy bruising.  -On HCTZ, Protonix, Crestor. Her COPD is well controlled.  -She has OA in her hands and back along with herniated disc, scoliosis and spinal stenosis. This is controlled on Tylenol.   3. H/o of alcohol and nicotine use.  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 She was also a heavy smoker and quit smoking in  1996.   PLAN:  -I called in  Tamoxifen to start in 06/2020 -Survivorship clinic with NP Lacie in 3 months  -Lab and f/u in 9 months    No problem-specific Assessment & Plan notes found for this encounter.   No orders of the defined types were placed in this encounter.  All questions were answered. The patient knows to call the clinic with any problems, questions or concerns. No barriers to learning was detected. The total time spent in the appointment was 30 minutes.     Truitt Merle, MD 05/26/2020   I, Joslyn Devon, am acting as scribe for Truitt Merle, MD.   I have reviewed the above documentation for accuracy and completeness, and I agree with the above.

## 2020-05-23 NOTE — Progress Notes (Addendum)
Phone [phone]   Subjective:  Patient presents today for their annual physical. Chief complaint-noted.   See problem oriented charting- ROS- full  review of systems was completed and negative except for: chills, sweating, fatigue - radiation related fatigue. Sweating and chills have been an issue her whole life. Ear discharge, post nasal drip, dried blood when blows nose, sneezing at times, joint pain/back pain, seasonal allergies, bruises easily on plavix/asa combo needed for aneurysm  The following were reviewed and entered/updated in epic: Past Medical History:  Diagnosis Date  . Alcoholism in remission ([place]) 08/27/2007  . Anemia   . Arthritis    OA  . Breast cancer ([place])    left breast  . COPD 12/17/2006  . Diverticulosis 03/27/2007  . GERD 05/01/2007  . HYPERGLYCEMIA 08/27/2007  . HYPERLIPIDEMIA 12/17/2006  . HYPERTENSION 12/17/2006  . Mood disorder (HCC)    hx anxiety and depression. meds short term during life transition  . Pneumonia    AS A CHILD  . Rectal polyp 03/27/2007   adenoma  . SOB (shortness of breath) on exertion    per patient due to having COPD  . TIA (transient ischemic attack)    Patient Active Problem List   Diagnosis Date Noted  . Brain aneurysm 12/30/2018    Priority: High  . Chronic cough 10/11/2015    Priority: High  . COPD (chronic obstructive pulmonary disease) ([place]) 11/18/2014    Priority: High  . HYPERGLYCEMIA 08/27/2007    Priority: Medium  . Gastroesophageal reflux disease 05/01/2007    Priority: Medium  . Hyperlipemia 12/17/2006    Priority: Medium  . Essential hypertension 12/17/2006    Priority: Medium  . Former smoker 05/22/2019    Priority: Low  . Aortic atherosclerosis ([place]) 04/11/2018    Priority: Low  . Allergic rhinitis 11/09/2017    Priority: Low  . Arm mass, left 04/16/2017    Priority: Low  . Iron deficiency anemia, unspecified 01/20/2011    Priority: Low  . Arthropathy 02/23/2010    Priority: Low  . Alcoholism in  remission ([place]) 08/27/2007    Priority: Low  . Insomnia 05/24/2020  . Murmur 05/24/2020  . Malignant neoplasm of upper-outer quadrant of left breast in female, estrogen receptor positive ([place]) 02/12/2020   Past Surgical History:  Procedure Laterality Date  . ABDOMINAL HYSTERECTOMY  1978   fibroma  . APPENDECTOMY  2002  . BREAST EXCISIONAL BIOPSY Right 2003   negative  . BREAST LUMPECTOMY WITH RADIOACTIVE SEED LOCALIZATION Left 03/23/2020   Procedure: LEFT BREAST LUMPECTOMY WITH RADIOACTIVE SEED LOCALIZATION;  Surgeon: [name], MD;  Location: [facility];  Service: General;  Laterality: Left;  RNFA  . CARPAL TUNNEL RELEASE Left 2010 or 2011  . CATARACT EXTRACTION W/ INTRAOCULAR LENS  IMPLANT, BILATERAL Bilateral   . COLONOSCOPY W/ POLYPECTOMY  03/27/2007; n7/19/2012   2008: 4 mm adenoma, diverticulosis 2012: ileitis ? NSAID - likely, 2-3 mm cecal polyp LYMPHOID FOLLICLE, diverticulosis  . ESOPHAGOGASTRODUODENOSCOPY  01/26/2011   reflux esophagitis, colonscopy done also  . HAMMER TOE SURGERY  2008   left foot  . HERNIA REPAIR Right 1996?  . IR ANGIO INTRA EXTRACRAN SEL COM CAROTID INNOMINATE UNI R MOD SED  11/28/2018  . IR ANGIO INTRA EXTRACRAN SEL INTERNAL CAROTID UNI R MOD SED  12/30/2018  . IR ANGIO VERTEBRAL SEL SUBCLAVIAN INNOMINATE UNI R MOD SED  11/28/2018  . IR ANGIOGRAM FOLLOW UP STUDY  12/30/2018  . IR TRANSCATH/EMBOLIZ  12/30/2018  . IR US GUIDE  VASC ACCESS RIGHT  11/28/2018  . ORIF ANKLE FRACTURE Right 12/22/2014   Procedure: OPEN REDUCTION INTERNAL FIXATION (ORIF) ANKLE FRACTURE;  Surgeon: Susa Day, MD;  Location: [facility];  Service: Orthopedics;  Laterality: Right;  . RADIOLOGY WITH ANESTHESIA N/A 12/30/2018   Procedure: RADIOLOGY WITH ANESTHESIA;  Surgeon: Luanne Bras, MD;  Location: [facility];  Service: Radiology;  Laterality: N/A;  . SHOULDER OPEN ROTATOR CUFF REPAIR Left 03/06/2013   Procedure: LEFT ROTATOR CUFF REPAIR, SUBACROMIAL DECOMPRESSION, PATCH GRAFT, MANIPULATION  UNDER ANESTHESIA;  Surgeon: Johnn Hai, MD;  Location: [facility];  Service: Orthopedics;  Laterality: Left;  . TONSILLECTOMY      Family History  Problem Relation Age of Onset  . Throat cancer Mother        58  . Heart attack Father 68  . Idiopathic pulmonary fibrosis Brother 90  . Cancer Brother        sarcoma  . Heart disease Brother   . Colon cancer Neg Hx   . Esophageal cancer Neg Hx   . Stomach cancer Neg Hx     Medications- reviewed and updated Current Outpatient Medications  Medication Sig Dispense Refill  . acetaminophen ([employer] ARTHRITIS PAIN RELIEF) 650 MG CR tablet Take 1,300 mg by mouth 2 (two) times daily.    Marland Kitchen albuterol (PROAIR HFA) 108 (90 Base) MCG/ACT inhaler INHALE 2 PUFFS EVERY 4 HOURS AS NEEDED FOR COUGHING SPELLS 18 g 5  . aspirin 325 MG tablet Take 1 tablet (325 mg total) by mouth daily. 30 tablet 3  . clopidogrel (PLAVIX) 75 MG tablet Take 35.5 mg by mouth daily.    . Coenzyme Q10 (COQ10) 100 MG CAPS Take 300 capsules by mouth daily.     . ferrous sulfate 325 (65 FE) MG tablet TAKE 1 TABLET BY MOUTH EVERY DAY (Patient taking differently: Take 325 mg by mouth daily with breakfast. ) 90 tablet 3  . fexofenadine (ALLEGRA) 180 MG tablet Take 180 mg by mouth daily.    . fluticasone (FLONASE) 50 MCG/ACT nasal spray Place 2 sprays into both nostrils daily. (Patient taking differently: Place 2 sprays into both nostrils daily as needed. ) 16 g 2  . hydrochlorothiazide (HYDRODIURIL) 25 MG tablet TAKE ONE TABLET BY MOUTH DAILY 90 tablet 3  . KLOR-CON M20 20 MEQ tablet TAKE ONE TABLET BY MOUTH DAILY 90 tablet 3  . Multiple Vitamin (MULTIVITAMIN WITH MINERALS) TABS tablet Take 1 tablet by mouth daily.    . pantoprazole (PROTONIX) 20 MG tablet TAKE ONE TABLET BY MOUTH DAILY 90 tablet 3  . Propylene Glycol (SYSTANE BALANCE OP) Place 1 drop into both eyes every 6 (six) hours as needed (dry eyes).    . rosuvastatin (CRESTOR) 20 MG tablet Take 1 tablet (20 mg total) by mouth  daily. 90 tablet 3  . Vitamin D, Cholecalciferol, 1000 units CAPS Take 1,000 Units by mouth daily.     . diclofenac Sodium (VOLTAREN) 1 % GEL Apply 2 g topically 4 (four) times daily. 100 g 11  . traZODone (DESYREL) 50 MG tablet Take 0.5-1 tablets (25-50 mg total) by mouth at bedtime as needed for sleep. 30 tablet 3   No current facility-administered medications for this visit.    Allergies-reviewed and updated Allergies  Allergen Reactions  . Alcohol Other (See Comments)    Recovering Alcoholic*  . Codeine     Patient prefers not to take this due to history of alcoholism  . Dilaudid [Hydromorphone Hcl]     Pt  had hypotension after dilaudid 1mg  IV while in the OR, required temporary pressor intervention.    Social History   Social History Narrative   Home Situation: lives with husband. 2 daughters, 2 step daughters, 6 grandkids. 2 greatgrandkids    Work or School: very active in [employer] and church      Spiritual Beliefs: Christian      Lifestyle:tries to walk; diet ok - denies any alcohol or tobacco use in many many years      Caffeine 1 cup coffee/day      Hobbies: news junkie, audio books      Left Handed      Lives in one story home   Objective  Objective:  BP 120/80   Pulse 79   Temp 97.7 F (36.5 C) (Temporal)   Resp (!) 188   Ht 5\' 5"  (1.651 m)   Wt 199 lb 9.6 oz (90.5 kg)   SpO2 91%   BMI 33.22 kg/m  Gen: NAD, resting comfortably HEENT: Mucous membranes are moist. Oropharynx normal Neck: no thyromegaly or cervical lymph nodes noted CV: RRR no  rubs or gallops.  See other note about murmur Lungs: CTAB no crackles, wheeze, rhonchi Abdomen: soft/nontender/nondistended/normal bowel sounds. No rebound or guarding.  Ext: no edema Skin: warm, dry Neuro: grossly normal, moves all extremities, PERRLA   Assessment and Plan   85 y.o. female presenting for annual physical.  Health Maintenance counseling: 1. Anticipatory guidance: Patient counseled regarding  regular dental exams q6 months, eye exams- yearly ,  avoiding smoking and second hand smoke, limiting alcohol to 1 beverage per day-38 years alcohol free- alcoholism in remision.    2. Risk factor reduction:  Advised patient of need for regular exercise and diet rich and fruits and vegetables to reduce risk of heart attack and stroke. Exercise- limited more recently with radiation- encouraged to increase once through treatments. Diet- trying to eat reasonably healthy diet- tires to do fresh veggies, chicken, fish.  Wt Readings from Last 3 Encounters:  05/24/20 199 lb 9.6 oz (90.5 kg)  03/23/20 198 lb (89.8 kg)  03/17/20 198 lb (89.8 kg)  3. Immunizations/screenings/ancillary studies- fully up to date Immunization History  Administered Date(s) Administered  . Fluad Quad(high Dose 65+) 04/04/2019  . Influenza Split 04/09/2012  . Influenza Whole 05/01/2007, 04/06/2008, 04/21/2009, 03/24/2010, 04/10/2011  . Influenza, High Dose Seasonal PF 04/09/2015, 03/17/2016, 04/16/2017, 03/20/2018, 04/10/2020  . Influenza,inj,Quad PF,6+ Mos 03/03/2013  . Influenza,inj,quad, With Preservative 03/20/2018  . Influenza-Unspecified 04/09/2014, 04/09/2015, 04/09/2017, 03/20/2018  . PFIZER SARS-COV-2 Vaccination 07/27/2019, 08/14/2019, 04/30/2020  . Pneumococcal Conjugate-13 05/05/2015  . Pneumococcal Polysaccharide-23 04/09/2004  . Td 12/08/2005  . Tdap 12/22/2014  . Zoster 01/06/2008  . Zoster Recombinat (Shingrix) 08/01/2017, 10/03/2017   4. Cervical cancer screening- past age based screening recommendations. No discharge or blood 5. Breast cancer screening-undergoing treatment for left breast cancer-status post lumpectomy and with ongoing radiation at present- only 3 treatments left. tramadol post surgery- will remove from list.  6. Colon cancer screening - past age based screening recommendations.  Denies blood in stool or melena. Last colonoscopy 2012 and was told no more colonoscopies 7. Skin cancer  screening- Dr. Jarome Matin - back to yearly after 5 years of twice yearly due to melanoma at neck. advised regular sunscreen use. Denies worrisome, changing, or new skin lesions.  8. Birth control/STD check- monogamous/postmenopausal 9. Osteoporosis screening at 24- normal/excellent in 2016.  Takes vitamin D 1000 units/day -Former smoker.  Quit 1980s-no regular screening required  Status of chronic or acute concerns   # social update- Catamaran injury in her son in law- he is doing better and this is helping him, her daughter in law and patient herself. 6 months for him to get back on his feet.   #Brain aneurysm-follows with Dr. Estanislado Pandy. Half dose plavix full dose aspirin. last scan in may and planned again in December- this one wil be with contrast.    #ear discharge-right ear  Discharge at night- normal on exam- wil monitor for new/worsening symptoms. No changes in hearing.   #hyperlipidemia S: Medication: rosuvastatin 20Mg  increased from simvastatin (had issues at first but got better after some time- was having myalgias and arthalgias).  Did not tolerate atorvastatin Lab Results  Component Value Date   CHOL 166 11/19/2019   HDL 40.90 11/19/2019   LDLCALC 98 08/08/2018   LDLDIRECT 79.0 11/19/2019   TRIG 238.0 (H) 11/19/2019   CHOLHDL 4 11/19/2019   A/P: Hopefully improved control-direct LDL will be ordered today-continue current medication for now  # GERD S:Medication: Protonix 20Mg  B12 levels related to PPI use: Lab Results  Component Value Date   [id number] 124 05/22/2019  A/P: Reasonable control-continue current medication We will recheck B12 with ongoing PPI use -consider trying pepcid when not going through radiation  #hypertension S: medication: HCTZ 25Mg .  Also takes potassium at this BP Readings from Last 3 Encounters:  05/24/20 120/80  03/23/20 128/76  03/17/20 (!) 178/96  A/P: Stable. Continue current medications.    #COPD-follow-up with Dr. Lamonte Sakai in past-  has been doing well with albuterol as needed- refill today. Some coughing spells some DOE- albuterol helps.   #Anemia-in May we opted to trial off iron but she has continued-update iron levels today Lab Results  Component Value Date   FERRITIN 54.6 11/19/2019   Recommended follow up: 6 months follow up Future Appointments  Date Time Provider [place]  05/24/2020  3:45 PM [facility] 4 [facility] None  05/25/2020  2:30 PM [facility] LIN[facility] 3 [facility] None  05/26/2020  1:40 PM Truitt Merle, MD [facility] None  05/26/2020  2:30 PM [facility] LIN[facility] 3 [facility] None  08/26/2020  8:50 AM Pieter Partridge, DO [facility] None  11/23/2020 11:00 AM Marin Olp, MD [facility]   Lab/Order associations:non fasting   ICD-10-CM   1. Encounter for general adult medical examination with abnormal findings  Z00.01   2. Insomnia, unspecified type  G47.00   3. Arthropathy  M12.9   4. Murmur  R01.1 ECHOCARDIOGRAM COMPLETE  5. Essential hypertension  I10 CBC With Differential/Platelet    COMPLETE METABOLIC PANEL WITH GFR    COMPLETE METABOLIC PANEL WITH GFR    CBC With Differential/Platelet  6. Gastroesophageal reflux disease, unspecified whether esophagitis present  K21.9   7. Mixed hyperlipidemia  E78.2 CBC With Differential/Platelet    COMPLETE METABOLIC PANEL WITH GFR    LDL cholesterol, direct    LDL cholesterol, direct    COMPLETE METABOLIC PANEL WITH GFR    CBC With Differential/Platelet  8. Simple chronic bronchitis (HCC)  J41.0   9. Aortic atherosclerosis (HCC)  I70.0   10. Alcoholism in remission ([place])  F10.21   11. Iron deficiency anemia, unspecified iron deficiency anemia type  D50.9 Iron, TIBC and Ferritin Panel    Iron, TIBC and Ferritin Panel  12. High risk medication use  Z79.899 Vitamin B12    Vitamin B12   Return precautions advised.  Garret Reddish, MD

## 2020-05-23 NOTE — Patient Instructions (Addendum)
Please stop by lab before you go If you have mychart- we will send your results within 3 business days of [place] receiving them.  If you do not have mychart- we will call you about results within 5 business days of [place] receiving them.  *please note we are currently using [facility] which has a longer processing time than [facility] typically so labs may not come back as quickly as in the past *please also note that you will see labs on mychart as soon as they post. I will later go in and write notes on them- will say "notes from Dr. Yong Channel"  Refill albuterol  Fill voltaren  Trial of trazodone for the tougher nights  We will call you within two weeks about your referral to echocardiogram (slight murmur noted). If you do not hear within 3 weeks, give [place] a call.     also you are due for annual wellness visit with our nurse Otila Kluver- can be by phone or virtual Medicare AWVS: 04/04/2019

## 2020-05-24 ENCOUNTER — Ambulatory Visit
Admission: RE | Admit: 2020-05-24 | Discharge: 2020-05-24 | Disposition: A | Discharge status: Home / Self Care | Payer: Medicare HMO | Source: Ambulatory Visit | Attending: Radiation Oncology | Admitting: Radiation Oncology

## 2020-05-24 ENCOUNTER — Ambulatory Visit (INDEPENDENT_AMBULATORY_CARE_PROVIDER_SITE_OTHER): Payer: Medicare HMO | Admitting: Family Medicine

## 2020-05-24 ENCOUNTER — Encounter: Payer: Self-pay | Admitting: Family Medicine

## 2020-05-24 ENCOUNTER — Other Ambulatory Visit: Payer: Self-pay

## 2020-05-24 VITALS — BP 120/80 | HR 79 | Temp 97.7°F | Resp 188 | Ht 65.0 in | Wt 199.6 lb

## 2020-05-24 DIAGNOSIS — M129 Arthropathy, unspecified: Secondary | ICD-10-CM | POA: Diagnosis not present

## 2020-05-24 DIAGNOSIS — Z0000 Encounter for general adult medical examination without abnormal findings: Secondary | ICD-10-CM

## 2020-05-24 DIAGNOSIS — I700 Atherosclerosis of aorta: Secondary | ICD-10-CM

## 2020-05-24 DIAGNOSIS — Z0001 Encounter for general adult medical examination with abnormal findings: Secondary | ICD-10-CM | POA: Diagnosis not present

## 2020-05-24 DIAGNOSIS — I350 Nonrheumatic aortic (valve) stenosis: Secondary | ICD-10-CM | POA: Insufficient documentation

## 2020-05-24 DIAGNOSIS — Z79899 Other long term (current) drug therapy: Secondary | ICD-10-CM

## 2020-05-24 DIAGNOSIS — R011 Cardiac murmur, unspecified: Secondary | ICD-10-CM

## 2020-05-24 DIAGNOSIS — G4700 Insomnia, unspecified: Secondary | ICD-10-CM | POA: Diagnosis not present

## 2020-05-24 DIAGNOSIS — F1021 Alcohol dependence, in remission: Secondary | ICD-10-CM

## 2020-05-24 DIAGNOSIS — I10 Essential (primary) hypertension: Secondary | ICD-10-CM | POA: Diagnosis not present

## 2020-05-24 DIAGNOSIS — K219 Gastro-esophageal reflux disease without esophagitis: Secondary | ICD-10-CM

## 2020-05-24 DIAGNOSIS — C50412 Malignant neoplasm of upper-outer quadrant of left female breast: Secondary | ICD-10-CM | POA: Diagnosis not present

## 2020-05-24 DIAGNOSIS — D509 Iron deficiency anemia, unspecified: Secondary | ICD-10-CM

## 2020-05-24 DIAGNOSIS — J410 Simple chronic bronchitis: Secondary | ICD-10-CM | POA: Diagnosis not present

## 2020-05-24 DIAGNOSIS — Z510 Encounter for antineoplastic radiation therapy: Secondary | ICD-10-CM | POA: Diagnosis not present

## 2020-05-24 DIAGNOSIS — E782 Mixed hyperlipidemia: Secondary | ICD-10-CM | POA: Diagnosis not present

## 2020-05-24 DIAGNOSIS — R69 Illness, unspecified: Secondary | ICD-10-CM | POA: Diagnosis not present

## 2020-05-24 MED ORDER — ALBUTEROL SULFATE HFA 108 (90 BASE) MCG/ACT IN AERS: INHALATION_SPRAY | RESPIRATORY_TRACT | 5 refills | Status: AC

## 2020-05-24 MED ORDER — TRAZODONE HCL 50 MG PO TABS: 25.0000 mg | ORAL_TABLET | Freq: Every evening | ORAL | 3 refills | Status: DC | PRN

## 2020-05-24 MED ORDER — DICLOFENAC SODIUM 1 % EX GEL: 2.0000 g | Freq: Four times a day (QID) | CUTANEOUS | 11 refills | Status: DC

## 2020-05-24 NOTE — Progress Notes (Addendum)
Phone [phone] In person visit   Subjective:   Sheila Shields is a 85 y.o. year old very pleasant female patient who presents for/with See problem oriented charting  This visit occurred during the SARS-CoV-2 public health emergency.  Safety protocols were in place, including screening questions prior to the visit, additional usage of staff PPE, and extensive cleaning of exam room while observing appropriate contact time as indicated for disinfecting solutions.   Past Medical History-  Patient Active Problem List   Diagnosis Date Noted  . Brain aneurysm 12/30/2018    Priority: High  . Chronic cough 10/11/2015    Priority: High  . COPD (chronic obstructive pulmonary disease) ([place]) 11/18/2014    Priority: High  . HYPERGLYCEMIA 08/27/2007    Priority: Medium  . Gastroesophageal reflux disease 05/01/2007    Priority: Medium  . Hyperlipemia 12/17/2006    Priority: Medium  . Essential hypertension 12/17/2006    Priority: Medium  . Former smoker 05/22/2019    Priority: Low  . Aortic atherosclerosis ([place]) 04/11/2018    Priority: Low  . Allergic rhinitis 11/09/2017    Priority: Low  . Arm mass, left 04/16/2017    Priority: Low  . Iron deficiency anemia, unspecified 01/20/2011    Priority: Low  . Arthropathy 02/23/2010    Priority: Low  . Alcoholism in remission ([place]) 08/27/2007    Priority: Low  . Insomnia 05/24/2020  . Murmur 05/24/2020  . Malignant neoplasm of upper-outer quadrant of left breast in female, estrogen receptor positive ([place]) 02/12/2020    Medications- reviewed and updated Current Outpatient Medications  Medication Sig Dispense Refill  . acetaminophen ([facility] ARTHRITIS PAIN RELIEF) 650 MG CR tablet Take 1,300 mg by mouth 2 (two) times daily.    Marland Kitchen albuterol (PROAIR HFA) 108 (90 Base) MCG/ACT inhaler INHALE 2 PUFFS EVERY 4 HOURS AS NEEDED FOR COUGHING SPELLS 18 g 5  . aspirin 325 MG tablet Take 1 tablet (325 mg total) by mouth daily. 30 tablet 3  . clopidogrel  (PLAVIX) 75 MG tablet Take 35.5 mg by mouth daily.    . Coenzyme Q10 (COQ10) 100 MG CAPS Take 300 capsules by mouth daily.     . ferrous sulfate 325 (65 FE) MG tablet TAKE 1 TABLET BY MOUTH EVERY DAY (Patient taking differently: Take 325 mg by mouth daily with breakfast. ) 90 tablet 3  . fexofenadine (ALLEGRA) 180 MG tablet Take 180 mg by mouth daily.    . fluticasone (FLONASE) 50 MCG/ACT nasal spray Place 2 sprays into both nostrils daily. (Patient taking differently: Place 2 sprays into both nostrils daily as needed. ) 16 g 2  . hydrochlorothiazide (HYDRODIURIL) 25 MG tablet TAKE ONE TABLET BY MOUTH DAILY 90 tablet 3  . KLOR-CON M20 20 MEQ tablet TAKE ONE TABLET BY MOUTH DAILY 90 tablet 3  . Multiple Vitamin (MULTIVITAMIN WITH MINERALS) TABS tablet Take 1 tablet by mouth daily.    . pantoprazole (PROTONIX) 20 MG tablet TAKE ONE TABLET BY MOUTH DAILY 90 tablet 3  . Propylene Glycol (SYSTANE BALANCE OP) Place 1 drop into both eyes every 6 (six) hours as needed (dry eyes).    . rosuvastatin (CRESTOR) 20 MG tablet Take 1 tablet (20 mg total) by mouth daily. 90 tablet 3  . Vitamin D, Cholecalciferol, 1000 units CAPS Take 1,000 Units by mouth daily.     . diclofenac Sodium (VOLTAREN) 1 % GEL Apply 2 g topically 4 (four) times daily. 100 g 11  . traZODone (DESYREL) 50 MG  tablet Take 0.5-1 tablets (25-50 mg total) by mouth at bedtime as needed for sleep. 30 tablet 3   No current facility-administered medications for this visit.     Objective:  BP 120/80   Pulse 79   Temp 97.7 F (36.5 C) (Temporal)   Resp (!) 188   Ht 5\' 5"  (1.651 m)   Wt 199 lb 9.6 oz (90.5 kg)   SpO2 91%   BMI 33.22 kg/m  Gen: NAD, resting comfortably CV: RRR when patient exhales note murmur at left upper sternal border msk on back of right upper leg- patient with 2 x 2 cm slightly firm mobile area subcutaneously (suspect cyst)     Assessment and Plan   # Possible Cyst S:Concerns for a possible cyst to the back of  her right thigh. Denies pain. She just wants it to be looked at.  A/P: seems to be consistent with cyst . Less likely lipoma. Did ask her to run this by dermatology next visit as well   # Sciatic Pain S:Patient mentioned that over the last couple of weeks she feels pain that shoots up her left leg. Comes and goes- changes position and improves. Lasts until position change A/P: this could be a nerve related pain- possibly piriformis- mild and intermittent right now- monitor for now as very mild  #Insomnia S: Patient reports intermittent issues for 25 years with sleep.  Some nights she is able to sleep 10 hours but other nights can only sleep 3 to 4 hours and finds herself up for hours trying to get to sleep.  She does try to avoid screen time at least 30 to 45 minutes before bed. A/P: Poor control of intermittent insomnia.  Patient would like to have an option to help her sleep when having a tougher night.  Trazodone would be reasonable option-prescription was written today-start with half pill of 50 mg to 25 mg at a time -With alcoholism history prefer to avoid benzos or medications like Ambien -could also refer to CBT-I potentially as I did recently discover a provider who does this form of CBT  Arthropathy S: Patient reports ongoing issues with arthritis.  Multiple fingers are bothered-has received a prescription for diclofenac gel in the past which was helpful from orthopedics.  She also has low back issues with pain-reports scoliosis, stenosis, arthritic changes as well.  She does take Tylenol arthritis twice daily this-sounds like 1300 mg at a time A/P: With multiple joint arthritis-patient request for arthritis medication for her hand refill on diclofenac gel-refills provided today.  I think Tylenol arthritis is reasonable.  Would like to avoid NSAIDs due to kidney risk and cardiac risk at her age.  Murmur S: Patient with no new symptoms.  Stable shortness of breath with COPD.  No chest pain  reported.  No dizziness/lightheadedness reported A/P: New onset murmur of undetermined significance-order echocardiogram with none on file   Recommended follow up: Return in about 6 months (around 11/21/2020) for follow up- or sooner if needed. Future Appointments  Date Time Provider [place]  05/24/2020  3:45 PM [facility] LIN[facility] 4 [facility] None  05/25/2020  2:30 PM [facility] LINAC 3 [facility] None  05/26/2020  1:40 PM Truitt Merle, MD [facility] None  05/26/2020  2:30 PM [facility] LINAC 3 [facility] None  08/26/2020  8:50 AM Pieter Partridge, DO [facility] None  11/23/2020 11:00 AM Yong Channel Brayton Mars, MD [facility]    Lab/Order associations:   ICD-10-CM   2. Insomnia, unspecified type  G47.00   3. Arthropathy  M12.9   4. Murmur  R01.1 ECHOCARDIOGRAM COMPLETE  5. Essential hypertension  I10 CBC With Differential/Platelet    COMPLETE METABOLIC PANEL WITH GFR    COMPLETE METABOLIC PANEL WITH GFR    CBC With Differential/Platelet   Meds ordered this encounter  Medications  . traZODone (DESYREL) 50 MG tablet    Sig: Take 0.5-1 tablets (25-50 mg total) by mouth at bedtime as needed for sleep.    Dispense:  30 tablet    Refill:  3  . diclofenac Sodium (VOLTAREN) 1 % GEL    Sig: Apply 2 g topically 4 (four) times daily.    Dispense:  100 g    Refill:  11  . albuterol (PROAIR HFA) 108 (90 Base) MCG/ACT inhaler    Sig: INHALE 2 PUFFS EVERY 4 HOURS AS NEEDED FOR COUGHING SPELLS    Dispense:  18 g    Refill:  5      Return precautions advised.  Garret Reddish, MD

## 2020-05-24 NOTE — Assessment & Plan Note (Signed)
S: Patient with no new symptoms.  Stable shortness of breath with COPD.  No chest pain reported.  No dizziness/lightheadedness reported A/P: New onset murmur of undetermined significance-order echocardiogram with none on file

## 2020-05-24 NOTE — Assessment & Plan Note (Signed)
S: Medication: rosuvastatin 20Mg  increased from simvastatin 40 (had issues at first but got better after some time- was having myalgias and arthalgias).  Did not tolerate atorvastatin Lab Results  Component Value Date   CHOL 166 11/19/2019   HDL 40.90 11/19/2019   LDLCALC 98 08/08/2018   LDLDIRECT 79.0 11/19/2019   TRIG 238.0 (H) 11/19/2019   CHOLHDL 4 11/19/2019   A/P: Hopefully improved control-direct LDL will be ordered today-continue current medication for now

## 2020-05-24 NOTE — Assessment & Plan Note (Signed)
S: Patient reports intermittent issues for 25 years with sleep.  Some nights she is able to sleep 10 hours but other nights can only sleep 3 to 4 hours and finds herself up for hours trying to get to sleep.  She does try to avoid screen time at least 30 to 45 minutes before bed. A/P: Poor control of intermittent insomnia.  Patient would like to have an option to help her sleep when having a tougher night.  Trazodone would be reasonable option-prescription was written today-start with half pill of 50 mg to 25 mg at a time -With alcoholism history prefer to avoid benzos or medications like Ambien -could also refer to CBT-I potentially as I did recently discover a provider who does this form of CBT

## 2020-05-24 NOTE — Assessment & Plan Note (Signed)
S: Patient reports ongoing issues with arthritis.  Multiple fingers are bothered-has received a prescription for diclofenac gel in the past which was helpful from orthopedics.  She also has low back issues with pain-reports scoliosis, stenosis, arthritic changes as well.  She does take Tylenol arthritis twice daily this-sounds like 1300 mg at a time A/P: With multiple joint arthritis-patient request for arthritis medication for her hand refill on diclofenac gel-refills provided today.  I think Tylenol arthritis is reasonable.  Would like to avoid NSAIDs due to kidney risk and cardiac risk at her age.

## 2020-05-25 ENCOUNTER — Other Ambulatory Visit: Payer: Self-pay

## 2020-05-25 ENCOUNTER — Ambulatory Visit
Admission: RE | Admit: 2020-05-25 | Discharge: 2020-05-25 | Disposition: A | Discharge status: Home / Self Care | Payer: Medicare HMO | Source: Ambulatory Visit | Attending: Radiation Oncology | Admitting: Radiation Oncology

## 2020-05-25 ENCOUNTER — Encounter: Payer: Self-pay | Admitting: *Deleted

## 2020-05-25 DIAGNOSIS — C50412 Malignant neoplasm of upper-outer quadrant of left female breast: Secondary | ICD-10-CM | POA: Diagnosis not present

## 2020-05-25 DIAGNOSIS — Z510 Encounter for antineoplastic radiation therapy: Secondary | ICD-10-CM | POA: Diagnosis not present

## 2020-05-25 LAB — CBC WITH DIFFERENTIAL/PLATELET
Absolute Monocytes: 946 cells/uL (ref 200–950)
Basophils Absolute: 58 cells/uL (ref 0–200)
Basophils Relative: 0.7 %
Eosinophils Absolute: 490 cells/uL (ref 15–500)
Eosinophils Relative: 5.9 %
HCT: 43.5 % (ref 35.0–45.0)
Hemoglobin: 14.8 g/dL (ref 11.7–15.5)
Lymphs Abs: 855 cells/uL (ref 850–3900)
MCH: 31.4 pg (ref 27.0–33.0)
MCHC: 34 g/dL (ref 32.0–36.0)
MCV: 92.2 fL (ref 80.0–100.0)
MPV: 11.3 fL (ref 7.5–12.5)
Monocytes Relative: 11.4 %
Neutro Abs: 5951 cells/uL (ref 1500–7800)
Neutrophils Relative %: 71.7 %
Platelets: 261 10*3/uL (ref 140–400)
RBC: 4.72 10*6/uL (ref 3.80–5.10)
RDW: 13 % (ref 11.0–15.0)
Total Lymphocyte: 10.3 %
WBC: 8.3 10*3/uL (ref 3.8–10.8)

## 2020-05-25 LAB — COMPLETE METABOLIC PANEL WITH GFR
AG Ratio: 1.9 (calc) (ref 1.0–2.5)
ALT: 13 U/L (ref 6–29)
AST: 18 U/L (ref 10–35)
Albumin: 4 g/dL (ref 3.6–5.1)
Alkaline phosphatase (APISO): 71 U/L (ref 37–153)
BUN: 17 mg/dL (ref 7–25)
CO2: 30 mmol/L (ref 20–32)
Calcium: 9.9 mg/dL (ref 8.6–10.4)
Chloride: 105 mmol/L (ref 98–110)
Creat: 0.76 mg/dL (ref 0.60–0.88)
GFR, Est African American: 84 mL/min/{1.73_m2} (ref >=60)
GFR, Est Non African American: 73 mL/min/{1.73_m2} (ref >=60)
Globulin: 2.1 g/dL (calc) (ref 1.9–3.7)
Glucose, Bld: 122 mg/dL — ABNORMAL HIGH (ref 65–99)
Potassium: 3.5 mmol/L (ref 3.5–5.3)
Sodium: 142 mmol/L (ref 135–146)
Total Bilirubin: 0.6 mg/dL (ref 0.2–1.2)
Total Protein: 6.1 g/dL (ref 6.1–8.1)

## 2020-05-25 LAB — IRON,TIBC AND FERRITIN PANEL
%SAT: 29 % (calc) (ref 16–45)
Ferritin: 82 ng/mL (ref 16–288)
Iron: 94 ug/dL (ref 45–160)
TIBC: 320 mcg/dL (calc) (ref 250–450)

## 2020-05-25 LAB — VITAMIN B12: Vitamin B-12: 340 pg/mL (ref 200–1100)

## 2020-05-25 LAB — LDL CHOLESTEROL, DIRECT: Direct LDL: 55 mg/dL (ref <100)

## 2020-05-26 ENCOUNTER — Inpatient Hospital Stay: Payer: Medicare HMO | Attending: Hematology | Admitting: Hematology

## 2020-05-26 ENCOUNTER — Encounter: Payer: Self-pay | Admitting: Radiation Oncology

## 2020-05-26 ENCOUNTER — Ambulatory Visit
Admission: RE | Admit: 2020-05-26 | Discharge: 2020-05-26 | Disposition: A | Discharge status: Home / Self Care | Payer: Medicare HMO | Source: Ambulatory Visit | Attending: Radiation Oncology | Admitting: Radiation Oncology

## 2020-05-26 VITALS — BP 109/88 | HR 82 | Temp 97.6°F | Resp 19 | Ht 65.0 in | Wt 198.6 lb

## 2020-05-26 DIAGNOSIS — Z8673 Personal history of transient ischemic attack (TIA), and cerebral infarction without residual deficits: Secondary | ICD-10-CM | POA: Insufficient documentation

## 2020-05-26 DIAGNOSIS — Z170 Estrogen receptor positive status [ER+]: Secondary | ICD-10-CM | POA: Insufficient documentation

## 2020-05-26 DIAGNOSIS — Z7902 Long term (current) use of antithrombotics/antiplatelets: Secondary | ICD-10-CM | POA: Insufficient documentation

## 2020-05-26 DIAGNOSIS — Z79810 Long term (current) use of selective estrogen receptor modulators (SERMs): Secondary | ICD-10-CM | POA: Insufficient documentation

## 2020-05-26 DIAGNOSIS — Z87891 Personal history of nicotine dependence: Secondary | ICD-10-CM | POA: Diagnosis not present

## 2020-05-26 DIAGNOSIS — E785 Hyperlipidemia, unspecified: Secondary | ICD-10-CM | POA: Insufficient documentation

## 2020-05-26 DIAGNOSIS — I10 Essential (primary) hypertension: Secondary | ICD-10-CM | POA: Insufficient documentation

## 2020-05-26 DIAGNOSIS — F1021 Alcohol dependence, in remission: Secondary | ICD-10-CM | POA: Diagnosis not present

## 2020-05-26 DIAGNOSIS — R5383 Other fatigue: Secondary | ICD-10-CM | POA: Diagnosis not present

## 2020-05-26 DIAGNOSIS — M19041 Primary osteoarthritis, right hand: Secondary | ICD-10-CM | POA: Diagnosis not present

## 2020-05-26 DIAGNOSIS — M419 Scoliosis, unspecified: Secondary | ICD-10-CM | POA: Insufficient documentation

## 2020-05-26 DIAGNOSIS — J300 Vasomotor rhinitis: Secondary | ICD-10-CM | POA: Diagnosis not present

## 2020-05-26 DIAGNOSIS — K219 Gastro-esophageal reflux disease without esophagitis: Secondary | ICD-10-CM | POA: Diagnosis not present

## 2020-05-26 DIAGNOSIS — Z8719 Personal history of other diseases of the digestive system: Secondary | ICD-10-CM | POA: Insufficient documentation

## 2020-05-26 DIAGNOSIS — Z7982 Long term (current) use of aspirin: Secondary | ICD-10-CM | POA: Insufficient documentation

## 2020-05-26 DIAGNOSIS — C50412 Malignant neoplasm of upper-outer quadrant of left female breast: Secondary | ICD-10-CM | POA: Diagnosis not present

## 2020-05-26 DIAGNOSIS — M19042 Primary osteoarthritis, left hand: Secondary | ICD-10-CM | POA: Insufficient documentation

## 2020-05-26 DIAGNOSIS — Z79899 Other long term (current) drug therapy: Secondary | ICD-10-CM | POA: Insufficient documentation

## 2020-05-26 DIAGNOSIS — J449 Chronic obstructive pulmonary disease, unspecified: Secondary | ICD-10-CM | POA: Diagnosis not present

## 2020-05-26 DIAGNOSIS — Z510 Encounter for antineoplastic radiation therapy: Secondary | ICD-10-CM | POA: Diagnosis not present

## 2020-05-26 DIAGNOSIS — Z923 Personal history of irradiation: Secondary | ICD-10-CM | POA: Diagnosis not present

## 2020-05-26 DIAGNOSIS — R69 Illness, unspecified: Secondary | ICD-10-CM | POA: Diagnosis not present

## 2020-05-26 DIAGNOSIS — J45991 Cough variant asthma: Secondary | ICD-10-CM | POA: Diagnosis not present

## 2020-05-26 MED ORDER — TAMOXIFEN CITRATE 20 MG PO TABS: 20.0000 mg | ORAL_TABLET | Freq: Every day | ORAL | 3 refills | Status: DC

## 2020-05-27 ENCOUNTER — Telehealth: Payer: Self-pay | Admitting: Hematology

## 2020-05-27 DIAGNOSIS — R69 Illness, unspecified: Secondary | ICD-10-CM | POA: Diagnosis not present

## 2020-05-27 NOTE — Telephone Encounter (Signed)
Scheduled per 11/17 los. Unable to reach pt. Left voicemail with appt times and dates.

## 2020-05-30 ENCOUNTER — Encounter: Payer: Self-pay | Admitting: Hematology

## 2020-06-02 DIAGNOSIS — R69 Illness, unspecified: Secondary | ICD-10-CM | POA: Diagnosis not present

## 2020-06-09 DIAGNOSIS — R69 Illness, unspecified: Secondary | ICD-10-CM | POA: Diagnosis not present

## 2020-06-12 NOTE — Progress Notes (Signed)
  Radiation Oncology         [phone] ________________________________  Name: Sheila Shields MRN: [id number]  Date: 05/26/2020  DOB: 11/28/1934  End of Treatment Note  Diagnosis:   left-sided breast cancer     Indication for treatment:  Curative       Radiation treatment dates:   04/29/20 - 05/26/20  Site/dose:   The patient initially received a dose of 42.56 Gy in 16 fractions to the breast using whole-breast tangent fields. This was delivered using a 3-D conformal technique. The patient then received a boost to the seroma. This delivered an additional 8 Gy in 33fractions using a 3 field photon technique due to the depth of the seroma. The total dose was 50.56 Gy.  Narrative: The patient tolerated radiation treatment relatively well.   The patient had some expected skin irritation as she progressed during treatment.    Plan: The patient has completed radiation treatment. The patient will return to radiation oncology clinic for routine followup in one month. I advised the patient to call or return sooner if they have any questions or concerns related to their recovery or treatment. ________________________________  Jodelle Gross, M.D., Ph.D.

## 2020-06-21 ENCOUNTER — Telehealth: Payer: Self-pay | Admitting: Radiation Oncology

## 2020-06-21 NOTE — Telephone Encounter (Signed)
  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  Interval Since Last Radiation:  5 weeks   04/29/20 - 05/26/20: The patient initially received a dose of 42.56 Gy in 16 fractions to the breast using whole-breast tangent fields. This was delivered using a 3-D conformal technique. The patient then received a boost to the seroma. This delivered an additional 8 Gy in 4 fractions using a 3 field photon technique due to the depth of the seroma. The total dose was 50.56 Gy.  Narrative:  The patient was contacted today for routine follow-up. During treatment she did very well with radiotherapy and did not have significant desquamation. She reports she has noticed almost resolution of her fatigue and skin changes. She is still feeling that her seroma is present.   Impression/Plan: 1. Stage IA, pT1bN0M0 grade 2 ER/PR positive invasive lobular carcinoma of the left breast. The patient has been doing well since completion of radiotherapy. We discussed that we would be happy to continue to follow her as needed, but she will also continue to follow up with Dr. Burr Medico in medical oncology. She was counseled on skin care as well as measures to avoid sun exposure to this area.  2. Survivorship. We discussed the importance of survivorship evaluation and encouraged her to attend her upcoming visit with that clinic. 3. Seroma. The patient is doing well and will follow up with Dr. Barry Dienes in a few weeks and feels that there is some mild re-accumulation     Carola Rhine, PAC

## 2020-06-29 ENCOUNTER — Ambulatory Visit (HOSPITAL_COMMUNITY): Payer: Medicare HMO | Attending: Cardiovascular Disease

## 2020-06-29 ENCOUNTER — Other Ambulatory Visit: Payer: Self-pay

## 2020-06-29 ENCOUNTER — Encounter: Payer: Self-pay | Admitting: Family Medicine

## 2020-06-29 DIAGNOSIS — R011 Cardiac murmur, unspecified: Secondary | ICD-10-CM

## 2020-06-29 DIAGNOSIS — I350 Nonrheumatic aortic (valve) stenosis: Secondary | ICD-10-CM | POA: Insufficient documentation

## 2020-06-29 LAB — ECHOCARDIOGRAM COMPLETE
AR max vel: 1.35 cm2
AV Area VTI: 1.46 cm2
AV Area mean vel: 1.33 cm2
AV Mean grad: 10 mmHg
AV Peak grad: 16.8 mmHg
Ao pk vel: 2.05 m/s
Area-P 1/2: 2.69 cm2
S' Lateral: 2.5 cm

## 2020-07-05 ENCOUNTER — Other Ambulatory Visit (HOSPITAL_COMMUNITY): Payer: Self-pay | Admitting: Interventional Radiology

## 2020-07-05 DIAGNOSIS — I671 Cerebral aneurysm, nonruptured: Secondary | ICD-10-CM

## 2020-07-08 ENCOUNTER — Encounter: Payer: Self-pay | Admitting: Family Medicine

## 2020-07-10 MED ORDER — TRAMADOL HCL 50 MG PO TABS: 50.0000 mg | ORAL_TABLET | Freq: Three times a day (TID) | ORAL | 0 refills | Status: DC | PRN

## 2020-07-12 ENCOUNTER — Other Ambulatory Visit: Payer: Self-pay

## 2020-07-12 DIAGNOSIS — M79605 Pain in left leg: Secondary | ICD-10-CM

## 2020-07-13 NOTE — Progress Notes (Signed)
Subjective:    I'm seeing this patient as a consultation for Dr. Yong Channel. Note will be routed back to referring provider/PCP.  CC: Left leg pain  I, Wendy Poet, LAT, ATC, am serving as scribe for Dr. Lynne Leader.  HPI: Pt is a 86yo female c/o L leg pain since early Nov 2021 w/ no known MOI.  Pt locates pain to her L leg at various locations including the L calf, L post knee and L post thigh.  She rates her pain at an 8/10 that she describes it as a combination of shooting, sharp, aching/throbbing  Low back pain: yes - hx of stenosis, HNP and arthritis Radiating pain: yes into her L LE to her L calf L LE numbness/tingling: No Aggravates: Nothing specific Alleviating: sitting in her recliner; laying down at night Rx tried: Tramadol;  Tylenol arthritis  Past medical history, Surgical history, Family history, Social history, Allergies, and medications have been entered into the medical record, reviewed.   Review of Systems: No new headache, visual changes, nausea, vomiting, diarrhea, constipation, dizziness, abdominal pain, skin rash, fevers, chills, night sweats, weight loss, swollen lymph nodes, body aches, joint swelling, muscle aches, chest pain, shortness of breath, mood changes, visual or auditory hallucinations.   Objective:    Vitals:   07/14/20 1317  BP: (!) 168/90   General: Well Developed, well nourished, and in no acute distress.  Neuro/Psych: Alert and oriented x3, extra-ocular muscles intact, able to move all 4 extremities, sensation grossly intact. Skin: Warm and dry, no rashes noted.  Respiratory: Not using accessory muscles, speaking in full sentences, trachea midline.  Cardiovascular: Pulses palpable, no extremity edema. Abdomen: Does not appear distended. MSK: Left knee moderate effusion. Mild genu valgus.  Otherwise normal. Normal motion with crepitation. Tender palpation medial and lateral joint line. Slight laxity to LCL stress testing without pain otherwise  ligamentous exam is normal. Intact strength. Negative McMurray test.  Lab and Radiology Results  X-ray images left knee obtained today personally and independently interpreted. Moderate tricompartmental DJD.  Osteophyte present at superior patellar ball Await formal radiology review  Procedure: Real-time Ultrasound Guided Injection of left knee superior lateral patellar space Device: Philips Affiniti 50G Images permanently stored and available for review in PACS Ultrasound examination prior to injection reveals moderate effusion.  Narrowed medial and lateral joint line.  Lateral joint line has a partially extruded lateral meniscus.  Posterior knee no Baker's cyst. Verbal informed consent obtained.  Discussed risks and benefits of procedure. Warned about infection bleeding damage to structures skin hypopigmentation and fat atrophy among others. Patient expresses understanding and agreement Time-out conducted.   Noted no overlying erythema, induration, or other signs of local infection.   Skin prepped in a sterile fashion.   Local anesthesia: Topical Ethyl chloride.   With sterile technique and under real time ultrasound guidance:  40 mg of Kenalog and 2 mL of Marcaine injected into left knee. Fluid seen entering the knee joint.   Completed without difficulty   Pain immediately resolved suggesting accurate placement of the medication.   Advised to call if fevers/chills, erythema, induration, drainage, or persistent bleeding.   Images permanently stored and available for review in the ultrasound unit.  Impression: Technically successful ultrasound guided injection.       Impression and Recommendations:    Assessment and Plan: 86 y.o. female with left knee pain due to DJD.  Plan for steroid injection.  Also recommend Tylenol Voltaren gel.  Discussed compression knee sleeve as well  which I do not think will be very helpful.  Offered physical therapy referral as needed as well.  Quad  strengthening may help.  Discussed in the future if steroid injection does not provide lasting relief could proceed with hyaluronic acid injections.  Recheck as needed.Marland Kitchen  PDMP not reviewed this encounter. Orders Placed This Encounter  Procedures  . [place] LIMITED JOINT SPACE STRUCTURES LOW LEFT(NO LINKED CHARGES)    Order Specific Question:   Reason for Exam (SYMPTOM  OR DIAGNOSIS REQUIRED)    Answer:   L leg pain    Order Specific Question:   Preferred imaging location?    Answer:   [facility] Knee AP/LAT W/Sunrise Left    Standing Status:   Future    Number of Occurrences:   1    Standing Expiration Date:   07/14/2021    Order Specific Question:   Reason for Exam (SYMPTOM  OR DIAGNOSIS REQUIRED)    Answer:   eval knee pain    Order Specific Question:   Preferred imaging location?    Answer:   [facility]   No orders of the defined types were placed in this encounter.   Discussed warning signs or symptoms. Please see discharge instructions. Patient expresses understanding.   The above documentation has been reviewed and is accurate and complete Lynne Leader, M.D.

## 2020-07-14 ENCOUNTER — Ambulatory Visit: Payer: Self-pay

## 2020-07-14 ENCOUNTER — Other Ambulatory Visit: Payer: Self-pay

## 2020-07-14 ENCOUNTER — Ambulatory Visit (INDEPENDENT_AMBULATORY_CARE_PROVIDER_SITE_OTHER): Payer: Medicare HMO

## 2020-07-14 ENCOUNTER — Encounter: Payer: Self-pay | Admitting: Family Medicine

## 2020-07-14 ENCOUNTER — Ambulatory Visit: Payer: Medicare HMO | Admitting: Family Medicine

## 2020-07-14 VITALS — BP 168/90 | Ht 65.0 in | Wt 197.4 lb

## 2020-07-14 DIAGNOSIS — M1712 Unilateral primary osteoarthritis, left knee: Secondary | ICD-10-CM | POA: Diagnosis not present

## 2020-07-14 DIAGNOSIS — Z170 Estrogen receptor positive status [ER+]: Secondary | ICD-10-CM | POA: Diagnosis not present

## 2020-07-14 DIAGNOSIS — C50412 Malignant neoplasm of upper-outer quadrant of left female breast: Secondary | ICD-10-CM | POA: Diagnosis not present

## 2020-07-14 DIAGNOSIS — M79605 Pain in left leg: Secondary | ICD-10-CM | POA: Diagnosis not present

## 2020-07-14 NOTE — Patient Instructions (Signed)
Thank you for coming in today.  Please get an Xray today before you leave  Call or go to the ER if you develop a large red swollen joint with extreme pain or oozing puss.   Please use voltaren gel up to 4x daily for pain as needed.   Let me know if not improving.

## 2020-07-14 NOTE — Progress Notes (Signed)
X-ray left knee shows mild arthritis.

## 2020-07-19 ENCOUNTER — Telehealth: Payer: Self-pay | Admitting: Nurse Practitioner

## 2020-07-19 NOTE — Telephone Encounter (Signed)
Rescheduled appointment per provider PAL request. Patient is aware of updated appointment date and time.

## 2020-07-27 ENCOUNTER — Other Ambulatory Visit: Payer: Self-pay

## 2020-07-27 ENCOUNTER — Ambulatory Visit (HOSPITAL_COMMUNITY)
Admission: RE | Admit: 2020-07-27 | Discharge: 2020-07-27 | Disposition: A | Discharge status: Home / Self Care | Payer: Medicare HMO | Source: Ambulatory Visit | Attending: Interventional Radiology | Admitting: Interventional Radiology

## 2020-07-27 ENCOUNTER — Ambulatory Visit (HOSPITAL_COMMUNITY): Payer: Medicare HMO

## 2020-07-27 DIAGNOSIS — I671 Cerebral aneurysm, nonruptured: Secondary | ICD-10-CM | POA: Insufficient documentation

## 2020-07-28 ENCOUNTER — Telehealth (HOSPITAL_COMMUNITY): Payer: Self-pay

## 2020-07-28 NOTE — Telephone Encounter (Signed)
Pt agreed to f/u in 6 months with [facility] head wo. AW

## 2020-07-30 ENCOUNTER — Ambulatory Visit: Payer: Medicare HMO

## 2020-08-05 ENCOUNTER — Ambulatory Visit (INDEPENDENT_AMBULATORY_CARE_PROVIDER_SITE_OTHER): Payer: Medicare HMO

## 2020-08-05 ENCOUNTER — Other Ambulatory Visit: Payer: Self-pay

## 2020-08-05 VITALS — BP 130/76 | HR 71 | Resp 20 | Wt 197.8 lb

## 2020-08-05 DIAGNOSIS — Z0000 Encounter for general adult medical examination without abnormal findings: Secondary | ICD-10-CM | POA: Diagnosis not present

## 2020-08-05 NOTE — Progress Notes (Signed)
Subjective:   Sheila Shields is a 86 y.o. female who presents for Medicare Annual (Subsequent) preventive examination.  Review of Systems     Cardiac Risk Factors include: advanced age (>61men, >68 women);dyslipidemia;hypertension;obesity (BMI >30kg/m2)     Objective:    Today's Vitals   08/05/20 1117 08/05/20 1118  BP: 130/76   Pulse: 71   Resp: 20   SpO2: 92%   Weight: 197 lb 12.8 oz (89.7 kg)   PainSc:  3    Body mass index is 32.92 kg/m.  Advanced Directives 08/05/2020 04/20/2020 04/20/2020 03/17/2020 02/23/2020 02/18/2020 08/25/2019  Does Patient Have a Medical Advance Directive? Yes - Yes Yes Yes Yes Yes  Type of Advance Directive Living will;Healthcare Power of Attorney Living will Living will [place];Living will [place];Out of facility DNR (pink MOST or yellow form);Living will - -  Does patient want to make changes to medical advance directive? - No - Patient declined Yes (MAU/Ambulatory/Procedural Areas - Information given) No - Patient declined - - -  Copy of [place] in Chart? Yes - validated most recent copy scanned in chart (See row information) - Yes - validated most recent copy scanned in chart (See row information) Yes - validated most recent copy scanned in chart (See row information) - - -  Would patient like information on creating a medical advance directive? - No - Patient declined Yes (Inpatient - patient defers creating a medical advance directive and declines information at this time);Yes (MAU/Ambulatory/Procedural Areas - Information given) - - - -  Pre-existing out of facility DNR order (yellow form or pink MOST form) - - - - - - -    Current Medications (verified) Outpatient Encounter Medications as of 08/05/2020  Medication Sig  . acetaminophen (TYLENOL) 650 MG CR tablet Take 1,300 mg by mouth 2 (two) times daily.  Marland Kitchen albuterol (PROAIR HFA) 108 (90 Base) MCG/ACT inhaler INHALE 2 PUFFS EVERY 4  HOURS AS NEEDED FOR COUGHING SPELLS  . aspirin 325 MG tablet Take 1 tablet (325 mg total) by mouth daily.  . clopidogrel (PLAVIX) 75 MG tablet Take 35.5 mg by mouth daily.  . Coenzyme Q10 (COQ10) 100 MG CAPS Take 300 capsules by mouth daily.   . diclofenac Sodium (VOLTAREN) 1 % GEL Apply 2 g topically 4 (four) times daily.  . ferrous sulfate 325 (65 FE) MG tablet TAKE 1 TABLET BY MOUTH EVERY DAY (Patient taking differently: Take 325 mg by mouth daily with breakfast.)  . fexofenadine (ALLEGRA) 180 MG tablet Take 180 mg by mouth daily.  . fluticasone (FLONASE) 50 MCG/ACT nasal spray Place 2 sprays into both nostrils daily. (Patient taking differently: Place 2 sprays into both nostrils daily as needed.)  . hydrochlorothiazide (HYDRODIURIL) 25 MG tablet TAKE ONE TABLET BY MOUTH DAILY  . KLOR-CON M20 20 MEQ tablet TAKE ONE TABLET BY MOUTH DAILY  . Multiple Vitamin (MULTIVITAMIN WITH MINERALS) TABS tablet Take 1 tablet by mouth daily.  . pantoprazole (PROTONIX) 20 MG tablet TAKE ONE TABLET BY MOUTH DAILY  . Propylene Glycol (SYSTANE BALANCE OP) Place 1 drop into both eyes every 6 (six) hours as needed (dry eyes).  . rosuvastatin (CRESTOR) 20 MG tablet Take 1 tablet (20 mg total) by mouth daily.  . tamoxifen (NOLVADEX) 20 MG tablet Take 1 tablet (20 mg total) by mouth daily.  . traZODone (DESYREL) 50 MG tablet Take 0.5-1 tablets (25-50 mg total) by mouth at bedtime as needed for sleep.  Marland Kitchen  Vitamin D, Cholecalciferol, 1000 units CAPS Take 1,000 Units by mouth daily.    No facility-administered encounter medications on file as of 08/05/2020.    Allergies (verified) Alcohol, Codeine, and Dilaudid [hydromorphone hcl]   History: Past Medical History:  Diagnosis Date  . Alcoholism in remission ([place]) 08/27/2007  . Anemia   . Arthritis    OA  . Breast cancer ([place])    left breast  . COPD 12/17/2006  . Diverticulosis 03/27/2007  . GERD 05/01/2007  . HYPERGLYCEMIA 08/27/2007  . HYPERLIPIDEMIA 12/17/2006   . HYPERTENSION 12/17/2006  . Mood disorder ([facility])    hx anxiety and depression. meds short term during life transition  . Pneumonia    AS A CHILD  . Rectal polyp 03/27/2007   adenoma  . SOB (shortness of breath) on exertion    per patient due to having COPD  . TIA (transient ischemic attack)    Past Surgical History:  Procedure Laterality Date  . ABDOMINAL HYSTERECTOMY  1978   fibroma  . APPENDECTOMY  2002  . BREAST EXCISIONAL BIOPSY Right 2003   negative  . BREAST LUMPECTOMY WITH RADIOACTIVE SEED LOCALIZATION Left 03/23/2020   Procedure: LEFT BREAST LUMPECTOMY WITH RADIOACTIVE SEED LOCALIZATION;  Surgeon: [name], MD;  Location: [facility];  Service: General;  Laterality: Left;  RNFA  . CARPAL TUNNEL RELEASE Left 2010 or 2011  . CATARACT EXTRACTION W/ INTRAOCULAR LENS  IMPLANT, BILATERAL Bilateral   . COLONOSCOPY W/ POLYPECTOMY  03/27/2007; n7/19/2012   2008: 4 mm adenoma, diverticulosis 2012: ileitis ? NSAID - likely, 2-3 mm cecal polyp LYMPHOID FOLLICLE, diverticulosis  . ESOPHAGOGASTRODUODENOSCOPY  01/26/2011   reflux esophagitis, colonscopy done also  . HAMMER TOE SURGERY  2008   left foot  . HERNIA REPAIR Right 1996?  . IR ANGIO INTRA EXTRACRAN SEL COM CAROTID INNOMINATE UNI R MOD SED  11/28/2018  . IR ANGIO INTRA EXTRACRAN SEL INTERNAL CAROTID UNI R MOD SED  12/30/2018  . IR ANGIO VERTEBRAL SEL SUBCLAVIAN INNOMINATE UNI R MOD SED  11/28/2018  . IR ANGIOGRAM FOLLOW UP STUDY  12/30/2018  . IR TRANSCATH/EMBOLIZ  12/30/2018  . IR US GUIDE VASC ACCESS RIGHT  11/28/2018  . ORIF ANKLE FRACTURE Right 12/22/2014   Procedure: OPEN REDUCTION INTERNAL FIXATION (ORIF) ANKLE FRACTURE;  Surgeon: Susa Day, MD;  Location: [facility];  Service: Orthopedics;  Laterality: Right;  . RADIOLOGY WITH ANESTHESIA N/A 12/30/2018   Procedure: RADIOLOGY WITH ANESTHESIA;  Surgeon: Luanne Bras, MD;  Location: [facility];  Service: Radiology;  Laterality: N/A;  . SHOULDER OPEN ROTATOR CUFF REPAIR Left 03/06/2013    Procedure: LEFT ROTATOR CUFF REPAIR, SUBACROMIAL DECOMPRESSION, PATCH GRAFT, MANIPULATION UNDER ANESTHESIA;  Surgeon: Johnn Hai, MD;  Location: [facility];  Service: Orthopedics;  Laterality: Left;  . TONSILLECTOMY     Family History  Problem Relation Age of Onset  . Throat cancer Mother        69  . Heart attack Father 32  . Idiopathic pulmonary fibrosis Brother 27  . Cancer Brother        sarcoma  . Heart disease Brother   . Colon cancer Neg Hx   . Esophageal cancer Neg Hx   . Stomach cancer Neg Hx    Social History   Socioeconomic History  . Marital status: Married    Spouse name: Not on file  . Number of children: 2  . Years of education: Not on file  . Highest education level: Not on file  Occupational History  .  Occupation: retired  Tobacco Use  . Smoking status: Former Smoker    Packs/day: 2.00    Years: 30.00    Pack years: 60.00    Types: Cigarettes    Quit date: 07/10/1984    Years since quitting: 36.0  . Smokeless tobacco: Never Used  Vaping Use  . Vaping Use: Never used  Substance and Sexual Activity  . Alcohol use: No    Alcohol/week: 0.0 standard drinks    Comment: no alcoho since 1983  . Drug use: No  . Sexual activity: Not on file  Other Topics Concern  . Not on file  Social History Narrative   Home Situation: lives with husband. 2 daughters, 2 step daughters, 6 grandkids. 2 greatgrandkids    Work or School: very active in [employer] and church      Spiritual Beliefs: Darrick Meigs      Lifestyle:tries to walk; diet ok - denies any alcohol or tobacco use in many many years      Caffeine 1 cup coffee/day      Hobbies: news junkie, audio books      Left Handed      Lives in one story home   Social Determinants of Health   Financial Resource Strain: Low Risk   . Difficulty of Paying Living Expenses: Not hard at all  Food Insecurity: No Food Insecurity  . Worried About Charity fundraiser in the Last Year: Never true  . Ran Out of Food in the  Last Year: Never true  Transportation Needs: No Transportation Needs  . Lack of Transportation (Medical): No  . Lack of Transportation (Non-Medical): No  Physical Activity: Inactive  . Days of Exercise per Week: 0 days  . Minutes of Exercise per Session: 0 min  Stress: Stress Concern Present  . Feeling of Stress : To some extent  Social Connections: Socially Integrated  . Frequency of Communication with Friends and Family: More than three times a week  . Frequency of Social Gatherings with Friends and Family: More than three times a week  . Attends Religious Services: More than 4 times per year  . Active Member of Clubs or Organizations: Yes  . Attends Archivist Meetings: 1 to 4 times per year  . Marital Status: Married    Tobacco Counseling Counseling given: Not Answered   Clinical Intake:  Pre-visit preparation completed: Yes  Pain : 0-10 Pain Score: 3  Pain Type: Chronic pain Pain Location: Generalized Pain Descriptors / Indicators: Aching     BMI - recorded: 32.92 Nutritional Status: BMI > 30  Obese Nutritional Risks: None Diabetes: No     Diabetic?No  Interpreter Needed?: No  Information entered by :: Charlott Rakes, LPN   Activities of Daily Living In your present state of health, do you have any difficulty performing the following activities: 08/05/2020 03/17/2020  Hearing? N N  Vision? N N  Difficulty concentrating or making decisions? N N  Walking or climbing stairs? N N  Dressing or bathing? N N  Doing errands, shopping? N N  Preparing Food and eating ? N -  Using the Toilet? N -  In the past six months, have you accidently leaked urine? N -  Do you have problems with loss of bowel control? N -  Managing your Medications? N -  Managing your Finances? N -  Housekeeping or managing your Housekeeping? N -  Some recent data might be hidden    Patient Care Team: Marin Olp, MD  as PCP - General (Family Medicine) Pieter Partridge, DO  as Consulting Physician (Neurology) Collene Gobble, MD as Consulting Physician (Pulmonary Disease) Vandling, P.A. as Consulting Physician Jarome Matin, MD as Consulting Physician (Dermatology) Luanne Bras, MD as Consulting Physician (Interventional Radiology) Mauro Kaufmann, RN as Oncology Nurse Navigator Rockwell Germany, RN as Oncology Nurse Navigator Truitt Merle, MD as Consulting Physician (Hematology) [name], MD as Consulting Physician (General Surgery) Kyung Rudd, MD as Consulting Physician (Radiation Oncology)  Indicate any recent Medical Services you may have received from other than [facility] providers in the past year (date may be approximate).     Assessment:   This is a routine wellness examination for Janani.  Hearing/Vision screen  Hearing Screening   125Hz  250Hz  500Hz  1000Hz  2000Hz  3000Hz  4000Hz  6000Hz  8000Hz   Right ear:           Left ear:           Comments: Pt denies any hearing issues   Vision Screening Comments: Pt follows Dr Katy Fitch for annual eye exams  Dietary issues and exercise activities discussed: Current Exercise Habits: The patient does not participate in regular exercise at present  Goals    . Patient Stated     Prepare for a new puppy !     . Patient Stated     Keep watch on weight     . Weight (lb) < 190 lb (86.2 kg)     Will watch sweet intake- come up w a plan  Will consider cutting portions out    Fat free or low fat dairy products Fish high in omega-3 acids ( salmon, tuna, trout) Fruits, such as apples, bananas, oranges, pears, prunes Legumes, such as kidney beans, lentils, checkpeas, black-eyed peas and lima beans Vegetables; broccoli, cabbage, carrots Whole grains;   Plant fats are better; decrease "white" foods as pasta, rice, bread and desserts, sugar; Avoid red meat (limiting) palm and coconut oils; sugary foods and beverages  Two nutrients that raise blood chol levels are saturated fats and trans fat; in  hydrogenated oils and fats, as stick margarine, baked goods (cookes, cakes, pies, crackers; frosting; and coffee creamers;   Some Fats lower cholesterol: Monounsaturated and polyunsaturated  Avocados Corn, sunflower, and soybean oils Nuts and seeds, such as walnuts Olive, canola, peanut, safflower, and sesame oils Peanut butter Salmon and trout Tofu         Depression Screen PHQ 2/9 Scores 08/05/2020 02/18/2020 08/11/2019 04/04/2019 02/26/2018 02/22/2017 01/20/2016  PHQ - 2 Score 0 0 0 0 0 0 0    Fall Risk Fall Risk  08/05/2020 02/23/2020 08/25/2019 05/22/2019 04/04/2019  Falls in the past year? 0 0 0 0 0  Number falls in past yr: 0 0 0 0 0  Injury with Fall? 0 0 0 0 0  Risk for fall due to : Impaired vision;Impaired balance/gait - - - -  Risk for fall due to: Comment slighty related to ankle pain - - - -  Follow up Falls prevention discussed - - - Education provided;Falls evaluation completed;Falls prevention discussed    FALL RISK PREVENTION PERTAINING TO THE HOME:  Any stairs in or around the home? Yes  If so, are there any without handrails? No  Home free of loose throw rugs in walkways, pet beds, electrical cords, etc? Yes  Adequate lighting in your home to reduce risk of falls? Yes   ASSISTIVE DEVICES UTILIZED TO PREVENT FALLS:  Life alert? No  Use of  a cane, walker or w/c? No  Grab bars in the bathroom? Yes  Shower chair or bench in shower? Yes  Elevated toilet seat or a handicapped toilet? Yes   TIMED UP AND GO:  Was the test performed? Yes .  Length of time to ambulate 10 feet:   sec.   Gait steady and fast without use of assistive device  Cognitive Function: MMSE - Mini Mental State Exam 02/22/2017  Not completed: (No Data)     6CIT Screen 08/05/2020 04/04/2019  What Year? 0 points 0 points  What month? 0 points 0 points  What time? - 0 points  Count back from 20 0 points 0 points  Months in reverse 0 points 0 points  Repeat phrase 0 points 0 points  Total Score  - 0    Immunizations Immunization History  Administered Date(s) Administered  . Fluad Quad(high Dose 65+) 04/04/2019  . Influenza Split 04/09/2012  . Influenza Whole 05/01/2007, 04/06/2008, 04/21/2009, 03/24/2010, 04/10/2011  . Influenza, High Dose Seasonal PF 04/09/2015, 03/17/2016, 05/22/2016, 04/16/2017, 05/21/2017, 03/20/2018, 05/20/2018, 05/26/2019, 04/10/2020, 05/26/2020  . Influenza,inj,Quad PF,6+ Mos 03/03/2013  . Influenza,inj,quad, With Preservative 03/20/2018  . Influenza-Unspecified 04/09/2014, 04/09/2015, 04/09/2017, 03/20/2018  . PFIZER(Purple Top)SARS-COV-2 Vaccination 07/27/2019, 08/14/2019, 04/30/2020, 05/26/2020  . Pneumococcal Conjugate-13 05/05/2015  . Pneumococcal Polysaccharide-23 04/09/2004, 05/21/2017, 05/20/2018, 05/26/2019, 05/26/2020  . Td 12/08/2005  . Tdap 12/22/2014  . Zoster 01/06/2008  . Zoster Recombinat (Shingrix) 08/01/2017, 10/03/2017    TDAP status: Up to date  Flu Vaccine status: Up to date Done 04/10/20  Pneumococcal vaccine status: Up to date  Covid-19 vaccine status: Completed vaccines  Qualifies for Shingles Vaccine? Yes   Zostavax completed Yes   Shingrix Completed?: Yes  Screening Tests Health Maintenance  Topic Date Due  . COVID-19 Vaccine (4 - Booster for Pfizer series) 11/23/2020  . TETANUS/TDAP  12/21/2024  . INFLUENZA VACCINE  Completed  . DEXA SCAN  Completed  . PNA vac Low Risk Adult  Completed    Health Maintenance  There are no preventive care reminders to display for this patient.  Colorectal cancer screening: No longer required.   Mammogram status: Completed 01/30/20. Repeat every year  Bone Density status: Completed 01/18/15. Results reflect: Bone density results: NORMAL. Repeat every 2 years.    Additional Screening:   Vision Screening: Recommended annual ophthalmology exams for early detection of glaucoma and other disorders of the eye. Is the patient up to date with their annual eye exam?  Yes  Who is  the provider or what is the name of the office in which the patient attends annual eye exams? Dr Katy Fitch    Dental Screening: Recommended annual dental exams for proper oral hygiene  Community Resource Referral / Chronic Care Management: CRR required this visit?  No   CCM required this visit?  No      Plan:     I have personally reviewed and noted the following in the patient's chart:   . Medical and social history . Use of alcohol, tobacco or illicit drugs  . Current medications and supplements . Functional ability and status . Nutritional status . Physical activity . Advanced directives . List of other physicians . Hospitalizations, surgeries, and ER visits in previous 12 months . Vitals . Screenings to include cognitive, depression, and falls . Referrals and appointments  In addition, I have reviewed and discussed with patient certain preventive protocols, quality metrics, and best practice recommendations. A written personalized care plan for preventive services as well as  general preventive health recommendations were provided to patient.     Willette Brace, LPN   [id number]   Nurse Notes: None

## 2020-08-05 NOTE — Patient Instructions (Addendum)
Sheila Shields , Thank you for taking time to come for your Medicare Wellness Visit. I appreciate your ongoing commitment to your health goals. Please review the following plan we discussed and let me know if I can assist you in the future.   Screening recommendations/referrals: Colonoscopy: No longer reuired  Mammogram: Done 01/30/20 Bone Density: Done 01/18/15 Recommended yearly ophthalmology/optometry visit for glaucoma screening and checkup Recommended yearly dental visit for hygiene and checkup  Vaccinations: Influenza vaccine: Done 04/10/20 Up to date Pneumococcal vaccine: Up to date Tdap vaccine: Up to date Shingles vaccine: Completed 1/23 & 10/03/17   Covid-19:Completed 1/17, 2/4, & 04/30/20  Advanced directives: Copies in chart  Conditions/risks identified: Keep watch on weight   Next appointment: Follow up in one year for your annual wellness visit    Preventive Care 20 Years and Older, Female Preventive care refers to lifestyle choices and visits with your health care provider that can promote health and wellness. What does preventive care include?  A yearly physical exam. This is also called an annual well check.  Dental exams once or twice a year.  Routine eye exams. Ask your health care provider how often you should have your eyes checked.  Personal lifestyle choices, including:  Daily care of your teeth and gums.  Regular physical activity.  Eating a healthy diet.  Avoiding tobacco and drug use.  Limiting alcohol use.  Practicing safe sex.  Taking low-dose aspirin every day.  Taking vitamin and mineral supplements as recommended by your health care provider. What happens during an annual well check? The services and screenings done by your health care provider during your annual well check will depend on your age, overall health, lifestyle risk factors, and family history of disease. Counseling  Your health care provider may ask you questions about  your:  Alcohol use.  Tobacco use.  Drug use.  Emotional well-being.  Home and relationship well-being.  Sexual activity.  Eating habits.  History of falls.  Memory and ability to understand (cognition).  Work and work Statistician.  Reproductive health. Screening  You may have the following tests or measurements:  Height, weight, and BMI.  Blood pressure.  Lipid and cholesterol levels. These may be checked every 5 years, or more frequently if you are over 50 years old.  Skin check.  Lung cancer screening. You may have this screening every year starting at age 2 if you have a 30-pack-year history of smoking and currently smoke or have quit within the past 15 years.  Fecal occult blood test (FOBT) of the stool. You may have this test every year starting at age 44.  Flexible sigmoidoscopy or colonoscopy. You may have a sigmoidoscopy every 5 years or a colonoscopy every 10 years starting at age 1.  Hepatitis C blood test.  Hepatitis B blood test.  Sexually transmitted disease (STD) testing.  Diabetes screening. This is done by checking your blood sugar (glucose) after you have not eaten for a while (fasting). You may have this done every 1-3 years.  Bone density scan. This is done to screen for osteoporosis. You may have this done starting at age 28.  Mammogram. This may be done every 1-2 years. Talk to your health care provider about how often you should have regular mammograms. Talk with your health care provider about your test results, treatment options, and if necessary, the need for more tests. Vaccines  Your health care provider may recommend certain vaccines, such as:  Influenza vaccine. This is recommended  every year.  Tetanus, diphtheria, and acellular pertussis (Tdap, Td) vaccine. You may need a Td booster every 10 years.  Zoster vaccine. You may need this after age 50.  Pneumococcal 13-valent conjugate (PCV13) vaccine. One dose is recommended  after age 17.  Pneumococcal polysaccharide (PPSV23) vaccine. One dose is recommended after age 67. Talk to your health care provider about which screenings and vaccines you need and how often you need them. This information is not intended to replace advice given to you by your health care provider. Make sure you discuss any questions you have with your health care provider. Document Released: 07/23/2015 Document Revised: 03/15/2016 Document Reviewed: 04/27/2015 Elsevier Interactive Patient Education  2017 [place] Prevention in the Home Falls can cause injuries. They can happen to people of all ages. There are many things you can do to make your home safe and to help prevent falls. What can I do on the outside of my home?  Regularly fix the edges of walkways and driveways and fix any cracks.  Remove anything that might make you trip as you walk through a door, such as a raised step or threshold.  Trim any bushes or trees on the path to your home.  Use bright outdoor lighting.  Clear any walking paths of anything that might make someone trip, such as rocks or tools.  Regularly check to see if handrails are loose or broken. Make sure that both sides of any steps have handrails.  Any raised decks and porches should have guardrails on the edges.  Have any leaves, snow, or ice cleared regularly.  Use sand or salt on walking paths during winter.  Clean up any spills in your garage right away. This includes oil or grease spills. What can I do in the bathroom?  Use night lights.  Install grab bars by the toilet and in the tub and shower. Do not use towel bars as grab bars.  Use non-skid mats or decals in the tub or shower.  If you need to sit down in the shower, use a plastic, non-slip stool.  Keep the floor dry. Clean up any water that spills on the floor as soon as it happens.  Remove soap buildup in the tub or shower regularly.  Attach bath mats securely with  double-sided non-slip rug tape.  Do not have throw rugs and other things on the floor that can make you trip. What can I do in the bedroom?  Use night lights.  Make sure that you have a light by your bed that is easy to reach.  Do not use any sheets or blankets that are too big for your bed. They should not hang down onto the floor.  Have a firm chair that has side arms. You can use this for support while you get dressed.  Do not have throw rugs and other things on the floor that can make you trip. What can I do in the kitchen?  Clean up any spills right away.  Avoid walking on wet floors.  Keep items that you use a lot in easy-to-reach places.  If you need to reach something above you, use a strong step stool that has a grab bar.  Keep electrical cords out of the way.  Do not use floor polish or wax that makes floors slippery. If you must use wax, use non-skid floor wax.  Do not have throw rugs and other things on the floor that can make you trip. What  can I do with my stairs?  Do not leave any items on the stairs.  Make sure that there are handrails on both sides of the stairs and use them. Fix handrails that are broken or loose. Make sure that handrails are as long as the stairways.  Check any carpeting to make sure that it is firmly attached to the stairs. Fix any carpet that is loose or worn.  Avoid having throw rugs at the top or bottom of the stairs. If you do have throw rugs, attach them to the floor with carpet tape.  Make sure that you have a light switch at the top of the stairs and the bottom of the stairs. If you do not have them, ask someone to add them for you. What else can I do to help prevent falls?  Wear shoes that:  Do not have high heels.  Have rubber bottoms.  Are comfortable and fit you well.  Are closed at the toe. Do not wear sandals.  If you use a stepladder:  Make sure that it is fully opened. Do not climb a closed stepladder.  Make  sure that both sides of the stepladder are locked into place.  Ask someone to hold it for you, if possible.  Clearly mark and make sure that you can see:  Any grab bars or handrails.  First and last steps.  Where the edge of each step is.  Use tools that help you move around (mobility aids) if they are needed. These include:  Canes.  Walkers.  Scooters.  Crutches.  Turn on the lights when you go into a dark area. Replace any light bulbs as soon as they burn out.  Set up your furniture so you have a clear path. Avoid moving your furniture around.  If any of your floors are uneven, fix them.  If there are any pets around you, be aware of where they are.  Review your medicines with your doctor. Some medicines can make you feel dizzy. This can increase your chance of falling. Ask your doctor what other things that you can do to help prevent falls. This information is not intended to replace advice given to you by your health care provider. Make sure you discuss any questions you have with your health care provider. Document Released: 04/22/2009 Document Revised: 12/02/2015 Document Reviewed: 07/31/2014 Elsevier Interactive Patient Education  2017 [employer].

## 2020-08-09 DIAGNOSIS — M65841 Other synovitis and tenosynovitis, right hand: Secondary | ICD-10-CM | POA: Diagnosis not present

## 2020-08-09 DIAGNOSIS — M13841 Other specified arthritis, right hand: Secondary | ICD-10-CM | POA: Diagnosis not present

## 2020-08-09 DIAGNOSIS — M659 Synovitis and tenosynovitis, unspecified: Secondary | ICD-10-CM | POA: Insufficient documentation

## 2020-08-25 ENCOUNTER — Encounter: Payer: Medicare HMO | Admitting: Nurse Practitioner

## 2020-08-25 NOTE — Progress Notes (Signed)
NEUROLOGY FOLLOW UP OFFICE NOTE  Sheila Shields [id number]   Subjective:  Sheila Shields is an 86 year old womanwith breast cancer, COPD, hypertension, hyperlipidemia, arthritis, alcoholism in remission and ocular migraineswho follows up for TIA, cerebral aneurysm and transient global amnesia.    UPDATE: Current medications: ASA 325mg ; Plavix 37.5mg  daily (reduced from 75mg  due to bruising); simvastatin40mg ; Zetia 10mg   Repeat MRI and MRA of brain on 07/27/2020 personally reviewed showed stable right supraclinoid ICA stenting extending into right M1 MCA with no evidence of flow related signal in treated aneurysm.  She reports some balance problems from time to time.  No falls.  She reports numbness in both big toes.  She did fracture her right ankle.  No pain or bothersome but "aware" of it, especially at night.  No TIA events.  HISTORY: She has longstanding history of ocular migraines presenting as wavy lines in her vision. She had increased frequency of ocular migraines for a few days in March 2020. She was evaluated by ophthalmology with unremarkable exam. , she developed a dull nonthrobbing headache across the forehead and down the left side of her face, ear and neck. A few days later, she was relaxing in her recliner when she developed left sided facial numbness lasting less than 5 minutes. 45 minutes later, her left hand became numb and contracted into a fist. She couldn't open her fist. This lasted less than 5 minutes. There was no associated visual disturbance, facial droop, slurred speech or unilateral pain or weakness of the arm and hand. CT of head without contrast performed on 10/21/2018 demonstrated mild chronic small vessel ischemic changes and atrophy but no acute intracranial abnormality.Later that evening, her left arm and hand became numb for a couple of minutes. No recurrent episodes today but continues to have mild headache and left sided neck pain.  She  underwent a TIA workup: MRI of brain from 11/05/18 personally reviewed and demonstrated age-related atrophy and mild chronic small vessel ischemic changes but no acute or subacute infarct. 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 Direct LDL from 01/21/19 was 94. She was advised to increase atorvastatin from 20mg  to 40mg  daily, however it caused myalgias. She was changed back to to simvastatin  She saw Dr. Estanislado Pandy of IR for the Right ICA intracranial aneurysm and underwent endovascular embolization in June 2020.Since the procedure, she has had a dull persistent headache. She really only notices it when she is resting. She doesn't pay attention to it if she is active and preoccupied.  On 02/26/19, she and her husband were having lunch with friends when she suddenly realized she was referring to her current husband by her first husband's name. It was a strange feeling of being taken back in time. No headache, loss of awareness, phantosmia, gastric uprising, slurred speech, facial droop, dizziness or unilateral numbness or weakness. This lasted up to 5 minutes at the most. She reports a similar event 20 years ago at her doctor's office who diagnosed her with transient global amnesia. However, that episode lasted about 45 minutes.  Due to recurrence of transient global amnesia, she had an EEG on 04/07/2019 which was normal awake and asleep. She underwent a repeat MRI of brain  on 05/27/2019 which was personally reviewed and was stable with no acute intracranial abnormality. MRA of head showed interval treatment of supraclinoid right ICA aneurysm but otherwise unremarkable. Since  her procedure, Dr. Estanislado Pandy has stated that she needs to be on ASA 325mg  daily in addition to Plavix. She reports bruising and wants to know if the ASA can at least be reduced to 81mg . She has not had any recurrent TGA. Repeat MRA of head on 12/07/2019 showed stent of right supraclinoid internal carotid artery extending to the right MCA with no flow-related signal of aneurysm.  PAST MEDICAL HISTORY: Past Medical History:  Diagnosis Date  . Alcoholism in remission ([place]) 08/27/2007  . Anemia   . Arthritis    OA  . Breast cancer ([place])    left breast  . COPD 12/17/2006  . Diverticulosis 03/27/2007  . GERD 05/01/2007  . HYPERGLYCEMIA 08/27/2007  . HYPERLIPIDEMIA 12/17/2006  . HYPERTENSION 12/17/2006  . Mood disorder ([facility])    hx anxiety and depression. meds short term during life transition  . Pneumonia    AS A CHILD  . Rectal polyp 03/27/2007   adenoma  . SOB (shortness of breath) on exertion    per patient due to having COPD  . TIA (transient ischemic attack)     MEDICATIONS: Current Outpatient Medications on File Prior to Visit  Medication Sig Dispense Refill  . acetaminophen (TYLENOL) 650 MG CR tablet Take 1,300 mg by mouth 2 (two) times daily.    Marland Kitchen albuterol (PROAIR HFA) 108 (90 Base) MCG/ACT inhaler INHALE 2 PUFFS EVERY 4 HOURS AS NEEDED FOR COUGHING SPELLS 18 g 5  . aspirin 325 MG tablet Take 1 tablet (325 mg total) by mouth daily. 30 tablet 3  . clopidogrel (PLAVIX) 75 MG tablet Take 35.5 mg by mouth daily.    . Coenzyme Q10 (COQ10) 100 MG CAPS Take 300 capsules by mouth daily.     . diclofenac Sodium (VOLTAREN) 1 % GEL Apply 2 g topically 4 (four) times daily. 100 g 11  . ferrous sulfate 325 (65 FE) MG tablet TAKE 1 TABLET BY MOUTH EVERY DAY (Patient taking differently: Take 325 mg by mouth daily with breakfast.) 90 tablet 3  . fexofenadine (ALLEGRA) 180 MG tablet Take 180 mg by mouth daily.    . fluticasone (FLONASE) 50 MCG/ACT nasal spray Place 2 sprays into both nostrils daily.  (Patient taking differently: Place 2 sprays into both nostrils daily as needed.) 16 g 2  . hydrochlorothiazide (HYDRODIURIL) 25 MG tablet TAKE ONE TABLET BY MOUTH DAILY 90 tablet 3  . KLOR-CON M20 20 MEQ tablet TAKE ONE TABLET BY MOUTH DAILY 90 tablet 3  . Multiple Vitamin (MULTIVITAMIN WITH MINERALS) TABS tablet Take 1 tablet by mouth daily.    . pantoprazole (PROTONIX) 20 MG tablet TAKE ONE TABLET BY MOUTH DAILY 90 tablet 3  . Propylene Glycol (SYSTANE BALANCE OP) Place 1 drop into both eyes every 6 (six) hours as needed (dry eyes).    . rosuvastatin (CRESTOR) 20 MG tablet Take 1 tablet (20 mg total) by mouth daily. 90 tablet 3  . tamoxifen (NOLVADEX) 20 MG tablet Take 1 tablet (20 mg total) by mouth daily. 30 tablet 3  . traZODone (DESYREL) 50 MG tablet Take 0.5-1 tablets (25-50 mg total) by mouth at bedtime as needed for sleep. 30 tablet 3  . Vitamin D, Cholecalciferol, 1000 units CAPS Take 1,000 Units by mouth daily.      No current facility-administered medications on file prior  to visit.    ALLERGIES: Allergies  Allergen Reactions  . Alcohol Other (See Comments)    Recovering Alcoholic*  . Codeine     Patient prefers not to take this due to history of alcoholism  . Dilaudid [Hydromorphone Hcl]     Pt had hypotension after dilaudid 1mg  IV while in the OR, required temporary pressor intervention.    FAMILY HISTORY: Family History  Problem Relation Age of Onset  . Throat cancer Mother        4  . Heart attack Father 37  . Idiopathic pulmonary fibrosis Brother 59  . Cancer Brother        sarcoma  . Heart disease Brother   . Colon cancer Neg Hx   . Esophageal cancer Neg Hx   . Stomach cancer Neg Hx     SOCIAL HISTORY: Social History   Socioeconomic History  . Marital status: Married    Spouse name: Not on file  . Number of children: 2  . Years of education: Not on file  . Highest education level: Not on file  Occupational History  . Occupation: retired  Tobacco  Use  . Smoking status: Former Smoker    Packs/day: 2.00    Years: 30.00    Pack years: 60.00    Types: Cigarettes    Quit date: 07/10/1984    Years since quitting: 36.1  . Smokeless tobacco: Never Used  Vaping Use  . Vaping Use: Never used  Substance and Sexual Activity  . Alcohol use: No    Alcohol/week: 0.0 standard drinks    Comment: no alcoho since 1983  . Drug use: No  . Sexual activity: Not on file  Other Topics Concern  . Not on file  Social History Narrative   Home Situation: lives with husband. 2 daughters, 2 step daughters, 6 grandkids. 2 greatgrandkids    Work or School: very active in [employer] and church      Spiritual Beliefs: Darrick Meigs      Lifestyle:tries to walk; diet ok - denies any alcohol or tobacco use in many many years      Caffeine 1 cup coffee/day      Hobbies: news junkie, audio books      Left Handed      Lives in one story home   Social Determinants of Health   Financial Resource Strain: Low Risk   . Difficulty of Paying Living Expenses: Not hard at all  Food Insecurity: No Food Insecurity  . Worried About Charity fundraiser in the Last Year: Never true  . Ran Out of Food in the Last Year: Never true  Transportation Needs: No Transportation Needs  . Lack of Transportation (Medical): No  . Lack of Transportation (Non-Medical): No  Physical Activity: Inactive  . Days of Exercise per Week: 0 days  . Minutes of Exercise per Session: 0 min  Stress: Stress Concern Present  . Feeling of Stress : To some extent  Social Connections: Socially Integrated  . Frequency of Communication with Friends and Family: More than three times a week  . Frequency of Social Gatherings with Friends and Family: More than three times a week  . Attends Religious Services: More than 4 times per year  . Active Member of Clubs or Organizations: Yes  . Attends Archivist Meetings: 1 to 4 times per year  . Marital Status: Married  Human resources officer Violence: Not At  Risk  . Fear of Current or Ex-Partner: No  .  Emotionally Abused: No  . Physically Abused: No  . Sexually Abused: No     Objective:  Blood pressure (!) 150/70, pulse 86, weight 191 lb 12.8 oz (87 kg), SpO2 90 %. General: No acute distress.  Patient appears well-groomed.   Head:  Normocephalic/atraumatic Eyes:  Fundi examined but not visualized Neck: supple, no paraspinal tenderness, full range of motion Heart:  Regular rate and rhythm Lungs:  Clear to auscultation bilaterally Back: No paraspinal tenderness Neurological Exam: alert and oriented to person, place, and time. Attention span and concentration intact, recent and remote memory intact, fund of knowledge intact.  Speech fluent and not dysarthric, language intact.  CN II-XII intact. Bulk and tone normal, muscle strength 5/5 throughout.  Sensation to pinprick slightly reduced on right first toe and dorsum of right foot, slightly reduced vibratory sensation in feet bilaterally  Deep tendon reflexes 1+ throughout, toes downgoing.  Finger to nose and heel to shin testing intact.  Gait normal, able to tandem walk. Romberg negative.   Assessment/Plan:   1.  Possible CVA/TIA vs migraine 2.  Recurrent transient global amnesia - workup including EEG for evaluation of seizures, negative 3.  Supraclinoid right ICA aneurysm s/p embolization 4.  Ocular migraine  1.  Continue secondary stroke prevention as per PCP 2.  Follow up blood pressure with PCP 3.  Follow up as needed.  Sheila Clines, DO  CC: Brayton Mars. Yong Channel, MD

## 2020-08-26 ENCOUNTER — Ambulatory Visit: Payer: Medicare HMO | Admitting: Neurology

## 2020-08-26 ENCOUNTER — Encounter: Payer: Self-pay | Admitting: Neurology

## 2020-08-26 ENCOUNTER — Other Ambulatory Visit: Payer: Self-pay

## 2020-08-26 VITALS — BP 150/70 | HR 86 | Wt 191.8 lb

## 2020-08-26 DIAGNOSIS — G459 Transient cerebral ischemic attack, unspecified: Secondary | ICD-10-CM

## 2020-08-26 DIAGNOSIS — I10 Essential (primary) hypertension: Secondary | ICD-10-CM

## 2020-08-26 DIAGNOSIS — G454 Transient global amnesia: Secondary | ICD-10-CM | POA: Diagnosis not present

## 2020-08-26 DIAGNOSIS — G43109 Migraine with aura, not intractable, without status migrainosus: Secondary | ICD-10-CM | POA: Diagnosis not present

## 2020-08-26 DIAGNOSIS — I671 Cerebral aneurysm, nonruptured: Secondary | ICD-10-CM | POA: Diagnosis not present

## 2020-08-27 ENCOUNTER — Telehealth: Payer: Self-pay | Admitting: Nurse Practitioner

## 2020-08-27 NOTE — Telephone Encounter (Signed)
Changed upcoming appointment to virtual per provider's request. Patient is aware of changes.

## 2020-08-29 NOTE — Progress Notes (Signed)
CLINIC: Survivorship   Patient Care Team: Marin Olp, MD as PCP - General (Family Medicine) Pieter Partridge, DO as Consulting Physician (Neurology) Collene Gobble, MD as Consulting Physician (Pulmonary Disease) Spencer, P.A. as Consulting Physician Jarome Matin, MD as Consulting Physician (Dermatology) Luanne Bras, MD as Consulting Physician (Interventional Radiology) Mauro Kaufmann, RN as Oncology Nurse Navigator Rockwell Germany, RN as Oncology Nurse Navigator Truitt Merle, MD as Consulting Physician (Hematology) [name], MD as Consulting Physician (General Surgery) Kyung Rudd, MD as Consulting Physician (Radiation Oncology) Alla Feeling, NP as Nurse Practitioner (Nurse Practitioner)  I connected with Sheila Shields on 08/30/20 at 11:30 AM EST by telephone and verified that I am speaking with the correct person using two identifiers.  I discussed the limitations, risks, security and privacy concerns of performing an evaluation and management service by telephone and the availability of in person appointments. I also discussed with the patient that there may be a patient responsible charge related to this service. The patient expressed understanding and agreed to proceed.   No other persons attending today's call  Patient's location: Home Provider's location: Home office   BRIEF ONCOLOGIC HISTORY:  Oncology History Overview Note  Cancer Staging Malignant neoplasm of upper-outer quadrant of left breast in female, estrogen receptor positive ([place]) Staging form: Breast, AJCC 8th Edition - Clinical stage from 02/09/2020: Stage IA (cT1b, cN0, cM0, G2, ER+, PR+, HER2-) - Signed by Truitt Merle, MD on 02/17/2020    Malignant neoplasm of upper-outer quadrant of left breast in female, estrogen receptor positive ([place])  01/30/2020 Mammogram   IMPRESSION: 0.6x0.4x0.6cm suspicious mass 12 o'clock axis left breast, 3 cm from the nipple.    02/09/2020 Receptors her2    PROGNOSTIC INDICATORS Results: IMMUNOHISTOCHEMICAL AND MORPHOMETRIC ANALYSIS PERFORMED MANUALLY The tumor cells are NEGATIVE for Her2 (1+). Estrogen Receptor: 95%, POSITIVE, STRONG STAINING INTENSITY Progesterone Receptor: 90%, POSITIVE, MODERATE STAINING INTENSITY Proliferation Marker Ki67: 15%   02/09/2020 Cancer Staging   Staging form: Breast, AJCC 8th Edition - Clinical stage from 02/09/2020: Stage IA (cT1b, cN0, cM0, G2, ER+, PR+, HER2-) - Signed by Truitt Merle, MD on 02/17/2020   02/12/2020 Initial Diagnosis   Malignant neoplasm of upper-outer quadrant of left breast in female, estrogen receptor positive ([place])   02/16/2020 Initial Biopsy   Diagnosis Breast, left, needle core biopsy, 12:30, 3 cmfn - INVASIVE MAMMARY CARCINOMA, SEE COMMENT. - MAMMARY CARCINOMA IN SITU. Microscopic Comment The carcinoma appears grade 2 and measures 11 mm in greatest linear extent. E-cadherin will be ordered. Prognostic makers will be ordered. Dr. Saralyn Pilar has reviewed the case. The case was called to [facility] on 02/10/2020.   03/23/2020 Surgery   LEFT BREAST LUMPECTOMY WITH RADIOACTIVE SEED LOCALIZATION by Dr Barry Dienes     03/23/2020 Pathology Results   FINAL MICROSCOPIC DIAGNOSIS:   A. BREAST, LEFT, LUMPECTOMY:  - Invasive lobular carcinoma, 1.1 cm, Nottingham grade 2 of 3.  - Invasive carcinoma focally involves the medial margin.  - Biopsy clip site.  - See oncology table.   B. BREAST, LEFT, ADDITIONAL MEDIAL MARGIN, EXCISION:  - Breast tissue; no invasive carcinoma identified.   C. BREAST, LEFT, ADDITIONAL SUPERIOR MARGIN, EXCISION:  - Breast tissue; no invasive carcinoma identified.   D. BREAST, LEFT, ADDITIONAL POSTERIOR MARGIN, EXCISION:  - Breast tissue; no invasive carcinoma identified.    03/23/2020 Cancer Staging   Staging form: Breast, AJCC 8th Edition - Pathologic stage from 03/23/2020: Stage Unknown (pT1c, pNX, cM0,  G2, ER+, PR+, HER2-) - Signed by Truitt Merle, MD  on 05/25/2020   04/29/2020 - 05/26/2020 Radiation Therapy   Adjuvant Radiation with [name] 04/29/20-05/26/20    06/2020 -  Anti-estrogen oral therapy   Tamoxifen 50m once daily starting 06/2020   08/30/2020 Survivorship   SCP done virtually by [name] NP. Care plan sent to patient via Mychart      INTERVAL HISTORY:  Sheila Shields review her survivorship care plan detailing her treatment course for breast cancer, as well as monitoring long-term side effects of that treatment, education regarding health maintenance, screening, and overall wellness and health promotion.     Overall, Sheila Shields doing remarkably well. She has no complaints. She is on Tamoxifen, without any "reactions." Skin has recovered well without any new lump/mass or concern. She has had a seroma drained by Dr. BBarry Dienesbut is not bothering her currently.     ONCOLOGY TREATMENT TEAM:  1. Sur[facility]  [facility] 2. Medical Oncologist: [name] 3. Radiation Oncologist: Dr. MLisbeth Renshaw   PAST MEDICAL/SURGICAL HISTORY:  Past Medical History:  Diagnosis Date  . [place]ism in remission ([facility] 08/27/2007  . Anemia   . Arthritis    OA  . [place]    left breast  . COPD 12/17/2006  . Diverticulosis 03/27/2007  . GERD 05/01/2007  . HYPERGLYCEMIA 08/27/2007  . HYPERLIPIDEMIA 12/17/2006  . HYPERTENSION 12/17/2006  . Mood disorder ([facility])    hx anxiety and depression. meds short term during life transition  . Pneumonia    AS A CHILD  . Rectal polyp 03/27/2007   adenoma  . SOB (shortness of breath) on exertion    per patient due to having COPD  . TIA (transient ischemic attack)    Past Surgical History:  Procedure Laterality Date  . ABDOMINAL HYSTERECTOMY  1978   fibroma  . APPENDECTOMY  2002  . BREAST EXCISIONAL BIOPSY Right 2003   negative  . BREAST LUMPECTOMY WITH RADIOACTIVE SEED LOCALIZATION Left 03/23/2020   Procedure: LEFT BREAST LUMPECTOMY WITH RADIOACTIVE SEED  LOCALIZATION;  Surgeon: BStark Klein MD;  [facility]  Service: General;  Laterality: Left;  RNFA  . CARPAL TUNNEL RELEASE Left 2010 or 2011  . CATARACT EXTRACTION W/ INTRAOCULAR LENS  IMPLANT, BILATERAL Bilateral   . COLONOSCOPY W/ POLYPECTOMY  03/27/2007; n7/19/2012   2008: 4 mm adenoma, diverticulosis 2012: ileitis ? NSAID - likely, 2-3 mm cecal polyp LYMPHOID FOLLICLE, diverticulosis  . ESOPHAGOGASTRODUODENOSCOPY  01/26/2011   reflux esophagitis, colonscopy done also  . HAMMER TOE SURGERY  2008   left foot  . HERNIA REPAIR Right 1996?  . IR ANGIO INTRA EXTRACRAN SEL COM CAROTID INNOMINATE UNI R MOD SED  11/28/2018  . IR ANGIO INTRA EXTRACRAN SEL INTERNAL CAROTID UNI R MOD SED  12/30/2018  . IR ANGIO VERTEBRAL SEL SUBCLAVIAN INNOMINATE UNI R MOD SED  11/28/2018  . IR ANGIOGRAM FOLLOW UP STUDY  12/30/2018  . IR TRANSCATH/EMBOLIZ  12/30/2018  . IR [place] ACCESS RIGHT  11/28/2018  . ORIF ANKLE FRACTURE Right 12/22/2014   Procedure: OPEN REDUCTION INTERNAL FIXATION (ORIF) ANKLE FRACTURE;  Surgeon: JSusa Day MD;  Location: [facility];  Service: Orthopedics;  Laterality: Right;  . RADIOLOGY WITH ANESTHESIA N/A 12/30/2018   Procedure: RADIOLOGY WITH ANESTHESIA;  Surgeon: DLuanne Bras MD;  [facility] [facility]  Service: Radiology;  Laterality: N/A;  . SHOULDER OPEN ROTATOR CUFF REPAIR Left 03/06/2013   Procedure: LEFT  ROTATOR CUFF REPAIR, SUBACROMIAL DECOMPRESSION, PATCH GRAFT, MANIPULATION UNDER ANESTHESIA;  Surgeon: Johnn Hai, MD;  Location: [facility];  Service: Orthopedics;  Laterality: Left;  . TONSILLECTOMY       ALLERGIES:  Allergies  Allergen Reactions  . Alcohol Other (See Comments)    Recovering Alcoholic*  . Codeine     Patient prefers not to take this due to history of alcoholism  . Dilaudid [Hydromorphone Hcl]     Pt had hypotension after dilaudid 59m IV while in the OR, required temporary pressor intervention.     CURRENT MEDICATIONS:  Outpatient Encounter  Medications as of 08/30/2020  Medication Sig  . acetaminophen (TYLENOL) 650 MG CR tablet Take 1,300 mg by mouth 2 (two) times daily.  .Marland Kitchenalbuterol (PROAIR HFA) 108 (90 Base) MCG/ACT inhaler INHALE 2 PUFFS EVERY 4 HOURS AS NEEDED FOR COUGHING SPELLS  . aspirin 325 MG tablet Take 1 tablet (325 mg total) by mouth daily.  . clopidogrel (PLAVIX) 75 MG tablet Take 35.5 mg by mouth daily.  . Coenzyme Q10 (COQ10) 100 MG CAPS Take 300 capsules by mouth daily.   . diclofenac Sodium (VOLTAREN) 1 % GEL Apply 2 g topically 4 (four) times daily.  . ferrous sulfate 325 (65 FE) MG tablet TAKE 1 TABLET BY MOUTH EVERY DAY (Patient taking differently: Take 325 mg by mouth daily with breakfast.)  . fexofenadine (ALLEGRA) 180 MG tablet Take 180 mg by mouth daily.  . fluticasone (FLONASE) 50 MCG/ACT nasal spray Place 2 sprays into both nostrils daily. (Patient taking differently: Place 2 sprays into both nostrils daily as needed.)  . hydrochlorothiazide (HYDRODIURIL) 25 MG tablet TAKE ONE TABLET BY MOUTH DAILY  . KLOR-CON M20 20 MEQ tablet TAKE ONE TABLET BY MOUTH DAILY  . Multiple Vitamin (MULTIVITAMIN WITH MINERALS) TABS tablet Take 1 tablet by mouth daily.  . pantoprazole (PROTONIX) 20 MG tablet TAKE ONE TABLET BY MOUTH DAILY  . Propylene Glycol (SYSTANE BALANCE OP) Place 1 drop into both eyes every 6 (six) hours as needed (dry eyes).  . rosuvastatin (CRESTOR) 20 MG tablet Take 1 tablet (20 mg total) by mouth daily.  . tamoxifen (NOLVADEX) 20 MG tablet Take 1 tablet (20 mg total) by mouth daily.  . traZODone (DESYREL) 50 MG tablet Take 0.5-1 tablets (25-50 mg total) by mouth at bedtime as needed for sleep.  . Vitamin D, Cholecalciferol, 1000 units CAPS Take 1,000 Units by mouth daily.    No facility-administered encounter medications on file as of 08/30/2020.     ONCOLOGIC FAMILY HISTORY:  Family History  Problem Relation Age of Onset  . Throat cancer Mother        146 . Heart attack Father 663 .  Idiopathic pulmonary fibrosis Brother 770 . Cancer Brother        sarcoma  . Heart disease Brother   . Colon cancer Neg Hx   . Esophageal cancer Neg Hx   . Stomach cancer Neg Hx      GENETIC COUNSELING/TESTING: No  SOCIAL HISTORY:  Social History   Socioeconomic History  . Marital status: Married    Spouse name: Not on file  . Number of children: 2  . Years of education: Not on file  . Highest education level: Not on file  Occupational History  . Occupation: retired  Tobacco Use  . Smoking status: Former Smoker    Packs/day: 2.00    Years: 30.00    Pack years: 60.00    Types: Cigarettes  Quit date: 07/10/1984    Years since quitting: 36.1  . Smokeless tobacco: Never Used  Vaping Use  . Vaping Use: Never used  Substance and Sexual Activity  . Alcohol use: No    Alcohol/week: 0.0 standard drinks    Comment: no alcoho since 1983  . Drug use: No  . Sexual activity: Not on file  Other Topics Concern  . Not on file  Social History Narrative   Home Situation: lives with husband. 2 daughters, 2 step daughters, 6 grandkids. 2 greatgrandkids    Work or School: very active in [employer] and church      Spiritual Beliefs: Darrick Meigs      Lifestyle:tries to walk; diet ok - denies any alcohol or tobacco use in many many years      Caffeine 1 cup coffee/day      Hobbies: news junkie, audio books      Left Handed      Lives in one story home   Social Determinants of Health   Financial Resource Strain: Low Risk   . Difficulty of Paying Living Expenses: Not hard at all  Food Insecurity: No Food Insecurity  . Worried About Charity fundraiser in the Last Year: Never true  . Ran Out of Food in the Last Year: Never true  Transportation Needs: No Transportation Needs  . Lack of Transportation (Medical): No  . Lack of Transportation (Non-Medical): No  Physical Activity: Inactive  . Days of Exercise per Week: 0 days  . Minutes of Exercise per Session: 0 min  Stress: Stress  Concern Present  . Feeling of Stress : To some extent  Social Connections: Socially Integrated  . Frequency of Communication with Friends and Family: More than three times a week  . Frequency of Social Gatherings with Friends and Family: More than three times a week  . Attends Religious Services: More than 4 times per year  . Active Member of Clubs or Organizations: Yes  . Attends Archivist Meetings: 1 to 4 times per year  . Marital Status: Married  Human resources officer Violence: Not At Risk  . Fear of Current or Ex-Partner: No  . Emotionally Abused: No  . Physically Abused: No  . Sexually Abused: No     OBSERVATIONS/OBJECTIVE:  Patient appears well over the phone. Speech is clear and intact. Mood/affect appear normal. No cough or conversational dyspnea.   LABORATORY DATA:  None for this visit.  DIAGNOSTIC IMAGING:  None for this visit.      ASSESSMENT AND PLAN:  Sheila Shields is a pleasant 86 y.o. female with Stage IA left breast invasive lobular carcinoma, ER+/PR+/HER2-, diagnosed in 02/2020, treated with lumpectomy, adjuvant radiation therapy, and anti-estrogen therapy with Tamoxifen (for arthritis) beginning in 06/2020  She presents to the Survivorship Clinic for our initial meeting and routine follow-up post-completion of treatment for breast cancer.    1. Stage IA left breast cancer:  Sheila Shields appears to have recovered well from definitive treatment for breast cancer. She will follow-up with her medical oncologist, Dr. Burr Medico in 6 months with history and physical exam per surveillance protocol.  She will continue her anti-estrogen therapy with Tamoxifen. Thus far, she is tolerating well without significant side effects. She was instructed to make Dr. Burr Medico or myself aware if she begins to experience any worsening side effects of the medication and I could see her back in clinic to help manage those side effects, as needed. Her mammogram is due in August; orders  placed today.  Today, a comprehensive survivorship care plan and treatment summary was reviewed with the patient today detailing her breast cancer diagnosis, treatment course, potential late/long-term effects of treatment, appropriate follow-up care with recommendations for the future, and patient education resources.  A copy of this summary, along with a letter will be sent to the patient's primary care provider via In Basket message after today's visit.    2. Left breast seroma: requires intermittent draining per Dr. Barry Dienes. Not currently an issue. Patient monitors and knows when to call provider.   3. Bone health:  Given Sheila Shields's age/history, she is at risk for bone demineralization.  Her last DEXA scan was normal in 2016. I recommend PCP to repeat this year. In the meantime, she was encouraged to increase her consumption of foods rich in calcium, as well as increase her weight-bearing activities.  She was given education on specific activities to promote bone health.  4. Cancer screening:  Due to Sheila Shields's history and her age, she should receive screening for skin cancers, colon cancer, and gynecologic cancers.  She is s/p hysterectomy. He Shields to call GI to determine if/when she will do colonoscopy. Last one done a number of years ago. She follows with a dermatologist given her h/o melanoma 10 years ago. The information and recommendations are listed on the patient's comprehensive care plan/treatment summary and were reviewed in detail with the patient.    5. Health maintenance and wellness promotion: Sheila Shields was encouraged to consume 5-7 servings of fruits and vegetables per day. We reviewed the "Nutrition Rainbow" handout, as well as the handout "Take Control of Your Health and Reduce Your Cancer Risk" from the [place].  She was also encouraged to engage in moderate to vigorous exercise for 30 minutes per day most days of the week. We discussed the [employer] fitness program, which is  designed for cancer survivors to help them become more physically fit after cancer treatments.  She was instructed to limit her alcohol consumption and continue to abstain from tobacco use.     6. Support services/counseling: It is not uncommon for this period of the patient's cancer care trajectory to be one of many emotions and stressors.  We discussed how this can be increasingly difficult during the times of quarantine and social distancing due to the COVID-19 pandemic.   She was given information regarding our available services and encouraged to contact me with any questions or for help enrolling in any of our support group/programs.    Follow up instructions:    -Return to cancer center in 6 months  -Mammogram due in August, with new baseline DEXA   -Follow up with surgery in 9 months, or sooner if seroma becomes bothersome -Continue Tamoxifen   She is welcome to return back to the Survivorship Clinic at any time; no additional follow-up needed at this time. Consider referral back to survivorship as a long-term survivor for continued surveillance  The patient was provided an opportunity to ask questions and all were answered. The patient agreed with the plan and demonstrated an understanding of the instructions.  The patient was advised to call back or seek an in-person evaluation if the symptoms worsen or if the condition fails to improve as anticipated.   Orders Placed This Encounter  Procedures  . MM DIAG BREAST TOMO BILATERAL    Standing Status:   Future    Standing Expiration Date:   08/30/2021    Order Specific Question:  Reason for Exam (SYMPTOM  OR DIAGNOSIS REQUIRED)    Answer:   h/o left breast lobular carcinoma 02/2020 s/p lumpectomy, RT, tamoxifen    Order Specific Question:   Preferred imaging location?    Answer:   [facility]  . DG Bone Density    Standing Status:   Future    Standing Expiration Date:   08/30/2021    Order Specific Question:   Reason for Exam  (SYMPTOM  OR DIAGNOSIS REQUIRED)    Answer:   new baseline, normal in 2016    Order Specific Question:   Preferred imaging location?    Answer:   [facility]    I provided 30 minutes of non-face-to-face time during this encounter.   Alla Feeling, NP

## 2020-08-30 ENCOUNTER — Encounter: Payer: Self-pay | Admitting: Nurse Practitioner

## 2020-08-30 ENCOUNTER — Telehealth: Payer: Self-pay | Admitting: Internal Medicine

## 2020-08-30 ENCOUNTER — Inpatient Hospital Stay: Payer: Medicare HMO | Attending: Hematology | Admitting: Nurse Practitioner

## 2020-08-30 DIAGNOSIS — Z170 Estrogen receptor positive status [ER+]: Secondary | ICD-10-CM

## 2020-08-30 DIAGNOSIS — C50412 Malignant neoplasm of upper-outer quadrant of left female breast: Secondary | ICD-10-CM

## 2020-08-31 ENCOUNTER — Telehealth: Payer: Self-pay | Admitting: Nurse Practitioner

## 2020-08-31 NOTE — Telephone Encounter (Signed)
Checked out appointment. No LOS notes needing to be scheduled. No changes made. 

## 2020-09-08 DIAGNOSIS — M13842 Other specified arthritis, left hand: Secondary | ICD-10-CM | POA: Diagnosis not present

## 2020-09-08 DIAGNOSIS — M13841 Other specified arthritis, right hand: Secondary | ICD-10-CM | POA: Diagnosis not present

## 2020-09-08 DIAGNOSIS — M65841 Other synovitis and tenosynovitis, right hand: Secondary | ICD-10-CM | POA: Diagnosis not present

## 2020-09-21 ENCOUNTER — Other Ambulatory Visit: Payer: Self-pay | Admitting: Hematology

## 2020-09-22 DIAGNOSIS — H5213 Myopia, bilateral: Secondary | ICD-10-CM | POA: Diagnosis not present

## 2020-09-22 DIAGNOSIS — Z961 Presence of intraocular lens: Secondary | ICD-10-CM | POA: Diagnosis not present

## 2020-09-22 DIAGNOSIS — H26493 Other secondary cataract, bilateral: Secondary | ICD-10-CM | POA: Diagnosis not present

## 2020-09-22 DIAGNOSIS — H43813 Vitreous degeneration, bilateral: Secondary | ICD-10-CM | POA: Diagnosis not present

## 2020-09-22 DIAGNOSIS — H524 Presbyopia: Secondary | ICD-10-CM | POA: Diagnosis not present

## 2020-09-22 DIAGNOSIS — H40013 Open angle with borderline findings, low risk, bilateral: Secondary | ICD-10-CM | POA: Diagnosis not present

## 2020-10-11 NOTE — Progress Notes (Signed)
   I, Wendy Poet, LAT, ATC, am serving as scribe for Dr. Lynne Leader.  Sheila Shields is a 86 y.o. female who presents to [place] at [facility] today for f/u of L leg/knee pain.  She was last seen by Dr. Georgina Snell on 07/14/20 and had a L knee injection. Since her last visit, pt reports L knee started hurting again about a week ago. Pt has an upcoming trip to [place] and she will be flying. Pt reports much relief and benefit from prior L knee steroid injection.  Diagnostic imaging: L knee XR- 07/14/20  Pertinent review of systems: No fevers or chills  Relevant historical information: COPD, history of breast cancer.   Exam:  BP (!) 146/90   Pulse 74   Ht 5\' 5"  (1.651 m)   Wt 193 lb 6.4 oz (87.7 kg)   SpO2 (!) 86%   BMI 32.18 kg/m  General: Well Developed, well nourished, and in no acute distress.   MSK: Knee mild effusion normal motion with crepitation.  Normal gait.    Lab and Radiology Results  Procedure: Real-time Ultrasound Guided Injection of left knee superior lateral patellar space Device: Philips Affiniti 50G Due to technical error images were not able to be stored.  No charge for the ultrasound component of the injection. Verbal informed consent obtained.  Discussed risks and benefits of procedure. Warned about infection bleeding damage to structures skin hypopigmentation and fat atrophy among others. Patient expresses understanding and agreement Time-out conducted.   Noted no overlying erythema, induration, or other signs of local infection.   Skin prepped in a sterile fashion.   Local anesthesia: Topical Ethyl chloride.   With sterile technique and under real time ultrasound guidance:  40 mg of Kenalog and 2 mL of Marcaine injected into knee joint. Fluid seen entering the joint capsule.   Completed without difficulty   Pain immediately resolved suggesting accurate placement of the medication.   Advised to call if fevers/chills, erythema, induration,  drainage, or persistent bleeding.   Images permanently stored and available for review in the ultrasound unit.  Impression: Technically successful ultrasound guided injection.    EXAM: LEFT KNEE 3 VIEWS  COMPARISON:  08/29/2007.  FINDINGS: Physiologic alignment. No fracture or focal osseous lesion. Superior patellar enthesophyte. Mild tricompartmental joint space loss, subchondral sclerosis and degenerative spurring. Prominence of the soft tissues with trace joint effusion.  IMPRESSION: Mild osteoarthrosis.  No acute osseous abnormality.   Electronically Signed   By: Primitivo Gauze M.D.   On: 07/14/2020 14:53   I, Lynne Leader, personally (independently) visualized and performed the interpretation of the images attached in this note.    Assessment and Plan: 86 y.o. female with left knee pain due to DJD.  Plan for steroid injection today.  Recheck as needed.  Continue Voltaren gel and home exercise program.   PDMP not reviewed this encounter. No orders of the defined types were placed in this encounter.  No orders of the defined types were placed in this encounter.    Discussed warning signs or symptoms. Please see discharge instructions. Patient expresses understanding.   The above documentation has been reviewed and is accurate and complete Lynne Leader, M.D.

## 2020-10-12 ENCOUNTER — Ambulatory Visit: Payer: Medicare HMO | Admitting: Family Medicine

## 2020-10-12 ENCOUNTER — Other Ambulatory Visit: Payer: Self-pay

## 2020-10-12 ENCOUNTER — Ambulatory Visit: Payer: Self-pay

## 2020-10-12 VITALS — BP 146/90 | HR 74 | Ht 65.0 in | Wt 193.4 lb

## 2020-10-12 DIAGNOSIS — M79605 Pain in left leg: Secondary | ICD-10-CM

## 2020-10-12 NOTE — Patient Instructions (Signed)
Thank you for coming in today.  Have fun in [place].  You received an injection today. Seek immediate medical attention if the joint becomes red, extremely painful, or is oozing fluid.  Please follow up as needed.

## 2020-10-18 ENCOUNTER — Encounter: Payer: Self-pay | Admitting: Family Medicine

## 2020-11-02 ENCOUNTER — Other Ambulatory Visit: Payer: Self-pay | Admitting: Family Medicine

## 2020-11-23 ENCOUNTER — Other Ambulatory Visit: Payer: Self-pay

## 2020-11-23 ENCOUNTER — Encounter: Payer: Self-pay | Admitting: Family Medicine

## 2020-11-23 ENCOUNTER — Ambulatory Visit (INDEPENDENT_AMBULATORY_CARE_PROVIDER_SITE_OTHER): Payer: Medicare HMO | Admitting: Family Medicine

## 2020-11-23 VITALS — BP 110/56 | HR 81 | Temp 98.0°F | Ht 65.0 in | Wt 194.0 lb

## 2020-11-23 DIAGNOSIS — I10 Essential (primary) hypertension: Secondary | ICD-10-CM

## 2020-11-23 DIAGNOSIS — E782 Mixed hyperlipidemia: Secondary | ICD-10-CM | POA: Diagnosis not present

## 2020-11-23 DIAGNOSIS — I350 Nonrheumatic aortic (valve) stenosis: Secondary | ICD-10-CM | POA: Diagnosis not present

## 2020-11-23 DIAGNOSIS — F1021 Alcohol dependence, in remission: Secondary | ICD-10-CM

## 2020-11-23 DIAGNOSIS — I700 Atherosclerosis of aorta: Secondary | ICD-10-CM

## 2020-11-23 DIAGNOSIS — I671 Cerebral aneurysm, nonruptured: Secondary | ICD-10-CM

## 2020-11-23 DIAGNOSIS — R7309 Other abnormal glucose: Secondary | ICD-10-CM

## 2020-11-23 DIAGNOSIS — R69 Illness, unspecified: Secondary | ICD-10-CM | POA: Diagnosis not present

## 2020-11-23 DIAGNOSIS — J410 Simple chronic bronchitis: Secondary | ICD-10-CM

## 2020-11-23 LAB — COMPREHENSIVE METABOLIC PANEL
ALT: 14 U/L (ref 0–35)
AST: 16 U/L (ref 0–37)
Albumin: 3.9 g/dL (ref 3.5–5.2)
Alkaline Phosphatase: 54 U/L (ref 39–117)
BUN: 18 mg/dL (ref 6–23)
CO2: 31 mEq/L (ref 19–32)
Calcium: 9.3 mg/dL (ref 8.4–10.5)
Chloride: 105 mEq/L (ref 96–112)
Creatinine, Ser: 0.73 mg/dL (ref 0.40–1.20)
GFR: 75.84 mL/min (ref >60.00)
Glucose, Bld: 120 mg/dL — ABNORMAL HIGH (ref 70–99)
Potassium: 3.5 mEq/L (ref 3.5–5.1)
Sodium: 143 mEq/L (ref 135–145)
Total Bilirubin: 0.5 mg/dL (ref 0.2–1.2)
Total Protein: 6.3 g/dL (ref 6.0–8.3)

## 2020-11-23 LAB — LIPID PANEL
Cholesterol: 131 mg/dL (ref 0–200)
HDL: 52.5 mg/dL (ref >39.00)
LDL Cholesterol: 42 mg/dL (ref 0–99)
NonHDL: 78.98
Total CHOL/HDL Ratio: 3
Triglycerides: 185 mg/dL — ABNORMAL HIGH (ref 0.0–149.0)
VLDL: 37 mg/dL (ref 0.0–40.0)

## 2020-11-23 LAB — CBC WITH DIFFERENTIAL/PLATELET
Basophils Absolute: 0.1 10*3/uL (ref 0.0–0.1)
Basophils Relative: 0.9 % (ref 0.0–3.0)
Eosinophils Absolute: 0.3 10*3/uL (ref 0.0–0.7)
Eosinophils Relative: 3.8 % (ref 0.0–5.0)
HCT: 42.2 % (ref 36.0–46.0)
Hemoglobin: 14.5 g/dL (ref 12.0–15.0)
Lymphocytes Relative: 13.5 % (ref 12.0–46.0)
Lymphs Abs: 1 10*3/uL (ref 0.7–4.0)
MCHC: 34.3 g/dL (ref 30.0–36.0)
MCV: 94.3 fl (ref 78.0–100.0)
Monocytes Absolute: 0.8 10*3/uL (ref 0.1–1.0)
Monocytes Relative: 11.1 % (ref 3.0–12.0)
Neutro Abs: 5.4 10*3/uL (ref 1.4–7.7)
Neutrophils Relative %: 70.7 % (ref 43.0–77.0)
Platelets: 230 10*3/uL (ref 150.0–400.0)
RBC: 4.47 Mil/uL (ref 3.87–5.11)
RDW: 13.5 % (ref 11.5–15.5)
WBC: 7.6 10*3/uL (ref 4.0–10.5)

## 2020-11-23 MED ORDER — HYDROCHLOROTHIAZIDE 25 MG PO TABS: 0.5000 | ORAL_TABLET | Freq: Every day | ORAL | 3 refills | Status: DC

## 2020-11-23 MED ORDER — POTASSIUM CHLORIDE CRYS ER 20 MEQ PO TBCR: 20.0000 meq | EXTENDED_RELEASE_TABLET | ORAL | 3 refills | Status: DC

## 2020-11-23 NOTE — Progress Notes (Signed)
Phone [phone] In person visit   Subjective:   Sheila Shields is a 86 y.o. year old very pleasant female patient who presents for/with See problem oriented charting Chief Complaint  Patient presents with  . Hypertension  . Hyperlipidemia  . Gastroesophageal Reflux   This visit occurred during the SARS-CoV-2 public health emergency.  Safety protocols were in place, including screening questions prior to the visit, additional usage of staff PPE, and extensive cleaning of exam room while observing appropriate contact time as indicated for disinfecting solutions.   Past Medical History-  Patient Active Problem List   Diagnosis Date Noted  . Mild aortic stenosis 05/24/2020    Priority: High  . Brain aneurysm 12/30/2018    Priority: High  . Chronic cough 10/11/2015    Priority: High  . COPD (chronic obstructive pulmonary disease) ([place]) 11/18/2014    Priority: High  . HYPERGLYCEMIA 08/27/2007    Priority: Medium  . Gastroesophageal reflux disease 05/01/2007    Priority: Medium  . Hyperlipemia 12/17/2006    Priority: Medium  . Essential hypertension 12/17/2006    Priority: Medium  . Former smoker 05/22/2019    Priority: Low  . Aortic atherosclerosis ([place]) 04/11/2018    Priority: Low  . Allergic rhinitis 11/09/2017    Priority: Low  . Arm mass, left 04/16/2017    Priority: Low  . Iron deficiency anemia, unspecified 01/20/2011    Priority: Low  . Arthropathy 02/23/2010    Priority: Low  . Alcoholism in remission ([place]) 08/27/2007    Priority: Low  . Insomnia 05/24/2020  . Malignant neoplasm of upper-outer quadrant of left breast in female, estrogen receptor positive ([place]) 02/12/2020    Medications- reviewed and updated Current Outpatient Medications  Medication Sig Dispense Refill  . acetaminophen (TYLENOL) 650 MG CR tablet Take 1,300 mg by mouth 2 (two) times daily.    Marland Kitchen albuterol (PROAIR HFA) 108 (90 Base) MCG/ACT inhaler INHALE 2 PUFFS EVERY 4 HOURS AS NEEDED FOR  COUGHING SPELLS 18 g 5  . aspirin 325 MG tablet Take 1 tablet (325 mg total) by mouth daily. 30 tablet 3  . clopidogrel (PLAVIX) 75 MG tablet Take 35.5 mg by mouth daily.    . Coenzyme Q10 (COQ10) 100 MG CAPS Take 300 capsules by mouth daily.     . Cyanocobalamin (VITAMIN B-12 PO) Take by mouth.    . diclofenac Sodium (VOLTAREN) 1 % GEL Apply 2 g topically 4 (four) times daily. 100 g 11  . ferrous sulfate 325 (65 FE) MG tablet TAKE 1 TABLET BY MOUTH EVERY DAY (Patient taking differently: Take 325 mg by mouth daily with breakfast.) 90 tablet 3  . fexofenadine (ALLEGRA) 180 MG tablet Take 180 mg by mouth daily.    . fluticasone (FLONASE) 50 MCG/ACT nasal spray Place 2 sprays into both nostrils daily. (Patient taking differently: Place 2 sprays into both nostrils daily as needed.) 16 g 2  . Multiple Vitamin (MULTIVITAMIN WITH MINERALS) TABS tablet Take 1 tablet by mouth daily.    . pantoprazole (PROTONIX) 20 MG tablet TAKE ONE TABLET BY MOUTH DAILY 90 tablet 3  . Propylene Glycol (SYSTANE BALANCE OP) Place 1 drop into both eyes every 6 (six) hours as needed (dry eyes).    . rosuvastatin (CRESTOR) 20 MG tablet TAKE ONE TABLET BY MOUTH DAILY 90 tablet 3  . tamoxifen (NOLVADEX) 20 MG tablet TAKE ONE TABLET BY MOUTH DAILY 30 tablet 6  . traZODone (DESYREL) 50 MG tablet Take 0.5-1 tablets (25-50 mg  total) by mouth at bedtime as needed for sleep. 30 tablet 3  . Turmeric (QC TUMERIC COMPLEX PO) Take by mouth.    . Vitamin D, Cholecalciferol, 1000 units CAPS Take 1,000 Units by mouth daily.     . hydrochlorothiazide (HYDRODIURIL) 25 MG tablet Take 0.5 tablets (12.5 mg total) by mouth daily. 46 tablet 3  . potassium chloride SA (KLOR-CON M20) 20 MEQ tablet Take 1 tablet (20 mEq total) by mouth every other day. 46 tablet 3   No current facility-administered medications for this visit.     Objective:  BP (!) 110/56   Pulse 81   Temp 98 F (36.7 C) (Temporal)   Ht 5\' 5"  (1.651 m)   Wt 194 lb (88 kg)    SpO2 91%   BMI 32.28 kg/m  Gen: NAD, resting comfortably CV: RRR no murmurs rubs or gallops, slight ejection 9-4/7 systolic murmur Lungs: CTAB no crackles, wheeze, rhonchi Abdomen: soft/nontender/nondistended/normal bowel sounds. No rebound or guarding.  Ext: trace edema bilaterally  Skin: warm, dry Neuro: grossly normal, moves all extremities     Assessment and Plan  #breast cancer left breast discovered 02/10/20- Dr. Barry Dienes surgeon and Dr. Burr Medico alternates visits every 6 months. On chronic tamoxifen after definitive therapy-She is doing well today- July 2022 mammography and then follow up in August 2022-She does have frequent hot flashes on Tamoxifen-Visits oncologist every 6 months  #History of brain aneurysm-follows with Dr. Estanislado Pandy- half dose plavix, full dose aspirin  #hypertension S: medication: Hydrochlorothiazide 25 mg-also takes potassium daily Home readings #s: Averages 112-115/70s  BP Readings from Last 3 Encounters:  11/23/20 (!) 110/56  10/12/20 (!) 146/90  08/26/20 (!) 150/70  A/P: Blood pressure running low today in office as well as low at home-we opted to reduce hydrochlorothiazide to 12.5 mg and reduce potassium to every other day-she will let me know if running high  #hyperlipidemia S: Medication: Rosuvastatin 20 mg daily-previously on simvastatin and had myalgias and arthralgias. Also did not tolerate atorvastatin Lab Results  Component Value Date   CHOL 131 11/23/2020   HDL 52.50 11/23/2020   LDLCALC 42 11/23/2020   LDLDIRECT 55 05/24/2020   TRIG 185.0 (H) 11/23/2020   CHOLHDL 3 11/23/2020  A/P: Excellent control today-labs of already come back-we will continue current medication  Patient with aortic atherosclerosis presumed stable- LDL being this well controlled will certainly be helpful  # GERD S:Medication: Protonix 20 mg-tolerating well   A/P: Good control -Discussed trial of Pepcid but patient declines  #COPD-was told Dr. Lamonte Sakai in the past S:  Medication: Albuterol as needed-typically 2-3 per week-She has some episodes of shortness of breath and coughing when making her bed in the morning but nothing that is not her baseline.  A/P: Patient reports overall stable-chronic cough still noted.  Continue to monitor-oxygen low normal today but okay as long as over 88%  #Anemia- patient with history of anemia- will trial off iron starting a week from now  when she finishes current pillbox- check CBC and ferritin next visit  #Insomnia S: Patient with intermittent issues for 25 years with sleep. Trial of trazodone 50 mg November 2021-prefer to avoid -Benzodiazepines and Ambien with alcoholism history -Could also consider CBT-I A/P: Well-controlled with intermittent use of trazodone-continue current medication   #Fluid in left ear-Patient reports that she attended her grandson's wedding recently and it was difficult for her to hear the wedding clearly.  A/P: Exam normal-patient reports symptoms are better  #Mild aortic stenosis discovered 06/29/2020-  we will plan to repeat every 2 to 3 years.  Stable murmur today  #Joint pain and history sciatica- turmeric helpful, sparing tramadol  for joint pain-I am willing to refill if needed  #alcoholism in remissoin-39 years sober in 2022-she is really excited about this-We congratulated her to keep pushing for more years of being sober.     # Hyperglycemia/insulin resistance/prediabetes-peak A1c 6.4 in 2018-weight loss since that time -I intended to check A1c today-does not look like I added this to labs though-we will plan on doing this at next visit    Recommended follow OZ:YYQMGN in about 6 months (around 05/26/2021) for follow up- or sooner if needed. Future Appointments  Date Time Provider [place]  02/08/2021 11:00 AM [facility] DX DEXA 1 [facility]  02/08/2021 11:40 AM [facility] DIAG TOMO 1 [facility]  02/23/2021  1:00 PM [facility] LAB [facility] None  02/23/2021   1:40 PM Truitt Merle, MD [facility] None  05/26/2021 11:00 AM Marin Olp, MD [facility]  08/11/2021  8:45 AM [facility] HEALTH COACH [facility]    Lab/Order associations:   ICD-10-CM   1. Mixed hyperlipidemia  E78.2 CBC with Differential/Platelet    Comprehensive metabolic panel    Lipid panel    Meds ordered this encounter  Medications  . hydrochlorothiazide (HYDRODIURIL) 25 MG tablet    Sig: Take 0.5 tablets (12.5 mg total) by mouth daily.    Dispense:  46 tablet    Refill:  3  . potassium chloride SA (KLOR-CON M20) 20 MEQ tablet    Sig: Take 1 tablet (20 mEq total) by mouth every other day.    Dispense:  46 tablet    Refill:  3   I,Harris Phan,acting as a scribe for Garret Reddish, MD.,have documented all relevant documentation on the behalf of Garret Reddish, MD,as directed by  Garret Reddish, MD while in the presence of Garret Reddish, MD.   I, Garret Reddish, MD, have reviewed all documentation for this visit. The documentation on 11/23/20 for the exam, diagnosis, procedures, and orders are all accurate and complete.   Return precautions advised.  Garret Reddish, MD

## 2020-11-23 NOTE — Patient Instructions (Addendum)
Monitor blood pressure at home.  We are reducing hydrochlorothiazide to half tablet-if blood pressure gets above 135/85 on average please let us know and we can increase back up.  Also reduce potassium to every other day.  Please stop by lab before you go If you have mychart- we will send your results within 3 business days of [place] receiving them.  If you do not have mychart- we will call you about results within 5 business days of [place] receiving them.  *please also note that you will see labs on mychart as soon as they post. I will later go in and write notes on them- will say "notes from Dr. Yong Channel"   Recommended follow up: Return in about 6 months (around 05/26/2021) for follow up- or sooner if needed.

## 2020-11-30 ENCOUNTER — Encounter: Payer: Self-pay | Admitting: Family Medicine

## 2020-12-03 DIAGNOSIS — M25571 Pain in right ankle and joints of right foot: Secondary | ICD-10-CM | POA: Diagnosis not present

## 2020-12-15 DIAGNOSIS — D692 Other nonthrombocytopenic purpura: Secondary | ICD-10-CM | POA: Diagnosis not present

## 2020-12-15 DIAGNOSIS — L570 Actinic keratosis: Secondary | ICD-10-CM | POA: Diagnosis not present

## 2020-12-15 DIAGNOSIS — L820 Inflamed seborrheic keratosis: Secondary | ICD-10-CM | POA: Diagnosis not present

## 2020-12-15 DIAGNOSIS — L821 Other seborrheic keratosis: Secondary | ICD-10-CM | POA: Diagnosis not present

## 2020-12-15 DIAGNOSIS — D1801 Hemangioma of skin and subcutaneous tissue: Secondary | ICD-10-CM | POA: Diagnosis not present

## 2020-12-17 ENCOUNTER — Telehealth: Payer: Self-pay | Admitting: Family Medicine

## 2020-12-17 NOTE — Progress Notes (Signed)
  Chronic Care Management   Outreach Note  12/17/2020 Name: Sheila Shields MRN: [id number] DOB: 08/11/1934  Referred by: Marin Olp, MD Reason for referral : No chief complaint on file.   An unsuccessful telephone outreach was attempted today. The patient was referred to the pharmacist for assistance with care management and care coordination.   Follow Up Plan:   Lauretta Grill Upstream Scheduler

## 2020-12-20 ENCOUNTER — Telehealth: Payer: Self-pay | Admitting: Family Medicine

## 2020-12-20 NOTE — Chronic Care Management (AMB) (Signed)
  Chronic Care Management   Outreach Note  12/20/2020 Name: Sheila Shields MRN: [id number] DOB: March 28, 1935  Referred by: Marin Olp, MD Reason for referral : No chief complaint on file.   A second unsuccessful telephone outreach was attempted today. The patient was referred to pharmacist for assistance with care management and care coordination.  Follow Up Plan:   Lauretta Grill Upstream Scheduler

## 2021-01-28 ENCOUNTER — Telehealth: Payer: Self-pay | Admitting: Family Medicine

## 2021-01-28 NOTE — Chronic Care Management (AMB) (Signed)
  Chronic Care Management   Outreach Note  01/28/2021 Name: Sheila Shields MRN: [id number] DOB: 1935/06/12  Referred by: Marin Olp, MD Reason for referral : No chief complaint on file.   Third unsuccessful telephone outreach was attempted today. The patient was referred to the pharmacist for assistance with care management and care coordination.   Follow Up Plan:   Lauretta Grill Upstream Scheduler

## 2021-02-01 ENCOUNTER — Encounter: Payer: Self-pay | Admitting: Family Medicine

## 2021-02-01 ENCOUNTER — Other Ambulatory Visit: Payer: Self-pay | Admitting: Neurology

## 2021-02-01 NOTE — Telephone Encounter (Signed)
Please refill for patient per Dr Tomi Likens

## 2021-02-01 NOTE — Telephone Encounter (Signed)
Ok to refill? Patient was reduce to 1/2 tablet on that appointment on 08/26/2020 or sent to PCP?

## 2021-02-02 ENCOUNTER — Other Ambulatory Visit (HOSPITAL_COMMUNITY): Payer: Self-pay | Admitting: Interventional Radiology

## 2021-02-02 DIAGNOSIS — I671 Cerebral aneurysm, nonruptured: Secondary | ICD-10-CM

## 2021-02-03 ENCOUNTER — Encounter: Payer: Self-pay | Admitting: Physician Assistant

## 2021-02-03 ENCOUNTER — Ambulatory Visit (INDEPENDENT_AMBULATORY_CARE_PROVIDER_SITE_OTHER): Payer: Medicare HMO | Admitting: Physician Assistant

## 2021-02-03 ENCOUNTER — Other Ambulatory Visit: Payer: Self-pay

## 2021-02-03 VITALS — BP 125/79 | HR 81 | Temp 97.6°F | Ht 65.0 in | Wt 190.2 lb

## 2021-02-03 DIAGNOSIS — H6982 Other specified disorders of Eustachian tube, left ear: Secondary | ICD-10-CM | POA: Diagnosis not present

## 2021-02-03 NOTE — Progress Notes (Signed)
Acute Office Visit  Subjective:    Patient ID: Sheila Shields, female    DOB: 1934-09-21, 86 y.o.   MRN: [id number]  Chief Complaint  Patient presents with   Ear Fullness    HPI Patient is in today for left ear fullness. Notices that as she moves her head over the last few months sometimes will have pressure or popping. No hearing loss. No tinnitus. No dizziness or headaches.  Past Medical History:  Diagnosis Date   Alcoholism in remission ([place]) 08/27/2007   Anemia    Arthritis    OA   Breast cancer ([place])    left breast   COPD 12/17/2006   Diverticulosis 03/27/2007   GERD 05/01/2007   HYPERGLYCEMIA 08/27/2007   HYPERLIPIDEMIA 12/17/2006   HYPERTENSION 12/17/2006   Mood disorder ([place])    hx anxiety and depression. meds short term during life transition   Pneumonia    AS A CHILD   Rectal polyp 03/27/2007   adenoma   SOB (shortness of breath) on exertion    per patient due to having COPD   TIA (transient ischemic attack)     Past Surgical History:  Procedure Laterality Date   ABDOMINAL HYSTERECTOMY  1978   fibroma   APPENDECTOMY  2002   BREAST EXCISIONAL BIOPSY Right 2003   negative   BREAST LUMPECTOMY WITH RADIOACTIVE SEED LOCALIZATION Left 03/23/2020   Procedure: LEFT BREAST LUMPECTOMY WITH RADIOACTIVE SEED LOCALIZATION;  Surgeon: [name], MD;  Location: [facility];  Service: General;  Laterality: Left;  RNFA   CARPAL TUNNEL RELEASE Left 2010 or 2011   CATARACT EXTRACTION W/ INTRAOCULAR LENS  IMPLANT, BILATERAL Bilateral    COLONOSCOPY W/ POLYPECTOMY  03/27/2007; n7/19/2012   2008: 4 mm adenoma, diverticulosis 2012: ileitis ? NSAID - likely, 2-3 mm cecal polyp LYMPHOID FOLLICLE, diverticulosis   ESOPHAGOGASTRODUODENOSCOPY  01/26/2011   reflux esophagitis, colonscopy done also   HAMMER TOE SURGERY  2008   left foot   HERNIA REPAIR Right 1996?   IR ANGIO INTRA EXTRACRAN SEL COM CAROTID INNOMINATE UNI R MOD SED  11/28/2018   IR ANGIO INTRA EXTRACRAN SEL INTERNAL CAROTID UNI  R MOD SED  12/30/2018   IR ANGIO VERTEBRAL SEL SUBCLAVIAN INNOMINATE UNI R MOD SED  11/28/2018   IR ANGIOGRAM FOLLOW UP STUDY  12/30/2018   IR TRANSCATH/EMBOLIZ  12/30/2018   IR US GUIDE VASC ACCESS RIGHT  11/28/2018   ORIF ANKLE FRACTURE Right 12/22/2014   Procedure: OPEN REDUCTION INTERNAL FIXATION (ORIF) ANKLE FRACTURE;  Surgeon: Susa Day, MD;  Location: [facility];  Service: Orthopedics;  Laterality: Right;   RADIOLOGY WITH ANESTHESIA N/A 12/30/2018   Procedure: RADIOLOGY WITH ANESTHESIA;  Surgeon: Luanne Bras, MD;  Location: [facility];  Service: Radiology;  Laterality: N/A;   SHOULDER OPEN ROTATOR CUFF REPAIR Left 03/06/2013   Procedure: LEFT ROTATOR CUFF REPAIR, SUBACROMIAL DECOMPRESSION, PATCH GRAFT, MANIPULATION UNDER ANESTHESIA;  Surgeon: Johnn Hai, MD;  Location: [facility];  Service: Orthopedics;  Laterality: Left;   TONSILLECTOMY      Family History  Problem Relation Age of Onset   Throat cancer Mother        49   Heart attack Father 83   Idiopathic pulmonary fibrosis Brother 66   Cancer Brother        sarcoma   Heart disease Brother    Colon cancer Neg Hx    Esophageal cancer Neg Hx    Stomach cancer Neg Hx     Social History  Socioeconomic History   Marital status: Married    Spouse name: Not on file   Number of children: 2   Years of education: Not on file   Highest education level: Not on file  Occupational History   Occupation: retired  Tobacco Use   Smoking status: Former    Packs/day: 2.00    Years: 30.00    Pack years: 60.00    Types: Cigarettes    Quit date: 07/10/1984    Years since quitting: 36.5   Smokeless tobacco: Never  Vaping Use   Vaping Use: Never used  Substance and Sexual Activity   Alcohol use: No    Alcohol/week: 0.0 standard drinks    Comment: no alcoho since 1983   Drug use: No   Sexual activity: Not on file  Other Topics Concern   Not on file  Social History Narrative   Home Situation: lives with husband. 2 daughters, 2  step daughters, 6 grandkids. 2 greatgrandkids    Work or School: very active in [employer] and church      Spiritual Beliefs: Christian      Lifestyle:tries to walk; diet ok - denies any alcohol or tobacco use in many many years      Caffeine 1 cup coffee/day      Hobbies: news junkie, audio books      Left Handed      Lives in one story home   Social Determinants of Health   Financial Resource Strain: Low Risk    Difficulty of Paying Living Expenses: Not hard at all  Food Insecurity: No Food Insecurity   Worried About Charity fundraiser in the Last Year: Never true   Arboriculturist in the Last Year: Never true  Transportation Needs: No Transportation Needs   Lack of Transportation (Medical): No   Lack of Transportation (Non-Medical): No  Physical Activity: Inactive   Days of Exercise per Week: 0 days   Minutes of Exercise per Session: 0 min  Stress: Stress Concern Present   Feeling of Stress : To some extent  Social Connections: Engineer, building services of Communication with Friends and Family: More than three times a week   Frequency of Social Gatherings with Friends and Family: More than three times a week   Attends Religious Services: More than 4 times per year   Active Member of [employer] or Organizations: Yes   Attends Archivist Meetings: 1 to 4 times per year   Marital Status: Married  Human resources officer Violence: Not At Risk   Fear of Current or Ex-Partner: No   Emotionally Abused: No   Physically Abused: No   Sexually Abused: No    Outpatient Medications Prior to Visit  Medication Sig Dispense Refill   acetaminophen (TYLENOL) 650 MG CR tablet Take 1,300 mg by mouth 2 (two) times daily.     albuterol (PROAIR HFA) 108 (90 Base) MCG/ACT inhaler INHALE 2 PUFFS EVERY 4 HOURS AS NEEDED FOR COUGHING SPELLS 18 g 5   aspirin 325 MG tablet Take 1 tablet (325 mg total) by mouth daily. 30 tablet 3   clopidogrel (PLAVIX) 75 MG tablet Take 0.5 tablets (37.5 mg total)  by mouth daily. 46 tablet 3   Coenzyme Q10 (COQ10) 100 MG CAPS Take 300 capsules by mouth daily.      Cyanocobalamin (VITAMIN B-12 PO) Take by mouth.     diclofenac Sodium (VOLTAREN) 1 % GEL Apply 2 g topically 4 (four) times daily. 100  g 11   ferrous sulfate 325 (65 FE) MG tablet TAKE 1 TABLET BY MOUTH EVERY DAY (Patient taking differently: Take 325 mg by mouth daily with breakfast.) 90 tablet 3   fexofenadine (ALLEGRA) 180 MG tablet Take 180 mg by mouth daily.     fluticasone (FLONASE) 50 MCG/ACT nasal spray Place 2 sprays into both nostrils daily. (Patient taking differently: Place 2 sprays into both nostrils daily as needed.) 16 g 2   hydrochlorothiazide (HYDRODIURIL) 25 MG tablet Take 0.5 tablets (12.5 mg total) by mouth daily. 46 tablet 3   Multiple Vitamin (MULTIVITAMIN WITH MINERALS) TABS tablet Take 1 tablet by mouth daily.     pantoprazole (PROTONIX) 20 MG tablet TAKE ONE TABLET BY MOUTH DAILY 90 tablet 3   potassium chloride SA (KLOR-CON M20) 20 MEQ tablet Take 1 tablet (20 mEq total) by mouth every other day. 46 tablet 3   Propylene Glycol (SYSTANE BALANCE OP) Place 1 drop into both eyes every 6 (six) hours as needed (dry eyes).     rosuvastatin (CRESTOR) 20 MG tablet TAKE ONE TABLET BY MOUTH DAILY 90 tablet 3   tamoxifen (NOLVADEX) 20 MG tablet TAKE ONE TABLET BY MOUTH DAILY 30 tablet 6   traZODone (DESYREL) 50 MG tablet Take 0.5-1 tablets (25-50 mg total) by mouth at bedtime as needed for sleep. 30 tablet 3   Turmeric (QC TUMERIC COMPLEX PO) Take by mouth.     Vitamin D, Cholecalciferol, 1000 units CAPS Take 1,000 Units by mouth daily.      No facility-administered medications prior to visit.    Allergies  Allergen Reactions   Alcohol Other (See Comments)    Recovering Alcoholic*   Codeine     Patient prefers not to take this due to history of alcoholism   Dilaudid [Hydromorphone Hcl]     Pt had hypotension after dilaudid 1mg  IV while in the OR, required temporary pressor  intervention.    Review of Systems REFER TO HPI FOR PERTINENT POSITIVES AND NEGATIVES     Objective:    Physical Exam HENT:     Right Ear: Hearing, tympanic membrane, ear canal and external ear normal.     Left Ear: Hearing, ear canal and external ear normal. A middle ear effusion is present.    BP 125/79   Pulse 81   Temp 97.6 F (36.4 C)   Ht 5\' 5"  (1.651 m)   Wt 190 lb 4 oz (86.3 kg)   SpO2 (!) 88%   BMI 31.66 kg/m  Wt Readings from Last 3 Encounters:  02/03/21 190 lb 4 oz (86.3 kg)  11/23/20 194 lb (88 kg)  10/12/20 193 lb 6.4 oz (87.7 kg)    Health Maintenance Due  Topic Date Due   COVID-19 Vaccine (5 - Booster for Pfizer series) 09/23/2020    There are no preventive care reminders to display for this patient.   Lab Results  Component Value Date   TSH 3.15 08/11/2014   Lab Results  Component Value Date   WBC 7.6 11/23/2020   HGB 14.5 11/23/2020   HCT 42.2 11/23/2020   MCV 94.3 11/23/2020   PLT 230.0 11/23/2020   Lab Results  Component Value Date   NA 143 11/23/2020   K 3.5 11/23/2020   CO2 31 11/23/2020   GLUCOSE 120 (H) 11/23/2020   BUN 18 11/23/2020   CREATININE 0.73 11/23/2020   BILITOT 0.5 11/23/2020   ALKPHOS 54 11/23/2020   AST 16 11/23/2020   ALT 14 11/23/2020  PROT 6.3 11/23/2020   ALBUMIN 3.9 11/23/2020   CALCIUM 9.3 11/23/2020   ANIONGAP 10 03/17/2020   GFR 75.84 11/23/2020   Lab Results  Component Value Date   CHOL 131 11/23/2020   Lab Results  Component Value Date   HDL 52.50 11/23/2020   Lab Results  Component Value Date   LDLCALC 42 11/23/2020   Lab Results  Component Value Date   TRIG 185.0 (H) 11/23/2020   Lab Results  Component Value Date   CHOLHDL 3 11/23/2020   Lab Results  Component Value Date   HGBA1C 5.8 11/19/2019       Assessment & Plan:   Problem List Items Addressed This Visit   None Visit Diagnoses     Chronic dysfunction of left eustachian tube    -  Primary      1. Chronic  dysfunction of left eustachian tube Reassured patient no infection or cerumen build up. She does have signs of ETD. Encouraged plenty of fluids and trial of Flonase. She will continue regular use of allergy medication as well. Consider prednisone or ENT if worse or no better.    Nilza Eaker M Atsushi Yom, PA-C

## 2021-02-03 NOTE — Patient Instructions (Signed)
Good news! No infection or wax. Try Flonase in this nostril - 2 squirts daily for the next 2-4 weeks. If still no improvement, consider oral prednisone or ENT.  Recheck prn.

## 2021-02-04 NOTE — Progress Notes (Signed)
I, Peterson Lombard, LAT, ATC acting as a scribe for Lynne Leader, MD.  Sheila Shields is a 86 y.o. female who presents to [place] at [facility] today for continued L leg/knee pain due to DJD. Pt was last seen by Dr. Georgina Snell on 10/12/20 and was given a L knee steroid injection and was advised to cont HEP and Voltaren gel. Today, pt reports that her knee pain is worse with weight bearing. Patient is getting ready to move and would like another injection to get her through the move.   Dx imaging: 07/14/20 L knee XR  Pertinent review of systems: No fevers or chills  Relevant historical information: Hypertension.  COPD.   Exam:  BP 122/74   Pulse 77   Ht 5\' 5"  (1.651 m)   Wt 191 lb (86.6 kg)   SpO2 90%   BMI 31.78 kg/m  General: Well Developed, well nourished, and in no acute distress.   MSK: Left knee mild effusion normal motion with crepitation.    Lab and Radiology Results  Procedure: Real-time Ultrasound Guided Injection of left knee superior lateral patellar space Device: Philips Affiniti 50G Images permanently stored and available for review in PACS Verbal informed consent obtained.  Discussed risks and benefits of procedure. Warned about infection bleeding damage to structures skin hypopigmentation and fat atrophy among others. Patient expresses understanding and agreement Time-out conducted.   Noted no overlying erythema, induration, or other signs of local infection.   Skin prepped in a sterile fashion.   Local anesthesia: Topical Ethyl chloride.   With sterile technique and under real time ultrasound guidance: 40 mg of Kenalog and 2 mL of Marcaine injected into knee joint. Fluid seen entering the joint capsule.   Completed without difficulty   Pain immediately resolved suggesting accurate placement of the medication.   Advised to call if fevers/chills, erythema, induration, drainage, or persistent bleeding.   Images permanently stored and available for  review in the ultrasound unit.  Impression: Technically successful ultrasound guided injection.    EXAM: LEFT KNEE 3 VIEWS   COMPARISON:  08/29/2007.   FINDINGS: Physiologic alignment. No fracture or focal osseous lesion. Superior patellar enthesophyte. Mild tricompartmental joint space loss, subchondral sclerosis and degenerative spurring. Prominence of the soft tissues with trace joint effusion.   IMPRESSION: Mild osteoarthrosis.  No acute osseous abnormality.     Electronically Signed   By: Primitivo Gauze M.D.   On: 07/14/2020 14:53   I, Lynne Leader, personally (independently) visualized and performed the interpretation of the images attached in this note.      Assessment and Plan: 86 y.o. female with left knee pain due to osteoarthritis.  Last injection was about 4 months ago.  Plan for repeat injection today.  Patient notes that she will be moving in about 2 months and will expect that her knee is going to hurt a lot more than it is already now.  We will go ahead and authorize hyaluronic acid injections in case the steroid injection wears off sooner than 3 months.  We will go ahead and have her scheduled for about 3 months from now so that we can be ready for the next steroid injection.   PDMP not reviewed this encounter. Orders Placed This Encounter  Procedures   [place] LIMITED JOINT SPACE STRUCTURES LOW LEFT(NO LINKED CHARGES)    Order Specific Question:   Reason for Exam (SYMPTOM  OR DIAGNOSIS REQUIRED)    Answer:   left knee pain  Order Specific Question:   Preferred imaging location?    Answer:   [place]   No orders of the defined types were placed in this encounter.    Discussed warning signs or symptoms. Please see discharge instructions. Patient expresses understanding.   The above documentation has been reviewed and is accurate and complete Lynne Leader, M.D.

## 2021-02-07 ENCOUNTER — Ambulatory Visit: Payer: Self-pay

## 2021-02-07 ENCOUNTER — Ambulatory Visit: Payer: Medicare HMO | Admitting: Family Medicine

## 2021-02-07 ENCOUNTER — Other Ambulatory Visit: Payer: Self-pay

## 2021-02-07 ENCOUNTER — Encounter: Payer: Self-pay | Admitting: Family Medicine

## 2021-02-07 VITALS — BP 122/74 | HR 77 | Ht 65.0 in | Wt 191.0 lb

## 2021-02-07 DIAGNOSIS — G8929 Other chronic pain: Secondary | ICD-10-CM

## 2021-02-07 DIAGNOSIS — M25562 Pain in left knee: Secondary | ICD-10-CM

## 2021-02-07 NOTE — Patient Instructions (Addendum)
Thank you for coming in today.   Call or go to the ER if you develop a large red swollen joint with extreme pain or oozing puss.    Return as needed. I can do this shot every 3 months (Nov 1st).   I will also get the gel shots authorized.

## 2021-02-08 ENCOUNTER — Ambulatory Visit
Admission: RE | Admit: 2021-02-08 | Discharge: 2021-02-08 | Disposition: A | Discharge status: Home / Self Care | Payer: Medicare HMO | Source: Ambulatory Visit | Attending: Nurse Practitioner | Admitting: Nurse Practitioner

## 2021-02-08 DIAGNOSIS — Z780 Asymptomatic menopausal state: Secondary | ICD-10-CM | POA: Diagnosis not present

## 2021-02-08 DIAGNOSIS — Z170 Estrogen receptor positive status [ER+]: Secondary | ICD-10-CM

## 2021-02-08 DIAGNOSIS — C50412 Malignant neoplasm of upper-outer quadrant of left female breast: Secondary | ICD-10-CM

## 2021-02-08 DIAGNOSIS — R922 Inconclusive mammogram: Secondary | ICD-10-CM | POA: Diagnosis not present

## 2021-02-11 ENCOUNTER — Ambulatory Visit (HOSPITAL_COMMUNITY)
Admission: RE | Admit: 2021-02-11 | Discharge: 2021-02-11 | Disposition: A | Discharge status: Home / Self Care | Payer: Medicare HMO | Source: Ambulatory Visit | Attending: Interventional Radiology | Admitting: Interventional Radiology

## 2021-02-11 ENCOUNTER — Other Ambulatory Visit: Payer: Self-pay

## 2021-02-11 DIAGNOSIS — Z8679 Personal history of other diseases of the circulatory system: Secondary | ICD-10-CM | POA: Diagnosis not present

## 2021-02-11 DIAGNOSIS — I671 Cerebral aneurysm, nonruptured: Secondary | ICD-10-CM | POA: Diagnosis not present

## 2021-02-11 DIAGNOSIS — Q283 Other malformations of cerebral vessels: Secondary | ICD-10-CM | POA: Diagnosis not present

## 2021-02-14 ENCOUNTER — Encounter: Payer: Self-pay | Admitting: Family Medicine

## 2021-02-15 ENCOUNTER — Telehealth (HOSPITAL_COMMUNITY): Payer: Self-pay

## 2021-02-15 ENCOUNTER — Other Ambulatory Visit (HOSPITAL_COMMUNITY): Payer: Self-pay | Admitting: Interventional Radiology

## 2021-02-15 DIAGNOSIS — I671 Cerebral aneurysm, nonruptured: Secondary | ICD-10-CM

## 2021-02-15 NOTE — Telephone Encounter (Signed)
Called to schedule consult, no answer, left vm. AW  

## 2021-02-16 NOTE — Telephone Encounter (Signed)
I spoke to pt to give message below. Pt says that she has an appointment with Dr. Estanislado Pandy next Thursday.

## 2021-02-22 ENCOUNTER — Telehealth (INDEPENDENT_AMBULATORY_CARE_PROVIDER_SITE_OTHER): Payer: Medicare HMO | Admitting: Physician Assistant

## 2021-02-22 ENCOUNTER — Ambulatory Visit: Payer: Medicare HMO | Admitting: Family Medicine

## 2021-02-22 ENCOUNTER — Encounter: Payer: Self-pay | Admitting: Family Medicine

## 2021-02-22 ENCOUNTER — Encounter: Payer: Self-pay | Admitting: Physician Assistant

## 2021-02-22 ENCOUNTER — Other Ambulatory Visit: Payer: Self-pay | Admitting: Family Medicine

## 2021-02-22 VITALS — Temp 97.3°F | Ht 65.0 in | Wt 185.0 lb

## 2021-02-22 DIAGNOSIS — U071 COVID-19: Secondary | ICD-10-CM | POA: Diagnosis not present

## 2021-02-22 DIAGNOSIS — J410 Simple chronic bronchitis: Secondary | ICD-10-CM | POA: Diagnosis not present

## 2021-02-22 MED ORDER — PREDNISONE 20 MG PO TABS: 40.0000 mg | ORAL_TABLET | Freq: Every day | ORAL | 0 refills | Status: DC

## 2021-02-22 MED ORDER — MOLNUPIRAVIR EUA 200MG CAPSULE: 4.0000 | ORAL_CAPSULE | Freq: Two times a day (BID) | ORAL | 0 refills | Status: AC

## 2021-02-22 NOTE — Progress Notes (Signed)
Virtual Visit via Video   I connected with Sheila Shields on 02/22/21 at  4:00 PM EDT by a video enabled telemedicine application and verified that I am speaking with the correct person using two identifiers. Location patient: Home Location provider: [facility], Office Persons participating in the virtual visit: Elfrieda, Espino PA-C, Anselmo Pickler, LPN   I discussed the limitations of evaluation and management by telemedicine and the availability of in person appointments. The patient expressed understanding and agreed to proceed.  I acted as a Education administrator for [employer], PA-C [employer], LPN   Subjective:   HPI:   Patient is requesting evaluation for possible COVID-19.  Symptom onset: Yesterday 02/21/2021  Travel/contacts: exposed last week  Vaccination status: Complete  Testing results: husband is positive, her test has been negative  Patient endorses the following symptoms: Dry cough, runny nose, nasal congestion, decrease appetite, sore throat, body aches.  Patient denies the following symptoms: Denies headaches, wheezing, chest pain, SOB, nausea/vomiting  Treatments tried: Tylenol, Robitussin DM this AM, Chloraseptic spray. She does have hx of COPD, using albuterol rarely at this time  Patient risk factors: Current ZOXWR-60 risk of complications score: 7 Smoking status: [name]  reports that she quit smoking about 36 years ago. Her smoking use included cigarettes. She has a 60.00 pack-year smoking history. She has never used smokeless tobacco. If female, currently pregnant? []   Yes [x]   No  ROS: See pertinent positives and negatives per HPI.  Patient Active Problem List   Diagnosis Date Noted   Insomnia 05/24/2020   Mild aortic stenosis 05/24/2020   Malignant neoplasm of upper-outer quadrant of left breast in female, estrogen receptor positive ([place]) 02/12/2020   Former smoker 05/22/2019   Brain aneurysm 12/30/2018   Aortic  atherosclerosis ([place]) 04/11/2018   Allergic rhinitis 11/09/2017   Arm mass, left 04/16/2017   Chronic cough 10/11/2015   COPD (chronic obstructive pulmonary disease) ([place]) 11/18/2014   Iron deficiency anemia, unspecified 01/20/2011   Arthropathy 02/23/2010   HYPERGLYCEMIA 08/27/2007   Alcoholism in remission ([place]) 08/27/2007   Gastroesophageal reflux disease 05/01/2007   Hyperlipemia 12/17/2006   Essential hypertension 12/17/2006    Social History   Tobacco Use   Smoking status: Former    Packs/day: 2.00    Years: 30.00    Pack years: 60.00    Types: Cigarettes    Quit date: 07/10/1984    Years since quitting: 36.6   Smokeless tobacco: Never  Substance Use Topics   Alcohol use: No    Alcohol/week: 0.0 standard drinks    Comment: no alcoho since 1983    Current Outpatient Medications:    acetaminophen (TYLENOL) 650 MG CR tablet, Take 1,300 mg by mouth 2 (two) times daily., Disp: , Rfl:    albuterol (PROAIR HFA) 108 (90 Base) MCG/ACT inhaler, INHALE 2 PUFFS EVERY 4 HOURS AS NEEDED FOR COUGHING SPELLS, Disp: 18 g, Rfl: 5   aspirin 325 MG tablet, Take 1 tablet (325 mg total) by mouth daily., Disp: 30 tablet, Rfl: 3   clopidogrel (PLAVIX) 75 MG tablet, Take 0.5 tablets (37.5 mg total) by mouth daily., Disp: 46 tablet, Rfl: 3   Coenzyme Q10 (COQ10) 100 MG CAPS, Take 300 capsules by mouth daily. , Disp: , Rfl:    Cyanocobalamin (VITAMIN B-12 PO), Take by mouth., Disp: , Rfl:    diclofenac Sodium (VOLTAREN) 1 % GEL, Apply 2 g topically 4 (four) times daily., Disp: 100 g, Rfl: 11  ferrous sulfate 325 (65 FE) MG tablet, TAKE 1 TABLET BY MOUTH EVERY DAY (Patient taking differently: Take 325 mg by mouth daily with breakfast.), Disp: 90 tablet, Rfl: 3   fexofenadine (ALLEGRA) 180 MG tablet, Take 180 mg by mouth daily., Disp: , Rfl:    fluticasone (FLONASE) 50 MCG/ACT nasal spray, Place 2 sprays into both nostrils daily. (Patient taking differently: Place 2 sprays into both nostrils daily as  needed.), Disp: 16 g, Rfl: 2   hydrochlorothiazide (HYDRODIURIL) 25 MG tablet, Take 0.5 tablets (12.5 mg total) by mouth daily., Disp: 46 tablet, Rfl: 3   molnupiravir EUA 200 mg CAPS, Take 4 capsules (800 mg total) by mouth 2 (two) times daily for 5 days., Disp: 40 capsule, Rfl: 0   Multiple Vitamin (MULTIVITAMIN WITH MINERALS) TABS tablet, Take 1 tablet by mouth daily., Disp: , Rfl:    pantoprazole (PROTONIX) 20 MG tablet, TAKE ONE TABLET BY MOUTH DAILY, Disp: 90 tablet, Rfl: 3   potassium chloride SA (KLOR-CON M20) 20 MEQ tablet, Take 1 tablet (20 mEq total) by mouth every other day., Disp: 46 tablet, Rfl: 3   predniSONE (DELTASONE) 20 MG tablet, Take 2 tablets (40 mg total) by mouth daily., Disp: 10 tablet, Rfl: 0   Propylene Glycol (SYSTANE BALANCE OP), Place 1 drop into both eyes every 6 (six) hours as needed (dry eyes)., Disp: , Rfl:    rosuvastatin (CRESTOR) 20 MG tablet, TAKE ONE TABLET BY MOUTH DAILY, Disp: 90 tablet, Rfl: 3   tamoxifen (NOLVADEX) 20 MG tablet, TAKE ONE TABLET BY MOUTH DAILY, Disp: 30 tablet, Rfl: 6   traZODone (DESYREL) 50 MG tablet, TAKE 0.5-1 TABLETS (25-50 MG TOTAL) BY MOUTH AT BEDTIME AS NEEDED FOR SLEEP., Disp: 30 tablet, Rfl: 3   Turmeric (QC TUMERIC COMPLEX PO), Take by mouth., Disp: , Rfl:    Vitamin D, Cholecalciferol, 1000 units CAPS, Take 1,000 Units by mouth daily. , Disp: , Rfl:   Allergies  Allergen Reactions   Alcohol Other (See Comments)    Recovering Alcoholic*   Codeine     Patient prefers not to take this due to history of alcoholism   Dilaudid [Hydromorphone Hcl]     Pt had hypotension after dilaudid 1mg  IV while in the OR, required temporary pressor intervention.    Objective:   VITALS: Per patient if applicable, see vitals. GENERAL: Alert, appears well and in no acute distress. HEENT: Atraumatic, conjunctiva clear, no obvious abnormalities on inspection of external nose and ears. NECK: Normal movements of the head and  neck. CARDIOPULMONARY: No increased WOB. Speaking in clear sentences. I:E ratio WNL.  MS: Moves all visible extremities without noticeable abnormality. PSYCH: Pleasant and cooperative, well-groomed. Speech normal rate and rhythm. Affect is appropriate. Insight and judgement are appropriate. Attention is focused, linear, and appropriate.  NEURO: CN grossly intact. Oriented as arrived to appointment on time with no prompting. Moves both UE equally.  SKIN: No obvious lesions, wounds, erythema, or cyanosis noted on face or hands.  Assessment and Plan:   Cyntha was seen today for covid positive.  Diagnoses and all orders for this visit:  COVID-19  Simple chronic bronchitis ([place])  Other orders -     predniSONE (DELTASONE) 20 MG tablet; Take 2 tablets (40 mg total) by mouth daily. -     molnupiravir EUA 200 mg CAPS; Take 4 capsules (800 mg total) by mouth 2 (two) times daily for 5 days.   No red flags on discussion, patient is not in any obvious  distress during our visit.  Patient is interested in [place]. I have sent this in for patient and also discussed that this medication is still under the FDA EUA and full long-term side effect profile is unknown.  Benefits/risks of medication discussed.  Patient currently has no contraindications for taking this medication.  They are aware of risks of medication and wishes to proceed.  I am also starting oral prednisone given current COPD diagnosis.  Discussed over the counter supportive care options, including Tylenol 500 mg q 8 hours, with recommendations to push fluids and rest. Reviewed return precautions including new/worsening fever, SOB, new/worsening cough, sudden onset changes of symptoms. Recommended need to self-quarantine and practice social distancing until symptoms resolve. I recommend that patient follow-up if symptoms worsen or persist despite treatment x 7-10 days, sooner if needed.  I discussed the assessment and treatment  plan with the patient. The patient was provided an opportunity to ask questions and all were answered. The patient agreed with the plan and demonstrated an understanding of the instructions.   The patient was advised to call back or seek an in-person evaluation if the symptoms worsen or if the condition fails to improve as anticipated.   CMA or LPN served as scribe during this visit. History, Physical, and Plan performed by medical provider. The above documentation has been reviewed and is accurate and complete.  [place], [place] 02/22/2021

## 2021-02-23 ENCOUNTER — Inpatient Hospital Stay: Payer: Medicare HMO | Admitting: Hematology

## 2021-02-23 ENCOUNTER — Inpatient Hospital Stay: Payer: Medicare HMO

## 2021-02-24 ENCOUNTER — Encounter (HOSPITAL_COMMUNITY): Payer: Self-pay

## 2021-02-24 ENCOUNTER — Ambulatory Visit (HOSPITAL_COMMUNITY): Payer: Medicare HMO

## 2021-02-28 ENCOUNTER — Encounter: Payer: Self-pay | Admitting: Physician Assistant

## 2021-03-01 NOTE — Telephone Encounter (Signed)
Spoke to pt asked her how she is feeling this morning? Pt said she is feeling a little better today, she slept for 2 days. Asked pt how her breathing is, has it gotten worse? Pt says breathing is the same, has not had to use inhaler. Pulse ox is 91% now. Does not feel SOB, no dizziness but has a foggy feeling but better today. Pt has completed medications that were prescribed for COVID. Told pt to continue to monitor symptoms if pulse ox goes below 90, or increase in SOB or dizziness please go to ED. Pt verbalized understanding.

## 2021-03-01 NOTE — Telephone Encounter (Signed)
Please see message. °

## 2021-03-04 ENCOUNTER — Ambulatory Visit: Payer: Medicare HMO | Admitting: Family Medicine

## 2021-03-07 DIAGNOSIS — M13842 Other specified arthritis, left hand: Secondary | ICD-10-CM | POA: Diagnosis not present

## 2021-03-07 DIAGNOSIS — M13841 Other specified arthritis, right hand: Secondary | ICD-10-CM | POA: Diagnosis not present

## 2021-03-07 DIAGNOSIS — M79641 Pain in right hand: Secondary | ICD-10-CM | POA: Diagnosis not present

## 2021-03-07 DIAGNOSIS — Z20822 Contact with and (suspected) exposure to covid-19: Secondary | ICD-10-CM | POA: Diagnosis not present

## 2021-03-07 DIAGNOSIS — M65841 Other synovitis and tenosynovitis, right hand: Secondary | ICD-10-CM | POA: Diagnosis not present

## 2021-03-08 ENCOUNTER — Encounter: Payer: Self-pay | Admitting: Physician Assistant

## 2021-03-15 ENCOUNTER — Other Ambulatory Visit: Payer: Self-pay | Admitting: Nurse Practitioner

## 2021-03-15 DIAGNOSIS — Z170 Estrogen receptor positive status [ER+]: Secondary | ICD-10-CM

## 2021-03-15 DIAGNOSIS — C50412 Malignant neoplasm of upper-outer quadrant of left female breast: Secondary | ICD-10-CM

## 2021-03-16 ENCOUNTER — Inpatient Hospital Stay: Payer: Medicare HMO | Attending: Nurse Practitioner

## 2021-03-16 ENCOUNTER — Other Ambulatory Visit: Payer: Self-pay

## 2021-03-16 ENCOUNTER — Inpatient Hospital Stay: Payer: Medicare HMO | Admitting: Hematology

## 2021-03-16 ENCOUNTER — Encounter: Payer: Self-pay | Admitting: Hematology

## 2021-03-16 ENCOUNTER — Other Ambulatory Visit (HOSPITAL_COMMUNITY): Payer: Self-pay | Admitting: Physician Assistant

## 2021-03-16 VITALS — BP 160/61 | HR 73 | Temp 98.1°F | Resp 18 | Wt 188.0 lb

## 2021-03-16 DIAGNOSIS — C50412 Malignant neoplasm of upper-outer quadrant of left female breast: Secondary | ICD-10-CM

## 2021-03-16 DIAGNOSIS — M1990 Unspecified osteoarthritis, unspecified site: Secondary | ICD-10-CM | POA: Diagnosis not present

## 2021-03-16 DIAGNOSIS — M419 Scoliosis, unspecified: Secondary | ICD-10-CM | POA: Diagnosis not present

## 2021-03-16 DIAGNOSIS — I10 Essential (primary) hypertension: Secondary | ICD-10-CM | POA: Diagnosis not present

## 2021-03-16 DIAGNOSIS — E785 Hyperlipidemia, unspecified: Secondary | ICD-10-CM | POA: Insufficient documentation

## 2021-03-16 DIAGNOSIS — K219 Gastro-esophageal reflux disease without esophagitis: Secondary | ICD-10-CM | POA: Diagnosis not present

## 2021-03-16 DIAGNOSIS — Z8673 Personal history of transient ischemic attack (TIA), and cerebral infarction without residual deficits: Secondary | ICD-10-CM | POA: Insufficient documentation

## 2021-03-16 DIAGNOSIS — Z79810 Long term (current) use of selective estrogen receptor modulators (SERMs): Secondary | ICD-10-CM | POA: Insufficient documentation

## 2021-03-16 DIAGNOSIS — Z87891 Personal history of nicotine dependence: Secondary | ICD-10-CM | POA: Diagnosis not present

## 2021-03-16 DIAGNOSIS — J449 Chronic obstructive pulmonary disease, unspecified: Secondary | ICD-10-CM | POA: Diagnosis not present

## 2021-03-16 DIAGNOSIS — Z170 Estrogen receptor positive status [ER+]: Secondary | ICD-10-CM | POA: Insufficient documentation

## 2021-03-16 DIAGNOSIS — F1021 Alcohol dependence, in remission: Secondary | ICD-10-CM | POA: Diagnosis not present

## 2021-03-16 DIAGNOSIS — M19041 Primary osteoarthritis, right hand: Secondary | ICD-10-CM | POA: Diagnosis not present

## 2021-03-16 DIAGNOSIS — M19042 Primary osteoarthritis, left hand: Secondary | ICD-10-CM | POA: Insufficient documentation

## 2021-03-16 LAB — CBC WITH DIFFERENTIAL (CANCER CENTER ONLY)
Abs Immature Granulocytes: 0.02 10*3/uL (ref 0.00–0.07)
Basophils Absolute: 0 10*3/uL (ref 0.0–0.1)
Basophils Relative: 0 %
Eosinophils Absolute: 0.2 10*3/uL (ref 0.0–0.5)
Eosinophils Relative: 3 %
HCT: 42.1 % (ref 36.0–46.0)
Hemoglobin: 14.1 g/dL (ref 12.0–15.0)
Immature Granulocytes: 0 %
Lymphocytes Relative: 14 %
Lymphs Abs: 1.1 10*3/uL (ref 0.7–4.0)
MCH: 31.7 pg (ref 26.0–34.0)
MCHC: 33.5 g/dL (ref 30.0–36.0)
MCV: 94.6 fL (ref 80.0–100.0)
Monocytes Absolute: 0.7 10*3/uL (ref 0.1–1.0)
Monocytes Relative: 9 %
Neutro Abs: 5.7 10*3/uL (ref 1.7–7.7)
Neutrophils Relative %: 74 %
Platelet Count: 224 10*3/uL (ref 150–400)
RBC: 4.45 MIL/uL (ref 3.87–5.11)
RDW: 13.2 % (ref 11.5–15.5)
WBC Count: 7.9 10*3/uL (ref 4.0–10.5)
nRBC: 0 % (ref 0.0–0.2)

## 2021-03-16 LAB — CMP (CANCER CENTER ONLY)
ALT: 11 U/L (ref 0–44)
AST: 15 U/L (ref 15–41)
Albumin: 3.6 g/dL (ref 3.5–5.0)
Alkaline Phosphatase: 54 U/L (ref 38–126)
Anion gap: 9 (ref 5–15)
BUN: 15 mg/dL (ref 8–23)
CO2: 29 mmol/L (ref 22–32)
Calcium: 9.6 mg/dL (ref 8.9–10.3)
Chloride: 105 mmol/L (ref 98–111)
Creatinine: 0.93 mg/dL (ref 0.44–1.00)
GFR, Estimated: >60 mL/min (ref >60)
Glucose, Bld: 109 mg/dL — ABNORMAL HIGH (ref 70–99)
Potassium: 3.9 mmol/L (ref 3.5–5.1)
Sodium: 143 mmol/L (ref 135–145)
Total Bilirubin: 0.6 mg/dL (ref 0.3–1.2)
Total Protein: 6.2 g/dL — ABNORMAL LOW (ref 6.5–8.1)

## 2021-03-16 NOTE — Progress Notes (Signed)
[place]   Telephone:([phone]) [phone] Fax:(336) [phone]   Clinic Follow up Note   Patient Care Team: Marin Olp, MD as PCP - General (Family Medicine) Pieter Partridge, DO as Consulting Physician (Neurology) Collene Gobble, MD as Consulting Physician (Pulmonary Disease) Edna, P.A. as Consulting Physician Jarome Matin, MD as Consulting Physician (Dermatology) Luanne Bras, MD as Consulting Physician (Interventional Radiology) Mauro Kaufmann, RN as Oncology Nurse Navigator Rockwell Germany, RN as Oncology Nurse Navigator Truitt Merle, MD as Consulting Physician (Hematology) [name], MD as Consulting Physician (General Surgery) Kyung Rudd, MD as Consulting Physician (Radiation Oncology) Alla Feeling, NP as Nurse Practitioner (Nurse Practitioner)  Date of Service:  03/16/2021  CHIEF COMPLAINT: f/u of left breast cancer  CURRENT THERAPY:  Tamoxifen 44m once daily starting 06/2020   ASSESSMENT & PLAN:  Sheila LICCIARDIis a 86y.o. female with   1. Malignant neoplasm of upper-outer quadrant of left breast, Stage IA, pT1cN0M0, lobular carcinoma, ER+/PR+/HER2-, Grade II -She was diagnosed in 02/2020 with a 684mmass in her left breast with invasive lobular carcinoma with components of LCIS.  -She underwent left lumpectomy with Dr ByBarry Dienesn 03/23/20. Surgical path showed her 1.1cm mass was completely resected with clear margins, node negative. Given early stage cancer and advanced age, chemo was not recommended.  -she completed adjuvant RT  -She started tamoxifen in 06/2020. She is tolerating well. -most recent mammogram on 02/08/21 was benign. -She is clinically doing well. Labs reviewed, CBC WNL, CMP pending. Her physical exam was unremarkable. There is no clinical concern for recurrence. -labs and f/u with NP Lacie in 6 months  2. Bone Health, Cancer Screening -Baseline DEXA in 01/2015 was +0.7. Recent repeat 02/08/21 remains normal at  +0.4.   2. Comorbidities: Brain Aneurysm, HTN, HLD, GERD, COPD, Osteoarthritis -Her brain aneurysm was banded in 12/2018. She continues to f/u with Dr DeVickey SagesShe is on 32517mspirin.  -She did have very mild TIA and is seen by Dr JafTana Feltshe is also on half tablet Plavix due to easy bruising.  -On HCTZ, Protonix, Crestor. Her COPD is well controlled.  -She has OA in her hands and back along with herniated disc, scoliosis and spinal stenosis. This is controlled on Tylenol.   3. H/o of alcohol and nicotine use.  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 She was also a heavy smoker and quit smoking in 1996.     PLAN:  -continue tamoxifen -labs and f/u with NP Lacie in 6 months   No problem-specific Assessment & Plan notes found for this encounter.   SUMMARY OF ONCOLOGIC HISTORY: Oncology History Overview Note  Cancer Staging Malignant neoplasm of upper-outer quadrant of left breast in female, estrogen receptor positive (HCCFort Pierce Northtaging form: Breast, AJCC 8th Edition - Clinical stage from 02/09/2020: Stage IA (cT1b, cN0, cM0, G2, ER+, PR+, HER2-) - Signed by FenTruitt MerleD on 02/17/2020 Stage prefix: Initial diagnosis Histologic grading system: 3 grade system - Pathologic stage from 03/23/2020: Stage Unknown (pT1c, pNX, cM0, G2, ER+, PR+, HER2-) - Signed by FenTruitt MerleD on 05/25/2020 Stage prefix: Initial diagnosis Multigene prognostic tests performed: None Histologic grading system: 3 grade system    Malignant neoplasm of upper-outer quadrant of left breast in female, estrogen receptor positive (HCCMarlboro Meadows7/23/2021 Mammogram   IMPRESSION: 0.6x0.4x0.6cm suspicious mass 12 o'clock axis left breast, 3 cm from the nipple.    02/09/2020 Receptors her2   PROGNOSTIC INDICATORS Results:  IMMUNOHISTOCHEMICAL AND MORPHOMETRIC ANALYSIS PERFORMED MANUALLY The tumor cells are NEGATIVE for Her2 (1+). Estrogen Receptor: 95%, POSITIVE, STRONG STAINING INTENSITY Progesterone Receptor: 90%,  POSITIVE, MODERATE STAINING INTENSITY Proliferation Marker Ki67: 15%   02/09/2020 Cancer Staging   Staging form: Breast, AJCC 8th Edition - Clinical stage from 02/09/2020: Stage IA (cT1b, cN0, cM0, G2, ER+, PR+, HER2-) - Signed by Truitt Merle, MD on 02/17/2020   02/12/2020 Initial Diagnosis   Malignant neoplasm of upper-outer quadrant of left breast in female, estrogen receptor positive ([place])   02/16/2020 Initial Biopsy   Diagnosis Breast, left, needle core biopsy, 12:30, 3 cmfn - INVASIVE MAMMARY CARCINOMA, SEE COMMENT. - MAMMARY CARCINOMA IN SITU. Microscopic Comment The carcinoma appears grade 2 and measures 11 mm in greatest linear extent. E-cadherin will be ordered. Prognostic makers will be ordered. Dr. Saralyn Pilar has reviewed the case. The case was called to [facility] on 02/10/2020.   03/23/2020 Surgery   LEFT BREAST LUMPECTOMY WITH RADIOACTIVE SEED LOCALIZATION by Dr Barry Dienes     03/23/2020 Pathology Results   FINAL MICROSCOPIC DIAGNOSIS:   A. BREAST, LEFT, LUMPECTOMY:  - Invasive lobular carcinoma, 1.1 cm, Nottingham grade 2 of 3.  - Invasive carcinoma focally involves the medial margin.  - Biopsy clip site.  - See oncology table.   B. BREAST, LEFT, ADDITIONAL MEDIAL MARGIN, EXCISION:  - Breast tissue; no invasive carcinoma identified.   C. BREAST, LEFT, ADDITIONAL SUPERIOR MARGIN, EXCISION:  - Breast tissue; no invasive carcinoma identified.   D. BREAST, LEFT, ADDITIONAL POSTERIOR MARGIN, EXCISION:  - Breast tissue; no invasive carcinoma identified.    03/23/2020 Cancer Staging   Staging form: Breast, AJCC 8th Edition - Pathologic stage from 03/23/2020: Stage Unknown (pT1c, pNX, cM0, G2, ER+, PR+, HER2-) - Signed by Truitt Merle, MD on 05/25/2020   04/29/2020 - 05/26/2020 Radiation Therapy   Adjuvant Radiation with [name] 04/29/20-05/26/20    06/2020 -  Anti-estrogen oral therapy   Tamoxifen 72m once daily starting 06/2020   08/30/2020 Survivorship   SCP  done virtually by [name] NP. Care plan sent to patient via Mychart       INTERVAL HISTORY:  Sheila BOCHis here for a follow up of breast cancer. She was last seen by me on 05/26/20 and by NP Lacie in the interim. She presents to the clinic alone. She reports she is feeling very well "for an old lady!" [place] She reports their house sold in 3 hours. She plans to have a "packing party." She notes she fell down the stairs in her house over the summer. She reports she fractured her ankle and had to undergo 4 hours of surgery to repair it. She notes she now has plates and screws in place. She is wearing a compression brace because it "makes me feel safe."   All other systems were reviewed with the patient and are negative.  MEDICAL HISTORY:  Past Medical History:  Diagnosis Date   Alcoholism in remission ([place] 08/27/2007   Anemia    Arthritis    OA   Breast cancer ([place]    left breast   COPD 12/17/2006   Diverticulosis 03/27/2007   GERD 05/01/2007   HYPERGLYCEMIA 08/27/2007   HYPERLIPIDEMIA 12/17/2006   HYPERTENSION 12/17/2006   Mood disorder ([place]    hx anxiety and depression. meds short term during life transition   Pneumonia    AS A CHILD  Rectal polyp 03/27/2007   adenoma   SOB (shortness of breath) on exertion    per patient due to having COPD   TIA (transient ischemic attack)     SURGICAL HISTORY: Past Surgical History:  Procedure Laterality Date   ABDOMINAL HYSTERECTOMY  1978   fibroma   APPENDECTOMY  2002   BREAST EXCISIONAL BIOPSY Right 2003   negative   BREAST LUMPECTOMY WITH RADIOACTIVE SEED LOCALIZATION Left 03/23/2020   Procedure: LEFT BREAST LUMPECTOMY WITH RADIOACTIVE SEED LOCALIZATION;  Surgeon: [name], MD;  Location: [facility];  Service: General;  Laterality: Left;  RNFA   CARPAL TUNNEL RELEASE Left 2010 or 2011   CATARACT EXTRACTION W/ INTRAOCULAR LENS  IMPLANT, BILATERAL  Bilateral    COLONOSCOPY W/ POLYPECTOMY  03/27/2007; n7/19/2012   2008: 4 mm adenoma, diverticulosis 2012: ileitis ? NSAID - likely, 2-3 mm cecal polyp LYMPHOID FOLLICLE, diverticulosis   ESOPHAGOGASTRODUODENOSCOPY  01/26/2011   reflux esophagitis, colonscopy done also   HAMMER TOE SURGERY  2008   left foot   HERNIA REPAIR Right 1996?   IR ANGIO INTRA EXTRACRAN SEL COM CAROTID INNOMINATE UNI R MOD SED  11/28/2018   IR ANGIO INTRA EXTRACRAN SEL INTERNAL CAROTID UNI R MOD SED  12/30/2018   IR ANGIO VERTEBRAL SEL SUBCLAVIAN INNOMINATE UNI R MOD SED  11/28/2018   IR ANGIOGRAM FOLLOW UP STUDY  12/30/2018   IR TRANSCATH/EMBOLIZ  12/30/2018   IR US GUIDE VASC ACCESS RIGHT  11/28/2018   ORIF ANKLE FRACTURE Right 12/22/2014   Procedure: OPEN REDUCTION INTERNAL FIXATION (ORIF) ANKLE FRACTURE;  Surgeon: Susa Day, MD;  Location: [facility];  Service: Orthopedics;  Laterality: Right;   RADIOLOGY WITH ANESTHESIA N/A 12/30/2018   Procedure: RADIOLOGY WITH ANESTHESIA;  Surgeon: Luanne Bras, MD;  Location: [facility];  Service: Radiology;  Laterality: N/A;   SHOULDER OPEN ROTATOR CUFF REPAIR Left 03/06/2013   Procedure: LEFT ROTATOR CUFF REPAIR, SUBACROMIAL DECOMPRESSION, PATCH GRAFT, MANIPULATION UNDER ANESTHESIA;  Surgeon: Johnn Hai, MD;  Location: [facility];  Service: Orthopedics;  Laterality: Left;   TONSILLECTOMY      I have reviewed the social history and family history with the patient and they are unchanged from previous note.  ALLERGIES:  is allergic to alcohol, codeine, and dilaudid [hydromorphone hcl].  MEDICATIONS:  Current Outpatient Medications  Medication Sig Dispense Refill   acetaminophen (TYLENOL) 650 MG CR tablet Take 1,300 mg by mouth 2 (two) times daily.     albuterol (PROAIR HFA) 108 (90 Base) MCG/ACT inhaler INHALE 2 PUFFS EVERY 4 HOURS AS NEEDED FOR COUGHING SPELLS 18 g 5   aspirin 325 MG tablet Take 1 tablet (325 mg total) by mouth daily. 30 tablet 3   clopidogrel (PLAVIX) 75 MG  tablet Take 0.5 tablets (37.5 mg total) by mouth daily. 46 tablet 3   Coenzyme Q10 (COQ-10 PO) Take 1 capsule by mouth daily.     Cyanocobalamin (VITAMIN B-12 PO) Take 1 tablet by mouth daily.     diclofenac Sodium (VOLTAREN) 1 % GEL Apply 2 g topically 4 (four) times daily. (Patient taking differently: Apply 2 g topically 4 (four) times daily as needed (pain).) 100 g 11   ferrous sulfate 325 (65 FE) MG tablet TAKE 1 TABLET BY MOUTH EVERY DAY (Patient not taking: No sig reported) 90 tablet 3   fexofenadine (ALLEGRA) 180 MG tablet Take 180 mg by mouth daily.     fluticasone (FLONASE) 50 MCG/ACT nasal spray Place 2 sprays into both nostrils daily. 16 g  2   hydrochlorothiazide (HYDRODIURIL) 25 MG tablet Take 0.5 tablets (12.5 mg total) by mouth daily. 46 tablet 3   Multiple Vitamin (MULTIVITAMIN WITH MINERALS) TABS tablet Take 1 tablet by mouth daily.     pantoprazole (PROTONIX) 20 MG tablet TAKE ONE TABLET BY MOUTH DAILY 90 tablet 3   potassium chloride SA (KLOR-CON M20) 20 MEQ tablet Take 1 tablet (20 mEq total) by mouth every other day. 46 tablet 3   predniSONE (DELTASONE) 20 MG tablet Take 2 tablets (40 mg total) by mouth daily. (Patient not taking: No sig reported) 10 tablet 0   Propylene Glycol (SYSTANE BALANCE OP) Place 1 drop into both eyes every 6 (six) hours as needed (dry eyes).     rosuvastatin (CRESTOR) 20 MG tablet TAKE ONE TABLET BY MOUTH DAILY 90 tablet 3   tamoxifen (NOLVADEX) 20 MG tablet TAKE ONE TABLET BY MOUTH DAILY 30 tablet 6   traMADol (ULTRAM) 50 MG tablet TAKE ONE TABLET BY MOUTH EVERY 8 HOURS AS NEEDED FOR PAIN FOR UP TO 5 DAYS (DO NOT DRIVE FOR 8 HOURS AFTER TAKING) 15 tablet 0   traZODone (DESYREL) 50 MG tablet TAKE 0.5-1 TABLETS (25-50 MG TOTAL) BY MOUTH AT BEDTIME AS NEEDED FOR SLEEP. (Patient taking differently: Take 50 mg by mouth at bedtime.) 30 tablet 3   Turmeric (QC TUMERIC COMPLEX PO) Take 1 tablet by mouth daily.     VITAMIN D PO Take 1 capsule by mouth daily.      No current facility-administered medications for this visit.    PHYSICAL EXAMINATION: ECOG PERFORMANCE STATUS: 0 - Asymptomatic  Vitals:   03/16/21 1313  BP: (!) 160/61  Pulse: 73  Resp: 18  Temp: 98.1 F (36.7 C)  SpO2: 90%   Wt Readings from Last 3 Encounters:  03/16/21 188 lb (85.3 kg)  02/22/21 185 lb (83.9 kg)  02/07/21 191 lb (86.6 kg)     GENERAL:alert, no distress and comfortable SKIN: skin color, texture, turgor are normal, no rashes or significant lesions EYES: normal, Conjunctiva are pink and non-injected, sclera clear NECK: supple, thyroid normal size, non-tender, without nodularity LYMPH:  no palpable lymphadenopathy in the cervical, axillary  LUNGS: clear to auscultation and percussion with normal breathing effort HEART: regular rate & rhythm and no murmurs and no lower extremity edema ABDOMEN:abdomen soft, non-tender and normal bowel sounds Musculoskeletal:no cyanosis of digits and no clubbing  NEURO: alert & oriented x 3 with fluent speech, no focal motor/sensory deficits BREAST: small seroma at 12 o'clock in left breast. No palpable mass, nodules or adenopathy bilaterally. Breast exam benign.   LABORATORY DATA:  I have reviewed the data as listed CBC Latest Ref Rng & Units 03/16/2021 11/23/2020 05/24/2020  WBC 4.0 - 10.5 K/uL 7.9 7.6 8.3  Hemoglobin 12.0 - 15.0 g/dL 14.1 14.5 14.8  Hematocrit 36.0 - 46.0 % 42.1 42.2 43.5  Platelets 150 - 400 K/uL 224 230.0 261     CMP Latest Ref Rng & Units 03/16/2021 11/23/2020 05/24/2020  Glucose 70 - 99 mg/dL 109(H) 120(H) 122(H)  BUN 8 - 23 mg/dL _0 Creatinine 0.44 - 1.00 mg/dL 0.93 0.73 0.76  Sodium 135 - 145 mmol/L 143 143 142  Potassium 3.5 - 5.1 mmol/L 3.9 3.5 3.5  Chloride 98 - 111 mmol/L 105 105 105  CO2 22 - 32 mmol/L _1 Calcium 8.9 - 10.3 mg/dL 9.6 9.3 9.9  Total Protein 6.5 - 8.1 g/dL 6.2(L) 6.3 6.1  Total Bilirubin 0.3 -  1.2 mg/dL 0.6 0.5 0.6  Alkaline Phos 38 - 126 U/L 54 54 -  AST 15  - 41 U/L _0 ALT 0 - 44 U/L _1 RADIOGRAPHIC STUDIES: I have personally reviewed the radiological images as listed and agreed with the findings in the report. No results found.    No orders of the defined types were placed in this encounter.  All questions were answered. The patient knows to call the clinic with any problems, questions or concerns. No barriers to learning was detected. The total time spent in the appointment was 25 minutes.     Truitt Merle, MD 03/16/2021   I, Wilburn Mylar, am acting as scribe for Truitt Merle, MD.   I have reviewed the above documentation for accuracy and completeness, and I agree with the above.

## 2021-03-17 ENCOUNTER — Other Ambulatory Visit: Payer: Self-pay

## 2021-03-17 ENCOUNTER — Other Ambulatory Visit (HOSPITAL_COMMUNITY): Payer: Self-pay | Admitting: Interventional Radiology

## 2021-03-17 ENCOUNTER — Ambulatory Visit (HOSPITAL_COMMUNITY)
Admission: RE | Admit: 2021-03-17 | Discharge: 2021-03-17 | Disposition: A | Discharge status: Home / Self Care | Payer: Medicare HMO | Source: Ambulatory Visit | Attending: Interventional Radiology | Admitting: Interventional Radiology

## 2021-03-17 DIAGNOSIS — Z885 Allergy status to narcotic agent status: Secondary | ICD-10-CM | POA: Diagnosis not present

## 2021-03-17 DIAGNOSIS — I671 Cerebral aneurysm, nonruptured: Secondary | ICD-10-CM | POA: Diagnosis not present

## 2021-03-17 DIAGNOSIS — Z7982 Long term (current) use of aspirin: Secondary | ICD-10-CM | POA: Insufficient documentation

## 2021-03-17 DIAGNOSIS — Z91048 Other nonmedicinal substance allergy status: Secondary | ICD-10-CM | POA: Diagnosis not present

## 2021-03-17 DIAGNOSIS — Z79899 Other long term (current) drug therapy: Secondary | ICD-10-CM | POA: Insufficient documentation

## 2021-03-17 DIAGNOSIS — Z7902 Long term (current) use of antithrombotics/antiplatelets: Secondary | ICD-10-CM | POA: Insufficient documentation

## 2021-03-17 DIAGNOSIS — Z87891 Personal history of nicotine dependence: Secondary | ICD-10-CM | POA: Insufficient documentation

## 2021-03-17 HISTORY — PX: IR US GUIDE VASC ACCESS RIGHT: IMG2390

## 2021-03-17 HISTORY — PX: IR ANGIO INTRA EXTRACRAN SEL COM CAROTID INNOMINATE UNI R MOD SED: IMG5359

## 2021-03-17 LAB — BASIC METABOLIC PANEL
Anion gap: 8 (ref 5–15)
BUN: 16 mg/dL (ref 8–23)
CO2: 28 mmol/L (ref 22–32)
Calcium: 9 mg/dL (ref 8.9–10.3)
Chloride: 104 mmol/L (ref 98–111)
Creatinine, Ser: 0.85 mg/dL (ref 0.44–1.00)
GFR, Estimated: >60 mL/min (ref >60)
Glucose, Bld: 96 mg/dL (ref 70–99)
Potassium: 3.7 mmol/L (ref 3.5–5.1)
Sodium: 140 mmol/L (ref 135–145)

## 2021-03-17 LAB — CBC
HCT: 41 % (ref 36.0–46.0)
Hemoglobin: 13.8 g/dL (ref 12.0–15.0)
MCH: 32.1 pg (ref 26.0–34.0)
MCHC: 33.7 g/dL (ref 30.0–36.0)
MCV: 95.3 fL (ref 80.0–100.0)
Platelets: 204 10*3/uL (ref 150–400)
RBC: 4.3 MIL/uL (ref 3.87–5.11)
RDW: 13.2 % (ref 11.5–15.5)
WBC: 5.4 10*3/uL (ref 4.0–10.5)
nRBC: 0 % (ref 0.0–0.2)

## 2021-03-17 LAB — PROTIME-INR
INR: 1 (ref 0.8–1.2)
Prothrombin Time: 13 seconds (ref 11.4–15.2)

## 2021-03-17 MED ORDER — MIDAZOLAM HCL 2 MG/2ML IJ SOLN
INTRAMUSCULAR | Status: AC
  Filled 2021-03-17: qty 2

## 2021-03-17 MED ORDER — IOHEXOL 350 MG/ML SOLN
100.0000 mL | Freq: Once | INTRAVENOUS | Status: AC | PRN
  Administered 2021-03-17: 50 mL via INTRA_ARTERIAL

## 2021-03-17 MED ORDER — FENTANYL CITRATE (PF) 100 MCG/2ML IJ SOLN
INTRAMUSCULAR | Status: AC
  Filled 2021-03-17: qty 2

## 2021-03-17 MED ORDER — VERAPAMIL HCL 2.5 MG/ML IV SOLN: INTRA_ARTERIAL | Status: DC | PRN

## 2021-03-17 MED ORDER — HEPARIN SODIUM (PORCINE) 1000 UNIT/ML IJ SOLN
INTRAMUSCULAR | Status: AC
  Filled 2021-03-17: qty 1

## 2021-03-17 MED ORDER — LIDOCAINE HCL 1 % IJ SOLN
INTRAMUSCULAR | Status: AC
  Filled 2021-03-17: qty 20

## 2021-03-17 MED ORDER — FENTANYL CITRATE (PF) 100 MCG/2ML IJ SOLN
INTRAMUSCULAR | Status: DC | PRN
  Administered 2021-03-17: 25 ug via INTRAVENOUS

## 2021-03-17 MED ORDER — NITROGLYCERIN 1 MG/10 ML FOR IR/CATH LAB
INTRA_ARTERIAL | Status: DC | PRN
  Administered 2021-03-17: 200 ug via INTRA_ARTERIAL

## 2021-03-17 MED ORDER — MIDAZOLAM HCL 2 MG/2ML IJ SOLN
INTRAMUSCULAR | Status: DC | PRN
  Administered 2021-03-17: 1 mg via INTRAVENOUS

## 2021-03-17 MED ORDER — NITROGLYCERIN 1 MG/10 ML FOR IR/CATH LAB
INTRA_ARTERIAL | Status: AC
  Filled 2021-03-17: qty 10

## 2021-03-17 MED ORDER — SODIUM CHLORIDE 0.9 % IV SOLN: INTRAVENOUS | Status: DC

## 2021-03-17 MED ORDER — LIDOCAINE HCL (PF) 1 % IJ SOLN
INTRAMUSCULAR | Status: DC | PRN
  Administered 2021-03-17: 5 mL via SUBCUTANEOUS

## 2021-03-17 MED ORDER — SODIUM CHLORIDE 0.9 % IV SOLN: INTRAVENOUS | Status: AC

## 2021-03-17 MED ORDER — HEPARIN SODIUM (PORCINE) 1000 UNIT/ML IJ SOLN
INTRAMUSCULAR | Status: DC | PRN
  Administered 2021-03-17: 2000 [IU] via INTRAVENOUS

## 2021-03-17 MED ORDER — VERAPAMIL HCL 2.5 MG/ML IV SOLN
INTRAVENOUS | Status: AC
  Filled 2021-03-17: qty 2

## 2021-03-17 NOTE — Sedation Documentation (Signed)
Patient transported to short stay. Right wrist assessed. Clean, dry and intact. TR band in place. No hematoma noted.

## 2021-03-17 NOTE — Procedures (Signed)
S/P RT VA and RT common caorid arteriograms. RT Rad approach. Findings. 1.No recurrence or recanalization n of previously treated RT ICA supraclinoid aneurysm  2. Minimal neointimal  proliferation within the flow diverter construct. 3.Mild stenosis at origin of RT [place].  S.Jolisa Intriago MD

## 2021-03-17 NOTE — Progress Notes (Signed)
Pt ambulated without difficulty or bleeding.   Discharged home with her husband who will drive and stay with pt x 24 hrs. 

## 2021-03-17 NOTE — Sedation Documentation (Signed)
End tidal off at this time, procedure completed.

## 2021-03-17 NOTE — H&P (Signed)
Chief Complaint: Patient was seen in consultation today for Cerebral arteriogram at the request of Dr Liborio Nixon   Supervising Physician: Luanne Bras  Patient Status: [facility] - Out-pt  History of Present Illness: Sheila Shields is a 86 y.o. 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 Status post treatment of the supraclinoid right ICA aneurysm with telescoping flow diverter stents. A 4 mm faint flow related enhancement from the undersurface of the supraclinoid right ICA may represent small residual aneurysm. This was not well seen on prior MRA likely due to artifact. 2. A 4 mm outpouching at the origin of the left posterior communicating artery may represent a prominent infundibulum versus aneurysm, unchanged.   Here today for evaluation of L PCA aneurysm   Past Medical History:  Diagnosis Date   Alcoholism in remission ([place]) 08/27/2007   Anemia    Arthritis    OA   Breast cancer ([place])    left breast   COPD 12/17/2006   Diverticulosis 03/27/2007   GERD 05/01/2007   HYPERGLYCEMIA 08/27/2007   HYPERLIPIDEMIA 12/17/2006   HYPERTENSION 12/17/2006   Mood disorder ([place])    hx anxiety and depression. meds short term during life transition   Pneumonia    AS A CHILD   Rectal polyp 03/27/2007   adenoma   SOB (shortness of breath) on exertion    per patient due to having COPD   TIA (transient ischemic attack)     Past Surgical History:  Procedure Laterality Date   ABDOMINAL HYSTERECTOMY  1978   fibroma   APPENDECTOMY  2002   BREAST EXCISIONAL BIOPSY Right 2003   negative   BREAST LUMPECTOMY WITH RADIOACTIVE SEED LOCALIZATION Left 03/23/2020   Procedure: LEFT BREAST LUMPECTOMY WITH RADIOACTIVE SEED LOCALIZATION;  Surgeon: [name], MD;  Location: [place];   Service: General;  Laterality: Left;  RNFA   CARPAL TUNNEL RELEASE Left 2010 or 2011   CATARACT EXTRACTION W/ INTRAOCULAR LENS  IMPLANT, BILATERAL Bilateral    COLONOSCOPY W/ POLYPECTOMY  03/27/2007; n7/19/2012   2008: 4 mm adenoma, diverticulosis 2012: ileitis ? NSAID - likely, 2-3 mm cecal polyp LYMPHOID FOLLICLE, diverticulosis   ESOPHAGOGASTRODUODENOSCOPY  01/26/2011   reflux esophagitis, colonscopy done also   HAMMER TOE SURGERY  2008   left foot   HERNIA REPAIR Right 1996?   IR ANGIO INTRA EXTRACRAN SEL COM CAROTID INNOMINATE UNI R MOD SED  11/28/2018   IR ANGIO INTRA EXTRACRAN SEL INTERNAL CAROTID UNI R MOD SED  12/30/2018   IR ANGIO VERTEBRAL SEL SUBCLAVIAN INNOMINATE UNI R MOD SED  11/28/2018   IR ANGIOGRAM FOLLOW UP STUDY  12/30/2018   IR TRANSCATH/EMBOLIZ  12/30/2018   IR US GUIDE VASC ACCESS RIGHT  11/28/2018   ORIF ANKLE FRACTURE Right 12/22/2014   Procedure: OPEN REDUCTION INTERNAL FIXATION (ORIF) ANKLE FRACTURE;  Surgeon: Susa Day, MD;  Location: [facility];  Service: Orthopedics;  Laterality: Right;   RADIOLOGY WITH ANESTHESIA N/A 12/30/2018   Procedure: RADIOLOGY WITH ANESTHESIA;  Surgeon: Luanne Bras, MD;  Location: [place];  Service: Radiology;  Laterality: N/A;   SHOULDER OPEN ROTATOR CUFF REPAIR Left 03/06/2013   Procedure: LEFT ROTATOR CUFF REPAIR, SUBACROMIAL DECOMPRESSION, PATCH GRAFT, MANIPULATION UNDER ANESTHESIA;  Surgeon: Johnn Hai, MD;  Location: [facility];  Service: Orthopedics;  Laterality: Left;  TONSILLECTOMY      Allergies: Alcohol, Codeine, and Dilaudid [hydromorphone hcl]  Medications: Prior to Admission medications   Medication Sig Start Date End Date Taking? Authorizing Provider  acetaminophen (TYLENOL) 650 MG CR tablet Take 1,300 mg by mouth 2 (two) times daily.   Yes [provider]  albuterol (PROAIR HFA) 108 (90 Base) MCG/ACT inhaler INHALE 2 PUFFS EVERY 4 HOURS AS NEEDED FOR COUGHING SPELLS 05/24/20  Yes Marin Olp, MD   aspirin 325 MG tablet Take 1 tablet (325 mg total) by mouth daily. 01/01/19  Yes Louk, Alexandra M, PA-C  clopidogrel (PLAVIX) 75 MG tablet Take 0.5 tablets (37.5 mg total) by mouth daily. 02/01/21  Yes Marin Olp, MD  Coenzyme Q10 (COQ-10 PO) Take 1 capsule by mouth daily.   Yes [provider]  Cyanocobalamin (VITAMIN B-12 PO) Take 1 tablet by mouth daily.   Yes [provider]  diclofenac Sodium (VOLTAREN) 1 % GEL Apply 2 g topically 4 (four) times daily. Patient taking differently: Apply 2 g topically 4 (four) times daily as needed (pain). 05/24/20  Yes Marin Olp, MD  fexofenadine (ALLEGRA) 180 MG tablet Take 180 mg by mouth daily.   Yes [provider]  fluticasone (FLONASE) 50 MCG/ACT nasal spray Place 2 sprays into both nostrils daily. 09/24/18  Yes Collene Gobble, MD  hydrochlorothiazide (HYDRODIURIL) 25 MG tablet Take 0.5 tablets (12.5 mg total) by mouth daily. 11/23/20  Yes Marin Olp, MD  Multiple Vitamin (MULTIVITAMIN WITH MINERALS) TABS tablet Take 1 tablet by mouth daily.   Yes [provider]  pantoprazole (PROTONIX) 20 MG tablet TAKE ONE TABLET BY MOUTH DAILY 04/27/20  Yes Marin Olp, MD  potassium chloride SA (KLOR-CON M20) 20 MEQ tablet Take 1 tablet (20 mEq total) by mouth every other day. 11/23/20  Yes Marin Olp, MD  rosuvastatin (CRESTOR) 20 MG tablet TAKE ONE TABLET BY MOUTH DAILY 11/02/20  Yes Marin Olp, MD  tamoxifen (NOLVADEX) 20 MG tablet TAKE ONE TABLET BY MOUTH DAILY 09/21/20  Yes Truitt Merle, MD  traZODone (DESYREL) 50 MG tablet TAKE 0.5-1 TABLETS (25-50 MG TOTAL) BY MOUTH AT BEDTIME AS NEEDED FOR SLEEP. Patient taking differently: Take 50 mg by mouth at bedtime. 02/22/21  Yes Marin Olp, MD  Turmeric (QC TUMERIC COMPLEX PO) Take 1 tablet by mouth daily.   Yes [provider]  VITAMIN D PO Take 1 capsule by mouth daily.   Yes [provider]  ferrous sulfate 325 (65 FE) MG  tablet TAKE 1 TABLET BY MOUTH EVERY DAY Patient not taking: No sig reported 07/26/15   Dutch Quint B, FNP  predniSONE (DELTASONE) 20 MG tablet Take 2 tablets (40 mg total) by mouth daily. Patient not taking: No sig reported 02/22/21   Inda Coke, PA  Propylene Glycol (SYSTANE BALANCE OP) Place 1 drop into both eyes every 6 (six) hours as needed (dry eyes).    [provider]  traMADol (ULTRAM) 50 MG tablet TAKE ONE TABLET BY MOUTH EVERY 8 HOURS AS NEEDED FOR PAIN FOR UP TO 5 DAYS (DO NOT DRIVE FOR 8 HOURS AFTER TAKING) 02/22/21   Marin Olp, MD     Family History  Problem Relation Age of Onset   Throat cancer Mother        51   Heart attack Father 110   Idiopathic pulmonary fibrosis Brother 27   Cancer Brother        sarcoma  Heart disease Brother    Colon cancer Neg Hx    Esophageal cancer Neg Hx    Stomach cancer Neg Hx     Social History   Socioeconomic History   Marital status: Married    Spouse name: Not on file   Number of children: 2   Years of education: Not on file   Highest education level: Not on file  Occupational History   Occupation: retired  Tobacco Use   Smoking status: Former    Packs/day: 2.00    Years: 30.00    Pack years: 60.00    Types: Cigarettes    Quit date: 07/10/1984    Years since quitting: 36.7   Smokeless tobacco: Never  Vaping Use   Vaping Use: Never used  Substance and Sexual Activity   Alcohol use: No    Alcohol/week: 0.0 standard drinks    Comment: no alcoho since 1983   Drug use: No   Sexual activity: Not on file  Other Topics Concern   Not on file  Social History Narrative   Home Situation: lives with husband. 2 daughters, 2 step daughters, 6 grandkids. 2 greatgrandkids    Work or School: very active in [employer] and church      Spiritual Beliefs: Christian      Lifestyle:tries to walk; diet ok - denies any alcohol or tobacco use in many many years      Caffeine 1 cup coffee/day      Hobbies: news junkie,  audio books      Left Handed      Lives in one story home   Social Determinants of Health   Financial Resource Strain: Low Risk    Difficulty of Paying Living Expenses: Not hard at all  Food Insecurity: No Food Insecurity   Worried About Charity fundraiser in the Last Year: Never true   Arboriculturist in the Last Year: Never true  Transportation Needs: No Transportation Needs   Lack of Transportation (Medical): No   Lack of Transportation (Non-Medical): No  Physical Activity: Inactive   Days of Exercise per Week: 0 days   Minutes of Exercise per Session: 0 min  Stress: Stress Concern Present   Feeling of Stress : To some extent  Social Connections: Engineer, building services of Communication with Friends and Family: More than three times a week   Frequency of Social Gatherings with Friends and Family: More than three times a week   Attends Religious Services: More than 4 times per year   Active Member of [employer] or Organizations: Yes   Attends Archivist Meetings: 1 to 4 times per year   Marital Status: Married    Review of Systems: A 12 point ROS discussed and pertinent positives are indicated in the HPI above.  All other systems are negative.  Review of Systems  Constitutional:  Negative for activity change, fatigue and fever.  HENT:  Negative for hearing loss, tinnitus and trouble swallowing.   Eyes:  Negative for visual disturbance.  Respiratory:  Negative for cough and shortness of breath.   Cardiovascular:  Negative for chest pain.  Gastrointestinal:  Negative for abdominal pain.  Musculoskeletal:  Negative for gait problem.  Neurological:  Positive for headaches. Negative for dizziness, tremors, seizures, syncope, facial asymmetry, speech difficulty, weakness, light-headedness and numbness.  Psychiatric/Behavioral:  Negative for behavioral problems and confusion.    Vital Signs: BP 129/65 (BP Location: Right Arm)   Pulse 66  Temp 98 F (36.7 C)  (Oral)   Ht 5\' 5"  (1.651 m)   Wt 185 lb (83.9 kg)   SpO2 93%   BMI 30.79 kg/m   Physical Exam Vitals reviewed.  HENT:     Mouth/Throat:     Mouth: Mucous membranes are moist.  Eyes:     Extraocular Movements: Extraocular movements intact.  Cardiovascular:     Rate and Rhythm: Normal rate and regular rhythm.     Heart sounds: Normal heart sounds.  Pulmonary:     Effort: Pulmonary effort is normal.     Breath sounds: Normal breath sounds.  Abdominal:     Palpations: Abdomen is soft.  Musculoskeletal:        General: Normal range of motion.  Skin:    General: Skin is warm.  Neurological:     Mental Status: She is alert and oriented to person, place, and time.  Psychiatric:        Behavior: Behavior normal.    Imaging: No results found.  Labs:  CBC: Recent Labs    05/24/20 1414 11/23/20 1204 03/16/21 1259 03/17/21 0751  WBC 8.3 7.6 7.9 5.4  HGB 14.8 14.5 14.1 13.8  HCT 43.5 42.2 42.1 41.0  PLT 261 230.0 224 204    COAGS: No results for input(s): INR, APTT in the last 8760 hours.  BMP: Recent Labs    03/17/20 0851 05/24/20 1414 11/23/20 1204 03/16/21 1259  NA 142 142 143 143  K 3.8 3.5 3.5 3.9  CL 103 105 105 105  CO2 29 30 31 29   GLUCOSE 105* 122* 120* 109*  BUN 17 17 18 15   CALCIUM 10.1 9.9 9.3 9.6  CREATININE 0.86 0.76 0.73 0.93  GFRNONAA >60 73  --  >60  GFRAA >60 84  --   --     LIVER FUNCTION TESTS: Recent Labs    05/24/20 1414 11/23/20 1204 03/16/21 1259  BILITOT 0.6 0.5 0.6  AST 18 16 15   ALT 13 14 11   ALKPHOS  --  54 54  PROT 6.1 6.3 6.2*  ALBUMIN  --  3.9 3.6    TUMOR MARKERS: No results for input(s): AFPTM, CEA, CA199, CHROMGRNA in the last 8760 hours.  Assessment and Plan:  Known to [facility] aneurysm embolization 2020 Recent MRA revealing: A 4 mm outpouching at the origin of the left posterior communicating artery may represent a prominent infundibulum versus aneurysm, unchanged. Pt here today for further  evaluation of this finding: scheduled for cerebral arteirogram Risks and benefits of cerebral angiogram with intervention were discussed with the patient including, but not limited to bleeding, infection, vascular injury, contrast induced renal failure, stroke or even death.  This interventional procedure involves the use of X-rays and because of the nature of the planned procedure, it is possible that we will have prolonged use of X-ray fluoroscopy.  Potential radiation risks to you include (but are not limited to) the following: - A slightly elevated risk for cancer  several years later in life. This risk is typically less than 0.5% percent. This risk is low in comparison to the normal incidence of human cancer, which is 33% for women and 50% for men according to the [place]. - Radiation induced injury can include skin redness, resembling a rash, tissue breakdown / ulcers and hair loss (which can be temporary or permanent).   The likelihood of either of these occurring depends on the difficulty of the procedure and whether you are sensitive  to radiation due to previous procedures, disease, or genetic conditions.   IF your procedure requires a prolonged use of radiation, you will be notified and given written instructions for further action.  It is your responsibility to monitor the irradiated area for the 2 weeks following the procedure and to notify your physician if you are concerned that you have suffered a radiation induced injury.    All of the patient's questions were answered, patient is agreeable to proceed.  Consent signed and in chart.     Thank you for this interesting consult.  I greatly enjoyed meeting Sheila Shields and look forward to participating in their care.  A copy of this report was sent to the requesting provider on this date.  Electronically Signed: Lavonia Drafts, PA-C 03/17/2021, 8:29 AM   I spent a total of    25 Minutes in face to face in  clinical consultation, greater than 50% of which was counseling/coordinating care for cerebral arteriogram

## 2021-03-17 NOTE — Discharge Instructions (Signed)
Radial Site Care  This sheet gives you information about how to care for yourself after your procedure. Your health care provider may also give you more specific instructions. If you have problems or questions, contact your health care provider. What can I expect after the procedure? After the procedure, it is common to have: Bruising and tenderness at the catheter insertion area. Follow these instructions at home: Medicines Take over-the-counter and prescription medicines only as told by your health care provider. Insertion site care Follow instructions from your health care provider about how to take care of your insertion site. Make sure you: Wash your hands with soap and water before you remove your bandage (dressing). If soap and water are not available, use hand sanitizer. May remove dressing in 24 hours. Check your insertion site every day for signs of infection. Check for: Redness, swelling, or pain. Fluid or blood. Pus or a bad smell. Warmth. Do no take baths, swim, or use a hot tub for 5 days. You may shower 24-48 hours after the procedure. Remove the dressing and gently wash the site with plain soap and water. Pat the area dry with a clean towel. Do not rub the site. That could cause bleeding. Do not apply powder or lotion to the site. Activity  For 24 hours after the procedure, or as directed by your health care provider: Do not flex or bend the affected arm. Do not push or pull heavy objects with the affected arm. Do not drive yourself home from the hospital or clinic. You may drive 24 hours after the procedure. Do not operate machinery or power tools. KEEP ARM ELEVATED THE REMAINDER OF THE DAY. Do not push, pull or lift anything that is heavier than 10 lb for 5 days. Ask your health care provider when it is okay to: Return to work or school. Resume usual physical activities or sports. Resume sexual activity. General instructions If the catheter site starts to  bleed, raise your arm and put firm pressure on the site. If the bleeding does not stop, get help right away. This is a medical emergency. DRINK PLENTY OF FLUIDS FOR THE NEXT 2-3 DAYS. No alcohol consumption for 24 hours after receiving sedation. If you went home on the same day as your procedure, a responsible adult should be with you for the first 24 hours after you arrive home. Keep all follow-up visits as told by your health care provider. This is important. Contact a health care provider if: You have a fever. You have redness, swelling, or yellow drainage around your insertion site. Get help right away if: You have unusual pain at the radial site. The catheter insertion area swells very fast. The insertion area is bleeding, and the bleeding does not stop when you hold steady pressure on the area. Your arm or hand becomes pale, cool, tingly, or numb. These symptoms may represent a serious problem that is an emergency. Do not wait to see if the symptoms will go away. Get medical help right away. Call your local emergency services (911 in the [place] Do not drive yourself to the hospital. Summary After the procedure, it is common to have bruising and tenderness at the site. Follow instructions from your health care provider about how to take care of your radial site wound. Check the wound every day for signs of infection.  This information is not intended to replace advice given to you by your health care provider. Make sure you discuss any questions you have with   your health care provider. Document Revised: 08/01/2017 Document Reviewed: 08/01/2017 Elsevier Patient Education  2020 Elsevier Inc.  

## 2021-03-18 ENCOUNTER — Other Ambulatory Visit (HOSPITAL_COMMUNITY): Payer: Self-pay | Admitting: Interventional Radiology

## 2021-03-18 DIAGNOSIS — I671 Cerebral aneurysm, nonruptured: Secondary | ICD-10-CM

## 2021-03-18 HISTORY — PX: IR ANGIO VERTEBRAL SEL VERTEBRAL UNI R MOD SED: IMG5368

## 2021-03-22 ENCOUNTER — Ambulatory Visit (INDEPENDENT_AMBULATORY_CARE_PROVIDER_SITE_OTHER): Payer: Medicare HMO

## 2021-03-22 ENCOUNTER — Other Ambulatory Visit: Payer: Self-pay

## 2021-03-22 DIAGNOSIS — Z23 Encounter for immunization: Secondary | ICD-10-CM

## 2021-03-23 ENCOUNTER — Other Ambulatory Visit: Payer: Self-pay

## 2021-03-23 ENCOUNTER — Emergency Department (HOSPITAL_BASED_OUTPATIENT_CLINIC_OR_DEPARTMENT_OTHER)
Admission: EM | Admit: 2021-03-23 | Discharge: 2021-03-23 | Disposition: A | Discharge status: Home / Self Care | Payer: Medicare HMO | Attending: Emergency Medicine | Admitting: Emergency Medicine

## 2021-03-23 ENCOUNTER — Encounter (HOSPITAL_BASED_OUTPATIENT_CLINIC_OR_DEPARTMENT_OTHER): Payer: Self-pay | Admitting: Urology

## 2021-03-23 DIAGNOSIS — X58XXXD Exposure to other specified factors, subsequent encounter: Secondary | ICD-10-CM | POA: Insufficient documentation

## 2021-03-23 DIAGNOSIS — T148XXA Other injury of unspecified body region, initial encounter: Secondary | ICD-10-CM

## 2021-03-23 DIAGNOSIS — J449 Chronic obstructive pulmonary disease, unspecified: Secondary | ICD-10-CM | POA: Diagnosis not present

## 2021-03-23 DIAGNOSIS — I10 Essential (primary) hypertension: Secondary | ICD-10-CM | POA: Diagnosis not present

## 2021-03-23 DIAGNOSIS — Z7982 Long term (current) use of aspirin: Secondary | ICD-10-CM | POA: Diagnosis not present

## 2021-03-23 DIAGNOSIS — Z87891 Personal history of nicotine dependence: Secondary | ICD-10-CM | POA: Insufficient documentation

## 2021-03-23 DIAGNOSIS — S60211A Contusion of right wrist, initial encounter: Secondary | ICD-10-CM | POA: Diagnosis not present

## 2021-03-23 DIAGNOSIS — S6991XA Unspecified injury of right wrist, hand and finger(s), initial encounter: Secondary | ICD-10-CM | POA: Diagnosis not present

## 2021-03-23 DIAGNOSIS — L7632 Postprocedural hematoma of skin and subcutaneous tissue following other procedure: Secondary | ICD-10-CM | POA: Diagnosis not present

## 2021-03-23 NOTE — ED Triage Notes (Signed)
Right wrist brain angio last week, bruising noted to right wrist.  Stages of healing noted. Knot noted under bruising.  States burning pain

## 2021-03-23 NOTE — Discharge Instructions (Addendum)
Overall suspect that you likely have a hematoma related to your recent procedure.  Suspect that this is improving and will resolve.  Follow-up with your primary care doctor or vascular surgery.  Please return if you develop any worsening pain, weakness, numbness in the hand as we discussed.

## 2021-03-23 NOTE — ED Provider Notes (Signed)
[facility] EMERGENCY DEPT Provider Note   CSN: [id number] Arrival date & time: 03/23/21  1353     History Chief Complaint  Patient presents with   Arm Injury    Sheila Shields is a 86 y.o. female.  Pain at right wrist with bruising from surgical site.  Patient had aneurysm treated through right radial access about a week ago.  Has had some bruising and wanted area to be evaluated.  Denies any numbness or weakness to her hand.  The history is provided by the patient.  Arm Injury Upper extremity pain location: wrist. Ineffective treatments:  None tried Associated symptoms: no back pain, no decreased range of motion, no fatigue, no fever, no muscle weakness, no neck pain, no numbness and no stiffness       Past Medical History:  Diagnosis Date   Alcoholism in remission ([place]) 08/27/2007   Anemia    Arthritis    OA   Breast cancer ([place])    left breast   COPD 12/17/2006   Diverticulosis 03/27/2007   GERD 05/01/2007   HYPERGLYCEMIA 08/27/2007   HYPERLIPIDEMIA 12/17/2006   HYPERTENSION 12/17/2006   Mood disorder ([place])    hx anxiety and depression. meds short term during life transition   Pneumonia    AS A CHILD   Rectal polyp 03/27/2007   adenoma   SOB (shortness of breath) on exertion    per patient due to having COPD   TIA (transient ischemic attack)     Patient Active Problem List   Diagnosis Date Noted   Insomnia 05/24/2020   Mild aortic stenosis 05/24/2020   Malignant neoplasm of upper-outer quadrant of left breast in female, estrogen receptor positive ([place]) 02/12/2020   Former smoker 05/22/2019   Brain aneurysm 12/30/2018   Aortic atherosclerosis ([place]) 04/11/2018   Allergic rhinitis 11/09/2017   Arm mass, left 04/16/2017   Chronic cough 10/11/2015   COPD (chronic obstructive pulmonary disease) ([place]) 11/18/2014   Iron deficiency anemia, unspecified 01/20/2011   Arthropathy 02/23/2010   HYPERGLYCEMIA 08/27/2007   Alcoholism in remission ([place])  08/27/2007   Gastroesophageal reflux disease 05/01/2007   Hyperlipemia 12/17/2006   Essential hypertension 12/17/2006    Past Surgical History:  Procedure Laterality Date   ABDOMINAL HYSTERECTOMY  1978   fibroma   APPENDECTOMY  2002   BREAST EXCISIONAL BIOPSY Right 2003   negative   BREAST LUMPECTOMY WITH RADIOACTIVE SEED LOCALIZATION Left 03/23/2020   Procedure: LEFT BREAST LUMPECTOMY WITH RADIOACTIVE SEED LOCALIZATION;  Surgeon: [name], MD;  Location: [facility];  Service: General;  Laterality: Left;  RNFA   CARPAL TUNNEL RELEASE Left 2010 or 2011   CATARACT EXTRACTION W/ INTRAOCULAR LENS  IMPLANT, BILATERAL Bilateral    COLONOSCOPY W/ POLYPECTOMY  03/27/2007; n7/19/2012   2008: 4 mm adenoma, diverticulosis 2012: ileitis ? NSAID - likely, 2-3 mm cecal polyp LYMPHOID FOLLICLE, diverticulosis   ESOPHAGOGASTRODUODENOSCOPY  01/26/2011   reflux esophagitis, colonscopy done also   HAMMER TOE SURGERY  2008   left foot   HERNIA REPAIR Right 1996?   IR ANGIO INTRA EXTRACRAN SEL COM CAROTID INNOMINATE UNI R MOD SED  11/28/2018   IR ANGIO INTRA EXTRACRAN SEL COM CAROTID INNOMINATE UNI R MOD SED  03/17/2021   IR ANGIO INTRA EXTRACRAN SEL INTERNAL CAROTID UNI R MOD SED  12/30/2018   IR ANGIO VERTEBRAL SEL SUBCLAVIAN INNOMINATE UNI R MOD SED  11/28/2018   IR ANGIO VERTEBRAL SEL VERTEBRAL UNI R MOD SED  03/18/2021   IR ANGIOGRAM  FOLLOW UP STUDY  12/30/2018   IR TRANSCATH/EMBOLIZ  12/30/2018   IR US GUIDE VASC ACCESS RIGHT  11/28/2018   IR US GUIDE VASC ACCESS RIGHT  03/17/2021   ORIF ANKLE FRACTURE Right 12/22/2014   Procedure: OPEN REDUCTION INTERNAL FIXATION (ORIF) ANKLE FRACTURE;  Surgeon: Susa Day, MD;  Location: [facility];  Service: Orthopedics;  Laterality: Right;   RADIOLOGY WITH ANESTHESIA N/A 12/30/2018   Procedure: RADIOLOGY WITH ANESTHESIA;  Surgeon: Luanne Bras, MD;  Location: [facility];  Service: Radiology;  Laterality: N/A;   SHOULDER OPEN ROTATOR CUFF REPAIR Left 03/06/2013   Procedure:  LEFT ROTATOR CUFF REPAIR, SUBACROMIAL DECOMPRESSION, PATCH GRAFT, MANIPULATION UNDER ANESTHESIA;  Surgeon: Johnn Hai, MD;  Location: [facility];  Service: Orthopedics;  Laterality: Left;   TONSILLECTOMY       OB History   No obstetric history on file.     Family History  Problem Relation Age of Onset   Throat cancer Mother        47   Heart attack Father 2   Idiopathic pulmonary fibrosis Brother 55   Cancer Brother        sarcoma   Heart disease Brother    Colon cancer Neg Hx    Esophageal cancer Neg Hx    Stomach cancer Neg Hx     Social History   Tobacco Use   Smoking status: Former    Packs/day: 2.00    Years: 30.00    Pack years: 60.00    Types: Cigarettes    Quit date: 07/10/1984    Years since quitting: 36.7   Smokeless tobacco: Never  Vaping Use   Vaping Use: Never used  Substance Use Topics   Alcohol use: No    Alcohol/week: 0.0 standard drinks    Comment: no alcoho since 1983   Drug use: No    Home Medications Prior to Admission medications   Medication Sig Start Date End Date Taking? Authorizing Provider  acetaminophen (TYLENOL) 650 MG CR tablet Take 1,300 mg by mouth 2 (two) times daily.    [provider]  albuterol (PROAIR HFA) 108 (90 Base) MCG/ACT inhaler INHALE 2 PUFFS EVERY 4 HOURS AS NEEDED FOR COUGHING SPELLS 05/24/20   Marin Olp, MD  aspirin 325 MG tablet Take 1 tablet (325 mg total) by mouth daily. 01/01/19   Louk, Bea Graff, PA-C  clopidogrel (PLAVIX) 75 MG tablet Take 0.5 tablets (37.5 mg total) by mouth daily. 02/01/21   Marin Olp, MD  Coenzyme Q10 (COQ-10 PO) Take 1 capsule by mouth daily.    [provider]  Cyanocobalamin (VITAMIN B-12 PO) Take 1 tablet by mouth daily.    [provider]  diclofenac Sodium (VOLTAREN) 1 % GEL Apply 2 g topically 4 (four) times daily. Patient taking differently: Apply 2 g topically 4 (four) times daily as needed (pain). 05/24/20   Marin Olp, MD   fexofenadine (ALLEGRA) 180 MG tablet Take 180 mg by mouth daily.    [provider]  fluticasone (FLONASE) 50 MCG/ACT nasal spray Place 2 sprays into both nostrils daily. 09/24/18   Collene Gobble, MD  hydrochlorothiazide (HYDRODIURIL) 25 MG tablet Take 0.5 tablets (12.5 mg total) by mouth daily. 11/23/20   Marin Olp, MD  Multiple Vitamin (MULTIVITAMIN WITH MINERALS) TABS tablet Take 1 tablet by mouth daily.    [provider]  pantoprazole (PROTONIX) 20 MG tablet TAKE ONE TABLET BY MOUTH DAILY 04/27/20   Marin Olp, MD  potassium chloride SA (KLOR-CON M20) 20 MEQ tablet Take 1 tablet (20 mEq total) by mouth every other day. 11/23/20   Marin Olp, MD  Propylene Glycol (SYSTANE BALANCE OP) Place 1 drop into both eyes every 6 (six) hours as needed (dry eyes).    [provider]  rosuvastatin (CRESTOR) 20 MG tablet TAKE ONE TABLET BY MOUTH DAILY 11/02/20   Marin Olp, MD  tamoxifen (NOLVADEX) 20 MG tablet TAKE ONE TABLET BY MOUTH DAILY 09/21/20   Truitt Merle, MD  traMADol (ULTRAM) 50 MG tablet TAKE ONE TABLET BY MOUTH EVERY 8 HOURS AS NEEDED FOR PAIN FOR UP TO 5 DAYS (DO NOT DRIVE FOR 8 HOURS AFTER TAKING) 02/22/21   Marin Olp, MD  traZODone (DESYREL) 50 MG tablet TAKE 0.5-1 TABLETS (25-50 MG TOTAL) BY MOUTH AT BEDTIME AS NEEDED FOR SLEEP. Patient taking differently: Take 50 mg by mouth at bedtime. 02/22/21   Marin Olp, MD  Turmeric (QC TUMERIC COMPLEX PO) Take 1 tablet by mouth daily.    [provider]  VITAMIN D PO Take 1 capsule by mouth daily.    [provider]    Allergies    Alcohol, Codeine, and Dilaudid [hydromorphone hcl]  Review of Systems   Review of Systems  Constitutional:  Negative for fatigue and fever.  Cardiovascular:  Negative for leg swelling.  Musculoskeletal:  Negative for arthralgias, back pain, joint swelling, neck pain and stiffness.  Skin:  Positive for color change. Negative for wound.   Neurological:  Negative for weakness, numbness and headaches.   Physical Exam Updated Vital Signs BP 131/88 (BP Location: Left Arm)   Pulse 77   Temp (!) 97.5 F (36.4 C)   Resp 18   Ht 5\' 5"  (1.651 m)   Wt 83.9 kg   SpO2 93%   BMI 30.79 kg/m   Physical Exam Constitutional:      General: She is not in acute distress.    Appearance: She is not ill-appearing.  HENT:     Head: Normocephalic.  Cardiovascular:     Pulses: Normal pulses.     Heart sounds: Normal heart sounds.  Pulmonary:     Effort: Pulmonary effort is normal.  Musculoskeletal:     Cervical back: Normal range of motion.  Skin:    Capillary Refill: Capillary refill takes less than 2 seconds.     Findings: Bruising present.     Comments: Bruising to the right wrist  Neurological:     General: No focal deficit present.     Mental Status: She is alert.     Sensory: No sensory deficit.     Motor: No weakness.     Comments: 5+ out of 5 strength in bilateral upper extremities including the hands, normal sensation in the hands    ED Results / Procedures / Treatments   Labs (all labs ordered are listed, but only abnormal results are displayed) Labs Reviewed - No data to display  EKG None  Radiology No results found.  Procedures Procedures   Medications Ordered in ED Medications - No data to display  ED Course  I have reviewed the triage vital signs and the nursing notes.  Pertinent labs & imaging results that were available during my care of the patient were reviewed by me and considered in my medical decision making (see chart for details).    MDM Rules/Calculators/A&P  EMINE LOPATA is here for evaluation of right wrist bruising.  Normal vitals.  No fever.  Had aneurysm repair to her brain last week through right radial artery access.  She has had some bruising in the right wrist and wanted that to be evaluated.  She has pulses in bilateral upper extremities both in her  radial and ulnar distribution especially in the right wrist.  Suspect that there is a resolving hematoma in this area.  Normal strength and sensation to bilateral upper extremities.  There is bruising but nothing out of proportion to exam.  No concern for compartment syndrome.  Her hand is well-perfused.  She has Doppler pulses as well confirmed to both ulnar and radial artery in the right upper extremity.  Overall suspect resolving hematoma.  Given reassurance.  Given information to follow-up with vascular surgery if needed.  Understands return precautions.  Discharged in good condition.  This chart was dictated using voice recognition software.  Despite best efforts to proofread,  errors can occur which can change the documentation meaning.   Final Clinical Impression(s) / ED Diagnoses Final diagnoses:  Hematoma    Rx / DC Orders ED Discharge Orders     None        Lennice Sites, DO 03/23/21 1615

## 2021-03-24 ENCOUNTER — Telehealth: Payer: Self-pay

## 2021-03-24 NOTE — Telephone Encounter (Signed)
.  Type of forms received:HEALTH INFORMATION  Routed XV:EZBMZT TEAM CARE   Paperwork received by : Vikki Gains   Individual made aware of 3-5 business day turn around (Y/N):YES  Form completed and patient made aware of charges(Y/N):   Faxed to : [phone]  Form location:  [place]

## 2021-03-25 NOTE — Telephone Encounter (Signed)
Noted  

## 2021-03-28 NOTE — Progress Notes (Signed)
I, Wendy Poet, LAT, ATC, am serving as scribe for Dr. Lynne Leader.  Sheila Shields is a 86 y.o. female who presents to [place] at [facility] today for L calf pain after tripping on the sidewalk at her new house and catching herself, straining her L lateral calf.  She locates her pain to her L post-lat calf.  L calf swelling: yes Aggravating factors: walking and weight-bearing activity Treatments tried: Tramadol; diclofenac topical gel; rest  Additionally patient is incidentally scheduled for her Orthovisc series left knee to start 2 days from now on Thursday, September 22.  She wonders if this can be started today since she is already here at the office.  She also notes that she is moving to a retirement [employer] in about a week.  Physical therapy is offered in that location.   Pertinent review of systems: No fevers or chills  Relevant historical information: Hypertension.  History of brain aneurysm.   Exam:  BP 130/70 (BP Location: Right Arm, Patient Position: Sitting, Cuff Size: Normal)   Pulse 82   Ht 5\' 5"  (1.651 m)   Wt 189 lb 12.8 oz (86.1 kg)   SpO2 (!) 88%   BMI 31.58 kg/m  General: Well Developed, well nourished, and in no acute distress.   MSK:  Left knee mild effusion normal motion with crepitation.  Stable ligamentous exam.  Left calf swollen tender palpation especially medial head gastrocnemius.  Normal foot and ankle motion.  Pain with resisted foot plantarflexion.  Antalgic gait present.    Lab and Radiology Results  Diagnostic Limited MSK Ultrasound of: Left calf Achilles tendon is intact.  Medial head gastrocnemius with hypoechoic fluid superficial to muscle tendinous junction slight disruption of tendon fibers consistent with mild muscle tear and hematoma. Impression: Medial gastrocnemius muscle strain/tear  Left knee Orthovisc 1/3 Procedure: Real-time Ultrasound Guided Injection of left knee superior lateral  patellar space Device: Philips Affiniti 50G Images permanently stored and available for review in PACS Verbal informed consent obtained.  Discussed risks and benefits of procedure. Warned about infection bleeding damage to structures skin hypopigmentation and fat atrophy among others. Patient expresses understanding and agreement Time-out conducted.   Noted no overlying erythema, induration, or other signs of local infection.   Skin prepped in a sterile fashion.   Local anesthesia: Topical Ethyl chloride.   With sterile technique and under real time ultrasound guidance: Orthovisc 30 mg injected into knee joint. Fluid seen entering the joint capsule.   Completed without difficulty   Advised to call if fevers/chills, erythema, induration, drainage, or persistent bleeding.   Images permanently stored and available for review in the ultrasound unit.  Impression: Technically successful ultrasound guided injection.  Lot number: [id number]     Assessment and Plan: 86 y.o. female with  Left calf strain.  Fortunately patient only tripped and did not fall.  She suffered a left medial head gastrocnemius muscle strain/mild muscle tear.  Plan to treat with home exercise program taught in clinic today by ATC and referral to PT.  She will be following back up in 1 week for her Orthovisc injection left knee #2 so we can check on that calf during that visit as well.  Expect this will take a month or so to get better.  Recommend using a walker to ambulate if having a lot of pain.  Left knee pain and DJD.  Okay to move the Orthovisc series up by 2 days and starting today.  Scheduled  for Orthovisc injection 2/3 in about 1 week.   PDMP not reviewed this encounter. Orders Placed This Encounter  Procedures   [place] LIMITED JOINT SPACE STRUCTURES LOW LEFT(NO LINKED CHARGES)    Order Specific Question:   Reason for Exam (SYMPTOM  OR DIAGNOSIS REQUIRED)    Answer:   L knee pain    Order Specific Question:   Preferred  imaging location?    Answer:   [place]   Ambulatory referral to Physical Therapy    Referral Priority:   Routine    Referral Type:   Physical Medicine    Referral Reason:   Specialty Services Required    Requested Specialty:   Physical Therapy    Number of Visits Requested:   1   No orders of the defined types were placed in this encounter.    Discussed warning signs or symptoms. Please see discharge instructions. Patient expresses understanding.   The above documentation has been reviewed and is accurate and complete Lynne Leader, M.D.

## 2021-03-29 ENCOUNTER — Encounter: Payer: Self-pay | Admitting: Family Medicine

## 2021-03-29 ENCOUNTER — Ambulatory Visit: Payer: Medicare HMO | Admitting: Family Medicine

## 2021-03-29 ENCOUNTER — Other Ambulatory Visit: Payer: Self-pay

## 2021-03-29 ENCOUNTER — Ambulatory Visit: Payer: Self-pay

## 2021-03-29 VITALS — BP 130/70 | HR 82 | Ht 65.0 in | Wt 189.8 lb

## 2021-03-29 DIAGNOSIS — G8929 Other chronic pain: Secondary | ICD-10-CM | POA: Diagnosis not present

## 2021-03-29 DIAGNOSIS — M1712 Unilateral primary osteoarthritis, left knee: Secondary | ICD-10-CM

## 2021-03-29 DIAGNOSIS — M79662 Pain in left lower leg: Secondary | ICD-10-CM

## 2021-03-29 DIAGNOSIS — M25562 Pain in left knee: Secondary | ICD-10-CM

## 2021-03-29 DIAGNOSIS — S86112A Strain of other muscle(s) and tendon(s) of posterior muscle group at lower leg level, left leg, initial encounter: Secondary | ICD-10-CM

## 2021-03-29 NOTE — Patient Instructions (Signed)
Good to see you today.  You had a L knee Orthovisc injection 1/3.  Call or go to the ER if you develop a large red swollen joint with extreme pain or oozing puss.   Please perform the exercise program that we have prepared for you and gone over in detail on a daily basis.  In addition to the handout you were provided you can access your program through: [email or web address]   Your unique program code is:  [email or web address]  Please schedule 2 follow-up appointments for the next 2 weeks (1/week for the next 2 weeks) to get the 2nd and 3rd Orthovisc injections in the series.

## 2021-03-30 ENCOUNTER — Other Ambulatory Visit: Payer: Self-pay | Admitting: Student

## 2021-03-30 ENCOUNTER — Ambulatory Visit (INDEPENDENT_AMBULATORY_CARE_PROVIDER_SITE_OTHER): Payer: Medicare HMO | Admitting: Physician Assistant

## 2021-03-30 ENCOUNTER — Encounter: Payer: Self-pay | Admitting: Physician Assistant

## 2021-03-30 VITALS — BP 124/60 | HR 87 | Temp 97.3°F | Ht 65.0 in | Wt 190.2 lb

## 2021-03-30 DIAGNOSIS — I671 Cerebral aneurysm, nonruptured: Secondary | ICD-10-CM

## 2021-03-30 DIAGNOSIS — M25531 Pain in right wrist: Secondary | ICD-10-CM | POA: Diagnosis not present

## 2021-03-30 DIAGNOSIS — M25431 Effusion, right wrist: Secondary | ICD-10-CM | POA: Diagnosis not present

## 2021-03-30 NOTE — Progress Notes (Signed)
Sheila Shields is a 86 y.o. female is here to discuss: ED f/u  I acted as a Education administrator for [employer], PA-C Anselmo Pickler, LPN   History of Present Illness:   Chief Complaint  Patient presents with   Follow-up    ED    HPI  R wrist hematoma/pain --  ED follow-up Pt was seen in the ED on 9/14 for hematoma on right wrist from surgical procedure done on 9/8  -- she had aneurysm repair to her brain via R radial access. Bruising has improved much but she still has a tender knot on her R wrist. Denies: fever, chills, malaise, bleeding. She has not used compression or any other intervention.  Health Maintenance Due  Topic Date Due   COVID-19 Vaccine (5 - Booster for Pfizer series) 09/23/2020    Past Medical History:  Diagnosis Date   Alcoholism in remission ([place]) 08/27/2007   Anemia    Arthritis    OA   Breast cancer ([place])    left breast   COPD 12/17/2006   Diverticulosis 03/27/2007   GERD 05/01/2007   HYPERGLYCEMIA 08/27/2007   HYPERLIPIDEMIA 12/17/2006   HYPERTENSION 12/17/2006   Mood disorder ([place])    hx anxiety and depression. meds short term during life transition   Pneumonia    AS A CHILD   Rectal polyp 03/27/2007   adenoma   SOB (shortness of breath) on exertion    per patient due to having COPD   TIA (transient ischemic attack)      Social History   Tobacco Use   Smoking status: Former    Packs/day: 2.00    Years: 30.00    Pack years: 60.00    Types: Cigarettes    Quit date: 07/10/1984    Years since quitting: 36.7   Smokeless tobacco: Never  Vaping Use   Vaping Use: Never used  Substance Use Topics   Alcohol use: No    Alcohol/week: 0.0 standard drinks    Comment: no alcoho since 1983   Drug use: No    Past Surgical History:  Procedure Laterality Date   ABDOMINAL HYSTERECTOMY  1978   fibroma   APPENDECTOMY  2002   BREAST EXCISIONAL BIOPSY Right 2003   negative   BREAST LUMPECTOMY WITH RADIOACTIVE SEED LOCALIZATION Left 03/23/2020   Procedure:  LEFT BREAST LUMPECTOMY WITH RADIOACTIVE SEED LOCALIZATION;  Surgeon: [name], MD;  Location: [facility];  Service: General;  Laterality: Left;  RNFA   CARPAL TUNNEL RELEASE Left 2010 or 2011   CATARACT EXTRACTION W/ INTRAOCULAR LENS  IMPLANT, BILATERAL Bilateral    COLONOSCOPY W/ POLYPECTOMY  03/27/2007; n7/19/2012   2008: 4 mm adenoma, diverticulosis 2012: ileitis ? NSAID - likely, 2-3 mm cecal polyp LYMPHOID FOLLICLE, diverticulosis   ESOPHAGOGASTRODUODENOSCOPY  01/26/2011   reflux esophagitis, colonscopy done also   HAMMER TOE SURGERY  2008   left foot   HERNIA REPAIR Right 1996?   IR ANGIO INTRA EXTRACRAN SEL COM CAROTID INNOMINATE UNI R MOD SED  11/28/2018   IR ANGIO INTRA EXTRACRAN SEL COM CAROTID INNOMINATE UNI R MOD SED  03/17/2021   IR ANGIO INTRA EXTRACRAN SEL INTERNAL CAROTID UNI R MOD SED  12/30/2018   IR ANGIO VERTEBRAL SEL SUBCLAVIAN INNOMINATE UNI R MOD SED  11/28/2018   IR ANGIO VERTEBRAL SEL VERTEBRAL UNI R MOD SED  03/18/2021   IR ANGIOGRAM FOLLOW UP STUDY  12/30/2018   IR TRANSCATH/EMBOLIZ  12/30/2018   IR US GUIDE VASC ACCESS RIGHT  11/28/2018   IR US GUIDE VASC ACCESS RIGHT  03/17/2021   ORIF ANKLE FRACTURE Right 12/22/2014   Procedure: OPEN REDUCTION INTERNAL FIXATION (ORIF) ANKLE FRACTURE;  Surgeon: Susa Day, MD;  Location: [facility];  Service: Orthopedics;  Laterality: Right;   RADIOLOGY WITH ANESTHESIA N/A 12/30/2018   Procedure: RADIOLOGY WITH ANESTHESIA;  Surgeon: Luanne Bras, MD;  Location: [facility];  Service: Radiology;  Laterality: N/A;   SHOULDER OPEN ROTATOR CUFF REPAIR Left 03/06/2013   Procedure: LEFT ROTATOR CUFF REPAIR, SUBACROMIAL DECOMPRESSION, PATCH GRAFT, MANIPULATION UNDER ANESTHESIA;  Surgeon: Johnn Hai, MD;  Location: [facility];  Service: Orthopedics;  Laterality: Left;   TONSILLECTOMY      Family History  Problem Relation Age of Onset   Throat cancer Mother        47   Heart attack Father 26   Idiopathic pulmonary fibrosis Brother 33   Cancer  Brother        sarcoma   Heart disease Brother    Colon cancer Neg Hx    Esophageal cancer Neg Hx    Stomach cancer Neg Hx     PMHx, SurgHx, SocialHx, FamHx, Medications, and Allergies were reviewed in the Visit Navigator and updated as appropriate.   Patient Active Problem List   Diagnosis Date Noted   Insomnia 05/24/2020   Mild aortic stenosis 05/24/2020   Malignant neoplasm of upper-outer quadrant of left breast in female, estrogen receptor positive ([place]) 02/12/2020   Former smoker 05/22/2019   Brain aneurysm 12/30/2018   Aortic atherosclerosis ([place]) 04/11/2018   Allergic rhinitis 11/09/2017   Arm mass, left 04/16/2017   Chronic cough 10/11/2015   COPD (chronic obstructive pulmonary disease) ([place]) 11/18/2014   Iron deficiency anemia, unspecified 01/20/2011   Arthropathy 02/23/2010   HYPERGLYCEMIA 08/27/2007   Alcoholism in remission ([place]) 08/27/2007   Gastroesophageal reflux disease 05/01/2007   Hyperlipemia 12/17/2006   Essential hypertension 12/17/2006    Social History   Tobacco Use   Smoking status: Former    Packs/day: 2.00    Years: 30.00    Pack years: 60.00    Types: Cigarettes    Quit date: 07/10/1984    Years since quitting: 36.7   Smokeless tobacco: Never  Vaping Use   Vaping Use: Never used  Substance Use Topics   Alcohol use: No    Alcohol/week: 0.0 standard drinks    Comment: no alcoho since 1983   Drug use: No    Current Medications and Allergies:    Current Outpatient Medications:    acetaminophen (TYLENOL) 650 MG CR tablet, Take 1,300 mg by mouth 2 (two) times daily., Disp: , Rfl:    albuterol (PROAIR HFA) 108 (90 Base) MCG/ACT inhaler, INHALE 2 PUFFS EVERY 4 HOURS AS NEEDED FOR COUGHING SPELLS, Disp: 18 g, Rfl: 5   aspirin 325 MG tablet, Take 1 tablet (325 mg total) by mouth daily., Disp: 30 tablet, Rfl: 3   clopidogrel (PLAVIX) 75 MG tablet, Take 0.5 tablets (37.5 mg total) by mouth daily., Disp: 46 tablet, Rfl: 3   Coenzyme Q10 (COQ-10  PO), Take 1 capsule by mouth daily., Disp: , Rfl:    Cyanocobalamin (VITAMIN B-12 PO), Take 1 tablet by mouth daily., Disp: , Rfl:    diclofenac Sodium (VOLTAREN) 1 % GEL, Apply 2 g topically 4 (four) times daily. (Patient taking differently: Apply 2 g topically 4 (four) times daily as needed (pain).), Disp: 100 g, Rfl: 11   fexofenadine (ALLEGRA) 180 MG tablet, Take 180 mg by  mouth daily., Disp: , Rfl:    fluticasone (FLONASE) 50 MCG/ACT nasal spray, Place 2 sprays into both nostrils daily., Disp: 16 g, Rfl: 2   hydrochlorothiazide (HYDRODIURIL) 25 MG tablet, Take 0.5 tablets (12.5 mg total) by mouth daily., Disp: 46 tablet, Rfl: 3   Multiple Vitamin (MULTIVITAMIN WITH MINERALS) TABS tablet, Take 1 tablet by mouth daily., Disp: , Rfl:    pantoprazole (PROTONIX) 20 MG tablet, TAKE ONE TABLET BY MOUTH DAILY, Disp: 90 tablet, Rfl: 3   potassium chloride SA (KLOR-CON M20) 20 MEQ tablet, Take 1 tablet (20 mEq total) by mouth every other day., Disp: 46 tablet, Rfl: 3   Propylene Glycol (SYSTANE BALANCE OP), Place 1 drop into both eyes every 6 (six) hours as needed (dry eyes)., Disp: , Rfl:    rosuvastatin (CRESTOR) 20 MG tablet, TAKE ONE TABLET BY MOUTH DAILY, Disp: 90 tablet, Rfl: 3   tamoxifen (NOLVADEX) 20 MG tablet, TAKE ONE TABLET BY MOUTH DAILY, Disp: 30 tablet, Rfl: 6   traMADol (ULTRAM) 50 MG tablet, TAKE ONE TABLET BY MOUTH EVERY 8 HOURS AS NEEDED FOR PAIN FOR UP TO 5 DAYS (DO NOT DRIVE FOR 8 HOURS AFTER TAKING), Disp: 15 tablet, Rfl: 0   traZODone (DESYREL) 50 MG tablet, TAKE 0.5-1 TABLETS (25-50 MG TOTAL) BY MOUTH AT BEDTIME AS NEEDED FOR SLEEP. (Patient taking differently: Take 50 mg by mouth at bedtime.), Disp: 30 tablet, Rfl: 3   Turmeric (QC TUMERIC COMPLEX PO), Take 1 tablet by mouth daily., Disp: , Rfl:    VITAMIN D PO, Take 1 capsule by mouth daily., Disp: , Rfl:    Allergies  Allergen Reactions   Alcohol Other (See Comments)    Recovering Alcoholic*   Codeine     Patient prefers  not to take this due to history of alcoholism   Dilaudid [Hydromorphone Hcl]     Pt had hypotension after dilaudid 1mg  IV while in the OR, required temporary pressor intervention.    Review of Systems   ROS Negative unless otherwise specified per HPI.  Vitals:   Vitals:   03/30/21 1421  BP: 124/60  Pulse: 87  Temp: (!) 97.3 F (36.3 C)  TempSrc: Temporal  SpO2: 90%  Weight: 190 lb 4 oz (86.3 kg)  Height: 5\' 5"  (1.651 m)     Body mass index is 31.66 kg/m.   Physical Exam:    Physical Exam Vitals and nursing note reviewed.  Constitutional:      General: She is not in acute distress.    Appearance: She is well-developed. She is not ill-appearing or toxic-appearing.  Cardiovascular:     Rate and Rhythm: Normal rate and regular rhythm.     Pulses: Normal pulses.          Radial pulses are 2+ on the right side.     Heart sounds: Normal heart sounds, S1 normal and S2 normal.  Pulmonary:     Effort: Pulmonary effort is normal.     Breath sounds: Normal breath sounds.  Musculoskeletal:     Comments: Raised tender nodule to R radial wrist  Skin:    General: Skin is warm and dry.  Neurological:     Mental Status: She is alert.     GCS: GCS eye subscore is 4. GCS verbal subscore is 5. GCS motor subscore is 6.     Comments: Grip strength 5/5 bilaterally  Psychiatric:        Speech: Speech normal.  Behavior: Behavior normal. Behavior is cooperative.     Assessment and Plan:   Right wrist swelling and pain Symptoms are overall improving except for R radial nodule I called and spoke with [facility] NP Soyla Dryer re: patient and discussed situation Per Roselyn Reef, Dr. Estanislado Pandy will review information and likely schedule patient for f/u u/s for further evaluation -- she states that their office will call patient I discussed this with the patient and she verbalized understanding.  If she has not heard from their office by Friday, I have asked her to let our office  know Worsening symptoms advised in the interim  CMA or LPN served as scribe during this visit. History, Physical, and Plan performed by medical provider. The above documentation has been reviewed and is accurate and complete.  Inda Coke, PA-C [place] [place] 03/30/2021  Follow-up: No follow-ups on file.

## 2021-03-30 NOTE — Patient Instructions (Signed)
It was great to see you!  You should be getting a call from [place] office in the next few days to follow-up on your wrist.  Take care,  Inda Coke PA-C

## 2021-03-31 ENCOUNTER — Other Ambulatory Visit: Payer: Self-pay

## 2021-03-31 ENCOUNTER — Ambulatory Visit: Payer: Medicare HMO | Admitting: Family Medicine

## 2021-03-31 ENCOUNTER — Telehealth (HOSPITAL_COMMUNITY): Payer: Self-pay

## 2021-03-31 ENCOUNTER — Ambulatory Visit (HOSPITAL_COMMUNITY)
Admission: RE | Admit: 2021-03-31 | Discharge: 2021-03-31 | Disposition: A | Discharge status: Home / Self Care | Payer: Medicare HMO | Source: Ambulatory Visit | Attending: Student | Admitting: Student

## 2021-03-31 DIAGNOSIS — I671 Cerebral aneurysm, nonruptured: Secondary | ICD-10-CM | POA: Diagnosis not present

## 2021-03-31 NOTE — Telephone Encounter (Signed)
Called pt to let her know that Deveshwar would like her to come in for consult to take a look at her wrist. She was not home, left a vm to return call. AW

## 2021-03-31 NOTE — Progress Notes (Signed)
VASCULAR LAB    Right upper extremity arterial duplex has been performed.  See CV proc for preliminary results.   Bernyce Brimley, RVT 03/31/2021, 8:33 AM

## 2021-04-04 ENCOUNTER — Telehealth (HOSPITAL_COMMUNITY): Payer: Self-pay

## 2021-04-04 ENCOUNTER — Encounter: Payer: Self-pay | Admitting: Family Medicine

## 2021-04-04 ENCOUNTER — Ambulatory Visit: Payer: Medicare HMO | Admitting: Family Medicine

## 2021-04-04 ENCOUNTER — Other Ambulatory Visit: Payer: Self-pay

## 2021-04-04 ENCOUNTER — Ambulatory Visit: Payer: Self-pay

## 2021-04-04 VITALS — BP 138/80 | HR 86 | Ht 65.0 in | Wt 189.4 lb

## 2021-04-04 DIAGNOSIS — M1712 Unilateral primary osteoarthritis, left knee: Secondary | ICD-10-CM

## 2021-04-04 DIAGNOSIS — M25562 Pain in left knee: Secondary | ICD-10-CM

## 2021-04-04 DIAGNOSIS — S86112D Strain of other muscle(s) and tendon(s) of posterior muscle group at lower leg level, left leg, subsequent encounter: Secondary | ICD-10-CM | POA: Diagnosis not present

## 2021-04-04 DIAGNOSIS — G8929 Other chronic pain: Secondary | ICD-10-CM | POA: Diagnosis not present

## 2021-04-04 MED ORDER — TRAMADOL HCL 50 MG PO TABS: ORAL_TABLET | ORAL | 0 refills | Status: DC

## 2021-04-04 NOTE — Progress Notes (Deleted)
   I, Wendy Poet, LAT, ATC, am serving as scribe for Dr. Lynne Leader.  Sheila Shields is a 86 y.o. female who presents to [place] at [facility] today for f/u of L calf pain and for L knee Orthovisc injection 2/3.  She was last seen by Dr. Georgina Snell on 03/29/21 w/ c/o L post-lat calf pain after tripping on the sidewalk and straining her calf.  She was shown a HEP focusing on L ankle AROM and some limited ankle theraband strengthening.  Today, pt reports   Diagnostic imaging: L knee XR- 07/14/20  Pertinent review of systems: ***  Relevant historical information: ***   Exam:  There were no vitals taken for this visit. General: Well Developed, well nourished, and in no acute distress.   MSK: ***    Lab and Radiology Results No results found for this or any previous visit (from the past 72 hour(s)). No results found.     Assessment and Plan: 86 y.o. female with ***   PDMP not reviewed this encounter. No orders of the defined types were placed in this encounter.  No orders of the defined types were placed in this encounter.    Discussed warning signs or symptoms. Please see discharge instructions. Patient expresses understanding.   ***

## 2021-04-04 NOTE — Telephone Encounter (Signed)
Pt had a recent ultrasound of her wrist showing only a hematoma on 9/22. She was advised to come in to evaluate. Pt is in the process of moving and does not want to come in unless necessary because they only have one vehicle at the moment. She did say that she is doing much better and that it has gone down a lot. She can hardly find it now. Discussed with Dr. Estanislado Pandy. Pt was advised to go to f/u with PCP if needed. Pt agreed. AW

## 2021-04-04 NOTE — Patient Instructions (Addendum)
Good to see you today.  You had a L knee Orthovisc injection 2/3.  Call or go to the ER if you develop a large red swollen joint with extreme pain or oozing puss.   Use compression on your L calf/lower leg.  I recommend you obtained a compression sleeve to help with your joint problems. There are many options on the market however I recommend obtaining a calf Body Helix compression sleeve.  You can find information (including how to appropriate measure yourself for sizing) can be found at www.Body http://www.lambert.com/.  Many of these products are health savings account (HSA) eligible.   You can use the compression sleeve at any time throughout the day but is most important to use while being active as well as for 2 hours post-activity.   It is appropriate to ice following activity with the compression sleeve in place.   Follow-up in 1 week for the last Orthovisc injection in the series.  Start PT as arranged.

## 2021-04-04 NOTE — Progress Notes (Signed)
I, Wendy Poet, LAT, ATC, am serving as scribe for Dr. Lynne Leader.  Sheila Shields is a 86 y.o. female who presents to [place] at [facility] today for f/u of L calf pain that she injured when she tripped on the sidewalk and strained her L lateral calf and for L knee Orthovisc 2/3.  She was last seen by Dr. Georgina Snell on 03/29/21 and was provided a HEP focusing on L ankle AROM and strengthening.  Today, pt reports that her L calf and lower leg pain worsened yesterday into today.  She still locates the majority of per pain to her L post-lat calf.  Her swelling and discoloration has increased and is now at her mid to distal lower leg.  She con't to take Tylenol and Tramadol.  Diagnostic testing: L knee XR- 07/14/20   Pertinent review of systems: No fevers or chills  Relevant historical information: On clopidogrel.  Moving to retirement community this week where physical therapy is scheduled to start.   Exam:  BP 138/80 (BP Location: Right Arm, Patient Position: Sitting, Cuff Size: Normal)   Pulse 86   Ht 5\' 5"  (1.651 m)   Wt 189 lb 6.4 oz (85.9 kg)   SpO2 91%   BMI 31.52 kg/m  General: Well Developed, well nourished, and in no acute distress.   MSK: Left leg swollen with bruising distal to gastrocnemius. Tender palpation gastrocnemius medial head. Antalgic gait.    Lab and Radiology Results  Orthovisc left knee 2/3 Procedure: Real-time Ultrasound Guided Injection of left knee superior lateral patellar space Device: Philips Affiniti 50G Images permanently stored and available for review in PACS Verbal informed consent obtained.  Discussed risks and benefits of procedure. Warned about infection bleeding damage to structures skin hypopigmentation and fat atrophy among others. Patient expresses understanding and agreement Time-out conducted.   Noted no overlying erythema, induration, or other signs of local infection.   Skin prepped in a sterile fashion.   Local  anesthesia: Topical Ethyl chloride.   With sterile technique and under real time ultrasound guidance: Orthovisc 30 mg injected into knee joint. Fluid seen entering the joint capsule.   Completed without difficulty   Advised to call if fevers/chills, erythema, induration, drainage, or persistent bleeding.   Images permanently stored and available for review in the ultrasound unit.  Impression: Technically successful ultrasound guided injection. Lot number: [id number]   Diagnostic Limited MSK Ultrasound of: Left calf Significant hypoechoic structure/hematoma deep to gastrocnemius from muscle tendinous junction to knee at medial gastrocnemius measuring about 1.5 cm in depth. Distal gastrocnemius medial head muscle tendinous junction with some swirling muscle fiber indicating tear of the medial gastrocnemius and at the muscle tendinous junction. Overall the medial head gastrocnemius muscle is intact. Achilles tendon is intact. Distally hypoechoic fluid tracking within subcutaneous tissue consistent with edema. Impression: Large hematoma deep to gastrocnemius muscle at the medial head. Indication of medial gastrocnemius tear or muscle tendinous junction. Lower leg edema     Assessment and Plan: 86 y.o. female with  Left knee DJD.  Plan for Orthovisc injection left knee 2/3.  Medial head gastrocnemius muscle tear.  1 week follow-up.  Clinically somewhat worsening.  Stressed the importance of compression.  Recommend compression stocking and body helix compression sleeve.  Reviewed home exercise program.  Patient is scheduled to start physical therapy late this week or early next week.  Return in 1 week for recheck and get scheduled for knee injection.   PDMP reviewed during  this encounter. Orders Placed This Encounter  Procedures   [place] LIMITED JOINT SPACE STRUCTURES LOW LEFT(NO LINKED CHARGES)    Order Specific Question:   Reason for Exam (SYMPTOM  OR DIAGNOSIS REQUIRED)    Answer:   L knee  pain    Order Specific Question:   Preferred imaging location?    Answer:   [place]   Meds ordered this encounter  Medications   traMADol (ULTRAM) 50 MG tablet    Sig: TAKE ONE TABLET BY MOUTH EVERY 8 HOURS AS NEEDED FOR PAIN FOR UP TO 5 DAYS (DO NOT DRIVE FOR 8 HOURS AFTER TAKING)    Dispense:  15 tablet    Refill:  0     Discussed warning signs or symptoms. Please see discharge instructions. Patient expresses understanding.   The above documentation has been reviewed and is accurate and complete Lynne Leader, M.D.

## 2021-04-05 ENCOUNTER — Ambulatory Visit: Payer: Medicare HMO | Admitting: Family Medicine

## 2021-04-12 ENCOUNTER — Encounter: Payer: Self-pay | Admitting: Family Medicine

## 2021-04-12 ENCOUNTER — Ambulatory Visit: Payer: Medicare HMO | Admitting: Family Medicine

## 2021-04-12 ENCOUNTER — Ambulatory Visit: Payer: Self-pay

## 2021-04-12 ENCOUNTER — Other Ambulatory Visit: Payer: Self-pay

## 2021-04-12 VITALS — BP 142/86 | HR 92 | Ht 65.0 in | Wt 189.0 lb

## 2021-04-12 DIAGNOSIS — G8929 Other chronic pain: Secondary | ICD-10-CM

## 2021-04-12 DIAGNOSIS — M25562 Pain in left knee: Secondary | ICD-10-CM | POA: Diagnosis not present

## 2021-04-12 DIAGNOSIS — M1712 Unilateral primary osteoarthritis, left knee: Secondary | ICD-10-CM | POA: Diagnosis not present

## 2021-04-12 DIAGNOSIS — S86112D Strain of other muscle(s) and tendon(s) of posterior muscle group at lower leg level, left leg, subsequent encounter: Secondary | ICD-10-CM

## 2021-04-12 NOTE — Patient Instructions (Addendum)
Thank you for coming in today.   You received the 3rd Orthovisc injection today. Seek immediate medical attention if the joint becomes red, extremely painful, or is oozing fluid.   Continue PT  Continue wearing the compression wrap on your calf.  Recheck back in 2-3 weeks.

## 2021-04-12 NOTE — Progress Notes (Signed)
I, Wendy Poet, LAT, ATC, am serving as scribe for Dr. Lynne Leader.  Sheila Shields is a 86 y.o. female who presents to [place] at [facility] today for f/u of L calf strain and for L knee Orthovisc 3/3.  She was last seen by Dr. Georgina Snell on 04/04/21 and noted worsening L calf pain and swelling/discoloration.  She was provided w/ a compression wrap and advised in how to apply compression.  She was advised to con't w/ plan of attending PT at the [facility] [employer] [facility].  Today, pr reports some improvement in calf, but it's still painful. Pt describes pain as a "dull ache." Pt had her first PT session yesterday and was given addition home exercises. Pt has plans to have 2 PT sessions/week. Pt reports she has been staying off her feet as much as possible and has been compliant in wearing the compression wrap.  Diagnostic testing: L knee XR- 07/14/20  Pertinent review of systems: No fevers or chills  Relevant historical information: COPD.  Hypertension.  History of breast cancer. On clopidogrel.  Exam:  BP (!) 142/86   Pulse 92   Ht 5\' 5"  (1.651 m)   Wt 189 lb (85.7 kg)   SpO2 (!) 84%   BMI 31.45 kg/m  General: Well Developed, well nourished, and in no acute distress.   MSK: Left knee mild effusion normal motion with crepitation nontender. Left calf enlarged less bruising today. Tender palpation mildly. Normal ankle motion.  Intact strength to foot plantarflexion.    Lab and Radiology Results   Orthovisc injection left knee 3/3 Procedure: Real-time Ultrasound Guided Injection of left knee superior lateral patellar space Device: Philips Affiniti 50G Images permanently stored and available for review in PACS Verbal informed consent obtained.  Discussed risks and benefits of procedure. Warned about infection bleeding damage to structures skin hypopigmentation and fat atrophy among others. Patient expresses understanding and agreement Time-out conducted.   Noted no  overlying erythema, induration, or other signs of local infection.   Skin prepped in a sterile fashion.   Local anesthesia: Topical Ethyl chloride.   With sterile technique and under real time ultrasound guidance: Orthovisc 30 mg injected into knee joint. Fluid seen entering the joint capsule.   Completed without difficulty   Advised to call if fevers/chills, erythema, induration, drainage, or persistent bleeding.   Images permanently stored and available for review in the ultrasound unit.  Impression: Technically successful ultrasound guided injection.   Lot #[id number]    Assessment and Plan: 86 y.o. female with left calf strain.  Improving with compression and PT.  Plan to continue PT and recheck in 2-3 weeks.  Recommend compression sleeve if possible.  Left knee arthritis and chronic pain Orthovisc injection scheduled today.   PDMP not reviewed this encounter. Orders Placed This Encounter  Procedures   [place] LIMITED JOINT SPACE STRUCTURES LOW LEFT(NO LINKED CHARGES)    Standing Status:   Future    Number of Occurrences:   1    Standing Expiration Date:   10/11/2021    Order Specific Question:   Reason for Exam (SYMPTOM  OR DIAGNOSIS REQUIRED)    Answer:   left knee pain    Order Specific Question:   Preferred imaging location?    Answer:   [place]   No orders of the defined types were placed in this encounter.    Discussed warning signs or symptoms. Please see discharge instructions. Patient expresses understanding.  The above documentation has been reviewed and is accurate and complete Lynne Leader, M.D.

## 2021-04-28 ENCOUNTER — Other Ambulatory Visit: Payer: Self-pay | Admitting: Family Medicine

## 2021-04-28 ENCOUNTER — Other Ambulatory Visit: Payer: Self-pay | Admitting: Hematology

## 2021-05-03 ENCOUNTER — Encounter: Payer: Self-pay | Admitting: Family Medicine

## 2021-05-03 ENCOUNTER — Other Ambulatory Visit: Payer: Self-pay

## 2021-05-03 ENCOUNTER — Ambulatory Visit: Payer: Medicare HMO | Admitting: Family Medicine

## 2021-05-03 VITALS — BP 132/72 | HR 77 | Ht 65.0 in | Wt 184.4 lb

## 2021-05-03 DIAGNOSIS — S86112D Strain of other muscle(s) and tendon(s) of posterior muscle group at lower leg level, left leg, subsequent encounter: Secondary | ICD-10-CM

## 2021-05-03 NOTE — Progress Notes (Signed)
   I, Wendy Poet, LAT, ATC, am serving as scribe for Dr. Lynne Leader.  Sheila Shields is a 86 y.o. female who presents to [place] at [facility] today for f/u L calf strain and chronic L knee pain. Pt was last seen by Dr. Georgina Snell on 04/12/21 and completed the Orthovisc series and was advised to cont PT and use a compression sleeve on her lower leg. Today, pt reports that her L calf is feeling 90% improved.  She con't to do PT at her assisted living facility 2x/week.   She also notes that her L knee is feeling well too.  Dx imaging: 07/14/20 L knee XR  Pertinent review of systems: No fevers or chills  Relevant historical information: History of breast cancer.   Exam:  BP 132/72 (BP Location: Right Arm, Patient Position: Sitting, Cuff Size: Normal)   Pulse 77   Ht 5\' 5"  (1.651 m)   Wt 184 lb 6.4 oz (83.6 kg)   SpO2 93%   BMI 30.69 kg/m  General: Well Developed, well nourished, and in no acute distress.   MSK: Left calf normal.  Nontender normal motion normal gait normal strength.       Assessment and Plan: 86 y.o. female with left calf pain secondary to gastrocnemius muscle strain/tear.  Significantly improved now.  Plan to finish out physical therapy and continue home exercise program.  Recheck as needed.    Discussed warning signs or symptoms. Please see discharge instructions. Patient expresses understanding.   The above documentation has been reviewed and is accurate and complete Lynne Leader, M.D.

## 2021-05-03 NOTE — Patient Instructions (Signed)
Good to see you today.  Glad you're feeling so much better.  Keep up the good work and ok to d/c Physical Therapy whenever they recommend.  Follow-up as needed.

## 2021-05-10 ENCOUNTER — Ambulatory Visit: Payer: Medicare HMO | Admitting: Family Medicine

## 2021-05-16 DIAGNOSIS — M6281 Muscle weakness (generalized): Secondary | ICD-10-CM | POA: Diagnosis not present

## 2021-05-20 DIAGNOSIS — C50412 Malignant neoplasm of upper-outer quadrant of left female breast: Secondary | ICD-10-CM | POA: Diagnosis not present

## 2021-05-20 DIAGNOSIS — Z170 Estrogen receptor positive status [ER+]: Secondary | ICD-10-CM | POA: Diagnosis not present

## 2021-05-26 ENCOUNTER — Other Ambulatory Visit: Payer: Self-pay

## 2021-05-26 ENCOUNTER — Ambulatory Visit (INDEPENDENT_AMBULATORY_CARE_PROVIDER_SITE_OTHER): Payer: Medicare HMO | Admitting: Family Medicine

## 2021-05-26 ENCOUNTER — Encounter: Payer: Self-pay | Admitting: Family Medicine

## 2021-05-26 ENCOUNTER — Telehealth: Payer: Self-pay

## 2021-05-26 VITALS — BP 126/60 | HR 60 | Temp 97.7°F | Ht 65.0 in | Wt 182.8 lb

## 2021-05-26 DIAGNOSIS — K219 Gastro-esophageal reflux disease without esophagitis: Secondary | ICD-10-CM | POA: Diagnosis not present

## 2021-05-26 DIAGNOSIS — R7309 Other abnormal glucose: Secondary | ICD-10-CM

## 2021-05-26 DIAGNOSIS — G4700 Insomnia, unspecified: Secondary | ICD-10-CM

## 2021-05-26 DIAGNOSIS — E782 Mixed hyperlipidemia: Secondary | ICD-10-CM | POA: Diagnosis not present

## 2021-05-26 DIAGNOSIS — I350 Nonrheumatic aortic (valve) stenosis: Secondary | ICD-10-CM | POA: Diagnosis not present

## 2021-05-26 DIAGNOSIS — I10 Essential (primary) hypertension: Secondary | ICD-10-CM | POA: Diagnosis not present

## 2021-05-26 DIAGNOSIS — Z79899 Other long term (current) drug therapy: Secondary | ICD-10-CM | POA: Diagnosis not present

## 2021-05-26 DIAGNOSIS — J410 Simple chronic bronchitis: Secondary | ICD-10-CM | POA: Diagnosis not present

## 2021-05-26 DIAGNOSIS — J45991 Cough variant asthma: Secondary | ICD-10-CM | POA: Diagnosis not present

## 2021-05-26 DIAGNOSIS — J300 Vasomotor rhinitis: Secondary | ICD-10-CM | POA: Diagnosis not present

## 2021-05-26 LAB — CBC WITH DIFFERENTIAL/PLATELET
Basophils Absolute: 0.1 10*3/uL (ref 0.0–0.1)
Basophils Relative: 0.7 % (ref 0.0–3.0)
Eosinophils Absolute: 0.3 10*3/uL (ref 0.0–0.7)
Eosinophils Relative: 3.6 % (ref 0.0–5.0)
HCT: 40.7 % (ref 36.0–46.0)
Hemoglobin: 13.6 g/dL (ref 12.0–15.0)
Lymphocytes Relative: 10.5 % — ABNORMAL LOW (ref 12.0–46.0)
Lymphs Abs: 1 10*3/uL (ref 0.7–4.0)
MCHC: 33.6 g/dL (ref 30.0–36.0)
MCV: 92.9 fl (ref 78.0–100.0)
Monocytes Absolute: 0.9 10*3/uL (ref 0.1–1.0)
Monocytes Relative: 9 % (ref 3.0–12.0)
Neutro Abs: 7.3 10*3/uL (ref 1.4–7.7)
Neutrophils Relative %: 76.2 % (ref 43.0–77.0)
Platelets: 233 10*3/uL (ref 150.0–400.0)
RBC: 4.37 Mil/uL (ref 3.87–5.11)
RDW: 13.5 % (ref 11.5–15.5)
WBC: 9.6 10*3/uL (ref 4.0–10.5)

## 2021-05-26 LAB — COMPREHENSIVE METABOLIC PANEL
ALT: 10 U/L (ref 0–35)
AST: 18 U/L (ref 0–37)
Albumin: 3.8 g/dL (ref 3.5–5.2)
Alkaline Phosphatase: 43 U/L (ref 39–117)
BUN: 15 mg/dL (ref 6–23)
CO2: 31 mEq/L (ref 19–32)
Calcium: 9.5 mg/dL (ref 8.4–10.5)
Chloride: 102 mEq/L (ref 96–112)
Creatinine, Ser: 0.87 mg/dL (ref 0.40–1.20)
GFR: 61.22 mL/min (ref >60.00)
Glucose, Bld: 94 mg/dL (ref 70–99)
Potassium: 4 mEq/L (ref 3.5–5.1)
Sodium: 141 mEq/L (ref 135–145)
Total Bilirubin: 0.5 mg/dL (ref 0.2–1.2)
Total Protein: 6.4 g/dL (ref 6.0–8.3)

## 2021-05-26 LAB — VITAMIN B12: Vitamin B-12: 914 pg/mL — ABNORMAL HIGH (ref 211–911)

## 2021-05-26 LAB — LDL CHOLESTEROL, DIRECT: Direct LDL: 39 mg/dL

## 2021-05-26 LAB — HEMOGLOBIN A1C: Hgb A1c MFr Bld: 5.9 % (ref 4.6–6.5)

## 2021-05-26 MED ORDER — AZITHROMYCIN 250 MG PO TABS: ORAL_TABLET | ORAL | 0 refills | Status: DC

## 2021-05-26 MED ORDER — PREDNISONE 50 MG PO TABS: 50.0000 mg | ORAL_TABLET | Freq: Every day | ORAL | 0 refills | Status: DC

## 2021-05-26 NOTE — Telephone Encounter (Signed)
Medications have been resent.

## 2021-05-26 NOTE — Patient Instructions (Addendum)
Health Maintenance Due  Topic Date Due   COVID-19 Vaccine (6 - Booster for [employer] series) - Consider getting Omicron/Bivalent booster at your local pharmacy - we do not have this available in the office today. Please let us know when you have received this vaccination.  02/01/2021   Please stop by lab before you go If you have mychart- we will send your results within 3 business days of [place] receiving them.  If you do not have mychart- we will call you about results within 5 business days of [place] receiving them.  *please also note that you will see labs on mychart as soon as they post. I will later go in and write notes on them- will say "notes from Dr. Yong Channel"  Start Azithromycin today and start Prednisone 50 mg tomorrow. If you are nor improving within the next 7-10 days please let me know.  Consider purchasing over-the-counter Pepcid to see if this will help with your reflux when settled.  Congratulations on being 32 years sober from alcohol!  Please continue to monitor your weight loss.  Recommended follow up: Return in about 6 months (around 11/23/2021) for physical or sooner if needed.

## 2021-05-26 NOTE — Progress Notes (Signed)
Phone [phone] In person visit   Subjective:   Sheila Shields is a 86 y.o. year old very pleasant female patient who presents for/with See problem oriented charting Chief Complaint  Patient presents with   Follow-up   Hyperlipidemia   Hypertension   Cough    Pt c/o cough for a few weeks and coughing up green mucous and is fatigued all the time.   This visit occurred during the SARS-CoV-2 public health emergency.  Safety protocols were in place, including screening questions prior to the visit, additional usage of staff PPE, and extensive cleaning of exam room while observing appropriate contact time as indicated for disinfecting solutions.   Past Medical History-  Patient Active Problem List   Diagnosis Date Noted   Mild aortic stenosis 05/24/2020    Priority: High   Brain aneurysm 12/30/2018    Priority: High   Chronic cough 10/11/2015    Priority: High   COPD (chronic obstructive pulmonary disease) ([place]) 11/18/2014    Priority: High   HYPERGLYCEMIA 08/27/2007    Priority: Medium    Gastroesophageal reflux disease 05/01/2007    Priority: Medium    Hyperlipemia 12/17/2006    Priority: Medium    Essential hypertension 12/17/2006    Priority: Medium    Former smoker 05/22/2019    Priority: Low   Aortic atherosclerosis ([place]) 04/11/2018    Priority: Low   Allergic rhinitis 11/09/2017    Priority: Low   Arm mass, left 04/16/2017    Priority: Low   Iron deficiency anemia, unspecified 01/20/2011    Priority: Low   Arthropathy 02/23/2010    Priority: Low   Alcoholism in remission ([place]) 08/27/2007    Priority: Low   Insomnia 05/24/2020   Malignant neoplasm of upper-outer quadrant of left breast in female, estrogen receptor positive ([place]) 02/12/2020    Medications- reviewed and updated Current Outpatient Medications  Medication Sig Dispense Refill   acetaminophen (TYLENOL) 650 MG CR tablet Take 1,300 mg by mouth 2 (two) times daily.     albuterol (PROAIR HFA) 108  (90 Base) MCG/ACT inhaler INHALE 2 PUFFS EVERY 4 HOURS AS NEEDED FOR COUGHING SPELLS 18 g 5   aspirin 325 MG tablet Take 1 tablet (325 mg total) by mouth daily. 30 tablet 3   azithromycin (ZITHROMAX) 250 MG tablet Take 2 tabs on day 1, then 1 tab daily until finished 6 tablet 0   clopidogrel (PLAVIX) 75 MG tablet Take 0.5 tablets (37.5 mg total) by mouth daily. 46 tablet 3   Coenzyme Q10 (COQ-10 PO) Take 1 capsule by mouth daily.     Cyanocobalamin (VITAMIN B-12 PO) Take 1 tablet by mouth daily.     diclofenac Sodium (VOLTAREN) 1 % GEL Apply 2 g topically 4 (four) times daily. (Patient taking differently: Apply 2 g topically 4 (four) times daily as needed (pain).) 100 g 11   fexofenadine (ALLEGRA) 180 MG tablet Take 180 mg by mouth daily.     fluticasone (FLONASE) 50 MCG/ACT nasal spray Place 2 sprays into both nostrils daily. 16 g 2   hydrochlorothiazide (HYDRODIURIL) 25 MG tablet Take 0.5 tablets (12.5 mg total) by mouth daily. 46 tablet 3   Multiple Vitamin (MULTIVITAMIN WITH MINERALS) TABS tablet Take 1 tablet by mouth daily.     pantoprazole (PROTONIX) 20 MG tablet TAKE ONE TABLET BY MOUTH DAILY 90 tablet 3   potassium chloride SA (KLOR-CON M20) 20 MEQ tablet Take 1 tablet (20 mEq total) by mouth every other day. 46 tablet  3   predniSONE (DELTASONE) 50 MG tablet Take 1 tablet (50 mg total) by mouth daily with breakfast. 5 tablet 0   Propylene Glycol (SYSTANE BALANCE OP) Place 1 drop into both eyes every 6 (six) hours as needed (dry eyes).     rosuvastatin (CRESTOR) 20 MG tablet TAKE ONE TABLET BY MOUTH DAILY 90 tablet 3   tamoxifen (NOLVADEX) 20 MG tablet TAKE ONE TABLET BY MOUTH DAILY 90 tablet 1   traMADol (ULTRAM) 50 MG tablet TAKE ONE TABLET BY MOUTH EVERY 8 HOURS AS NEEDED FOR PAIN FOR UP TO 5 DAYS (DO NOT DRIVE FOR 8 HOURS AFTER TAKING) 15 tablet 0   traZODone (DESYREL) 50 MG tablet TAKE 0.5-1 TABLETS (25-50 MG TOTAL) BY MOUTH AT BEDTIME AS NEEDED FOR SLEEP. (Patient taking differently:  Take 50 mg by mouth at bedtime.) 30 tablet 3   Turmeric (QC TUMERIC COMPLEX PO) Take 1 tablet by mouth daily.     VITAMIN D PO Take 1 capsule by mouth daily.     No current facility-administered medications for this visit.     Objective:  BP 126/60   Pulse 60   Temp 97.7 F (36.5 C)   Ht 5\' 5"  (1.651 m)   Wt 182 lb 12.8 oz (82.9 kg)   SpO2 91%   BMI 30.42 kg/m  Gen: NAD, resting comfortably CV: RRR no murmurs rubs or gallops Lungs: CTAB no crackles, wheeze, rhonchi Ext: trace edema Skin: warm, dry      Assessment and Plan   #Cough with increased sputum production and patient with COPD concerning for exacerbation S: Cough with at least 3 weeks with sputum production typically green when usually clear.  some frontal sinus pressure. Chronic cough has increased. Some more wheezing- albuterol more frequent twice a day.  -saw Dr. Orvil Feil this Am- started on trelegy (they discussed I would treat her) A/P: COPD exacerbation based on increased sputum production and purulence as well as increased albuterol usage-we opted to treat with azithromycin and prednisone and she agrees to follow-up if not improving within the next 7 to 10 days or worsens.   -appetite lower at [place]- losing some weight as not snacking as much  #breast cancer left breast discovered 02/10/20-  Dr. Barry Dienes surgeon and Dr. Burr Medico alternates visits every 6 months. On chronic tamoxifen after definitive therapy (other than has hot flashes)  #History of brain aneurysm-follows with Dr. Estanislado Pandy- half dose plavix, full dose aspirin. MRA had been stable- will have another catheter angiography in 6 months from September.   #hypertension S: medication: Hydrochlorothiazide 12.5 mg-also takes potassium with this every other day. Reduced from 25 mg on 11/23/20 BP Readings from Last 3 Encounters:  05/26/21 126/60  05/03/21 132/72  04/12/21 (!) 142/86  A/P:  Controlled. Continue current medications.   -check potassium on  every other day dosing  #hyperlipidemia S: Medication: Rosuvastatin 20 mg daily-previously on simvastatin and had myalgias and arthralgias. Also did not tolerate atorvastatin Lab Results  Component Value Date   CHOL 131 11/23/2020   HDL 52.50 11/23/2020   LDLCALC 42 11/23/2020   LDLDIRECT 55 05/24/2020   TRIG 185.0 (H) 11/23/2020   CHOLHDL 3 11/23/2020  A/P: Lipids have been very well controlled-continue current medication  # GERD S:Medication: Protonix 20 mg. -Discussed trial of Pepcid again today- hasnt tried   A/P: doing well- when tings settle down could try pepcid   -B12 has been low normal on prior checks   #COPD-was told Dr. Lamonte Sakai in  the past- seeing Dr. Orvil Feil as well - no clear allergies on testing per report.   #Anemia- patient with history of anemia- off iron- will monitor CBC  #Insomnia S: Patient with intermittent issues for 25 years with sleep. Trazodone helps with transitions like moving to [place] -Benzodiazepines and Ambien with alcoholism history -Could also consider CBT-I A/P: doing well recently and not needing meds   #Mild aortic stenosis discovered 06/29/2020- we will plan to repeat every 2 to 3 years   #Joint pain and history sciatica- turmeric helpful, sparing tramadol  for joint pain (used just before she moved)  #alcoholism in remissoin-39 years sober in 2022- will be 42 years on may 12th   # Hyperglycemia/insulin resistance/prediabetes-peak A1c 6.4 in 2018-weight loss since that time S: Medication: none and losing weight-  Lab Results  Component Value Date   HGBA1C 5.8 11/19/2019   HGBA1C 5.8 05/22/2019   HGBA1C 6.0 11/14/2018  A/P: hopefully improved- update a1c - down 12 lbs from last visit  Recommended follow up: Return in about 6 months (around 11/23/2021) for physical or sooner if needed. Future Appointments  Date Time Provider [place]  08/11/2021  8:45 AM [facility] HEALTH COACH [facility]  09/12/2021 10:00 AM [facility]  [facility] None  09/12/2021 10:30 AM Alla Feeling, NP [facility] None   Lab/Order associations:   ICD-10-CM   1. Essential hypertension  I10 CBC with Differential/Platelet    Comprehensive metabolic panel    LDL cholesterol, direct    2. Mixed hyperlipidemia  E78.2 CBC with Differential/Platelet    Comprehensive metabolic panel    LDL cholesterol, direct    3. Gastroesophageal reflux disease, unspecified whether esophagitis present  K21.9     4. Simple chronic bronchitis (HCC)  J41.0     5. Insomnia, unspecified type  G47.00     6. HYPERGLYCEMIA  R73.09 Hemoglobin A1c    7. Mild aortic stenosis  I35.0     8. High risk medication use  Z79.899 Vitamin B12      Meds ordered this encounter  Medications   azithromycin (ZITHROMAX) 250 MG tablet    Sig: Take 2 tabs on day 1, then 1 tab daily until finished    Dispense:  6 tablet    Refill:  0   predniSONE (DELTASONE) 50 MG tablet    Sig: Take 1 tablet (50 mg total) by mouth daily with breakfast.    Dispense:  5 tablet    Refill:  0   I,Harris Phan,acting as a scribe for Garret Reddish, MD.,have documented all relevant documentation on the behalf of Garret Reddish, MD,as directed by  Garret Reddish, MD while in the presence of Garret Reddish, MD.  I, Garret Reddish, MD, have reviewed all documentation for this visit. The documentation on 05/26/21 for the exam, diagnosis, procedures, and orders are all accurate and complete.  Return precautions advised.  Garret Reddish, MD

## 2021-05-26 NOTE — Telephone Encounter (Signed)
Pt called stated that Dr Yong Channel saw her today and prescribed a Z Pack. She called the pharmacy and the order never went through. Pt wants to know if another can be placed. Please Advise.

## 2021-06-08 ENCOUNTER — Encounter: Payer: Self-pay | Admitting: Family Medicine

## 2021-06-10 MED ORDER — PREDNISONE 50 MG PO TABS: 50.0000 mg | ORAL_TABLET | Freq: Every day | ORAL | 0 refills | Status: DC

## 2021-06-10 MED ORDER — AMOXICILLIN-POT CLAVULANATE 875-125 MG PO TABS: 1.0000 | ORAL_TABLET | Freq: Two times a day (BID) | ORAL | 0 refills | Status: AC

## 2021-06-10 NOTE — Telephone Encounter (Signed)
Pt called regarding Prednisone and Augementin. Pt wants to know if Dr Yong Channel will call in prescription. Please Advise.

## 2021-06-13 ENCOUNTER — Encounter: Payer: Self-pay | Admitting: Family Medicine

## 2021-06-20 ENCOUNTER — Encounter: Payer: Self-pay | Admitting: Family Medicine

## 2021-06-28 DIAGNOSIS — R053 Chronic cough: Secondary | ICD-10-CM | POA: Diagnosis not present

## 2021-06-28 DIAGNOSIS — J449 Chronic obstructive pulmonary disease, unspecified: Secondary | ICD-10-CM | POA: Diagnosis not present

## 2021-06-28 DIAGNOSIS — R093 Abnormal sputum: Secondary | ICD-10-CM | POA: Diagnosis not present

## 2021-06-28 DIAGNOSIS — Z8709 Personal history of other diseases of the respiratory system: Secondary | ICD-10-CM | POA: Diagnosis not present

## 2021-07-05 DIAGNOSIS — H109 Unspecified conjunctivitis: Secondary | ICD-10-CM | POA: Diagnosis not present

## 2021-07-05 DIAGNOSIS — R058 Other specified cough: Secondary | ICD-10-CM | POA: Diagnosis not present

## 2021-07-05 DIAGNOSIS — J441 Chronic obstructive pulmonary disease with (acute) exacerbation: Secondary | ICD-10-CM | POA: Diagnosis not present

## 2021-08-03 ENCOUNTER — Telehealth: Payer: Self-pay | Admitting: Family Medicine

## 2021-08-03 ENCOUNTER — Other Ambulatory Visit: Payer: Self-pay

## 2021-08-03 MED ORDER — ROSUVASTATIN CALCIUM 20 MG PO TABS: 20.0000 mg | ORAL_TABLET | Freq: Every day | ORAL | 3 refills | Status: DC

## 2021-08-03 MED ORDER — TRAZODONE HCL 50 MG PO TABS: 25.0000 mg | ORAL_TABLET | Freq: Every evening | ORAL | 3 refills | Status: DC | PRN

## 2021-08-03 MED ORDER — PANTOPRAZOLE SODIUM 20 MG PO TBEC: 20.0000 mg | DELAYED_RELEASE_TABLET | Freq: Every day | ORAL | 3 refills | Status: DC

## 2021-08-03 MED ORDER — CLOPIDOGREL BISULFATE 75 MG PO TABS: 37.5000 mg | ORAL_TABLET | Freq: Every day | ORAL | 3 refills | Status: AC

## 2021-08-03 MED ORDER — HYDROCHLOROTHIAZIDE 25 MG PO TABS: 12.5000 mg | ORAL_TABLET | Freq: Every day | ORAL | 3 refills | Status: DC

## 2021-08-03 NOTE — Telephone Encounter (Signed)
Pt had switched to previously switched to [facility] but was unhappy with the store. She is now trying to switch to [facility], [facility] is refusing to switch her prescriptions and she only has enough until Friday. She is asking for help to get these transferred.

## 2021-08-03 NOTE — Telephone Encounter (Signed)
I was able to send all medications to the [facility] as requested.

## 2021-08-11 ENCOUNTER — Ambulatory Visit: Payer: Medicare HMO

## 2021-08-12 ENCOUNTER — Ambulatory Visit (INDEPENDENT_AMBULATORY_CARE_PROVIDER_SITE_OTHER): Payer: Medicare HMO

## 2021-08-12 ENCOUNTER — Other Ambulatory Visit: Payer: Self-pay

## 2021-08-12 VITALS — BP 130/76 | HR 77 | Temp 98.6°F | Wt 177.4 lb

## 2021-08-12 DIAGNOSIS — Z0000 Encounter for general adult medical examination without abnormal findings: Secondary | ICD-10-CM

## 2021-08-12 NOTE — Progress Notes (Signed)
Subjective:   Sheila Shields is a 87 y.o. female who presents for Medicare Annual (Subsequent) preventive examination.  Review of Systems     Cardiac Risk Factors include: advanced age (>66men, >11 women);dyslipidemia;hypertension     Objective:    Today's Vitals   08/12/21 0932 08/12/21 0956  BP: 140/78 130/76  Pulse: 77   Temp: 98.6 F (37 C)   SpO2:  93%  Weight: 177 lb 6.4 oz (80.5 kg)    Body mass index is 29.52 kg/m.  Advanced Directives 08/12/2021 03/23/2021 03/17/2021 08/05/2020 04/20/2020 04/20/2020 03/17/2020  Does Patient Have a Medical Advance Directive? Yes Yes Yes Yes - Yes Yes  Type of [employer] [place];Living will Living will;Healthcare Power of Attorney Living will Living will [place];Living will  Does patient want to make changes to medical advance directive? - - - - No - Patient declined Yes (MAU/Ambulatory/Procedural Areas - Information given) No - Patient declined  Copy of [employer] in Chart? Yes - validated most recent copy scanned in chart (See row information) - - Yes - validated most recent copy scanned in chart (See row information) - Yes - validated most recent copy scanned in chart (See row information) Yes - validated most recent copy scanned in chart (See row information)  Would patient like information on creating a medical advance directive? - - - - No - Patient declined Yes (Inpatient - patient defers creating a medical advance directive and declines information at this time);Yes (MAU/Ambulatory/Procedural Areas - Information given) -  Pre-existing out of facility DNR order (yellow form or pink MOST form) - - - - - - -    Current Medications (verified) Outpatient Encounter Medications as of 08/12/2021  Medication Sig   acetaminophen (TYLENOL) 650 MG CR tablet Take 1,300 mg by mouth 2 (two) times daily.   albuterol (PROAIR HFA) 108 (90 Base) MCG/ACT inhaler  INHALE 2 PUFFS EVERY 4 HOURS AS NEEDED FOR COUGHING SPELLS   aspirin 325 MG tablet Take 1 tablet (325 mg total) by mouth daily.   clopidogrel (PLAVIX) 75 MG tablet Take 0.5 tablets (37.5 mg total) by mouth daily.   Coenzyme Q10 (COQ-10 PO) Take 1 capsule by mouth daily.   Cyanocobalamin (VITAMIN B-12 PO) Take 1 tablet by mouth daily.   diclofenac Sodium (VOLTAREN) 1 % GEL Apply 2 g topically 4 (four) times daily. (Patient taking differently: Apply 2 g topically 4 (four) times daily as needed (pain).)   fexofenadine (ALLEGRA) 180 MG tablet Take 180 mg by mouth daily.   fluticasone (FLONASE) 50 MCG/ACT nasal spray Place 2 sprays into both nostrils daily.   hydrochlorothiazide (HYDRODIURIL) 25 MG tablet Take 0.5 tablets (12.5 mg total) by mouth daily.   Multiple Vitamin (MULTIVITAMIN WITH MINERALS) TABS tablet Take 1 tablet by mouth daily.   pantoprazole (PROTONIX) 20 MG tablet Take 1 tablet (20 mg total) by mouth daily.   potassium chloride SA (KLOR-CON M20) 20 MEQ tablet Take 1 tablet (20 mEq total) by mouth every other day.   Propylene Glycol (SYSTANE BALANCE OP) Place 1 drop into both eyes every 6 (six) hours as needed (dry eyes).   rosuvastatin (CRESTOR) 20 MG tablet Take 1 tablet (20 mg total) by mouth daily.   tamoxifen (NOLVADEX) 20 MG tablet TAKE ONE TABLET BY MOUTH DAILY   traZODone (DESYREL) 50 MG tablet Take 0.5-1 tablets (25-50 mg total) by mouth at bedtime as needed for sleep.  Turmeric (QC TUMERIC COMPLEX PO) Take 1 tablet by mouth daily.   VITAMIN D PO Take 1 capsule by mouth daily.   traMADol (ULTRAM) 50 MG tablet TAKE ONE TABLET BY MOUTH EVERY 8 HOURS AS NEEDED FOR PAIN FOR UP TO 5 DAYS (DO NOT DRIVE FOR 8 HOURS AFTER TAKING) (Patient not taking: Reported on 08/12/2021)   [DISCONTINUED] azithromycin (ZITHROMAX) 250 MG tablet Take 2 tabs on day 1, then 1 tab daily until finishedTake 2 tabs on day 1, then 1 tab daily until finished   [DISCONTINUED] predniSONE (DELTASONE) 50 MG tablet  Take 1 tablet (50 mg total) by mouth daily with breakfast.   No facility-administered encounter medications on file as of 08/12/2021.    Allergies (verified) Alcohol, Codeine, and Dilaudid [hydromorphone hcl]   History: Past Medical History:  Diagnosis Date   Alcoholism in remission ([place]) 08/27/2007   Anemia    Arthritis    OA   Breast cancer ([place])    left breast   COPD 12/17/2006   Diverticulosis 03/27/2007   GERD 05/01/2007   HYPERGLYCEMIA 08/27/2007   HYPERLIPIDEMIA 12/17/2006   HYPERTENSION 12/17/2006   Mood disorder ([place])    hx anxiety and depression. meds short term during life transition   Pneumonia    AS A CHILD   Rectal polyp 03/27/2007   adenoma   SOB (shortness of breath) on exertion    per patient due to having COPD   TIA (transient ischemic attack)    Past Surgical History:  Procedure Laterality Date   ABDOMINAL HYSTERECTOMY  1978   fibroma   APPENDECTOMY  2002   BREAST EXCISIONAL BIOPSY Right 2003   negative   BREAST LUMPECTOMY WITH RADIOACTIVE SEED LOCALIZATION Left 03/23/2020   Procedure: LEFT BREAST LUMPECTOMY WITH RADIOACTIVE SEED LOCALIZATION;  Surgeon: [name], MD;  Location: [facility];  Service: General;  Laterality: Left;  RNFA   CARPAL TUNNEL RELEASE Left 2010 or 2011   CATARACT EXTRACTION W/ INTRAOCULAR LENS  IMPLANT, BILATERAL Bilateral    COLONOSCOPY W/ POLYPECTOMY  03/27/2007; n7/19/2012   2008: 4 mm adenoma, diverticulosis 2012: ileitis ? NSAID - likely, 2-3 mm cecal polyp LYMPHOID FOLLICLE, diverticulosis   ESOPHAGOGASTRODUODENOSCOPY  01/26/2011   reflux esophagitis, colonscopy done also   HAMMER TOE SURGERY  2008   left foot   HERNIA REPAIR Right 1996?   IR ANGIO INTRA EXTRACRAN SEL COM CAROTID INNOMINATE UNI R MOD SED  11/28/2018   IR ANGIO INTRA EXTRACRAN SEL COM CAROTID INNOMINATE UNI R MOD SED  03/17/2021   IR ANGIO INTRA EXTRACRAN SEL INTERNAL CAROTID UNI R MOD SED  12/30/2018   IR ANGIO VERTEBRAL SEL SUBCLAVIAN INNOMINATE UNI R MOD SED   11/28/2018   IR ANGIO VERTEBRAL SEL VERTEBRAL UNI R MOD SED  03/18/2021   IR ANGIOGRAM FOLLOW UP STUDY  12/30/2018   IR TRANSCATH/EMBOLIZ  12/30/2018   IR US GUIDE VASC ACCESS RIGHT  11/28/2018   IR US GUIDE VASC ACCESS RIGHT  03/17/2021   ORIF ANKLE FRACTURE Right 12/22/2014   Procedure: OPEN REDUCTION INTERNAL FIXATION (ORIF) ANKLE FRACTURE;  Surgeon: Susa Day, MD;  Location: [facility];  Service: Orthopedics;  Laterality: Right;   RADIOLOGY WITH ANESTHESIA N/A 12/30/2018   Procedure: RADIOLOGY WITH ANESTHESIA;  Surgeon: Luanne Bras, MD;  Location: [facility];  Service: Radiology;  Laterality: N/A;   SHOULDER OPEN ROTATOR CUFF REPAIR Left 03/06/2013   Procedure: LEFT ROTATOR CUFF REPAIR, SUBACROMIAL DECOMPRESSION, PATCH GRAFT, MANIPULATION UNDER ANESTHESIA;  Surgeon: Johnn Hai, MD;  Location: [facility];  Service: Orthopedics;  Laterality: Left;   TONSILLECTOMY     Family History  Problem Relation Age of Onset   Throat cancer Mother        77   Heart attack Father 6   Idiopathic pulmonary fibrosis Brother 7   Cancer Brother        sarcoma   Heart disease Brother    Colon cancer Neg Hx    Esophageal cancer Neg Hx    Stomach cancer Neg Hx    Social History   Socioeconomic History   Marital status: Married    Spouse name: Not on file   Number of children: 2   Years of education: Not on file   Highest education level: Not on file  Occupational History   Occupation: retired  Tobacco Use   Smoking status: Former    Packs/day: 2.00    Years: 30.00    Pack years: 60.00    Types: Cigarettes    Quit date: 07/10/1984    Years since quitting: 37.1   Smokeless tobacco: Never  Vaping Use   Vaping Use: Never used  Substance and Sexual Activity   Alcohol use: No    Alcohol/week: 0.0 standard drinks    Comment: no alcoho since 1983   Drug use: No   Sexual activity: Not on file  Other Topics Concern   Not on file  Social History Narrative   Home Situation: lives with husband. 2  daughters, 2 step daughters, 6 grandkids. 2 greatgrandkids    Work or School: very active in [employer] and church      Spiritual Beliefs: Christian      Lifestyle:tries to walk; diet ok - denies any alcohol or tobacco use in many many years      Caffeine 1 cup coffee/day      Hobbies: news junkie, audio books      Left Handed      Lives in one story home   Social Determinants of Health   Financial Resource Strain: Low Risk    Difficulty of Paying Living Expenses: Not hard at all  Food Insecurity: No Food Insecurity   Worried About Charity fundraiser in the Last Year: Never true   Arboriculturist in the Last Year: Never true  Transportation Needs: No Transportation Needs   Lack of Transportation (Medical): No   Lack of Transportation (Non-Medical): No  Physical Activity: Inactive   Days of Exercise per Week: 0 days   Minutes of Exercise per Session: 0 min  Stress: No Stress Concern Present   Feeling of Stress : Not at all  Social Connections: Socially Integrated   Frequency of Communication with Friends and Family: More than three times a week   Frequency of Social Gatherings with Friends and Family: More than three times a week   Attends Religious Services: More than 4 times per year   Active Member of [employer] or Organizations: Yes   Attends Archivist Meetings: 1 to 4 times per year   Marital Status: Married    Tobacco Counseling Counseling given: Not Answered   Clinical Intake:  Pre-visit preparation completed: Yes  Pain : No/denies pain     BMI - recorded: 29.52 Nutritional Status: BMI 25 -29 Overweight Nutritional Risks: None Diabetes: No  How often do you need to have someone help you when you read instructions, pamphlets, or other written materials from your doctor or pharmacy?: 1 - Never  Diabetic?No  Interpreter Needed?: No  Information entered by :: Charlott Rakes, LPN   Activities of Daily Living In your present state of health, do you  have any difficulty performing the following activities: 08/12/2021  Hearing? Y  Comment slioght loss  Vision? N  Difficulty concentrating or making decisions? N  Walking or climbing stairs? N  Dressing or bathing? N  Comment sob with exertion  Doing errands, shopping? N  Preparing Food and eating ? N  Using the Toilet? N  In the past six months, have you accidently leaked urine? N  Do you have problems with loss of bowel control? N  Managing your Medications? N  Managing your Finances? N  Housekeeping or managing your Housekeeping? N  Some recent data might be hidden    Patient Care Team: Marin Olp, MD as PCP - General (Family Medicine) Pieter Partridge, DO as Consulting Physician (Neurology) Collene Gobble, MD as Consulting Physician (Pulmonary Disease) [place], P.A. as Consulting Physician Jarome Matin, MD as Consulting Physician (Dermatology) Luanne Bras, MD as Consulting Physician (Interventional Radiology) Mauro Kaufmann, RN as Oncology Nurse Navigator Rockwell [place], RN as Oncology Nurse Navigator Truitt Merle, MD as Consulting Physician (Hematology) [name], MD as Consulting Physician (General Surgery) Kyung Rudd, MD as Consulting Physician (Radiation Oncology) Alla Feeling, NP as Nurse Practitioner (Nurse Practitioner)  Indicate any recent Medical Services you may have received from other than [facility] providers in the past year (date may be approximate).     Assessment:   This is a routine wellness examination for Sheila Shields.  Hearing/Vision screen Hearing Screening - Comments:: Pt stated slight loss  Vision Screening - Comments:: Pt follows up with Dr Wyatt Portela for annual eye exams   Dietary issues and exercise activities discussed: Current Exercise Habits: The patient does not participate in regular exercise at present   Goals Addressed             This Visit's Progress    Patient Stated       None at this time         Depression Screen PHQ 2/9 Scores 08/12/2021 11/23/2020 08/05/2020 02/18/2020 08/11/2019 04/04/2019 02/26/2018  PHQ - 2 Score 0 0 0 0 0 0 0  PHQ- 9 Score - 0 - - - - -    Fall Risk Fall Risk  08/12/2021 11/23/2020 08/05/2020 02/23/2020 08/25/2019  Falls in the past year? 0 0 0 0 0  Number falls in past yr: 0 0 0 0 0  Injury with Fall? 0 0 0 0 0  Risk for fall due to : Impaired vision;Impaired balance/gait - Impaired vision;Impaired balance/gait - -  Risk for fall due to: Comment at times - slighty related to ankle pain - -  Follow up Falls prevention discussed - Falls prevention discussed - -    FALL RISK PREVENTION PERTAINING TO THE HOME:  Any stairs in or around the home? No  If so, are there any without handrails? No  Home free of loose throw rugs in walkways, pet beds, electrical cords, etc? Yes  Adequate lighting in your home to reduce risk of falls? Yes   ASSISTIVE DEVICES UTILIZED TO PREVENT FALLS:  Life alert? Yes  Use of a cane, walker or w/c? No  Grab bars in the bathroom? Yes  Shower chair or bench in shower? Yes  Elevated toilet seat or a handicapped toilet? Yes   TIMED UP AND GO:  Was the test performed? Yes .  Length of time to ambulate 10 feet: 10 sec.   Gait steady and fast without use of assistive device  Cognitive Function: MMSE - Mini Mental State Exam 02/22/2017  Not completed: (No Data)     6CIT Screen 08/12/2021 08/05/2020 04/04/2019  What Year? 0 points 0 points 0 points  What month? 0 points 0 points 0 points  What time? 0 points - 0 points  Count back from 20 0 points 0 points 0 points  Months in reverse 0 points 0 points 0 points  Repeat phrase 0 points 0 points 0 points  Total Score 0 - 0    Immunizations Immunization History  Administered Date(s) Administered   Fluad Quad(high Dose 65+) 04/04/2019, 03/22/2021   Influenza Split 04/09/2012   Influenza Whole 05/01/2007, 04/06/2008, 04/21/2009, 03/24/2010, 04/10/2011   Influenza, High Dose Seasonal PF  04/09/2015, 03/17/2016, 05/22/2016, 04/16/2017, 05/21/2017, 03/20/2018, 05/20/2018, 05/26/2019, 04/10/2020, 05/26/2020, 05/26/2021   Influenza,inj,Quad PF,6+ Mos 03/03/2013   Influenza,inj,quad, With Preservative 03/20/2018   Influenza-Unspecified 04/09/2014, 04/09/2015, 04/09/2017, 03/20/2018   PFIZER(Purple Top)SARS-COV-2 Vaccination 07/27/2019, 08/14/2019, 04/30/2020, 05/26/2020, 12/07/2020   Pfizer Covid-19 Vaccine Bivalent Booster 41yrs & up 06/15/2021   Pneumococcal Conjugate-13 05/05/2015   Pneumococcal Polysaccharide-23 04/09/2004, 05/21/2017, 05/20/2018, 05/26/2019, 05/26/2020, 05/26/2021   Td 12/08/2005   Tdap 12/22/2014   Zoster Recombinat (Shingrix) 08/01/2017, 10/03/2017   Zoster, Live 01/06/2008    TDAP status: Up to date  Flu Vaccine status: Up to date  Pneumococcal vaccine status: Up to date  Covid-19 vaccine status: Completed vaccines  Qualifies for Shingles Vaccine? Yes   Zostavax completed Yes   Shingrix Completed?: Yes  Screening Tests Health Maintenance  Topic Date Due   TETANUS/TDAP  12/21/2024   Pneumonia Vaccine 54+ Years old  Completed   INFLUENZA VACCINE  Completed   DEXA SCAN  Completed   COVID-19 Vaccine  Completed   Zoster Vaccines- Shingrix  Completed   HPV VACCINES  Aged Out    Health Maintenance  There are no preventive care reminders to display for this patient.  Colorectal cancer screening: No longer required.   Mammogram status: Completed 02/08/21. Repeat every year  Bone Density status: Completed 02/08/21. Results reflect: Bone density results: NORMAL. Repeat every 2 years.  Additional Screening:   Vision Screening: Recommended annual ophthalmology exams for early detection of glaucoma and other disorders of the eye. Is the patient up to date with their annual eye exam?  Yes  Who is the provider or what is the name of the office in which the patient attends annual eye exams? Dr Wyatt Portela  If pt is not established with a provider,  would they like to be referred to a provider to establish care? No .   Dental Screening: Recommended annual dental exams for proper oral hygiene  Community Resource Referral / Chronic Care Management: CRR required this visit?  No   CCM required this visit?  No      Plan:     I have personally reviewed and noted the following in the patients chart:   Medical and social history Use of alcohol, tobacco or illicit drugs  Current medications and supplements including opioid prescriptions.  Functional ability and status Nutritional status Physical activity Advanced directives List of other physicians Hospitalizations, surgeries, and ER visits in previous 12 months Vitals Screenings to include cognitive, depression, and falls Referrals and appointments  In addition, I have reviewed and discussed with patient certain preventive protocols, quality metrics, and best practice recommendations. A written personalized care plan for  preventive services as well as general preventive health recommendations were provided to patient.     Willette Brace, LPN   11/14/8500   Nurse Notes: None

## 2021-08-12 NOTE — Patient Instructions (Signed)
Ms. Funk , Thank you for taking time to come for your Medicare Wellness Visit. I appreciate your ongoing commitment to your health goals. Please review the following plan we discussed and let me know if I can assist you in the future.   Screening recommendations/referrals: Colonoscopy: No longer required  Mammogram: Done 02/08/21 repeat every year  Bone Density: Done 02/08/21 repeat every 2 year  Recommended yearly ophthalmology/optometry visit for glaucoma screening and checkup Recommended yearly dental visit for hygiene and checkup  Vaccinations: Influenza vaccine: Done 03/22/21 Pneumococcal vaccine: Up to date Tdap vaccine: Done 12/22/14 repeat every 10 years  Shingles vaccine: Completed 1/23, 10/03/17    Covid-19:Completed 1/17, 2/4, 10/22, 05/26/20, & 5/31, 127/22  Advanced directives: Copies in chart   Conditions/risks identified: None at this time   Next appointment: Follow up in one year for your annual wellness visit    Preventive Care 65 Years and Older, Female Preventive care refers to lifestyle choices and visits with your health care provider that can promote health and wellness. What does preventive care include? A yearly physical exam. This is also called an annual well check. Dental exams once or twice a year. Routine eye exams. Ask your health care provider how often you should have your eyes checked. Personal lifestyle choices, including: Daily care of your teeth and gums. Regular physical activity. Eating a healthy diet. Avoiding tobacco and drug use. Limiting alcohol use. Practicing safe sex. Taking low-dose aspirin every day. Taking vitamin and mineral supplements as recommended by your health care provider. What happens during an annual well check? The services and screenings done by your health care provider during your annual well check will depend on your age, overall health, lifestyle risk factors, and family history of disease. Counseling  Your health  care provider may ask you questions about your: Alcohol use. Tobacco use. Drug use. Emotional well-being. Home and relationship well-being. Sexual activity. Eating habits. History of falls. Memory and ability to understand (cognition). Work and work Statistician. Reproductive health. Screening  You may have the following tests or measurements: Height, weight, and BMI. Blood pressure. Lipid and cholesterol levels. These may be checked every 5 years, or more frequently if you are over 80 years old. Skin check. Lung cancer screening. You may have this screening every year starting at age 85 if you have a 30-pack-year history of smoking and currently smoke or have quit within the past 15 years. Fecal occult blood test (FOBT) of the stool. You may have this test every year starting at age 44. Flexible sigmoidoscopy or colonoscopy. You may have a sigmoidoscopy every 5 years or a colonoscopy every 10 years starting at age 55. Hepatitis C blood test. Hepatitis B blood test. Sexually transmitted disease (STD) testing. Diabetes screening. This is done by checking your blood sugar (glucose) after you have not eaten for a while (fasting). You may have this done every 1-3 years. Bone density scan. This is done to screen for osteoporosis. You may have this done starting at age 69. Mammogram. This may be done every 1-2 years. Talk to your health care provider about how often you should have regular mammograms. Talk with your health care provider about your test results, treatment options, and if necessary, the need for more tests. Vaccines  Your health care provider may recommend certain vaccines, such as: Influenza vaccine. This is recommended every year. Tetanus, diphtheria, and acellular pertussis (Tdap, Td) vaccine. You may need a Td booster every 10 years. Zoster vaccine. You may  need this after age 34. Pneumococcal 13-valent conjugate (PCV13) vaccine. One dose is recommended after age  58. Pneumococcal polysaccharide (PPSV23) vaccine. One dose is recommended after age 64. Talk to your health care provider about which screenings and vaccines you need and how often you need them. This information is not intended to replace advice given to you by your health care provider. Make sure you discuss any questions you have with your health care provider. Document Released: 07/23/2015 Document Revised: 03/15/2016 Document Reviewed: 04/27/2015 Elsevier Interactive Patient Education  2017 [employer] Prevention in the Home Falls can cause injuries. They can happen to people of all ages. There are many things you can do to make your home safe and to help prevent falls. What can I do on the outside of my home? Regularly fix the edges of walkways and driveways and fix any cracks. Remove anything that might make you trip as you walk through a door, such as a raised step or threshold. Trim any bushes or trees on the path to your home. Use bright outdoor lighting. Clear any walking paths of anything that might make someone trip, such as rocks or tools. Regularly check to see if handrails are loose or broken. Make sure that both sides of any steps have handrails. Any raised decks and porches should have guardrails on the edges. Have any leaves, snow, or ice cleared regularly. Use sand or salt on walking paths during winter. Clean up any spills in your garage right away. This includes oil or grease spills. What can I do in the bathroom? Use night lights. Install grab bars by the toilet and in the tub and shower. Do not use towel bars as grab bars. Use non-skid mats or decals in the tub or shower. If you need to sit down in the shower, use a plastic, non-slip stool. Keep the floor dry. Clean up any water that spills on the floor as soon as it happens. Remove soap buildup in the tub or shower regularly. Attach bath mats securely with double-sided non-slip rug tape. Do not have throw  rugs and other things on the floor that can make you trip. What can I do in the bedroom? Use night lights. Make sure that you have a light by your bed that is easy to reach. Do not use any sheets or blankets that are too big for your bed. They should not hang down onto the floor. Have a firm chair that has side arms. You can use this for support while you get dressed. Do not have throw rugs and other things on the floor that can make you trip. What can I do in the kitchen? Clean up any spills right away. Avoid walking on wet floors. Keep items that you use a lot in easy-to-reach places. If you need to reach something above you, use a strong step stool that has a grab bar. Keep electrical cords out of the way. Do not use floor polish or wax that makes floors slippery. If you must use wax, use non-skid floor wax. Do not have throw rugs and other things on the floor that can make you trip. What can I do with my stairs? Do not leave any items on the stairs. Make sure that there are handrails on both sides of the stairs and use them. Fix handrails that are broken or loose. Make sure that handrails are as long as the stairways. Check any carpeting to make sure that it is firmly attached  to the stairs. Fix any carpet that is loose or worn. Avoid having throw rugs at the top or bottom of the stairs. If you do have throw rugs, attach them to the floor with carpet tape. Make sure that you have a light switch at the top of the stairs and the bottom of the stairs. If you do not have them, ask someone to add them for you. What else can I do to help prevent falls? Wear shoes that: Do not have high heels. Have rubber bottoms. Are comfortable and fit you well. Are closed at the toe. Do not wear sandals. If you use a stepladder: Make sure that it is fully opened. Do not climb a closed stepladder. Make sure that both sides of the stepladder are locked into place. Ask someone to hold it for you, if  possible. Clearly mark and make sure that you can see: Any grab bars or handrails. First and last steps. Where the edge of each step is. Use tools that help you move around (mobility aids) if they are needed. These include: Canes. Walkers. Scooters. Crutches. Turn on the lights when you go into a dark area. Replace any light bulbs as soon as they burn out. Set up your furniture so you have a clear path. Avoid moving your furniture around. If any of your floors are uneven, fix them. If there are any pets around you, be aware of where they are. Review your medicines with your doctor. Some medicines can make you feel dizzy. This can increase your chance of falling. Ask your doctor what other things that you can do to help prevent falls. This information is not intended to replace advice given to you by your health care provider. Make sure you discuss any questions you have with your health care provider. Document Released: 04/22/2009 Document Revised: 12/02/2015 Document Reviewed: 07/31/2014 Elsevier Interactive Patient Education  2017 [employer].

## 2021-08-13 ENCOUNTER — Other Ambulatory Visit: Payer: Self-pay | Admitting: Family Medicine

## 2021-08-15 ENCOUNTER — Other Ambulatory Visit: Payer: Self-pay

## 2021-08-15 ENCOUNTER — Encounter: Payer: Self-pay | Admitting: Family Medicine

## 2021-08-15 ENCOUNTER — Ambulatory Visit (INDEPENDENT_AMBULATORY_CARE_PROVIDER_SITE_OTHER): Payer: Medicare HMO | Admitting: Family Medicine

## 2021-08-15 VITALS — BP 138/60 | HR 81 | Temp 97.7°F | Ht 65.0 in | Wt 178.3 lb

## 2021-08-15 DIAGNOSIS — G4700 Insomnia, unspecified: Secondary | ICD-10-CM | POA: Diagnosis not present

## 2021-08-15 DIAGNOSIS — I10 Essential (primary) hypertension: Secondary | ICD-10-CM | POA: Diagnosis not present

## 2021-08-15 DIAGNOSIS — F1021 Alcohol dependence, in remission: Secondary | ICD-10-CM

## 2021-08-15 DIAGNOSIS — M129 Arthropathy, unspecified: Secondary | ICD-10-CM

## 2021-08-15 DIAGNOSIS — R69 Illness, unspecified: Secondary | ICD-10-CM | POA: Diagnosis not present

## 2021-08-15 MED ORDER — TRAMADOL HCL 50 MG PO TABS: ORAL_TABLET | ORAL | 5 refills | Status: DC

## 2021-08-15 MED ORDER — TRAZODONE HCL 50 MG PO TABS: ORAL_TABLET | ORAL | 3 refills | Status: DC

## 2021-08-15 NOTE — Patient Instructions (Addendum)
Trial tramadol 50 mg - can try half up to 3x a day or 1.5 per day total with #45 planned per month  Refill trazodone- thrilled helpful  Recommended follow up: Return for next already scheduled visit.

## 2021-08-15 NOTE — Progress Notes (Signed)
Phone [phone] In person visit   Subjective:   Sheila Shields is a 87 y.o. year old very pleasant female patient who presents for/with See problem oriented charting Chief Complaint  Patient presents with   Follow-up    Pt is here to discuss a stronger pain med, she states the Tylenol is not working anymore for her arthritis. Pt does have Tramadol but is not sure if you want her to take it or not.    This visit occurred during the SARS-CoV-2 public health emergency.  Safety protocols were in place, including screening questions prior to the visit, additional usage of staff PPE, and extensive cleaning of exam room while observing appropriate contact time as indicated for disinfecting solutions.   Past Medical History-  Patient Active Problem List   Diagnosis Date Noted   Mild aortic stenosis 05/24/2020    Priority: High   Brain aneurysm 12/30/2018    Priority: High   Chronic cough 10/11/2015    Priority: High   COPD (chronic obstructive pulmonary disease) ([place]) 11/18/2014    Priority: High   HYPERGLYCEMIA 08/27/2007    Priority: Medium    Gastroesophageal reflux disease 05/01/2007    Priority: Medium    Hyperlipemia 12/17/2006    Priority: Medium    Essential hypertension 12/17/2006    Priority: Medium    Former smoker 05/22/2019    Priority: Low   Aortic atherosclerosis ([place]) 04/11/2018    Priority: Low   Allergic rhinitis 11/09/2017    Priority: Low   Arm mass, left 04/16/2017    Priority: Low   Iron deficiency anemia, unspecified 01/20/2011    Priority: Low   Arthropathy 02/23/2010    Priority: Low   Alcoholism in remission ([place]) 08/27/2007    Priority: Low   Insomnia 05/24/2020   Malignant neoplasm of upper-outer quadrant of left breast in female, estrogen receptor positive ([place]) 02/12/2020    Medications- reviewed and updated Current Outpatient Medications  Medication Sig Dispense Refill   acetaminophen (TYLENOL) 650 MG CR tablet Take 1,300 mg by mouth 2  (two) times daily.     albuterol (PROAIR HFA) 108 (90 Base) MCG/ACT inhaler INHALE 2 PUFFS EVERY 4 HOURS AS NEEDED FOR COUGHING SPELLS 18 g 5   aspirin 325 MG tablet Take 1 tablet (325 mg total) by mouth daily. 30 tablet 3   clopidogrel (PLAVIX) 75 MG tablet Take 0.5 tablets (37.5 mg total) by mouth daily. 46 tablet 3   Coenzyme Q10 (COQ-10 PO) Take 1 capsule by mouth daily.     Cyanocobalamin (VITAMIN B-12 PO) Take 1 tablet by mouth daily.     diclofenac Sodium (VOLTAREN) 1 % GEL Apply 2 g topically 4 (four) times daily. (Patient taking differently: Apply 2 g topically 4 (four) times daily as needed (pain).) 100 g 11   fexofenadine (ALLEGRA) 180 MG tablet Take 180 mg by mouth daily.     fluticasone (FLONASE) 50 MCG/ACT nasal spray Place 2 sprays into both nostrils daily. 16 g 2   hydrochlorothiazide (HYDRODIURIL) 25 MG tablet Take 0.5 tablets (12.5 mg total) by mouth daily. 46 tablet 3   Multiple Vitamin (MULTIVITAMIN WITH MINERALS) TABS tablet Take 1 tablet by mouth daily.     pantoprazole (PROTONIX) 20 MG tablet Take 1 tablet (20 mg total) by mouth daily. 90 tablet 3   potassium chloride SA (KLOR-CON M20) 20 MEQ tablet Take 1 tablet (20 mEq total) by mouth every other day. 46 tablet 3   Propylene Glycol (SYSTANE BALANCE OP)  Place 1 drop into both eyes every 6 (six) hours as needed (dry eyes).     rosuvastatin (CRESTOR) 20 MG tablet Take 1 tablet (20 mg total) by mouth daily. 90 tablet 3   tamoxifen (NOLVADEX) 20 MG tablet TAKE ONE TABLET BY MOUTH DAILY 90 tablet 1   Turmeric (QC TUMERIC COMPLEX PO) Take 1 tablet by mouth daily.     VITAMIN D PO Take 1 capsule by mouth daily.     traMADol (ULTRAM) 50 MG tablet TAKE ONE TABLET BY MOUTH EVERY 8 HOURS AS NEEDED FOR PAIN FOR UP TO 5 DAYS (DO NOT DRIVE FOR 8 HOURS AFTER TAKING) 45 tablet 5   traZODone (DESYREL) 50 MG tablet TAKE 1 TABLET BY MOUTH EVERY NIGHT AT BEDTIME AS NEEDED FOR SLEEP 90 tablet 3   No current facility-administered medications  for this visit.     Objective:  BP 138/60    Pulse 81    Temp 97.7 F (36.5 C)    Ht 5\' 5"  (1.651 m)    Wt 178 lb 4.8 oz (80.9 kg)    SpO2 95%    BMI 29.67 kg/m  Gen: NAD, resting comfortably CV: RRR no murmurs rubs or gallops Lungs: CTAB no crackles, wheeze, rhonchi Ext: no edema Skin: warm, dry    Assessment and Plan   #hypertension S: medication: Hydrochlorothiazide 12.5 mg-also takes potassium with this every other day. Reduced from 25 mg on 11/23/20 BP Readings from Last 3 Encounters:  08/15/21 138/60  08/12/21 130/76  05/26/21 126/60  A/P:  high normal but acceptable- continue current meds.    #  Arthralgias/OA S: turmeric helpful, sparing tramadol  for joint pain but sh eworries about addictive potential- will be 40 years sober soon  The worst of her pain was her shoulders and down into elbows and into her neck when weather got cold plus her left ankle.   Tylenol arthritis is not giving same level of relief that it used to-took 650 mg x2- pain at moment 6-7/10. When was more effective got pain down under 3.   She has 5 tramadol left but did not want to use in case had even more severe pain. Last fill #15 was 5 months ago A/P: arthralgias/OA not well controlled with tylenol arthritis.  With multiple joints- SM or ortho refer would likely not be as helpful - would need multiple evals/potential treatments- though I am open to referral. She would ike to focus on pain management- we will add in tramadol (has done well alcohol free for 40 years now) at more consistent basis with tylenol arthritis still as baseline pain control  -avoid nsaids with aneurysm history and age/risk to kidneys- though got a good report on her right aneurysm- still is going to have follow up for the left side- is to remain on aspirin 325 mg and plavix hafl tablet  #Insomnia S: Patient with intermittent issues for 25 years with sleep.  Trial of trazodone November 2021- has been working well in  2023 -prefer to avoid Benzodiazepines and Ambien with alcoholism history -Could also consider CBT-I A/P: patient reports very well controlled- refilled trazodone for 90 days- has been using full tablet 50 mg   #alcoholism in remission-39 years sober in 2022- congratulated her- closing in on 40 years!    Recommended follow up: Return for next already scheduled visit. Future Appointments  Date Time Provider [place]  09/12/2021 10:00 AM [facility] None  09/12/2021 10:30 AM Alla Feeling, NP  [facility] None  11/29/2021 10:40 AM Marin Olp, MD [facility]  08/25/2022  9:30 AM [facility] HEALTH COACH [facility]   Lab/Order associations:   ICD-10-CM   1. Essential hypertension  I10     2. Arthropathy  M12.9     3. Alcoholism in remission ([place]) Chronic F10.21     4. Insomnia, unspecified type  G47.00       Meds ordered this encounter  Medications   traMADol (ULTRAM) 50 MG tablet    Sig: TAKE ONE TABLET BY MOUTH EVERY 8 HOURS AS NEEDED FOR PAIN FOR UP TO 5 DAYS (DO NOT DRIVE FOR 8 HOURS AFTER TAKING)    Dispense:  45 tablet    Refill:  5   traZODone (DESYREL) 50 MG tablet    Sig: TAKE 1 TABLET BY MOUTH EVERY NIGHT AT BEDTIME AS NEEDED FOR SLEEP    Dispense:  90 tablet    Refill:  3    Return precautions advised.  Garret Reddish, MD

## 2021-08-17 ENCOUNTER — Encounter (HOSPITAL_COMMUNITY): Payer: Self-pay

## 2021-08-17 ENCOUNTER — Encounter: Payer: Self-pay | Admitting: Family Medicine

## 2021-08-23 DIAGNOSIS — M25512 Pain in left shoulder: Secondary | ICD-10-CM | POA: Diagnosis not present

## 2021-08-23 DIAGNOSIS — M25571 Pain in right ankle and joints of right foot: Secondary | ICD-10-CM | POA: Diagnosis not present

## 2021-08-23 DIAGNOSIS — M25562 Pain in left knee: Secondary | ICD-10-CM | POA: Diagnosis not present

## 2021-08-29 ENCOUNTER — Ambulatory Visit (INDEPENDENT_AMBULATORY_CARE_PROVIDER_SITE_OTHER): Payer: Medicare HMO | Admitting: Family

## 2021-08-29 ENCOUNTER — Encounter: Payer: Self-pay | Admitting: Family

## 2021-08-29 ENCOUNTER — Other Ambulatory Visit: Payer: Self-pay

## 2021-08-29 VITALS — BP 124/77 | HR 75 | Temp 97.4°F | Ht 65.0 in | Wt 173.4 lb

## 2021-08-29 DIAGNOSIS — J441 Chronic obstructive pulmonary disease with (acute) exacerbation: Secondary | ICD-10-CM

## 2021-08-29 MED ORDER — PREDNISONE 20 MG PO TABS: ORAL_TABLET | ORAL | 0 refills | Status: DC

## 2021-08-29 MED ORDER — AZITHROMYCIN 250 MG PO TABS: ORAL_TABLET | ORAL | 0 refills | Status: AC

## 2021-08-29 NOTE — Progress Notes (Signed)
Subjective:     Patient ID: Sheila Shields, female    DOB: 01-12-1935, 87 y.o.   MRN: [id number]  Chief Complaint  Patient presents with   Cough    Symptoms started 2 weeks ago. She states going through 2 bottles of Robitussin.    Nasal Congestion   Generalized Body Aches    She states taking 2 Tylenol (650mg ) twice daily has not helped.     HPI: Upper Respiratory Infection: Symptoms include achiness, no  fever, productive cough with  green colored sputum, shortness of breath, and wheezing and pleuritic chest pain. Onset of symptoms was 2 weeks ago, unchanged since that time. She is drinking moderate amounts of fluids. Evaluation to date: none. Treatment to date: cough suppressants.  There are no preventive care reminders to display for this patient.  Past Medical History:  Diagnosis Date   Alcoholism in remission ([place]) 08/27/2007   Anemia    Arthritis    OA   Breast cancer ([place])    left breast   COPD 12/17/2006   Diverticulosis 03/27/2007   GERD 05/01/2007   HYPERGLYCEMIA 08/27/2007   HYPERLIPIDEMIA 12/17/2006   HYPERTENSION 12/17/2006   Mood disorder ([place])    hx anxiety and depression. meds short term during life transition   Pneumonia    AS A CHILD   Rectal polyp 03/27/2007   adenoma   SOB (shortness of breath) on exertion    per patient due to having COPD   TIA (transient ischemic attack)     Past Surgical History:  Procedure Laterality Date   ABDOMINAL HYSTERECTOMY  1978   fibroma   APPENDECTOMY  2002   BREAST EXCISIONAL BIOPSY Right 2003   negative   BREAST LUMPECTOMY WITH RADIOACTIVE SEED LOCALIZATION Left 03/23/2020   Procedure: LEFT BREAST LUMPECTOMY WITH RADIOACTIVE SEED LOCALIZATION;  Surgeon: [name], MD;  Location: [facility];  Service: General;  Laterality: Left;  RNFA   CARPAL TUNNEL RELEASE Left 2010 or 2011   CATARACT EXTRACTION W/ INTRAOCULAR LENS  IMPLANT, BILATERAL Bilateral    COLONOSCOPY W/ POLYPECTOMY  03/27/2007; n7/19/2012   2008: 4 mm adenoma,  diverticulosis 2012: ileitis ? NSAID - likely, 2-3 mm cecal polyp LYMPHOID FOLLICLE, diverticulosis   ESOPHAGOGASTRODUODENOSCOPY  01/26/2011   reflux esophagitis, colonscopy done also   HAMMER TOE SURGERY  2008   left foot   HERNIA REPAIR Right 1996?   IR ANGIO INTRA EXTRACRAN SEL COM CAROTID INNOMINATE UNI R MOD SED  11/28/2018   IR ANGIO INTRA EXTRACRAN SEL COM CAROTID INNOMINATE UNI R MOD SED  03/17/2021   IR ANGIO INTRA EXTRACRAN SEL INTERNAL CAROTID UNI R MOD SED  12/30/2018   IR ANGIO VERTEBRAL SEL SUBCLAVIAN INNOMINATE UNI R MOD SED  11/28/2018   IR ANGIO VERTEBRAL SEL VERTEBRAL UNI R MOD SED  03/18/2021   IR ANGIOGRAM FOLLOW UP STUDY  12/30/2018   IR TRANSCATH/EMBOLIZ  12/30/2018   IR US GUIDE VASC ACCESS RIGHT  11/28/2018   IR US GUIDE VASC ACCESS RIGHT  03/17/2021   ORIF ANKLE FRACTURE Right 12/22/2014   Procedure: OPEN REDUCTION INTERNAL FIXATION (ORIF) ANKLE FRACTURE;  Surgeon: Susa Day, MD;  Location: [facility];  Service: Orthopedics;  Laterality: Right;   RADIOLOGY WITH ANESTHESIA N/A 12/30/2018   Procedure: RADIOLOGY WITH ANESTHESIA;  Surgeon: Luanne Bras, MD;  Location: [facility];  Service: Radiology;  Laterality: N/A;   SHOULDER OPEN ROTATOR CUFF REPAIR Left 03/06/2013   Procedure: LEFT ROTATOR CUFF REPAIR, SUBACROMIAL DECOMPRESSION, PATCH GRAFT, MANIPULATION  UNDER ANESTHESIA;  Surgeon: Johnn Hai, MD;  Location: [facility];  Service: Orthopedics;  Laterality: Left;   TONSILLECTOMY      Outpatient Medications Prior to Visit  Medication Sig Dispense Refill   acetaminophen (TYLENOL) 650 MG CR tablet Take 1,300 mg by mouth 2 (two) times daily.     albuterol (PROAIR HFA) 108 (90 Base) MCG/ACT inhaler INHALE 2 PUFFS EVERY 4 HOURS AS NEEDED FOR COUGHING SPELLS 18 g 5   aspirin 325 MG tablet Take 1 tablet (325 mg total) by mouth daily. 30 tablet 3   clopidogrel (PLAVIX) 75 MG tablet Take 0.5 tablets (37.5 mg total) by mouth daily. 46 tablet 3   Coenzyme Q10 (COQ-10 PO) Take 1 capsule by  mouth daily.     Cyanocobalamin (VITAMIN B-12 PO) Take 1 tablet by mouth daily.     diclofenac Sodium (VOLTAREN) 1 % GEL Apply 2 g topically 4 (four) times daily. (Patient taking differently: Apply 2 g topically 4 (four) times daily as needed (pain).) 100 g 11   fexofenadine (ALLEGRA) 180 MG tablet Take 180 mg by mouth daily.     fluticasone (FLONASE) 50 MCG/ACT nasal spray Place 2 sprays into both nostrils daily. 16 g 2   hydrochlorothiazide (HYDRODIURIL) 25 MG tablet Take 0.5 tablets (12.5 mg total) by mouth daily. 46 tablet 3   Multiple Vitamin (MULTIVITAMIN WITH MINERALS) TABS tablet Take 1 tablet by mouth daily.     pantoprazole (PROTONIX) 20 MG tablet Take 1 tablet (20 mg total) by mouth daily. 90 tablet 3   potassium chloride SA (KLOR-CON M20) 20 MEQ tablet Take 1 tablet (20 mEq total) by mouth every other day. 46 tablet 3   Propylene Glycol (SYSTANE BALANCE OP) Place 1 drop into both eyes every 6 (six) hours as needed (dry eyes).     rosuvastatin (CRESTOR) 20 MG tablet Take 1 tablet (20 mg total) by mouth daily. 90 tablet 3   tamoxifen (NOLVADEX) 20 MG tablet TAKE ONE TABLET BY MOUTH DAILY 90 tablet 1   traMADol (ULTRAM) 50 MG tablet TAKE ONE TABLET BY MOUTH EVERY 8 HOURS AS NEEDED FOR PAIN FOR UP TO 5 DAYS (DO NOT DRIVE FOR 8 HOURS AFTER TAKING) 45 tablet 5   traZODone (DESYREL) 50 MG tablet TAKE 1 TABLET BY MOUTH EVERY NIGHT AT BEDTIME AS NEEDED FOR SLEEP 90 tablet 3   Turmeric (QC TUMERIC COMPLEX PO) Take 1 tablet by mouth daily.     VITAMIN D PO Take 1 capsule by mouth daily.     No facility-administered medications prior to visit.    Allergies  Allergen Reactions   Alcohol Other (See Comments)    Recovering Alcoholic*   Codeine     Patient prefers not to take this due to history of alcoholism   Dilaudid [Hydromorphone Hcl]     Pt had hypotension after dilaudid 1mg  IV while in the OR, required temporary pressor intervention.        Objective:    Physical Exam Vitals and  nursing note reviewed.  Constitutional:      Appearance: Normal appearance.  Cardiovascular:     Rate and Rhythm: Normal rate and regular rhythm.  Pulmonary:     Effort: Pulmonary effort is normal.     Breath sounds: Normal breath sounds.  Musculoskeletal:        General: Normal range of motion.  Skin:    General: Skin is warm and dry.  Neurological:     Mental Status: She is  alert.  Psychiatric:        Mood and Affect: Mood normal.        Behavior: Behavior normal.    BP 124/77    Pulse 75    Temp (!) 97.4 F (36.3 C) (Temporal)    Ht 5\' 5"  (1.651 m)    Wt 173 lb 6.4 oz (78.7 kg)    SpO2 91%    BMI 28.86 kg/m  Wt Readings from Last 3 Encounters:  08/29/21 173 lb 6.4 oz (78.7 kg)  08/15/21 178 lb 4.8 oz (80.9 kg)  08/12/21 177 lb 6.4 oz (80.5 kg)       Assessment & Plan:   Problem List Items Addressed This Visit   None Visit Diagnoses     COPD exacerbation ([place])    -  Primary   pt reports fighting cough since Nov - gets better then worse again. Given Trelegy inhaler which was helping but too expensive. Advised asking PCP about generic Symbicort or Advair as it sounds like she would benefit from a daily maintenance vs rescue qd. Sending prednisone & Zpack, pt advised on use & SE.  Relevant Medications   predniSONE (DELTASONE) 20 MG tablet   azithromycin (ZITHROMAX) 250 MG tablet       Meds ordered this encounter  Medications   predniSONE (DELTASONE) 20 MG tablet    Sig: Take 2 pills in the morning with breakfast for 3 days, then 1 pill for 3 days, then 1/2 pill for 4 days    Dispense:  11 tablet    Refill:  0    Order Specific Question:   Supervising Provider    Answer:   ANDY, CAMILLE L [[id number]]   azithromycin (ZITHROMAX) 250 MG tablet    Sig: Take 2 tablets on day 1, then 1 tablet daily on days 2 through 5    Dispense:  6 tablet    Refill:  0    Order Specific Question:   Supervising Provider    Answer:   ANDY, CAMILLE L [[id number]]

## 2021-08-29 NOTE — Patient Instructions (Signed)
It was very nice to see you today!  I have sent a round of prednisone and a Zpack antibiotic to your pharmacy, start these today and then take the prednisone in the mornings.  Drink plenty of water! Talk to Dr. Yong Channel about adding back a steroid inhaler such as generic Symbicort or Advair for maintenance therapy to avoid more COPD exacerbations, and these may be less copay.     PLEASE NOTE:  If you had any lab tests please let us know if you have not heard back within a few days. You may see your results on MyChart before we have a chance to review them but we will give you a call once they are reviewed by [place]. If we ordered any referrals today, please let us know if you have not heard from their office within the next week.   Please try these tips to maintain a healthy lifestyle:  Eat most of your calories during the day when you are active. Eliminate processed foods including packaged sweets (pies, cakes, cookies), reduce intake of potatoes, white bread, white pasta, and white rice. Look for whole grain options, oat flour or almond flour.  Each meal should contain half fruits/vegetables, one quarter protein, and one quarter carbs (no bigger than a computer mouse).  Cut down on sweet beverages. This includes juice, soda, and sweet tea. Also watch fruit intake, though this is a healthier sweet option, it still contains natural sugar! Limit to 3 servings daily.  Drink at least 1 glass of water with each meal and aim for at least 8 glasses per day  Exercise at least 150 minutes every week.

## 2021-09-11 NOTE — Progress Notes (Signed)
Sheila Shields   [phone] [phone]   Clinic Follow up Note   Patient Care Team: Marin Olp, MD as PCP - General (Family Medicine) Pieter Partridge, DO as Consulting Physician (Neurology) Collene Gobble, MD as Consulting Physician (Pulmonary Disease) Milton, P.A. as Consulting Physician Jarome Matin, MD as Consulting Physician (Dermatology) Luanne Bras, MD as Consulting Physician (Interventional Radiology) Mauro Kaufmann, RN as Oncology Nurse Navigator Rockwell Germany, RN as Oncology Nurse Navigator Truitt Merle, MD as Consulting Physician (Hematology) [name], MD as Consulting Physician (General Surgery) Kyung Rudd, MD as Consulting Physician (Radiation Oncology) Alla Feeling, NP as Nurse Practitioner (Nurse Practitioner) 09/12/2021  CHIEF COMPLAINT: Follow up left breast cancer   SUMMARY OF ONCOLOGIC HISTORY: Oncology History Overview Note  Cancer Staging Malignant neoplasm of upper-outer quadrant of left breast in female, estrogen receptor positive ([place]) Staging form: Breast, AJCC 8th Edition - Clinical stage from 02/09/2020: Stage IA (cT1b, cN0, cM0, G2, ER+, PR+, HER2-) - Signed by Truitt Merle, MD on 02/17/2020 Stage prefix: Initial diagnosis Histologic grading system: 3 grade system - Pathologic stage from 03/23/2020: Stage Unknown (pT1c, pNX, cM0, G2, ER+, PR+, HER2-) - Signed by Truitt Merle, MD on 05/25/2020 Stage prefix: Initial diagnosis Multigene prognostic tests performed: None Histologic grading system: 3 grade system    Malignant neoplasm of upper-outer quadrant of left breast in female, estrogen receptor positive ([place])  01/30/2020 Mammogram   IMPRESSION: 0.6x0.4x0.6cm suspicious mass 12 o'clock axis left breast, 3 cm from the nipple.    02/09/2020 Receptors her2   PROGNOSTIC INDICATORS Results: IMMUNOHISTOCHEMICAL AND MORPHOMETRIC ANALYSIS PERFORMED MANUALLY The tumor cells are NEGATIVE for Her2  (1+). Estrogen Receptor: 95%, POSITIVE, STRONG STAINING INTENSITY Progesterone Receptor: 90%, POSITIVE, MODERATE STAINING INTENSITY Proliferation Marker Ki67: 15%   02/09/2020 Cancer Staging   Staging form: Breast, AJCC 8th Edition - Clinical stage from 02/09/2020: Stage IA (cT1b, cN0, cM0, G2, ER+, PR+, HER2-) - Signed by Truitt Merle, MD on 02/17/2020    02/12/2020 Initial Diagnosis   Malignant neoplasm of upper-outer quadrant of left breast in female, estrogen receptor positive ([place])   02/16/2020 Initial Biopsy   Diagnosis Breast, left, needle core biopsy, 12:30, 3 cmfn - INVASIVE MAMMARY CARCINOMA, SEE COMMENT. - MAMMARY CARCINOMA IN SITU. Microscopic Comment The carcinoma appears grade 2 and measures 11 mm in greatest linear extent. E-cadherin will be ordered. Prognostic makers will be ordered. Dr. Saralyn Pilar has reviewed the case. The case was called to [place] on 02/10/2020.   03/23/2020 Surgery   LEFT BREAST LUMPECTOMY WITH RADIOACTIVE SEED LOCALIZATION by Dr Barry Dienes     03/23/2020 Pathology Results   FINAL MICROSCOPIC DIAGNOSIS:   A. BREAST, LEFT, LUMPECTOMY:  - Invasive lobular carcinoma, 1.1 cm, Nottingham grade 2 of 3.  - Invasive carcinoma focally involves the medial margin.  - Biopsy clip site.  - See oncology table.   B. BREAST, LEFT, ADDITIONAL MEDIAL MARGIN, EXCISION:  - Breast tissue; no invasive carcinoma identified.   C. BREAST, LEFT, ADDITIONAL SUPERIOR MARGIN, EXCISION:  - Breast tissue; no invasive carcinoma identified.   D. BREAST, LEFT, ADDITIONAL POSTERIOR MARGIN, EXCISION:  - Breast tissue; no invasive carcinoma identified.    03/23/2020 Cancer Staging   Staging form: Breast, AJCC 8th Edition - Pathologic stage from 03/23/2020: Stage Unknown (pT1c, pNX, cM0, G2, ER+, PR+, HER2-) - Signed by Truitt Merle, MD on 05/25/2020    04/29/2020 - 05/26/2020 Radiation Therapy   Adjuvant Radiation with  [name] 04/29/20-05/26/20    06/2020 -   Anti-estrogen oral therapy   Tamoxifen 34m once daily starting 06/2020   08/30/2020 Survivorship   SCP done virtually by [name] NP. Care plan sent to patient via Mychart      CURRENT THERAPY: Tamoxifen 20 mg once daily, starting 06/2020  INTERVAL HISTORY: [name] for follow up as scheduled. Last seen by [name]9/7/22.  [name] November.  Mammogram due 02/2022. She continues tamoxifen, tolerating well with occasional hot flashes/night sweats.  Her arthritic pain increased recently, very much weather dependent but denies new pain.  She takes arthritic strength Tylenol 2 tabs twice daily, saw PCP who prescribed tramadol for midday which she does not need if the sun is out.  She is recovering from recent COPD exacerbations.  Denies any concerns in her breast such as new lump/mass, nipple discharge or inversion, or skin change.  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  [name]  All other systems were reviewed with the patient and are negative.  MEDICAL HISTORY:  Past Medical History:  Diagnosis Date   [place] [place]   Anemia    Arthritis    OA   Breast cancer ([facility]    left breast   COPD 12/17/2006   Diverticulosis 03/27/2007   GERD 05/01/2007   HYPERGLYCEMIA 08/27/2007   HYPERLIPIDEMIA 12/17/2006   HYPERTENSION 12/17/2006   [place]    hx anxiety and depression. meds short term during life transition   Pneumonia    AS A CHILD   Rectal polyp 03/27/2007   adenoma   SOB (shortness of breath) on exertion    per patient due to having COPD   TIA (transient ischemic attack)     SURGICAL HISTORY: Past Surgical History:  Procedure Laterality Date   ABDOMINAL HYSTERECTOMY  1978   fibroma   APPENDECTOMY  2002   BREAST EXCISIONAL BIOPSY Right 2003   negative   BREAST LUMPECTOMY WITH RADIOACTIVE SEED LOCALIZATION Left 03/23/2020   Procedure: LEFT BREAST  LUMPECTOMY WITH RADIOACTIVE SEED LOCALIZATION;  Surgeon: BStark Klein MD;  [facility]  Service: General;  Laterality: Left;  RNFA   CARPAL TUNNEL RELEASE Left 2010 or 2011   CATARACT EXTRACTION W/ INTRAOCULAR LENS  IMPLANT, BILATERAL Bilateral    COLONOSCOPY W/ POLYPECTOMY  03/27/2007; n7/19/2012   2008: 4 mm adenoma, diverticulosis 2012: ileitis ? NSAID - likely, 2-3 mm cecal polyp LYMPHOID FOLLICLE, diverticulosis   ESOPHAGOGASTRODUODENOSCOPY  01/26/2011   reflux esophagitis, colonscopy done also   HAMMER TOE SURGERY  2008   left foot   HERNIA REPAIR Right 1996?   IR ANGIO INTRA EXTRACRAN SEL COM CAROTID INNOMINATE UNI R MOD SED  11/28/2018   IR ANGIO INTRA EXTRACRAN SEL COM CAROTID INNOMINATE UNI R MOD SED  03/17/2021   IR ANGIO INTRA EXTRACRAN SEL INTERNAL CAROTID UNI R MOD SED  12/30/2018   IR ANGIO VERTEBRAL SEL SUBCLAVIAN INNOMINATE UNI R MOD SED  11/28/2018   IR ANGIO VERTEBRAL SEL VERTEBRAL UNI R MOD SED  03/18/2021   IR ANGIOGRAM FOLLOW UP STUDY  12/30/2018   IR TRANSCATH/EMBOLIZ  12/30/2018   IR [place] ACC[place] RIGHT  11/28/2018   IR [place] ACCESS RIGHT  03/17/2021   ORIF ANKLE FRACTURE Right 12/22/2014   Procedure: OPEN REDUCTION INTERNAL FIXATION (ORIF) ANKLE FRACTURE;  Surgeon: JSusa Day MD;  [facility];  Service: Orthopedics;  Laterality: Right;   RADIOLOGY WITH ANESTHESIA N/A 12/30/2018   Procedure: RADIOLOGY WITH ANESTHESIA;  Surgeon: Luanne Bras, MD;  Location: [facility];  Service: Radiology;  Laterality: N/A;   SHOULDER OPEN ROTATOR CUFF REPAIR Left 03/06/2013   Procedure: LEFT ROTATOR CUFF REPAIR, SUBACROMIAL DECOMPRESSION, PATCH GRAFT, MANIPULATION UNDER ANESTHESIA;  Surgeon: Johnn Hai, MD;  Location: [facility];  Service: Orthopedics;  Laterality: Left;   TONSILLECTOMY      I have reviewed the social history and family history with the patient and they are unchanged from previous note.  ALLERGIES:  is allergic to alcohol, codeine, and dilaudid  [hydromorphone hcl].  MEDICATIONS:  Current Outpatient Medications  Medication Sig Dispense Refill   acetaminophen (TYLENOL) 650 MG CR tablet Take 1,300 mg by mouth 2 (two) times daily.     albuterol (PROAIR HFA) 108 (90 Base) MCG/ACT inhaler INHALE 2 PUFFS EVERY 4 HOURS AS NEEDED FOR COUGHING SPELLS 18 g 5   aspirin 325 MG tablet Take 1 tablet (325 mg total) by mouth daily. 30 tablet 3   clopidogrel (PLAVIX) 75 MG tablet Take 0.5 tablets (37.5 mg total) by mouth daily. 46 tablet 3   Coenzyme Q10 (COQ-10 PO) Take 1 capsule by mouth daily.     Cyanocobalamin (VITAMIN B-12 PO) Take 1 tablet by mouth daily.     diclofenac Sodium (VOLTAREN) 1 % GEL Apply 2 g topically 4 (four) times daily. (Patient taking differently: Apply 2 g topically 4 (four) times daily as needed (pain).) 100 g 11   fexofenadine (ALLEGRA) 180 MG tablet Take 180 mg by mouth daily.     fluticasone (FLONASE) 50 MCG/ACT nasal spray Place 2 sprays into both nostrils daily. 16 g 2   hydrochlorothiazide (HYDRODIURIL) 25 MG tablet Take 0.5 tablets (12.5 mg total) by mouth daily. 46 tablet 3   Multiple Vitamin (MULTIVITAMIN WITH MINERALS) TABS tablet Take 1 tablet by mouth daily.     pantoprazole (PROTONIX) 20 MG tablet Take 1 tablet (20 mg total) by mouth daily. 90 tablet 3   potassium chloride SA (KLOR-CON M20) 20 MEQ tablet Take 1 tablet (20 mEq total) by mouth every other day. 46 tablet 3   predniSONE (DELTASONE) 20 MG tablet Take 2 pills in the morning with breakfast for 3 days, then 1 pill for 3 days, then 1/2 pill for 4 days 11 tablet 0   Propylene Glycol (SYSTANE BALANCE OP) Place 1 drop into both eyes every 6 (six) hours as needed (dry eyes).     rosuvastatin (CRESTOR) 20 MG tablet Take 1 tablet (20 mg total) by mouth daily. 90 tablet 3   tamoxifen (NOLVADEX) 20 MG tablet Take 1 tablet (20 mg total) by mouth daily. 90 tablet 3   traMADol (ULTRAM) 50 MG tablet TAKE ONE TABLET BY MOUTH EVERY 8 HOURS AS NEEDED FOR PAIN FOR UP TO  5 DAYS (DO NOT DRIVE FOR 8 HOURS AFTER TAKING) 45 tablet 5   traZODone (DESYREL) 50 MG tablet TAKE 1 TABLET BY MOUTH EVERY NIGHT AT BEDTIME AS NEEDED FOR SLEEP 90 tablet 3   Turmeric (QC TUMERIC COMPLEX PO) Take 1 tablet by mouth daily.     VITAMIN D PO Take 1 capsule by mouth daily.     No current facility-administered medications for this visit.    PHYSICAL EXAMINATION: ECOG PERFORMANCE STATUS: 0 - Asymptomatic  Vitals:   09/12/21 1023  BP: (!) 152/81  Pulse: 83  Resp: 17  Temp: 97.9 F (36.6  C)  SpO2: 100%   Filed Weights   09/12/21 1023  Weight: 174 lb (78.9 kg)    GENERAL:alert, no distress and comfortable SKIN: No rash NECK: Without mass LYMPH:  no palpable cervical or supraclavicular lymphadenopathy LUNGS: clear with normal breathing effort HEART: regular rate & rhythm, no lower extremity edema ABDOMEN: abdomen soft, non-tender and normal bowel sounds Musculoskeletal: No focal spinal tenderness NEURO: alert & oriented x 3 with fluent speech, no focal motor deficits Breast exam: Breasts are symmetrical without nipple discharge or inversion.  Scattered lumpy breast tissue bilaterally.  S/p left lumpectomy, incision completely healed.  Known seroma to left upper breast.  No palpable mass or nodularity in either breast or axilla that I could appreciate.  LABORATORY DATA:  I have reviewed the data as listed CBC Latest Ref Rng & Units 09/12/2021 05/26/2021 03/17/2021  WBC 4.0 - 10.5 K/uL 8.0 9.6 5.4  Hemoglobin 12.0 - 15.0 g/dL 14.2 13.6 13.8  Hematocrit 36.0 - 46.0 % 44.0 40.7 41.0  Platelets 150 - 400 K/uL 214 233.0 204     CMP Latest Ref Rng & Units 09/12/2021 05/26/2021 03/17/2021  Glucose 70 - 99 mg/dL 140(H) 94 96  BUN 8 - 23 mg/dL _0 Creatinine 0.44 - 1.00 mg/dL 0.94 0.87 0.85  Sodium 135 - 145 mmol/L 143 141 140  Potassium 3.5 - 5.1 mmol/L 3.6 4.0 3.7  Chloride 98 - 111 mmol/L 107 102 104  CO2 22 - 32 mmol/L _1 Calcium 8.9 - 10.3 mg/dL 9.3 9.5 9.0   Total Protein 6.5 - 8.1 g/dL 5.9(L) 6.4 -  Total Bilirubin 0.3 - 1.2 mg/dL 0.6 0.5 -  Alkaline Phos 38 - 126 U/L 52 43 -  AST 15 - 41 U/L 15 18 -  ALT 0 - 44 U/L 14 10 -      RADIOGRAPHIC STUDIES: I have personally reviewed the radiological images as listed and agreed with the findings in the report. No results found.   ASSESSMENT & PLAN: Sheila Shields is a 87 y.o. female with    1. Malignant neoplasm of upper-outer quadrant of left breast, Stage IA, pT1cN0M0, lobular carcinoma, ER+/PR+/HER2-, Grade II -Diagnosed in 02/2020 with invasive lobular carcinoma with components of LCIS.  S/p left lumpectomy with Dr Barry Dienes on 03/23/20 and adjuvant RT.  -She started tamoxifen in 06/2020, tolerating well with mild hot flash -most recent mammogram on 02/08/21 was benign. -She is almost 2 years from initial diagnosis, continue surveillance and tamoxifen.   2. Bone Health, Cancer Screening -Baseline DEXA in 01/2015 was +0.7. Recent repeat 02/08/21 remains normal at +0.4. -Repeat in 2024 or 2025   3. Comorbidities: Brain Aneurysm, HTN, HLD, GERD, COPD, Osteoarthritis -Her brain aneurysm was banded in 12/2018. She continues to f/u with Dr Vickey Sages. She is on 340m aspirin.  -She did have very mild TIA and is seen by Dr JTana Felts She is also on half tablet Plavix due to easy bruising.  -She has had recent COPD exacerbations, completed last prednisone taper last week and recovering well -OSA worsened in the winter, she takes 2 arthritic strength Tylenol twice daily and tramadol midday as needed, but does not need if the son is out   4. H/o of alcohol and nicotine use.  -She was also a heavy smoker and quit smoking in 1996. 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 PLachmanis clinically doing well.  Tolerating tamoxifen with mild hot flashes.  Breast  exam shows known left breast seroma, otherwise benign.  CBC and CMP are unremarkable.  Overall there is no clinical concern for breast  cancer recurrence.  Continue surveillance and tamoxifen.  Next mammogram 02/2022.  I encouraged her to continue healthy active lifestyle and age-appropriate health maintenance.  She will see Dr. Barry Dienes in 12/2021, mammogram 02/2022, then follow-up with [place] in Sept/Oct 2023.    Orders Placed This Encounter  Procedures   MM DIAG BREAST TOMO BILATERAL    Standing Status:   Future    Standing Expiration Date:   09/13/2022    Order Specific Question:   Reason for Exam (SYMPTOM  OR DIAGNOSIS REQUIRED)    Answer:   L breast cancer 02/2020    Order Specific Question:   Preferred imaging location?    Answer:   [facility]   All questions were answered. The patient knows to call the clinic with any problems, questions or concerns. No barriers to learning were detected.      Alla Feeling, NP 09/12/21

## 2021-09-12 ENCOUNTER — Inpatient Hospital Stay: Payer: Medicare HMO | Attending: Nurse Practitioner

## 2021-09-12 ENCOUNTER — Other Ambulatory Visit: Payer: Self-pay

## 2021-09-12 ENCOUNTER — Inpatient Hospital Stay: Payer: Medicare HMO | Admitting: Nurse Practitioner

## 2021-09-12 ENCOUNTER — Encounter: Payer: Self-pay | Admitting: Nurse Practitioner

## 2021-09-12 VITALS — BP 152/81 | HR 83 | Temp 97.9°F | Resp 17 | Wt 174.0 lb

## 2021-09-12 DIAGNOSIS — D649 Anemia, unspecified: Secondary | ICD-10-CM | POA: Diagnosis not present

## 2021-09-12 DIAGNOSIS — Z7952 Long term (current) use of systemic steroids: Secondary | ICD-10-CM | POA: Diagnosis not present

## 2021-09-12 DIAGNOSIS — E785 Hyperlipidemia, unspecified: Secondary | ICD-10-CM | POA: Diagnosis not present

## 2021-09-12 DIAGNOSIS — C50412 Malignant neoplasm of upper-outer quadrant of left female breast: Secondary | ICD-10-CM | POA: Insufficient documentation

## 2021-09-12 DIAGNOSIS — Z87891 Personal history of nicotine dependence: Secondary | ICD-10-CM | POA: Insufficient documentation

## 2021-09-12 DIAGNOSIS — Z79899 Other long term (current) drug therapy: Secondary | ICD-10-CM | POA: Diagnosis not present

## 2021-09-12 DIAGNOSIS — F1011 Alcohol abuse, in remission: Secondary | ICD-10-CM | POA: Diagnosis not present

## 2021-09-12 DIAGNOSIS — Z170 Estrogen receptor positive status [ER+]: Secondary | ICD-10-CM | POA: Diagnosis not present

## 2021-09-12 DIAGNOSIS — Z923 Personal history of irradiation: Secondary | ICD-10-CM | POA: Insufficient documentation

## 2021-09-12 DIAGNOSIS — R232 Flushing: Secondary | ICD-10-CM | POA: Insufficient documentation

## 2021-09-12 DIAGNOSIS — I10 Essential (primary) hypertension: Secondary | ICD-10-CM | POA: Diagnosis not present

## 2021-09-12 DIAGNOSIS — Z8673 Personal history of transient ischemic attack (TIA), and cerebral infarction without residual deficits: Secondary | ICD-10-CM | POA: Diagnosis not present

## 2021-09-12 DIAGNOSIS — R69 Illness, unspecified: Secondary | ICD-10-CM | POA: Diagnosis not present

## 2021-09-12 DIAGNOSIS — Z79810 Long term (current) use of selective estrogen receptor modulators (SERMs): Secondary | ICD-10-CM | POA: Insufficient documentation

## 2021-09-12 DIAGNOSIS — M129 Arthropathy, unspecified: Secondary | ICD-10-CM | POA: Insufficient documentation

## 2021-09-12 DIAGNOSIS — Z86010 Personal history of colonic polyps: Secondary | ICD-10-CM | POA: Diagnosis not present

## 2021-09-12 DIAGNOSIS — Z9049 Acquired absence of other specified parts of digestive tract: Secondary | ICD-10-CM | POA: Insufficient documentation

## 2021-09-12 DIAGNOSIS — J449 Chronic obstructive pulmonary disease, unspecified: Secondary | ICD-10-CM | POA: Diagnosis not present

## 2021-09-12 DIAGNOSIS — K219 Gastro-esophageal reflux disease without esophagitis: Secondary | ICD-10-CM | POA: Insufficient documentation

## 2021-09-12 LAB — CBC WITH DIFFERENTIAL (CANCER CENTER ONLY)
Abs Immature Granulocytes: 0.02 10*3/uL (ref 0.00–0.07)
Basophils Absolute: 0 10*3/uL (ref 0.0–0.1)
Basophils Relative: 0 %
Eosinophils Absolute: 0.3 10*3/uL (ref 0.0–0.5)
Eosinophils Relative: 4 %
HCT: 44 % (ref 36.0–46.0)
Hemoglobin: 14.2 g/dL (ref 12.0–15.0)
Immature Granulocytes: 0 %
Lymphocytes Relative: 16 %
Lymphs Abs: 1.2 10*3/uL (ref 0.7–4.0)
MCH: 30.4 pg (ref 26.0–34.0)
MCHC: 32.3 g/dL (ref 30.0–36.0)
MCV: 94.2 fL (ref 80.0–100.0)
Monocytes Absolute: 0.9 10*3/uL (ref 0.1–1.0)
Monocytes Relative: 11 %
Neutro Abs: 5.5 10*3/uL (ref 1.7–7.7)
Neutrophils Relative %: 69 %
Platelet Count: 214 10*3/uL (ref 150–400)
RBC: 4.67 MIL/uL (ref 3.87–5.11)
RDW: 14.2 % (ref 11.5–15.5)
WBC Count: 8 10*3/uL (ref 4.0–10.5)
nRBC: 0 % (ref 0.0–0.2)

## 2021-09-12 LAB — CMP (CANCER CENTER ONLY)
ALT: 14 U/L (ref 0–44)
AST: 15 U/L (ref 15–41)
Albumin: 3.5 g/dL (ref 3.5–5.0)
Alkaline Phosphatase: 52 U/L (ref 38–126)
Anion gap: 6 (ref 5–15)
BUN: 19 mg/dL (ref 8–23)
CO2: 30 mmol/L (ref 22–32)
Calcium: 9.3 mg/dL (ref 8.9–10.3)
Chloride: 107 mmol/L (ref 98–111)
Creatinine: 0.94 mg/dL (ref 0.44–1.00)
GFR, Estimated: 60 mL/min — ABNORMAL LOW (ref >60)
Glucose, Bld: 140 mg/dL — ABNORMAL HIGH (ref 70–99)
Potassium: 3.6 mmol/L (ref 3.5–5.1)
Sodium: 143 mmol/L (ref 135–145)
Total Bilirubin: 0.6 mg/dL (ref 0.3–1.2)
Total Protein: 5.9 g/dL — ABNORMAL LOW (ref 6.5–8.1)

## 2021-09-12 MED ORDER — TAMOXIFEN CITRATE 20 MG PO TABS: 20.0000 mg | ORAL_TABLET | Freq: Every day | ORAL | 3 refills | Status: AC

## 2021-09-26 DIAGNOSIS — H40013 Open angle with borderline findings, low risk, bilateral: Secondary | ICD-10-CM | POA: Diagnosis not present

## 2021-09-26 DIAGNOSIS — H26493 Other secondary cataract, bilateral: Secondary | ICD-10-CM | POA: Diagnosis not present

## 2021-09-26 DIAGNOSIS — Z961 Presence of intraocular lens: Secondary | ICD-10-CM | POA: Diagnosis not present

## 2021-09-26 DIAGNOSIS — H43813 Vitreous degeneration, bilateral: Secondary | ICD-10-CM | POA: Diagnosis not present

## 2021-09-28 DIAGNOSIS — Z0100 Encounter for examination of eyes and vision without abnormal findings: Secondary | ICD-10-CM | POA: Diagnosis not present

## 2021-10-06 DIAGNOSIS — M2042 Other hammer toe(s) (acquired), left foot: Secondary | ICD-10-CM | POA: Diagnosis not present

## 2021-10-06 DIAGNOSIS — M19072 Primary osteoarthritis, left ankle and foot: Secondary | ICD-10-CM | POA: Diagnosis not present

## 2021-10-06 DIAGNOSIS — M792 Neuralgia and neuritis, unspecified: Secondary | ICD-10-CM | POA: Diagnosis not present

## 2021-10-06 DIAGNOSIS — M19071 Primary osteoarthritis, right ankle and foot: Secondary | ICD-10-CM | POA: Diagnosis not present

## 2021-10-06 DIAGNOSIS — L602 Onychogryphosis: Secondary | ICD-10-CM | POA: Diagnosis not present

## 2021-10-06 DIAGNOSIS — M2041 Other hammer toe(s) (acquired), right foot: Secondary | ICD-10-CM | POA: Diagnosis not present

## 2021-10-06 DIAGNOSIS — I739 Peripheral vascular disease, unspecified: Secondary | ICD-10-CM | POA: Diagnosis not present

## 2021-10-07 ENCOUNTER — Encounter: Payer: Self-pay | Admitting: Emergency Medicine

## 2021-10-07 ENCOUNTER — Ambulatory Visit (INDEPENDENT_AMBULATORY_CARE_PROVIDER_SITE_OTHER): Payer: Medicare HMO | Admitting: Emergency Medicine

## 2021-10-07 DIAGNOSIS — K219 Gastro-esophageal reflux disease without esophagitis: Secondary | ICD-10-CM

## 2021-10-07 DIAGNOSIS — J410 Simple chronic bronchitis: Secondary | ICD-10-CM

## 2021-10-07 DIAGNOSIS — J301 Allergic rhinitis due to pollen: Secondary | ICD-10-CM

## 2021-10-07 MED ORDER — STIOLTO RESPIMAT 2.5-2.5 MCG/ACT IN AERS: 2.0000 | INHALATION_SPRAY | Freq: Every day | RESPIRATORY_TRACT | 0 refills | Status: DC

## 2021-10-07 NOTE — Patient Instructions (Addendum)
We will try starting Stiolto 2 puffs once daily.  If you benefit from this medication then we will continue it going forward. ?Keep albuterol available to use 2 puffs up to every 4 hours if needed for shortness of breath, chest tightness, wheezing.  ?Continue Allegra once daily. ?Continue fluticasone nasal spray, 2 sprays each nostril once daily. ?Continue your Protonix as you have been taking it. ?Please follow-up with either RB or APP in 3 weeks to assess your improvement. ?We may decide to repeat pulmonary function testing at some point going forward to compare with your prior. ? ?

## 2021-10-07 NOTE — Progress Notes (Signed)
? ?Subjective:  ? ? Patient ID: Sheila Shields, female    DOB: [birth year], 87 y.o.   MRN: [id number] ? ?COPD ?She complains of cough and shortness of breath. There is no wheezing. Pertinent negatives include no ear pain, fever, headaches, myalgias, postnasal drip, rhinorrhea, sneezing, sore throat or trouble swallowing. Her past medical history is significant for COPD.  ? ?ROV 09/24/2018 --follow-up visit for 87 year old woman with a history of mild COPD confirmed on spirometry, upper airway irritability syndrome with chronic cough in the setting of GERD and chronic rhinitis.  She was seen here acutely 08/09/2018 with an increase in her cough following an upper respiratory infection.  She was treated with prednisone and doxycycline, chest x-ray reassuring.  She was continued on Claritin, flonase, Zantac, protonix, added Tessalon as needed and also a brief course of Asmanex, now off. She is on albuterol prn > uses rarely. She wonders about changing the tessalon to prn.  ? ? ?ROV 10/07/21 --pleasant 87 year old woman whom I have followed for mild COPD with documented obstruction.  Also with upper airway irritability and chronic cough in the setting of chronic rhinitis and GERD.  It has been several years since have seen her.  She reports ?She is on Allegra, fluticasone nasal spray, Protonix 20 mg daily.  She has albuterol available to use as needed, uses 2 x a day.  ?She reports that she has had multiple flares since November. She moved into a new home around that time. She developed SOB with normal activities. Intermittent wheeze, cough, can be worse at night. She was treated with abx and pred then and on 2 more occasions in December and January. She is improved, but still has exertional SOB. She has seen desaturations. She tried Trelegy x 2 months, felt that she benefited.  Chest x-ray was done, report is normal ? ? ? ?Past Medical History:  ?Diagnosis Date  ? 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 Anemia   ?  Arthritis   ? OA  ? Breast cancer ([place])   ? left breast  ? COPD 12/17/2006  ? Diverticulosis 03/27/2007  ? GERD 05/01/2007  ? HYPERGLYCEMIA 08/27/2007  ? HYPERLIPIDEMIA 12/17/2006  ? HYPERTENSION 12/17/2006  ? Mood disorder ([place])   ? hx anxiety and depression. meds short term during life transition  ? Pneumonia   ? AS A CHILD  ? Rectal polyp 03/27/2007  ? adenoma  ? SOB (shortness of breath) on exertion   ? per patient due to having COPD  ? TIA (transient ischemic attack)   ? ? ? ?Review of Systems  ?Constitutional:  Negative for fever and unexpected weight change.  ?HENT:  Negative for congestion, dental problem, ear pain, nosebleeds, postnasal drip, rhinorrhea, sinus pressure, sneezing, sore throat and trouble swallowing.   ?Eyes:  Negative for redness and itching.  ?Respiratory:  Positive for cough and shortness of breath. Negative for chest tightness and wheezing.   ?Cardiovascular:  Negative for palpitations and leg swelling.  ?Gastrointestinal:  Negative for nausea and vomiting.  ?Genitourinary:  Negative for dysuria.  ?Musculoskeletal:  Negative for arthralgias, joint swelling and myalgias.  ?Skin:  Negative for rash.  ?Neurological:  Negative for headaches.  ?Hematological:  Does not bruise/bleed easily.  ?Psychiatric/Behavioral:  Negative for dysphoric mood. The patient is not nervous/anxious.   ? ?Past Medical History:  ?Diagnosis Date  ? 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 Anemia   ? Arthritis   ? OA  ? Breast cancer ([place])   ?  left breast  ? COPD 12/17/2006  ? Diverticulosis 03/27/2007  ? GERD 05/01/2007  ? HYPERGLYCEMIA 08/27/2007  ? HYPERLIPIDEMIA 12/17/2006  ? HYPERTENSION 12/17/2006  ? Mood disorder ([place])   ? hx anxiety and depression. meds short term during life transition  ? Pneumonia   ? AS A CHILD  ? Rectal polyp 03/27/2007  ? adenoma  ? SOB (shortness of breath) on exertion   ? per patient due to having COPD  ? TIA (transient ischemic attack)   ?  ? ?Family History  ?Problem Relation Age of Onset  ? Throat  cancer Mother   ?     1976  ? Heart attack Father 46  ? Idiopathic pulmonary fibrosis Brother 57  ? Cancer Brother   ?     sarcoma  ? Heart disease Brother   ? Colon cancer Neg Hx   ? Esophageal cancer Neg Hx   ? Stomach cancer Neg Hx   ?  ? ?Social History  ? ?Socioeconomic History  ? Marital status: Married  ?  Spouse name: Not on file  ? Number of children: 2  ? Years of education: Not on file  ? Highest education level: Not on file  ?Occupational History  ? Occupation: retired  ?Tobacco Use  ? Smoking status: Former  ?  Packs/day: 2.00  ?  Years: 30.00  ?  Pack years: 60.00  ?  Types: Cigarettes  ?  Quit date: 07/10/1984  ?  Years since quitting: 37.2  ? Smokeless tobacco: Never  ?Vaping Use  ? Vaping Use: Never used  ?Substance and Sexual Activity  ? Alcohol use: No  ?  Alcohol/week: 0.0 standard drinks  ?  Comment: no alcoho since 1983  ? Drug use: No  ? Sexual activity: Not on file  ?Other Topics Concern  ? Not on file  ?Social History Narrative  ? Home Situation: lives with husband. 2 daughters, 2 step daughters, 6 grandkids. 2 greatgrandkids   ? Work or School: very active in [employer] and church  ?   ? Spiritual Beliefs: Christian  ?   ? Lifestyle:tries to walk; diet ok - denies any alcohol or tobacco use in many many years  ?   ? Caffeine 1 cup coffee/day  ?   ? Hobbies: news junkie, audio books  ?   ? Left Handed  ?   ? Lives in one story home  ? ?Social Determinants of Health  ? ?Financial Resource Strain: Low Risk   ? Difficulty of Paying Living Expenses: Not hard at all  ?Food Insecurity: No Food Insecurity  ? Worried About Charity fundraiser in the Last Year: Never true  ? Ran Out of Food in the Last Year: Never true  ?Transportation Needs: No Transportation Needs  ? Lack of Transportation (Medical): No  ? Lack of Transportation (Non-Medical): No  ?Physical Activity: Inactive  ? Days of Exercise per Week: 0 days  ? Minutes of Exercise per Session: 0 min  ?Stress: No Stress Concern Present  ? Feeling of  Stress : Not at all  ?Social Connections: Socially Integrated  ? Frequency of Communication with Friends and Family: More than three times a week  ? Frequency of Social Gatherings with Friends and Family: More than three times a week  ? Attends Religious Services: More than 4 times per year  ? Active Member of Clubs or Organizations: Yes  ? Attends Archivist Meetings: 1 to 4 times per year  ?  Marital Status: Married  ?Intimate Partner Violence: Not At Risk  ? Fear of Current or Ex-Partner: No  ? Emotionally Abused: No  ? Physically Abused: No  ? Sexually Abused: No  ?  ? ?Allergies  ?Allergen Reactions  ? Alcohol Other (See Comments)  ?  Recovering Alcoholic*  ? Codeine   ?  Patient prefers not to take this due to history of alcoholism  ? Dilaudid [Hydromorphone Hcl]   ?  Pt had hypotension after dilaudid 1mg  IV while in the OR, required temporary pressor intervention.  ?  ? ?Outpatient Medications Prior to Visit  ?Medication Sig Dispense Refill  ? acetaminophen (TYLENOL) 650 MG CR tablet Take 1,300 mg by mouth 2 (two) times daily.    ? albuterol (PROAIR HFA) 108 (90 Base) MCG/ACT inhaler INHALE 2 PUFFS EVERY 4 HOURS AS NEEDED FOR COUGHING SPELLS 18 g 5  ? aspirin 325 MG tablet Take 1 tablet (325 mg total) by mouth daily. 30 tablet 3  ? clopidogrel (PLAVIX) 75 MG tablet Take 0.5 tablets (37.5 mg total) by mouth daily. 46 tablet 3  ? Coenzyme Q10 (COQ-10 PO) Take 1 capsule by mouth daily.    ? Cyanocobalamin (VITAMIN B-12 PO) Take 1 tablet by mouth daily.    ? diclofenac Sodium (VOLTAREN) 1 % GEL Apply 2 g topically 4 (four) times daily. (Patient taking differently: Apply 2 g topically 4 (four) times daily as needed (pain).) 100 g 11  ? fexofenadine (ALLEGRA) 180 MG tablet Take 180 mg by mouth daily.    ? fluticasone (FLONASE) 50 MCG/ACT nasal spray Place 2 sprays into both nostrils daily. 16 g 2  ? hydrochlorothiazide (HYDRODIURIL) 25 MG tablet Take 0.5 tablets (12.5 mg total) by mouth daily. 46 tablet 3   ? Multiple Vitamin (MULTIVITAMIN WITH MINERALS) TABS tablet Take 1 tablet by mouth daily.    ? pantoprazole (PROTONIX) 20 MG tablet Take 1 tablet (20 mg total) by mouth daily. 90 tablet 3  ? potassium chloride S

## 2021-10-07 NOTE — Assessment & Plan Note (Signed)
Continue your Protonix as you have been taking it. ?

## 2021-10-07 NOTE — Assessment & Plan Note (Signed)
Continue Allegra once daily. ?Continue fluticasone nasal spray, 2 sprays each nostril once daily. ?

## 2021-10-07 NOTE — Assessment & Plan Note (Signed)
Progressive dyspnea and flaring symptoms over the last 4 to 5 months.  Has been treated for possible acute exacerbations, may have had one but her more recent symptoms sound subacute.  She notes that she probably got some benefit from Trelegy when she tried it temporarily.  I would like to try her on Stiolto, not clear to me that she needs the ICS because she was not a person who flare frequently before the last few months.  Also would like to avoid the powdered inhaler because she has some upper airway lability. ? ?We will try starting Stiolto 2 puffs once daily.  If you benefit from this medication then we will continue it going forward. ?Keep albuterol available to use 2 puffs up to every 4 hours if needed for shortness of breath, chest tightness, wheezing.  ?Please follow-up with either RB or APP in 3 weeks to assess your improvement. ?We may decide to repeat pulmonary function testing at some point going forward to compare with your prior. ?

## 2021-10-10 ENCOUNTER — Telehealth: Payer: Self-pay

## 2021-10-10 ENCOUNTER — Other Ambulatory Visit (HOSPITAL_COMMUNITY): Payer: Self-pay

## 2021-10-10 NOTE — Telephone Encounter (Signed)
The 2 preferred meds. are Anoro & Bevespi.  Each have a $47 copay. ?

## 2021-10-11 NOTE — Telephone Encounter (Signed)
I would prefer to try the Bevespi since it is not powdered.  If she is willing to do so lets start Bevespi 2 puffs twice a day. ?

## 2021-10-12 ENCOUNTER — Ambulatory Visit: Payer: Medicare HMO | Admitting: Emergency Medicine

## 2021-10-12 MED ORDER — BEVESPI AEROSPHERE 9-4.8 MCG/ACT IN AERO: 2.0000 | INHALATION_SPRAY | Freq: Two times a day (BID) | RESPIRATORY_TRACT | 5 refills | Status: DC

## 2021-10-12 NOTE — Telephone Encounter (Signed)
Called and spoke with patient. She verbalized understanding of the Bevespi and the instructions. RX has been sent to [facility] on [address].  ? ?Nothing further needed at time of call.  ?

## 2021-10-12 NOTE — Addendum Note (Signed)
Addended by: Valerie Salts on: 10/12/2021 01:39 PM ? ? Modules accepted: Orders ? ?

## 2021-10-20 ENCOUNTER — Telehealth (HOSPITAL_COMMUNITY): Payer: Self-pay

## 2021-10-20 ENCOUNTER — Other Ambulatory Visit (HOSPITAL_COMMUNITY): Payer: Self-pay | Admitting: Interventional Radiology

## 2021-10-20 DIAGNOSIS — I671 Cerebral aneurysm, nonruptured: Secondary | ICD-10-CM

## 2021-10-20 NOTE — Telephone Encounter (Signed)
Called to schedule angiogram, no answer, left vm. AW 

## 2021-10-24 ENCOUNTER — Other Ambulatory Visit: Payer: Self-pay | Admitting: Radiology

## 2021-10-25 ENCOUNTER — Ambulatory Visit (HOSPITAL_COMMUNITY)
Admission: RE | Admit: 2021-10-25 | Discharge: 2021-10-25 | Disposition: A | Discharge status: Home / Self Care | Payer: Medicare HMO | Source: Ambulatory Visit | Attending: Interventional Radiology | Admitting: Interventional Radiology

## 2021-10-25 ENCOUNTER — Other Ambulatory Visit: Payer: Self-pay

## 2021-10-25 DIAGNOSIS — I671 Cerebral aneurysm, nonruptured: Secondary | ICD-10-CM | POA: Diagnosis not present

## 2021-10-25 DIAGNOSIS — Z7902 Long term (current) use of antithrombotics/antiplatelets: Secondary | ICD-10-CM | POA: Diagnosis not present

## 2021-10-25 DIAGNOSIS — J449 Chronic obstructive pulmonary disease, unspecified: Secondary | ICD-10-CM | POA: Diagnosis not present

## 2021-10-25 DIAGNOSIS — I6521 Occlusion and stenosis of right carotid artery: Secondary | ICD-10-CM | POA: Diagnosis not present

## 2021-10-25 DIAGNOSIS — K219 Gastro-esophageal reflux disease without esophagitis: Secondary | ICD-10-CM | POA: Insufficient documentation

## 2021-10-25 DIAGNOSIS — Z7982 Long term (current) use of aspirin: Secondary | ICD-10-CM | POA: Insufficient documentation

## 2021-10-25 DIAGNOSIS — Z8673 Personal history of transient ischemic attack (TIA), and cerebral infarction without residual deficits: Secondary | ICD-10-CM | POA: Diagnosis not present

## 2021-10-25 DIAGNOSIS — Z87891 Personal history of nicotine dependence: Secondary | ICD-10-CM | POA: Insufficient documentation

## 2021-10-25 DIAGNOSIS — Z853 Personal history of malignant neoplasm of breast: Secondary | ICD-10-CM | POA: Diagnosis not present

## 2021-10-25 HISTORY — PX: IR ANGIO INTRA EXTRACRAN SEL COM CAROTID INNOMINATE BILAT MOD SED: IMG5360

## 2021-10-25 LAB — BASIC METABOLIC PANEL
Anion gap: 11 (ref 5–15)
BUN: 16 mg/dL (ref 8–23)
CO2: 27 mmol/L (ref 22–32)
Calcium: 9 mg/dL (ref 8.9–10.3)
Chloride: 102 mmol/L (ref 98–111)
Creatinine, Ser: 0.86 mg/dL (ref 0.44–1.00)
GFR, Estimated: >60 mL/min (ref >60)
Glucose, Bld: 103 mg/dL — ABNORMAL HIGH (ref 70–99)
Potassium: 4 mmol/L (ref 3.5–5.1)
Sodium: 140 mmol/L (ref 135–145)

## 2021-10-25 LAB — CBC
HCT: 42.1 % (ref 36.0–46.0)
Hemoglobin: 14 g/dL (ref 12.0–15.0)
MCH: 31.7 pg (ref 26.0–34.0)
MCHC: 33.3 g/dL (ref 30.0–36.0)
MCV: 95.5 fL (ref 80.0–100.0)
Platelets: 243 10*3/uL (ref 150–400)
RBC: 4.41 MIL/uL (ref 3.87–5.11)
RDW: 13.6 % (ref 11.5–15.5)
WBC: 6.5 10*3/uL (ref 4.0–10.5)
nRBC: 0 % (ref 0.0–0.2)

## 2021-10-25 LAB — PROTIME-INR
INR: 1.2 (ref 0.8–1.2)
Prothrombin Time: 14.6 seconds (ref 11.4–15.2)

## 2021-10-25 MED ORDER — HEPARIN SOD (PORK) LOCK FLUSH 100 UNIT/ML IV SOLN
INTRAVENOUS | Status: AC
  Filled 2021-10-25: qty 10

## 2021-10-25 MED ORDER — FENTANYL CITRATE (PF) 100 MCG/2ML IJ SOLN
INTRAMUSCULAR | Status: AC | PRN
  Administered 2021-10-25: 25 ug via INTRAVENOUS

## 2021-10-25 MED ORDER — SODIUM CHLORIDE 0.9 % IV SOLN: INTRAVENOUS | Status: AC

## 2021-10-25 MED ORDER — IOHEXOL 300 MG/ML  SOLN: 100.0000 mL | Freq: Once | INTRAMUSCULAR | Status: DC | PRN

## 2021-10-25 MED ORDER — MIDAZOLAM HCL 2 MG/2ML IJ SOLN
INTRAMUSCULAR | Status: AC
  Filled 2021-10-25: qty 2

## 2021-10-25 MED ORDER — MIDAZOLAM HCL 2 MG/2ML IJ SOLN
INTRAMUSCULAR | Status: AC | PRN
  Administered 2021-10-25: 1 mg via INTRAVENOUS

## 2021-10-25 MED ORDER — LIDOCAINE HCL 1 % IJ SOLN
INTRAMUSCULAR | Status: AC
  Filled 2021-10-25: qty 20

## 2021-10-25 MED ORDER — HEPARIN SODIUM (PORCINE) 1000 UNIT/ML IJ SOLN
INTRAMUSCULAR | Status: AC | PRN
  Administered 2021-10-25: 1000 [IU] via INTRAVENOUS

## 2021-10-25 MED ORDER — SODIUM CHLORIDE 0.9 % IV SOLN: Freq: Once | INTRAVENOUS | Status: AC

## 2021-10-25 MED ORDER — FENTANYL CITRATE (PF) 100 MCG/2ML IJ SOLN
INTRAMUSCULAR | Status: AC
  Filled 2021-10-25: qty 2

## 2021-10-25 NOTE — H&P (Signed)
? 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 Patient presents for cerebral angiogram ? ?Supervising Physician: Luanne Bras ? ?Patient Status: [facility] - Out-pt ? ?History of Present Illness: ?Sheila Shields is a 87 y.o. female outpatient. History of left breast cancer s/p lumpectomy, GERD, COPD,  TIA. 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 Estanislado Pandy. Last cerebral angiogram performed on 9.9.22 shows the obliterated previously treated right carotid artery aneurysm with flow diverter. Evaluation of the left common carotid artery and left vertebral artery unsuccessful via the trans radial access due to tortuous vessel anatomy. Patient presents for cerebral angiogram.  ? ?Patient alert and laying in bed, calm and comfortable. Currently without any significant complaints. Endorses frontal headache over left eye that she states is related to allergies. Denies any fevers, headache, chest pain, SOB, cough, abdominal pain, nausea, vomiting or bleeding. Return precautions and treatment recommendations and follow-up discussed with the patient  who is agreeable with the plan. ? ?Past Medical History:  ?Diagnosis Date  ? 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 Anemia   ? Arthritis   ? OA  ? Breast cancer ([place])   ? left breast  ? COPD 12/17/2006  ? Diverticulosis 03/27/2007  ? GERD 05/01/2007  ? HYPERGLYCEMIA 08/27/2007  ? HYPERLIPIDEMIA 12/17/2006  ? HYPERTENSION 12/17/2006  ? Mood disorder ([place])   ? hx anxiety and depression. meds short term during life transition  ? Pneumonia   ? AS A CHILD  ? Rectal polyp 03/27/2007  ? adenoma  ? SOB (shortness of breath) on exertion   ? per patient due to having COPD  ? TIA (transient ischemic attack)   ? ? ?Past Surgical History:  ?Procedure Laterality Date  ? ABDOMINAL HYSTERECTOMY  1978  ? fibroma  ? APPENDECTOMY  2002  ? BREAST  EXCISIONAL BIOPSY Right 2003  ? negative  ? BREAST LUMPECTOMY WITH RADIOACTIVE SEED LOCALIZATION Left 03/23/2020  ? Procedure: LEFT BREAST LUMPECTOMY WITH RADIOACTIVE SEED LOCALIZATION;  Surgeon: [name], MD;  Location: [facility];  Service: General;  Laterality: Left;  RNFA  ? CARPAL TUNNEL RELEASE Left 2010 or 2011  ? CATARACT EXTRACTION W/ INTRAOCULAR LENS  IMPLANT, BILATERAL Bilateral   ? COLONOSCOPY W/ POLYPECTOMY  03/27/2007; n7/19/2012  ? 2008: 4 mm adenoma, diverticulosis 2012: ileitis ? NSAID - likely, 2-3 mm cecal polyp LYMPHOID FOLLICLE, diverticulosis  ? ESOPHAGOGASTRODUODENOSCOPY  01/26/2011  ? reflux esophagitis, colonscopy done also  ? HAMMER TOE SURGERY  2008  ? left foot  ? HERNIA REPAIR Right 1996?  ? IR ANGIO INTRA EXTRACRAN SEL COM CAROTID INNOMINATE UNI R MOD SED  11/28/2018  ? IR ANGIO INTRA EXTRACRAN SEL COM CAROTID INNOMINATE UNI R MOD SED  03/17/2021  ? IR ANGIO INTRA EXTRACRAN SEL INTERNAL CAROTID UNI R MOD SED  12/30/2018  ? IR ANGIO VERTEBRAL SEL SUBCLAVIAN INNOMINATE UNI R MOD SED  11/28/2018  ? IR ANGIO VERTEBRAL SEL VERTEBRAL UNI R MOD SED  03/18/2021  ? IR ANGIOGRAM FOLLOW UP STUDY  12/30/2018  ? IR TRANSCATH/EMBOLIZ  12/30/2018  ? IR US GUIDE VASC ACCESS RIGHT  11/28/2018  ? IR US GUIDE VASC ACCESS RIGHT  03/17/2021  ? ORIF ANKLE FRACTURE Right 12/22/2014  ? Procedure: OPEN REDUCTION INTERNAL FIXATION (ORIF) ANKLE FRACTURE;  Surgeon: Susa Day, MD;  Location: [facility];  Service: Orthopedics;  Laterality: Right;  ? RADIOLOGY WITH ANESTHESIA N/A 12/30/2018  ? Procedure: RADIOLOGY  WITH ANESTHESIA;  Surgeon: Luanne Bras, MD;  Location: [facility];  Service: Radiology;  Laterality: N/A;  ? SHOULDER OPEN ROTATOR CUFF REPAIR Left 03/06/2013  ? Procedure: LEFT ROTATOR CUFF REPAIR, SUBACROMIAL DECOMPRESSION, PATCH GRAFT, MANIPULATION UNDER ANESTHESIA;  Surgeon: Johnn Hai, MD;  Location: [facility];  Service: Orthopedics;  Laterality: Left;  ? TONSILLECTOMY    ? ? ?Allergies: ?Alcohol, Cheese, Codeine,  and Dilaudid [hydromorphone hcl] ? ?Medications: ?Prior to Admission medications   ?Medication Sig Start Date End Date Taking? Authorizing Provider  ?acetaminophen (TYLENOL) 650 MG CR tablet Take 1,300 mg by mouth 2 (two) times daily.   Yes [provider]  ?aspirin 325 MG tablet Take 1 tablet (325 mg total) by mouth daily. 01/01/19  Yes Louk, Bea Graff, PA-C  ?Cholecalciferol (VITAMIN D) 50 MCG (2000 UT) tablet Take 2,000 Units by mouth daily.   Yes [provider]  ?clopidogrel (PLAVIX) 75 MG tablet Take 0.5 tablets (37.5 mg total) by mouth daily. 08/03/21  Yes Marin Olp, MD  ?Coenzyme Q10 300 MG CAPS Take 300 mg by mouth daily.   Yes [provider]  ?fexofenadine (ALLEGRA) 180 MG tablet Take 180 mg by mouth daily.   Yes [provider]  ?fluticasone (FLONASE) 50 MCG/ACT nasal spray Place 2 sprays into both nostrils daily. 09/24/18  Yes Collene Gobble, MD  ?Glycopyrrolate-Formoterol (BEVESPI AEROSPHERE) 9-4.8 MCG/ACT AERO Inhale 2 puffs into the lungs 2 (two) times daily. 10/12/21  Yes Collene Gobble, MD  ?hydrochlorothiazide (HYDRODIURIL) 25 MG tablet Take 0.5 tablets (12.5 mg total) by mouth daily. 08/03/21  Yes Marin Olp, MD  ?Multiple Vitamin (MULTIVITAMIN WITH MINERALS) TABS tablet Take 1 tablet by mouth daily.   Yes [provider]  ?pantoprazole (PROTONIX) 20 MG tablet Take 1 tablet (20 mg total) by mouth daily. 08/03/21  Yes Marin Olp, MD  ?potassium chloride SA (KLOR-CON M20) 20 MEQ tablet Take 1 tablet (20 mEq total) by mouth every other day. 11/23/20  Yes Marin Olp, MD  ?Propylene Glycol (SYSTANE BALANCE OP) Place 1 drop into both eyes daily.   Yes [provider]  ?rosuvastatin (CRESTOR) 20 MG tablet Take 1 tablet (20 mg total) by mouth daily. 08/03/21  Yes Marin Olp, MD  ?tamoxifen (NOLVADEX) 20 MG tablet Take 1 tablet (20 mg total) by mouth daily. 09/12/21  Yes Alla Feeling, NP  ?traZODone (DESYREL) 50 MG  tablet TAKE 1 TABLET BY MOUTH EVERY NIGHT AT BEDTIME AS NEEDED FOR SLEEP ?Patient taking differently: Take 50 mg by mouth at bedtime. 08/15/21  Yes Marin Olp, MD  ?Turmeric (QC TUMERIC COMPLEX PO) Take 1,500 mg by mouth daily.   Yes [provider]  ?vitamin B-12 (CYANOCOBALAMIN) 1000 MCG tablet Take 1,000 mcg by mouth daily.   Yes [provider]  ?albuterol (PROAIR HFA) 108 (90 Base) MCG/ACT inhaler INHALE 2 PUFFS EVERY 4 HOURS AS NEEDED FOR COUGHING SPELLS 05/24/20   Marin Olp, MD  ?traMADol (ULTRAM) 50 MG tablet TAKE ONE TABLET BY MOUTH EVERY 8 HOURS AS NEEDED FOR PAIN FOR UP TO 5 DAYS (DO NOT DRIVE FOR 8 HOURS AFTER TAKING) 08/15/21   Marin Olp, MD  ?  ? ?Family History  ?Problem Relation Age of Onset  ? Throat cancer Mother   ?     1976  ? Heart attack Father 63  ? Idiopathic pulmonary fibrosis Brother 20  ? Cancer Brother   ?     sarcoma  ?  Heart disease Brother   ? Colon cancer Neg Hx   ? Esophageal cancer Neg Hx   ? Stomach cancer Neg Hx   ? ? ?Social History  ? ?Socioeconomic History  ? Marital status: Married  ?  Spouse name: Not on file  ? Number of children: 2  ? Years of education: Not on file  ? Highest education level: Not on file  ?Occupational History  ? Occupation: retired  ?Tobacco Use  ? Smoking status: Former  ?  Packs/day: 2.00  ?  Years: 30.00  ?  Pack years: 60.00  ?  Types: Cigarettes  ?  Quit date: 07/10/1984  ?  Years since quitting: 37.3  ? Smokeless tobacco: Never  ?Vaping Use  ? Vaping Use: Never used  ?Substance and Sexual Activity  ? Alcohol use: No  ?  Alcohol/week: 0.0 standard drinks  ?  Comment: no alcoho since 1983  ? Drug use: No  ? Sexual activity: Not on file  ?Other Topics Concern  ? Not on file  ?Social History Narrative  ? Home Situation: lives with husband. 2 daughters, 2 step daughters, 6 grandkids. 2 greatgrandkids   ? Work or School: very active in [employer] and church  ?   ? Spiritual Beliefs: Christian  ?   ? Lifestyle:tries to walk; diet  ok - denies any alcohol or tobacco use in many many years  ?   ? Caffeine 1 cup coffee/day  ?   ? Hobbies: news junkie, audio books  ?   ? Left Handed  ?   ? Lives in one story home  ? ?Social Determinants o

## 2021-10-25 NOTE — Sedation Documentation (Signed)
Patient is resting comfortably. VSS °

## 2021-10-25 NOTE — Sedation Documentation (Signed)
Patient is resting comfortably. Groin unremarkable at hand off to [facility] RN ?

## 2021-10-25 NOTE — Sedation Documentation (Signed)
Vital signs stable. 

## 2021-10-25 NOTE — Sedation Documentation (Signed)
Patient is resting comfortably. Asleep. Procedure continues ?

## 2021-10-25 NOTE — Progress Notes (Signed)
Patient was given discharge instructions. She verbalized understanding. 

## 2021-10-25 NOTE — Sedation Documentation (Signed)
Patient is resting comfortably. Increased O2 ?

## 2021-10-25 NOTE — Procedures (Signed)
INR.  ?Bilateral common carotid arteriograms.   ?Right CFA approach. ?Findings ?1.  Patent previously positioned flow diverter across the right internal carotid artery supraclinoid aneurysm. ?2.  No opacification seen in the region of the previously treated aneurysm. ?3 .approximately 3.2 mm x 3.3 mm left internal carotid artery caval cavernous aneurysm. ?4.  Less than 20% stenosis of the right internal carotid artery proximally associated with a small ulcerated plaque. ?Arlean Hopping MD ?

## 2021-10-26 ENCOUNTER — Other Ambulatory Visit (HOSPITAL_COMMUNITY): Payer: Self-pay | Admitting: Interventional Radiology

## 2021-10-26 DIAGNOSIS — I671 Cerebral aneurysm, nonruptured: Secondary | ICD-10-CM

## 2021-10-26 HISTORY — PX: IR ANGIO VERTEBRAL SEL SUBCLAVIAN INNOMINATE UNI R MOD SED: IMG5365

## 2021-10-28 ENCOUNTER — Ambulatory Visit (HOSPITAL_BASED_OUTPATIENT_CLINIC_OR_DEPARTMENT_OTHER)
Admission: RE | Admit: 2021-10-28 | Discharge: 2021-10-28 | Disposition: A | Discharge status: Home / Self Care | Payer: Medicare HMO | Source: Ambulatory Visit | Attending: Nurse Practitioner | Admitting: Nurse Practitioner

## 2021-10-28 ENCOUNTER — Ambulatory Visit (INDEPENDENT_AMBULATORY_CARE_PROVIDER_SITE_OTHER): Payer: Medicare HMO

## 2021-10-28 ENCOUNTER — Encounter (HOSPITAL_BASED_OUTPATIENT_CLINIC_OR_DEPARTMENT_OTHER): Payer: Self-pay

## 2021-10-28 ENCOUNTER — Ambulatory Visit: Payer: Medicare HMO | Admitting: Nurse Practitioner

## 2021-10-28 ENCOUNTER — Encounter: Payer: Self-pay | Admitting: Nurse Practitioner

## 2021-10-28 VITALS — BP 108/72 | HR 84 | Ht 65.5 in | Wt 173.4 lb

## 2021-10-28 DIAGNOSIS — J449 Chronic obstructive pulmonary disease, unspecified: Secondary | ICD-10-CM

## 2021-10-28 DIAGNOSIS — J9601 Acute respiratory failure with hypoxia: Secondary | ICD-10-CM

## 2021-10-28 DIAGNOSIS — J410 Simple chronic bronchitis: Secondary | ICD-10-CM | POA: Diagnosis not present

## 2021-10-28 DIAGNOSIS — R079 Chest pain, unspecified: Secondary | ICD-10-CM | POA: Diagnosis not present

## 2021-10-28 DIAGNOSIS — J9610 Chronic respiratory failure, unspecified whether with hypoxia or hypercapnia: Secondary | ICD-10-CM | POA: Insufficient documentation

## 2021-10-28 DIAGNOSIS — J301 Allergic rhinitis due to pollen: Secondary | ICD-10-CM

## 2021-10-28 DIAGNOSIS — J9611 Chronic respiratory failure with hypoxia: Secondary | ICD-10-CM | POA: Diagnosis not present

## 2021-10-28 DIAGNOSIS — Z87891 Personal history of nicotine dependence: Secondary | ICD-10-CM | POA: Diagnosis not present

## 2021-10-28 DIAGNOSIS — R0602 Shortness of breath: Secondary | ICD-10-CM | POA: Diagnosis not present

## 2021-10-28 DIAGNOSIS — R058 Other specified cough: Secondary | ICD-10-CM

## 2021-10-28 DIAGNOSIS — J439 Emphysema, unspecified: Secondary | ICD-10-CM

## 2021-10-28 DIAGNOSIS — R0609 Other forms of dyspnea: Secondary | ICD-10-CM | POA: Insufficient documentation

## 2021-10-28 DIAGNOSIS — K219 Gastro-esophageal reflux disease without esophagitis: Secondary | ICD-10-CM

## 2021-10-28 DIAGNOSIS — R0902 Hypoxemia: Secondary | ICD-10-CM | POA: Diagnosis not present

## 2021-10-28 DIAGNOSIS — R059 Cough, unspecified: Secondary | ICD-10-CM | POA: Diagnosis not present

## 2021-10-28 LAB — D-DIMER, QUANTITATIVE: D-Dimer, Quant: 1.61 mcg/mL FEU — ABNORMAL HIGH (ref <0.50)

## 2021-10-28 LAB — POCT EXHALED NITRIC OXIDE: FeNO level (ppb): 11

## 2021-10-28 MED ORDER — PANTOPRAZOLE SODIUM 40 MG PO TBEC: 40.0000 mg | DELAYED_RELEASE_TABLET | Freq: Every day | ORAL | 5 refills | Status: DC

## 2021-10-28 MED ORDER — AZELASTINE HCL 0.1 % NA SOLN: 2.0000 | Freq: Two times a day (BID) | NASAL | 12 refills | Status: DC

## 2021-10-28 MED ORDER — IOHEXOL 350 MG/ML SOLN
100.0000 mL | Freq: Once | INTRAVENOUS | Status: AC | PRN
  Administered 2021-10-28: 100 mL via INTRAVENOUS

## 2021-10-28 MED ORDER — BENZONATATE 200 MG PO CAPS: 200.0000 mg | ORAL_CAPSULE | Freq: Three times a day (TID) | ORAL | 1 refills | Status: DC | PRN

## 2021-10-28 MED ORDER — MONTELUKAST SODIUM 10 MG PO TABS: 10.0000 mg | ORAL_TABLET | Freq: Every day | ORAL | 5 refills | Status: DC

## 2021-10-28 NOTE — Assessment & Plan Note (Signed)
Persistent symptoms. Suspect causing upper airway irritation. Target postnasal drainage and cough control.  ?

## 2021-10-28 NOTE — Patient Instructions (Addendum)
Continue Bevespi 2 puffs Twice daily  ?Continue Albuterol inhaler 2 puffs every 6 hours as needed for shortness of breath or wheezing. Notify if symptoms persist despite rescue inhaler/neb use. ?Continue allegra 180 mg daily ?Continue flonase 2 sprays each nostril daily ?Continue protonix 20 mg daily  ? ?Singulair 10 mg At bedtime  ?Mucinex DM 600 mg Twice daily for chest congestion/cough ?Tessalon perles 1 capsule Three times a day for cough ?Astelin 2 sprays each nostril Twice daily for nasal congestion/drainage  ?Saline nasal irrigation 1-2 times a day  ? ?Avoid throat clearing. Frequent sips of water. Suck on sugar free hard candies throughout the day  ? ?Start supplemental oxygen 2 lpm POC with activity and 2 lpm continuous at night. Goal oxygen level >88-90% ?Chest x ray today. We will notify you of any abnormal results  ? ?Labs - d dimer  ? ?Follow up in 2 weeks with Dr. Lamonte Sakai or Alanson Aly. If symptoms do not improve or worsen, please contact office for sooner follow up or seek emergency care. ?

## 2021-10-28 NOTE — Assessment & Plan Note (Signed)
Upon review of chart, does normally run in the low 90s on room air.  Unable to find where she has been walk previously.  Walking oximetry today with desaturations to 83% on room air.  No acute symptoms present today.  Suspect this is likely chronic; however, will rule out acute etiologies such as pneumonia, pleural effusion, PE.  CXR ordered today.  Awaiting D-dimer results. ?

## 2021-10-28 NOTE — Addendum Note (Signed)
Addended by: Clayton Bibles on: 10/28/2021 03:41 PM ? ? Modules accepted: Orders ? ?

## 2021-10-28 NOTE — Assessment & Plan Note (Signed)
Progressive worsening since November. I am concerned for other underlying etiology given new oxygen requirement, recent unintentional weight loss, history of breast cancer taking tamoxifen, significant tobacco use and progressive symptoms.  Will obtain D-dimer today given increased risk for clots.  Depending on this, will obtain either CTA chest with PE protocol or CT chest with contrast to further evaluate underlying pulmonary etiology. ? ?Patient Instructions  ?Continue Bevespi 2 puffs Twice daily  ?Continue Albuterol inhaler 2 puffs every 6 hours as needed for shortness of breath or wheezing. Notify if symptoms persist despite rescue inhaler/neb use. ?Continue allegra 180 mg daily ?Continue flonase 2 sprays each nostril daily ?Continue protonix 20 mg daily  ? ?Singulair 10 mg At bedtime  ?Mucinex DM 600 mg Twice daily for chest congestion/cough ?Tessalon perles 1 capsule Three times a day for cough ?Astelin 2 sprays each nostril Twice daily for nasal congestion/drainage  ?Saline nasal irrigation 1-2 times a day  ? ?Avoid throat clearing. Frequent sips of water. Suck on sugar free hard candies throughout the day  ? ?Start supplemental oxygen 2 lpm POC with activity and 2 lpm continuous at night. Goal oxygen level >88-90% ?Chest x ray today. We will notify you of any abnormal results  ? ?Labs - d dimer  ? ?Follow up in 2 weeks with Dr. Lamonte Sakai or Alanson Aly. If symptoms do not improve or worsen, please contact office for sooner follow up or seek emergency care. ? ? ?

## 2021-10-28 NOTE — Assessment & Plan Note (Addendum)
Continues to experience high symptom burden. Did not notice significant difference with Bevespi from Trelegy.  Do suspect that there is an upper airway component to her cough as well given her allergy type symptoms and postnasal drainage. FeNO was nl today so would hold off on ICS addition at this point. She does have an elevated eosinophil count (300) on labs from 09/2021 so we will add singulair. If workup unremarkable, may need to repeat PFTs. ?

## 2021-10-28 NOTE — Assessment & Plan Note (Signed)
Breakthrough symptoms; step up Protonix to 40 mg daily.  ?

## 2021-10-28 NOTE — Progress Notes (Signed)
Patient notified of elevated D-dimer.  Stat CTA with PE protocol ordered for further evaluation.

## 2021-10-28 NOTE — Progress Notes (Signed)
? ?@Patient  ID: Sheila Shields, female    DOB: [birth year], 87 y.o.   MRN: [id number] ? ?Chief Complaint  ?Patient presents with  ? Follow-up  ?  Sporadic cough  ? ? ?Referring provider: ?Marin Olp, MD ? ?HPI: ?87 year old female, former smoker (60 pack years) followed for COPD with chronic bronchitis, allergic rhinitis, upper airway cough.  She is a patient of Dr. Agustina Caroli and last seen in office 10/07/2021.  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 ? ?TEST/EVENTS:  ?10/09/2013 PFTs: FVC 93, FEV1 87, ratio 70, TLC 79, DLCOunc 48%.  Mild obstructive airway disease with diffusion defect.  No significant bronchodilator response. ?08/31/2019 echocardiogram: EF 60 to 65%.  G1 DD is present.  RV size and function is normal.  Unable to measure PASP.  There is mild AAS.  Trivial MR. ? ?10/07/2021: OV with Dr. Lamonte Sakai.  Treated several times since November for AECOPD.  Reported today that she felt better but still has exertional shortness of breath.  Also has noted some desaturations at home.  Does continue to have a persistent cough.  Tried Trelegy for 2 months and felt that she benefited from it.  Felt as though symptoms were more subacute and concerned that she had some upper airway lability.  Unclear if she actually needs ICS; not a person who flares frequently before the last few months.  Decided to try her on Stiolto; ultimately had to start on Bevespi instead due to insurance coverage options.  Continued Allegra and Flonase for allergic rhinitis.  Continued on Protonix 20 mg daily for GERD. ? ?10/28/2021: Today-follow-up ?Patient presents today for scheduled follow-up.  Since we saw her last, she reports feeling relatively the same.  Cough is persistent, occasionally with yellow to green sputum.  She has had progressive DOE since November.  Feels like it may just be  related to her getting older.  Has not been routinely monitoring her oxygen levels at home but does note that she normally is around the low 90s.  Does report some persistent allergy type symptoms with nasal congestion and itchy, watery eyes.  Upon further investigation, found that patient has lost around 25 pounds since September unintentionally.  Does report that she is not snacking as frequently and at their retirement community, she no longer has to cook.  Also feels like her appetite has not been as hearty as it used to be.  She denies any hemoptysis, recent calf pain or tenderness, lower extremity swelling, night sweats.  Unsure if she notices much of a difference with the Bevespi inhaler.  Continues on Allegra and Flonase daily.  Does occasionally get a burning sensation in her chest and suspects this may be some reflux.  She is currently on Protonix 20 mg daily.  She is currently on tamoxifen for breast cancer and has been under surveillance with oncology. ? ?Allergies  ?Allergen Reactions  ? Alcohol Other (See Comments)  ?  Recovering Alcoholic*  ? Cheese Nausea And Vomiting  ? Codeine   ?  Patient prefers not to take this due to history of alcoholism  ? Dilaudid [Hydromorphone Hcl]   ?  Pt had hypotension after dilaudid 1mg  IV while in the OR, required temporary pressor intervention.  ? ? ?Immunization History  ?Administered Date(s) Administered  ? Fluad Quad(high Dose 65+) 04/04/2019, 03/22/2021  ? Influenza Split 04/09/2012  ? Influenza Whole 05/01/2007, 04/06/2008,  04/21/2009, 03/24/2010, 04/10/2011  ? Influenza, High Dose Seasonal PF 04/09/2015, 03/17/2016, 05/22/2016, 04/16/2017, 05/21/2017, 03/20/2018, 05/20/2018, 05/26/2019, 04/10/2020, 05/26/2020, 05/26/2021  ? Influenza,inj,Quad PF,6+ Mos 03/03/2013  ? Influenza,inj,quad, With Preservative 03/20/2018  ? Influenza-Unspecified 04/09/2014, 04/09/2015, 04/09/2017, 03/20/2018  ? PFIZER(Purple Top)SARS-COV-2 Vaccination 07/27/2019, 08/14/2019,  04/30/2020, 05/26/2020, 12/07/2020  ? Pension scheme manager 6yrs & up 06/15/2021  ? Pneumococcal Conjugate-13 05/05/2015  ? Pneumococcal Polysaccharide-23 04/09/2004, 05/21/2017, 05/20/2018, 05/26/2019, 05/26/2020, 05/26/2021  ? Td 12/08/2005  ? Tdap 12/22/2014  ? Zoster Recombinat (Shingrix) 08/01/2017, 10/03/2017  ? Zoster, Live 01/06/2008  ? ? ?Past Medical History:  ?Diagnosis Date  ? Alcoholism in remission ([place]) 08/27/2007  ? Anemia   ? Arthritis   ? OA  ? Breast cancer ([place])   ? left breast  ? COPD 12/17/2006  ? Diverticulosis 03/27/2007  ? GERD 05/01/2007  ? HYPERGLYCEMIA 08/27/2007  ? HYPERLIPIDEMIA 12/17/2006  ? HYPERTENSION 12/17/2006  ? Mood disorder ([place])   ? hx anxiety and depression. meds short term during life transition  ? Pneumonia   ? AS A CHILD  ? Rectal polyp 03/27/2007  ? adenoma  ? SOB (shortness of breath) on exertion   ? per patient due to having COPD  ? TIA (transient ischemic attack)   ? ? ?Tobacco History: ?Social History  ? ?Tobacco Use  ?Smoking Status Former  ? Packs/day: 2.00  ? Years: 30.00  ? Pack years: 60.00  ? Types: Cigarettes  ? Quit date: 07/10/1984  ? Years since quitting: 37.3  ?Smokeless Tobacco Never  ? ?Counseling given: Not Answered ? ? ?Outpatient Medications Prior to Visit  ?Medication Sig Dispense Refill  ? acetaminophen (TYLENOL) 650 MG CR tablet Take 1,300 mg by mouth 2 (two) times daily.    ? albuterol (PROAIR HFA) 108 (90 Base) MCG/ACT inhaler INHALE 2 PUFFS EVERY 4 HOURS AS NEEDED FOR COUGHING SPELLS 18 g 5  ? aspirin 325 MG tablet Take 1 tablet (325 mg total) by mouth daily. 30 tablet 3  ? Cholecalciferol (VITAMIN D) 50 MCG (2000 UT) tablet Take 2,000 Units by mouth daily.    ? clopidogrel (PLAVIX) 75 MG tablet Take 0.5 tablets (37.5 mg total) by mouth daily. 46 tablet 3  ? Coenzyme Q10 300 MG CAPS Take 300 mg by mouth daily.    ? fexofenadine (ALLEGRA) 180 MG tablet Take 180 mg by mouth daily.    ? fluticasone (FLONASE) 50 MCG/ACT nasal spray Place 2  sprays into both nostrils daily. 16 g 2  ? Glycopyrrolate-Formoterol (BEVESPI AEROSPHERE) 9-4.8 MCG/ACT AERO Inhale 2 puffs into the lungs 2 (two) times daily. 10.7 g 5  ? hydrochlorothiazide (HYDRODIURIL) 25 MG tablet Take 0.5 tablets (12.5 mg total) by mouth daily. 46 tablet 3  ? Multiple Vitamin (MULTIVITAMIN WITH MINERALS) TABS tablet Take 1 tablet by mouth daily.    ? potassium chloride SA (KLOR-CON M20) 20 MEQ tablet Take 1 tablet (20 mEq total) by mouth every other day. 46 tablet 3  ? Propylene Glycol (SYSTANE BALANCE OP) Place 1 drop into both eyes daily.    ? rosuvastatin (CRESTOR) 20 MG tablet Take 1 tablet (20 mg total) by mouth daily. 90 tablet 3  ? tamoxifen (NOLVADEX) 20 MG tablet Take 1 tablet (20 mg total) by mouth daily. 90 tablet 3  ? traMADol (ULTRAM) 50 MG tablet TAKE ONE TABLET BY MOUTH EVERY 8 HOURS AS NEEDED FOR PAIN FOR UP TO 5 DAYS (DO NOT DRIVE FOR 8 HOURS AFTER TAKING) 45 tablet 5  ?  traZODone (DESYREL) 50 MG tablet TAKE 1 TABLET BY MOUTH EVERY NIGHT AT BEDTIME AS NEEDED FOR SLEEP (Patient taking differently: Take 50 mg by mouth at bedtime.) 90 tablet 3  ? Turmeric (QC TUMERIC COMPLEX PO) Take 1,500 mg by mouth daily.    ? vitamin B-12 (CYANOCOBALAMIN) 1000 MCG tablet Take 1,000 mcg by mouth daily.    ? pantoprazole (PROTONIX) 20 MG tablet Take 1 tablet (20 mg total) by mouth daily. 90 tablet 3  ? ?No facility-administered medications prior to visit.  ? ? ? ?Review of Systems:  ? ?Constitutional: No night sweats, fevers, chills +fatigue, weight loss (25 pounds) ?HEENT: No headaches, difficulty swallowing, tooth/dental problems, or sore throat. No sneezing, ear ache. +nasal congestion; itchy, watery eyes ?CV:  No chest pain, orthopnea, PND, swelling in lower extremities, anasarca, dizziness, palpitations, syncope ?Resp: +shortness of breath with exertion (progressive decline); productive cough (unchanged).  No hemoptysis. No wheezing.  No chest wall deformity ?GI:  + GERD symptoms.  No  abdominal pain, nausea, vomiting, diarrhea, change in bowel habits, loss of appetite, bloody stools.  ?GU: No dysuria, change in color of urine, urgency or frequency.  No flank pain, no hematuria  ?Skin: No rash, lesions,

## 2021-10-28 NOTE — Progress Notes (Signed)
Please notify pt no acute process noted on CXR. We will see what CTA chest looks like. Thanks.

## 2021-10-31 ENCOUNTER — Telehealth: Payer: Self-pay | Admitting: Nurse Practitioner

## 2021-10-31 DIAGNOSIS — R918 Other nonspecific abnormal finding of lung field: Secondary | ICD-10-CM

## 2021-10-31 DIAGNOSIS — Z170 Estrogen receptor positive status [ER+]: Secondary | ICD-10-CM

## 2021-10-31 DIAGNOSIS — J441 Chronic obstructive pulmonary disease with (acute) exacerbation: Secondary | ICD-10-CM | POA: Diagnosis not present

## 2021-10-31 DIAGNOSIS — J9611 Chronic respiratory failure with hypoxia: Secondary | ICD-10-CM | POA: Diagnosis not present

## 2021-10-31 NOTE — Telephone Encounter (Signed)
Spoke with pt regarding CTA findings - PET scan ordered. Documented in separate encounter. Thanks.

## 2021-10-31 NOTE — Telephone Encounter (Signed)
Spoke with patient regarding CTA chest scan findings. There is a 2.5x2.3 cm LLL mass, concerning for malignancy. Soft tissue density in the left breast as well. PET scan ordered. Pt verbalized understanding. All questions answered. We will follow up after the PET scan and discuss next steps.  ?

## 2021-10-31 NOTE — Telephone Encounter (Signed)
Call report on CTA from 10/28/21  ? ?Impression: ? ?IMPRESSION: ?1. No evidence of pulmonary embolism. ?2. Marked severity emphysematous lung disease. ?3. 2.5 cm x 2.3 cm heterogeneous left lower lobe lung mass, ?consistent with an underlying neoplasm. Soft tissue sampling and a ?nuclear medicine PET-CT scan are recommended. This recommendation ?follows the consensus statement: Guidelines for Management of ?Incidental Pulmonary Nodules Detected on CT Images: From the ?Fleischner Society 2017; Radiology 2017; [phone]. ?4. Multiple bilateral subcentimeter lung nodules system with ?pulmonary metastasis. ?5. 3.5 cm x 2.5 cm x 3.4 cm well-defined soft tissue mass within the ?left breast with adjacent surgical clips. Correlation with ?mammography is recommended. ?6. Small left pleural effusion. ?7. Low-attenuation liver lesions which may represent multiple ?hepatic cysts. Correlation with nonemergent hepatic ultrasound is ?recommended. ?  ?Aortic Atherosclerosis (ICD10-I70.0) and Emphysema (ICD10-J43.9). ?  ?  ?Electronically Signed ?  By: Virgina Norfolk M.D. ?  On: 10/28/2021 18:04 ?  ?Forwarding to [employer] ?

## 2021-11-09 ENCOUNTER — Telehealth: Payer: Self-pay | Admitting: Nurse Practitioner

## 2021-11-09 ENCOUNTER — Ambulatory Visit (HOSPITAL_COMMUNITY)
Admission: RE | Admit: 2021-11-09 | Discharge: 2021-11-09 | Disposition: A | Discharge status: Home / Self Care | Payer: Medicare HMO | Source: Ambulatory Visit | Attending: Nurse Practitioner | Admitting: Nurse Practitioner

## 2021-11-09 DIAGNOSIS — E041 Nontoxic single thyroid nodule: Secondary | ICD-10-CM | POA: Diagnosis not present

## 2021-11-09 DIAGNOSIS — J90 Pleural effusion, not elsewhere classified: Secondary | ICD-10-CM | POA: Diagnosis not present

## 2021-11-09 DIAGNOSIS — R918 Other nonspecific abnormal finding of lung field: Secondary | ICD-10-CM | POA: Diagnosis not present

## 2021-11-09 DIAGNOSIS — Z853 Personal history of malignant neoplasm of breast: Secondary | ICD-10-CM | POA: Diagnosis not present

## 2021-11-09 DIAGNOSIS — C50412 Malignant neoplasm of upper-outer quadrant of left female breast: Secondary | ICD-10-CM | POA: Insufficient documentation

## 2021-11-09 DIAGNOSIS — Z170 Estrogen receptor positive status [ER+]: Secondary | ICD-10-CM | POA: Diagnosis not present

## 2021-11-09 DIAGNOSIS — C7951 Secondary malignant neoplasm of bone: Secondary | ICD-10-CM | POA: Diagnosis not present

## 2021-11-09 LAB — GLUCOSE, CAPILLARY: Glucose-Capillary: 109 mg/dL — ABNORMAL HIGH (ref 70–99)

## 2021-11-09 MED ORDER — FLUDEOXYGLUCOSE F - 18 (FDG) INJECTION
8.1100 | Freq: Once | INTRAVENOUS | Status: AC | PRN
  Administered 2021-11-09: 8.11 via INTRAVENOUS

## 2021-11-09 NOTE — Telephone Encounter (Signed)
STAT order has been placed.  ?

## 2021-11-09 NOTE — Telephone Encounter (Signed)
Reviewed PET scan imaging and CT chest with Dr. Lamonte Sakai. PET scan highly with intense hypermetabolism of a 2.3 cm microlobulated LLL nodule, SUV max 18.6, 7 mm hypermetabolic nodule in the lateral LUL, and other tiny less than 5 mm nodules bilaterally which also had some mild FDG uptake. Findings highly suspicious for pulmonary metastases. There were also hypermetabolic left hilar and mediastinal lymphadenopathy, left axillary node, several hypermetabolic bone mets in spine and pelvis, and a 2 cm right thyroid lobe nodule. Discussed results with pt who verbalized understanding. Recommend a fine needle biopsy of the 2.3 cm mass in IR. Reviewed potential risks. Pt agreeable and would like to move forward with this. STAT order placed today. Advised P[facility]s would be in contact for scheduling.  ?

## 2021-11-10 ENCOUNTER — Encounter: Payer: Self-pay | Admitting: *Deleted

## 2021-11-10 NOTE — Progress Notes (Unsigned)
[name], MD  [facility] Maser ?Ok  ? ?CT core LLL nodule  ?Met v primary, h/o breast Ca  ? ?DDH  ?

## 2021-11-11 ENCOUNTER — Encounter: Payer: Self-pay | Admitting: *Deleted

## 2021-11-11 ENCOUNTER — Ambulatory Visit: Payer: Medicare HMO | Admitting: Nurse Practitioner

## 2021-11-11 NOTE — Progress Notes (Unsigned)
Sheila Olp, MD  Leilani Able, Karie Schwalbe, NP ?May hold plavix for 5 days prior to procedure but continue aspirin per Dr. Estanislado Pandy. Restart Plavix as soon as allowed after procedure   ?  ?   ? ?

## 2021-11-15 ENCOUNTER — Encounter: Payer: Self-pay | Admitting: Emergency Medicine

## 2021-11-15 ENCOUNTER — Ambulatory Visit: Payer: Medicare HMO | Admitting: Emergency Medicine

## 2021-11-15 DIAGNOSIS — J410 Simple chronic bronchitis: Secondary | ICD-10-CM | POA: Diagnosis not present

## 2021-11-15 DIAGNOSIS — R918 Other nonspecific abnormal finding of lung field: Secondary | ICD-10-CM | POA: Diagnosis not present

## 2021-11-15 NOTE — Assessment & Plan Note (Signed)
Please continue Breztri 2 puffs twice a day. ?Keep albuterol available to use 2 puffs if needed for shortness of breath, chest tightness, wheezing. ?

## 2021-11-15 NOTE — Patient Instructions (Signed)
Please continue Breztri 2 puffs twice a day. ?Keep albuterol available to use 2 puffs if needed for shortness of breath, chest tightness, wheezing. ?We reviewed your CT scan of the chest and your PET scan today.  Agree with plans to have a transthoracic needle biopsy of your left lower lobe pulmonary nodule.  We will call you to discuss results and then plan referral to oncology. ?Follow with Sheila Shields in 1 month or next available ?

## 2021-11-15 NOTE — Progress Notes (Signed)
? ?Subjective:  ? ? Patient ID: Sheila Shields, female    DOB: 01/14/1935, 87 y.o.   MRN: [id number] ? ?COPD ?She complains of cough and shortness of breath. There is no wheezing. Pertinent negatives include no ear pain, fever, headaches, myalgias, postnasal drip, rhinorrhea, sneezing, sore throat or trouble swallowing. Her past medical history is significant for COPD.  ? ?ROV 10/07/21 --pleasant 87 year old woman whom I have followed for mild COPD with documented obstruction.  Also with upper airway irritability and chronic cough in the setting of chronic rhinitis and GERD.  It has been several years since have seen her.  She reports ?She is on Allegra, fluticasone nasal spray, Protonix 20 mg daily.  She has albuterol available to use as needed, uses 2 x a day.  ?She reports that she has had multiple flares since November. She moved into a new home around that time. She developed SOB with normal activities. Intermittent wheeze, cough, can be worse at night. She was treated with abx and pred then and on 2 more occasions in December and January. She is improved, but still has exertional SOB. She has seen desaturations. She tried Trelegy x 2 months, felt that she benefited.  Chest x-ray was done, report is normal ? ? ?ROV 11/15/2021 --[name] is 73 with a history of mild COPD, left breast cancer treated with lumpectomy, XRT and then tamoxifen, hypertension.  She reestablished care with me just over a month ago because she was having more dyspnea, multiple flares of shortness of breath beginning in November as well as wheezing, coughing.  I tried changing her Trelegy first to [employer] and then to [place] for insurance reasons.  She followed up here with persistent dyspnea and CT-PA showed no PE but new rounded left lower lobe nodules, adjacent left lower lobe nodules (smaller) and mediastinal adenopathy.  This prompted PET scan as below.  ?Her breathing is the same. She has stable back pain, no focal weakness. No loss bowel or  bladder.  ? ?PET scan 11/09/2021 reviewed by me shows significant hypermetabolism in the 2.3 cm microlobulated left lower lobe pulmonary nodule as well as the other smaller associated nodules.  There is mild hypermetabolic left hilar and mediastinal adenopathy.  A single normal-sized right axillary node is hypermetabolic (indeterminate) there is a small left pleural effusion without any focal activity.  Also noted were several hypermetabolic bone metastases in the spine and pelvis. ? ? ? ?Past Medical History:  ?Diagnosis Date  ? Alcoholism in remission ([place]) 08/27/2007  ? Anemia   ? Arthritis   ? OA  ? Breast cancer ([place])   ? left breast  ? COPD 12/17/2006  ? Diverticulosis 03/27/2007  ? GERD 05/01/2007  ? HYPERGLYCEMIA 08/27/2007  ? HYPERLIPIDEMIA 12/17/2006  ? HYPERTENSION 12/17/2006  ? Mood disorder ([place])   ? hx anxiety and depression. meds short term during life transition  ? Pneumonia   ? AS A CHILD  ? Rectal polyp 03/27/2007  ? adenoma  ? SOB (shortness of breath) on exertion   ? per patient due to having COPD  ? TIA (transient ischemic attack)   ? ? ? ?Review of Systems  ?Constitutional:  Negative for fever and unexpected weight change.  ?HENT:  Negative for congestion, dental problem, ear pain, nosebleeds, postnasal drip, rhinorrhea, sinus pressure, sneezing, sore throat and trouble swallowing.   ?Eyes:  Negative for redness and itching.  ?Respiratory:  Positive for cough and shortness of breath. Negative for chest tightness and wheezing.   ?  Cardiovascular:  Negative for palpitations and leg swelling.  ?Gastrointestinal:  Negative for nausea and vomiting.  ?Genitourinary:  Negative for dysuria.  ?Musculoskeletal:  Negative for arthralgias, joint swelling and myalgias.  ?Skin:  Negative for rash.  ?Neurological:  Negative for headaches.  ?Hematological:  Does not bruise/bleed easily.  ?Psychiatric/Behavioral:  Negative for dysphoric mood. The patient is not nervous/anxious.   ? ?Past Medical History:  ?Diagnosis Date  ?  Alcoholism in remission ([place]) 08/27/2007  ? Anemia   ? Arthritis   ? OA  ? Breast cancer ([place])   ? left breast  ? COPD 12/17/2006  ? Diverticulosis 03/27/2007  ? GERD 05/01/2007  ? HYPERGLYCEMIA 08/27/2007  ? HYPERLIPIDEMIA 12/17/2006  ? HYPERTENSION 12/17/2006  ? Mood disorder ([place])   ? hx anxiety and depression. meds short term during life transition  ? Pneumonia   ? AS A CHILD  ? Rectal polyp 03/27/2007  ? adenoma  ? SOB (shortness of breath) on exertion   ? per patient due to having COPD  ? TIA (transient ischemic attack)   ?  ? ?Family History  ?Problem Relation Age of Onset  ? Throat cancer Mother   ?     1976  ? Heart attack Father 71  ? Idiopathic pulmonary fibrosis Brother 57  ? Cancer Brother   ?     sarcoma  ? Heart disease Brother   ? Colon cancer Neg Hx   ? Esophageal cancer Neg Hx   ? Stomach cancer Neg Hx   ?  ? ?Social History  ? ?Socioeconomic History  ? Marital status: Married  ?  Spouse name: Not on file  ? Number of children: 2  ? Years of education: Not on file  ? Highest education level: Not on file  ?Occupational History  ? Occupation: retired  ?Tobacco Use  ? Smoking status: Former  ?  Packs/day: 2.00  ?  Years: 30.00  ?  Pack years: 60.00  ?  Types: Cigarettes  ?  Quit date: 07/10/1984  ?  Years since quitting: 37.3  ? Smokeless tobacco: Never  ?Vaping Use  ? Vaping Use: Never used  ?Substance and Sexual Activity  ? Alcohol use: No  ?  Alcohol/week: 0.0 standard drinks  ?  Comment: no alcoho since 1983  ? Drug use: No  ? Sexual activity: Not on file  ?Other Topics Concern  ? Not on file  ?Social History Narrative  ? Home Situation: lives with husband. 2 daughters, 2 step daughters, 6 grandkids. 2 greatgrandkids   ? Work or School: very active in [employer] and church  ?   ? Spiritual Beliefs: Christian  ?   ? Lifestyle:tries to walk; diet ok - denies any alcohol or tobacco use in many many years  ?   ? Caffeine 1 cup coffee/day  ?   ? Hobbies: news junkie, audio books  ?   ? Left Handed  ?   ? Lives in one  story home  ? ?Social Determinants of Health  ? ?Financial Resource Strain: Low Risk   ? Difficulty of Paying Living Expenses: Not hard at all  ?Food Insecurity: No Food Insecurity  ? Worried About Charity fundraiser in the Last Year: Never true  ? Ran Out of Food in the Last Year: Never true  ?Transportation Needs: No Transportation Needs  ? Lack of Transportation (Medical): No  ? Lack of Transportation (Non-Medical): No  ?Physical Activity: Inactive  ? Days of Exercise per  Week: 0 days  ? Minutes of Exercise per Session: 0 min  ?Stress: No Stress Concern Present  ? Feeling of Stress : Not at all  ?Social Connections: Socially Integrated  ? Frequency of Communication with Friends and Family: More than three times a week  ? Frequency of Social Gatherings with Friends and Family: More than three times a week  ? Attends Religious Services: More than 4 times per year  ? Active Member of Clubs or Organizations: Yes  ? Attends Archivist Meetings: 1 to 4 times per year  ? Marital Status: Married  ?Intimate Partner Violence: Not At Risk  ? Fear of Current or Ex-Partner: No  ? Emotionally Abused: No  ? Physically Abused: No  ? Sexually Abused: No  ?  ? ?Allergies  ?Allergen Reactions  ? Alcohol Other (See Comments)  ?  Recovering Alcoholic*  ? Cheese Nausea And Vomiting  ? Codeine   ?  Patient prefers not to take this due to history of alcoholism  ? Dilaudid [Hydromorphone Hcl]   ?  Pt had hypotension after dilaudid 1mg  IV while in the OR, required temporary pressor intervention.  ?  ? ?Outpatient Medications Prior to Visit  ?Medication Sig Dispense Refill  ? acetaminophen (TYLENOL) 650 MG CR tablet Take 1,300 mg by mouth 2 (two) times daily.    ? albuterol (PROAIR HFA) 108 (90 Base) MCG/ACT inhaler INHALE 2 PUFFS EVERY 4 HOURS AS NEEDED FOR COUGHING SPELLS 18 g 5  ? aspirin 325 MG tablet Take 1 tablet (325 mg total) by mouth daily. 30 tablet 3  ? azelastine (ASTELIN) 0.1 % nasal spray Place 2 sprays into both  nostrils 2 (two) times daily. Use in each nostril as directed 30 mL 12  ? benzonatate (TESSALON) 200 MG capsule Take 1 capsule (200 mg total) by mouth 3 (three) times daily as needed for cough. 30 capsule

## 2021-11-15 NOTE — Assessment & Plan Note (Signed)
Reviewed her CT scan and PET scan with her today.  She has rounded pulmonary nodules, evidence of nodal disease and metastatic disease to spine and sacrum.  I think this is most consistent with metastatic breast cancer but certainly primary lung cancer is a possibility.  She is set for a needle biopsy of the peripheral left lower lobe nodule this week.  I will follow-up the results with her.  If this is breast cancer I will get her to Dr. Burr Medico as soon as possible.  She will likely also need radiation therapy ASAP.  If this is lung cancer then will consult with thoracic oncology ?

## 2021-11-16 ENCOUNTER — Other Ambulatory Visit: Payer: Self-pay | Admitting: Student

## 2021-11-16 ENCOUNTER — Other Ambulatory Visit: Payer: Self-pay | Admitting: Radiology

## 2021-11-16 NOTE — H&P (Signed)
? ?Chief Complaint: ?Patient was seen in consultation today for bone lesion biopsy.  ? ?Referring Physician(s): ?Cobb,Katherine V ? ?Supervising Physician: Sandi Mariscal ? ?Patient Status: [facility] - Out-pt ? ?History of Present Illness: ?Sheila Shields is an 87 y.o. female with a medical history significant for left breast cancer s/p lumpectomy, COPD, former smoker, TIA and intracranial aneurysm s/p treatment in [facility] 01/01/19. She presented to her pulmonologist for evaluation of a persistent cough and worsening dyspnea on exertion. Imaging showed findings concerning for metastatic cancer - lung versus breast.   ? ?NM PET 11/09/21 ?IMPRESSION: ?1. Hypermetabolic 2.3 cm left lower lobe pulmonary nodule, with other ?sub-cm bilateral pulmonary nodules also showing FDG uptake highly ?suspicious for pulmonary metastases. ?2. Mild hypermetabolic left hilar and mediastinal lymphadenopathy, ?consistent with metastatic disease. ?3. Single normal size right axillary lymph node is hypermetabolic, ?which is indeterminate. ?4. Stable small left pleural effusion, without focal hypermetabolic ?activity. ?5. Several hypermetabolic bone metastases in the spine and pelvis. ?6. 2 cm hypermetabolic right thyroid lobe nodule. Malignancy cannot be ?excluded. ? ?Interventional Radiology has been asked to evaluate this patient for an image-guided lung nodule biopsy. Imaging reviewed and procedure approved by Dr. Vernard Gambles. ? ?Past Medical History:  ?Diagnosis Date  ? Alcoholism in remission ([place]) 08/27/2007  ? Anemia   ? Arthritis   ? OA  ? Breast cancer ([place])   ? left breast  ? COPD 12/17/2006  ? Diverticulosis 03/27/2007  ? GERD 05/01/2007  ? HYPERGLYCEMIA 08/27/2007  ? HYPERLIPIDEMIA 12/17/2006  ? HYPERTENSION 12/17/2006  ? Mood disorder ([place])   ? hx anxiety and depression. meds short term during life transition  ? Pneumonia   ? AS A CHILD  ? Rectal polyp 03/27/2007  ? adenoma  ? SOB (shortness of breath) on exertion   ? per patient due to having COPD  ?  TIA (transient ischemic attack)   ? ? ?Past Surgical History:  ?Procedure Laterality Date  ? ABDOMINAL HYSTERECTOMY  1978  ? fibroma  ? APPENDECTOMY  2002  ? BREAST EXCISIONAL BIOPSY Right 2003  ? negative  ? BREAST LUMPECTOMY WITH RADIOACTIVE SEED LOCALIZATION Left 03/23/2020  ? Procedure: LEFT BREAST LUMPECTOMY WITH RADIOACTIVE SEED LOCALIZATION;  Surgeon: [name], MD;  Location: [facility];  Service: General;  Laterality: Left;  RNFA  ? CARPAL TUNNEL RELEASE Left 2010 or 2011  ? CATARACT EXTRACTION W/ INTRAOCULAR LENS  IMPLANT, BILATERAL Bilateral   ? COLONOSCOPY W/ POLYPECTOMY  03/27/2007; n7/19/2012  ? 2008: 4 mm adenoma, diverticulosis 2012: ileitis ? NSAID - likely, 2-3 mm cecal polyp LYMPHOID FOLLICLE, diverticulosis  ? ESOPHAGOGASTRODUODENOSCOPY  01/26/2011  ? reflux esophagitis, colonscopy done also  ? HAMMER TOE SURGERY  2008  ? left foot  ? HERNIA REPAIR Right 1996?  ? IR ANGIO INTRA EXTRACRAN SEL COM CAROTID INNOMINATE BILAT MOD SED  10/25/2021  ? IR ANGIO INTRA EXTRACRAN SEL COM CAROTID INNOMINATE UNI R MOD SED  11/28/2018  ? IR ANGIO INTRA EXTRACRAN SEL COM CAROTID INNOMINATE UNI R MOD SED  03/17/2021  ? IR ANGIO INTRA EXTRACRAN SEL INTERNAL CAROTID UNI R MOD SED  12/30/2018  ? IR ANGIO VERTEBRAL SEL SUBCLAVIAN INNOMINATE UNI R MOD SED  11/28/2018  ? IR ANGIO VERTEBRAL SEL SUBCLAVIAN INNOMINATE UNI R MOD SED  10/26/2021  ? IR ANGIO VERTEBRAL SEL VERTEBRAL UNI R MOD SED  03/18/2021  ? IR ANGIOGRAM FOLLOW UP STUDY  12/30/2018  ? IR TRANSCATH/EMBOLIZ  12/30/2018  ? IR US GUIDE VASC ACCESS RIGHT  11/28/2018  ? IR US GUIDE VASC ACCESS RIGHT  03/17/2021  ? ORIF ANKLE FRACTURE Right 12/22/2014  ? Procedure: OPEN REDUCTION INTERNAL FIXATION (ORIF) ANKLE FRACTURE;  Surgeon: Susa Day, MD;  Location: [facility];  Service: Orthopedics;  Laterality: Right;  ? RADIOLOGY WITH ANESTHESIA N/A 12/30/2018  ? Procedure: RADIOLOGY WITH ANESTHESIA;  Surgeon: Luanne Bras, MD;  Location: [facility];  Service: Radiology;  Laterality: N/A;   ? SHOULDER OPEN ROTATOR CUFF REPAIR Left 03/06/2013  ? Procedure: LEFT ROTATOR CUFF REPAIR, SUBACROMIAL DECOMPRESSION, PATCH GRAFT, MANIPULATION UNDER ANESTHESIA;  Surgeon: Johnn Hai, MD;  Location: [facility];  Service: Orthopedics;  Laterality: Left;  ? TONSILLECTOMY    ? ? ?Allergies: ?Alcohol, Cheese, Codeine, and Dilaudid [hydromorphone hcl] ? ?Medications: ?Prior to Admission medications   ?Medication Sig Start Date End Date Taking? Authorizing Provider  ?acetaminophen (TYLENOL) 650 MG CR tablet Take 1,300 mg by mouth 2 (two) times daily.    [provider]  ?albuterol (PROAIR HFA) 108 (90 Base) MCG/ACT inhaler INHALE 2 PUFFS EVERY 4 HOURS AS NEEDED FOR COUGHING SPELLS 05/24/20   Marin Olp, MD  ?aspirin 325 MG tablet Take 1 tablet (325 mg total) by mouth daily. 01/01/19   Louk, Bea Graff, PA-C  ?azelastine (ASTELIN) 0.1 % nasal spray Place 2 sprays into both nostrils 2 (two) times daily. Use in each nostril as directed 10/28/21   Cobb, Karie Schwalbe, NP  ?benzonatate (TESSALON) 200 MG capsule Take 1 capsule (200 mg total) by mouth 3 (three) times daily as needed for cough. 10/28/21   Clayton Bibles, NP  ?Cholecalciferol (VITAMIN D) 50 MCG (2000 UT) tablet Take 2,000 Units by mouth daily.    [provider]  ?clopidogrel (PLAVIX) 75 MG tablet Take 0.5 tablets (37.5 mg total) by mouth daily. 08/03/21   Marin Olp, MD  ?Coenzyme Q10 300 MG CAPS Take 300 mg by mouth daily.    [provider]  ?fexofenadine (ALLEGRA) 180 MG tablet Take 180 mg by mouth daily.    [provider]  ?fluticasone (FLONASE) 50 MCG/ACT nasal spray Place 2 sprays into both nostrils daily. 09/24/18   Collene Gobble, MD  ?Glycopyrrolate-Formoterol (BEVESPI AEROSPHERE) 9-4.8 MCG/ACT AERO Inhale 2 puffs into the lungs 2 (two) times daily. 10/12/21   Collene Gobble, MD  ?hydrochlorothiazide (HYDRODIURIL) 25 MG tablet Take 0.5 tablets (12.5 mg total) by mouth daily. 08/03/21   Marin Olp,  MD  ?montelukast (SINGULAIR) 10 MG tablet Take 1 tablet (10 mg total) by mouth at bedtime. 10/28/21   Clayton Bibles, NP  ?Multiple Vitamin (MULTIVITAMIN WITH MINERALS) TABS tablet Take 1 tablet by mouth daily.    [provider]  ?pantoprazole (PROTONIX) 40 MG tablet Take 1 tablet (40 mg total) by mouth daily. 10/28/21   Cobb, Karie Schwalbe, NP  ?potassium chloride SA (KLOR-CON M20) 20 MEQ tablet Take 1 tablet (20 mEq total) by mouth every other day. 11/23/20   Marin Olp, MD  ?Propylene Glycol (SYSTANE BALANCE OP) Place 1 drop into both eyes daily.    [provider]  ?rosuvastatin (CRESTOR) 20 MG tablet Take 1 tablet (20 mg total) by mouth daily. 08/03/21   Marin Olp, MD  ?tamoxifen (NOLVADEX) 20 MG tablet Take 1 tablet (20 mg total) by mouth daily. 09/12/21   Alla Feeling, NP  ?traMADol (ULTRAM) 50 MG tablet TAKE ONE TABLET BY MOUTH EVERY 8 HOURS AS NEEDED FOR PAIN FOR UP TO 5 DAYS (DO NOT  DRIVE FOR 8 HOURS AFTER TAKING) 08/15/21   Marin Olp, MD  ?traZODone (DESYREL) 50 MG tablet TAKE 1 TABLET BY MOUTH EVERY NIGHT AT BEDTIME AS NEEDED FOR SLEEP ?Patient taking differently: Take 50 mg by mouth at bedtime. 08/15/21   Marin Olp, MD  ?Turmeric (QC TUMERIC COMPLEX PO) Take 1,500 mg by mouth daily.    [provider]  ?vitamin B-12 (CYANOCOBALAMIN) 1000 MCG tablet Take 1,000 mcg by mouth daily.    [provider]  ?  ? ?Family History  ?Problem Relation Age of Onset  ? Throat cancer Mother   ?     1976  ? Heart attack Father 16  ? Idiopathic pulmonary fibrosis Brother 73  ? Cancer Brother   ?     sarcoma  ? Heart disease Brother   ? Colon cancer Neg Hx   ? Esophageal cancer Neg Hx   ? Stomach cancer Neg Hx   ? ? ?Social History  ? ?Socioeconomic History  ? Marital status: Married  ?  Spouse name: Not on file  ? Number of children: 2  ? Years of education: Not on file  ? Highest education level: Not on file  ?Occupational History  ? Occupation: retired   ?Tobacco Use  ? Smoking status: Former  ?  Packs/day: 2.00  ?  Years: 30.00  ?  Pack years: 60.00  ?  Types: Cigarettes  ?  Quit date: 07/10/1984  ?  Years since quitting: 37.3  ? Smokeless tobacco: Never  ?Vap

## 2021-11-17 ENCOUNTER — Other Ambulatory Visit: Payer: Self-pay

## 2021-11-17 ENCOUNTER — Ambulatory Visit (HOSPITAL_COMMUNITY)
Admission: RE | Admit: 2021-11-17 | Discharge: 2021-11-17 | Disposition: A | Discharge status: Home / Self Care | Payer: Medicare HMO | Source: Ambulatory Visit | Attending: Nurse Practitioner | Admitting: Nurse Practitioner

## 2021-11-17 ENCOUNTER — Other Ambulatory Visit: Payer: Self-pay | Admitting: Nurse Practitioner

## 2021-11-17 DIAGNOSIS — J90 Pleural effusion, not elsewhere classified: Secondary | ICD-10-CM | POA: Diagnosis not present

## 2021-11-17 DIAGNOSIS — M899 Disorder of bone, unspecified: Secondary | ICD-10-CM | POA: Diagnosis not present

## 2021-11-17 DIAGNOSIS — C50411 Malignant neoplasm of upper-outer quadrant of right female breast: Secondary | ICD-10-CM | POA: Diagnosis not present

## 2021-11-17 DIAGNOSIS — R0600 Dyspnea, unspecified: Secondary | ICD-10-CM | POA: Diagnosis not present

## 2021-11-17 DIAGNOSIS — J449 Chronic obstructive pulmonary disease, unspecified: Secondary | ICD-10-CM | POA: Diagnosis not present

## 2021-11-17 DIAGNOSIS — C7951 Secondary malignant neoplasm of bone: Secondary | ICD-10-CM | POA: Diagnosis not present

## 2021-11-17 DIAGNOSIS — Z87891 Personal history of nicotine dependence: Secondary | ICD-10-CM | POA: Diagnosis not present

## 2021-11-17 DIAGNOSIS — R918 Other nonspecific abnormal finding of lung field: Secondary | ICD-10-CM | POA: Diagnosis not present

## 2021-11-17 DIAGNOSIS — Z9012 Acquired absence of left breast and nipple: Secondary | ICD-10-CM | POA: Insufficient documentation

## 2021-11-17 DIAGNOSIS — Z8673 Personal history of transient ischemic attack (TIA), and cerebral infarction without residual deficits: Secondary | ICD-10-CM | POA: Diagnosis not present

## 2021-11-17 DIAGNOSIS — R911 Solitary pulmonary nodule: Secondary | ICD-10-CM | POA: Diagnosis not present

## 2021-11-17 DIAGNOSIS — Z853 Personal history of malignant neoplasm of breast: Secondary | ICD-10-CM | POA: Insufficient documentation

## 2021-11-17 DIAGNOSIS — R053 Chronic cough: Secondary | ICD-10-CM | POA: Diagnosis not present

## 2021-11-17 DIAGNOSIS — M898X8 Other specified disorders of bone, other site: Secondary | ICD-10-CM | POA: Diagnosis not present

## 2021-11-17 DIAGNOSIS — R591 Generalized enlarged lymph nodes: Secondary | ICD-10-CM | POA: Insufficient documentation

## 2021-11-17 LAB — CBC
HCT: 41.3 % (ref 36.0–46.0)
Hemoglobin: 14.1 g/dL (ref 12.0–15.0)
MCH: 32.4 pg (ref 26.0–34.0)
MCHC: 34.1 g/dL (ref 30.0–36.0)
MCV: 94.9 fL (ref 80.0–100.0)
Platelets: 197 10*3/uL (ref 150–400)
RBC: 4.35 MIL/uL (ref 3.87–5.11)
RDW: 13.4 % (ref 11.5–15.5)
WBC: 5.9 10*3/uL (ref 4.0–10.5)
nRBC: 0 % (ref 0.0–0.2)

## 2021-11-17 LAB — PROTIME-INR
INR: 1.1 (ref 0.8–1.2)
Prothrombin Time: 14.1 seconds (ref 11.4–15.2)

## 2021-11-17 MED ORDER — FENTANYL CITRATE (PF) 100 MCG/2ML IJ SOLN
INTRAMUSCULAR | Status: AC
  Filled 2021-11-17: qty 4

## 2021-11-17 MED ORDER — SODIUM CHLORIDE 0.9 % IV SOLN: INTRAVENOUS | Status: DC

## 2021-11-17 MED ORDER — MIDAZOLAM HCL 2 MG/2ML IJ SOLN
INTRAMUSCULAR | Status: AC | PRN
  Administered 2021-11-17 (×2): 1 mg via INTRAVENOUS

## 2021-11-17 MED ORDER — MIDAZOLAM HCL 2 MG/2ML IJ SOLN
INTRAMUSCULAR | Status: AC
  Filled 2021-11-17: qty 4

## 2021-11-17 MED ORDER — FENTANYL CITRATE (PF) 100 MCG/2ML IJ SOLN
INTRAMUSCULAR | Status: AC | PRN
  Administered 2021-11-17 (×2): 25 ug via INTRAVENOUS

## 2021-11-17 NOTE — Procedures (Signed)
Pre procedural Dx: Hypermetabolic right acetabular lesion ?Post procedural Dx: Same ? ?Technically successful CT guided biopsy of hypermetabolic right acetabular lesion ?  ?EBL: None.  ?Complications: None immediate.  ? ?Ronny Bacon, MD ?Pager #: [phone] ? ? ? ?

## 2021-11-23 ENCOUNTER — Encounter: Payer: Self-pay | Admitting: Emergency Medicine

## 2021-11-24 ENCOUNTER — Encounter: Payer: Self-pay | Admitting: Emergency Medicine

## 2021-11-24 ENCOUNTER — Encounter: Payer: Self-pay | Admitting: Family Medicine

## 2021-11-24 NOTE — Telephone Encounter (Signed)
Please let her know that Joellen Jersey and I are watching for the results to report - they still aren't available yet. We will try to contact the path dept to see if there are any preliminary results, or when they think the results will be available.

## 2021-11-25 LAB — SURGICAL PATHOLOGY

## 2021-11-28 ENCOUNTER — Encounter: Payer: Self-pay | Admitting: Emergency Medicine

## 2021-11-28 NOTE — Telephone Encounter (Signed)
Multiple mychart messages sent by pt requesting to know if the biopsy results have come back. Pt requests to be called on her cell first. Dr. Lamonte Sakai, please advise.

## 2021-11-29 ENCOUNTER — Encounter: Payer: Self-pay | Admitting: Family Medicine

## 2021-11-29 ENCOUNTER — Ambulatory Visit (INDEPENDENT_AMBULATORY_CARE_PROVIDER_SITE_OTHER): Payer: Medicare HMO | Admitting: Family Medicine

## 2021-11-29 ENCOUNTER — Encounter: Payer: Self-pay | Admitting: *Deleted

## 2021-11-29 VITALS — BP 130/60 | HR 84 | Temp 97.8°F | Ht 64.5 in | Wt 168.0 lb

## 2021-11-29 DIAGNOSIS — Z0000 Encounter for general adult medical examination without abnormal findings: Secondary | ICD-10-CM | POA: Diagnosis not present

## 2021-11-29 DIAGNOSIS — C3490 Malignant neoplasm of unspecified part of unspecified bronchus or lung: Secondary | ICD-10-CM

## 2021-11-29 DIAGNOSIS — E782 Mixed hyperlipidemia: Secondary | ICD-10-CM

## 2021-11-29 DIAGNOSIS — R918 Other nonspecific abnormal finding of lung field: Secondary | ICD-10-CM

## 2021-11-29 DIAGNOSIS — I10 Essential (primary) hypertension: Secondary | ICD-10-CM | POA: Diagnosis not present

## 2021-11-29 DIAGNOSIS — J9611 Chronic respiratory failure with hypoxia: Secondary | ICD-10-CM | POA: Diagnosis not present

## 2021-11-29 DIAGNOSIS — I700 Atherosclerosis of aorta: Secondary | ICD-10-CM

## 2021-11-29 NOTE — Telephone Encounter (Signed)
I reviewed biopsy results with the patient by phone, show metastatic adenocarcinoma, looks like a lung primary.  She had seen Dr. Yong Channel today and heard the results then as well.  Referral has been made to see Dr. Julien Nordmann at the cancer center.  I agree with this.  Encouraged her to call me if she has any questions.

## 2021-11-29 NOTE — Progress Notes (Signed)
Oncology Nurse Navigator Documentation     11/29/2021    1:00 PM 11/29/2021   12:00 PM 05/25/2020    5:00 PM 04/26/2020    4:00 PM 03/29/2020   11:00 AM 03/03/2020    3:00 PM 02/26/2020   10:00 AM  Oncology Nurse Navigator Flowsheets  Phase of Treatment   Radiation Radiation  Surgery   Radiation Actual Start Date:    04/29/2020     Radiation Actual End Date:   05/26/2020      Surgery Actual Start Date:      03/23/2020   Navigator Follow Up Date:    05/26/2020 04/26/2020 03/29/2020 03/03/2020  Navigator Follow Up Reason:    Adjuvant Radiation Appointment Review Pathology Appointment Review;Surgery  Navigation Complete Date:   05/25/2020      Post Navigation: Continue to Follow Patient?   No      Navigator Location [facility]  Referral Date to RadOnc/MedOnc  11/29/2021       Navigator Encounter Type Telephone Telephone Appt/Treatment Plan Review Appt/Treatment Plan Review Pathology Review Appt/Treatment Plan Review [place] Follow-up;Telephone  Telephone Incoming Call/I received a call from Ms. Gerarda Fraction. I gave her an appt to see Dr. Julien Nordmann this week. She verbalized understanding of appt  Outgoing Call     [facility]inic[facility] Follow-up;Outgoing Call  Treatment Initiated Date   03/23/2020  03/23/2020    Patient Visit Type   RadOnc      Treatment Phase   Treatment      Barriers/Navigation Needs Education;Coordination of Care Coordination of Care;Education       Education Other Other       Interventions Education;Coordination of Care Coordination of Care;Education       Acuity Level 2-Minimal Needs (1-2 Barriers Identified) Level 2-Minimal Needs (1-2 Barriers Identified)       Coordination of Care Appts        Education Method Verbal Verbal       Time Spent with Patient 15 15 15 15 15 15 15   Genetic Counseling Type       None

## 2021-11-29 NOTE — Progress Notes (Signed)
Oncology Nurse Navigator Documentation     11/29/2021   12:00 PM 05/25/2020    5:00 PM 04/26/2020    4:00 PM 03/29/2020   11:00 AM 03/03/2020    3:00 PM 02/26/2020   10:00 AM 02/18/2020    1:00 PM  Oncology Nurse Navigator Flowsheets  Planned Course of Treatment       AI;Radiation;Surgery  Phase of Treatment  Radiation Radiation  Surgery    Radiation Actual Start Date:   04/29/2020      Radiation Actual End Date:  05/26/2020       Surgery Actual Start Date:     03/23/2020    Navigator Follow Up Date:   05/26/2020 04/26/2020 03/29/2020 03/03/2020 02/26/2020  Navigator Follow Up Reason:   Adjuvant Radiation Appointment Review Pathology Appointment Review;Surgery [place] F/U  Navigation Complete Date:  05/25/2020       Post Navigation: Continue to Follow Patient?  No       Navigator Location [facility]  Referral Date to RadOnc/MedOnc 11/29/2021        Navigator Encounter Type Telephone Appt/Treatment Plan Review Appt/Treatment Plan Review Pathology Review Appt/Treatment Plan Review [place] Follow-up;Telephone Clinic/[facility]  Telephone Outgoing Call     Clinic/[facility] Follow-up;Outgoing Call   Treatment Initiated Date  03/23/2020  03/23/2020     Patient Visit Type  RadOnc     Initial;MedOnc;RadOnc;Surgery  Treatment Phase  Treatment     Pre-Tx/Tx Discussion  Barriers/Navigation Needs Coordination of Care;Education        Education Other        Interventions Coordination of Care;Education        Acuity Level 2-Minimal Needs (1-2 Barriers Identified)        Education Method Verbal/I received referral on Ms. Steidle today. I called patient to schedule with Dr. Julien Nordmann this week. I was unable to reach but did leave a vm message for her to call me with my name and phone number.         Time Spent with Patient 15 15 15 15 15 15  > 120  Genetic Counseling Type      None Urgent  Genetic Counseling Date        02/18/2020

## 2021-11-29 NOTE — Progress Notes (Signed)
Phone [phone]   Subjective:  Patient presents today for their annual physical. Chief complaint-noted.   See problem oriented charting- ROS- full  review of systems was completed and negative except for: shortness of breath, fatigue  The following were reviewed and entered/updated in epic: Past Medical History:  Diagnosis Date   Alcoholism in remission ([place]) 08/27/2007   Anemia    Arthritis    OA   Breast cancer ([place])    left breast   COPD 12/17/2006   Diverticulosis 03/27/2007   GERD 05/01/2007   HYPERGLYCEMIA 08/27/2007   HYPERLIPIDEMIA 12/17/2006   HYPERTENSION 12/17/2006   Mood disorder ([place])    hx anxiety and depression. meds short term during life transition   Pneumonia    AS A CHILD   Rectal polyp 03/27/2007   adenoma   SOB (shortness of breath) on exertion    per patient due to having COPD   TIA (transient ischemic attack)    Patient Active Problem List   Diagnosis Date Noted   Mild aortic stenosis 05/24/2020    Priority: High   Brain aneurysm 12/30/2018    Priority: High   Chronic cough 10/11/2015    Priority: High   COPD (chronic obstructive pulmonary disease) ([place]) 11/18/2014    Priority: High   HYPERGLYCEMIA 08/27/2007    Priority: Medium    Gastroesophageal reflux disease 05/01/2007    Priority: Medium    Hyperlipemia 12/17/2006    Priority: Medium    Essential hypertension 12/17/2006    Priority: Medium    Former smoker 05/22/2019    Priority: Low   Aortic atherosclerosis ([place]) 04/11/2018    Priority: Low   Allergic rhinitis 11/09/2017    Priority: Low   Arm mass, left 04/16/2017    Priority: Low   Iron deficiency anemia, unspecified 01/20/2011    Priority: Low   Arthropathy 02/23/2010    Priority: Low   Alcoholism in remission ([place]) 08/27/2007    Priority: Low   Pulmonary nodules/lesions, multiple 11/15/2021   DOE (dyspnea on exertion) 10/28/2021   Acute respiratory failure with hypoxia ([place]) 10/28/2021   Insomnia 05/24/2020   Malignant  neoplasm of upper-outer quadrant of left breast in female, estrogen receptor positive ([place]) 02/12/2020   Past Surgical History:  Procedure Laterality Date   ABDOMINAL HYSTERECTOMY  1978   fibroma   APPENDECTOMY  2002   BREAST EXCISIONAL BIOPSY Right 2003   negative   BREAST LUMPECTOMY WITH RADIOACTIVE SEED LOCALIZATION Left 03/23/2020   Procedure: LEFT BREAST LUMPECTOMY WITH RADIOACTIVE SEED LOCALIZATION;  Surgeon: [name], MD;  Location: [facility];  Service: General;  Laterality: Left;  RNFA   CARPAL TUNNEL RELEASE Left 2010 or 2011   CATARACT EXTRACTION W/ INTRAOCULAR LENS  IMPLANT, BILATERAL Bilateral    COLONOSCOPY W/ POLYPECTOMY  03/27/2007; n7/19/2012   2008: 4 mm adenoma, diverticulosis 2012: ileitis ? NSAID - likely, 2-3 mm cecal polyp LYMPHOID FOLLICLE, diverticulosis   ESOPHAGOGASTRODUODENOSCOPY  01/26/2011   reflux esophagitis, colonscopy done also   HAMMER TOE SURGERY  2008   left foot   HERNIA REPAIR Right 1996?   IR ANGIO INTRA EXTRACRAN SEL COM CAROTID INNOMINATE BILAT MOD SED  10/25/2021   IR ANGIO INTRA EXTRACRAN SEL COM CAROTID INNOMINATE UNI R MOD SED  11/28/2018   IR ANGIO INTRA EXTRACRAN SEL COM CAROTID INNOMINATE UNI R MOD SED  03/17/2021   IR ANGIO INTRA EXTRACRAN SEL INTERNAL CAROTID UNI R MOD SED  12/30/2018   IR ANGIO VERTEBRAL SEL SUBCLAVIAN INNOMINATE UNI  R MOD SED  11/28/2018   IR ANGIO VERTEBRAL SEL SUBCLAVIAN INNOMINATE UNI R MOD SED  10/26/2021   IR ANGIO VERTEBRAL SEL VERTEBRAL UNI R MOD SED  03/18/2021   IR ANGIOGRAM FOLLOW UP STUDY  12/30/2018   IR TRANSCATH/EMBOLIZ  12/30/2018   IR US GUIDE VASC ACCESS RIGHT  11/28/2018   IR US GUIDE VASC ACCESS RIGHT  03/17/2021   ORIF ANKLE FRACTURE Right 12/22/2014   Procedure: OPEN REDUCTION INTERNAL FIXATION (ORIF) ANKLE FRACTURE;  Surgeon: Susa Day, MD;  Location: [facility];  Service: Orthopedics;  Laterality: Right;   RADIOLOGY WITH ANESTHESIA N/A 12/30/2018   Procedure: RADIOLOGY WITH ANESTHESIA;  Surgeon: Luanne Bras, MD;  Location: [facility];  Service: Radiology;  Laterality: N/A;   SHOULDER OPEN ROTATOR CUFF REPAIR Left 03/06/2013   Procedure: LEFT ROTATOR CUFF REPAIR, SUBACROMIAL DECOMPRESSION, PATCH GRAFT, MANIPULATION UNDER ANESTHESIA;  Surgeon: Johnn Hai, MD;  Location: [facility];  Service: Orthopedics;  Laterality: Left;   TONSILLECTOMY      Family History  Problem Relation Age of Onset   Throat cancer Mother        21   Heart attack Father 16   Idiopathic pulmonary fibrosis Brother 56   Cancer Brother        sarcoma   Heart disease Brother    Colon cancer Neg Hx    Esophageal cancer Neg Hx    Stomach cancer Neg Hx     Medications- reviewed and updated Current Outpatient Medications  Medication Sig Dispense Refill   acetaminophen (TYLENOL) 650 MG CR tablet Take 1,300 mg by mouth 2 (two) times daily.     albuterol (PROAIR HFA) 108 (90 Base) MCG/ACT inhaler INHALE 2 PUFFS EVERY 4 HOURS AS NEEDED FOR COUGHING SPELLS 18 g 5   aspirin 325 MG tablet Take 1 tablet (325 mg total) by mouth daily. 30 tablet 3   azelastine (ASTELIN) 0.1 % nasal spray Place 2 sprays into both nostrils 2 (two) times daily. Use in each nostril as directed 30 mL 12   benzonatate (TESSALON) 200 MG capsule Take 1 capsule (200 mg total) by mouth 3 (three) times daily as needed for cough. 30 capsule 1   Cholecalciferol (VITAMIN D) 50 MCG (2000 UT) tablet Take 2,000 Units by mouth daily.     clopidogrel (PLAVIX) 75 MG tablet Take 0.5 tablets (37.5 mg total) by mouth daily. 46 tablet 3   Coenzyme Q10 300 MG CAPS Take 300 mg by mouth daily.     fexofenadine (ALLEGRA) 180 MG tablet Take 180 mg by mouth daily.     fluticasone (FLONASE) 50 MCG/ACT nasal spray Place 2 sprays into both nostrils daily. 16 g 2   Glycopyrrolate-Formoterol (BEVESPI AEROSPHERE) 9-4.8 MCG/ACT AERO Inhale 2 puffs into the lungs 2 (two) times daily. 10.7 g 5   hydrochlorothiazide (HYDRODIURIL) 25 MG tablet Take 0.5 tablets (12.5 mg total) by mouth  daily. 46 tablet 3   montelukast (SINGULAIR) 10 MG tablet Take 1 tablet (10 mg total) by mouth at bedtime. 30 tablet 5   Multiple Vitamin (MULTIVITAMIN WITH MINERALS) TABS tablet Take 1 tablet by mouth daily.     pantoprazole (PROTONIX) 40 MG tablet Take 1 tablet (40 mg total) by mouth daily. 30 tablet 5   potassium chloride SA (KLOR-CON M20) 20 MEQ tablet Take 1 tablet (20 mEq total) by mouth every other day. 46 tablet 3   Propylene Glycol (SYSTANE BALANCE OP) Place 1 drop into both eyes daily.  rosuvastatin (CRESTOR) 20 MG tablet Take 1 tablet (20 mg total) by mouth daily. 90 tablet 3   tamoxifen (NOLVADEX) 20 MG tablet Take 1 tablet (20 mg total) by mouth daily. 90 tablet 3   traMADol (ULTRAM) 50 MG tablet TAKE ONE TABLET BY MOUTH EVERY 8 HOURS AS NEEDED FOR PAIN FOR UP TO 5 DAYS (DO NOT DRIVE FOR 8 HOURS AFTER TAKING) 45 tablet 5   traZODone (DESYREL) 50 MG tablet TAKE 1 TABLET BY MOUTH EVERY NIGHT AT BEDTIME AS NEEDED FOR SLEEP (Patient taking differently: Take 50 mg by mouth at bedtime.) 90 tablet 3   Turmeric (QC TUMERIC COMPLEX PO) Take 1,500 mg by mouth daily.     vitamin B-12 (CYANOCOBALAMIN) 1000 MCG tablet Take 1,000 mcg by mouth daily.     No current facility-administered medications for this visit.    Allergies-reviewed and updated Allergies  Allergen Reactions   Alcohol Other (See Comments)    Recovering Alcoholic*   Cheese Nausea And Vomiting   Codeine     Patient prefers not to take this due to history of alcoholism   Dilaudid [Hydromorphone Hcl]     Pt had hypotension after dilaudid 1mg  IV while in the OR, required temporary pressor intervention.    Social History   Social History Narrative   Home Situation: lives with husband. 2 daughters, 2 step daughters, 6 grandkids. 2 greatgrandkids    Work or School: very active in [employer] and church      Spiritual Beliefs: Christian      Lifestyle:tries to walk; diet ok - denies any alcohol or tobacco use in many many years       Caffeine 1 cup coffee/day      Hobbies: news junkie, audio books      Left Handed      Lives in one story home   Objective  Objective:  BP 130/60 (BP Location: Left Arm, Patient Position: Sitting, Cuff Size: Large)   Pulse 84   Temp 97.8 F (36.6 C) (Temporal)   Ht 5' 4.5" (1.638 m)   Wt 168 lb (76.2 kg)   SpO2 95%   BMI 28.39 kg/m  Gen: NAD, resting comfortably HEENT: Mucous membranes are moist. Oropharynx normal Neck: no thyromegaly CV: RRR no murmurs rubs or gallops Lungs: CTAB no crackles, wheeze, rhonchi.  Appears winded when getting onto the table Abdomen: soft/nontender/nondistended/normal bowel sounds. No rebound or guarding.  Ext: no edema Skin: warm, dry Neuro: grossly normal, moves all extremities, PERRLA   Assessment and Plan   87 y.o. female presenting for annual physical.  Health Maintenance counseling: 1. Anticipatory guidance: Patient counseled regarding regular dental exams -q6 months, eye exams - yearly,  avoiding smoking and second hand smoke , limiting alcohol to 1 beverage per day- none recovering alcoholic- over 40 years sober! , no illicit drugs .   2. Risk factor reduction:  Advised patient of need for regular exercise and diet rich and fruits and vegetables to reduce risk of heart attack and stroke.  Exercise- limited right now with breathing and pain issues.  Diet/weight management-down 10 lbs in last several months- cancer could be contributor Wt Readings from Last 3 Encounters:  11/29/21 168 lb (76.2 kg)  11/17/21 169 lb (76.7 kg)  11/15/21 171 lb 3.2 oz (77.7 kg)  3. Immunizations/screenings/ancillary studies- plans next covid booster on thursday Immunization History  Administered Date(s) Administered   Fluad Quad(high Dose 65+) 04/04/2019, 03/22/2021   Influenza Split 04/09/2012   Influenza Whole 05/01/2007,  04/06/2008, 04/21/2009, 03/24/2010, 04/10/2011   Influenza, High Dose Seasonal PF 04/09/2015, 03/17/2016, 05/22/2016,  04/16/2017, 05/21/2017, 03/20/2018, 05/20/2018, 05/26/2019, 04/10/2020, 05/26/2020, 05/26/2021   Influenza,inj,Quad PF,6+ Mos 03/03/2013   Influenza,inj,quad, With Preservative 03/20/2018   Influenza-Unspecified 04/09/2014, 04/09/2015, 04/09/2017, 03/20/2018   PFIZER(Purple Top)SARS-COV-2 Vaccination 07/27/2019, 08/14/2019, 04/30/2020, 05/26/2020, 12/07/2020   Pfizer Covid-19 Vaccine Bivalent Booster 63yrs & up 06/15/2021   Pneumococcal Conjugate-13 05/05/2015   Pneumococcal Polysaccharide-23 04/09/2004, 05/21/2017, 05/20/2018, 05/26/2019, 05/26/2020, 05/26/2021   Td 12/08/2005   Tdap 12/22/2014   Zoster Recombinat (Shingrix) 08/01/2017, 10/03/2017   Zoster, Live 01/06/2008   4. Cervical cancer screening- passed age based screening recs 5. Breast cancer screening- follow up see below- on tamoxifen 6. Colon cancer screening - past age based screening recommendations 7. Skin cancer screening- Jarome Matin sees her yearly. advised regular sunscreen use. Denies worrisome, changing, or new skin lesions.  8. Birth control/STD check- only active with husband 66. Osteoporosis screening at 65- bone density reassuring 02/08/21 10. Smoking associated screening - former smoker quit in 1980s  Status of chronic or acute concerns   #breast cancer left breast discovered 02/10/20-  Dr. Barry Dienes surgeon and Dr. Burr Medico alternates visits every 6 months. On chronic tamoxifen after definitive therapy. Sees Dr. Barry Dienes in june  #primary lung cancer with mets Unfortunately biopsy 11/17/21 with mets to bone of right acetabulum- lung adenocarcinoma - right upper thigh and lateral hip discomfort after biopsy. Pain 3/10. Took 650 tylenol this morning and somewhat helpful.  -has a lot of fatigue but thinks from stress of waiting on biopsy results -reached out to Dr. Burr Medico who will work patient in either with herself or Dr. Earlie Server- will be worked in Friday it appears with Dr. Earlie Server  #History of brain aneurysm-follows with  Dr. Estanislado Pandy- half dose plavix, full dose aspirin- good report on last scan was told resolved!  But is to remain on treatment- 2 year follow up apparently!  #hypertension S: medication: Hydrochlorothiazide 12.5 mg-also takes potassium with this every other day. Reduced from 25 mg on 11/23/20 BP Readings from Last 3 Encounters:  11/29/21 130/60  11/17/21 122/63  11/15/21 134/74  A/P: Controlled. Continue current medications.    #hyperlipidemia with aortic atherosclerosis S: Medication: Rosuvastatin 20 mg daily-previously on simvastatin and had myalgias and arthralgias.  Also did not tolerate atorvastatin Lab Results  Component Value Date   CHOL 131 11/23/2020   HDL 52.50 11/23/2020   LDLCALC 42 11/23/2020   LDLDIRECT 39.0 05/26/2021   TRIG 185.0 (H) 11/23/2020   CHOLHDL 3 11/23/2020  A/P:  stable in November at goal- consider recheck in future but holding off on labs today with upcoming cancer treatments/labs.  Continue current meds for now  Aortic atherosclerosis- ldl goal under 70- at goal- presumed stbale  # GERD S:Medication: Protonix 20 mg. A/P: reasonable control- with upcoming cancer treatments stay on current dose    #COPD-was told Dr. Lamonte Sakai in the past #chronic respiratory failure 2L at \\night  S: Medication: Albuterol as needed-typically rarely ever since starting Bevespi. On 2L of oxygen at home mianly for sleep  A/P: improved with Bevespi and oxygen   #Anemia- patient with history of anemia- believes off iron- anemia stable Lab Results  Component Value Date   WBC 5.9 11/17/2021   HGB 14.1 11/17/2021   HCT 41.3 11/17/2021   MCV 94.9 11/17/2021   PLT 197 11/17/2021   #Insomnia S: Patient with intermittent issues for 25 years with sleep.  Trial of trazodone November 2021-doing better lately with this- continue  current meds  #Mild aortic stenosis discovered 06/29/2020- we will plan to repeat every 2 to 3 years- she prefers to wait for now with upcoming cancer  treatments    #Joint pain and history sciatica- turmeric helpful, sparing tramadol  for joint pain   #alcoholism in remission-now 40 years sober this year!   # Hyperglycemia/insulin resistance/prediabetes-peak A1c 6.4 in 2018-weight loss since that time S:  Medication: none- a1c has done ok with weight loss though could be cancer related Lab Results  Component Value Date   HGBA1C 5.9 05/26/2021   A/P: we opted to hold off on labs for now- consider again next visit  Recommended follow up: Return in about 6 months (around 06/01/2022) for followup or sooner if needed.Schedule b4 you leave. Future Appointments  Date Time Provider [place]  01/03/2022 11:45 AM Collene Gobble, MD [facility] None  04/04/2022  9:30 AM [facility] None  04/04/2022 10:00 AM Alla Feeling, NP [facility] None  08/25/2022  9:30 AM [facility] HEALTH COACH [facility]   Lab/Order associations:NOT fasting   ICD-10-CM   1. Preventative health care  Z00.00     2. Primary malignant neoplasm of lung metastatic to other site, unspecified laterality [facility])  C34.90 Ambulatory referral to Hematology / Oncology    3. Chronic respiratory failure with hypoxia (HCC)  J96.11     4. Aortic atherosclerosis (HCC) Chronic I70.0     5. Mixed hyperlipidemia  E78.2     6. Essential hypertension  I10       No orders of the defined types were placed in this encounter.   Return precautions advised.  Garret Reddish, MD

## 2021-11-29 NOTE — Patient Instructions (Addendum)
Holding off on labs for now  Oncology should call about appointment for friday  Recommended follow up: Return in about 6 months (around 06/01/2022) for followup or sooner if needed.Schedule b4 you leave.

## 2021-11-30 DIAGNOSIS — J9611 Chronic respiratory failure with hypoxia: Secondary | ICD-10-CM | POA: Diagnosis not present

## 2021-11-30 DIAGNOSIS — J441 Chronic obstructive pulmonary disease with (acute) exacerbation: Secondary | ICD-10-CM | POA: Diagnosis not present

## 2021-12-02 ENCOUNTER — Encounter: Payer: Self-pay | Admitting: Internal Medicine

## 2021-12-02 ENCOUNTER — Inpatient Hospital Stay: Payer: Medicare HMO

## 2021-12-02 ENCOUNTER — Inpatient Hospital Stay: Payer: Medicare HMO | Attending: Nurse Practitioner | Admitting: Internal Medicine

## 2021-12-02 ENCOUNTER — Encounter: Payer: Self-pay | Admitting: *Deleted

## 2021-12-02 ENCOUNTER — Other Ambulatory Visit: Payer: Self-pay

## 2021-12-02 VITALS — BP 110/51 | HR 79 | Temp 97.7°F | Resp 18 | Ht 64.5 in | Wt 170.7 lb

## 2021-12-02 DIAGNOSIS — Z800 Family history of malignant neoplasm of digestive organs: Secondary | ICD-10-CM | POA: Diagnosis not present

## 2021-12-02 DIAGNOSIS — E041 Nontoxic single thyroid nodule: Secondary | ICD-10-CM | POA: Insufficient documentation

## 2021-12-02 DIAGNOSIS — Z7982 Long term (current) use of aspirin: Secondary | ICD-10-CM | POA: Diagnosis not present

## 2021-12-02 DIAGNOSIS — Z853 Personal history of malignant neoplasm of breast: Secondary | ICD-10-CM | POA: Diagnosis not present

## 2021-12-02 DIAGNOSIS — C7951 Secondary malignant neoplasm of bone: Secondary | ICD-10-CM | POA: Diagnosis not present

## 2021-12-02 DIAGNOSIS — Z8673 Personal history of transient ischemic attack (TIA), and cerebral infarction without residual deficits: Secondary | ICD-10-CM | POA: Insufficient documentation

## 2021-12-02 DIAGNOSIS — C3431 Malignant neoplasm of lower lobe, right bronchus or lung: Secondary | ICD-10-CM | POA: Insufficient documentation

## 2021-12-02 DIAGNOSIS — C3490 Malignant neoplasm of unspecified part of unspecified bronchus or lung: Secondary | ICD-10-CM

## 2021-12-02 DIAGNOSIS — I10 Essential (primary) hypertension: Secondary | ICD-10-CM | POA: Diagnosis not present

## 2021-12-02 DIAGNOSIS — C50412 Malignant neoplasm of upper-outer quadrant of left female breast: Secondary | ICD-10-CM | POA: Diagnosis not present

## 2021-12-02 DIAGNOSIS — M1990 Unspecified osteoarthritis, unspecified site: Secondary | ICD-10-CM | POA: Insufficient documentation

## 2021-12-02 DIAGNOSIS — J432 Centrilobular emphysema: Secondary | ICD-10-CM | POA: Insufficient documentation

## 2021-12-02 DIAGNOSIS — E785 Hyperlipidemia, unspecified: Secondary | ICD-10-CM | POA: Diagnosis not present

## 2021-12-02 DIAGNOSIS — R590 Localized enlarged lymph nodes: Secondary | ICD-10-CM | POA: Insufficient documentation

## 2021-12-02 DIAGNOSIS — Z9049 Acquired absence of other specified parts of digestive tract: Secondary | ICD-10-CM | POA: Diagnosis not present

## 2021-12-02 DIAGNOSIS — Z79899 Other long term (current) drug therapy: Secondary | ICD-10-CM | POA: Diagnosis not present

## 2021-12-02 DIAGNOSIS — F1011 Alcohol abuse, in remission: Secondary | ICD-10-CM | POA: Insufficient documentation

## 2021-12-02 DIAGNOSIS — Z8719 Personal history of other diseases of the digestive system: Secondary | ICD-10-CM | POA: Diagnosis not present

## 2021-12-02 DIAGNOSIS — K219 Gastro-esophageal reflux disease without esophagitis: Secondary | ICD-10-CM | POA: Diagnosis not present

## 2021-12-02 DIAGNOSIS — Z170 Estrogen receptor positive status [ER+]: Secondary | ICD-10-CM | POA: Diagnosis not present

## 2021-12-02 DIAGNOSIS — Z87891 Personal history of nicotine dependence: Secondary | ICD-10-CM | POA: Diagnosis not present

## 2021-12-02 DIAGNOSIS — J90 Pleural effusion, not elsewhere classified: Secondary | ICD-10-CM | POA: Diagnosis not present

## 2021-12-02 DIAGNOSIS — R918 Other nonspecific abnormal finding of lung field: Secondary | ICD-10-CM

## 2021-12-02 LAB — CMP (CANCER CENTER ONLY)
ALT: 10 U/L (ref 0–44)
AST: 16 U/L (ref 15–41)
Albumin: 3.7 g/dL (ref 3.5–5.0)
Alkaline Phosphatase: 58 U/L (ref 38–126)
Anion gap: 5 (ref 5–15)
BUN: 14 mg/dL (ref 8–23)
CO2: 32 mmol/L (ref 22–32)
Calcium: 9.7 mg/dL (ref 8.9–10.3)
Chloride: 105 mmol/L (ref 98–111)
Creatinine: 0.83 mg/dL (ref 0.44–1.00)
GFR, Estimated: >60 mL/min (ref >60)
Glucose, Bld: 130 mg/dL — ABNORMAL HIGH (ref 70–99)
Potassium: 3.7 mmol/L (ref 3.5–5.1)
Sodium: 142 mmol/L (ref 135–145)
Total Bilirubin: 0.5 mg/dL (ref 0.3–1.2)
Total Protein: 6.3 g/dL — ABNORMAL LOW (ref 6.5–8.1)

## 2021-12-02 LAB — CBC WITH DIFFERENTIAL (CANCER CENTER ONLY)
Abs Immature Granulocytes: 0.02 10*3/uL (ref 0.00–0.07)
Basophils Absolute: 0.1 10*3/uL (ref 0.0–0.1)
Basophils Relative: 1 %
Eosinophils Absolute: 0.5 10*3/uL (ref 0.0–0.5)
Eosinophils Relative: 8 %
HCT: 40.4 % (ref 36.0–46.0)
Hemoglobin: 13.6 g/dL (ref 12.0–15.0)
Immature Granulocytes: 0 %
Lymphocytes Relative: 14 %
Lymphs Abs: 0.9 10*3/uL (ref 0.7–4.0)
MCH: 31.5 pg (ref 26.0–34.0)
MCHC: 33.7 g/dL (ref 30.0–36.0)
MCV: 93.5 fL (ref 80.0–100.0)
Monocytes Absolute: 0.7 10*3/uL (ref 0.1–1.0)
Monocytes Relative: 11 %
Neutro Abs: 4.2 10*3/uL (ref 1.7–7.7)
Neutrophils Relative %: 66 %
Platelet Count: 210 10*3/uL (ref 150–400)
RBC: 4.32 MIL/uL (ref 3.87–5.11)
RDW: 13.2 % (ref 11.5–15.5)
WBC Count: 6.4 10*3/uL (ref 4.0–10.5)
nRBC: 0 % (ref 0.0–0.2)

## 2021-12-02 NOTE — Progress Notes (Unsigned)
[place] [phone]   [phone]  CONSULT NOTE  REFERRING PHYSICIAN: Dr. Baltazar Apo  REASON FOR CONSULTATION:  87 years old white female recently diagnosed with lung cancer  HPI Sheila Shields is a 87 y.o. female with past medical history significant for anemia, osteoarthritis, COPD, GERD, hypertension, dyslipidemia, TIA, diverticulosis as well as history of stage Ia (T1b, N0, M0, G2, ER positive, PR positive, HER2 negative upper outer quadrant of the left breast carcinoma diagnosed in August 2021.  She is status post left breast lumpectomy with radioactive seed localization.  The patient also received adjuvant radiotherapy under the care of Dr. Lisbeth Renshaw and has been on treatment with tamoxifen 20 mg p.o. daily.  The patient has been complaining of increasing shortness of breath with ambulation as well as cough.  She has a chest x-ray on 10/28/2021 and it showed no acute cardiopulmonary process.  This was followed by CT angiogram of the chest on the same day and it showed no evidence of pulmonary embolism but there was marked severity emphysematous lung disease.  There was also 2.5 x 2.3 cm heterogeneous left lower lobe lung mass consistent with an underlying neoplasm.  There was also multiple bilateral subcentimeter lung nodules with pulmonary metastasis.  The patient also had 3.5 x 2.5 x 3.4 cm well-defined soft tissue mass within the left breast with adjacent surgical clips.  The scan also showed low-attenuation liver lesions suspicious for multiple hepatic cysts.  She had a PET scan on 11/09/2021 and it showed hypermetabolic 2.3 cm left lower lobe pulmonary nodule with other subcentimeter bilateral pulmonary nodules showing FDG uptake highly suspicious for pulmonary metastasis.  There was mild hypermetabolic left hilar and mediastinal lymphadenopathy consistent with metastatic disease.  There was single normal size right axillary lymph node that is hypermetabolic  and is indeterminate.  There was also several hypermetabolic bone metastasis in the spine and pelvis.  The scan also showed 2.0 cm hypermetabolic right thyroid lobe nodule and malignancy was not excluded.  She had CT guided bone biopsy from the right periacetabular iliac lesion by interventional radiology on 11/17/2021.  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 Immunohistochemical stain for CK7, TTF-1 and Napsin A are positive in the tumor cells while immunostains for CK20, GATA3, GCDFP-15, CDX2, PAX8 and ER are negative.  This immunoprofile is consistent with above interpretation. The patient was referred to me today for evaluation and recommendation regarding treatment of her condition. When seen today she continues to complain of pain on the right leg started after the bone biopsy.  She is currently on Tylenol and occasional tramadol as needed.  She denied having any chest pain but has shortness of breath with exertion with mild cough and she sleeps with oxygen at nighttime.  She has no hemoptysis.  She lost around 30 pounds since September 2022.  She has no nausea, vomiting, diarrhea or constipation.  She has no headache or visual changes. Family history significant for mother with throat cancer and died at age 39.  Father had coronary artery disease and died at age 33.  Her brother had sarcoma and another brother had heart disease. The patient is married and has 2 daughters.  She was accompanied today by her husband Sheila Shields.  She used to work as a Radio broadcast assistant and currently retired.  She has a history for smoking for around 30 years and quit in 1986.  She also has a history  of alcohol abuse and quit in 1983.  She has no history of drug abuse.  HPI  Past Medical History:  Diagnosis Date   Alcoholism in remission (HCC) 08/27/2007   Anemia    Arthritis    OA   Breast cancer (HCC)    left breast   COPD 12/17/2006    Diverticulosis 03/27/2007   GERD 05/01/2007   HYPERGLYCEMIA 08/27/2007   HYPERLIPIDEMIA 12/17/2006   HYPERTENSION 12/17/2006   Mood disorder (HCC)    hx anxiety and depression. meds short term during life transition   Pneumonia    AS A CHILD   Rectal polyp 03/27/2007   adenoma   SOB (shortness of breath) on exertion    per patient due to having COPD   TIA (transient ischemic attack)     Past Surgical History:  Procedure Laterality Date   ABDOMINAL HYSTERECTOMY  1978   fibroma   APPENDECTOMY  2002   BREAST EXCISIONAL BIOPSY Right 2003   negative   BREAST LUMPECTOMY WITH RADIOACTIVE SEED LOCALIZATION Left 03/23/2020   Procedure: LEFT BREAST LUMPECTOMY WITH RADIOACTIVE SEED LOCALIZATION;  Surgeon: Byerly, Faera, MD;  Location: [facility] OR;  Service: General;  Laterality: Left;  RNFA   CARPAL TUNNEL RELEASE Left 2010 or 2011   CATARACT EXTRACTION W/ INTRAOCULAR LENS  IMPLANT, BILATERAL Bilateral    COLONOSCOPY W/ POLYPECTOMY  03/27/2007; n7/19/2012   2008: 4 mm adenoma, diverticulosis 2012: ileitis ? NSAID - likely, 2-3 mm cecal polyp LYMPHOID FOLLICLE, diverticulosis   ESOPHAGOGASTRODUODENOSCOPY  01/26/2011   reflux esophagitis, colonscopy done also   HAMMER TOE SURGERY  2008   left foot   HERNIA REPAIR Right 1996?   IR ANGIO INTRA EXTRACRAN SEL COM CAROTID INNOMINATE BILAT MOD SED  10/25/2021   IR ANGIO INTRA EXTRACRAN SEL COM CAROTID INNOMINATE UNI R MOD SED  11/28/2018   IR ANGIO INTRA EXTRACRAN SEL COM CAROTID INNOMINATE UNI R MOD SED  03/17/2021   IR ANGIO INTRA EXTRACRAN SEL INTERNAL CAROTID UNI R MOD SED  12/30/2018   IR ANGIO VERTEBRAL SEL SUBCLAVIAN INNOMINATE UNI R MOD SED  11/28/2018   IR ANGIO VERTEBRAL SEL SUBCLAVIAN INNOMINATE UNI R MOD SED  10/26/2021   IR ANGIO VERTEBRAL SEL VERTEBRAL UNI R MOD SED  03/18/2021   IR ANGIOGRAM FOLLOW UP STUDY  12/30/2018   IR TRANSCATH/EMBOLIZ  12/30/2018   IR US GUIDE VASC ACCESS RIGHT  11/28/2018   IR US GUIDE VASC ACCESS RIGHT  03/17/2021   ORIF ANKLE  FRACTURE Right 12/22/2014   Procedure: OPEN REDUCTION INTERNAL FIXATION (ORIF) ANKLE FRACTURE;  Surgeon: Jeffrey Beane, MD;  Location: [facility];  Service: Orthopedics;  Laterality: Right;   RADIOLOGY WITH ANESTHESIA N/A 12/30/2018   Procedure: RADIOLOGY WITH ANESTHESIA;  Surgeon: Deveshwar, Sanjeev, MD;  Location: [facility];  Service: Radiology;  Laterality: N/A;   SHOULDER OPEN ROTATOR CUFF REPAIR Left 03/06/2013   Procedure: LEFT ROTATOR CUFF REPAIR, SUBACROMIAL DECOMPRESSION, PATCH GRAFT, MANIPULATION UNDER ANESTHESIA;  Surgeon: Jeffrey C Beane, MD;  Location: [facility];  Service: Orthopedics;  Laterality: Left;   TONSILLECTOMY      Family History  Problem Relation Age of Onset   Throat cancer Mother        1976   Heart attack Father 66   Idiopathic pulmonary fibrosis Brother 70   Cancer Brother        sarcoma   Heart disease Brother    Colon cancer Neg Hx    Esophageal cancer Neg Hx    Stomach   cancer Neg Hx     Social History Social History   Tobacco Use   Smoking status: Former    Packs/day: 2.00    Years: 30.00    Pack years: 60.00    Types: Cigarettes    Quit date: 07/10/1984    Years since quitting: 37.4   Smokeless tobacco: Never  Vaping Use   Vaping Use: Never used  Substance Use Topics   Alcohol use: No    Alcohol/week: 0.0 standard drinks    Comment: no alcoho since 1983   Drug use: No    Allergies  Allergen Reactions   Alcohol Other (See Comments)    Recovering Alcoholic*   Cheese Nausea And Vomiting   Codeine     Patient prefers not to take this due to history of alcoholism   Dilaudid [Hydromorphone Hcl]     Pt had hypotension after dilaudid 1mg IV while in the OR, required temporary pressor intervention.    Current Outpatient Medications  Medication Sig Dispense Refill   acetaminophen (TYLENOL) 650 MG CR tablet Take 1,300 mg by mouth 2 (two) times daily.     albuterol (PROAIR HFA) 108 (90 Base) MCG/ACT inhaler INHALE 2 PUFFS EVERY 4 HOURS AS NEEDED FOR  COUGHING SPELLS 18 g 5   aspirin 325 MG tablet Take 1 tablet (325 mg total) by mouth daily. 30 tablet 3   azelastine (ASTELIN) 0.1 % nasal spray Place 2 sprays into both nostrils 2 (two) times daily. Use in each nostril as directed 30 mL 12   benzonatate (TESSALON) 200 MG capsule Take 1 capsule (200 mg total) by mouth 3 (three) times daily as needed for cough. 30 capsule 1   Cholecalciferol (VITAMIN D) 50 MCG (2000 UT) tablet Take 2,000 Units by mouth daily.     clopidogrel (PLAVIX) 75 MG tablet Take 0.5 tablets (37.5 mg total) by mouth daily. 46 tablet 3   Coenzyme Q10 300 MG CAPS Take 300 mg by mouth daily.     fexofenadine (ALLEGRA) 180 MG tablet Take 180 mg by mouth daily.     fluticasone (FLONASE) 50 MCG/ACT nasal spray Place 2 sprays into both nostrils daily. 16 g 2   Glycopyrrolate-Formoterol (BEVESPI AEROSPHERE) 9-4.8 MCG/ACT AERO Inhale 2 puffs into the lungs 2 (two) times daily. 10.7 g 5   hydrochlorothiazide (HYDRODIURIL) 25 MG tablet Take 0.5 tablets (12.5 mg total) by mouth daily. 46 tablet 3   montelukast (SINGULAIR) 10 MG tablet Take 1 tablet (10 mg total) by mouth at bedtime. 30 tablet 5   Multiple Vitamin (MULTIVITAMIN WITH MINERALS) TABS tablet Take 1 tablet by mouth daily.     pantoprazole (PROTONIX) 40 MG tablet Take 1 tablet (40 mg total) by mouth daily. 30 tablet 5   potassium chloride SA (KLOR-CON M20) 20 MEQ tablet Take 1 tablet (20 mEq total) by mouth every other day. 46 tablet 3   Propylene Glycol (SYSTANE BALANCE OP) Place 1 drop into both eyes daily.     rosuvastatin (CRESTOR) 20 MG tablet Take 1 tablet (20 mg total) by mouth daily. 90 tablet 3   tamoxifen (NOLVADEX) 20 MG tablet Take 1 tablet (20 mg total) by mouth daily. 90 tablet 3   traMADol (ULTRAM) 50 MG tablet TAKE ONE TABLET BY MOUTH EVERY 8 HOURS AS NEEDED FOR PAIN FOR UP TO 5 DAYS (DO NOT DRIVE FOR 8 HOURS AFTER TAKING) 45 tablet 5   traZODone (DESYREL) 50 MG tablet TAKE 1 TABLET BY MOUTH EVERY NIGHT AT  BEDTIME   AS NEEDED FOR SLEEP (Patient taking differently: Take 50 mg by mouth at bedtime.) 90 tablet 3   Turmeric (QC TUMERIC COMPLEX PO) Take 1,500 mg by mouth daily.     vitamin B-12 (CYANOCOBALAMIN) 1000 MCG tablet Take 1,000 mcg by mouth daily.     No current facility-administered medications for this visit.    Review of Systems  Constitutional: positive for weight loss Eyes: negative Ears, nose, mouth, throat, and face: negative Respiratory: positive for cough and dyspnea on exertion Cardiovascular: negative Gastrointestinal: negative Genitourinary:negative Integument/breast: negative Hematologic/lymphatic: negative Musculoskeletal:positive for bone pain Neurological: negative Behavioral/Psych: negative Endocrine: negative Allergic/Immunologic: negative  Physical Exam  RAL:alert, healthy, no distress, well nourished, and well developed SKIN: skin color, texture, turgor are normal, no rashes or significant lesions HEAD: Normocephalic, No masses, lesions, tenderness or abnormalities EYES: normal, PERRLA, Conjunctiva are pink and non-injected EARS: External ears normal, Canals clear OROPHARYNX:no exudate, no erythema, and lips, buccal mucosa, and tongue normal  NECK: supple, no adenopathy, no JVD LYMPH:  no palpable lymphadenopathy, no hepatosplenomegaly BREAST:not examined LUNGS: clear to auscultation , and palpation HEART: regular rate & rhythm, no murmurs, and no gallops ABDOMEN:abdomen soft, non-tender, normal bowel sounds, and no masses or organomegaly BACK: Back symmetric, no curvature., No CVA tenderness EXTREMITIES:no joint deformities, effusion, or inflammation, no edema  NEURO: alert & oriented x 3 with fluent speech, no focal motor/sensory deficits  PERFORMANCE STATUS: ECOG 1  LABORATORY DATA: Lab Results  Component Value Date   WBC 6.4 12/02/2021   HGB 13.6 12/02/2021   HCT 40.4 12/02/2021   MCV 93.5 12/02/2021   PLT 210 12/02/2021      Chemistry       Component Value Date/Time   NA 140 10/25/2021 0700   K 4.0 10/25/2021 0700   CL 102 10/25/2021 0700   CO2 27 10/25/2021 0700   BUN 16 10/25/2021 0700   CREATININE 0.86 10/25/2021 0700   CREATININE 0.94 09/12/2021 0952   CREATININE 0.76 05/24/2020 1414      Component Value Date/Time   CALCIUM 9.0 10/25/2021 0700   ALKPHOS 52 09/12/2021 0952   AST 15 09/12/2021 0952   ALT 14 09/12/2021 0952   BILITOT 0.6 09/12/2021 0952       RADIOGRAPHIC STUDIES: NM PET Image Initial (PI) Skull Base To Thigh  Result Date: 11/09/2021 CLINICAL DATA:  Initial treatment strategy for left lung nodule. Personal history of left breast carcinoma. EXAM: NUCLEAR MEDICINE PET SKULL BASE TO THIGH TECHNIQUE: 8.1 mCi F-18 FDG was injected intravenously. Full-ring PET imaging was performed from the skull base to thigh after the radiotracer. CT data was obtained and used for attenuation correction and anatomic localization. Fasting blood glucose: 109 mg/dl COMPARISON:  Chest CTA on 10/28/2021 FINDINGS: Mediastinal blood-pool activity (background): SUV max = 2.5 Liver activity (reference): SUV max = N/A NECK: No hypermetabolic lymph nodes. A right thyroid lobe nodule with coarse calcification measuring approximately 2 cm shows hypermetabolic activity, with SUV max of 8.4. Incidental CT findings:  None. CHEST: 2.3 cm microlobulated left lower lobe pulmonary nodule shows intense hypermetabolism, with SUV max of 18.6. A 7 mm pulmonary nodule in the lateral left upper lobe on image 18/7 is also hypermetabolic, with SUV max 3.3. Other tiny less than 5 mm pulmonary nodules are seen bilaterally which also show mild FDG uptake. Stable small left pleural effusion without focal hypermetabolic activity. Mild left hilar lymphadenopathy is hypermetabolic with SUV max of 7.4. Mild mediastinal lymphadenopathy is seen in the pretracheal, AP window,   lateral aortic, and subcarinal regions, with index area in subcarinal region measuring 1.3 cm  short axis with SUV max of 11.9. A normal size right axillary lymph node is seen on image 59/4 which shows mild hypermetabolism with SUV max of 3.4. Fat necrosis is noted in the left breast at site of surgical clips, which shows mild peripheral FDG uptake. Incidental CT findings: Moderate to severe centrilobular emphysema. Aortic and coronary atherosclerotic calcification noted. ABDOMEN/PELVIS: No abnormal hypermetabolic activity within the liver, pancreas, adrenal glands, or spleen. No hypermetabolic lymph nodes in the abdomen or pelvis. Incidental CT findings: Tiny hepatic cysts show no hypermetabolic activity. Small calcified gallstone, without evidence of cholecystitis. Aortic atherosclerotic calcification noted. Colonic diverticulosis noted, without evidence of diverticulitis. SKELETON: Multiple hypermetabolic bone metastases are seen T3 vertebral body, right-sided lamina of L3, the right ilium, and right ischial tuberosity. Incidental CT findings:  None. IMPRESSION: Hypermetabolic 2.3 cm left lower lobe pulmonary nodule, with other sub-cm bilateral pulmonary nodules also showing FDG uptake highly suspicious for pulmonary metastases. Mild hypermetabolic left hilar and mediastinal lymphadenopathy, consistent with metastatic disease. Single normal size right axillary lymph node is hypermetabolic, which is indeterminate. Stable small left pleural effusion, without focal hypermetabolic activity. Several hypermetabolic bone metastases in the spine and pelvis. 2 cm hypermetabolic right thyroid lobe nodule. Malignancy cannot be excluded. Recommend ultrasound and biopsy of the sonographically detected nodule. Reference: J Am Coll Radiol. 2015 Feb;12(2): 143-50 Aortic Atherosclerosis (ICD10-I70.0) and Emphysema (ICD10-J43.9). Electronically Signed   By: John A Stahl M.D.   On: 11/09/2021 14:12   CT BONE TROCAR/NEEDLE BIOPSY DEEP  Result Date: 11/17/2021 INDICATION: History of breast cancer, now with hypermetabolic  right acetabular lesion worrisome for metastatic disease. Please perform CT-guided biopsy for tissue diagnostic purposes Note, patient also with known hypermetabolic left lower lobe pulmonary nodule however after discussion with referring oncologist, Dr. Feng, the decision was made to proceed with CT-guided biopsy of right acetabular lesion with specific instructions to pathology to not de-calcify the sample for potential future molecular testing. EXAM: CT-GUIDED BONE LESION BIOPSY MEDICATIONS: None ANESTHESIA/SEDATION: Moderate (conscious) sedation was employed during this procedure as administered by the Interventional Radiology RN. A total of Versed 2 mg and Fentanyl 50 mcg was administered intravenously. Moderate Sedation Time: 23 minutes. The patient's level of consciousness and vital signs were monitored continuously by radiology nursing throughout the procedure under my direct supervision. COMPLICATIONS: None immediate. PROCEDURE: Informed consent was obtained from the patient following an explanation of the procedure, risks, benefits and alternatives. The patient understands, agrees and consents for the procedure. All questions were addressed. A time out was performed prior to the initiation of the procedure. The patient was positioned supine and non-contrast localization CT was performed of the pelvis to demonstrate the known slightly ill-defined approximately 2.5 x 1.7 cm lytic lesion involving the periacetabular region of the right ilium (image 20, series 2). The operative site was prepped and draped in the usual sterile fashion. Under sterile conditions and local anesthesia, a 22 gauge spinal needle was utilized for procedural planning. Next, an 11 gauge coaxial bone biopsy needle was advanced adjacent to the right periacetabular lesion. Appropriate position was confirmed with CT imaging (image 8, series 5), next, the inner, 13 gauge bone biopsy device was utilized to acquire sample (image 8, series 6).  Next, an additional sample was obtained with the outer 11 gauge bone biopsy needle This identical procedure was repeated yielding 5 additional core needle biopsies. Samples were submitted to pathology in saline with   specific instructions not de-calcify the samples for potential future molecular testing. Postprocedural imaging was obtained and the procedure was terminated. A dressing was applied. The patient tolerated the procedure well without immediate post procedural complication. IMPRESSION: Technically successful CT-guided biopsy of right periacetabular iliac lesion. Electronically Signed   By: Sandi Mariscal M.D.   On: 11/17/2021 11:57    ASSESSMENT: This is a very pleasant 87 years old white female with history of a stage Ia (T1b, N0, M0) ER positive, PR positive and HER2 negative left breast carcinoma diagnosed in August 2021 status post left breast lumpectomy followed by radioactive seed localization and adjuvant radiotherapy and currently on tamoxifen. The patient is recently diagnosed with a stage IV (T1c, N3, M1C) non-small cell lung cancer, adenocarcinoma presented with left lower lobe lung nodule in addition to bilateral mediastinal lymphadenopathy as well as bilateral pulmonary nodules and multiple metastatic bone lesions diagnosed in May 2023.   PLAN: I had a lengthy discussion with the patient and her husband today about her current disease stage, prognosis and treatment options. I explained to the patient that she has incurable condition and all the treatment will be of palliative nature. I also discussed with the patient her prognosis with and without treatment. I recommended for her to complete the staging work-up by ordering MRI of the brain to rule out brain metastasis. I will also send her tissue biopsy to [facility] for molecular studies and PD-L1 expression. We will also send blood test to [facility] for molecular studies today. I explained to the patient that if she has an  actionable mutation, she will be treated with targeted therapy, otherwise her treatment will consist from either immunotherapy monotherapy or a combination with chemotherapy depending on the PD-L1 expression.  She was also given the option of palliative care and hospice referral but she is interested in treatment. I will refer the patient to radiation oncology for consideration of palliative radiotherapy to the painful metastatic bone lesion.  She was seen by Dr. Lisbeth Renshaw in the past for her breast cancer. I will see the patient back for follow-up visit in around 2 weeks for evaluation and discussion of her treatment options based on the molecular studies and the staging work-up. She was advised to call immediately if she has any other concerning symptoms in the interval. The patient voices understanding of current disease status and treatment options and is in agreement with the current care plan.  All questions were answered. The patient knows to call the clinic with any problems, questions or concerns. We can certainly see the patient much sooner if necessary.  Thank you so much for allowing me to participate in the care of Sheila Shields. I will continue to follow up the patient with you and assist in her care.  The total time spent in the appointment was 90 minutes.  Disclaimer: This note was dictated with voice recognition software. Similar sounding words can inadvertently be transcribed and may not be corrected upon review.   Sheila Shields Dec 02, 2021, 9:04 AM

## 2021-12-02 NOTE — Progress Notes (Signed)
Oncology Nurse Navigator Documentation     12/02/2021    7:00 AM 11/29/2021    1:00 PM 11/29/2021   12:00 PM 05/25/2020    5:00 PM 04/26/2020    4:00 PM 03/29/2020   11:00 AM 03/03/2020    3:00 PM  Oncology Nurse Navigator Flowsheets  Phase of Treatment    Radiation Radiation  Surgery  Radiation Actual Start Date:     04/29/2020    Radiation Actual End Date:    05/26/2020     Surgery Actual Start Date:       03/23/2020  Navigator Follow Up Date:     05/26/2020 04/26/2020 03/29/2020  Navigator Follow Up Reason:     Adjuvant Radiation Appointment Review Pathology  Navigation Complete Date:    05/25/2020     Post Navigation: Continue to Follow Patient?    No     Navigator Location [facility]  Referral Date to RadOnc/MedOnc   11/29/2021      Navigator Encounter Type Pathology Review Telephone Telephone Appt/Treatment Plan Review Appt/Treatment Plan Review Pathology Review Appt/Treatment Plan Review  Telephone  Incoming Call Outgoing Call      Treatment Initiated Date    03/23/2020  03/23/2020   Patient Visit Type Other   RadOnc     Treatment Phase    Treatment     Barriers/Navigation Needs Coordination of Care/I notified pathology to send molecular and PDL 1 testing on recent bx per Dr. Julien Nordmann.  Education;Coordination of Care Coordination of Care;Education      Education  Other Other      Interventions Coordination of Care Education;Coordination of Care Coordination of Care;Education      Acuity Level 2-Minimal Needs (1-2 Barriers Identified) Level 2-Minimal Needs (1-2 Barriers Identified) Level 2-Minimal Needs (1-2 Barriers Identified)      Coordination of Care Pathology Appts       Education Method  Verbal Verbal      Time Spent with Patient 30 15 15 15 15 15  15

## 2021-12-07 ENCOUNTER — Encounter: Payer: Self-pay | Admitting: *Deleted

## 2021-12-07 NOTE — Progress Notes (Signed)
Oncology Nurse Navigator Documentation     12/07/2021   12:00 PM 12/02/2021    7:00 AM 11/29/2021    1:00 PM 11/29/2021   12:00 PM 05/25/2020    5:00 PM 04/26/2020    4:00 PM 03/29/2020   11:00 AM  Oncology Nurse Navigator Flowsheets  Phase of Treatment     Radiation Radiation   Radiation Actual Start Date:      04/29/2020   Radiation Actual End Date:     05/26/2020    Navigator Follow Up Date:      05/26/2020 04/26/2020  Navigator Follow Up Reason:      Adjuvant Radiation Appointment Review  Navigation Complete Date:     05/25/2020    Post Navigation: Continue to Follow Patient?     No    Navigator Location [facility]  Referral Date to RadOnc/MedOnc    11/29/2021     Navigator Encounter Type Pathology Review Pathology Review Telephone Telephone Appt/Treatment Plan Review Appt/Treatment Plan Review Pathology Review  Telephone   Incoming Call Outgoing Call     Treatment Initiated Date     03/23/2020  03/23/2020  Patient Visit Type Other Other   RadOnc    Treatment Phase     Treatment    Barriers/Navigation Needs Coordination of Care Coordination of Care Education;Coordination of Care Coordination of Care;Education     Education   Other Other     Interventions Coordination of Care/I received an update that pathology sent moleculars on 12/01/21 and are currently at [facility] in process. Patient has a follow up with med onc on 12/21/21.  Coordination of Care Education;Coordination of Care Coordination of Care;Education     Acuity Level 2-Minimal Needs (1-2 Barriers Identified) Level 2-Minimal Needs (1-2 Barriers Identified) Level 2-Minimal Needs (1-2 Barriers Identified) Level 2-Minimal Needs (1-2 Barriers Identified)     Coordination of Care Pathology Pathology Appts      Education Method   Verbal Verbal     Time Spent with Patient 30 30 15 15 15 15  15

## 2021-12-08 DIAGNOSIS — C50412 Malignant neoplasm of upper-outer quadrant of left female breast: Secondary | ICD-10-CM | POA: Diagnosis not present

## 2021-12-09 ENCOUNTER — Ambulatory Visit (HOSPITAL_COMMUNITY): Payer: Medicare HMO

## 2021-12-09 ENCOUNTER — Encounter (HOSPITAL_COMMUNITY): Payer: Self-pay

## 2021-12-09 ENCOUNTER — Telehealth: Payer: Self-pay | Admitting: Physician Assistant

## 2021-12-09 NOTE — Telephone Encounter (Signed)
Scheduled per 05/26 los, patient has been called and notified.

## 2021-12-12 DIAGNOSIS — C50411 Malignant neoplasm of upper-outer quadrant of right female breast: Secondary | ICD-10-CM | POA: Diagnosis not present

## 2021-12-12 LAB — GUARDANT 360

## 2021-12-13 ENCOUNTER — Ambulatory Visit (HOSPITAL_COMMUNITY): Payer: Medicare HMO

## 2021-12-13 ENCOUNTER — Encounter (HOSPITAL_COMMUNITY): Payer: Self-pay

## 2021-12-14 ENCOUNTER — Encounter: Payer: Self-pay | Admitting: *Deleted

## 2021-12-14 NOTE — Progress Notes (Signed)
Oncology Nurse Navigator Documentation     12/14/2021    1:00 PM 12/07/2021   12:00 PM 12/02/2021    7:00 AM 11/29/2021    1:00 PM 11/29/2021   12:00 PM 05/25/2020    5:00 PM 04/26/2020    4:00 PM  Oncology Nurse Navigator Flowsheets  Phase of Treatment      Radiation Radiation  Radiation Actual Start Date:       04/29/2020  Radiation Actual End Date:      05/26/2020   Navigator Follow Up Date:       05/26/2020  Navigator Follow Up Reason:       Adjuvant Radiation  Navigation Complete Date:      05/25/2020   Post Navigation: Continue to Follow Patient?      No   Nav[facility] Location [facility]  Referral Date to RadOnc/MedOnc     11/29/2021    Navigator Encounter Type Molecular Studies Pathology Review Pathology Review Telephone Telephone Appt/Treatment Plan Review Appt/Treatment Plan Review  Telephone    Incoming Call Outgoing Call    Treatment Initiated Date      03/23/2020   Patient Visit Type  Other Other   RadOnc   Treatment Phase Pre-Tx/Tx Discussion     Treatment   Barriers/Navigation Needs Coordination of Care/I followed up on Ms. Loor molecular and PDL 1 test results. They are completed. I printed and will place on Dr. Worthy Flank desk. Patient has a follow up with med onc next week.  Coordination of Care Coordination of Care Education;Coordination of Care Coordination of Care;Education    Education    Other Other    Interventions Coordination of Care Coordination of Care Coordination of Care Education;Coordination of Care Coordination of Care;Education    Acuity Level 2-Minimal Needs (1-2 Barriers Identified) Level 2-Minimal Needs (1-2 Barriers Identified) Level 2-Minimal Needs (1-2 Barriers Identified) Level 2-Minimal Needs (1-2 Barriers Identified) Level 2-Minimal Needs (1-2 Barriers Identified)    Coordination of Care Pathology Pathology Pathology Appts     Education Method     Verbal Verbal    Time Spent with Patient 30 30 30 15 15 15  15

## 2021-12-15 NOTE — Progress Notes (Unsigned)
[facility] OFFICE PROGRESS NOTE  Marin Olp, MD [place] [address]  DIAGNOSIS: ***  PRIOR THERAPY:  CURRENT THERAPY:  INTERVAL HISTORY: Sheila Shields 87 y.o. female returns for *** regular *** visit for followup of ***   MEDICAL HISTORY: Past Medical History:  Diagnosis Date   Alcoholism in remission ([place]) 08/27/2007   Anemia    Arthritis    OA   Breast cancer ([place])    left breast   COPD 12/17/2006   Diverticulosis 03/27/2007   GERD 05/01/2007   HYPERGLYCEMIA 08/27/2007   HYPERLIPIDEMIA 12/17/2006   HYPERTENSION 12/17/2006   Mood disorder ([place])    hx anxiety and depression. meds short term during life transition   Pneumonia    AS A CHILD   Rectal polyp 03/27/2007   adenoma   SOB (shortness of breath) on exertion    per patient due to having COPD   TIA (transient ischemic attack)     ALLERGIES:  is allergic to alcohol, cheese, codeine, and dilaudid [hydromorphone hcl].  MEDICATIONS:  Current Outpatient Medications  Medication Sig Dispense Refill   acetaminophen (TYLENOL) 650 MG CR tablet Take 1,300 mg by mouth 2 (two) times daily.     albuterol (PROAIR HFA) 108 (90 Base) MCG/ACT inhaler INHALE 2 PUFFS EVERY 4 HOURS AS NEEDED FOR COUGHING SPELLS (Patient not taking: Reported on 12/02/2021) 18 g 5   aspirin 325 MG tablet Take 1 tablet (325 mg total) by mouth daily. 30 tablet 3   azelastine (ASTELIN) 0.1 % nasal spray Place 2 sprays into both nostrils 2 (two) times daily. Use in each nostril as directed 30 mL 12   benzonatate (TESSALON) 200 MG capsule Take 1 capsule (200 mg total) by mouth 3 (three) times daily as needed for cough. (Patient not taking: Reported on 12/02/2021) 30 capsule 1   Cholecalciferol (VITAMIN D) 50 MCG (2000 UT) tablet Take 2,000 Units by mouth daily.     clopidogrel (PLAVIX) 75 MG tablet Take 0.5 tablets (37.5 mg total) by mouth daily. 46 tablet 3   Coenzyme Q10 300 MG CAPS Take 300 mg by mouth daily.      fexofenadine (ALLEGRA) 180 MG tablet Take 180 mg by mouth daily.     fluticasone (FLONASE) 50 MCG/ACT nasal spray Place 2 sprays into both nostrils daily. 16 g 2   Glycopyrrolate-Formoterol (BEVESPI AEROSPHERE) 9-4.8 MCG/ACT AERO Inhale 2 puffs into the lungs 2 (two) times daily. 10.7 g 5   hydrochlorothiazide (HYDRODIURIL) 25 MG tablet Take 0.5 tablets (12.5 mg total) by mouth daily. 46 tablet 3   montelukast (SINGULAIR) 10 MG tablet Take 1 tablet (10 mg total) by mouth at bedtime. 30 tablet 5   Multiple Vitamin (MULTIVITAMIN WITH MINERALS) TABS tablet Take 1 tablet by mouth daily.     pantoprazole (PROTONIX) 40 MG tablet Take 1 tablet (40 mg total) by mouth daily. 30 tablet 5   potassium chloride SA (KLOR-CON M20) 20 MEQ tablet Take 1 tablet (20 mEq total) by mouth every other day. 46 tablet 3   Propylene Glycol (SYSTANE BALANCE OP) Place 1 drop into both eyes daily.     rosuvastatin (CRESTOR) 20 MG tablet Take 1 tablet (20 mg total) by mouth daily. 90 tablet 3   tamoxifen (NOLVADEX) 20 MG tablet Take 1 tablet (20 mg total) by mouth daily. 90 tablet 3   traMADol (ULTRAM) 50 MG tablet TAKE ONE TABLET BY MOUTH EVERY 8 HOURS AS NEEDED FOR PAIN FOR  UP TO 5 DAYS (DO NOT DRIVE FOR 8 HOURS AFTER TAKING) 45 tablet 5   traZODone (DESYREL) 50 MG tablet TAKE 1 TABLET BY MOUTH EVERY NIGHT AT BEDTIME AS NEEDED FOR SLEEP (Patient taking differently: Take 50 mg by mouth at bedtime.) 90 tablet 3   vitamin B-12 (CYANOCOBALAMIN) 1000 MCG tablet Take 1,000 mcg by mouth daily.     No current facility-administered medications for this visit.    SURGICAL HISTORY:  Past Surgical History:  Procedure Laterality Date   ABDOMINAL HYSTERECTOMY  1978   fibroma   APPENDECTOMY  2002   BREAST EXCISIONAL BIOPSY Right 2003   negative   BREAST LUMPECTOMY WITH RADIOACTIVE SEED LOCALIZATION Left 03/23/2020   Procedure: LEFT BREAST LUMPECTOMY WITH RADIOACTIVE SEED LOCALIZATION;  Surgeon: [name], MD;  Location: [facility];   Service: General;  Laterality: Left;  RNFA   CARPAL TUNNEL RELEASE Left 2010 or 2011   CATARACT EXTRACTION W/ INTRAOCULAR LENS  IMPLANT, BILATERAL Bilateral    COLONOSCOPY W/ POLYPECTOMY  03/27/2007; n7/19/2012   2008: 4 mm adenoma, diverticulosis 2012: ileitis ? NSAID - likely, 2-3 mm cecal polyp LYMPHOID FOLLICLE, diverticulosis   ESOPHAGOGASTRODUODENOSCOPY  01/26/2011   reflux esophagitis, colonscopy done also   HAMMER TOE SURGERY  2008   left foot   HERNIA REPAIR Right 1996?   IR ANGIO INTRA EXTRACRAN SEL COM CAROTID INNOMINATE BILAT MOD SED  10/25/2021   IR ANGIO INTRA EXTRACRAN SEL COM CAROTID INNOMINATE UNI R MOD SED  11/28/2018   IR ANGIO INTRA EXTRACRAN SEL COM CAROTID INNOMINATE UNI R MOD SED  03/17/2021   IR ANGIO INTRA EXTRACRAN SEL INTERNAL CAROTID UNI R MOD SED  12/30/2018   IR ANGIO VERTEBRAL SEL SUBCLAVIAN INNOMINATE UNI R MOD SED  11/28/2018   IR ANGIO VERTEBRAL SEL SUBCLAVIAN INNOMINATE UNI R MOD SED  10/26/2021   IR ANGIO VERTEBRAL SEL VERTEBRAL UNI R MOD SED  03/18/2021   IR ANGIOGRAM FOLLOW UP STUDY  12/30/2018   IR TRANSCATH/EMBOLIZ  12/30/2018   IR US GUIDE VASC ACCESS RIGHT  11/28/2018   IR US GUIDE VASC ACCESS RIGHT  03/17/2021   ORIF ANKLE FRACTURE Right 12/22/2014   Procedure: OPEN REDUCTION INTERNAL FIXATION (ORIF) ANKLE FRACTURE;  Surgeon: Susa Day, MD;  Location: [facility];  Service: Orthopedics;  Laterality: Right;   RADIOLOGY WITH ANESTHESIA N/A 12/30/2018   Procedure: RADIOLOGY WITH ANESTHESIA;  Surgeon: Luanne Bras, MD;  Location: [facility];  Service: Radiology;  Laterality: N/A;   SHOULDER OPEN ROTATOR CUFF REPAIR Left 03/06/2013   Procedure: LEFT ROTATOR CUFF REPAIR, SUBACROMIAL DECOMPRESSION, PATCH GRAFT, MANIPULATION UNDER ANESTHESIA;  Surgeon: Johnn Hai, MD;  Location: [facility];  Service: Orthopedics;  Laterality: Left;   TONSILLECTOMY      REVIEW OF SYSTEMS:   Review of Systems  Constitutional: Negative for appetite change, chills, fatigue, fever and  unexpected weight change.  HENT:   Negative for mouth sores, nosebleeds, sore throat and trouble swallowing.   Eyes: Negative for eye problems and icterus.  Respiratory: Negative for cough, hemoptysis, shortness of breath and wheezing.   Cardiovascular: Negative for chest pain and leg swelling.  Gastrointestinal: Negative for abdominal pain, constipation, diarrhea, nausea and vomiting.  Genitourinary: Negative for bladder incontinence, difficulty urinating, dysuria, frequency and hematuria.   Musculoskeletal: Negative for back pain, gait problem, neck pain and neck stiffness.  Skin: Negative for itching and rash.  Neurological: Negative for dizziness, extremity weakness, gait problem, headaches, light-headedness and seizures.  Hematological: Negative for adenopathy. Does  not bruise/bleed easily.  Psychiatric/Behavioral: Negative for confusion, depression and sleep disturbance. The patient is not nervous/anxious.     PHYSICAL EXAMINATION:  There were no vitals taken for this visit.  ECOG PERFORMANCE STATUS: {CHL ONC ECOG [id number]  Physical Exam  Constitutional: Oriented to person, place, and time and well-developed, well-nourished, and in no distress. No distress.  HENT:  Head: Normocephalic and atraumatic.  Mouth/Throat: Oropharynx is clear and moist. No oropharyngeal exudate.  Eyes: Conjunctivae are normal. Right eye exhibits no discharge. Left eye exhibits no discharge. No scleral icterus.  Neck: Normal range of motion. Neck supple.  Cardiovascular: Normal rate, regular rhythm, normal heart sounds and intact distal pulses.   Pulmonary/Chest: Effort normal and breath sounds normal. No respiratory distress. No wheezes. No rales.  Abdominal: Soft. Bowel sounds are normal. Exhibits no distension and no mass. There is no tenderness.  Musculoskeletal: Normal range of motion. Exhibits no edema.  Lymphadenopathy:    No cervical adenopathy.  Neurological: Alert and oriented to person,  place, and time. Exhibits normal muscle tone. Gait normal. Coordination normal.  Skin: Skin is warm and dry. No rash noted. Not diaphoretic. No erythema. No pallor.  Psychiatric: Mood, memory and judgment normal.  Vitals reviewed.  LABORATORY DATA: Lab Results  Component Value Date   WBC 6.4 12/02/2021   HGB 13.6 12/02/2021   HCT 40.4 12/02/2021   MCV 93.5 12/02/2021   PLT 210 12/02/2021      Chemistry      Component Value Date/Time   NA 142 12/02/2021 0843   K 3.7 12/02/2021 0843   CL 105 12/02/2021 0843   CO2 32 12/02/2021 0843   BUN 14 12/02/2021 0843   CREATININE 0.83 12/02/2021 0843   CREATININE 0.76 05/24/2020 1414      Component Value Date/Time   CALCIUM 9.7 12/02/2021 0843   ALKPHOS 58 12/02/2021 0843   AST 16 12/02/2021 0843   ALT 10 12/02/2021 0843   BILITOT 0.5 12/02/2021 0843       RADIOGRAPHIC STUDIES:  CT BONE TROCAR/NEEDLE BIOPSY DEEP  Result Date: 11/17/2021 INDICATION: History of breast cancer, now with hypermetabolic right acetabular lesion worrisome for metastatic disease. Please perform CT-guided biopsy for tissue diagnostic purposes Note, patient also with known hypermetabolic left lower lobe pulmonary nodule however after discussion with referring oncologist, Dr. Burr Medico, the decision was made to proceed with CT-guided biopsy of right acetabular lesion with specific instructions to pathology to not de-calcify the sample for potential future molecular testing. EXAM: CT-GUIDED BONE LESION BIOPSY MEDICATIONS: None ANESTHESIA/SEDATION: Moderate (conscious) sedation was employed during this procedure as administered by the Interventional Radiology RN. A total of Versed 2 mg and Fentanyl 50 mcg was administered intravenously. Moderate Sedation Time: 23 minutes. The patient's level of consciousness and vital signs were monitored continuously by radiology nursing throughout the procedure under my direct supervision. COMPLICATIONS: None immediate. PROCEDURE: Informed  consent was obtained from the patient following an explanation of the procedure, risks, benefits and alternatives. The patient understands, agrees and consents for the procedure. All questions were addressed. A time out was performed prior to the initiation of the procedure. The patient was positioned supine and non-contrast localization CT was performed of the pelvis to demonstrate the known slightly ill-defined approximately 2.5 x 1.7 cm lytic lesion involving the periacetabular region of the right ilium (image 20, series 2). The operative site was prepped and draped in the usual sterile fashion. Under sterile conditions and local anesthesia, a 22 gauge spinal needle was utilized  for procedural planning. Next, an 11 gauge coaxial bone biopsy needle was advanced adjacent to the right periacetabular lesion. Appropriate position was confirmed with CT imaging (image 8, series 5), next, the inner, 13 gauge bone biopsy device was utilized to acquire sample (image 8, series 6). Next, an additional sample was obtained with the outer 11 gauge bone biopsy needle This identical procedure was repeated yielding 5 additional core needle biopsies. Samples were submitted to pathology in saline with specific instructions not de-calcify the samples for potential future molecular testing. Postprocedural imaging was obtained and the procedure was terminated. A dressing was applied. The patient tolerated the procedure well without immediate post procedural complication. IMPRESSION: Technically successful CT-guided biopsy of right periacetabular iliac lesion. Electronically Signed   By: Sandi Mariscal M.D.   On: 11/17/2021 11:57     ASSESSMENT/PLAN:  No problem-specific Assessment & Plan notes found for this encounter.   No orders of the defined types were placed in this encounter.    I spent {CHL ONC TIME VISIT - VGKKD:[id number]} counseling the patient face to face. The total time spent in the appointment was {CHL ONC TIME  VISIT - IDUPB:[id number]}.  Jakota Manthei L Marqueze Ramcharan, PA-C 12/15/21

## 2021-12-16 ENCOUNTER — Ambulatory Visit (HOSPITAL_BASED_OUTPATIENT_CLINIC_OR_DEPARTMENT_OTHER)
Admission: RE | Admit: 2021-12-16 | Discharge: 2021-12-16 | Disposition: A | Discharge status: Home / Self Care | Payer: Medicare HMO | Source: Ambulatory Visit | Attending: Internal Medicine | Admitting: Internal Medicine

## 2021-12-16 DIAGNOSIS — C3490 Malignant neoplasm of unspecified part of unspecified bronchus or lung: Secondary | ICD-10-CM

## 2021-12-16 DIAGNOSIS — G319 Degenerative disease of nervous system, unspecified: Secondary | ICD-10-CM | POA: Diagnosis not present

## 2021-12-16 MED ORDER — GADOBUTROL 1 MMOL/ML IV SOLN
7.0000 mL | Freq: Once | INTRAVENOUS | Status: AC | PRN
  Administered 2021-12-16: 7 mL via INTRAVENOUS
  Filled 2021-12-16: qty 7.5

## 2021-12-19 NOTE — Progress Notes (Signed)
Radiation Oncology         [phone]) [phone] ________________________________  Name: Sheila Shields        MRN: [id number]  Date of Service: 12/20/2021 DOB: December 20, 1934  EL:FYBOFB, Brayton Mars, MD  Curt Bears, MD     REFERRING PHYSICIAN: Curt Bears, MD   DIAGNOSIS: The encounter diagnosis was Malignant neoplasm of lower lobe of left lung [facility]).   HISTORY OF PRESENT ILLNESS: Sheila Shields is a 87 y.o. female seen at the request of Dr. Julien Nordmann for a diagnosis of stage IV lung cancer. She has a history of Stage IA, pT1bN0M0 grade 2 ER/PR positive invasive lobular carcinoma of the left breast for which she was treated with lumpectomy, adjuvant radiation and antiestrogen. She noted cough and shortness of breath and a chest x-ray in April of this year showed no acute process.  It was followed by a CT angiogram of the chest on 10/28/2021 the same day showing no evidence of PE but emphysematous lung disease and a 2.5 cm left lower lobe lung mass consistent with underlying neoplasm, multiple bilateral subcentimeter lung nodules with pulmonary metastasis and a 3.5 cm soft tissue mass in the left breast with adjacent surgical clips.  There were low-attenuation liver lesions suspicious for cysts, and a PET on 11/09/2021 showed hypermetabolism in the 2.3 cm left lower lobe nodule as well as other bilateral pulmonary nodules concerning for metastatic disease.  She had hypermetabolic left hilar and mediastinal adenopathy and a single normal-sized right axillary node that was hypermetabolic and felt to be indeterminate.  She also had hypermetabolism in bones of the spine and pelvis and a 2 cm hypermetabolic right thyroid lobe nodule.  CT bone biopsy of the right acetabular/iliac lesion on 11/17/2021 showed metastatic carcinoma consistent with lung adenocarcinoma.  She is having molecular testing done as well as an MRI that has been ordered of the brain, this was performed on 12/16/2021 and showed no evidence of disease.   She is seen to consider palliative radiotherapy in her right iliac/acetabular lesion.    PREVIOUS RADIATION THERAPY:     04/29/20 - 05/26/20: The patient initially received a dose of 42.56 Gy in 16 fractions to the breast using whole-breast tangent fields. This was delivered using a 3-D conformal technique. The patient then received a boost to the seroma. This delivered an additional 8 Gy in 4 fractions using a 3 field photon technique due to the depth of the seroma. The total dose was 50.56 Gy.  PAST MEDICAL HISTORY:  Past Medical History:  Diagnosis Date   Alcoholism in remission ([place]) 08/27/2007   Anemia    Arthritis    OA   Breast cancer ([place])    left breast   COPD 12/17/2006   Diverticulosis 03/27/2007   GERD 05/01/2007   HYPERGLYCEMIA 08/27/2007   HYPERLIPIDEMIA 12/17/2006   HYPERTENSION 12/17/2006   Mood disorder ([place])    hx anxiety and depression. meds short term during life transition   Pneumonia    AS A CHILD   Rectal polyp 03/27/2007   adenoma   SOB (shortness of breath) on exertion    per patient due to having COPD   TIA (transient ischemic attack)        PAST SURGICAL HISTORY: Past Surgical History:  Procedure Laterality Date   ABDOMINAL HYSTERECTOMY  1978   fibroma   APPENDECTOMY  2002   BREAST EXCISIONAL BIOPSY Right 2003   negative   BREAST LUMPECTOMY WITH RADIOACTIVE SEED LOCALIZATION Left 03/23/2020  Procedure: LEFT BREAST LUMPECTOMY WITH RADIOACTIVE SEED LOCALIZATION;  Surgeon: [name], MD;  Location: [facility];  Service: General;  Laterality: Left;  RNFA   CARPAL TUNNEL RELEASE Left 2010 or 2011   CATARACT EXTRACTION W/ INTRAOCULAR LENS  IMPLANT, BILATERAL Bilateral    COLONOSCOPY W/ POLYPECTOMY  03/27/2007; n7/19/2012   2008: 4 mm adenoma, diverticulosis 2012: ileitis ? NSAID - likely, 2-3 mm cecal polyp LYMPHOID FOLLICLE, diverticulosis   ESOPHAGOGASTRODUODENOSCOPY  01/26/2011   reflux esophagitis, colonscopy done also   HAMMER TOE SURGERY  2008    left foot   HERNIA REPAIR Right 1996?   IR ANGIO INTRA EXTRACRAN SEL COM CAROTID INNOMINATE BILAT MOD SED  10/25/2021   IR ANGIO INTRA EXTRACRAN SEL COM CAROTID INNOMINATE UNI R MOD SED  11/28/2018   IR ANGIO INTRA EXTRACRAN SEL COM CAROTID INNOMINATE UNI R MOD SED  03/17/2021   IR ANGIO INTRA EXTRACRAN SEL INTERNAL CAROTID UNI R MOD SED  12/30/2018   IR ANGIO VERTEBRAL SEL SUBCLAVIAN INNOMINATE UNI R MOD SED  11/28/2018   IR ANGIO VERTEBRAL SEL SUBCLAVIAN INNOMINATE UNI R MOD SED  10/26/2021   IR ANGIO VERTEBRAL SEL VERTEBRAL UNI R MOD SED  03/18/2021   IR ANGIOGRAM FOLLOW UP STUDY  12/30/2018   IR TRANSCATH/EMBOLIZ  12/30/2018   IR US GUIDE VASC ACCESS RIGHT  11/28/2018   IR US GUIDE VASC ACCESS RIGHT  03/17/2021   ORIF ANKLE FRACTURE Right 12/22/2014   Procedure: OPEN REDUCTION INTERNAL FIXATION (ORIF) ANKLE FRACTURE;  Surgeon: Susa Day, MD;  Location: [facility];  Service: Orthopedics;  Laterality: Right;   RADIOLOGY WITH ANESTHESIA N/A 12/30/2018   Procedure: RADIOLOGY WITH ANESTHESIA;  Surgeon: Luanne Bras, MD;  Location: [facility];  Service: Radiology;  Laterality: N/A;   SHOULDER OPEN ROTATOR CUFF REPAIR Left 03/06/2013   Procedure: LEFT ROTATOR CUFF REPAIR, SUBACROMIAL DECOMPRESSION, PATCH GRAFT, MANIPULATION UNDER ANESTHESIA;  Surgeon: Johnn Hai, MD;  Location: [facility];  Service: Orthopedics;  Laterality: Left;   TONSILLECTOMY       FAMILY HISTORY:  Family History  Problem Relation Age of Onset   Throat cancer Mother        21   Heart attack Father 28   Idiopathic pulmonary fibrosis Brother 42   Cancer Brother        sarcoma   Heart disease Brother    Colon cancer Neg Hx    Esophageal cancer Neg Hx    Stomach cancer Neg Hx      SOCIAL HISTORY:  reports that she quit smoking about 37 years ago. Her smoking use included cigarettes. She has a 60.00 pack-year smoking history. She has never used smokeless tobacco. She reports that she does not drink alcohol and does not use  drugs.  The patient is married and lives in [place].  She is accompanied by her husband.   ALLERGIES: Alcohol, Cheese, Codeine, and Dilaudid [hydromorphone hcl]   MEDICATIONS:  Current Outpatient Medications  Medication Sig Dispense Refill   acetaminophen (TYLENOL) 650 MG CR tablet Take 1,300 mg by mouth 2 (two) times daily.     albuterol (PROAIR HFA) 108 (90 Base) MCG/ACT inhaler INHALE 2 PUFFS EVERY 4 HOURS AS NEEDED FOR COUGHING SPELLS (Patient not taking: Reported on 12/02/2021) 18 g 5   aspirin 325 MG tablet Take 1 tablet (325 mg total) by mouth daily. 30 tablet 3   azelastine (ASTELIN) 0.1 % nasal spray Place 2 sprays into both nostrils 2 (two) times daily. Use in each  nostril as directed 30 mL 12   benzonatate (TESSALON) 200 MG capsule Take 1 capsule (200 mg total) by mouth 3 (three) times daily as needed for cough. (Patient not taking: Reported on 12/02/2021) 30 capsule 1   Cholecalciferol (VITAMIN D) 50 MCG (2000 UT) tablet Take 2,000 Units by mouth daily.     clopidogrel (PLAVIX) 75 MG tablet Take 0.5 tablets (37.5 mg total) by mouth daily. 46 tablet 3   Coenzyme Q10 300 MG CAPS Take 300 mg by mouth daily.     fexofenadine (ALLEGRA) 180 MG tablet Take 180 mg by mouth daily.     fluticasone (FLONASE) 50 MCG/ACT nasal spray Place 2 sprays into both nostrils daily. 16 g 2   Glycopyrrolate-Formoterol (BEVESPI AEROSPHERE) 9-4.8 MCG/ACT AERO Inhale 2 puffs into the lungs 2 (two) times daily. 10.7 g 5   hydrochlorothiazide (HYDRODIURIL) 25 MG tablet Take 0.5 tablets (12.5 mg total) by mouth daily. 46 tablet 3   montelukast (SINGULAIR) 10 MG tablet Take 1 tablet (10 mg total) by mouth at bedtime. 30 tablet 5   Multiple Vitamin (MULTIVITAMIN WITH MINERALS) TABS tablet Take 1 tablet by mouth daily.     pantoprazole (PROTONIX) 40 MG tablet Take 1 tablet (40 mg total) by mouth daily. 30 tablet 5   potassium chloride SA (KLOR-CON M20) 20 MEQ tablet Take 1 tablet (20 mEq total) by mouth every  other day. 46 tablet 3   Propylene Glycol (SYSTANE BALANCE OP) Place 1 drop into both eyes daily.     rosuvastatin (CRESTOR) 20 MG tablet Take 1 tablet (20 mg total) by mouth daily. 90 tablet 3   tamoxifen (NOLVADEX) 20 MG tablet Take 1 tablet (20 mg total) by mouth daily. 90 tablet 3   traMADol (ULTRAM) 50 MG tablet TAKE ONE TABLET BY MOUTH EVERY 8 HOURS AS NEEDED FOR PAIN FOR UP TO 5 DAYS (DO NOT DRIVE FOR 8 HOURS AFTER TAKING) 45 tablet 5   traZODone (DESYREL) 50 MG tablet TAKE 1 TABLET BY MOUTH EVERY NIGHT AT BEDTIME AS NEEDED FOR SLEEP (Patient taking differently: Take 50 mg by mouth at bedtime.) 90 tablet 3   vitamin B-12 (CYANOCOBALAMIN) 1000 MCG tablet Take 1,000 mcg by mouth daily.     No current facility-administered medications for this encounter.     REVIEW OF SYSTEMS: On review of systems, the patient reports that she is doing pretty well and managing pain but does note this in her right hip extending from the low back as well into the right hip and down her right lateral thigh. She also describes pain in the posterior chest wall just below her shoulder blade region. She denies any numbness or tingling or loss of sensory perception. No other complaints of shortness of breath, progressive cough or hemoptysis are noted.      PHYSICAL EXAM:  Wt Readings from Last 3 Encounters:  12/20/21 170 lb (77.1 kg)  12/02/21 170 lb 11.2 oz (77.4 kg)  11/29/21 168 lb (76.2 kg)   Temp Readings from Last 3 Encounters:  12/20/21 (!) 97.5 F (36.4 C) (Temporal)  12/02/21 97.7 F (36.5 C) (Axillary)  11/29/21 97.8 F (36.6 C) (Temporal)   BP Readings from Last 3 Encounters:  12/20/21 127/61  12/02/21 (!) 110/51  11/29/21 130/60   Pulse Readings from Last 3 Encounters:  12/20/21 87  12/02/21 79  11/29/21 84   Pain Assessment Pain Score: 0-No pain/10  In general this is a well appearing caucasian female who appears younger than her  stated age in no acute distress. She's alert and  oriented x4 and appropriate throughout the examination. Cardiopulmonary assessment is negative for acute distress and she exhibits normal effort.     ECOG = 1  0 - Asymptomatic (Fully active, able to carry on all predisease activities without restriction)  1 - Symptomatic but completely ambulatory (Restricted in physically strenuous activity but ambulatory and able to carry out work of a light or sedentary nature. For example, light housework, office work)  2 - Symptomatic, <50% in bed during the day (Ambulatory and capable of all self care but unable to carry out any work activities. Up and about more than 50% of waking hours)  3 - Symptomatic, >50% in bed, but not bedbound (Capable of only limited self-care, confined to bed or chair 50% or more of waking hours)  4 - Bedbound (Completely disabled. Cannot carry on any self-care. Totally confined to bed or chair)  5 - Death   Eustace Pen MM, Creech RH, Tormey DC, et al. [phone]). "Toxicity and response criteria of the [facility]". [place] Oncol. 5 (6): 649-55    LABORATORY DATA:  Lab Results  Component Value Date   WBC 6.4 12/02/2021   HGB 13.6 12/02/2021   HCT 40.4 12/02/2021   MCV 93.5 12/02/2021   PLT 210 12/02/2021   Lab Results  Component Value Date   NA 142 12/02/2021   K 3.7 12/02/2021   CL 105 12/02/2021   CO2 32 12/02/2021   Lab Results  Component Value Date   ALT 10 12/02/2021   AST 16 12/02/2021   ALKPHOS 58 12/02/2021   BILITOT 0.5 12/02/2021      RADIOGRAPHY: MR BRAIN W WO CONTRAST  Result Date: 12/18/2021 CLINICAL DATA:  Non-small cell lung cancer staging. Prior endovascular treatment of a cerebral aneurysm. EXAM: MRI HEAD WITHOUT AND WITH CONTRAST TECHNIQUE: Multiplanar, multiecho pulse sequences of the brain and surrounding structures were obtained without and with intravenous contrast. CONTRAST:  48mL GADAVIST GADOBUTROL 1 MMOL/ML IV SOLN COMPARISON:  Head MRI 07/27/2020 FINDINGS:  Brain: There is no evidence of an acute infarct, intracranial hemorrhage, mass, midline shift, or extra-axial fluid collection. There is mild generalized cerebral atrophy. Scattered small T2 hyperintensities in the cerebral white matter bilaterally are unchanged and nonspecific but compatible with mild chronic small vessel ischemic disease. No abnormal enhancement is identified. Vascular: Major intracranial vascular flow voids are preserved. Susceptibility artifact along the right supraclinoid ICA secondary to pipeline flow diverter devices for prior aneurysm treatment. Skull and upper cervical spine: Unremarkable bone marrow signal. Sinuses/Orbits: Bilateral cataract extraction. Paranasal sinuses and mastoid air cells are clear. Other: None. IMPRESSION: 1. No evidence of intracranial metastases. 2. Mild chronic small vessel ischemic disease. Electronically Signed   By: Logan Bores M.D.   On: 12/18/2021 14:32       IMPRESSION/PLAN: 1. Stage IV, NSCLC, adenocarcinoma of the LLL. Dr. Lisbeth Renshaw discusses the pathology findings and reviews the nature of metastatic lung cancer. He recommends a pallitiave course of raditoherapy to the right acetabular/iliac region.  We discussed the risks, benefits, short, and long term effects of radiotherapy, as well as the palliative intent, and the patient is interested in proceeding. Dr. Lisbeth Renshaw discusses the delivery and logistics of radiotherapy and anticipates a course of 3 weeks of radiotherapy to the right hip/pelvic region.and mid thoracic spine.  Written consent is obtained and placed in the chart, a copy was provided to the patient. The patient will be contacted to  coordinate treatment planning by our simulation department. Systemic therapy recommendations are pending her molecular testing. 2. Hypermetabolic thyroid nodule. This will be followed closely by Dr. Julien Nordmann as she proceeds with treatment of #1. 3. Stage IA, pT1bN0M0 grade 2 ER/PR positive invasive lobular  carcinoma of the left breast. She will continue Tamoxifen and follow along with either Dr. Burr Medico or Dr. Julien Nordmann.    In a visit lasting 45  minutes, greater than 50% of the time was spent face to face discussing the patient's condition, in preparation for the discussion, and coordinating the patient's care.    The above documentation reflects my direct findings during this shared patient visit. Please see the separate note by Dr. Lisbeth Renshaw on this date for the remainder of the patient's plan of care.    Carola Rhine, [facility]   **Disclaimer: This note was dictated with voice recognition software. Similar sounding words can inadvertently be transcribed and this note may contain transcription errors which may not have been corrected upon publication of note.**

## 2021-12-20 ENCOUNTER — Ambulatory Visit
Admission: RE | Admit: 2021-12-20 | Discharge: 2021-12-20 | Disposition: A | Discharge status: Home / Self Care | Payer: Medicare HMO | Source: Ambulatory Visit | Attending: Radiation Oncology | Admitting: Radiation Oncology

## 2021-12-20 ENCOUNTER — Encounter: Payer: Self-pay | Admitting: Radiation Oncology

## 2021-12-20 ENCOUNTER — Other Ambulatory Visit: Payer: Self-pay

## 2021-12-20 VITALS — BP 127/61 | HR 87 | Temp 97.5°F | Resp 20 | Ht 64.0 in | Wt 170.0 lb

## 2021-12-20 DIAGNOSIS — C50412 Malignant neoplasm of upper-outer quadrant of left female breast: Secondary | ICD-10-CM | POA: Diagnosis not present

## 2021-12-20 DIAGNOSIS — C3432 Malignant neoplasm of lower lobe, left bronchus or lung: Secondary | ICD-10-CM | POA: Diagnosis not present

## 2021-12-20 DIAGNOSIS — Z79899 Other long term (current) drug therapy: Secondary | ICD-10-CM | POA: Insufficient documentation

## 2021-12-20 DIAGNOSIS — F17210 Nicotine dependence, cigarettes, uncomplicated: Secondary | ICD-10-CM | POA: Diagnosis not present

## 2021-12-20 DIAGNOSIS — D381 Neoplasm of uncertain behavior of trachea, bronchus and lung: Secondary | ICD-10-CM

## 2021-12-20 DIAGNOSIS — E041 Nontoxic single thyroid nodule: Secondary | ICD-10-CM | POA: Diagnosis not present

## 2021-12-20 DIAGNOSIS — Z8673 Personal history of transient ischemic attack (TIA), and cerebral infarction without residual deficits: Secondary | ICD-10-CM | POA: Diagnosis not present

## 2021-12-20 DIAGNOSIS — Z170 Estrogen receptor positive status [ER+]: Secondary | ICD-10-CM | POA: Insufficient documentation

## 2021-12-20 DIAGNOSIS — I6782 Cerebral ischemia: Secondary | ICD-10-CM | POA: Diagnosis not present

## 2021-12-20 DIAGNOSIS — R1011 Right upper quadrant pain: Secondary | ICD-10-CM | POA: Diagnosis not present

## 2021-12-20 DIAGNOSIS — M129 Arthropathy, unspecified: Secondary | ICD-10-CM | POA: Insufficient documentation

## 2021-12-20 DIAGNOSIS — Z7982 Long term (current) use of aspirin: Secondary | ICD-10-CM | POA: Insufficient documentation

## 2021-12-20 DIAGNOSIS — Z86010 Personal history of colonic polyps: Secondary | ICD-10-CM | POA: Insufficient documentation

## 2021-12-20 DIAGNOSIS — J449 Chronic obstructive pulmonary disease, unspecified: Secondary | ICD-10-CM | POA: Insufficient documentation

## 2021-12-20 DIAGNOSIS — C7951 Secondary malignant neoplasm of bone: Secondary | ICD-10-CM | POA: Diagnosis not present

## 2021-12-20 DIAGNOSIS — Z801 Family history of malignant neoplasm of trachea, bronchus and lung: Secondary | ICD-10-CM | POA: Insufficient documentation

## 2021-12-20 DIAGNOSIS — Z79810 Long term (current) use of selective estrogen receptor modulators (SERMs): Secondary | ICD-10-CM | POA: Insufficient documentation

## 2021-12-20 DIAGNOSIS — K219 Gastro-esophageal reflux disease without esophagitis: Secondary | ICD-10-CM | POA: Diagnosis not present

## 2021-12-20 NOTE — Progress Notes (Signed)
Reconsult appointment. I verified patient's identity and began nursing interview w/ spouse Mr. Earney Mallet in attendance. Patient reports SOB w/ exertion and a mild cough. No other issues reported at this time.  Meaningful use complete.  BP 127/61 (BP Location: Right Arm, Patient Position: Sitting, Cuff Size: Normal)   Pulse 87   Temp (!) 97.5 F (36.4 C) (Temporal)   Resp 20   Ht 5\' 4"  (1.626 m)   Wt 170 lb (77.1 kg)   SpO2 91%   BMI 29.18 kg/m

## 2021-12-21 ENCOUNTER — Other Ambulatory Visit: Payer: Self-pay | Admitting: Internal Medicine

## 2021-12-21 ENCOUNTER — Encounter: Payer: Self-pay | Admitting: Physician Assistant

## 2021-12-21 ENCOUNTER — Other Ambulatory Visit: Payer: Self-pay

## 2021-12-21 ENCOUNTER — Inpatient Hospital Stay: Payer: Medicare HMO | Admitting: Physician Assistant

## 2021-12-21 ENCOUNTER — Inpatient Hospital Stay: Payer: Medicare HMO | Attending: Nurse Practitioner

## 2021-12-21 VITALS — BP 122/60 | HR 83 | Temp 97.7°F | Resp 18 | Ht 64.0 in | Wt 168.9 lb

## 2021-12-21 DIAGNOSIS — E538 Deficiency of other specified B group vitamins: Secondary | ICD-10-CM | POA: Insufficient documentation

## 2021-12-21 DIAGNOSIS — C50912 Malignant neoplasm of unspecified site of left female breast: Secondary | ICD-10-CM | POA: Diagnosis not present

## 2021-12-21 DIAGNOSIS — R7401 Elevation of levels of liver transaminase levels: Secondary | ICD-10-CM | POA: Insufficient documentation

## 2021-12-21 DIAGNOSIS — C50412 Malignant neoplasm of upper-outer quadrant of left female breast: Secondary | ICD-10-CM | POA: Diagnosis not present

## 2021-12-21 DIAGNOSIS — C3432 Malignant neoplasm of lower lobe, left bronchus or lung: Secondary | ICD-10-CM | POA: Diagnosis not present

## 2021-12-21 DIAGNOSIS — R918 Other nonspecific abnormal finding of lung field: Secondary | ICD-10-CM | POA: Diagnosis not present

## 2021-12-21 DIAGNOSIS — Z7189 Other specified counseling: Secondary | ICD-10-CM | POA: Diagnosis not present

## 2021-12-21 DIAGNOSIS — Z90710 Acquired absence of both cervix and uterus: Secondary | ICD-10-CM | POA: Diagnosis not present

## 2021-12-21 DIAGNOSIS — Z79810 Long term (current) use of selective estrogen receptor modulators (SERMs): Secondary | ICD-10-CM | POA: Insufficient documentation

## 2021-12-21 DIAGNOSIS — J449 Chronic obstructive pulmonary disease, unspecified: Secondary | ICD-10-CM | POA: Diagnosis not present

## 2021-12-21 DIAGNOSIS — Z7952 Long term (current) use of systemic steroids: Secondary | ICD-10-CM | POA: Diagnosis not present

## 2021-12-21 DIAGNOSIS — C3490 Malignant neoplasm of unspecified part of unspecified bronchus or lung: Secondary | ICD-10-CM

## 2021-12-21 DIAGNOSIS — I10 Essential (primary) hypertension: Secondary | ICD-10-CM | POA: Diagnosis not present

## 2021-12-21 DIAGNOSIS — Z5112 Encounter for antineoplastic immunotherapy: Secondary | ICD-10-CM | POA: Insufficient documentation

## 2021-12-21 DIAGNOSIS — C7951 Secondary malignant neoplasm of bone: Secondary | ICD-10-CM | POA: Insufficient documentation

## 2021-12-21 DIAGNOSIS — Z170 Estrogen receptor positive status [ER+]: Secondary | ICD-10-CM

## 2021-12-21 DIAGNOSIS — C3491 Malignant neoplasm of unspecified part of right bronchus or lung: Secondary | ICD-10-CM

## 2021-12-21 LAB — CMP (CANCER CENTER ONLY)
ALT: 8 U/L (ref 0–44)
AST: 14 U/L — ABNORMAL LOW (ref 15–41)
Albumin: 3.6 g/dL (ref 3.5–5.0)
Alkaline Phosphatase: 42 U/L (ref 38–126)
Anion gap: 6 (ref 5–15)
BUN: 16 mg/dL (ref 8–23)
CO2: 31 mmol/L (ref 22–32)
Calcium: 9.6 mg/dL (ref 8.9–10.3)
Chloride: 106 mmol/L (ref 98–111)
Creatinine: 0.85 mg/dL (ref 0.44–1.00)
GFR, Estimated: >60 mL/min (ref >60)
Glucose, Bld: 144 mg/dL — ABNORMAL HIGH (ref 70–99)
Potassium: 3.4 mmol/L — ABNORMAL LOW (ref 3.5–5.1)
Sodium: 143 mmol/L (ref 135–145)
Total Bilirubin: 0.6 mg/dL (ref 0.3–1.2)
Total Protein: 6.2 g/dL — ABNORMAL LOW (ref 6.5–8.1)

## 2021-12-21 LAB — CBC WITH DIFFERENTIAL (CANCER CENTER ONLY)
Abs Immature Granulocytes: 0.01 10*3/uL (ref 0.00–0.07)
Basophils Absolute: 0.1 10*3/uL (ref 0.0–0.1)
Basophils Relative: 1 %
Eosinophils Absolute: 0.5 10*3/uL (ref 0.0–0.5)
Eosinophils Relative: 8 %
HCT: 39.7 % (ref 36.0–46.0)
Hemoglobin: 13.4 g/dL (ref 12.0–15.0)
Immature Granulocytes: 0 %
Lymphocytes Relative: 14 %
Lymphs Abs: 1 10*3/uL (ref 0.7–4.0)
MCH: 31.6 pg (ref 26.0–34.0)
MCHC: 33.8 g/dL (ref 30.0–36.0)
MCV: 93.6 fL (ref 80.0–100.0)
Monocytes Absolute: 0.6 10*3/uL (ref 0.1–1.0)
Monocytes Relative: 9 %
Neutro Abs: 4.9 10*3/uL (ref 1.7–7.7)
Neutrophils Relative %: 68 %
Platelet Count: 206 10*3/uL (ref 150–400)
RBC: 4.24 MIL/uL (ref 3.87–5.11)
RDW: 13.2 % (ref 11.5–15.5)
WBC Count: 7.1 10*3/uL (ref 4.0–10.5)
nRBC: 0 % (ref 0.0–0.2)

## 2021-12-21 MED ORDER — FOLIC ACID 1 MG PO TABS: 1.0000 mg | ORAL_TABLET | Freq: Every day | ORAL | 2 refills | Status: DC

## 2021-12-21 MED ORDER — CYANOCOBALAMIN 1000 MCG/ML IJ SOLN
1000.0000 ug | Freq: Once | INTRAMUSCULAR | Status: AC
  Administered 2021-12-21: 1000 ug via INTRAMUSCULAR

## 2021-12-21 MED ORDER — PROCHLORPERAZINE MALEATE 10 MG PO TABS: 10.0000 mg | ORAL_TABLET | Freq: Four times a day (QID) | ORAL | 2 refills | Status: DC | PRN

## 2021-12-21 NOTE — Progress Notes (Signed)
START ON PATHWAY REGIMEN - Non-Small Cell Lung     A cycle is every 21 days:     Pembrolizumab      Pemetrexed      Carboplatin   **Always confirm dose/schedule in your pharmacy ordering system**  Patient Characteristics: Stage IV Metastatic, Nonsquamous, Molecular Analysis Completed, Molecular Alteration Present and Targeted Therapy Exhausted OR EGFR Exon 20+ or KRAS G12C+ or HER2+ Present and No Prior Chemo/Immunotherapy OR No Alteration Present, Initial  Chemotherapy/Immunotherapy, PS = 0, 1, BRAF/MET/KRAS/HER2 Mutation Positive, Candidate for Immunotherapy, PD-L1 Expression Positive  ? 50% (TPS) and Immunotherapy Candidate Therapeutic Status: Stage IV Metastatic Histology: Nonsquamous Cell Broad Molecular Profiling Status: Engineer, manufacturing Analysis Results: KRAS G12C Mutation Present and No Prior Chemo/Immunotherapy ECOG Performance Status: 1 Chemotherapy/Immunotherapy Line of Therapy: Initial Chemotherapy/Immunotherapy Immunotherapy Candidate Status: Candidate for Immunotherapy PD-L1 Expression Status: PD-L1 Positive ? 50% (TPS) Intent of Therapy: Non-Curative / Palliative Intent, Discussed with Patient

## 2021-12-21 NOTE — Patient Instructions (Signed)

## 2021-12-22 ENCOUNTER — Other Ambulatory Visit: Payer: Self-pay

## 2021-12-22 ENCOUNTER — Inpatient Hospital Stay: Payer: Medicare HMO

## 2021-12-22 ENCOUNTER — Encounter: Payer: Self-pay | Admitting: Internal Medicine

## 2021-12-22 NOTE — Progress Notes (Signed)
Unfortunately there aren't any foundations offering copay assistance for her Dx and the type of ins she has.  I emailed Vincente Liberty and Ailene Ravel in the radiation dept requesting they reach out to the pt regarding the [employer].

## 2021-12-23 ENCOUNTER — Telehealth: Payer: Self-pay | Admitting: Physician Assistant

## 2021-12-23 ENCOUNTER — Ambulatory Visit
Admission: RE | Admit: 2021-12-23 | Discharge: 2021-12-23 | Disposition: A | Discharge status: Home / Self Care | Payer: Medicare HMO | Source: Ambulatory Visit | Attending: Radiation Oncology | Admitting: Radiation Oncology

## 2021-12-23 DIAGNOSIS — C3432 Malignant neoplasm of lower lobe, left bronchus or lung: Secondary | ICD-10-CM | POA: Insufficient documentation

## 2021-12-23 DIAGNOSIS — Z510 Encounter for antineoplastic radiation therapy: Secondary | ICD-10-CM | POA: Insufficient documentation

## 2021-12-23 DIAGNOSIS — Z87891 Personal history of nicotine dependence: Secondary | ICD-10-CM | POA: Insufficient documentation

## 2021-12-23 DIAGNOSIS — C7951 Secondary malignant neoplasm of bone: Secondary | ICD-10-CM | POA: Diagnosis not present

## 2021-12-23 NOTE — Telephone Encounter (Signed)
Scheduled per 06/14 los, patient has been called and notified of upcoming appointments.

## 2021-12-25 ENCOUNTER — Other Ambulatory Visit: Payer: Self-pay | Admitting: Hematology

## 2021-12-27 ENCOUNTER — Other Ambulatory Visit: Payer: Self-pay | Admitting: Family Medicine

## 2021-12-27 DIAGNOSIS — Z170 Estrogen receptor positive status [ER+]: Secondary | ICD-10-CM | POA: Diagnosis not present

## 2021-12-27 DIAGNOSIS — C50412 Malignant neoplasm of upper-outer quadrant of left female breast: Secondary | ICD-10-CM | POA: Diagnosis not present

## 2021-12-27 DIAGNOSIS — C3492 Malignant neoplasm of unspecified part of left bronchus or lung: Secondary | ICD-10-CM | POA: Diagnosis not present

## 2021-12-27 MED FILL — Dexamethasone Sodium Phosphate Inj 100 MG/10ML: INTRAMUSCULAR | Qty: 1 | Status: AC

## 2021-12-27 MED FILL — Fosaprepitant Dimeglumine For IV Infusion 150 MG (Base Eq): INTRAVENOUS | Qty: 5 | Status: AC

## 2021-12-28 ENCOUNTER — Inpatient Hospital Stay: Payer: Medicare HMO

## 2021-12-28 VITALS — BP 122/54 | HR 77 | Temp 97.7°F | Resp 16 | Wt 168.5 lb

## 2021-12-28 DIAGNOSIS — R918 Other nonspecific abnormal finding of lung field: Secondary | ICD-10-CM

## 2021-12-28 DIAGNOSIS — R509 Fever, unspecified: Secondary | ICD-10-CM | POA: Diagnosis not present

## 2021-12-28 DIAGNOSIS — C7951 Secondary malignant neoplasm of bone: Secondary | ICD-10-CM | POA: Diagnosis not present

## 2021-12-28 DIAGNOSIS — Z87891 Personal history of nicotine dependence: Secondary | ICD-10-CM | POA: Diagnosis not present

## 2021-12-28 DIAGNOSIS — Z510 Encounter for antineoplastic radiation therapy: Secondary | ICD-10-CM | POA: Diagnosis not present

## 2021-12-28 DIAGNOSIS — R502 Drug induced fever: Secondary | ICD-10-CM | POA: Diagnosis not present

## 2021-12-28 DIAGNOSIS — C3432 Malignant neoplasm of lower lobe, left bronchus or lung: Secondary | ICD-10-CM | POA: Diagnosis not present

## 2021-12-28 DIAGNOSIS — C3491 Malignant neoplasm of unspecified part of right bronchus or lung: Secondary | ICD-10-CM

## 2021-12-28 LAB — CBC WITH DIFFERENTIAL (CANCER CENTER ONLY)
Abs Immature Granulocytes: 0.01 10*3/uL (ref 0.00–0.07)
Basophils Absolute: 0.1 10*3/uL (ref 0.0–0.1)
Basophils Relative: 1 %
Eosinophils Absolute: 0.4 10*3/uL (ref 0.0–0.5)
Eosinophils Relative: 5 %
HCT: 39.5 % (ref 36.0–46.0)
Hemoglobin: 13.4 g/dL (ref 12.0–15.0)
Immature Granulocytes: 0 %
Lymphocytes Relative: 9 %
Lymphs Abs: 0.7 10*3/uL (ref 0.7–4.0)
MCH: 31.5 pg (ref 26.0–34.0)
MCHC: 33.9 g/dL (ref 30.0–36.0)
MCV: 92.7 fL (ref 80.0–100.0)
Monocytes Absolute: 0.9 10*3/uL (ref 0.1–1.0)
Monocytes Relative: 11 %
Neutro Abs: 6 10*3/uL (ref 1.7–7.7)
Neutrophils Relative %: 74 %
Platelet Count: 205 10*3/uL (ref 150–400)
RBC: 4.26 MIL/uL (ref 3.87–5.11)
RDW: 13.1 % (ref 11.5–15.5)
WBC Count: 8 10*3/uL (ref 4.0–10.5)
nRBC: 0 % (ref 0.0–0.2)

## 2021-12-28 LAB — CMP (CANCER CENTER ONLY)
ALT: 9 U/L (ref 0–44)
AST: 16 U/L (ref 15–41)
Albumin: 3.7 g/dL (ref 3.5–5.0)
Alkaline Phosphatase: 47 U/L (ref 38–126)
Anion gap: 6 (ref 5–15)
BUN: 17 mg/dL (ref 8–23)
CO2: 27 mmol/L (ref 22–32)
Calcium: 9.8 mg/dL (ref 8.9–10.3)
Chloride: 107 mmol/L (ref 98–111)
Creatinine: 0.74 mg/dL (ref 0.44–1.00)
GFR, Estimated: >60 mL/min (ref >60)
Glucose, Bld: 99 mg/dL (ref 70–99)
Potassium: 4.1 mmol/L (ref 3.5–5.1)
Sodium: 140 mmol/L (ref 135–145)
Total Bilirubin: 0.5 mg/dL (ref 0.3–1.2)
Total Protein: 6.5 g/dL (ref 6.5–8.1)

## 2021-12-28 LAB — TSH: TSH: 1.927 u[IU]/mL (ref 0.350–4.500)

## 2021-12-28 MED ORDER — PALONOSETRON HCL INJECTION 0.25 MG/5ML
0.2500 mg | Freq: Once | INTRAVENOUS | Status: AC
  Administered 2021-12-28: 0.25 mg via INTRAVENOUS
  Filled 2021-12-28: qty 5

## 2021-12-28 MED ORDER — SODIUM CHLORIDE 0.9 % IV SOLN
378.0000 mg | Freq: Once | INTRAVENOUS | Status: AC
  Administered 2021-12-28: 380 mg via INTRAVENOUS
  Filled 2021-12-28: qty 38

## 2021-12-28 MED ORDER — SODIUM CHLORIDE 0.9 % IV SOLN
150.0000 mg | Freq: Once | INTRAVENOUS | Status: AC
  Administered 2021-12-28: 150 mg via INTRAVENOUS
  Filled 2021-12-28: qty 150

## 2021-12-28 MED ORDER — SODIUM CHLORIDE 0.9 % IV SOLN
200.0000 mg | Freq: Once | INTRAVENOUS | Status: AC
  Administered 2021-12-28: 200 mg via INTRAVENOUS
  Filled 2021-12-28: qty 200

## 2021-12-28 MED ORDER — SODIUM CHLORIDE 0.9 % IV SOLN: Freq: Once | INTRAVENOUS | Status: AC

## 2021-12-28 MED ORDER — SODIUM CHLORIDE 0.9 % IV SOLN
500.0000 mg/m2 | Freq: Once | INTRAVENOUS | Status: AC
  Administered 2021-12-28: 900 mg via INTRAVENOUS
  Filled 2021-12-28: qty 20

## 2021-12-28 MED ORDER — SODIUM CHLORIDE 0.9 % IV SOLN
10.0000 mg | Freq: Once | INTRAVENOUS | Status: AC
  Administered 2021-12-28: 10 mg via INTRAVENOUS
  Filled 2021-12-28: qty 10

## 2021-12-28 NOTE — Patient Instructions (Signed)
[facility]  Discharge Instructions: Thank you for choosing [facility] to provide your oncology and hematology care.   If you have a lab appointment with the [facility], please go directly to the [facility] and check in at the registration area.   Wear comfortable clothing and clothing appropriate for easy access to any Portacath or PICC line.   We strive to give you quality time with your provider. You may need to reschedule your appointment if you arrive late (15 or more minutes).  Arriving late affects you and other patients whose appointments are after yours.  Also, if you miss three or more appointments without notifying the office, you may be dismissed from the clinic at the provider's discretion.      For prescription refill requests, have your pharmacy contact our office and allow 72 hours for refills to be completed.    Today you received the following chemotherapy and/or immunotherapy agents: Keytruda/Alimta/Carboplatin      To help prevent nausea and vomiting after your treatment, we encourage you to take your nausea medication as directed.  BELOW ARE SYMPTOMS THAT SHOULD BE REPORTED IMMEDIATELY: *FEVER GREATER THAN 100.4 F (38 C) OR HIGHER *CHILLS OR SWEATING *NAUSEA AND VOMITING THAT IS NOT CONTROLLED WITH YOUR NAUSEA MEDICATION *UNUSUAL SHORTNESS OF BREATH *UNUSUAL BRUISING OR BLEEDING *URINARY PROBLEMS (pain or burning when urinating, or frequent urination) *BOWEL PROBLEMS (unusual diarrhea, constipation, pain near the anus) TENDERNESS IN MOUTH AND THROAT WITH OR WITHOUT PRESENCE OF ULCERS (sore throat, sores in mouth, or a toothache) UNUSUAL RASH, SWELLING OR PAIN  UNUSUAL VAGINAL DISCHARGE OR ITCHING   Items with * indicate a potential emergency and should be followed up as soon as possible or go to the Emergency Department if any problems should occur.  Please show the CHEMOTHERAPY ALERT CARD or IMMUNOTHERAPY ALERT  CARD at check-in to the Emergency Department and triage nurse.  Should you have questions after your visit or need to cancel or reschedule your appointment, please contact [facility]  Dept: [phone]  and follow the prompts.  Office hours are 8:00 a.m. to 4:30 p.m. Monday - Friday. Please note that voicemails left after 4:00 p.m. may not be returned until the following business day.  We are closed weekends and major holidays. You have access to a nurse at all times for urgent questions. Please call the main number to the clinic Dept: [phone] and follow the prompts.   For any non-urgent questions, you may also contact your provider using MyChart. We now offer e-Visits for anyone 46 and older to request care online for non-urgent symptoms. For details visit [email or web address].   Also download the MyChart app! Go to the app store, search "MyChart", open the app, select [facility], and log in with your MyChart username and password.  Masks are optional in the cancer centers. If you would like for your care team to wear a mask while they are taking care of you, please let them know. For doctor visits, patients may have with them one support person who is at least 85 years old. At this time, visitors are not allowed in the infusion area.

## 2021-12-29 ENCOUNTER — Telehealth: Payer: Self-pay

## 2021-12-29 ENCOUNTER — Other Ambulatory Visit: Payer: Self-pay

## 2021-12-29 ENCOUNTER — Encounter (HOSPITAL_COMMUNITY): Payer: Self-pay

## 2021-12-29 ENCOUNTER — Emergency Department (HOSPITAL_COMMUNITY): Payer: Medicare HMO

## 2021-12-29 ENCOUNTER — Inpatient Hospital Stay (HOSPITAL_COMMUNITY)
Admission: EM | Admit: 2021-12-29 | Discharge: 2022-01-03 | DRG: 864 | Disposition: A | Discharge status: Home Health | Payer: Medicare HMO | Attending: Internal Medicine | Admitting: Internal Medicine

## 2021-12-29 DIAGNOSIS — Z91018 Allergy to other foods: Secondary | ICD-10-CM

## 2021-12-29 DIAGNOSIS — Z7982 Long term (current) use of aspirin: Secondary | ICD-10-CM

## 2021-12-29 DIAGNOSIS — E785 Hyperlipidemia, unspecified: Secondary | ICD-10-CM | POA: Diagnosis present

## 2021-12-29 DIAGNOSIS — Z87891 Personal history of nicotine dependence: Secondary | ICD-10-CM

## 2021-12-29 DIAGNOSIS — R509 Fever, unspecified: Principal | ICD-10-CM

## 2021-12-29 DIAGNOSIS — Z808 Family history of malignant neoplasm of other organs or systems: Secondary | ICD-10-CM

## 2021-12-29 DIAGNOSIS — I5033 Acute on chronic diastolic (congestive) heart failure: Secondary | ICD-10-CM | POA: Diagnosis not present

## 2021-12-29 DIAGNOSIS — F1021 Alcohol dependence, in remission: Secondary | ICD-10-CM | POA: Diagnosis present

## 2021-12-29 DIAGNOSIS — Z923 Personal history of irradiation: Secondary | ICD-10-CM

## 2021-12-29 DIAGNOSIS — R6520 Severe sepsis without septic shock: Secondary | ICD-10-CM | POA: Diagnosis present

## 2021-12-29 DIAGNOSIS — J441 Chronic obstructive pulmonary disease with (acute) exacerbation: Secondary | ICD-10-CM | POA: Diagnosis present

## 2021-12-29 DIAGNOSIS — Z20822 Contact with and (suspected) exposure to covid-19: Secondary | ICD-10-CM | POA: Diagnosis present

## 2021-12-29 DIAGNOSIS — E876 Hypokalemia: Secondary | ICD-10-CM | POA: Diagnosis not present

## 2021-12-29 DIAGNOSIS — I10 Essential (primary) hypertension: Secondary | ICD-10-CM | POA: Diagnosis present

## 2021-12-29 DIAGNOSIS — Z885 Allergy status to narcotic agent status: Secondary | ICD-10-CM | POA: Diagnosis not present

## 2021-12-29 DIAGNOSIS — M1990 Unspecified osteoarthritis, unspecified site: Secondary | ICD-10-CM | POA: Diagnosis present

## 2021-12-29 DIAGNOSIS — R9431 Abnormal electrocardiogram [ECG] [EKG]: Secondary | ICD-10-CM | POA: Diagnosis not present

## 2021-12-29 DIAGNOSIS — R502 Drug induced fever: Principal | ICD-10-CM | POA: Diagnosis present

## 2021-12-29 DIAGNOSIS — T451X5A Adverse effect of antineoplastic and immunosuppressive drugs, initial encounter: Secondary | ICD-10-CM | POA: Diagnosis present

## 2021-12-29 DIAGNOSIS — Z8673 Personal history of transient ischemic attack (TIA), and cerebral infarction without residual deficits: Secondary | ICD-10-CM

## 2021-12-29 DIAGNOSIS — Z9981 Dependence on supplemental oxygen: Secondary | ICD-10-CM

## 2021-12-29 DIAGNOSIS — Z7902 Long term (current) use of antithrombotics/antiplatelets: Secondary | ICD-10-CM

## 2021-12-29 DIAGNOSIS — J90 Pleural effusion, not elsewhere classified: Secondary | ICD-10-CM | POA: Diagnosis not present

## 2021-12-29 DIAGNOSIS — A419 Sepsis, unspecified organism: Secondary | ICD-10-CM | POA: Diagnosis not present

## 2021-12-29 DIAGNOSIS — R69 Illness, unspecified: Secondary | ICD-10-CM | POA: Diagnosis not present

## 2021-12-29 DIAGNOSIS — C3432 Malignant neoplasm of lower lobe, left bronchus or lung: Secondary | ICD-10-CM | POA: Diagnosis present

## 2021-12-29 DIAGNOSIS — I110 Hypertensive heart disease with heart failure: Secondary | ICD-10-CM | POA: Diagnosis present

## 2021-12-29 DIAGNOSIS — K219 Gastro-esophageal reflux disease without esophagitis: Secondary | ICD-10-CM | POA: Diagnosis not present

## 2021-12-29 DIAGNOSIS — Z79810 Long term (current) use of selective estrogen receptor modulators (SERMs): Secondary | ICD-10-CM

## 2021-12-29 DIAGNOSIS — J9621 Acute and chronic respiratory failure with hypoxia: Secondary | ICD-10-CM | POA: Diagnosis not present

## 2021-12-29 DIAGNOSIS — Z853 Personal history of malignant neoplasm of breast: Secondary | ICD-10-CM | POA: Diagnosis not present

## 2021-12-29 DIAGNOSIS — Z66 Do not resuscitate: Secondary | ICD-10-CM | POA: Diagnosis present

## 2021-12-29 DIAGNOSIS — Z79899 Other long term (current) drug therapy: Secondary | ICD-10-CM

## 2021-12-29 DIAGNOSIS — R0902 Hypoxemia: Secondary | ICD-10-CM | POA: Diagnosis not present

## 2021-12-29 DIAGNOSIS — J410 Simple chronic bronchitis: Secondary | ICD-10-CM | POA: Diagnosis not present

## 2021-12-29 DIAGNOSIS — Z8249 Family history of ischemic heart disease and other diseases of the circulatory system: Secondary | ICD-10-CM

## 2021-12-29 DIAGNOSIS — J9611 Chronic respiratory failure with hypoxia: Secondary | ICD-10-CM | POA: Diagnosis not present

## 2021-12-29 DIAGNOSIS — C3491 Malignant neoplasm of unspecified part of right bronchus or lung: Secondary | ICD-10-CM | POA: Diagnosis not present

## 2021-12-29 DIAGNOSIS — J449 Chronic obstructive pulmonary disease, unspecified: Secondary | ICD-10-CM | POA: Diagnosis present

## 2021-12-29 LAB — CBC WITH DIFFERENTIAL/PLATELET
Abs Immature Granulocytes: 0.03 10*3/uL (ref 0.00–0.07)
Basophils Absolute: 0 10*3/uL (ref 0.0–0.1)
Basophils Relative: 0 %
Eosinophils Absolute: 0 10*3/uL (ref 0.0–0.5)
Eosinophils Relative: 0 %
HCT: 39.9 % (ref 36.0–46.0)
Hemoglobin: 13.2 g/dL (ref 12.0–15.0)
Immature Granulocytes: 0 %
Lymphocytes Relative: 1 %
Lymphs Abs: 0.1 10*3/uL — ABNORMAL LOW (ref 0.7–4.0)
MCH: 31.7 pg (ref 26.0–34.0)
MCHC: 33.1 g/dL (ref 30.0–36.0)
MCV: 95.9 fL (ref 80.0–100.0)
Monocytes Absolute: 0.2 10*3/uL (ref 0.1–1.0)
Monocytes Relative: 2 %
Neutro Abs: 9.1 10*3/uL — ABNORMAL HIGH (ref 1.7–7.7)
Neutrophils Relative %: 97 %
Platelets: 160 10*3/uL (ref 150–400)
RBC: 4.16 MIL/uL (ref 3.87–5.11)
RDW: 13.4 % (ref 11.5–15.5)
WBC: 9.4 10*3/uL (ref 4.0–10.5)
nRBC: 0 % (ref 0.0–0.2)

## 2021-12-29 LAB — COMPREHENSIVE METABOLIC PANEL
ALT: 16 U/L (ref 0–44)
AST: 24 U/L (ref 15–41)
Albumin: 3.5 g/dL (ref 3.5–5.0)
Alkaline Phosphatase: 40 U/L (ref 38–126)
Anion gap: 7 (ref 5–15)
BUN: 21 mg/dL (ref 8–23)
CO2: 26 mmol/L (ref 22–32)
Calcium: 9.4 mg/dL (ref 8.9–10.3)
Chloride: 105 mmol/L (ref 98–111)
Creatinine, Ser: 0.79 mg/dL (ref 0.44–1.00)
GFR, Estimated: >60 mL/min (ref >60)
Glucose, Bld: 119 mg/dL — ABNORMAL HIGH (ref 70–99)
Potassium: 4.3 mmol/L (ref 3.5–5.1)
Sodium: 138 mmol/L (ref 135–145)
Total Bilirubin: 0.5 mg/dL (ref 0.3–1.2)
Total Protein: 6.4 g/dL — ABNORMAL LOW (ref 6.5–8.1)

## 2021-12-29 LAB — PROTIME-INR
INR: 1.2 (ref 0.8–1.2)
Prothrombin Time: 15.1 seconds (ref 11.4–15.2)

## 2021-12-29 LAB — APTT: aPTT: 25 seconds (ref 24–36)

## 2021-12-29 LAB — RESP PANEL BY RT-PCR (FLU A&B, COVID) ARPGX2
Influenza A by PCR: NEGATIVE
Influenza B by PCR: NEGATIVE
SARS Coronavirus 2 by RT PCR: NEGATIVE

## 2021-12-29 LAB — LACTIC ACID, PLASMA
Lactic Acid, Venous: 2.1 mmol/L (ref 0.5–1.9)
Lactic Acid, Venous: 1.2 mmol/L (ref 0.5–1.9)

## 2021-12-29 MED ORDER — SODIUM CHLORIDE 0.9 % IV SOLN
2.0000 g | Freq: Once | INTRAVENOUS | Status: AC
  Administered 2021-12-29: 2 g via INTRAVENOUS
  Filled 2021-12-29: qty 12.5

## 2021-12-29 MED ORDER — METRONIDAZOLE 500 MG/100ML IV SOLN
500.0000 mg | Freq: Once | INTRAVENOUS | Status: AC
  Administered 2021-12-29: 500 mg via INTRAVENOUS
  Filled 2021-12-29: qty 100

## 2021-12-29 MED ORDER — VANCOMYCIN HCL 1750 MG/350ML IV SOLN
1750.0000 mg | Freq: Once | INTRAVENOUS | Status: AC
  Administered 2021-12-29: 1750 mg via INTRAVENOUS
  Filled 2021-12-29: qty 350

## 2021-12-29 MED ORDER — VANCOMYCIN HCL IN DEXTROSE 1-5 GM/200ML-% IV SOLN: 1000.0000 mg | Freq: Once | INTRAVENOUS | Status: DC

## 2021-12-29 MED ORDER — SODIUM CHLORIDE 0.9 % IV BOLUS
1000.0000 mL | Freq: Once | INTRAVENOUS | Status: AC
  Administered 2021-12-29: 1000 mL via INTRAVENOUS

## 2021-12-29 NOTE — Progress Notes (Signed)
A consult was received from an ED physician for cefepime and vancomycin per pharmacy dosing.  The patient's profile has been reviewed for ht/wt/allergies/indication/available labs.    I agree with order placed by provider for cefepime 2 g IV x 1.  I have entered a one time order for vancomycin 1750 mg IV.    Further antibiotics/pharmacy consults should be ordered by admitting physician if indicated.                       Thank you, Suzzanne Cloud, PharmD, BCPS 12/29/2021  6:20 PM

## 2021-12-29 NOTE — ED Provider Triage Note (Cosign Needed)
Emergency Medicine Provider Triage Evaluation Note  Sheila Shields , a 87 y.o. female  was evaluated in triage.  Pt complains of fever. Lung cancer pt who had her first chemo yesterday. Today she felt chills, checked her temp and it was 100.8.  she was instructed by her chemo card to come to the ER.  Denies worsening sob or cough, no dysuria or headache.  Does endorse nausea, no abd pain.  On home O2  Review of Systems  Positive: As above Negative: As above  Physical Exam  BP (!) 150/66 (BP Location: Left Arm)   Pulse (!) 102   Temp 99.9 F (37.7 C) (Oral)   Resp 18   Ht 5\' 4"  (1.626 m)   Wt 76.4 kg   SpO2 (!) 78%   BMI 28.92 kg/m  Gen:   Awake, no distress   Resp:  Normal effort  MSK:   Moves extremities without difficulty  Other:    Medical Decision Making  Medically screening exam initiated at 5:12 PM.  Appropriate orders placed.  CHARONDA HEFTER was informed that the remainder of the evaluation will be completed by another provider, this initial triage assessment does not replace that evaluation, and the importance of remaining in the ED until their evaluation is complete.     Domenic Moras, PA-C 12/29/21 [phone]

## 2021-12-29 NOTE — ED Notes (Signed)
Pt provided pitcher of water and ice chips, Zammit MD stated pt can eat/drink.

## 2021-12-29 NOTE — Telephone Encounter (Signed)
-----   Message from Willis Modena, RN sent at 12/28/2021  2:44 PM EDT ----- Regarding: Dr. Julien Nordmann 1st time Keytruda/Alimta/Carbo f/u [facility] well Dr. Julien Nordmann 1st time Keytruda/Alimta/Carbo f/u [facility] Pt call back due 12/29/21

## 2021-12-29 NOTE — Telephone Encounter (Signed)
Sheila Shields states that she ifine. She is eating, drinking, and urinating well. She cc of sinus headache the has bben there for a while.  No green sinus drainage.  Suggested that she call her PCP to discuss since they have given her ATB and Prednison in the past for these symptoms and this is H/A has been an on going issue for her. She knows to call the office at [phone] if she has any questions or concerns.

## 2021-12-29 NOTE — ED Triage Notes (Signed)
Patient reports that she received her first chemo for lung cancer yesterday. Today, the patient reports a T.100.8 at home. Patient states she took a Tramadol approx 1 hour ago.

## 2021-12-29 NOTE — ED Provider Notes (Incomplete)
[facility] DEPT Provider Note   CSN: [id number] Arrival date & time: 12/29/21  1651     History {Add pertinent medical, surgical, social history, OB history to HPI:1} Chief Complaint  Patient presents with   cancer patient   Chills   Fever    Sheila Shields is a 87 y.o. female.  Patient has stage IV lung cancer.  She received chemotherapy yesterday and has developed fever and nausea and vomiting today   Fever      Home Medications Prior to Admission medications   Medication Sig Start Date End Date Taking? Authorizing Provider  acetaminophen (TYLENOL) 650 MG CR tablet Take 1,300 mg by mouth 2 (two) times daily.    [provider]  albuterol (PROAIR HFA) 108 (90 Base) MCG/ACT inhaler INHALE 2 PUFFS EVERY 4 HOURS AS NEEDED FOR COUGHING SPELLS Patient not taking: Reported on 12/02/2021 05/24/20   Marin Olp, MD  aspirin 325 MG tablet Take 1 tablet (325 mg total) by mouth daily. 01/01/19   Louk, Bea Graff, PA-C  azelastine (ASTELIN) 0.1 % nasal spray Place 2 sprays into both nostrils 2 (two) times daily. Use in each nostril as directed 10/28/21   Cobb, Karie Schwalbe, NP  benzonatate (TESSALON) 200 MG capsule Take 1 capsule (200 mg total) by mouth 3 (three) times daily as needed for cough. Patient not taking: Reported on 12/02/2021 10/28/21   Clayton Bibles, NP  Cholecalciferol (VITAMIN D) 50 MCG (2000 UT) tablet Take 2,000 Units by mouth daily.    [provider]  clopidogrel (PLAVIX) 75 MG tablet Take 0.5 tablets (37.5 mg total) by mouth daily. 08/03/21   Marin Olp, MD  Coenzyme Q10 300 MG CAPS Take 300 mg by mouth daily.    [provider]  fexofenadine (ALLEGRA) 180 MG tablet Take 180 mg by mouth daily.    [provider]  fluticasone (FLONASE) 50 MCG/ACT nasal spray Place 2 sprays into both nostrils daily. 09/24/18   Collene Gobble, MD  folic acid (FOLVITE) 1 MG tablet Take 1 tablet (1 mg total) by  mouth daily. 12/21/21   Heilingoetter, Cassandra L, PA-C  Glycopyrrolate-Formoterol (BEVESPI AEROSPHERE) 9-4.8 MCG/ACT AERO Inhale 2 puffs into the lungs 2 (two) times daily. 10/12/21   Collene Gobble, MD  hydrochlorothiazide (HYDRODIURIL) 25 MG tablet Take 0.5 tablets (12.5 mg total) by mouth daily. 08/03/21   Marin Olp, MD  montelukast (SINGULAIR) 10 MG tablet Take 1 tablet (10 mg total) by mouth at bedtime. 10/28/21   Cobb, Karie Schwalbe, NP  Multiple Vitamin (MULTIVITAMIN WITH MINERALS) TABS tablet Take 1 tablet by mouth daily.    [provider]  pantoprazole (PROTONIX) 40 MG tablet Take 1 tablet (40 mg total) by mouth daily. 10/28/21   Cobb, Karie Schwalbe, NP  potassium chloride SA (KLOR-CON M) 20 MEQ tablet TAKE 1 TABLET BY MOUTH EVERY OTHER DAY 12/27/21   Marin Olp, MD  prochlorperazine (COMPAZINE) 10 MG tablet Take 1 tablet (10 mg total) by mouth every 6 (six) hours as needed. 12/21/21   Heilingoetter, Cassandra L, PA-C  Propylene Glycol (SYSTANE BALANCE OP) Place 1 drop into both eyes daily.    [provider]  rosuvastatin (CRESTOR) 20 MG tablet Take 1 tablet (20 mg total) by mouth daily. 08/03/21   Marin Olp, MD  tamoxifen (NOLVADEX) 20 MG tablet Take 1 tablet (20 mg total) by mouth daily. 09/12/21   Alla Feeling, NP  traMADol Veatrice Bourbon) 50  MG tablet TAKE ONE TABLET BY MOUTH EVERY 8 HOURS AS NEEDED FOR PAIN FOR UP TO 5 DAYS (DO NOT DRIVE FOR 8 HOURS AFTER TAKING) 08/15/21   Marin Olp, MD  traZODone (DESYREL) 50 MG tablet TAKE 1 TABLET BY MOUTH EVERY NIGHT AT BEDTIME AS NEEDED FOR SLEEP Patient taking differently: Take 50 mg by mouth at bedtime. 08/15/21   Marin Olp, MD  vitamin B-12 (CYANOCOBALAMIN) 1000 MCG tablet Take 1,000 mcg by mouth daily.    [provider]      Allergies    Alcohol, Cheese, and Dilaudid [hydromorphone hcl]    Review of Systems   Review of Systems  Constitutional:  Positive for fever.    Physical  Exam Updated Vital Signs BP (!) 158/74   Pulse 97   Temp 99.9 F (37.7 C) (Oral)   Resp 18   Ht 5\' 4"  (1.626 m)   Wt 76.4 kg   SpO2 92%   BMI 28.92 kg/m  Physical Exam  ED Results / Procedures / Treatments   Labs (all labs ordered are listed, but only abnormal results are displayed) Labs Reviewed  LACTIC ACID, PLASMA - Abnormal; Notable for the following components:      Result Value   Lactic Acid, Venous 2.1 (*)    All other components within normal limits  COMPREHENSIVE METABOLIC PANEL - Abnormal; Notable for the following components:   Glucose, Bld 119 (*)    Total Protein 6.4 (*)    All other components within normal limits  CBC WITH DIFFERENTIAL/PLATELET - Abnormal; Notable for the following components:   Neutro Abs 9.1 (*)    Lymphs Abs 0.1 (*)    All other components within normal limits  RESP PANEL BY RT-PCR (FLU A&B, COVID) ARPGX2  CULTURE, BLOOD (ROUTINE X 2)  CULTURE, BLOOD (ROUTINE X 2)  URINE CULTURE  PROTIME-INR  APTT  LACTIC ACID, PLASMA  URINALYSIS, ROUTINE W REFLEX MICROSCOPIC    EKG None  Radiology DG Chest 2 View  Result Date: 12/29/2021 CLINICAL DATA:  History of lung cancer. Status post chemotherapy. Presents with chills. EXAM: CHEST - 2 VIEW COMPARISON:  10/28/2021. FINDINGS: Heart size and mediastinal contours are unremarkable. Small left pleural effusion. No airspace consolidation. Known left lower lobe lung nodule is again noted measuring 2.5 cm. Asymmetric increase interstitial prominence is noted within the left lung which appears similar to the previous imaging. No signs of atelectasis or pneumothorax. The visualized osseous structures appear intact. IMPRESSION: 1. No acute cardiopulmonary abnormalities. 2. Left lung nodule compatible with known lung cancer. 3. Unchanged small left pleural effusion with asymmetric interstitial prominence within the left lung. Electronically Signed   By: Kerby Moors M.D.   On: 12/29/2021 17:42     Procedures Procedures  {Document cardiac monitor, telemetry assessment procedure when appropriate:1}  Medications Ordered in ED Medications  ceFEPIme (MAXIPIME) 2 g in sodium chloride 0.9 % 100 mL IVPB (0 g Intravenous Stopped 12/29/21 2054)  metroNIDAZOLE (FLAGYL) IVPB 500 mg (0 mg Intravenous Stopped 12/29/21 2055)  sodium chloride 0.9 % bolus 1,000 mL (0 mLs Intravenous Stopped 12/29/21 2054)  vancomycin (VANCOREADY) IVPB 1750 mg/350 mL (0 mg Intravenous Stopped 12/29/21 2146)    ED Course/ Medical Decision Making/ A&P                           Medical Decision Making Amount and/or Complexity of Data Reviewed Labs: ordered. ECG/medicine tests: ordered.  Risk Prescription  drug management. Decision regarding hospitalization.   Patient with febrile illness after getting chemotherapy yesterday.  She has been cultured and will be admitted to medicine for observation  {Document critical care time when appropriate:1} {Document review of labs and clinical decision tools ie heart score, Chads2Vasc2 etc:1}  {Document your independent review of radiology images, and any outside records:1} {Document your discussion with family members, caretakers, and with consultants:1} {Document social determinants of health affecting pt's care:1} {Document your decision making why or why not admission, treatments were needed:1} Final Clinical Impression(s) / ED Diagnoses Final diagnoses:  None    Rx / DC Orders ED Discharge Orders     None

## 2021-12-30 DIAGNOSIS — R6520 Severe sepsis without septic shock: Secondary | ICD-10-CM

## 2021-12-30 DIAGNOSIS — A419 Sepsis, unspecified organism: Secondary | ICD-10-CM | POA: Diagnosis not present

## 2021-12-30 LAB — URINALYSIS, ROUTINE W REFLEX MICROSCOPIC
Bilirubin Urine: NEGATIVE
Glucose, UA: NEGATIVE mg/dL
Hgb urine dipstick: NEGATIVE
Ketones, ur: NEGATIVE mg/dL
Nitrite: NEGATIVE
Protein, ur: NEGATIVE mg/dL
Specific Gravity, Urine: 1.023 (ref 1.005–1.030)
pH: 5 (ref 5.0–8.0)

## 2021-12-30 LAB — CBC WITH DIFFERENTIAL/PLATELET
Abs Immature Granulocytes: 0.02 10*3/uL (ref 0.00–0.07)
Basophils Absolute: 0 10*3/uL (ref 0.0–0.1)
Basophils Relative: 0 %
Eosinophils Absolute: 0 10*3/uL (ref 0.0–0.5)
Eosinophils Relative: 0 %
HCT: 38.3 % (ref 36.0–46.0)
Hemoglobin: 12.4 g/dL (ref 12.0–15.0)
Immature Granulocytes: 0 %
Lymphocytes Relative: 1 %
Lymphs Abs: 0.1 10*3/uL — ABNORMAL LOW (ref 0.7–4.0)
MCH: 31.5 pg (ref 26.0–34.0)
MCHC: 32.4 g/dL (ref 30.0–36.0)
MCV: 97.2 fL (ref 80.0–100.0)
Monocytes Absolute: 0.1 10*3/uL (ref 0.1–1.0)
Monocytes Relative: 2 %
Neutro Abs: 5.9 10*3/uL (ref 1.7–7.7)
Neutrophils Relative %: 97 %
Platelets: 137 10*3/uL — ABNORMAL LOW (ref 150–400)
RBC: 3.94 MIL/uL (ref 3.87–5.11)
RDW: 13.6 % (ref 11.5–15.5)
WBC: 6.1 10*3/uL (ref 4.0–10.5)
nRBC: 0 % (ref 0.0–0.2)

## 2021-12-30 LAB — COMPREHENSIVE METABOLIC PANEL
ALT: 29 U/L (ref 0–44)
AST: 51 U/L — ABNORMAL HIGH (ref 15–41)
Albumin: 2.9 g/dL — ABNORMAL LOW (ref 3.5–5.0)
Alkaline Phosphatase: 36 U/L — ABNORMAL LOW (ref 38–126)
Anion gap: 9 (ref 5–15)
BUN: 18 mg/dL (ref 8–23)
CO2: 24 mmol/L (ref 22–32)
Calcium: 8.5 mg/dL — ABNORMAL LOW (ref 8.9–10.3)
Chloride: 104 mmol/L (ref 98–111)
Creatinine, Ser: 0.72 mg/dL (ref 0.44–1.00)
GFR, Estimated: >60 mL/min (ref >60)
Glucose, Bld: 118 mg/dL — ABNORMAL HIGH (ref 70–99)
Potassium: 4 mmol/L (ref 3.5–5.1)
Sodium: 137 mmol/L (ref 135–145)
Total Bilirubin: 1.2 mg/dL (ref 0.3–1.2)
Total Protein: 5.5 g/dL — ABNORMAL LOW (ref 6.5–8.1)

## 2021-12-30 LAB — PROCALCITONIN: Procalcitonin: 0.15 ng/mL

## 2021-12-30 MED ORDER — ONDANSETRON HCL 4 MG PO TABS: 4.0000 mg | ORAL_TABLET | Freq: Four times a day (QID) | ORAL | Status: DC | PRN

## 2021-12-30 MED ORDER — ROSUVASTATIN CALCIUM 10 MG PO TABS
20.0000 mg | ORAL_TABLET | Freq: Every day | ORAL | Status: DC
  Administered 2021-12-30 – 2022-01-03 (×5): 20 mg via ORAL
  Filled 2021-12-30 (×3): qty 2
  Filled 2021-12-30: qty 1
  Filled 2021-12-30: qty 2

## 2021-12-30 MED ORDER — FLUTICASONE PROPIONATE 50 MCG/ACT NA SUSP
2.0000 | Freq: Every day | NASAL | Status: DC
Start: 2021-12-30 — End: 2022-01-03
  Administered 2021-12-31 – 2022-01-03 (×4): 2 via NASAL
  Filled 2021-12-30 (×2): qty 16

## 2021-12-30 MED ORDER — ENOXAPARIN SODIUM 40 MG/0.4ML IJ SOSY
40.0000 mg | PREFILLED_SYRINGE | INTRAMUSCULAR | Status: DC
  Administered 2021-12-30 – 2022-01-03 (×5): 40 mg via SUBCUTANEOUS
  Filled 2021-12-30 (×5): qty 0.4

## 2021-12-30 MED ORDER — CEFEPIME HCL 2 G IV SOLR
2.0000 g | Freq: Two times a day (BID) | INTRAVENOUS | Status: DC
  Administered 2021-12-30 – 2021-12-31 (×3): 2 g via INTRAVENOUS
  Filled 2021-12-30 (×3): qty 12.5

## 2021-12-30 MED ORDER — ALBUTEROL SULFATE (2.5 MG/3ML) 0.083% IN NEBU: 2.5000 mg | INHALATION_SOLUTION | RESPIRATORY_TRACT | Status: DC | PRN

## 2021-12-30 MED ORDER — VITAMIN B-12 1000 MCG PO TABS
1000.0000 ug | ORAL_TABLET | Freq: Every day | ORAL | Status: DC
  Administered 2021-12-30 – 2022-01-03 (×5): 1000 ug via ORAL
  Filled 2021-12-30 (×5): qty 1

## 2021-12-30 MED ORDER — VITAMIN D 25 MCG (1000 UNIT) PO TABS
2000.0000 [IU] | ORAL_TABLET | Freq: Every day | ORAL | Status: DC
  Administered 2021-12-30 – 2022-01-03 (×5): 2000 [IU] via ORAL
  Filled 2021-12-30 (×5): qty 2

## 2021-12-30 MED ORDER — ASPIRIN 325 MG PO TABS
325.0000 mg | ORAL_TABLET | Freq: Every day | ORAL | Status: DC
  Administered 2021-12-30 – 2022-01-03 (×5): 325 mg via ORAL
  Filled 2021-12-30 (×5): qty 1

## 2021-12-30 MED ORDER — ACETAMINOPHEN 325 MG PO TABS
650.0000 mg | ORAL_TABLET | Freq: Four times a day (QID) | ORAL | Status: DC | PRN
  Administered 2021-12-30 – 2022-01-03 (×10): 650 mg via ORAL
  Filled 2021-12-30 (×9): qty 2

## 2021-12-30 MED ORDER — ONDANSETRON HCL 4 MG/2ML IJ SOLN: 4.0000 mg | Freq: Four times a day (QID) | INTRAMUSCULAR | Status: DC | PRN

## 2021-12-30 MED ORDER — VANCOMYCIN HCL IN DEXTROSE 1-5 GM/200ML-% IV SOLN
1000.0000 mg | INTRAVENOUS | Status: DC
  Administered 2021-12-30: 1000 mg via INTRAVENOUS
  Filled 2021-12-30: qty 200

## 2021-12-30 MED ORDER — ARFORMOTEROL TARTRATE 15 MCG/2ML IN NEBU
15.0000 ug | INHALATION_SOLUTION | Freq: Two times a day (BID) | RESPIRATORY_TRACT | Status: DC
  Administered 2021-12-30 – 2022-01-03 (×8): 15 ug via RESPIRATORY_TRACT
  Filled 2021-12-30 (×8): qty 2

## 2021-12-30 MED ORDER — MONTELUKAST SODIUM 10 MG PO TABS
10.0000 mg | ORAL_TABLET | Freq: Every day | ORAL | Status: DC
Start: 2021-12-30 — End: 2022-01-03
  Administered 2021-12-30 – 2022-01-02 (×4): 10 mg via ORAL
  Filled 2021-12-30 (×4): qty 1

## 2021-12-30 MED ORDER — CLOPIDOGREL BISULFATE 75 MG PO TABS
37.5000 mg | ORAL_TABLET | Freq: Every day | ORAL | Status: DC
  Administered 2021-12-30 – 2022-01-03 (×5): 37.5 mg via ORAL
  Filled 2021-12-30: qty 0.5
  Filled 2021-12-30 (×4): qty 1

## 2021-12-30 MED ORDER — UMECLIDINIUM BROMIDE 62.5 MCG/ACT IN AEPB
1.0000 | INHALATION_SPRAY | Freq: Every day | RESPIRATORY_TRACT | Status: DC
  Administered 2021-12-30 – 2022-01-01 (×3): 1 via RESPIRATORY_TRACT
  Filled 2021-12-30: qty 7

## 2021-12-30 MED ORDER — ACETAMINOPHEN 650 MG RE SUPP: 650.0000 mg | Freq: Four times a day (QID) | RECTAL | Status: DC | PRN

## 2021-12-30 MED ORDER — ADULT MULTIVITAMIN W/MINERALS CH
1.0000 | ORAL_TABLET | Freq: Every day | ORAL | Status: DC
  Administered 2021-12-30 – 2022-01-03 (×5): 1 via ORAL
  Filled 2021-12-30 (×5): qty 1

## 2021-12-30 MED ORDER — HEPARIN SODIUM (PORCINE) 5000 UNIT/ML IJ SOLN
5000.0000 [IU] | Freq: Three times a day (TID) | INTRAMUSCULAR | Status: DC
Start: 2021-12-30 — End: 2021-12-30

## 2021-12-30 MED ORDER — SODIUM CHLORIDE 0.9 % IV SOLN: INTRAVENOUS | Status: AC

## 2021-12-30 MED ORDER — PANTOPRAZOLE SODIUM 40 MG PO TBEC
40.0000 mg | DELAYED_RELEASE_TABLET | Freq: Every day | ORAL | Status: DC
  Administered 2021-12-30 – 2022-01-03 (×5): 40 mg via ORAL
  Filled 2021-12-30 (×5): qty 1

## 2021-12-30 MED ORDER — TAMOXIFEN CITRATE 10 MG PO TABS
20.0000 mg | ORAL_TABLET | Freq: Every day | ORAL | Status: DC
  Administered 2021-12-30 – 2022-01-03 (×5): 20 mg via ORAL
  Filled 2021-12-30 (×5): qty 2

## 2021-12-30 MED ORDER — FOLIC ACID 1 MG PO TABS
1.0000 mg | ORAL_TABLET | Freq: Every day | ORAL | Status: DC
  Administered 2021-12-30 – 2022-01-03 (×5): 1 mg via ORAL
  Filled 2021-12-30 (×5): qty 1

## 2021-12-30 MED ORDER — METRONIDAZOLE 500 MG/100ML IV SOLN
500.0000 mg | Freq: Two times a day (BID) | INTRAVENOUS | Status: DC
  Administered 2021-12-30 – 2021-12-31 (×3): 500 mg via INTRAVENOUS
  Filled 2021-12-30 (×3): qty 100

## 2021-12-30 NOTE — H&P (Signed)
History and Physical    Sheila Shields DOB: 08/06/1934 DOA: 12/29/2021  PCP: Shelva Majestic, MD  Patient coming from: Home  Chief Complaint: Fever  HPI: Sheila Shields is a 87 y.o. female with medical history significant of stage Ia left breast cancer diagnosed in August 2021 status post left breast lumpectomy/radioactive seed localization/adjuvant radiotherapy currently on tamoxifen, stage IV non-small cell lung cancer diagnosed in May 2023 received first chemo treatment on 6/21, alcoholism in remission, COPD, GERD, hyperlipidemia, hypertension, anxiety, depression, TIA.  She presented to the ED for evaluation of fever with temperature 100.8 F at home, chills, and nausea.  Slightly tachycardic on arrival to the ED, temperature 99.9 F.  Labs showing WBC 9.4, hemoglobin 13.2, platelet count 160k.  Sodium 138, potassium 4.3, chloride 105, bicarb 26, BUN 21, creatinine 0.7, glucose 119.  UA pending.  Lactic acid 2.1 > 1.2.  Blood cultures drawn.  INR 1.2.  COVID and influenza PCR negative.  Chest x-ray not suggestive of pneumonia. Patient was given vancomycin, cefepime, metronidazole, and 1 L normal saline bolus.  Informed by ED physician that he was not able to reach on-call oncologist despite multiple attempts.  Patient states she received her first chemo and immunotherapy treatment on Wednesday.  Yesterday she started having fever and chills.  Her temperature was 100.4 F at home and she took a tramadol.  She does not have a Port-A-Cath.  Reports chronic shortness of breath and cough due to COPD and uses 2 L oxygen at home.  Denies chest pain.  She felt nauseous yesterday but did not vomit.  Denies abdominal pain, diarrhea, or constipation.  She is having regular bowel movements.  Denies any skin changes or rash.  Denies urinary symptoms.  Review of Systems:  Review of Systems  All other systems reviewed and are negative.   Past Medical History:  Diagnosis Date   Alcoholism in  remission (HCC) 08/27/2007   Anemia    Arthritis    OA   Breast cancer (HCC)    left breast   COPD 12/17/2006   Diverticulosis 03/27/2007   GERD 05/01/2007   HYPERGLYCEMIA 08/27/2007   HYPERLIPIDEMIA 12/17/2006   HYPERTENSION 12/17/2006   Mood disorder (HCC)    hx anxiety and depression. meds short term during life transition   Pneumonia    AS A CHILD   Rectal polyp 03/27/2007   adenoma   SOB (shortness of breath) on exertion    per patient due to having COPD   TIA (transient ischemic attack)     Past Surgical History:  Procedure Laterality Date   ABDOMINAL HYSTERECTOMY  1978   fibroma   APPENDECTOMY  2002   BREAST EXCISIONAL BIOPSY Right 2003   negative   BREAST LUMPECTOMY WITH RADIOACTIVE SEED LOCALIZATION Left 03/23/2020   Procedure: LEFT BREAST LUMPECTOMY WITH RADIOACTIVE SEED LOCALIZATION;  Surgeon: Almond Lint, MD;  Location: [facility] OR;  Service: General;  Laterality: Left;  RNFA   CARPAL TUNNEL RELEASE Left 2010 or 2011   CATARACT EXTRACTION W/ INTRAOCULAR LENS  IMPLANT, BILATERAL Bilateral    COLONOSCOPY W/ POLYPECTOMY  03/27/2007; n7/19/2012   2008: 4 mm adenoma, diverticulosis 2012: ileitis ? NSAID - likely, 2-3 mm cecal polyp LYMPHOID FOLLICLE, diverticulosis   ESOPHAGOGASTRODUODENOSCOPY  01/26/2011   reflux esophagitis, colonscopy done also   HAMMER TOE SURGERY  2008   left foot   HERNIA REPAIR Right 1996?   IR ANGIO INTRA EXTRACRAN SEL COM CAROTID INNOMINATE BILAT MOD SED  10/25/2021  IR ANGIO INTRA EXTRACRAN SEL COM CAROTID INNOMINATE UNI R MOD SED  11/28/2018   IR ANGIO INTRA EXTRACRAN SEL COM CAROTID INNOMINATE UNI R MOD SED  03/17/2021   IR ANGIO INTRA EXTRACRAN SEL INTERNAL CAROTID UNI R MOD SED  12/30/2018   IR ANGIO VERTEBRAL SEL SUBCLAVIAN INNOMINATE UNI R MOD SED  11/28/2018   IR ANGIO VERTEBRAL SEL SUBCLAVIAN INNOMINATE UNI R MOD SED  10/26/2021   IR ANGIO VERTEBRAL SEL VERTEBRAL UNI R MOD SED  03/18/2021   IR ANGIOGRAM FOLLOW UP STUDY  12/30/2018   IR  TRANSCATH/EMBOLIZ  12/30/2018   IR US GUIDE VASC ACCESS RIGHT  11/28/2018   IR US GUIDE VASC ACCESS RIGHT  03/17/2021   ORIF ANKLE FRACTURE Right 12/22/2014   Procedure: OPEN REDUCTION INTERNAL FIXATION (ORIF) ANKLE FRACTURE;  Surgeon: Jene Every, MD;  Location: [facility];  Service: Orthopedics;  Laterality: Right;   RADIOLOGY WITH ANESTHESIA N/A 12/30/2018   Procedure: RADIOLOGY WITH ANESTHESIA;  Surgeon: Julieanne Cotton, MD;  Location: [facility] OR;  Service: Radiology;  Laterality: N/A;   SHOULDER OPEN ROTATOR CUFF REPAIR Left 03/06/2013   Procedure: LEFT ROTATOR CUFF REPAIR, SUBACROMIAL DECOMPRESSION, PATCH GRAFT, MANIPULATION UNDER ANESTHESIA;  Surgeon: Javier Docker, MD;  Location: [facility];  Service: Orthopedics;  Laterality: Left;   TONSILLECTOMY       reports that she quit smoking about 37 years ago. Her smoking use included cigarettes. She has a 60.00 pack-year smoking history. She has never used smokeless tobacco. She reports that she does not drink alcohol and does not use drugs.  Allergies  Allergen Reactions   Alcohol Other (See Comments)    Recovering Alcoholic*   Cheese Nausea And Vomiting   Dilaudid [Hydromorphone Hcl]     Pt had hypotension after dilaudid 1mg  IV while in the OR, required temporary pressor intervention.    Family History  Problem Relation Age of Onset   Throat cancer Mother        31   Heart attack Father 41   Idiopathic pulmonary fibrosis Brother 75   Cancer Brother        sarcoma   Heart disease Brother    Colon cancer Neg Hx    Esophageal cancer Neg Hx    Stomach cancer Neg Hx     Prior to Admission medications   Medication Sig Start Date End Date Taking? Authorizing Provider  acetaminophen (TYLENOL) 650 MG CR tablet Take 1,300 mg by mouth 2 (two) times daily.   Yes [provider]  albuterol (PROAIR HFA) 108 (90 Base) MCG/ACT inhaler INHALE 2 PUFFS EVERY 4 HOURS AS NEEDED FOR COUGHING SPELLS 05/24/20  Yes Shelva Majestic, MD  aspirin  325 MG tablet Take 1 tablet (325 mg total) by mouth daily. 01/01/19  Yes Louk, Alexandra M, PA-C  azelastine (ASTELIN) 0.1 % nasal spray Place 2 sprays into both nostrils 2 (two) times daily. Use in each nostril as directed 10/28/21  Yes Cobb, Ruby Cola, NP  benzonatate (TESSALON) 200 MG capsule Take 1 capsule (200 mg total) by mouth 3 (three) times daily as needed for cough. 10/28/21  Yes Cobb, Ruby Cola, NP  Cholecalciferol (VITAMIN D) 50 MCG (2000 UT) tablet Take 2,000 Units by mouth daily.   Yes [provider]  clopidogrel (PLAVIX) 75 MG tablet Take 0.5 tablets (37.5 mg total) by mouth daily. 08/03/21  Yes Shelva Majestic, MD  Coenzyme Q10 300 MG CAPS Take 300 mg by mouth daily.   Yes [provider]  fexofenadine (ALLEGRA) 180 MG tablet Take 180 mg by mouth daily.   Yes [provider]  fluticasone (FLONASE) 50 MCG/ACT nasal spray Place 2 sprays into both nostrils daily. 09/24/18  Yes Leslye Peer, MD  folic acid (FOLVITE) 1 MG tablet Take 1 tablet (1 mg total) by mouth daily. 12/21/21  Yes Heilingoetter, Cassandra L, PA-C  Glycopyrrolate-Formoterol (BEVESPI AEROSPHERE) 9-4.8 MCG/ACT AERO Inhale 2 puffs into the lungs 2 (two) times daily. 10/12/21  Yes Leslye Peer, MD  hydrochlorothiazide (HYDRODIURIL) 25 MG tablet Take 0.5 tablets (12.5 mg total) by mouth daily. 08/03/21  Yes Shelva Majestic, MD  montelukast (SINGULAIR) 10 MG tablet Take 1 tablet (10 mg total) by mouth at bedtime. 10/28/21  Yes Cobb, Ruby Cola, NP  Multiple Vitamin (MULTIVITAMIN WITH MINERALS) TABS tablet Take 1 tablet by mouth daily.   Yes [provider]  pantoprazole (PROTONIX) 40 MG tablet Take 1 tablet (40 mg total) by mouth daily. 10/28/21  Yes Cobb, Ruby Cola, NP  potassium chloride SA (KLOR-CON M) 20 MEQ tablet TAKE 1 TABLET BY MOUTH EVERY OTHER DAY Patient taking differently: Take 20 mEq by mouth every other day. 12/27/21  Yes Shelva Majestic, MD  Propylene Glycol (SYSTANE  BALANCE OP) Place 1 drop into both eyes daily.   Yes [provider]  rosuvastatin (CRESTOR) 20 MG tablet Take 1 tablet (20 mg total) by mouth daily. 08/03/21  Yes Shelva Majestic, MD  tamoxifen (NOLVADEX) 20 MG tablet Take 1 tablet (20 mg total) by mouth daily. 09/12/21  Yes Pollyann Samples, NP  traMADol (ULTRAM) 50 MG tablet TAKE ONE TABLET BY MOUTH EVERY 8 HOURS AS NEEDED FOR PAIN FOR UP TO 5 DAYS (DO NOT DRIVE FOR 8 HOURS AFTER TAKING) 08/15/21  Yes Shelva Majestic, MD  traZODone (DESYREL) 50 MG tablet TAKE 1 TABLET BY MOUTH EVERY NIGHT AT BEDTIME AS NEEDED FOR SLEEP Patient taking differently: Take 50 mg by mouth at bedtime. 08/15/21  Yes Shelva Majestic, MD  vitamin B-12 (CYANOCOBALAMIN) 1000 MCG tablet Take 1,000 mcg by mouth daily.   Yes [provider]  prochlorperazine (COMPAZINE) 10 MG tablet Take 1 tablet (10 mg total) by mouth every 6 (six) hours as needed. Patient taking differently: Take 10 mg by mouth every 6 (six) hours as needed for nausea. 12/21/21   Heilingoetter, Cassandra L, PA-C    Physical Exam: Vitals:   12/29/21 2030 12/29/21 2057 12/29/21 2100 12/30/21 0100  BP: (!) 141/59 (!) 158/74 (!) 158/74 122/68  Pulse: 95 99 97 93  Resp: 20 18 18  (!) 22  Temp:      TempSrc:      SpO2: 93% 91% 92% 93%  Weight:      Height:        Physical Exam Vitals reviewed.  Constitutional:      General: She is not in acute distress. HENT:     Head: Normocephalic and atraumatic.  Eyes:     Extraocular Movements: Extraocular movements intact.  Cardiovascular:     Rate and Rhythm: Normal rate and regular rhythm.     Pulses: Normal pulses.  Pulmonary:     Effort: Pulmonary effort is normal. No respiratory distress.     Breath sounds: Normal breath sounds. No wheezing or rales.  Abdominal:     General: Bowel sounds are normal. There is no distension.     Palpations: Abdomen is soft.     Tenderness: There is no abdominal tenderness.  Musculoskeletal:         General: No swelling or tenderness.     Cervical back: Normal range of motion. No rigidity.  Skin:    General: Skin is warm and dry.  Neurological:     General: No focal deficit present.     Mental Status: She is alert and oriented to person, place, and time.      Labs on Admission: I have personally reviewed following labs and imaging studies  CBC: Recent Labs  Lab 12/28/21 1054 12/29/21 1750  WBC 8.0 9.4  NEUTROABS 6.0 9.1*  HGB 13.4 13.2  HCT 39.5 39.9  MCV 92.7 95.9  PLT 205 160   Basic Metabolic Panel: Recent Labs  Lab 12/28/21 1054 12/29/21 1750  NA 140 138  K 4.1 4.3  CL 107 105  CO2 27 26  GLUCOSE 99 119*  BUN 17 21  CREATININE 0.74 0.79  CALCIUM 9.8 9.4   GFR: Estimated Creatinine Clearance: 52.4 mL/min (by C-G formula based on SCr of 0.79 mg/dL). Liver Function Tests: Recent Labs  Lab 12/28/21 1054 12/29/21 1750  AST 16 24  ALT 9 16  ALKPHOS 47 40  BILITOT 0.5 0.5  PROT 6.5 6.4*  ALBUMIN 3.7 3.5   No results for input(s): "LIPASE", "AMYLASE" in the last 168 hours. No results for input(s): "AMMONIA" in the last 168 hours. Coagulation Profile: Recent Labs  Lab 12/29/21 1917  INR 1.2   Cardiac Enzymes: No results for input(s): "CKTOTAL", "CKMB", "CKMBINDEX", "TROPONINI" in the last 168 hours. BNP (last 3 results) No results for input(s): "PROBNP" in the last 8760 hours. HbA1C: No results for input(s): "HGBA1C" in the last 72 hours. CBG: No results for input(s): "GLUCAP" in the last 168 hours. Lipid Profile: No results for input(s): "CHOL", "HDL", "LDLCALC", "TRIG", "CHOLHDL", "LDLDIRECT" in the last 72 hours. Thyroid Function Tests: Recent Labs    12/28/21 1054  TSH 1.927   Anemia Panel: No results for input(s): "VITAMINB12", "FOLATE", "FERRITIN", "TIBC", "IRON", "RETICCTPCT" in the last 72 hours. Urine analysis:    Component Value Date/Time   BILIRUBINUR n 01/19/2012 1306   PROTEINUR n 01/19/2012 1306   UROBILINOGEN 0.2  01/19/2012 1306   NITRITE n 01/19/2012 1306   LEUKOCYTESUR Negative 01/19/2012 1306    Radiological Exams on Admission: I have personally reviewed images DG Chest 2 View  Result Date: 12/29/2021 CLINICAL DATA:  History of lung cancer. Status post chemotherapy. Presents with chills. EXAM: CHEST - 2 VIEW COMPARISON:  10/28/2021. FINDINGS: Heart size and mediastinal contours are unremarkable. Small left pleural effusion. No airspace consolidation. Known left lower lobe lung nodule is again noted measuring 2.5 cm. Asymmetric increase interstitial prominence is noted within the left lung which appears similar to the previous imaging. No signs of atelectasis or pneumothorax. The visualized osseous structures appear intact. IMPRESSION: 1. No acute cardiopulmonary abnormalities. 2. Left lung nodule compatible with known lung cancer. 3. Unchanged small left pleural effusion with asymmetric interstitial prominence within the left lung. Electronically Signed   By: Signa Kell M.D.   On: 12/29/2021 17:42    EKG: Independently reviewed.  Sinus rhythm with first-degree AV block, RBBB.  No significant change compared to prior tracings.  Assessment and Plan  Fever/concern for severe sepsis Patient with history of recently diagnosed stage IV non-small cell lung cancer and received first chemo on 6/21 presented to the ED a day later complaining of fever with temperature 100.4 F at home and chills.  Slightly tachycardic on arrival to  the ED.  WBC 9.4. Lactic acid 2.1 > 1.2 after 1 L IV fluid bolus.  Source of infection unknown.  She does not have a Port-A-Cath.  Chest x-ray not suggestive of pneumonia.  COVID and influenza PCR negative.  Not endorsing any UTI symptoms.  No meningeal signs. -Continue broad-spectrum antibiotics including vancomycin, cefepime, and metronidazole at this time. -Continue IV fluid hydration -Acetaminophen as needed for fever -UA pending -Blood cultures pending -Check procalcitonin  level -Monitor WBC count -Consult oncology in the morning.  Stage IV non-small cell lung cancer Diagnosed in May 2023 received first chemo treatment on 6/21. -Consult oncology in the morning given concern for infection.  Stage Ia left breast cancer Diagnosed in August 2021 status post left breast lumpectomy/radioactive seed localization/adjuvant radiotherapy. -Continue tamoxifen  Nausea Likely related to cancer treatment.  Low suspicion for intra abdominal source of infection as patient is not vomiting and not endorsing any abdominal pain.  Abdominal exam is benign. -Antiemetic as needed  COPD Stable, no signs of acute exacerbation. -Continue home meds  GERD -Continue Protonix  Hyperlipidemia -Continue Crestor  Hypertension Currently normotensive. -Avoid antihypertensives at this time given concern for severe sepsis  History of TIA -Continue aspirin, Plavix, statin  DVT prophylaxis: Lovenox Code Status: DNR (discussed with the patient) Family Communication: No family available at this time. Level of care: Telemetry bed Admission status: It is my clinical opinion that admission to INPATIENT is reasonable and necessary because of the expectation that this patient will require hospital care that crosses at least 2 midnights to treat this condition based on the medical complexity of the problems presented.  Given the aforementioned information, the predictability of an adverse outcome is felt to be significant.   John Giovanni MD [facility]  If 7PM-7AM, please contact night-coverage [email or web address]  12/30/2021, 2:23 AM

## 2021-12-30 NOTE — Plan of Care (Signed)

## 2021-12-30 NOTE — Progress Notes (Addendum)
Subjective: Patient admitted this morning, see detailed H&P by Dr Loney Loh 87 year old female with a history of stage I left breast cancer diagnosed in 2021, Sheila/p left breast lumpectomyradioactive seed localization/adjuvant radiotherapy currently on tamoxifen, stage IV non-small cell lung cancer diagnosed in May 2023 received first chemo treatment on 6/21, alcoholism in remission, COPD, GERD, hyperlipidemia, hypertension, anxiety, depression, TIA.  She presented to the ED for evaluation of fever with temperature 100.8 F at home, chills, and nausea.   Patient said that she received first chemo antipain therapy treatment on Wednesday.  Yesterday she started having fever and chills.  Temperature 100.4 at home.  She took tramadol.  Does not have a Port-A-Cath.  Denies chest pain.  Chest x-ray noted history of pneumonia.  She was given vancomycin, cefepime, metronidazole.  Vitals:   12/30/21 1527 12/30/21 2003  BP:  (!) 104/49  Pulse:  72  Resp:  20  Temp: 100.2 F (37.9 C) 99.7 F (37.6 C)  SpO2:  92%      A/P Fever -Unclear etiology -Lactic acid 2.1, was 1.2 after IV fluid bolus -Chest x-ray notes history of pneumonia, UA is clear -Started on empiric antibiotics, vancomycin, cefepime, Flagyl -Follow blood culture results -Procalcitonin 0.15  Stage IV non-small cell lung cancer -Diagnosed in May 2023, received first chemo treatment on 6/21  Stage Ia left breast cancer -Diagnosed in August 2021, Sheila/p left breast lumpectomy/radioactive seed localization/adjuvant radiotherapy -Continue tamoxifen  Nausea -Likely from chemotherapy -Continue as needed antiemetics  Hypertension -Blood pressure is soft -Antihypertensive medications on hold    Sheila Shields Sheila Shields [facility]

## 2021-12-31 DIAGNOSIS — R509 Fever, unspecified: Secondary | ICD-10-CM

## 2021-12-31 DIAGNOSIS — A419 Sepsis, unspecified organism: Secondary | ICD-10-CM | POA: Diagnosis not present

## 2021-12-31 DIAGNOSIS — C3491 Malignant neoplasm of unspecified part of right bronchus or lung: Secondary | ICD-10-CM

## 2021-12-31 DIAGNOSIS — J410 Simple chronic bronchitis: Secondary | ICD-10-CM | POA: Diagnosis not present

## 2021-12-31 LAB — COMPREHENSIVE METABOLIC PANEL
ALT: 44 U/L (ref 0–44)
AST: 64 U/L — ABNORMAL HIGH (ref 15–41)
Albumin: 2.5 g/dL — ABNORMAL LOW (ref 3.5–5.0)
Alkaline Phosphatase: 52 U/L (ref 38–126)
Anion gap: 7 (ref 5–15)
BUN: 16 mg/dL (ref 8–23)
CO2: 25 mmol/L (ref 22–32)
Calcium: 8.4 mg/dL — ABNORMAL LOW (ref 8.9–10.3)
Chloride: 103 mmol/L (ref 98–111)
Creatinine, Ser: 0.68 mg/dL (ref 0.44–1.00)
GFR, Estimated: >60 mL/min (ref >60)
Glucose, Bld: 102 mg/dL — ABNORMAL HIGH (ref 70–99)
Potassium: 3.6 mmol/L (ref 3.5–5.1)
Sodium: 135 mmol/L (ref 135–145)
Total Bilirubin: 2.2 mg/dL — ABNORMAL HIGH (ref 0.3–1.2)
Total Protein: 5 g/dL — ABNORMAL LOW (ref 6.5–8.1)

## 2021-12-31 LAB — CBC
HCT: 38.5 % (ref 36.0–46.0)
Hemoglobin: 12.9 g/dL (ref 12.0–15.0)
MCH: 32.4 pg (ref 26.0–34.0)
MCHC: 33.5 g/dL (ref 30.0–36.0)
MCV: 96.7 fL (ref 80.0–100.0)
Platelets: 111 10*3/uL — ABNORMAL LOW (ref 150–400)
RBC: 3.98 MIL/uL (ref 3.87–5.11)
RDW: 13.4 % (ref 11.5–15.5)
WBC: 5.8 10*3/uL (ref 4.0–10.5)
nRBC: 0 % (ref 0.0–0.2)

## 2021-12-31 LAB — URINE CULTURE: Culture: 2000 — AB

## 2021-12-31 MED ORDER — LIP MEDEX EX OINT
1.0000 | TOPICAL_OINTMENT | CUTANEOUS | Status: DC | PRN
  Filled 2021-12-31: qty 7

## 2021-12-31 MED ORDER — FUROSEMIDE 40 MG PO TABS
40.0000 mg | ORAL_TABLET | Freq: Every day | ORAL | Status: DC
  Administered 2021-12-31 – 2022-01-01 (×2): 40 mg via ORAL
  Filled 2021-12-31 (×2): qty 1

## 2021-12-31 NOTE — Progress Notes (Signed)
[facility]  PROGRESS NOTE  Sheila Shields YNW:[id number] DOB: 1934-07-13 DOA: 12/29/2021 PCP: Shelva Majestic, MD   Brief HPI:   87 year old female with a history of stage I left breast cancer diagnosed in 2021, s/p left breast lumpectomyradioactive seed localization/adjuvant radiotherapy currently on tamoxifen, stage IV non-small cell lung cancer diagnosed in May 2023 received first chemo treatment on 6/21, alcoholism in remission, COPD, GERD, hyperlipidemia, hypertension, anxiety, depression, TIA.  She presented to the ED for evaluation of fever with temperature 100.8 F at home, chills, and nausea.    Patient said that she received first chemo antipain therapy treatment on Wednesday.  Yesterday she started having fever and chills.  Temperature 100.4 at home.  She took tramadol.  Does not have a Port-A-Cath.  Denies chest pain.  Chest x-ray noted history of pneumonia.  She was given vancomycin, cefepime, metronidazole.    Subjective   Patient seen and examined, still gets dyspnea on exertion.  Has been afebrile.   Assessment/Plan:    Fever -Unclear etiology -Lactic acid 2.1, was 1.2 after IV fluid bolus -Chest x-ray does not show  pneumonia, UA is clear -Procalcitonin 0.15, lactic acid 1.2 -Started on empiric antibiotics, vancomycin, cefepime, Flagyl -We will discontinue antibiotics -Discussed with Dr. Bertis Ruddy  Acute on chronic diastolic CHF -Patient has grade 1 diastolic dysfunction as per echo from 12/21 -Start Lasix 40 mg p.o. daily -Follow BMP in am   Stage IV non-small cell lung cancer -Diagnosed in May 2023, received first chemo treatment on 6/21   Stage Ia left breast cancer -Diagnosed in August 2021, s/p left breast lumpectomy/radioactive seed localization/adjuvant radiotherapy -Continue tamoxifen   Nausea -Likely from chemotherapy -Continue as needed antiemetics   Hypertension -Blood pressure is stable -Antihypertensive medications currently on  hold     Medications     arformoterol  15 mcg Nebulization BID   And   umeclidinium bromide  1 puff Inhalation Daily   aspirin  325 mg Oral Daily   cholecalciferol  2,000 Units Oral Daily   clopidogrel  37.5 mg Oral Daily   enoxaparin (LOVENOX) injection  40 mg Subcutaneous Q24H   fluticasone  2 spray Each Nare Daily   folic acid  1 mg Oral Daily   furosemide  40 mg Oral Daily   montelukast  10 mg Oral QHS   multivitamin with minerals  1 tablet Oral Daily   pantoprazole  40 mg Oral Daily   rosuvastatin  20 mg Oral Daily   tamoxifen  20 mg Oral Daily   vitamin B-12  1,000 mcg Oral Daily     Data Reviewed:   CBG:  No results for input(s): "GLUCAP" in the last 168 hours.  SpO2: 90 % O2 Flow Rate (L/min): 4.5 L/min    Vitals:   12/31/21 0842 12/31/21 1000 12/31/21 1219 12/31/21 1315  BP:   111/72 (!) 106/49  Pulse:   99 76  Resp:   20 16  Temp:   98.6 F (37 C) 97.7 F (36.5 C)  TempSrc:   Oral Oral  SpO2: 90% 90% 99% 90%  Weight:      Height:          Data Reviewed:  Basic Metabolic Panel: Recent Labs  Lab 12/28/21 1054 12/29/21 1750 12/30/21 0519 12/31/21 0536  NA 140 138 137 135  K 4.1 4.3 4.0 3.6  CL 107 105 104 103  CO2 27 26 24 25   GLUCOSE 99 119* 118* 102*  BUN 17 21  18 16  CREATININE 0.74 0.79 0.72 0.68  CALCIUM 9.8 9.4 8.5* 8.4*    CBC: Recent Labs  Lab 12/28/21 1054 12/29/21 1750 12/30/21 0519 12/31/21 0536  WBC 8.0 9.4 6.1 5.8  NEUTROABS 6.0 9.1* 5.9  --   HGB 13.4 13.2 12.4 12.9  HCT 39.5 39.9 38.3 38.5  MCV 92.7 95.9 97.2 96.7  PLT 205 160 137* 111*    LFT Recent Labs  Lab 12/28/21 1054 12/29/21 1750 12/30/21 0519 12/31/21 0536  AST 16 24 51* 64*  ALT 9 16 29  44  ALKPHOS 47 40 36* 52  BILITOT 0.5 0.5 1.2 2.2*  PROT 6.5 6.4* 5.5* 5.0*  ALBUMIN 3.7 3.5 2.9* 2.5*     Antibiotics: Anti-infectives (From admission, onward)    Start     Dose/Rate Route Frequency Ordered Stop   12/30/21 2200  vancomycin  (VANCOCIN) IVPB 1000 mg/200 mL premix  Status:  Discontinued        1,000 mg 200 mL/hr over 60 Minutes Intravenous Every 24 hours 12/30/21 0101 12/31/21 0929   12/30/21 0800  ceFEPIme (MAXIPIME) 2 g in sodium chloride 0.9 % 100 mL IVPB  Status:  Discontinued        2 g 200 mL/hr over 30 Minutes Intravenous Every 12 hours 12/30/21 0101 12/31/21 0929   12/30/21 0600  metroNIDAZOLE (FLAGYL) IVPB 500 mg  Status:  Discontinued        500 mg 100 mL/hr over 60 Minutes Intravenous Every 12 hours 12/30/21 0256 12/31/21 1359   12/29/21 1830  vancomycin (VANCOREADY) IVPB 1750 mg/350 mL        1,750 mg 175 mL/hr over 120 Minutes Intravenous  Once 12/29/21 1820 12/29/21 2146   12/29/21 1815  ceFEPIme (MAXIPIME) 2 g in sodium chloride 0.9 % 100 mL IVPB        2 g 200 mL/hr over 30 Minutes Intravenous  Once 12/29/21 1814 12/29/21 2054   12/29/21 1815  metroNIDAZOLE (FLAGYL) IVPB 500 mg        500 mg 100 mL/hr over 60 Minutes Intravenous  Once 12/29/21 1814 12/29/21 2055   12/29/21 1815  vancomycin (VANCOCIN) IVPB 1000 mg/200 mL premix  Status:  Discontinued        1,000 mg 200 mL/hr over 60 Minutes Intravenous  Once 12/29/21 1814 12/29/21 1817        DVT prophylaxis: Lovenox  Code Status: Full code  Family Communication: No family at bedside   CONSULTS    Objective    Physical Examination:  General-appears in no acute distress Heart-S1-S2, regular, no murmur auscultated Lungs-clear to auscultation bilaterally, no wheezing or crackles auscultated Abdomen-soft, nontender, no organomegaly Extremities-no edema in the lower extremities Neuro-alert, oriented x3, no focal deficit noted   Status is: Inpatient:          Meredeth Ide   [facility] If 7PM-7AM, please contact night-coverage at [email or web address], Office  [phone]   12/31/2021, 4:01 PM  LOS: 2 days

## 2022-01-01 ENCOUNTER — Inpatient Hospital Stay (HOSPITAL_COMMUNITY): Payer: Medicare HMO

## 2022-01-01 DIAGNOSIS — I10 Essential (primary) hypertension: Secondary | ICD-10-CM | POA: Diagnosis not present

## 2022-01-01 DIAGNOSIS — J410 Simple chronic bronchitis: Secondary | ICD-10-CM | POA: Diagnosis not present

## 2022-01-01 DIAGNOSIS — C3491 Malignant neoplasm of unspecified part of right bronchus or lung: Secondary | ICD-10-CM | POA: Diagnosis not present

## 2022-01-01 DIAGNOSIS — R509 Fever, unspecified: Secondary | ICD-10-CM | POA: Diagnosis not present

## 2022-01-01 LAB — BASIC METABOLIC PANEL
Anion gap: 7 (ref 5–15)
BUN: 21 mg/dL (ref 8–23)
CO2: 31 mmol/L (ref 22–32)
Calcium: 8.9 mg/dL (ref 8.9–10.3)
Chloride: 101 mmol/L (ref 98–111)
Creatinine, Ser: 0.77 mg/dL (ref 0.44–1.00)
GFR, Estimated: >60 mL/min (ref >60)
Glucose, Bld: 110 mg/dL — ABNORMAL HIGH (ref 70–99)
Potassium: 3.4 mmol/L — ABNORMAL LOW (ref 3.5–5.1)
Sodium: 139 mmol/L (ref 135–145)

## 2022-01-01 LAB — CBC WITH DIFFERENTIAL/PLATELET
Abs Immature Granulocytes: 0.02 10*3/uL (ref 0.00–0.07)
Basophils Absolute: 0 10*3/uL (ref 0.0–0.1)
Basophils Relative: 0 %
Eosinophils Absolute: 0.2 10*3/uL (ref 0.0–0.5)
Eosinophils Relative: 4 %
HCT: 39.7 % (ref 36.0–46.0)
Hemoglobin: 13.2 g/dL (ref 12.0–15.0)
Immature Granulocytes: 0 %
Lymphocytes Relative: 2 %
Lymphs Abs: 0.1 10*3/uL — ABNORMAL LOW (ref 0.7–4.0)
MCH: 32 pg (ref 26.0–34.0)
MCHC: 33.2 g/dL (ref 30.0–36.0)
MCV: 96.1 fL (ref 80.0–100.0)
Monocytes Absolute: 0.1 10*3/uL (ref 0.1–1.0)
Monocytes Relative: 1 %
Neutro Abs: 4.8 10*3/uL (ref 1.7–7.7)
Neutrophils Relative %: 93 %
Platelets: 116 10*3/uL — ABNORMAL LOW (ref 150–400)
RBC: 4.13 MIL/uL (ref 3.87–5.11)
RDW: 13.5 % (ref 11.5–15.5)
WBC: 5.2 10*3/uL (ref 4.0–10.5)
nRBC: 0 % (ref 0.0–0.2)

## 2022-01-01 LAB — BRAIN NATRIURETIC PEPTIDE: B Natriuretic Peptide: 332 pg/mL — ABNORMAL HIGH (ref 0.0–100.0)

## 2022-01-01 MED ORDER — POTASSIUM CHLORIDE CRYS ER 20 MEQ PO TBCR
40.0000 meq | EXTENDED_RELEASE_TABLET | Freq: Once | ORAL | Status: AC
Start: 2022-01-01 — End: 2022-01-01
  Administered 2022-01-01: 40 meq via ORAL
  Filled 2022-01-01: qty 2

## 2022-01-01 NOTE — Progress Notes (Signed)
O2 sat 87% on 4.5 liters of O2  while ambulating.

## 2022-01-02 ENCOUNTER — Ambulatory Visit: Payer: Medicare HMO | Admitting: Radiation Oncology

## 2022-01-02 DIAGNOSIS — J9621 Acute and chronic respiratory failure with hypoxia: Secondary | ICD-10-CM

## 2022-01-02 DIAGNOSIS — C3491 Malignant neoplasm of unspecified part of right bronchus or lung: Secondary | ICD-10-CM | POA: Diagnosis not present

## 2022-01-02 DIAGNOSIS — J410 Simple chronic bronchitis: Secondary | ICD-10-CM | POA: Diagnosis not present

## 2022-01-02 LAB — BASIC METABOLIC PANEL
Anion gap: 7 (ref 5–15)
BUN: 19 mg/dL (ref 8–23)
CO2: 33 mmol/L — ABNORMAL HIGH (ref 22–32)
Calcium: 8.9 mg/dL (ref 8.9–10.3)
Chloride: 100 mmol/L (ref 98–111)
Creatinine, Ser: 0.67 mg/dL (ref 0.44–1.00)
GFR, Estimated: >60 mL/min (ref >60)
Glucose, Bld: 96 mg/dL (ref 70–99)
Potassium: 3.9 mmol/L (ref 3.5–5.1)
Sodium: 140 mmol/L (ref 135–145)

## 2022-01-02 MED ORDER — BUDESONIDE 0.25 MG/2ML IN SUSP
0.2500 mg | Freq: Two times a day (BID) | RESPIRATORY_TRACT | Status: DC
  Administered 2022-01-02 – 2022-01-03 (×3): 0.25 mg via RESPIRATORY_TRACT
  Filled 2022-01-02 (×3): qty 2

## 2022-01-02 MED ORDER — IPRATROPIUM-ALBUTEROL 0.5-2.5 (3) MG/3ML IN SOLN
3.0000 mL | Freq: Three times a day (TID) | RESPIRATORY_TRACT | Status: DC
  Administered 2022-01-02 (×2): 3 mL via RESPIRATORY_TRACT
  Filled 2022-01-02 (×2): qty 3

## 2022-01-02 MED ORDER — METHYLPREDNISOLONE SODIUM SUCC 40 MG IJ SOLR
40.0000 mg | Freq: Two times a day (BID) | INTRAMUSCULAR | Status: DC
  Administered 2022-01-02 – 2022-01-03 (×3): 40 mg via INTRAVENOUS
  Filled 2022-01-02 (×3): qty 1

## 2022-01-02 MED ORDER — IPRATROPIUM-ALBUTEROL 0.5-2.5 (3) MG/3ML IN SOLN
3.0000 mL | Freq: Three times a day (TID) | RESPIRATORY_TRACT | Status: DC
Start: 2022-01-02 — End: 2022-01-03
  Administered 2022-01-02: 3 mL via RESPIRATORY_TRACT
  Filled 2022-01-02 (×2): qty 3

## 2022-01-03 ENCOUNTER — Telehealth: Payer: Self-pay

## 2022-01-03 ENCOUNTER — Ambulatory Visit: Payer: Medicare HMO | Admitting: Emergency Medicine

## 2022-01-03 ENCOUNTER — Ambulatory Visit: Payer: Medicare HMO

## 2022-01-03 DIAGNOSIS — C3491 Malignant neoplasm of unspecified part of right bronchus or lung: Secondary | ICD-10-CM | POA: Diagnosis not present

## 2022-01-03 LAB — CULTURE, BLOOD (ROUTINE X 2)
Culture: NO GROWTH
Special Requests: ADEQUATE

## 2022-01-03 MED ORDER — PREDNISONE 20 MG PO TABS: ORAL_TABLET | ORAL | 0 refills | Status: DC

## 2022-01-03 NOTE — Discharge Summary (Addendum)
[facility]  Physician Discharge Summary   Patient ID: Sheila Shields MRN: [id number] DOB/AGE: 85-Nov-1936 85 y.o.  Admit date: 12/29/2021 Discharge date:   01/03/2022   PCP: Marin Olp, MD  DISCHARGE DIAGNOSES:     Hyperlipemia   Essential hypertension   Gastroesophageal reflux disease   COPD (chronic obstructive pulmonary disease) (HCC)   Adenocarcinoma of right lung, stage 4 (HCC)   RECOMMENDATIONS FOR OUTPATIENT FOLLOW UP: Patient to follow-up with her medical oncologist.  She is also supposed to start radiation treatment sometime this week.   Home Health: None Equipment/Devices: None  CODE STATUS: DNR  DISCHARGE CONDITION: fair  Diet recommendation: As before  INITIAL HISTORY: 87 year old female with a history of stage I left breast cancer diagnosed in 2021, s/p left breast lumpectomyradioactive seed localization/adjuvant radiotherapy currently on tamoxifen, stage IV non-small cell lung cancer diagnosed in May 2023 received first chemo treatment on 6/21, alcoholism in remission, COPD, GERD, hyperlipidemia, hypertension, anxiety, depression, TIA.  She presented to the ED for evaluation of fever with temperature 100.8 F at home, chills, and nausea.  Started on broad-spectrum antibiotics and was hospitalized.    HOSPITAL COURSE:   Fever in the setting of chemotherapy Appears to have resolved.  No etiology found.  Blood cultures have been negative.  WBC is normal.  Procalcitonin was only 0.15. UA was equivocal.  Urine culture with no significant growth.  Chest x-ray did not show any pneumonia.  Initially started on broad-spectrum antibiotics with vancomycin cefepime and Flagyl.  After discussions with oncology these were discontinued. Fever appears to have resolved.  Acute on chronic respiratory failure with hypoxia/COPD with possible acute exacerbation Patient uses oxygen at home for history of COPD.  She usually uses 2 L/min.  Was requiring more oxygen  here in the hospital.  Noted to have wheezing which could be the reason for her worsening hypoxia.  She denies any chest pain.  No dizziness lightheadedness.  Her vital signs are stable.  Heart rate is normal.  Do not suspect venous thromboembolism.  No lower extremity edema of significance is noted. She was given nebulizer treatments and started on steroids.  She feels much better this morning and wants to go home.  Has been weaned down to 3 L of oxygen via nasal cannula.  She will be discharged on steroid taper.   Non-small cell lung cancer stage IV Diagnosed May 2023.  Received first chemo treatment on 6/21.  Followed by Dr. Julien Nordmann. Supposed to start radiation treatment soon.     Acute on chronic diastolic CHF She was given furosemide for a few days.  Seems to be stable.  Hypokalemia Repleted   History of left breast cancer This was diagnosed in 2021.  She is status post left breast lumpectomy and radioactive seed placement.  She also received adjuvant radiotherapy.  Noted to be on tamoxifen which has been continued.   Essential hypertension Continue home medications.   Patient is stable.  She is adamant about going home today.  Seems to be okay for discharge today.   PERTINENT LABS:  The results of significant diagnostics from this hospitalization (including imaging, microbiology, ancillary and laboratory) are listed below for reference.    Microbiology: Recent Results (from the past 240 hour(s))  Blood Culture (routine x 2)     Status: None   Collection Time: 12/29/21  6:57 PM   Specimen: BLOOD  Result Value Ref Range Status   Specimen Description   Final    BLOOD  LEFT ANTECUBITAL Performed at [facility] [address], [place], [place] [address]    Special Requests   Final    BOTTLES DRAWN AEROBIC AND ANAEROBIC Blood Culture adequate volume Performed at [facility] [address] [place], [place] [address]    Culture   Final     NO GROWTH 5 DAYS Performed at [facility], [place] [address], [place], [place] [address]    Report Status 01/03/2022 FINAL  Final  Resp Panel by RT-PCR (Flu A&B, Covid) Anterior Nasal Swab     Status: None   Collection Time: 12/29/21  7:33 PM   Specimen: Anterior Nasal Swab  Result Value Ref Range Status   SARS Coronavirus 2 by RT PCR NEGATIVE NEGATIVE Final    Comment: (NOTE) SARS-CoV-2 target nucleic acids are NOT DETECTED.  The SARS-CoV-2 RNA is generally detectable in upper respiratory specimens during the acute phase of infection. The lowest concentration of SARS-CoV-2 viral copies this assay can detect is 138 copies/mL. A negative result does not preclude SARS-Cov-2 infection and should not be used as the sole basis for treatment or other patient management decisions. A negative result may occur with  improper specimen collection/handling, submission of specimen other than nasopharyngeal swab, presence of viral mutation(s) within the areas targeted by this assay, and inadequate number of viral copies(<138 copies/mL). A negative result must be combined with clinical observations, patient history, and epidemiological information. The expected result is Negative.  Fact Sheet for Patients:  EntrepreneurPulse.com.au  Fact Sheet for Healthcare Providers:  IncredibleEmployment.be  This test is no t yet approved or cleared by the [place] FDA and  has been authorized for detection and/or diagnosis of SARS-CoV-2 by FDA under an Emergency Use Authorization (EUA). This EUA will remain  in effect (meaning this test can be used) for the duration of the COVID-19 declaration under Section 564(b)(1) of the Act, 21 U.S.C.section 360bbb-3(b)(1), unless the authorization is terminated  or revoked sooner.       Influenza A by PCR NEGATIVE NEGATIVE Final   Influenza B by PCR NEGATIVE NEGATIVE Final    Comment: (NOTE) The Xpert Xpress  SARS-CoV-2/FLU/RSV plus assay is intended as an aid in the diagnosis of influenza from Nasopharyngeal swab specimens and should not be used as a sole basis for treatment. Nasal washings and aspirates are unacceptable for Xpert Xpress SARS-CoV-2/FLU/RSV testing.  Fact Sheet for Patients: EntrepreneurPulse.com.au  Fact Sheet for Healthcare Providers: IncredibleEmployment.be  This test is not yet approved or cleared by the [place] FDA and has been authorized for detection and/or diagnosis of SARS-CoV-2 by FDA under an Emergency Use Authorization (EUA). This EUA will remain in effect (meaning this test can be used) for the duration of the COVID-19 declaration under Section 564(b)(1) of the Act, 21 U.S.C. section 360bbb-3(b)(1), unless the authorization is terminated or revoked.  Performed at [facility], [place] [address] [place], [place] [address]   Urine Culture     Status: Abnormal   Collection Time: 12/30/21  2:38 AM   Specimen: In/Out Cath Urine  Result Value Ref Range Status   Specimen Description   Final    IN/OUT CATH URINE Performed at [facility] [address] [place], [place] [address]    Special Requests   Final    NONE Performed at [facility], [place] [address], [place], [place] [address]    Culture (A)  Final    2,000 COLONIES/mL STAPHYLOCOCCUS  EPIDERMIDIS CALL MICROBIOLOGY LAB IF SENSITIVITIES ARE REQUIRED. Performed at [facility], [place] [address], [place], [place] [address]    Report Status 12/31/2021 FINAL  Final     Labs:   Basic Metabolic Panel: Recent Labs  Lab 12/29/21 1750 12/30/21 0519 12/31/21 0536 01/01/22 0554 01/02/22 0535  NA 138 137 135 139 140  K 4.3 4.0 3.6 3.4* 3.9  CL 105 104 103 101 100  CO2 26 24 25 31  33*  GLUCOSE 119* 118* 102* 110* 96  BUN 21 18 16 21 19   CREATININE 0.79 0.72 0.68 0.77 0.67  CALCIUM 9.4 8.5* 8.4* 8.9  8.9   Liver Function Tests: Recent Labs  Lab 12/28/21 1054 12/29/21 1750 12/30/21 0519 12/31/21 0536  AST 16 24 51* 64*  ALT 9 16 29  44  ALKPHOS 47 40 36* 52  BILITOT 0.5 0.5 1.2 2.2*  PROT 6.5 6.4* 5.5* 5.0*  ALBUMIN 3.7 3.5 2.9* 2.5*    CBC: Recent Labs  Lab 12/28/21 1054 12/29/21 1750 12/30/21 0519 12/31/21 0536 01/01/22 0554  WBC 8.0 9.4 6.1 5.8 5.2  NEUTROABS 6.0 9.1* 5.9  --  4.8  HGB 13.4 13.2 12.4 12.9 13.2  HCT 39.5 39.9 38.3 38.5 39.7  MCV 92.7 95.9 97.2 96.7 96.1  PLT 205 160 137* 111* 116*    BNP: BNP (last 3 results) Recent Labs    01/01/22 0554  BNP 332.0*      IMAGING STUDIES DG Chest Port 1V same Day  Result Date: 01/01/2022 CLINICAL DATA:  Hypoxemia EXAM: PORTABLE CHEST 1 VIEW COMPARISON:  12/29/2021 FINDINGS: Progression of left lower lobe airspace disease. Probable small left effusion. Right lung clear.  Negative for heart failure. Progression of left lower lobe airspace disease. IMPRESSION: No active disease. Electronically Signed   By: Franchot Gallo M.D.   On: 01/01/2022 13:32   DG Chest 2 View  Result Date: 12/29/2021 CLINICAL DATA:  History of lung cancer. Status post chemotherapy. Presents with chills. EXAM: CHEST - 2 VIEW COMPARISON:  10/28/2021. FINDINGS: Heart size and mediastinal contours are unremarkable. Small left pleural effusion. No airspace consolidation. Known left lower lobe lung nodule is again noted measuring 2.5 cm. Asymmetric increase interstitial prominence is noted within the left lung which appears similar to the previous imaging. No signs of atelectasis or pneumothorax. The visualized osseous structures appear intact. IMPRESSION: 1. No acute cardiopulmonary abnormalities. 2. Left lung nodule compatible with known lung cancer. 3. Unchanged small left pleural effusion with asymmetric interstitial prominence within the left lung. Electronically Signed   By: Kerby Moors M.D.   On: 12/29/2021 17:42   MR BRAIN W WO  CONTRAST  Result Date: 12/18/2021 CLINICAL DATA:  Non-small cell lung cancer staging. Prior endovascular treatment of a cerebral aneurysm. EXAM: MRI HEAD WITHOUT AND WITH CONTRAST TECHNIQUE: Multiplanar, multiecho pulse sequences of the brain and surrounding structures were obtained without and with intravenous contrast. CONTRAST:  23mL GADAVIST GADOBUTROL 1 MMOL/ML IV SOLN COMPARISON:  Head MRI 07/27/2020 FINDINGS: Brain: There is no evidence of an acute infarct, intracranial hemorrhage, mass, midline shift, or extra-axial fluid collection. There is mild generalized cerebral atrophy. Scattered small T2 hyperintensities in the cerebral white matter bilaterally are unchanged and nonspecific but compatible with mild chronic small vessel ischemic disease. No abnormal enhancement is identified. Vascular: Major intracranial vascular flow voids are preserved. Susceptibility artifact along the right supraclinoid ICA secondary to pipeline flow diverter devices for prior aneurysm treatment. Skull and upper cervical spine: Unremarkable bone marrow  signal. Sinuses/Orbits: Bilateral cataract extraction. Paranasal sinuses and mastoid air cells are clear. Other: None. IMPRESSION: 1. No evidence of intracranial metastases. 2. Mild chronic small vessel ischemic disease. Electronically Signed   By: Logan Bores M.D.   On: 12/18/2021 14:32    DISCHARGE EXAMINATION: Vitals:   01/02/22 1500 01/02/22 2115 01/03/22 0518 01/03/22 0735  BP:  102/67 135/79   Pulse: 78 80 81   Resp: 20 (!) 22 18   Temp:  97.8 F (36.6 C) 97.9 F (36.6 C)   TempSrc:  Oral Oral   SpO2: 93% 91% 91% 96%  Weight:      Height:       General appearance: Awake alert.  In no distress Resp: Improved aeration today compared to yesterday.  Less wheezing.  No rhonchi. Cardio: S1-S2 is normal regular.  No S3-S4.  No rubs murmurs or bruit GI: Abdomen is soft.  Nontender nondistended.  Bowel sounds are present normal.  No masses  organomegaly Extremities: No edema.  Full range of motion of lower extremities. Neurologic: Alert and oriented x3.  No focal neurological deficits.    DISPOSITION: Home  Discharge Instructions     Call MD for:  difficulty breathing, headache or visual disturbances   Complete by: As directed    Call MD for:  extreme fatigue   Complete by: As directed    Call MD for:  persistant dizziness or light-headedness   Complete by: As directed    Call MD for:  persistant nausea and vomiting   Complete by: As directed    Call MD for:  severe uncontrolled pain   Complete by: As directed    Call MD for:  temperature >100.4   Complete by: As directed    Diet - low sodium heart healthy   Complete by: As directed    Discharge instructions   Complete by: As directed    Please take your medications as prescribed.  Please be sure to follow-up with your medical oncologist.  Please discuss with your primary care provider if you would benefit from a steroid inhaler.  You were cared for by a hospitalist during your hospital stay. If you have any questions about your discharge medications or the care you received while you were in the hospital after you are discharged, you can call the unit and asked to speak with the hospitalist on call if the hospitalist that took care of you is not available. Once you are discharged, your primary care physician will handle any further medical issues. Please note that NO REFILLS for any discharge medications will be authorized once you are discharged, as it is imperative that you return to your primary care physician (or establish a relationship with a primary care physician if you do not have one) for your aftercare needs so that they can reassess your need for medications and monitor your lab values. If you do not have a primary care physician, you can call [phone] for a physician referral.   Increase activity slowly   Complete by: As directed           Allergies as  of 01/03/2022       Reactions   Alcohol Other (See Comments)   Recovering Alcoholic*   Cheese Nausea And Vomiting   Dilaudid [hydromorphone Hcl]    Pt had hypotension after dilaudid 1mg  IV while in the OR, required temporary pressor intervention.        Medication List     STOP taking these  medications    hydrochlorothiazide 25 MG tablet Commonly known as: HYDRODIURIL   potassium chloride SA 20 MEQ tablet Commonly known as: KLOR-CON M       TAKE these medications    acetaminophen 650 MG CR tablet Commonly known as: TYLENOL Take 1,300 mg by mouth 2 (two) times daily.   albuterol 108 (90 Base) MCG/ACT inhaler Commonly known as: ProAir HFA INHALE 2 PUFFS EVERY 4 HOURS AS NEEDED FOR COUGHING SPELLS   aspirin 325 MG tablet Take 1 tablet (325 mg total) by mouth daily.   azelastine 0.1 % nasal spray Commonly known as: ASTELIN Place 2 sprays into both nostrils 2 (two) times daily. Use in each nostril as directed   benzonatate 200 MG capsule Commonly known as: TESSALON Take 1 capsule (200 mg total) by mouth 3 (three) times daily as needed for cough.   Bevespi Aerosphere 9-4.8 MCG/ACT Aero Generic drug: Glycopyrrolate-Formoterol Inhale 2 puffs into the lungs 2 (two) times daily.   clopidogrel 75 MG tablet Commonly known as: PLAVIX Take 0.5 tablets (37.5 mg total) by mouth daily.   Coenzyme Q10 300 MG Caps Take 300 mg by mouth daily.   fexofenadine 180 MG tablet Commonly known as: ALLEGRA Take 180 mg by mouth daily.   fluticasone 50 MCG/ACT nasal spray Commonly known as: FLONASE Place 2 sprays into both nostrils daily.   folic acid 1 MG tablet Commonly known as: FOLVITE Take 1 tablet (1 mg total) by mouth daily.   montelukast 10 MG tablet Commonly known as: SINGULAIR Take 1 tablet (10 mg total) by mouth at bedtime.   multivitamin with minerals Tabs tablet Take 1 tablet by mouth daily.   pantoprazole 40 MG tablet Commonly known as: Protonix Take 1  tablet (40 mg total) by mouth daily.   predniSONE 20 MG tablet Commonly known as: DELTASONE Take 3 tablets once daily for 3 days followed by 2 tablets once daily for 3 days followed by 1 tablet once daily for 3 days and then stop   prochlorperazine 10 MG tablet Commonly known as: COMPAZINE Take 1 tablet (10 mg total) by mouth every 6 (six) hours as needed. What changed: reasons to take this   rosuvastatin 20 MG tablet Commonly known as: CRESTOR Take 1 tablet (20 mg total) by mouth daily.   SYSTANE BALANCE OP Place 1 drop into both eyes daily.   tamoxifen 20 MG tablet Commonly known as: NOLVADEX Take 1 tablet (20 mg total) by mouth daily.   traMADol 50 MG tablet Commonly known as: ULTRAM TAKE ONE TABLET BY MOUTH EVERY 8 HOURS AS NEEDED FOR PAIN FOR UP TO 5 DAYS (DO NOT DRIVE FOR 8 HOURS AFTER TAKING)   traZODone 50 MG tablet Commonly known as: DESYREL TAKE 1 TABLET BY MOUTH EVERY NIGHT AT BEDTIME AS NEEDED FOR SLEEP What changed:  how much to take how to take this when to take this additional instructions   vitamin B-12 1000 MCG tablet Commonly known as: CYANOCOBALAMIN Take 1,000 mcg by mouth daily.   Vitamin D 50 MCG (2000 UT) tablet Take 2,000 Units by mouth daily.          Follow-up Information     Curt Bears, MD Follow up.   Specialty: Oncology Contact information: [place] [address] [phone]                 TOTAL DISCHARGE TIME: 64 minutes  [facility]  [facility] Pager on [email or web address]  01/03/2022, 11:13 AM

## 2022-01-03 NOTE — Progress Notes (Signed)
[facility] OFFICE PROGRESS NOTE  Marin Olp, MD [address] [place] [address]  DIAGNOSIS: 1) Stage IV (T1c, N3, M1C) non-small cell lung cancer, adenocarcinoma presented with left lower lobe lung nodule in addition to bilateral mediastinal lymphadenopathy as well as bilateral pulmonary nodules and multiple metastatic bone lesions diagnosed in May 2023. 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   Guardant 360: No actionable mutations   Foundation one: Positive for KRASG12C   PDL1: 70%  PRIOR THERAPY:  1) status post left breast lumpectomy followed by radioactive seed localization and adjuvant radiotherapy and currently on tamoxifen.  CURRENT THERAPY: 1 ) palliative systemic chemotherapy with carboplatin for an AUC of 5, Alimta 500 mg per metered square, Keytruda 200 mg IV every 3 weeks.  First dose expected 12/28/2021 2) palliative radiation to the painful metastatic bone lesions (hip and below shoulder blade) under the care of Dr. Lisbeth Renshaw    INTERVAL HISTORY: TYLEIGH MAHN 87 y.o. female returns to the clinic today for a follow-up visit accompanied by her husband.  The patient was recently diagnosed with lung cancer.  She underwent her first cycle of treatment on 12/28/2021.  The day following her infusion she had some fever and chills.  She presented to the emergency room.  She was admitted to the hospital from 12/29/2021-01/03/2022.  She had a UA, blood culture, and a chest x-ray, none of which showed a clear etiology of her fever.  She initially was started on broad-spectrum antibiotics with Vanco, cefepime, and Flagyl but this was discontinued.  Her fever has resolved. Her Tmax was 100.8. She was also requiring more oxygen in the hospital.  She also had some  acute on chronic respiratory failure with hypoxia and COPD and possible acute exacerbation.  She was given nebulizer treatments and discharged home on a steroid taper which she just started.   Overall, the patient is feeling fatigued today as she was busy/active upon returning home yesterday.  In general she states she feels better but not at her baseline.  She denies any more fevers or chills.  She still continues to have a low appetite.  She is drinking protein drinks.  She admits she is not able to drink as much water as encouraged due to not being a big water drinker.  She reports she has not been coughing as much today since starting the prednisone.  She is on supplemental oxygen for her shortness of breath with 4 L.  She denies any nausea, vomiting, diarrhea, or constipation.  Denies any unusual headache or visual changes.  Denies any rashes or skin changes except for some bruising from her IV site in the hospital.  She was supposed to have her port and treat for her radiation today but it has been delayed until tomorrow due to insurance authorization.  The patient takes 650 mg of Tylenol twice daily for arthritis.  She may take a tramadol 1 time per day if needed for breakthrough pain.  She is here today for evaluation of 1 week follow-up visit to manage any adverse side effects of treatment.    MEDICAL HISTORY: Past Medical History:  Diagnosis Date   Alcoholism in remission ([facility]) 08/27/2007   Anemia    Arthritis    OA   Breast cancer ([facility])  left breast   COPD 12/17/2006   Diverticulosis 03/27/2007   GERD 05/01/2007   HYPERGLYCEMIA 08/27/2007   HYPERLIPIDEMIA 12/17/2006   HYPERTENSION 12/17/2006   Mood disorder ([facility])    hx anxiety and depression. meds short term during life transition   Pneumonia    AS A CHILD   Rectal polyp 03/27/2007   adenoma   SOB (shortness of breath) on exertion    per patient due to having COPD   TIA (transient ischemic attack)     ALLERGIES:  is allergic to  alcohol, cheese, and dilaudid [hydromorphone hcl].  MEDICATIONS:  Current Outpatient Medications  Medication Sig Dispense Refill   acetaminophen (TYLENOL) 650 MG CR tablet Take 1,300 mg by mouth 2 (two) times daily.     albuterol (PROAIR HFA) 108 (90 Base) MCG/ACT inhaler INHALE 2 PUFFS EVERY 4 HOURS AS NEEDED FOR COUGHING SPELLS 18 g 5   aspirin 325 MG tablet Take 1 tablet (325 mg total) by mouth daily. 30 tablet 3   azelastine (ASTELIN) 0.1 % nasal spray Place 2 sprays into both nostrils 2 (two) times daily. Use in each nostril as directed 30 mL 12   benzonatate (TESSALON) 200 MG capsule Take 1 capsule (200 mg total) by mouth 3 (three) times daily as needed for cough. 30 capsule 1   Cholecalciferol (VITAMIN D) 50 MCG (2000 UT) tablet Take 2,000 Units by mouth daily.     clopidogrel (PLAVIX) 75 MG tablet Take 0.5 tablets (37.5 mg total) by mouth daily. 46 tablet 3   Coenzyme Q10 300 MG CAPS Take 300 mg by mouth daily.     fexofenadine (ALLEGRA) 180 MG tablet Take 180 mg by mouth daily.     fluticasone (FLONASE) 50 MCG/ACT nasal spray Place 2 sprays into both nostrils daily. 16 g 2   folic acid (FOLVITE) 1 MG tablet Take 1 tablet (1 mg total) by mouth daily. 30 tablet 2   Glycopyrrolate-Formoterol (BEVESPI AEROSPHERE) 9-4.8 MCG/ACT AERO Inhale 2 puffs into the lungs 2 (two) times daily. 10.7 g 5   montelukast (SINGULAIR) 10 MG tablet Take 1 tablet (10 mg total) by mouth at bedtime. 30 tablet 5   Multiple Vitamin (MULTIVITAMIN WITH MINERALS) TABS tablet Take 1 tablet by mouth daily.     pantoprazole (PROTONIX) 40 MG tablet Take 1 tablet (40 mg total) by mouth daily. 30 tablet 5   predniSONE (DELTASONE) 20 MG tablet Take 3 tablets once daily for 3 days followed by 2 tablets once daily for 3 days followed by 1 tablet once daily for 3 days and then stop 18 tablet 0   prochlorperazine (COMPAZINE) 10 MG tablet Take 1 tablet (10 mg total) by mouth every 6 (six) hours as needed. (Patient taking  differently: Take 10 mg by mouth every 6 (six) hours as needed for nausea.) 30 tablet 2   Propylene Glycol (SYSTANE BALANCE OP) Place 1 drop into both eyes daily.     rosuvastatin (CRESTOR) 20 MG tablet Take 1 tablet (20 mg total) by mouth daily. 90 tablet 3   tamoxifen (NOLVADEX) 20 MG tablet Take 1 tablet (20 mg total) by mouth daily. 90 tablet 3   traMADol (ULTRAM) 50 MG tablet TAKE ONE TABLET BY MOUTH EVERY 8 HOURS AS NEEDED FOR PAIN FOR UP TO 5 DAYS (DO NOT DRIVE FOR 8 HOURS AFTER TAKING) 45 tablet 5   traZODone (DESYREL) 50 MG tablet TAKE 1 TABLET BY MOUTH EVERY NIGHT AT BEDTIME AS NEEDED FOR SLEEP (Patient taking differently: Take  50 mg by mouth at bedtime.) 90 tablet 3   vitamin B-12 (CYANOCOBALAMIN) 1000 MCG tablet Take 1,000 mcg by mouth daily.     No current facility-administered medications for this visit.   Facility-Administered Medications Ordered in Other Visits  Medication Dose Route Frequency Provider Last Rate Last Admin   acetaminophen (TYLENOL) tablet 650 mg  650 mg Oral Q6H PRN [name], MD   650 mg at 01/02/22 2111   Or   acetaminophen (TYLENOL) suppository 650 mg  650 mg Rectal Q6H PRN [name], MD       albuterol (PROVENTIL) (2.5 MG/3ML) 0.083% nebulizer solution 2.5 mg  2.5 mg Nebulization Q2H PRN [name], MD       arformoterol (BROVANA) nebulizer solution 15 mcg  15 mcg Nebulization BID [name], MD   15 mcg at 01/03/22 0732   aspirin tablet 325 mg  325 mg Oral Daily [name], MD   325 mg at 01/03/22 0929   budesonide (PULMICORT) nebulizer solution 0.25 mg  0.25 mg Nebulization BID Bonnielee Haff, MD   0.25 mg at 01/03/22 0732   cholecalciferol (VITAMIN D3) tablet 2,000 Units  2,000 Units Oral Daily [name], MD   2,000 Units at 01/03/22 4315   clopidogrel (PLAVIX) tablet 37.5 mg  37.5 mg Oral Daily [name], MD   37.5 mg at 01/03/22 0929   enoxaparin (LOVENOX) injection 40 mg  40 mg Subcutaneous  Q24H [name], MD   40 mg at 01/02/22 0928   fluticasone (FLONASE) 50 MCG/ACT nasal spray 2 spray  2 spray Each Nare Daily [name], MD   2 spray at 40/08/67 6195   folic acid (FOLVITE) tablet 1 mg  1 mg Oral Daily [name], MD   1 mg at 01/03/22 0932   lip balm (CARMEX) ointment 1 Application  1 Application Topical PRN Oswald Hillock, MD       methylPREDNISolone sodium succinate (SOLU-MEDROL) 40 mg/mL injection 40 mg  40 mg Intravenous Q12H Bonnielee Haff, MD   40 mg at 01/02/22 2109   montelukast (SINGULAIR) tablet 10 mg  10 mg Oral QHS [name], MD   10 mg at 01/02/22 2109   multivitamin with minerals tablet 1 tablet  1 tablet Oral Daily [name], MD   1 tablet at 01/03/22 0928   ondansetron (ZOFRAN) tablet 4 mg  4 mg Oral Q6H PRN [name], MD       Or   ondansetron (ZOFRAN) injection 4 mg  4 mg Intravenous Q6H PRN [name], MD       pantoprazole (PROTONIX) EC tablet 40 mg  40 mg Oral Daily [name], MD   40 mg at 01/03/22 6712   rosuvastatin (CRESTOR) tablet 20 mg  20 mg Oral Daily [name], MD   20 mg at 01/03/22 4580   tamoxifen (NOLVADEX) tablet 20 mg  20 mg Oral Daily [name], MD   20 mg at 01/02/22 9983   vitamin B-12 (CYANOCOBALAMIN) tablet 1,000 mcg  1,000 mcg Oral Daily [name], MD   1,000 mcg at 01/03/22 3825    SURGICAL HISTORY:  Past Surgical History:  Procedure Laterality Date   ABDOMINAL HYSTERECTOMY  1978   fibroma   APPENDECTOMY  2002   BREAST EXCISIONAL BIOPSY Right 2003   negative   BREAST LUMPECTOMY WITH RADIOACTIVE SEED LOCALIZATION Left 03/23/2020   Procedure: LEFT BREAST LUMPECTOMY WITH RADIOACTIVE SEED LOCALIZATION;  Surgeon: [name], MD;  Location: [facility];  Service: General;  Laterality: Left;  RNFA   CARPAL TUNNEL RELEASE Left 2010 or 2011   CATARACT EXTRACTION W/ INTRAOCULAR LENS  IMPLANT, BILATERAL Bilateral    COLONOSCOPY W/ POLYPECTOMY   03/27/2007; n7/19/2012   2008: 4 mm adenoma, diverticulosis 2012: ileitis ? NSAID - likely, 2-3 mm cecal polyp LYMPHOID FOLLICLE, diverticulosis   ESOPHAGOGASTRODUODENOSCOPY  01/26/2011   reflux esophagitis, colonscopy done also   HAMMER TOE SURGERY  2008   left foot   HERNIA REPAIR Right 1996?   IR ANGIO INTRA EXTRACRAN SEL COM CAROTID INNOMINATE BILAT MOD SED  10/25/2021   IR ANGIO INTRA EXTRACRAN SEL COM CAROTID INNOMINATE UNI R MOD SED  11/28/2018   IR ANGIO INTRA EXTRACRAN SEL COM CAROTID INNOMINATE UNI R MOD SED  03/17/2021   IR ANGIO INTRA EXTRACRAN SEL INTERNAL CAROTID UNI R MOD SED  12/30/2018   IR ANGIO VERTEBRAL SEL SUBCLAVIAN INNOMINATE UNI R MOD SED  11/28/2018   IR ANGIO VERTEBRAL SEL SUBCLAVIAN INNOMINATE UNI R MOD SED  10/26/2021   IR ANGIO VERTEBRAL SEL VERTEBRAL UNI R MOD SED  03/18/2021   IR ANGIOGRAM FOLLOW UP STUDY  12/30/2018   IR TRANSCATH/EMBOLIZ  12/30/2018   IR US GUIDE VASC ACCESS RIGHT  11/28/2018   IR US GUIDE VASC ACCESS RIGHT  03/17/2021   ORIF ANKLE FRACTURE Right 12/22/2014   Procedure: OPEN REDUCTION INTERNAL FIXATION (ORIF) ANKLE FRACTURE;  Surgeon: Susa Day, MD;  Location: [facility];  Service: Orthopedics;  Laterality: Right;   RADIOLOGY WITH ANESTHESIA N/A 12/30/2018   Procedure: RADIOLOGY WITH ANESTHESIA;  Surgeon: Luanne Bras, MD;  Location: [facility];  Service: Radiology;  Laterality: N/A;   SHOULDER OPEN ROTATOR CUFF REPAIR Left 03/06/2013   Procedure: LEFT ROTATOR CUFF REPAIR, SUBACROMIAL DECOMPRESSION, PATCH GRAFT, MANIPULATION UNDER ANESTHESIA;  Surgeon: Johnn Hai, MD;  Location: [facility];  Service: Orthopedics;  Laterality: Left;   TONSILLECTOMY      REVIEW OF SYSTEMS:   Review of Systems  Constitutional: Positive for fatigue and decreased appetite. Chills and fever have resolved. Negative for chills and unexpected weight change.  HENT: Negative for mouth sores, nosebleeds, sore throat and trouble swallowing.   Eyes: Negative for eye problems and  icterus.  Respiratory: Positive for improving cough.  Positive for dyspnea exertion on supplemental oxygen.  Negative for hemoptysis and wheezing.   Cardiovascular: Negative for chest pain and leg swelling.  Gastrointestinal: Negative for abdominal pain, constipation, diarrhea, nausea and vomiting.  Genitourinary: Negative for bladder incontinence, difficulty urinating, dysuria, frequency and hematuria.   Musculoskeletal: Negative for back pain, gait problem, neck pain and neck stiffness.  Skin: Negative for itching and rash.  Neurological: Negative for dizziness, extremity weakness, gait problem, headaches, light-headedness and seizures.  Hematological: Negative for adenopathy. Does not bruise/bleed easily.  Psychiatric/Behavioral: Negative for confusion, depression and sleep disturbance. The patient is not nervous/anxious.     PHYSICAL EXAMINATION:  There were no vitals taken for this visit.  ECOG PERFORMANCE STATUS: 2  Physical Exam  Constitutional: Oriented to person, place, and time and well-developed, well-nourished, and in no distress.  HENT:  Head: Normocephalic and atraumatic.  Mouth/Throat: Oropharynx is clear and moist. No oropharyngeal exudate.  Eyes: Conjunctivae are normal. Right eye exhibits no discharge. Left eye exhibits no discharge. No scleral icterus.  Neck: Normal range of motion. Neck supple.  Cardiovascular: Normal rate, regular rhythm, normal heart sounds and intact distal pulses.   Pulmonary/Chest: Effort normal and breath sounds normal. No respiratory distress. No wheezes. No rales.  On  supplemental oxygen with 4 L. Abdominal: Soft. Bowel sounds are normal. Exhibits no distension and no mass. There is no tenderness.  Musculoskeletal: Normal range of motion. Exhibits no edema.  Lymphadenopathy:    No cervical adenopathy.  Neurological: Alert and oriented to person, place, and time. Exhibits normal muscle tone.  Examined in the wheelchair.  Skin: Skin is warm  and dry. No rash noted. Not diaphoretic. No erythema. No pallor.  Psychiatric: Mood, memory and judgment normal.  Vitals reviewed.    LABORATORY DATA: Lab Results  Component Value Date   WBC 5.2 01/01/2022   HGB 13.2 01/01/2022   HCT 39.7 01/01/2022   MCV 96.1 01/01/2022   PLT 116 (L) 01/01/2022      Chemistry      Component Value Date/Time   NA 140 01/02/2022 0535   K 3.9 01/02/2022 0535   CL 100 01/02/2022 0535   CO2 33 (H) 01/02/2022 0535   BUN 19 01/02/2022 0535   CREATININE 0.67 01/02/2022 0535   CREATININE 0.74 12/28/2021 1054   CREATININE 0.76 05/24/2020 1414      Component Value Date/Time   CALCIUM 8.9 01/02/2022 0535   ALKPHOS 52 12/31/2021 0536   AST 64 (H) 12/31/2021 0536   AST 16 12/28/2021 1054   ALT 44 12/31/2021 0536   ALT 9 12/28/2021 1054   BILITOT 2.2 (H) 12/31/2021 0536   BILITOT 0.5 12/28/2021 1054       RADIOGRAPHIC STUDIES:  DG Chest Port 1V same Day  Result Date: 01/01/2022 CLINICAL DATA:  Hypoxemia EXAM: PORTABLE CHEST 1 VIEW COMPARISON:  12/29/2021 FINDINGS: Progression of left lower lobe airspace disease. Probable small left effusion. Right lung clear.  Negative for heart failure. Progression of left lower lobe airspace disease. IMPRESSION: No active disease. Electronically Signed   By: Franchot Gallo M.D.   On: 01/01/2022 13:32   DG Chest 2 View  Result Date: 12/29/2021 CLINICAL DATA:  History of lung cancer. Status post chemotherapy. Presents with chills. EXAM: CHEST - 2 VIEW COMPARISON:  10/28/2021. FINDINGS: Heart size and mediastinal contours are unremarkable. Small left pleural effusion. No airspace consolidation. Known left lower lobe lung nodule is again noted measuring 2.5 cm. Asymmetric increase interstitial prominence is noted within the left lung which appears similar to the previous imaging. No signs of atelectasis or pneumothorax. The visualized osseous structures appear intact. IMPRESSION: 1. No acute cardiopulmonary  abnormalities. 2. Left lung nodule compatible with known lung cancer. 3. Unchanged small left pleural effusion with asymmetric interstitial prominence within the left lung. Electronically Signed   By: Kerby Moors M.D.   On: 12/29/2021 17:42   MR BRAIN W WO CONTRAST  Result Date: 12/18/2021 CLINICAL DATA:  Non-small cell lung cancer staging. Prior endovascular treatment of a cerebral aneurysm. EXAM: MRI HEAD WITHOUT AND WITH CONTRAST TECHNIQUE: Multiplanar, multiecho pulse sequences of the brain and surrounding structures were obtained without and with intravenous contrast. CONTRAST:  54m GADAVIST GADOBUTROL 1 MMOL/ML IV SOLN COMPARISON:  Head MRI 07/27/2020 FINDINGS: Brain: There is no evidence of an acute infarct, intracranial hemorrhage, mass, midline shift, or extra-axial fluid collection. There is mild generalized cerebral atrophy. Scattered small T2 hyperintensities in the cerebral white matter bilaterally are unchanged and nonspecific but compatible with mild chronic small vessel ischemic disease. No abnormal enhancement is identified. Vascular: Major intracranial vascular flow voids are preserved. Susceptibility artifact along the right supraclinoid ICA secondary to pipeline flow diverter devices for prior aneurysm treatment. Skull and upper cervical spine: Unremarkable bone marrow  signal. Sinuses/Orbits: Bilateral cataract extraction. Paranasal sinuses and mastoid air cells are clear. Other: None. IMPRESSION: 1. No evidence of intracranial metastases. 2. Mild chronic small vessel ischemic disease. Electronically Signed   By: Logan Bores M.D.   On: 12/18/2021 14:32     ASSESSMENT/PLAN:  This is a very pleasant 87 year old female with: 1) history of stage Ia (T1b, N0, M0) ER positive, PR positive, and HER2 negative left-sided breast cancer.  She was diagnosed in August 2021.  She is status post left breast lumpectomy followed by radioactive seed localization and adjuvant radiotherapy.  She is  currently on tamoxifen. 2) stage IV (T1c, N3, M1C) non-small cell lung cancer, adenocarcinoma.  She presented with a left lower lobe lung nodule in addition to bilateral mediastinal lymphadenopathy.  She also has bilateral pulmonary nodules and multiple metastatic bone lesions.  She was diagnosed in May 2023.  Her PD-L1 expression is 70%.  She is negative for any actionable mutations by Guardant360.  Foundation 1 showed she is positive for K-ras G12 C which can be targeted in the second line setting in the future.  The patient is currently undergoing systemic chemotherapy with carboplatin for an AUC of 5, Alimta 500 mg per metered squared, Keytruda 200 mg IV every 3 weeks.  The patient is status post 1 cycle.  She was hospitalized for fever, chills, nausea, and vomiting.  No source of infection was found.  She is feeling better at this time but not at baseline.  The patient was seen with Dr. Julien Nordmann today.  Labs were reviewed.  The patient's AST and ALT are elevated today.  This could be secondary to immunotherapy, medications administered during her hospitalization, and she also does take 650 of Tylenol twice daily.  Dr. Julien Nordmann recommends steroid for her LFTs.  Patient is already on a steroid taper which she just started.  Dr. Julien Nordmann recommended that she continue this.  We will recheck her LFTs next week.  If her LFTs continue to be persistently elevated, then we will likely arrange for a higher steroid taper.   Regarding her hospitalization, no source of infection was found.  The patient had some current concerns and questions about her hospital work-up.  I reassured the patient and her husband that the appropriate work-up was performed in the hospital and the emergency room.  Discussed that the #1 cause of noncancer related deaths in patients undergoing chemotherapy is infection and we take that seriously.   Dr. Julien Nordmann discussed that discussed that while it is not common to undergo treatment and  present to the emergency room the following day and we do not anticipate/hope that this wont necessarily happen after every infusion.  Dr. Julien Nordmann believes it is possible at that the patient may have had infection causing her fever or pyrexia secondary to her immunotherapy.   We are scheduled to see the patient in 2 weeks for evaluation before she undergoes cycle #2.  We will assess her dose at that time to see if dose adjustment is necessary.  We will see her back for follow-up visit in 2 weeks for evaluation and repeat blood work before undergoing cycle #2.   The patient was advised to avoid Tylenol while her liver enzymes are elevated.   She will continue undergoing palliative radiation to the painful metastatic bone lesions under the care of Dr. Lisbeth Renshaw.  They are working on authorization with her insurance company.   The patient was advised to call immediately if she has any concerning symptoms  in the interval. The patient voices understanding of current disease status and treatment options and is in agreement with the current care plan. All questions were answered. The patient knows to call the clinic with any problems, questions or concerns. We can certainly see the patient much sooner if necessary   No orders of the defined types were placed in this encounter.     Bon Dowis L Lainey Nelson, PA-C 01/03/22  ADDENDUM: Hematology/Oncology Attending: I had a face-to-face encounter with the patient today.  I reviewed her records, lab and recommended her care plan.  This is a very pleasant 87 years old white female diagnosed with a stage IV non-small cell lung cancer, adenocarcinoma in May 2023.  The patient also has a history of stage Ia left breast carcinoma diagnosed in August 2021 status post lumpectomy followed by radioactive seed localization and adjuvant radiotherapy and currently on treatment with tamoxifen.  The patient has positive KRAS G12C mutation as well as PD-L1 expression of  70%. She started last week the first dose of systemic chemotherapy with carboplatin, Alimta and Keytruda.  1 day after the treatment the patient was admitted to the hospital with low-grade fever as well as nausea and vomiting and shortness of breath.  There was no clear etiology for her fever and the patient was treated empirically with vancomycin, cefepime and Flagyl.  Her condition improved and she continues to have the baseline shortness of breath secondary to COPD. Repeat blood work today showed elevated liver enzymes that could be related to her recent hospitalization and antibiotic versus immunotherapy mediated hepatitis. She is currently on a tapered dose of prednisone and will continue with this for now and monitor her blood work closely on a weekly basis.  If she continues to have worsening of her liver enzymes, I may consider her for a higher dose of prednisone until resolution of her liver dysfunction. The patient and her husband are in agreement with the current plan. She will come back for follow-up visit in 2 weeks for evaluation before starting cycle #2. She was advised to call immediately if she has any concerning symptoms in the interval.  The total time spent in the appointment was 30 minutes. Disclaimer: This note was dictated with voice recognition software. Similar sounding words can inadvertently be transcribed and may be missed upon review. Eilleen Kempf, MD

## 2022-01-04 ENCOUNTER — Telehealth: Payer: Self-pay

## 2022-01-04 ENCOUNTER — Inpatient Hospital Stay (HOSPITAL_BASED_OUTPATIENT_CLINIC_OR_DEPARTMENT_OTHER): Payer: Medicare HMO | Admitting: Physician Assistant

## 2022-01-04 ENCOUNTER — Inpatient Hospital Stay: Payer: Medicare HMO

## 2022-01-04 ENCOUNTER — Encounter: Payer: Self-pay | Admitting: Physician Assistant

## 2022-01-04 ENCOUNTER — Ambulatory Visit: Payer: Medicare HMO

## 2022-01-04 ENCOUNTER — Other Ambulatory Visit: Payer: Self-pay

## 2022-01-04 VITALS — BP 135/65 | HR 79 | Temp 97.6°F | Resp 16 | Ht 64.0 in | Wt 167.9 lb

## 2022-01-04 DIAGNOSIS — R7401 Elevation of levels of liver transaminase levels: Secondary | ICD-10-CM | POA: Diagnosis not present

## 2022-01-04 DIAGNOSIS — Z90710 Acquired absence of both cervix and uterus: Secondary | ICD-10-CM | POA: Diagnosis not present

## 2022-01-04 DIAGNOSIS — J449 Chronic obstructive pulmonary disease, unspecified: Secondary | ICD-10-CM | POA: Diagnosis not present

## 2022-01-04 DIAGNOSIS — C3491 Malignant neoplasm of unspecified part of right bronchus or lung: Secondary | ICD-10-CM

## 2022-01-04 DIAGNOSIS — R918 Other nonspecific abnormal finding of lung field: Secondary | ICD-10-CM

## 2022-01-04 DIAGNOSIS — I10 Essential (primary) hypertension: Secondary | ICD-10-CM | POA: Diagnosis not present

## 2022-01-04 DIAGNOSIS — E538 Deficiency of other specified B group vitamins: Secondary | ICD-10-CM | POA: Diagnosis not present

## 2022-01-04 DIAGNOSIS — C50912 Malignant neoplasm of unspecified site of left female breast: Secondary | ICD-10-CM | POA: Diagnosis not present

## 2022-01-04 DIAGNOSIS — R7989 Other specified abnormal findings of blood chemistry: Secondary | ICD-10-CM | POA: Diagnosis not present

## 2022-01-04 DIAGNOSIS — Z79810 Long term (current) use of selective estrogen receptor modulators (SERMs): Secondary | ICD-10-CM | POA: Diagnosis not present

## 2022-01-04 DIAGNOSIS — Z7952 Long term (current) use of systemic steroids: Secondary | ICD-10-CM | POA: Diagnosis not present

## 2022-01-04 DIAGNOSIS — Z170 Estrogen receptor positive status [ER+]: Secondary | ICD-10-CM | POA: Diagnosis not present

## 2022-01-04 DIAGNOSIS — C3432 Malignant neoplasm of lower lobe, left bronchus or lung: Secondary | ICD-10-CM | POA: Diagnosis not present

## 2022-01-04 DIAGNOSIS — C7951 Secondary malignant neoplasm of bone: Secondary | ICD-10-CM | POA: Diagnosis not present

## 2022-01-04 LAB — CMP (CANCER CENTER ONLY)
ALT: 153 U/L — ABNORMAL HIGH (ref 0–44)
AST: 178 U/L (ref 15–41)
Albumin: 2.9 g/dL — ABNORMAL LOW (ref 3.5–5.0)
Alkaline Phosphatase: 174 U/L — ABNORMAL HIGH (ref 38–126)
Anion gap: 10 (ref 5–15)
BUN: 29 mg/dL — ABNORMAL HIGH (ref 8–23)
CO2: 29 mmol/L (ref 22–32)
Calcium: 9.5 mg/dL (ref 8.9–10.3)
Chloride: 101 mmol/L (ref 98–111)
Creatinine: 0.69 mg/dL (ref 0.44–1.00)
GFR, Estimated: >60 mL/min (ref >60)
Glucose, Bld: 158 mg/dL — ABNORMAL HIGH (ref 70–99)
Potassium: 4.1 mmol/L (ref 3.5–5.1)
Sodium: 140 mmol/L (ref 135–145)
Total Bilirubin: 1.2 mg/dL (ref 0.3–1.2)
Total Protein: 6 g/dL — ABNORMAL LOW (ref 6.5–8.1)

## 2022-01-04 LAB — CBC WITH DIFFERENTIAL (CANCER CENTER ONLY)
Abs Immature Granulocytes: 0.02 10*3/uL (ref 0.00–0.07)
Basophils Absolute: 0 10*3/uL (ref 0.0–0.1)
Basophils Relative: 0 %
Eosinophils Absolute: 0 10*3/uL (ref 0.0–0.5)
Eosinophils Relative: 1 %
HCT: 37.1 % (ref 36.0–46.0)
Hemoglobin: 12.8 g/dL (ref 12.0–15.0)
Immature Granulocytes: 1 %
Lymphocytes Relative: 6 %
Lymphs Abs: 0.2 10*3/uL — ABNORMAL LOW (ref 0.7–4.0)
MCH: 31.8 pg (ref 26.0–34.0)
MCHC: 34.5 g/dL (ref 30.0–36.0)
MCV: 92.3 fL (ref 80.0–100.0)
Monocytes Absolute: 0.3 10*3/uL (ref 0.1–1.0)
Monocytes Relative: 12 %
Neutro Abs: 2.4 10*3/uL (ref 1.7–7.7)
Neutrophils Relative %: 80 %
Platelet Count: 94 10*3/uL — ABNORMAL LOW (ref 150–400)
RBC: 4.02 MIL/uL (ref 3.87–5.11)
RDW: 13.3 % (ref 11.5–15.5)
WBC Count: 2.9 10*3/uL — ABNORMAL LOW (ref 4.0–10.5)
nRBC: 0 % (ref 0.0–0.2)

## 2022-01-04 NOTE — Telephone Encounter (Signed)
Critical lab value report of AST 178 by [facility].  Notified Dr. Burr Medico via Pt Call message.

## 2022-01-04 NOTE — Progress Notes (Unsigned)
CRIT AST(178) CALLED TO DAWN PLECKE AT [phone].[id number]

## 2022-01-05 ENCOUNTER — Telehealth: Payer: Self-pay

## 2022-01-05 ENCOUNTER — Ambulatory Visit: Payer: Medicare HMO

## 2022-01-05 NOTE — Telephone Encounter (Signed)
Transition Care Management Follow-up Telephone Call Date of discharge and from where: [facility] 01/03/22 How have you been since you were released from the hospital? Better today  Any questions or concerns? No  Items Reviewed: Did the pt receive and understand the discharge instructions provided? Yes  Medications obtained and verified? Yes  Other? No  Any new allergies since your discharge? No  Dietary orders reviewed? Yes Do you have support at home? Yes   Home Care and Equipment/Supplies: Were home health services ordered? not applicable If so, what is the name of the agency?   Has the agency set up a time to come to the patient's home? not applicable Were any new equipment or medical supplies ordered?  No What is the name of the medical supply agency?  Were you able to get the supplies/equipment? not applicable Do you have any questions related to the use of the equipment or supplies? No  Functional Questionnaire: (I = Independent and D = Dependent) ADLs: I  Bathing/Dressing- I  Meal Prep- I  Eating- I  Maintaining continence- I  Transferring/Ambulation- I  Managing Meds- I  Follow up appointments reviewed:  PCP Hospital f/u appt confirmed? No   Specialist Hospital f/u appt confirmed? Yes  Scheduled to see Oncology for follow ups  Are transportation arrangements needed? No  If their condition worsens, is the pt aware to call PCP or go to the Emergency Dept.? Yes Was the patient provided with contact information for the PCP's office or ED? Yes Was to pt encouraged to call back with questions or concerns? Yes

## 2022-01-05 NOTE — Telephone Encounter (Signed)
Glad she has close oncology follow up- we are available if she needs to see [place]

## 2022-01-06 ENCOUNTER — Ambulatory Visit: Payer: Medicare HMO

## 2022-01-06 ENCOUNTER — Telehealth: Payer: Self-pay | Admitting: Family Medicine

## 2022-01-06 DIAGNOSIS — C3491 Malignant neoplasm of unspecified part of right bronchus or lung: Secondary | ICD-10-CM

## 2022-01-06 DIAGNOSIS — R058 Other specified cough: Secondary | ICD-10-CM

## 2022-01-06 MED ORDER — BENZONATATE 200 MG PO CAPS: 200.0000 mg | ORAL_CAPSULE | Freq: Three times a day (TID) | ORAL | 1 refills | Status: DC | PRN

## 2022-01-06 NOTE — Telephone Encounter (Signed)
Received message through secure messaging requesting for home health orders to be placed-apparently was not done while patient was in the hospital and hospitalist is currently not available-they will come down to schedule follow-up visit with me so I will be able to sign for face-to-face and orders were placed

## 2022-01-09 ENCOUNTER — Other Ambulatory Visit: Payer: Self-pay

## 2022-01-09 ENCOUNTER — Ambulatory Visit
Admission: RE | Admit: 2022-01-09 | Discharge: 2022-01-09 | Disposition: A | Discharge status: Home / Self Care | Payer: Medicare HMO | Source: Ambulatory Visit | Attending: Radiation Oncology | Admitting: Radiation Oncology

## 2022-01-09 DIAGNOSIS — Z87891 Personal history of nicotine dependence: Secondary | ICD-10-CM | POA: Diagnosis not present

## 2022-01-09 DIAGNOSIS — C3432 Malignant neoplasm of lower lobe, left bronchus or lung: Secondary | ICD-10-CM | POA: Diagnosis not present

## 2022-01-09 DIAGNOSIS — Z510 Encounter for antineoplastic radiation therapy: Secondary | ICD-10-CM | POA: Diagnosis not present

## 2022-01-09 LAB — RAD ONC ARIA SESSION SUMMARY
Course Elapsed Days: 0
Plan Fractions Treated to Date: 1
Plan Fractions Treated to Date: 1
Plan Prescribed Dose Per Fraction: 2.5 Gy
Plan Prescribed Dose Per Fraction: 2.5 Gy
Plan Total Fractions Prescribed: 15
Plan Total Fractions Prescribed: 15
Plan Total Prescribed Dose: 37.5 Gy
Plan Total Prescribed Dose: 37.5 Gy
Reference Point Dosage Given to Date: 2.5 Gy
Reference Point Dosage Given to Date: 2.5 Gy
Reference Point Session Dosage Given: 2.5 Gy
Reference Point Session Dosage Given: 2.5 Gy
Session Number: 1

## 2022-01-11 ENCOUNTER — Telehealth: Payer: Self-pay | Admitting: Family Medicine

## 2022-01-11 ENCOUNTER — Other Ambulatory Visit: Payer: Self-pay

## 2022-01-11 ENCOUNTER — Ambulatory Visit
Admission: RE | Admit: 2022-01-11 | Discharge: 2022-01-11 | Disposition: A | Discharge status: Home / Self Care | Payer: Medicare HMO | Source: Ambulatory Visit | Attending: Radiation Oncology | Admitting: Radiation Oncology

## 2022-01-11 ENCOUNTER — Inpatient Hospital Stay: Payer: Medicare HMO | Attending: Nurse Practitioner

## 2022-01-11 DIAGNOSIS — C7951 Secondary malignant neoplasm of bone: Secondary | ICD-10-CM | POA: Insufficient documentation

## 2022-01-11 DIAGNOSIS — K219 Gastro-esophageal reflux disease without esophagitis: Secondary | ICD-10-CM | POA: Insufficient documentation

## 2022-01-11 DIAGNOSIS — Z5111 Encounter for antineoplastic chemotherapy: Secondary | ICD-10-CM | POA: Insufficient documentation

## 2022-01-11 DIAGNOSIS — Z7902 Long term (current) use of antithrombotics/antiplatelets: Secondary | ICD-10-CM | POA: Diagnosis not present

## 2022-01-11 DIAGNOSIS — F1011 Alcohol abuse, in remission: Secondary | ICD-10-CM | POA: Diagnosis not present

## 2022-01-11 DIAGNOSIS — J449 Chronic obstructive pulmonary disease, unspecified: Secondary | ICD-10-CM | POA: Diagnosis not present

## 2022-01-11 DIAGNOSIS — E785 Hyperlipidemia, unspecified: Secondary | ICD-10-CM | POA: Insufficient documentation

## 2022-01-11 DIAGNOSIS — Z79810 Long term (current) use of selective estrogen receptor modulators (SERMs): Secondary | ICD-10-CM | POA: Diagnosis not present

## 2022-01-11 DIAGNOSIS — R590 Localized enlarged lymph nodes: Secondary | ICD-10-CM | POA: Insufficient documentation

## 2022-01-11 DIAGNOSIS — Z8673 Personal history of transient ischemic attack (TIA), and cerebral infarction without residual deficits: Secondary | ICD-10-CM | POA: Insufficient documentation

## 2022-01-11 DIAGNOSIS — I10 Essential (primary) hypertension: Secondary | ICD-10-CM | POA: Insufficient documentation

## 2022-01-11 DIAGNOSIS — C50912 Malignant neoplasm of unspecified site of left female breast: Secondary | ICD-10-CM | POA: Diagnosis not present

## 2022-01-11 DIAGNOSIS — Z510 Encounter for antineoplastic radiation therapy: Secondary | ICD-10-CM | POA: Diagnosis not present

## 2022-01-11 DIAGNOSIS — M79662 Pain in left lower leg: Secondary | ICD-10-CM

## 2022-01-11 DIAGNOSIS — Z7982 Long term (current) use of aspirin: Secondary | ICD-10-CM | POA: Insufficient documentation

## 2022-01-11 DIAGNOSIS — M1990 Unspecified osteoarthritis, unspecified site: Secondary | ICD-10-CM | POA: Insufficient documentation

## 2022-01-11 DIAGNOSIS — Z170 Estrogen receptor positive status [ER+]: Secondary | ICD-10-CM | POA: Diagnosis not present

## 2022-01-11 DIAGNOSIS — C3432 Malignant neoplasm of lower lobe, left bronchus or lung: Secondary | ICD-10-CM | POA: Diagnosis not present

## 2022-01-11 DIAGNOSIS — R7989 Other specified abnormal findings of blood chemistry: Secondary | ICD-10-CM | POA: Insufficient documentation

## 2022-01-11 DIAGNOSIS — Z79899 Other long term (current) drug therapy: Secondary | ICD-10-CM | POA: Insufficient documentation

## 2022-01-11 DIAGNOSIS — Z87891 Personal history of nicotine dependence: Secondary | ICD-10-CM | POA: Diagnosis not present

## 2022-01-11 DIAGNOSIS — R918 Other nonspecific abnormal finding of lung field: Secondary | ICD-10-CM

## 2022-01-11 LAB — CBC WITH DIFFERENTIAL (CANCER CENTER ONLY)
Abs Immature Granulocytes: 0.04 10*3/uL (ref 0.00–0.07)
Basophils Absolute: 0 10*3/uL (ref 0.0–0.1)
Basophils Relative: 0 %
Eosinophils Absolute: 0 10*3/uL (ref 0.0–0.5)
Eosinophils Relative: 0 %
HCT: 36.6 % (ref 36.0–46.0)
Hemoglobin: 12.4 g/dL (ref 12.0–15.0)
Immature Granulocytes: 1 %
Lymphocytes Relative: 3 %
Lymphs Abs: 0.2 10*3/uL — ABNORMAL LOW (ref 0.7–4.0)
MCH: 32 pg (ref 26.0–34.0)
MCHC: 33.9 g/dL (ref 30.0–36.0)
MCV: 94.6 fL (ref 80.0–100.0)
Monocytes Absolute: 0.4 10*3/uL (ref 0.1–1.0)
Monocytes Relative: 5 %
Neutro Abs: 6.6 10*3/uL (ref 1.7–7.7)
Neutrophils Relative %: 91 %
Platelet Count: 240 10*3/uL (ref 150–400)
RBC: 3.87 MIL/uL (ref 3.87–5.11)
RDW: 13.7 % (ref 11.5–15.5)
WBC Count: 7.2 10*3/uL (ref 4.0–10.5)
nRBC: 0 % (ref 0.0–0.2)

## 2022-01-11 LAB — RAD ONC ARIA SESSION SUMMARY
Course Elapsed Days: 2
Plan Fractions Treated to Date: 2
Plan Fractions Treated to Date: 2
Plan Prescribed Dose Per Fraction: 2.5 Gy
Plan Prescribed Dose Per Fraction: 2.5 Gy
Plan Total Fractions Prescribed: 15
Plan Total Fractions Prescribed: 15
Plan Total Prescribed Dose: 37.5 Gy
Plan Total Prescribed Dose: 37.5 Gy
Reference Point Dosage Given to Date: 5 Gy
Reference Point Dosage Given to Date: 5 Gy
Reference Point Session Dosage Given: 2.5 Gy
Reference Point Session Dosage Given: 2.5 Gy
Session Number: 2

## 2022-01-11 LAB — CMP (CANCER CENTER ONLY)
ALT: 75 U/L — ABNORMAL HIGH (ref 0–44)
AST: 47 U/L — ABNORMAL HIGH (ref 15–41)
Albumin: 3.3 g/dL — ABNORMAL LOW (ref 3.5–5.0)
Alkaline Phosphatase: 109 U/L (ref 38–126)
Anion gap: 8 (ref 5–15)
BUN: 12 mg/dL (ref 8–23)
CO2: 30 mmol/L (ref 22–32)
Calcium: 9.1 mg/dL (ref 8.9–10.3)
Chloride: 103 mmol/L (ref 98–111)
Creatinine: 0.67 mg/dL (ref 0.44–1.00)
GFR, Estimated: >60 mL/min (ref >60)
Glucose, Bld: 150 mg/dL — ABNORMAL HIGH (ref 70–99)
Potassium: 4.1 mmol/L (ref 3.5–5.1)
Sodium: 141 mmol/L (ref 135–145)
Total Bilirubin: 0.6 mg/dL (ref 0.3–1.2)
Total Protein: 6.3 g/dL — ABNORMAL LOW (ref 6.5–8.1)

## 2022-01-11 NOTE — Telephone Encounter (Signed)
Sharyn Lull, an RN with [facility] called on behalf of patient. States that patient refused home health services due to feeling as though she does not need to be nursed. Instead she would like to complete PT at [facility] where she resides. In order for this to be done she will need a new order for outpatient evaluation for PT. This new order can be faxed to [phone].

## 2022-01-11 NOTE — Telephone Encounter (Signed)
Caller states she attempted to set up care as referred: [place] states patient declined home health services.   Caller states patient will reach out to on-site PT services in the senior living facility where she resides.   Caller states patient is currently being seen daily at the cancer center.

## 2022-01-11 NOTE — Telephone Encounter (Signed)
Noted  

## 2022-01-11 NOTE — Telephone Encounter (Signed)
Sheila Shields with [facility] requests to be called at ph# [phone] with a Verbal Order for what is needed for Patient from [facility] (they have received the Referral without Orders).

## 2022-01-11 NOTE — Telephone Encounter (Signed)
Sharyn Lull with [facility] called to get orders for [place] for pt. Relayed the first msg to her about pt refusal. She will call back after she clarifies if orders are still needed.

## 2022-01-12 ENCOUNTER — Ambulatory Visit
Admission: RE | Admit: 2022-01-12 | Discharge: 2022-01-12 | Disposition: A | Discharge status: Home / Self Care | Payer: Medicare HMO | Source: Ambulatory Visit | Attending: Radiation Oncology | Admitting: Radiation Oncology

## 2022-01-12 ENCOUNTER — Other Ambulatory Visit: Payer: Self-pay

## 2022-01-12 DIAGNOSIS — Z510 Encounter for antineoplastic radiation therapy: Secondary | ICD-10-CM | POA: Diagnosis not present

## 2022-01-12 DIAGNOSIS — Z87891 Personal history of nicotine dependence: Secondary | ICD-10-CM | POA: Diagnosis not present

## 2022-01-12 DIAGNOSIS — C3432 Malignant neoplasm of lower lobe, left bronchus or lung: Secondary | ICD-10-CM | POA: Diagnosis not present

## 2022-01-12 LAB — RAD ONC ARIA SESSION SUMMARY
Course Elapsed Days: 3
Plan Fractions Treated to Date: 3
Plan Fractions Treated to Date: 3
Plan Prescribed Dose Per Fraction: 2.5 Gy
Plan Prescribed Dose Per Fraction: 2.5 Gy
Plan Total Fractions Prescribed: 15
Plan Total Fractions Prescribed: 15
Plan Total Prescribed Dose: 37.5 Gy
Plan Total Prescribed Dose: 37.5 Gy
Reference Point Dosage Given to Date: 7.5 Gy
Reference Point Dosage Given to Date: 7.5 Gy
Reference Point Session Dosage Given: 2.5 Gy
Reference Point Session Dosage Given: 2.5 Gy
Session Number: 3

## 2022-01-12 NOTE — Telephone Encounter (Signed)
Sharyn Lull, an RN with [facility] called on behalf of patient. States that patient refused home health services due to feeling as though she does not need to be nursed. Instead she would like to complete PT at [facility] where she resides. In order for this to be done she will need a new order for outpatient evaluation for PT. This new order can be faxed to [phone].

## 2022-01-12 NOTE — Telephone Encounter (Signed)
Referral has been placed for [facility].

## 2022-01-13 ENCOUNTER — Encounter: Payer: Self-pay | Admitting: Physician Assistant

## 2022-01-13 ENCOUNTER — Other Ambulatory Visit: Payer: Self-pay

## 2022-01-13 ENCOUNTER — Ambulatory Visit
Admission: RE | Admit: 2022-01-13 | Discharge: 2022-01-13 | Disposition: A | Discharge status: Home / Self Care | Payer: Medicare HMO | Source: Ambulatory Visit | Attending: Radiation Oncology | Admitting: Radiation Oncology

## 2022-01-13 DIAGNOSIS — C3432 Malignant neoplasm of lower lobe, left bronchus or lung: Secondary | ICD-10-CM | POA: Diagnosis not present

## 2022-01-13 DIAGNOSIS — Z87891 Personal history of nicotine dependence: Secondary | ICD-10-CM | POA: Diagnosis not present

## 2022-01-13 DIAGNOSIS — Z510 Encounter for antineoplastic radiation therapy: Secondary | ICD-10-CM | POA: Diagnosis not present

## 2022-01-13 LAB — RAD ONC ARIA SESSION SUMMARY
Course Elapsed Days: 4
Plan Fractions Treated to Date: 4
Plan Fractions Treated to Date: 4
Plan Prescribed Dose Per Fraction: 2.5 Gy
Plan Prescribed Dose Per Fraction: 2.5 Gy
Plan Total Fractions Prescribed: 15
Plan Total Fractions Prescribed: 15
Plan Total Prescribed Dose: 37.5 Gy
Plan Total Prescribed Dose: 37.5 Gy
Reference Point Dosage Given to Date: 10 Gy
Reference Point Dosage Given to Date: 10 Gy
Reference Point Session Dosage Given: 2.5 Gy
Reference Point Session Dosage Given: 2.5 Gy
Session Number: 4

## 2022-01-13 NOTE — Progress Notes (Signed)
[place] OFFICE PROGRESS NOTE  Marin Olp, MD [address] [place] [address]  DIAGNOSIS:  1) Stage IV (T1c, N3, M1C) non-small cell lung cancer, adenocarcinoma presented with left lower lobe lung nodule in addition to bilateral mediastinal lymphadenopathy as well as bilateral pulmonary nodules and multiple metastatic bone lesions diagnosed in May 2023. 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   Guardant 360: No actionable mutations   Foundation one: Positive for KRASG12C   PDL1: 70%  PRIOR THERAPY: 1) status post left breast lumpectomy followed by radioactive seed localization and adjuvant radiotherapy and currently on tamoxifen.  CURRENT THERAPY: 1) palliative systemic chemotherapy with carboplatin for an AUC of 5, Alimta 500 mg per metered square, Keytruda 200 mg IV every 3 weeks.  First dose expected 12/28/2021 2) palliative radiation to the painful metastatic bone lesions (hip and below shoulder blade) under the care of Dr. Lisbeth Renshaw.  Her last day of radiation is tentatively scheduled for 01/26/2022  INTERVAL HISTORY: Sheila Shields 87 y.o. female returns to the clinic today for a follow-up visit accompanied by her husband.  The patient was recently diagnosed with lung cancer.  She underwent her first cycle of treatment on 12/28/2021.  The day following her infusion she had some fever and chills.  She presented to the emergency room.  She was admitted to the hospital from 12/29/2021-01/03/2022.  She had a UA, blood culture, and a chest x-ray, none of which showed a clear etiology of her fever.  She initially was started on broad-spectrum antibiotics with Vanco, cefepime, and Flagyl but this was discontinued.  Her fever had resolved. Her Tmax was 100.8. She was  also requiring more oxygen in the hospital.  She also had some acute on chronic respiratory failure with hypoxia and COPD and possible acute exacerbation.  She was given nebulizer treatments and discharged home on a steroid taper.  We saw her back a few days after discharge, she was feeling better but not her baseline.  She was also found to have elevated LFTs that day.  Recommended that she continue on her steroid taper which she has since completed.  Her LFTs were downtrending on her 1 week follow-up lab appointment.  She had been taking Tylenol 650 mg twice daily for arthritis.  She is now taking tramadol for breakthrough pain.  Overall, the patient reports fatigue.  When her, she does feel well enough to proceed with treatment today.  She denies any fever or chills.  She reports her appetite is improving.  She is drinking supplemental drinks if needed on days that she has less of an appetite.  She reports a similar cough with pale green phlegm.  She thinks her dyspnea on exertion is about the same.  She is using a walker for ambulation.  Denies any chest pain or hemoptysis.  She was previously seen by Dr. Lamonte Sakai for her COPD.  She also scheduled to see her PCP next week.  She sleeps with supplemental oxygen and uses it throughout the day as needed.  She reports some postnasal drainage and crusty eyes in the morning.  She has azelastine nasal spray and Flonase and takes Allegra every day. Denies any nausea, vomiting, diarrhea, or constipation.  She is here today for evaluation and repeat blood work before undergoing cycle #  2.   MEDICAL HISTORY: Past Medical History:  Diagnosis Date   Alcoholism in remission ([place]) 08/27/2007   Anemia    Arthritis    OA   Breast cancer ([place])    left breast   COPD 12/17/2006   Diverticulosis 03/27/2007   GERD 05/01/2007   HYPERGLYCEMIA 08/27/2007   HYPERLIPIDEMIA 12/17/2006   HYPERTENSION 12/17/2006   Mood disorder ([place])    hx anxiety and depression. meds short term  during life transition   Pneumonia    AS A CHILD   Rectal polyp 03/27/2007   adenoma   SOB (shortness of breath) on exertion    per patient due to having COPD   TIA (transient ischemic attack)     ALLERGIES:  is allergic to alcohol, cheese, and dilaudid [hydromorphone hcl].  MEDICATIONS:  Current Outpatient Medications  Medication Sig Dispense Refill   acetaminophen (TYLENOL) 650 MG CR tablet Take 1,300 mg by mouth 2 (two) times daily.     albuterol (PROAIR HFA) 108 (90 Base) MCG/ACT inhaler INHALE 2 PUFFS EVERY 4 HOURS AS NEEDED FOR COUGHING SPELLS 18 g 5   aspirin 325 MG tablet Take 1 tablet (325 mg total) by mouth daily. 30 tablet 3   azelastine (ASTELIN) 0.1 % nasal spray Place 2 sprays into both nostrils 2 (two) times daily. Use in each nostril as directed 30 mL 12   benzonatate (TESSALON) 200 MG capsule Take 1 capsule (200 mg total) by mouth 3 (three) times daily as needed for cough. 30 capsule 1   Cholecalciferol (VITAMIN D) 50 MCG (2000 UT) tablet Take 2,000 Units by mouth daily.     clopidogrel (PLAVIX) 75 MG tablet Take 0.5 tablets (37.5 mg total) by mouth daily. 46 tablet 3   Coenzyme Q10 300 MG CAPS Take 300 mg by mouth daily.     fexofenadine (ALLEGRA) 180 MG tablet Take 180 mg by mouth daily.     fluticasone (FLONASE) 50 MCG/ACT nasal spray Place 2 sprays into both nostrils daily. 16 g 2   folic acid (FOLVITE) 1 MG tablet Take 1 tablet (1 mg total) by mouth daily. 30 tablet 2   Glycopyrrolate-Formoterol (BEVESPI AEROSPHERE) 9-4.8 MCG/ACT AERO Inhale 2 puffs into the lungs 2 (two) times daily. 10.7 g 5   montelukast (SINGULAIR) 10 MG tablet Take 1 tablet (10 mg total) by mouth at bedtime. 30 tablet 5   Multiple Vitamin (MULTIVITAMIN WITH MINERALS) TABS tablet Take 1 tablet by mouth daily.     pantoprazole (PROTONIX) 40 MG tablet Take 1 tablet (40 mg total) by mouth daily. 30 tablet 5   predniSONE (DELTASONE) 20 MG tablet Take 3 tablets once daily for 3 days followed by 2  tablets once daily for 3 days followed by 1 tablet once daily for 3 days and then stop 18 tablet 0   prochlorperazine (COMPAZINE) 10 MG tablet Take 1 tablet (10 mg total) by mouth every 6 (six) hours as needed. (Patient taking differently: Take 10 mg by mouth every 6 (six) hours as needed for nausea.) 30 tablet 2   Propylene Glycol (SYSTANE BALANCE OP) Place 1 drop into both eyes daily.     rosuvastatin (CRESTOR) 20 MG tablet Take 1 tablet (20 mg total) by mouth daily. 90 tablet 3   tamoxifen (NOLVADEX) 20 MG tablet Take 1 tablet (20 mg total) by mouth daily. 90 tablet 3   traMADol (ULTRAM) 50 MG tablet TAKE ONE TABLET BY MOUTH EVERY 8 HOURS AS NEEDED FOR PAIN FOR UP TO  5 DAYS (DO NOT DRIVE FOR 8 HOURS AFTER TAKING) 45 tablet 5   traZODone (DESYREL) 50 MG tablet TAKE 1/2- 1 TABLET BY MOUTH EVERY NIGHT AT BEDTIME AS NEEDED SLEEP 30 tablet 3   vitamin B-12 (CYANOCOBALAMIN) 1000 MCG tablet Take 1,000 mcg by mouth daily.     No current facility-administered medications for this visit.    SURGICAL HISTORY:  Past Surgical History:  Procedure Laterality Date   ABDOMINAL HYSTERECTOMY  1978   fibroma   APPENDECTOMY  2002   BREAST EXCISIONAL BIOPSY Right 2003   negative   BREAST LUMPECTOMY WITH RADIOACTIVE SEED LOCALIZATION Left 03/23/2020   Procedure: LEFT BREAST LUMPECTOMY WITH RADIOACTIVE SEED LOCALIZATION;  Surgeon: [name], MD;  Location: [facility];  Service: General;  Laterality: Left;  RNFA   CARPAL TUNNEL RELEASE Left 2010 or 2011   CATARACT EXTRACTION W/ INTRAOCULAR LENS  IMPLANT, BILATERAL Bilateral    COLONOSCOPY W/ POLYPECTOMY  03/27/2007; n7/19/2012   2008: 4 mm adenoma, diverticulosis 2012: ileitis ? NSAID - likely, 2-3 mm cecal polyp LYMPHOID FOLLICLE, diverticulosis   ESOPHAGOGASTRODUODENOSCOPY  01/26/2011   reflux esophagitis, colonscopy done also   HAMMER TOE SURGERY  2008   left foot   HERNIA REPAIR Right 1996?   IR ANGIO INTRA EXTRACRAN SEL COM CAROTID INNOMINATE BILAT MOD SED   10/25/2021   IR ANGIO INTRA EXTRACRAN SEL COM CAROTID INNOMINATE UNI R MOD SED  11/28/2018   IR ANGIO INTRA EXTRACRAN SEL COM CAROTID INNOMINATE UNI R MOD SED  03/17/2021   IR ANGIO INTRA EXTRACRAN SEL INTERNAL CAROTID UNI R MOD SED  12/30/2018   IR ANGIO VERTEBRAL SEL SUBCLAVIAN INNOMINATE UNI R MOD SED  11/28/2018   IR ANGIO VERTEBRAL SEL SUBCLAVIAN INNOMINATE UNI R MOD SED  10/26/2021   IR ANGIO VERTEBRAL SEL VERTEBRAL UNI R MOD SED  03/18/2021   IR ANGIOGRAM FOLLOW UP STUDY  12/30/2018   IR TRANSCATH/EMBOLIZ  12/30/2018   IR US GUIDE VASC ACCESS RIGHT  11/28/2018   IR US GUIDE VASC ACCESS RIGHT  03/17/2021   ORIF ANKLE FRACTURE Right 12/22/2014   Procedure: OPEN REDUCTION INTERNAL FIXATION (ORIF) ANKLE FRACTURE;  Surgeon: Susa Day, MD;  Location: [facility];  Service: Orthopedics;  Laterality: Right;   RADIOLOGY WITH ANESTHESIA N/A 12/30/2018   Procedure: RADIOLOGY WITH ANESTHESIA;  Surgeon: Luanne Bras, MD;  Location: [facility];  Service: Radiology;  Laterality: N/A;   SHOULDER OPEN ROTATOR CUFF REPAIR Left 03/06/2013   Procedure: LEFT ROTATOR CUFF REPAIR, SUBACROMIAL DECOMPRESSION, PATCH GRAFT, MANIPULATION UNDER ANESTHESIA;  Surgeon: Johnn Hai, MD;  Location: [facility];  Service: Orthopedics;  Laterality: Left;   TONSILLECTOMY      REVIEW OF SYSTEMS:   Review of Systems  Constitutional: No for fatigue.  Negative for appetite change, chills, fever and unexpected weight change.  HENT: Negative for mouth sores, nosebleeds, sore throat and trouble swallowing.   Eyes: Negative for eye problems and icterus.  Respiratory: Positive for cough and dyspnea on exertion.  Negative for hemoptysis, and wheezing.   Cardiovascular: Negative for chest pain and leg swelling.  Gastrointestinal: Negative for abdominal pain, constipation, diarrhea, nausea and vomiting.  Genitourinary: Negative for bladder incontinence, difficulty urinating, dysuria, frequency and hematuria.   Musculoskeletal: Negative for back  pain, gait problem, neck pain and neck stiffness.  Skin: Negative for itching and rash.  Neurological: Negative for dizziness, extremity weakness, gait problem, headaches, light-headedness and seizures.  Hematological: Negative for adenopathy. Does not bruise/bleed easily.  Psychiatric/Behavioral: Negative for  confusion, depression and sleep disturbance. The patient is not nervous/anxious.     PHYSICAL EXAMINATION:  Blood pressure 112/61, pulse 91, temperature 97.7 F (36.5 C), temperature source Oral, resp. rate 18, height '5\' 4"'  (1.626 m), weight 165 lb 11.2 oz (75.2 kg), SpO2 92 %.  ECOG PERFORMANCE STATUS: 1  Physical Exam  Constitutional: Oriented to person, place, and time and well-developed, well-nourished, and in no distress.  HENT:  Head: Normocephalic and atraumatic.  Mouth/Throat: Oropharynx is clear and moist. No oropharyngeal exudate.  Eyes: Conjunctivae are normal. Right eye exhibits no discharge. Left eye exhibits no discharge. No scleral icterus.  Neck: Normal range of motion. Neck supple.  Cardiovascular: Normal rate, regular rhythm, normal heart sounds and intact distal pulses.   Pulmonary/Chest: Effort normal and breath sounds normal. No respiratory distress. No wheezes. No rales.  Abdominal: Soft. Bowel sounds are normal. Exhibits no distension and no mass. There is no tenderness.  Musculoskeletal: Normal range of motion.  Positive for baseline bilateral lower extremity edema. Lymphadenopathy:    No cervical adenopathy.  Neurological: Alert and oriented to person, place, and time. Exhibits normal muscle tone.  Ambulates with a walker. Skin: Skin is warm and dry. No rash noted. Not diaphoretic. No erythema. No pallor.  Psychiatric: Mood, memory and judgment normal.  Vitals reviewed.  LABORATORY DATA: Lab Results  Component Value Date   WBC 4.3 01/18/2022   HGB 11.8 (L) 01/18/2022   HCT 35.1 (L) 01/18/2022   MCV 95.9 01/18/2022   PLT 319 01/18/2022       Chemistry      Component Value Date/Time   NA 142 01/18/2022 1002   K 4.2 01/18/2022 1002   CL 107 01/18/2022 1002   CO2 30 01/18/2022 1002   BUN 10 01/18/2022 1002   CREATININE 0.69 01/18/2022 1002   CREATININE 0.76 05/24/2020 1414      Component Value Date/Time   CALCIUM 9.2 01/18/2022 1002   ALKPHOS 65 01/18/2022 1002   AST 18 01/18/2022 1002   ALT 19 01/18/2022 1002   BILITOT 0.5 01/18/2022 1002       RADIOGRAPHIC STUDIES:  DG Chest Port 1V same Day  Result Date: 01/01/2022 CLINICAL DATA:  Hypoxemia EXAM: PORTABLE CHEST 1 VIEW COMPARISON:  12/29/2021 FINDINGS: Progression of left lower lobe airspace disease. Probable small left effusion. Right lung clear.  Negative for heart failure. Progression of left lower lobe airspace disease. IMPRESSION: No active disease. Electronically Signed   By: Franchot Gallo M.D.   On: 01/01/2022 13:32   DG Chest 2 View  Result Date: 12/29/2021 CLINICAL DATA:  History of lung cancer. Status post chemotherapy. Presents with chills. EXAM: CHEST - 2 VIEW COMPARISON:  10/28/2021. FINDINGS: Heart size and mediastinal contours are unremarkable. Small left pleural effusion. No airspace consolidation. Known left lower lobe lung nodule is again noted measuring 2.5 cm. Asymmetric increase interstitial prominence is noted within the left lung which appears similar to the previous imaging. No signs of atelectasis or pneumothorax. The visualized osseous structures appear intact. IMPRESSION: 1. No acute cardiopulmonary abnormalities. 2. Left lung nodule compatible with known lung cancer. 3. Unchanged small left pleural effusion with asymmetric interstitial prominence within the left lung. Electronically Signed   By: Kerby Moors M.D.   On: 12/29/2021 17:42      ASSESSMENT/PLAN:  This is a very pleasant 87 year old female with: 1) history of stage Ia (T1b, N0, M0) ER positive, PR positive, and HER2 negative left-sided breast cancer.  She was diagnosed in  August  2021.  She is status post left breast lumpectomy followed by radioactive seed localization and adjuvant radiotherapy.  She is currently on tamoxifen. 2) stage IV (T1c, N3, M1C) non-small cell lung cancer, adenocarcinoma.  She presented with a left lower lobe lung nodule in addition to bilateral mediastinal lymphadenopathy.  She also has bilateral pulmonary nodules and multiple metastatic bone lesions.  She was diagnosed in May 2023.  Her PD-L1 expression is 70%.  She is negative for any actionable mutations by Guardant360.  Foundation 1 showed she is positive for K-ras G12 C which can be targeted in the second line setting in the future.   The patient is currently undergoing systemic chemotherapy with carboplatin for an AUC of 5, Alimta 500 mg per metered squared, Keytruda 200 mg IV every 3 weeks.  The patient is status post 1 cycle.  She was hospitalized for fever, chills, nausea, and vomiting.  No source of infection was found.  She is feeling better at this time but not at baseline.  The patient's AST and ALT were elevated on her weekly labs on 01/04/2022. This could be secondary to immunotherapy, medications administered during her hospitalization, and she also does take 650 of Tylenol twice daily.  Her LFTs on her next weekly lab appointment showed improvement but they continue to be mildly elevated.  Her LFTs are back to baseline today.  She completed his steroid taper last week.  The patient was seen with Dr. Julien Nordmann.  Labs were reviewed. Dr. Julien Nordmann recommend that she proceed with cycle #2 is scheduled at the same dose.   We will see her back for follow-up visit in 3 weeks for evaluation and repeat blood work before undergoing cycle #3.  We are hopeful that the patient will not have recurrent episodes of fever or pyrexia following cycle #2.  The patient was encouraged to call the clinic if she develops any new or worsening symptoms in the interval. Advised to call early to help facilitate  management.  We will monitor her labs closely on a weekly basis.   We will see her back for follow-up visit in 3 weeks for evaluation and repeat blood work before starting cycle #3.  The patient was advised to avoid Tylenol while her liver enzymes are elevated.    She will continue undergoing palliative radiation to the painful metastatic bone lesions under the care of Dr. Lisbeth Renshaw.  They are working on authorization with her insurance company.   Regarding her crusty eyes in the morning, advised her to use a warm washcloth and if no improvement to see her PCP or her ophthalmologist.  Dr. Julien Nordmann also encouraged the patient to follow-up with Dr. Lamonte Sakai or her PCP next week regarding her persistent cough for additional recommendations.  The patient was advised to call immediately if she has any concerning symptoms in the interval. The patient voices understanding of current disease status and treatment options and is in agreement with the current care plan. All questions were answered. The patient knows to call the clinic with any problems, questions or concerns. We can certainly see the patient much sooner if necessary    No orders of the defined types were placed in this encounter.     Sheila Nicklas L Justyce Baby, PA-C 01/18/22  ADDENDUM: Hematology/Oncology Attending: I had a face-to-face encounter with the patient today.  I reviewed her records, lab and recommended her care plan.  This is a very pleasant 87 years old white female with history of stage Ia  breast cancer diagnosed in August 2021 and recent diagnosis of stage IV (T1c, N3, M1 C) non-small cell lung cancer, adenocarcinoma diagnosed in May 2023 with positive KRAS G12C mutation and PD-L1 expression of 70%.  The patient is currently undergoing systemic chemotherapy with carboplatin, Alimta and Keytruda status post 1 cycle.  She tolerated the first cycle of her treatment well except for fever started few days after the treatment.  She  improved significantly in the last 2 weeks and she is feeling fine and she had elevated liver enzymes that significantly improved. I recommended for the patient to proceed with cycle #2 today as planned. I will see her back for follow-up visit in 3 weeks for evaluation before starting cycle #3.  The patient was advised to call immediately or go to the emergency department if she develop any similar symptoms or any concerning symptoms in the interval. The total time spent in the appointment was 20 minutes. Disclaimer: This note was dictated with voice recognition software. Similar sounding words can inadvertently be transcribed and may be missed upon review. Eilleen Kempf, MD

## 2022-01-16 ENCOUNTER — Ambulatory Visit
Admission: RE | Admit: 2022-01-16 | Discharge: 2022-01-16 | Disposition: A | Discharge status: Home / Self Care | Payer: Medicare HMO | Source: Ambulatory Visit | Attending: Radiation Oncology | Admitting: Radiation Oncology

## 2022-01-16 ENCOUNTER — Other Ambulatory Visit: Payer: Self-pay

## 2022-01-16 ENCOUNTER — Telehealth: Payer: Self-pay

## 2022-01-16 DIAGNOSIS — C7951 Secondary malignant neoplasm of bone: Secondary | ICD-10-CM | POA: Diagnosis not present

## 2022-01-16 DIAGNOSIS — Z87891 Personal history of nicotine dependence: Secondary | ICD-10-CM | POA: Diagnosis not present

## 2022-01-16 DIAGNOSIS — C3432 Malignant neoplasm of lower lobe, left bronchus or lung: Secondary | ICD-10-CM | POA: Diagnosis not present

## 2022-01-16 DIAGNOSIS — Z510 Encounter for antineoplastic radiation therapy: Secondary | ICD-10-CM | POA: Diagnosis not present

## 2022-01-16 LAB — RAD ONC ARIA SESSION SUMMARY
Course Elapsed Days: 7
Plan Fractions Treated to Date: 5
Plan Fractions Treated to Date: 5
Plan Prescribed Dose Per Fraction: 2.5 Gy
Plan Prescribed Dose Per Fraction: 2.5 Gy
Plan Total Fractions Prescribed: 15
Plan Total Fractions Prescribed: 15
Plan Total Prescribed Dose: 37.5 Gy
Plan Total Prescribed Dose: 37.5 Gy
Reference Point Dosage Given to Date: 12.5 Gy
Reference Point Dosage Given to Date: 12.5 Gy
Reference Point Session Dosage Given: 2.5 Gy
Reference Point Session Dosage Given: 2.5 Gy
Session Number: 5

## 2022-01-16 NOTE — Telephone Encounter (Signed)
T/C to pt to f/up from 7/7 My Chart message.  Pt is still having some lightheadedness but has improved since last week.  She is increasing her fluids, monitoring her oxygen and is using her walker.   Her left eye continues to have morning discharge and was advised to apply warm compresses.  She has post nasal drip and a productive cough with pale green sputum.  She has a pulmonologist at [facility].  She does not feel she needs to be seen sooner than her scheduled appt on Wed but will call or contact us through My Chart if symptoms do not improve or become worse.

## 2022-01-17 ENCOUNTER — Other Ambulatory Visit: Payer: Self-pay

## 2022-01-17 ENCOUNTER — Ambulatory Visit
Admission: RE | Admit: 2022-01-17 | Discharge: 2022-01-17 | Disposition: A | Discharge status: Home / Self Care | Payer: Medicare HMO | Source: Ambulatory Visit | Attending: Radiation Oncology | Admitting: Radiation Oncology

## 2022-01-17 DIAGNOSIS — Z510 Encounter for antineoplastic radiation therapy: Secondary | ICD-10-CM | POA: Diagnosis not present

## 2022-01-17 DIAGNOSIS — C3432 Malignant neoplasm of lower lobe, left bronchus or lung: Secondary | ICD-10-CM | POA: Diagnosis not present

## 2022-01-17 DIAGNOSIS — Z87891 Personal history of nicotine dependence: Secondary | ICD-10-CM | POA: Diagnosis not present

## 2022-01-17 LAB — RAD ONC ARIA SESSION SUMMARY
Course Elapsed Days: 8
Plan Fractions Treated to Date: 6
Plan Fractions Treated to Date: 6
Plan Prescribed Dose Per Fraction: 2.5 Gy
Plan Prescribed Dose Per Fraction: 2.5 Gy
Plan Total Fractions Prescribed: 15
Plan Total Fractions Prescribed: 15
Plan Total Prescribed Dose: 37.5 Gy
Plan Total Prescribed Dose: 37.5 Gy
Reference Point Dosage Given to Date: 15 Gy
Reference Point Dosage Given to Date: 15 Gy
Reference Point Session Dosage Given: 2.5 Gy
Reference Point Session Dosage Given: 2.5 Gy
Session Number: 6

## 2022-01-17 MED FILL — Dexamethasone Sodium Phosphate Inj 100 MG/10ML: INTRAMUSCULAR | Qty: 1 | Status: AC

## 2022-01-17 MED FILL — Fosaprepitant Dimeglumine For IV Infusion 150 MG (Base Eq): INTRAVENOUS | Qty: 5 | Status: AC

## 2022-01-18 ENCOUNTER — Ambulatory Visit
Admission: RE | Admit: 2022-01-18 | Discharge: 2022-01-18 | Disposition: A | Discharge status: Home / Self Care | Payer: Medicare HMO | Source: Ambulatory Visit | Attending: Radiation Oncology | Admitting: Radiation Oncology

## 2022-01-18 ENCOUNTER — Other Ambulatory Visit: Payer: Self-pay

## 2022-01-18 ENCOUNTER — Inpatient Hospital Stay (HOSPITAL_BASED_OUTPATIENT_CLINIC_OR_DEPARTMENT_OTHER): Payer: Medicare HMO | Admitting: Physician Assistant

## 2022-01-18 ENCOUNTER — Inpatient Hospital Stay: Payer: Medicare HMO

## 2022-01-18 ENCOUNTER — Other Ambulatory Visit: Payer: Self-pay | Admitting: Family Medicine

## 2022-01-18 VITALS — BP 112/61 | HR 91 | Temp 97.7°F | Resp 18 | Ht 64.0 in | Wt 165.7 lb

## 2022-01-18 DIAGNOSIS — F1011 Alcohol abuse, in remission: Secondary | ICD-10-CM | POA: Diagnosis not present

## 2022-01-18 DIAGNOSIS — C7951 Secondary malignant neoplasm of bone: Secondary | ICD-10-CM | POA: Diagnosis not present

## 2022-01-18 DIAGNOSIS — Z79899 Other long term (current) drug therapy: Secondary | ICD-10-CM | POA: Diagnosis not present

## 2022-01-18 DIAGNOSIS — Z5111 Encounter for antineoplastic chemotherapy: Secondary | ICD-10-CM

## 2022-01-18 DIAGNOSIS — R918 Other nonspecific abnormal finding of lung field: Secondary | ICD-10-CM

## 2022-01-18 DIAGNOSIS — Z510 Encounter for antineoplastic radiation therapy: Secondary | ICD-10-CM | POA: Diagnosis not present

## 2022-01-18 DIAGNOSIS — K219 Gastro-esophageal reflux disease without esophagitis: Secondary | ICD-10-CM | POA: Diagnosis not present

## 2022-01-18 DIAGNOSIS — Z5112 Encounter for antineoplastic immunotherapy: Secondary | ICD-10-CM

## 2022-01-18 DIAGNOSIS — C3432 Malignant neoplasm of lower lobe, left bronchus or lung: Secondary | ICD-10-CM | POA: Diagnosis not present

## 2022-01-18 DIAGNOSIS — I10 Essential (primary) hypertension: Secondary | ICD-10-CM | POA: Diagnosis not present

## 2022-01-18 DIAGNOSIS — R590 Localized enlarged lymph nodes: Secondary | ICD-10-CM | POA: Diagnosis not present

## 2022-01-18 DIAGNOSIS — Z79810 Long term (current) use of selective estrogen receptor modulators (SERMs): Secondary | ICD-10-CM | POA: Diagnosis not present

## 2022-01-18 DIAGNOSIS — C3491 Malignant neoplasm of unspecified part of right bronchus or lung: Secondary | ICD-10-CM

## 2022-01-18 DIAGNOSIS — Z170 Estrogen receptor positive status [ER+]: Secondary | ICD-10-CM | POA: Diagnosis not present

## 2022-01-18 DIAGNOSIS — Z8673 Personal history of transient ischemic attack (TIA), and cerebral infarction without residual deficits: Secondary | ICD-10-CM | POA: Diagnosis not present

## 2022-01-18 DIAGNOSIS — E785 Hyperlipidemia, unspecified: Secondary | ICD-10-CM | POA: Diagnosis not present

## 2022-01-18 DIAGNOSIS — Z87891 Personal history of nicotine dependence: Secondary | ICD-10-CM | POA: Diagnosis not present

## 2022-01-18 DIAGNOSIS — J449 Chronic obstructive pulmonary disease, unspecified: Secondary | ICD-10-CM | POA: Diagnosis not present

## 2022-01-18 DIAGNOSIS — Z7902 Long term (current) use of antithrombotics/antiplatelets: Secondary | ICD-10-CM | POA: Diagnosis not present

## 2022-01-18 DIAGNOSIS — M1990 Unspecified osteoarthritis, unspecified site: Secondary | ICD-10-CM | POA: Diagnosis not present

## 2022-01-18 DIAGNOSIS — Z7982 Long term (current) use of aspirin: Secondary | ICD-10-CM | POA: Diagnosis not present

## 2022-01-18 DIAGNOSIS — R7989 Other specified abnormal findings of blood chemistry: Secondary | ICD-10-CM | POA: Diagnosis not present

## 2022-01-18 DIAGNOSIS — C50912 Malignant neoplasm of unspecified site of left female breast: Secondary | ICD-10-CM | POA: Diagnosis not present

## 2022-01-18 LAB — CMP (CANCER CENTER ONLY)
ALT: 19 U/L (ref 0–44)
AST: 18 U/L (ref 15–41)
Albumin: 3.3 g/dL — ABNORMAL LOW (ref 3.5–5.0)
Alkaline Phosphatase: 65 U/L (ref 38–126)
Anion gap: 5 (ref 5–15)
BUN: 10 mg/dL (ref 8–23)
CO2: 30 mmol/L (ref 22–32)
Calcium: 9.2 mg/dL (ref 8.9–10.3)
Chloride: 107 mmol/L (ref 98–111)
Creatinine: 0.69 mg/dL (ref 0.44–1.00)
GFR, Estimated: >60 mL/min (ref >60)
Glucose, Bld: 144 mg/dL — ABNORMAL HIGH (ref 70–99)
Potassium: 4.2 mmol/L (ref 3.5–5.1)
Sodium: 142 mmol/L (ref 135–145)
Total Bilirubin: 0.5 mg/dL (ref 0.3–1.2)
Total Protein: 5.9 g/dL — ABNORMAL LOW (ref 6.5–8.1)

## 2022-01-18 LAB — RAD ONC ARIA SESSION SUMMARY
Course Elapsed Days: 9
Plan Fractions Treated to Date: 7
Plan Fractions Treated to Date: 7
Plan Prescribed Dose Per Fraction: 2.5 Gy
Plan Prescribed Dose Per Fraction: 2.5 Gy
Plan Total Fractions Prescribed: 15
Plan Total Fractions Prescribed: 15
Plan Total Prescribed Dose: 37.5 Gy
Plan Total Prescribed Dose: 37.5 Gy
Reference Point Dosage Given to Date: 17.5 Gy
Reference Point Dosage Given to Date: 17.5 Gy
Reference Point Session Dosage Given: 2.5 Gy
Reference Point Session Dosage Given: 2.5 Gy
Session Number: 7

## 2022-01-18 LAB — CBC WITH DIFFERENTIAL (CANCER CENTER ONLY)
Abs Immature Granulocytes: 0.01 10*3/uL (ref 0.00–0.07)
Basophils Absolute: 0 10*3/uL (ref 0.0–0.1)
Basophils Relative: 1 %
Eosinophils Absolute: 0 10*3/uL (ref 0.0–0.5)
Eosinophils Relative: 1 %
HCT: 35.1 % — ABNORMAL LOW (ref 36.0–46.0)
Hemoglobin: 11.8 g/dL — ABNORMAL LOW (ref 12.0–15.0)
Immature Granulocytes: 0 %
Lymphocytes Relative: 8 %
Lymphs Abs: 0.3 10*3/uL — ABNORMAL LOW (ref 0.7–4.0)
MCH: 32.2 pg (ref 26.0–34.0)
MCHC: 33.6 g/dL (ref 30.0–36.0)
MCV: 95.9 fL (ref 80.0–100.0)
Monocytes Absolute: 0.7 10*3/uL (ref 0.1–1.0)
Monocytes Relative: 17 %
Neutro Abs: 3.1 10*3/uL (ref 1.7–7.7)
Neutrophils Relative %: 73 %
Platelet Count: 319 10*3/uL (ref 150–400)
RBC: 3.66 MIL/uL — ABNORMAL LOW (ref 3.87–5.11)
RDW: 14.2 % (ref 11.5–15.5)
WBC Count: 4.3 10*3/uL (ref 4.0–10.5)
nRBC: 0 % (ref 0.0–0.2)

## 2022-01-18 MED ORDER — SODIUM CHLORIDE 0.9 % IV SOLN
380.0000 mg | Freq: Once | INTRAVENOUS | Status: AC
  Administered 2022-01-18: 380 mg via INTRAVENOUS
  Filled 2022-01-18: qty 38

## 2022-01-18 MED ORDER — SODIUM CHLORIDE 0.9 % IV SOLN: Freq: Once | INTRAVENOUS | Status: AC

## 2022-01-18 MED ORDER — SODIUM CHLORIDE 0.9 % IV SOLN
150.0000 mg | Freq: Once | INTRAVENOUS | Status: AC
  Administered 2022-01-18: 150 mg via INTRAVENOUS
  Filled 2022-01-18: qty 150

## 2022-01-18 MED ORDER — PALONOSETRON HCL INJECTION 0.25 MG/5ML
0.2500 mg | Freq: Once | INTRAVENOUS | Status: AC
  Administered 2022-01-18: 0.25 mg via INTRAVENOUS
  Filled 2022-01-18: qty 5

## 2022-01-18 MED ORDER — SODIUM CHLORIDE 0.9 % IV SOLN
200.0000 mg | Freq: Once | INTRAVENOUS | Status: AC
  Administered 2022-01-18: 200 mg via INTRAVENOUS
  Filled 2022-01-18: qty 200

## 2022-01-18 MED ORDER — SODIUM CHLORIDE 0.9 % IV SOLN
500.0000 mg/m2 | Freq: Once | INTRAVENOUS | Status: AC
  Administered 2022-01-18: 900 mg via INTRAVENOUS
  Filled 2022-01-18: qty 20

## 2022-01-18 MED ORDER — SODIUM CHLORIDE 0.9 % IV SOLN
10.0000 mg | Freq: Once | INTRAVENOUS | Status: AC
  Administered 2022-01-18: 10 mg via INTRAVENOUS
  Filled 2022-01-18: qty 10

## 2022-01-19 ENCOUNTER — Other Ambulatory Visit: Payer: Self-pay

## 2022-01-19 ENCOUNTER — Ambulatory Visit
Admission: RE | Admit: 2022-01-19 | Discharge: 2022-01-19 | Disposition: A | Discharge status: Home / Self Care | Payer: Medicare HMO | Source: Ambulatory Visit | Attending: Radiation Oncology | Admitting: Radiation Oncology

## 2022-01-19 DIAGNOSIS — Z510 Encounter for antineoplastic radiation therapy: Secondary | ICD-10-CM | POA: Diagnosis not present

## 2022-01-19 DIAGNOSIS — Z87891 Personal history of nicotine dependence: Secondary | ICD-10-CM | POA: Diagnosis not present

## 2022-01-19 DIAGNOSIS — C3432 Malignant neoplasm of lower lobe, left bronchus or lung: Secondary | ICD-10-CM | POA: Diagnosis not present

## 2022-01-19 LAB — RAD ONC ARIA SESSION SUMMARY
Course Elapsed Days: 10
Plan Fractions Treated to Date: 8
Plan Fractions Treated to Date: 8
Plan Prescribed Dose Per Fraction: 2.5 Gy
Plan Prescribed Dose Per Fraction: 2.5 Gy
Plan Total Fractions Prescribed: 15
Plan Total Fractions Prescribed: 15
Plan Total Prescribed Dose: 37.5 Gy
Plan Total Prescribed Dose: 37.5 Gy
Reference Point Dosage Given to Date: 20 Gy
Reference Point Dosage Given to Date: 20 Gy
Reference Point Session Dosage Given: 2.5 Gy
Reference Point Session Dosage Given: 2.5 Gy
Session Number: 8

## 2022-01-20 ENCOUNTER — Ambulatory Visit
Admission: RE | Admit: 2022-01-20 | Discharge: 2022-01-20 | Disposition: A | Discharge status: Home / Self Care | Payer: Medicare HMO | Source: Ambulatory Visit | Attending: Radiation Oncology | Admitting: Radiation Oncology

## 2022-01-20 ENCOUNTER — Other Ambulatory Visit: Payer: Self-pay

## 2022-01-20 DIAGNOSIS — C3432 Malignant neoplasm of lower lobe, left bronchus or lung: Secondary | ICD-10-CM | POA: Diagnosis not present

## 2022-01-20 DIAGNOSIS — Z510 Encounter for antineoplastic radiation therapy: Secondary | ICD-10-CM | POA: Diagnosis not present

## 2022-01-20 DIAGNOSIS — Z87891 Personal history of nicotine dependence: Secondary | ICD-10-CM | POA: Diagnosis not present

## 2022-01-20 LAB — RAD ONC ARIA SESSION SUMMARY
Course Elapsed Days: 11
Plan Fractions Treated to Date: 9
Plan Fractions Treated to Date: 9
Plan Prescribed Dose Per Fraction: 2.5 Gy
Plan Prescribed Dose Per Fraction: 2.5 Gy
Plan Total Fractions Prescribed: 15
Plan Total Fractions Prescribed: 15
Plan Total Prescribed Dose: 37.5 Gy
Plan Total Prescribed Dose: 37.5 Gy
Reference Point Dosage Given to Date: 22.5 Gy
Reference Point Dosage Given to Date: 22.5 Gy
Reference Point Session Dosage Given: 2.5 Gy
Reference Point Session Dosage Given: 2.5 Gy
Session Number: 9

## 2022-01-23 ENCOUNTER — Other Ambulatory Visit: Payer: Self-pay

## 2022-01-23 ENCOUNTER — Ambulatory Visit
Admission: RE | Admit: 2022-01-23 | Discharge: 2022-01-23 | Disposition: A | Discharge status: Home / Self Care | Payer: Medicare HMO | Source: Ambulatory Visit | Attending: Radiation Oncology | Admitting: Radiation Oncology

## 2022-01-23 DIAGNOSIS — C3432 Malignant neoplasm of lower lobe, left bronchus or lung: Secondary | ICD-10-CM | POA: Diagnosis not present

## 2022-01-23 DIAGNOSIS — C7951 Secondary malignant neoplasm of bone: Secondary | ICD-10-CM | POA: Diagnosis not present

## 2022-01-23 DIAGNOSIS — Z510 Encounter for antineoplastic radiation therapy: Secondary | ICD-10-CM | POA: Diagnosis not present

## 2022-01-23 DIAGNOSIS — Z87891 Personal history of nicotine dependence: Secondary | ICD-10-CM | POA: Diagnosis not present

## 2022-01-23 LAB — RAD ONC ARIA SESSION SUMMARY
Course Elapsed Days: 14
Plan Fractions Treated to Date: 10
Plan Fractions Treated to Date: 10
Plan Prescribed Dose Per Fraction: 2.5 Gy
Plan Prescribed Dose Per Fraction: 2.5 Gy
Plan Total Fractions Prescribed: 15
Plan Total Fractions Prescribed: 15
Plan Total Prescribed Dose: 37.5 Gy
Plan Total Prescribed Dose: 37.5 Gy
Reference Point Dosage Given to Date: 25 Gy
Reference Point Dosage Given to Date: 25 Gy
Reference Point Session Dosage Given: 2.5 Gy
Reference Point Session Dosage Given: 2.5 Gy
Session Number: 10

## 2022-01-24 ENCOUNTER — Inpatient Hospital Stay: Payer: Medicare HMO

## 2022-01-24 ENCOUNTER — Other Ambulatory Visit: Payer: Self-pay

## 2022-01-24 ENCOUNTER — Ambulatory Visit (INDEPENDENT_AMBULATORY_CARE_PROVIDER_SITE_OTHER): Payer: Medicare HMO | Admitting: Family Medicine

## 2022-01-24 ENCOUNTER — Ambulatory Visit
Admission: RE | Admit: 2022-01-24 | Discharge: 2022-01-24 | Disposition: A | Discharge status: Home / Self Care | Payer: Medicare HMO | Source: Ambulatory Visit | Attending: Radiation Oncology | Admitting: Radiation Oncology

## 2022-01-24 ENCOUNTER — Encounter: Payer: Self-pay | Admitting: Family Medicine

## 2022-01-24 ENCOUNTER — Ambulatory Visit: Payer: Medicare HMO | Admitting: Radiation Oncology

## 2022-01-24 VITALS — BP 110/60 | HR 88 | Temp 98.3°F | Ht 64.0 in | Wt 163.8 lb

## 2022-01-24 DIAGNOSIS — Z170 Estrogen receptor positive status [ER+]: Secondary | ICD-10-CM | POA: Diagnosis not present

## 2022-01-24 DIAGNOSIS — C3491 Malignant neoplasm of unspecified part of right bronchus or lung: Secondary | ICD-10-CM | POA: Diagnosis not present

## 2022-01-24 DIAGNOSIS — J410 Simple chronic bronchitis: Secondary | ICD-10-CM | POA: Diagnosis not present

## 2022-01-24 DIAGNOSIS — K219 Gastro-esophageal reflux disease without esophagitis: Secondary | ICD-10-CM | POA: Diagnosis not present

## 2022-01-24 DIAGNOSIS — Z5111 Encounter for antineoplastic chemotherapy: Secondary | ICD-10-CM | POA: Diagnosis not present

## 2022-01-24 DIAGNOSIS — E782 Mixed hyperlipidemia: Secondary | ICD-10-CM

## 2022-01-24 DIAGNOSIS — Z8673 Personal history of transient ischemic attack (TIA), and cerebral infarction without residual deficits: Secondary | ICD-10-CM | POA: Diagnosis not present

## 2022-01-24 DIAGNOSIS — I10 Essential (primary) hypertension: Secondary | ICD-10-CM | POA: Diagnosis not present

## 2022-01-24 DIAGNOSIS — J9611 Chronic respiratory failure with hypoxia: Secondary | ICD-10-CM

## 2022-01-24 DIAGNOSIS — M1990 Unspecified osteoarthritis, unspecified site: Secondary | ICD-10-CM | POA: Diagnosis not present

## 2022-01-24 DIAGNOSIS — R918 Other nonspecific abnormal finding of lung field: Secondary | ICD-10-CM

## 2022-01-24 DIAGNOSIS — Z7982 Long term (current) use of aspirin: Secondary | ICD-10-CM | POA: Diagnosis not present

## 2022-01-24 DIAGNOSIS — J449 Chronic obstructive pulmonary disease, unspecified: Secondary | ICD-10-CM | POA: Diagnosis not present

## 2022-01-24 DIAGNOSIS — C50912 Malignant neoplasm of unspecified site of left female breast: Secondary | ICD-10-CM | POA: Diagnosis not present

## 2022-01-24 DIAGNOSIS — R531 Weakness: Secondary | ICD-10-CM | POA: Diagnosis not present

## 2022-01-24 DIAGNOSIS — Z79810 Long term (current) use of selective estrogen receptor modulators (SERMs): Secondary | ICD-10-CM | POA: Diagnosis not present

## 2022-01-24 DIAGNOSIS — C3432 Malignant neoplasm of lower lobe, left bronchus or lung: Secondary | ICD-10-CM | POA: Diagnosis not present

## 2022-01-24 DIAGNOSIS — R590 Localized enlarged lymph nodes: Secondary | ICD-10-CM | POA: Diagnosis not present

## 2022-01-24 DIAGNOSIS — F1011 Alcohol abuse, in remission: Secondary | ICD-10-CM | POA: Diagnosis not present

## 2022-01-24 DIAGNOSIS — C7951 Secondary malignant neoplasm of bone: Secondary | ICD-10-CM | POA: Diagnosis not present

## 2022-01-24 DIAGNOSIS — E785 Hyperlipidemia, unspecified: Secondary | ICD-10-CM | POA: Diagnosis not present

## 2022-01-24 DIAGNOSIS — Z7902 Long term (current) use of antithrombotics/antiplatelets: Secondary | ICD-10-CM | POA: Diagnosis not present

## 2022-01-24 DIAGNOSIS — Z79899 Other long term (current) drug therapy: Secondary | ICD-10-CM | POA: Diagnosis not present

## 2022-01-24 DIAGNOSIS — R7989 Other specified abnormal findings of blood chemistry: Secondary | ICD-10-CM | POA: Diagnosis not present

## 2022-01-24 DIAGNOSIS — Z510 Encounter for antineoplastic radiation therapy: Secondary | ICD-10-CM | POA: Diagnosis not present

## 2022-01-24 DIAGNOSIS — Z87891 Personal history of nicotine dependence: Secondary | ICD-10-CM | POA: Diagnosis not present

## 2022-01-24 LAB — CMP (CANCER CENTER ONLY)
ALT: 13 U/L (ref 0–44)
AST: 17 U/L (ref 15–41)
Albumin: 3.4 g/dL — ABNORMAL LOW (ref 3.5–5.0)
Alkaline Phosphatase: 53 U/L (ref 38–126)
Anion gap: 7 (ref 5–15)
BUN: 19 mg/dL (ref 8–23)
CO2: 28 mmol/L (ref 22–32)
Calcium: 9.3 mg/dL (ref 8.9–10.3)
Chloride: 102 mmol/L (ref 98–111)
Creatinine: 0.61 mg/dL (ref 0.44–1.00)
GFR, Estimated: >60 mL/min (ref >60)
Glucose, Bld: 93 mg/dL (ref 70–99)
Potassium: 3.8 mmol/L (ref 3.5–5.1)
Sodium: 137 mmol/L (ref 135–145)
Total Bilirubin: 0.5 mg/dL (ref 0.3–1.2)
Total Protein: 6.1 g/dL — ABNORMAL LOW (ref 6.5–8.1)

## 2022-01-24 LAB — CBC WITH DIFFERENTIAL (CANCER CENTER ONLY)
Abs Immature Granulocytes: 0.01 10*3/uL (ref 0.00–0.07)
Basophils Absolute: 0 10*3/uL (ref 0.0–0.1)
Basophils Relative: 1 %
Eosinophils Absolute: 0.2 10*3/uL (ref 0.0–0.5)
Eosinophils Relative: 5 %
HCT: 33.8 % — ABNORMAL LOW (ref 36.0–46.0)
Hemoglobin: 11.5 g/dL — ABNORMAL LOW (ref 12.0–15.0)
Immature Granulocytes: 0 %
Lymphocytes Relative: 9 %
Lymphs Abs: 0.3 10*3/uL — ABNORMAL LOW (ref 0.7–4.0)
MCH: 31.9 pg (ref 26.0–34.0)
MCHC: 34 g/dL (ref 30.0–36.0)
MCV: 93.9 fL (ref 80.0–100.0)
Monocytes Absolute: 0.2 10*3/uL (ref 0.1–1.0)
Monocytes Relative: 6 %
Neutro Abs: 2.4 10*3/uL (ref 1.7–7.7)
Neutrophils Relative %: 79 %
Platelet Count: 167 10*3/uL (ref 150–400)
RBC: 3.6 MIL/uL — ABNORMAL LOW (ref 3.87–5.11)
RDW: 13.5 % (ref 11.5–15.5)
WBC Count: 3 10*3/uL — ABNORMAL LOW (ref 4.0–10.5)
nRBC: 0 % (ref 0.0–0.2)

## 2022-01-24 LAB — RAD ONC ARIA SESSION SUMMARY
Course Elapsed Days: 15
Plan Fractions Treated to Date: 11
Plan Fractions Treated to Date: 11
Plan Prescribed Dose Per Fraction: 2.5 Gy
Plan Prescribed Dose Per Fraction: 2.5 Gy
Plan Total Fractions Prescribed: 15
Plan Total Fractions Prescribed: 15
Plan Total Prescribed Dose: 37.5 Gy
Plan Total Prescribed Dose: 37.5 Gy
Reference Point Dosage Given to Date: 27.5 Gy
Reference Point Dosage Given to Date: 27.5 Gy
Reference Point Session Dosage Given: 2.5 Gy
Reference Point Session Dosage Given: 2.5 Gy
Session Number: 11

## 2022-01-24 NOTE — Progress Notes (Signed)
Phone [phone] In person visit   Subjective:   Sheila Shields is a 87 y.o. year old very pleasant female patient who presents for/with See problem oriented charting Chief Complaint  Patient presents with   Follow-up    Pt is here to follow up from hospital visit and discuss home health.   Past Medical History-  Patient Active Problem List   Diagnosis Date Noted   Adenocarcinoma of right lung, stage 4 ([place]) 12/21/2021    Priority: High   Pulmonary nodules/lesions, multiple 11/15/2021    Priority: High   Chronic respiratory failure ([place]) 10/28/2021    Priority: High   Mild aortic stenosis 05/24/2020    Priority: High   Malignant neoplasm of upper-outer quadrant of left breast in female, estrogen receptor positive ([place]) 02/12/2020    Priority: High   Brain aneurysm 12/30/2018    Priority: High   Chronic cough 10/11/2015    Priority: High   COPD (chronic obstructive pulmonary disease) ([place]) 11/18/2014    Priority: High   Insomnia 05/24/2020    Priority: Medium    HYPERGLYCEMIA 08/27/2007    Priority: Medium    Gastroesophageal reflux disease 05/01/2007    Priority: Medium    Hyperlipemia 12/17/2006    Priority: Medium    Essential hypertension 12/17/2006    Priority: Medium    Encounter for antineoplastic chemotherapy 01/18/2022    Priority: Low   Encounter for antineoplastic immunotherapy 12/21/2021    Priority: Low   DOE (dyspnea on exertion) 10/28/2021    Priority: Low   Former smoker 05/22/2019    Priority: Low   Aortic atherosclerosis ([place]) 04/11/2018    Priority: Low   Allergic rhinitis 11/09/2017    Priority: Low   Arm mass, left 04/16/2017    Priority: Low   Iron deficiency anemia, unspecified 01/20/2011    Priority: Low   Arthropathy 02/23/2010    Priority: Low   Alcoholism in remission ([place]) 08/27/2007    Priority: Low    Medications- reviewed and updated Current Outpatient Medications  Medication Sig Dispense Refill   acetaminophen  (TYLENOL) 650 MG CR tablet Take 1,300 mg by mouth 2 (two) times daily.     albuterol (PROAIR HFA) 108 (90 Base) MCG/ACT inhaler INHALE 2 PUFFS EVERY 4 HOURS AS NEEDED FOR COUGHING SPELLS 18 g 5   aspirin 325 MG tablet Take 1 tablet (325 mg total) by mouth daily. 30 tablet 3   azelastine (ASTELIN) 0.1 % nasal spray Place 2 sprays into both nostrils 2 (two) times daily. Use in each nostril as directed 30 mL 12   benzonatate (TESSALON) 200 MG capsule Take 1 capsule (200 mg total) by mouth 3 (three) times daily as needed for cough. 30 capsule 1   Cholecalciferol (VITAMIN D) 50 MCG (2000 UT) tablet Take 2,000 Units by mouth daily.     clopidogrel (PLAVIX) 75 MG tablet Take 0.5 tablets (37.5 mg total) by mouth daily. 46 tablet 3   Coenzyme Q10 300 MG CAPS Take 300 mg by mouth daily.     fexofenadine (ALLEGRA) 180 MG tablet Take 180 mg by mouth daily.     fluticasone (FLONASE) 50 MCG/ACT nasal spray Place 2 sprays into both nostrils daily. 16 g 2   folic acid (FOLVITE) 1 MG tablet Take 1 tablet (1 mg total) by mouth daily. 30 tablet 2   Glycopyrrolate-Formoterol (BEVESPI AEROSPHERE) 9-4.8 MCG/ACT AERO Inhale 2 puffs into the lungs 2 (two) times daily. 10.7 g 5   montelukast (SINGULAIR) 10  MG tablet Take 1 tablet (10 mg total) by mouth at bedtime. 30 tablet 5   Multiple Vitamin (MULTIVITAMIN WITH MINERALS) TABS tablet Take 1 tablet by mouth daily.     pantoprazole (PROTONIX) 40 MG tablet Take 1 tablet (40 mg total) by mouth daily. 30 tablet 5   prochlorperazine (COMPAZINE) 10 MG tablet Take 1 tablet (10 mg total) by mouth every 6 (six) hours as needed. (Patient taking differently: Take 10 mg by mouth every 6 (six) hours as needed for nausea.) 30 tablet 2   Propylene Glycol (SYSTANE BALANCE OP) Place 1 drop into both eyes daily.     rosuvastatin (CRESTOR) 20 MG tablet Take 1 tablet (20 mg total) by mouth daily. 90 tablet 3   tamoxifen (NOLVADEX) 20 MG tablet Take 1 tablet (20 mg total) by mouth daily. 90  tablet 3   traMADol (ULTRAM) 50 MG tablet TAKE ONE TABLET BY MOUTH EVERY 8 HOURS AS NEEDED FOR PAIN FOR UP TO 5 DAYS (DO NOT DRIVE FOR 8 HOURS AFTER TAKING) 45 tablet 5   traZODone (DESYREL) 50 MG tablet TAKE 1/2- 1 TABLET BY MOUTH EVERY NIGHT AT BEDTIME AS NEEDED SLEEP 30 tablet 3   vitamin B-12 (CYANOCOBALAMIN) 1000 MCG tablet Take 1,000 mcg by mouth daily.     No current facility-administered medications for this visit.     Objective:  BP 110/60   Pulse 88   Temp 98.3 F (36.8 C)   Ht 5\' 4"  (1.626 m)   Wt 163 lb 12.8 oz (74.3 kg)   SpO2 92%   BMI 28.12 kg/m  on 3L oxygen Gen: NAD, resting comfortably Mild pharyngeal drainage/cobblestoning despite flonase and allegra and singulair CV: RRR stable murmur  lungs: CTAB no crackles, wheeze, rhonchi Abdomen: soft/nontender/nondistended/normal bowel sounds.  Ext: trace edema Skin: warm, dry Neuro: Walks with walker    Assessment and Plan   #Social update-remains in good spirits overall-her AA friends even came over for a "meeting" which was special to her.  Has 87th birthday in 4 days!  #Hospital follow-up S: Patient with COPD and stage IV non-small cell lung cancer diagnosed May 2023 presented to the hospital on December 29, 2021 and was discharged 5 days later on January 03, 2022 after presenting with febrile illness, chills and nausea after starting chemotherapy-no clear etiology was found-though COPD exacerbation may have contributed.  Blood cultures were negative.  White blood count was normal.  Procalcitonin was only 0.15.  Urinalysis without obvious UTI and culture with no significant growth.  Chest x-ray without pneumonia.  Patient was started on broad-spectrum antibiotics with vancomycin, cefepime, Flagyl these were eventually discontinued after discussing with oncology  Patient has baseline chronic respiratory failure with hypoxia but had acute worsening-some concern for COPD exacerbation.  Did have some wheezing.  Venous  thromboembolism/pulmonary embolism was not suspected.  She was treated with nebulizer and steroids and was feeling much better by time of discharge down to 3 L of oxygen which was closer to the 2 L she typically uses  There was some concern for chronic diastolic CHF exacerbation and was given Lasix for a few days-volume status improved  For hypertension she was continued on her home medications but had hydrochlorothiazide discontinued at discharge along with potassium to treat with this  Today reports dealing with ongoing cough productive and pale. Also has sore throat (did not have in hospital and gradually worsening)- wonders if related to radiation treatment or illness. Started radiation in July on the 5th. Thankfully  with 2nd infusion not hospitalized on July 12th for chemotherapy. Strength gradually improving- working with PT and OT (we will sign for orders) A/P: Hospital follow-up for febrile illness after chemotherapy without clear etiology other than possibly COPD exacerbation provoked by viral illness.   -With debility and lack of cancer she does need to be set up with physical therapy and Occupational Therapy and this was ordered  #COPD exacerbation/ongoing cough -Had acute worsening of chronic respiratory failure which improved but still discharged on 3 L of oxygen which she is wearing today and will continue - Element of CHF exacerbation appear to improve on Lasix for a few days-no longer on this - COPD exacerbation improved with steroids and broad-spectrum antibiotics which were later discontinued - She does have some lingering cough but seems to be improving and not experiencing wheeze-we opted to monitor for now without further treatment.  Cancer itself could contribute to symptoms -Also continue pantoprazole which could help with reflux element for cough -Already on Singulair, Allegra, Flonase for allergies-do not think we can help further with allergies at this time  #Stage IV  non-small cell lung cancer diagnosed May 2023-has received chemotherapy in 2 sessions.  Follows with Dr. Julien Nordmann.  Radiation treatment was started this month-she will complete therapy   #History of brain aneurysm-follows with Dr. Estanislado Pandy- half dose plavix, full dose aspirin will be continued  #hypertension-well-controlled on no medication at this time-previously on hydrochlorothiazide 12.5 mg-continue to monitor without medication    #hyperlipidemia-reasonable control rosuvastatin 20 mg daily-continue current medication.  Myalgias on simvastatin in the past and did not tolerate atorvastatin-due for lipid panel but has had recent labs with oncology and will have labs today-we opted to hold off on repeating at this time  #Mild anemia and leukopenia noted on labs after visit today-oncology will follow closely and could be related to treatment   #Prediabetes-next labs we need to consider updating A1c Lab Results  Component Value Date   HGBA1C 5.9 05/26/2021   HGBA1C 5.8 11/19/2019   HGBA1C 5.8 05/22/2019   Recommended follow up: Return for next already scheduled visit or sooner if needed.  In November Future Appointments  Date Time Provider [place]  01/25/2022  1:15 PM Kyung Rudd, MD [facility] None  01/26/2022  1:15 PM Kyung Rudd, MD [facility] None  01/27/2022  1:15 PM Kyung Rudd, MD [place] None  01/30/2022  1:15 PM Kyung Rudd, MD [facility] None  01/31/2022  2:30 PM [facility] None  02/08/2022  8:45 AM [facility] None  02/08/2022  9:15 AM Curt Bears, MD [facility] None  02/08/2022 10:15 AM [facility] INFUSION [facility] None  02/10/2022 10:20 AM [facility] DIAG TOMO 1 [facility]  02/15/2022  2:30 PM [facility] None  02/22/2022  2:45 PM [facility] None  03/01/2022  8:30 AM [facility] None  03/01/2022  9:00 AM Curt Bears, MD [facility] None  03/01/2022 10:15 AM  [facility] INFUSION [facility] None  03/08/2022  2:15 PM [facility] None  03/15/2022  2:00 PM [facility] None  03/22/2022 11:00 AM [facility] None  03/22/2022 11:30 AM Curt Bears, MD [facility] None  03/22/2022 12:30 PM [facility] INFUSION [facility] None  03/29/2022  2:15 PM [facility] None  04/04/2022  9:30 AM [facility] None  04/04/2022 10:00 AM Cira Rue K, NP [facility] None  04/05/2022  2:00 PM [facility] None  04/12/2022 10:00 AM [facility] None  04/12/2022 10:30  AM Heilingoetter, Cassandra L, PA-C [facility] None  04/12/2022 11:45 AM [facility] INFUSION [facility] None  05/25/2022  9:20 AM Marin Olp, MD [facility]  08/25/2022  9:30 AM [facility] HEALTH COACH [facility]   Lab/Order associations:   ICD-10-CM   1. Simple chronic bronchitis ([place])  J41.0 Ambulatory referral to Home Health    2. Adenocarcinoma of right lung, stage 4 (H[facility])  C34.91 Ambulatory referral to Home Health    3. General weakness  R53.1 Ambulatory referral to [place]    4. Chronic respiratory failure with hypoxia (H[facility])  J96.11     5. Essential hypertension  I10     6. Mixed hyperlipidemia  E78.2      Time Spent: 42 minutes of total time (10:40 AM-11:15 AM, 8:27 PM-8:34 PM) was spent on the date of the encounter performing the following actions: chart review prior to seeing the patient-including review of discharge summary, obtaining history, performing a medically necessary exam, counseling on the treatment plan as well as patient questions, placing orders particularly for home health, and documenting in our EHR.   Return precautions advised.  Garret Reddish, MD

## 2022-01-24 NOTE — Patient Instructions (Addendum)
Glad you are doing well all things considered  Ok to try chloraseptic spray for throat discomfort  Stay off blood pressure medicine for now  Recommended follow up: Return for next already scheduled visit or sooner if needed.

## 2022-01-25 ENCOUNTER — Other Ambulatory Visit: Payer: Self-pay

## 2022-01-25 ENCOUNTER — Ambulatory Visit
Admission: RE | Admit: 2022-01-25 | Discharge: 2022-01-25 | Disposition: A | Discharge status: Home / Self Care | Payer: Medicare HMO | Source: Ambulatory Visit | Attending: Radiation Oncology | Admitting: Radiation Oncology

## 2022-01-25 ENCOUNTER — Ambulatory Visit: Payer: Medicare HMO | Admitting: Radiation Oncology

## 2022-01-25 DIAGNOSIS — Z87891 Personal history of nicotine dependence: Secondary | ICD-10-CM | POA: Diagnosis not present

## 2022-01-25 DIAGNOSIS — C3432 Malignant neoplasm of lower lobe, left bronchus or lung: Secondary | ICD-10-CM | POA: Diagnosis not present

## 2022-01-25 DIAGNOSIS — Z510 Encounter for antineoplastic radiation therapy: Secondary | ICD-10-CM | POA: Diagnosis not present

## 2022-01-25 LAB — RAD ONC ARIA SESSION SUMMARY
Course Elapsed Days: 16
Plan Fractions Treated to Date: 12
Plan Fractions Treated to Date: 12
Plan Prescribed Dose Per Fraction: 2.5 Gy
Plan Prescribed Dose Per Fraction: 2.5 Gy
Plan Total Fractions Prescribed: 15
Plan Total Fractions Prescribed: 15
Plan Total Prescribed Dose: 37.5 Gy
Plan Total Prescribed Dose: 37.5 Gy
Reference Point Dosage Given to Date: 30 Gy
Reference Point Dosage Given to Date: 30 Gy
Reference Point Session Dosage Given: 2.5 Gy
Reference Point Session Dosage Given: 2.5 Gy
Session Number: 12

## 2022-01-26 ENCOUNTER — Telehealth: Payer: Self-pay | Admitting: Family Medicine

## 2022-01-26 ENCOUNTER — Ambulatory Visit
Admission: RE | Admit: 2022-01-26 | Discharge: 2022-01-26 | Disposition: A | Discharge status: Home / Self Care | Payer: Medicare HMO | Source: Ambulatory Visit | Attending: Radiation Oncology | Admitting: Radiation Oncology

## 2022-01-26 ENCOUNTER — Ambulatory Visit: Payer: Medicare HMO | Admitting: Radiation Oncology

## 2022-01-26 ENCOUNTER — Other Ambulatory Visit: Payer: Self-pay

## 2022-01-26 DIAGNOSIS — Z7982 Long term (current) use of aspirin: Secondary | ICD-10-CM | POA: Diagnosis not present

## 2022-01-26 DIAGNOSIS — Z510 Encounter for antineoplastic radiation therapy: Secondary | ICD-10-CM | POA: Diagnosis not present

## 2022-01-26 DIAGNOSIS — I110 Hypertensive heart disease with heart failure: Secondary | ICD-10-CM | POA: Diagnosis not present

## 2022-01-26 DIAGNOSIS — A419 Sepsis, unspecified organism: Secondary | ICD-10-CM | POA: Diagnosis not present

## 2022-01-26 DIAGNOSIS — J449 Chronic obstructive pulmonary disease, unspecified: Secondary | ICD-10-CM | POA: Diagnosis not present

## 2022-01-26 DIAGNOSIS — I5032 Chronic diastolic (congestive) heart failure: Secondary | ICD-10-CM | POA: Diagnosis not present

## 2022-01-26 DIAGNOSIS — Z853 Personal history of malignant neoplasm of breast: Secondary | ICD-10-CM | POA: Diagnosis not present

## 2022-01-26 DIAGNOSIS — R69 Illness, unspecified: Secondary | ICD-10-CM | POA: Diagnosis not present

## 2022-01-26 DIAGNOSIS — Z9181 History of falling: Secondary | ICD-10-CM | POA: Diagnosis not present

## 2022-01-26 DIAGNOSIS — Z87891 Personal history of nicotine dependence: Secondary | ICD-10-CM | POA: Diagnosis not present

## 2022-01-26 DIAGNOSIS — C3432 Malignant neoplasm of lower lobe, left bronchus or lung: Secondary | ICD-10-CM | POA: Diagnosis not present

## 2022-01-26 DIAGNOSIS — C3491 Malignant neoplasm of unspecified part of right bronchus or lung: Secondary | ICD-10-CM | POA: Diagnosis not present

## 2022-01-26 DIAGNOSIS — R6520 Severe sepsis without septic shock: Secondary | ICD-10-CM | POA: Diagnosis not present

## 2022-01-26 DIAGNOSIS — J9611 Chronic respiratory failure with hypoxia: Secondary | ICD-10-CM | POA: Diagnosis not present

## 2022-01-26 LAB — RAD ONC ARIA SESSION SUMMARY
Course Elapsed Days: 17
Plan Fractions Treated to Date: 13
Plan Fractions Treated to Date: 13
Plan Prescribed Dose Per Fraction: 2.5 Gy
Plan Prescribed Dose Per Fraction: 2.5 Gy
Plan Total Fractions Prescribed: 15
Plan Total Fractions Prescribed: 15
Plan Total Prescribed Dose: 37.5 Gy
Plan Total Prescribed Dose: 37.5 Gy
Reference Point Dosage Given to Date: 32.5 Gy
Reference Point Dosage Given to Date: 32.5 Gy
Reference Point Session Dosage Given: 2.5 Gy
Reference Point Session Dosage Given: 2.5 Gy
Session Number: 13

## 2022-01-26 NOTE — Telephone Encounter (Signed)
Returned the call and provided VO.

## 2022-01-26 NOTE — Telephone Encounter (Signed)
..  Home Health Verbal Orders  Agency:  [facility]  Caller: Jerolyn Shin    ph# [phone]  Contact and title  Anderson [place] Davis-Occupational Therapist  Requesting PT to evaluate and treat OT for 1 x per week for  4 weeks  Reason for Request:  Compensatory ADL Retraining/Therapeutic Exercise/Transfer and Balance Training  Frequency:    OT for 1 x per week for  4 weeks HH needs F2F w/in last 30 days

## 2022-01-27 ENCOUNTER — Ambulatory Visit
Admission: RE | Admit: 2022-01-27 | Discharge: 2022-01-27 | Disposition: A | Discharge status: Home / Self Care | Payer: Medicare HMO | Source: Ambulatory Visit | Attending: Radiation Oncology | Admitting: Radiation Oncology

## 2022-01-27 ENCOUNTER — Other Ambulatory Visit: Payer: Self-pay

## 2022-01-27 ENCOUNTER — Telehealth: Payer: Self-pay | Admitting: Family Medicine

## 2022-01-27 ENCOUNTER — Ambulatory Visit: Payer: Medicare HMO | Admitting: Radiation Oncology

## 2022-01-27 DIAGNOSIS — C3432 Malignant neoplasm of lower lobe, left bronchus or lung: Secondary | ICD-10-CM | POA: Diagnosis not present

## 2022-01-27 DIAGNOSIS — Z510 Encounter for antineoplastic radiation therapy: Secondary | ICD-10-CM | POA: Diagnosis not present

## 2022-01-27 DIAGNOSIS — Z87891 Personal history of nicotine dependence: Secondary | ICD-10-CM | POA: Diagnosis not present

## 2022-01-27 LAB — RAD ONC ARIA SESSION SUMMARY
Course Elapsed Days: 18
Plan Fractions Treated to Date: 14
Plan Fractions Treated to Date: 14
Plan Prescribed Dose Per Fraction: 2.5 Gy
Plan Prescribed Dose Per Fraction: 2.5 Gy
Plan Total Fractions Prescribed: 15
Plan Total Fractions Prescribed: 15
Plan Total Prescribed Dose: 37.5 Gy
Plan Total Prescribed Dose: 37.5 Gy
Reference Point Dosage Given to Date: 35 Gy
Reference Point Dosage Given to Date: 35 Gy
Reference Point Session Dosage Given: 2.5 Gy
Reference Point Session Dosage Given: 2.5 Gy
Session Number: 14

## 2022-01-27 NOTE — Telephone Encounter (Signed)
Sheila Shields with [facility] requests to move PT Evaluation from today 01/27/22 to Tuesday 01/31/22. Requests to be called at ph# [phone] to be given a verbal okay.

## 2022-01-30 ENCOUNTER — Other Ambulatory Visit: Payer: Self-pay

## 2022-01-30 ENCOUNTER — Ambulatory Visit
Admission: RE | Admit: 2022-01-30 | Discharge: 2022-01-30 | Disposition: A | Discharge status: Home / Self Care | Payer: Medicare HMO | Source: Ambulatory Visit | Attending: Radiation Oncology | Admitting: Radiation Oncology

## 2022-01-30 ENCOUNTER — Encounter: Payer: Self-pay | Admitting: Radiation Oncology

## 2022-01-30 DIAGNOSIS — C3432 Malignant neoplasm of lower lobe, left bronchus or lung: Secondary | ICD-10-CM | POA: Diagnosis not present

## 2022-01-30 DIAGNOSIS — C7951 Secondary malignant neoplasm of bone: Secondary | ICD-10-CM | POA: Diagnosis not present

## 2022-01-30 DIAGNOSIS — Z87891 Personal history of nicotine dependence: Secondary | ICD-10-CM | POA: Diagnosis not present

## 2022-01-30 DIAGNOSIS — Z510 Encounter for antineoplastic radiation therapy: Secondary | ICD-10-CM | POA: Diagnosis not present

## 2022-01-30 DIAGNOSIS — J9611 Chronic respiratory failure with hypoxia: Secondary | ICD-10-CM | POA: Diagnosis not present

## 2022-01-30 DIAGNOSIS — J441 Chronic obstructive pulmonary disease with (acute) exacerbation: Secondary | ICD-10-CM | POA: Diagnosis not present

## 2022-01-30 LAB — RAD ONC ARIA SESSION SUMMARY
Course Elapsed Days: 21
Plan Fractions Treated to Date: 15
Plan Fractions Treated to Date: 15
Plan Prescribed Dose Per Fraction: 2.5 Gy
Plan Prescribed Dose Per Fraction: 2.5 Gy
Plan Total Fractions Prescribed: 15
Plan Total Fractions Prescribed: 15
Plan Total Prescribed Dose: 37.5 Gy
Plan Total Prescribed Dose: 37.5 Gy
Reference Point Dosage Given to Date: 37.5 Gy
Reference Point Dosage Given to Date: 37.5 Gy
Reference Point Session Dosage Given: 2.5 Gy
Reference Point Session Dosage Given: 2.5 Gy
Session Number: 15

## 2022-01-30 NOTE — Progress Notes (Signed)
                                                                                                                                                             Patient Name: Sheila Shields MRN: [id number] DOB: 01-27-1935 Referring Physician: Curt Bears (Profile Not Attached) Date of Service: 01/30/2022 [facility] [place]                                                        End Of Treatment Note  Diagnoses: C34.32-Malignant neoplasm of lower lobe, left bronchus or lung  Cancer Staging: Stage IV, NSCLC, adenocarcinoma of the LLL with bone metastases.     Intent: Palliative  Radiation Treatment Dates: 01/09/2022 through 01/30/2022 Site Technique Total Dose (Gy) Dose per Fx (Gy) Completed Fx Beam Energies  Ilium, Right: Pelvis_R Complex 37.5/37.5 2.5 15/15 6X, 10X, 15X  Thoracic Spine: Spine_T3 Complex 37.5/37.5 2.5 15/15 10X, 15X   Narrative: The patient tolerated radiation therapy relatively well. She developed fatigue and anticipated skin changes in the treatment field.   Plan: The patient will receive a call in about one month from the radiation oncology department. She will continue follow up with Dr. Julien Nordmann as well.   ________________________________________________    Carola Rhine, [facility]

## 2022-01-30 NOTE — Telephone Encounter (Signed)
Called and lm on [place] vm with VO.

## 2022-01-31 ENCOUNTER — Inpatient Hospital Stay: Payer: Medicare HMO

## 2022-01-31 DIAGNOSIS — E785 Hyperlipidemia, unspecified: Secondary | ICD-10-CM | POA: Diagnosis not present

## 2022-01-31 DIAGNOSIS — C3432 Malignant neoplasm of lower lobe, left bronchus or lung: Secondary | ICD-10-CM | POA: Diagnosis not present

## 2022-01-31 DIAGNOSIS — Z79899 Other long term (current) drug therapy: Secondary | ICD-10-CM | POA: Diagnosis not present

## 2022-01-31 DIAGNOSIS — C50912 Malignant neoplasm of unspecified site of left female breast: Secondary | ICD-10-CM | POA: Diagnosis not present

## 2022-01-31 DIAGNOSIS — Z8673 Personal history of transient ischemic attack (TIA), and cerebral infarction without residual deficits: Secondary | ICD-10-CM | POA: Diagnosis not present

## 2022-01-31 DIAGNOSIS — M1990 Unspecified osteoarthritis, unspecified site: Secondary | ICD-10-CM | POA: Diagnosis not present

## 2022-01-31 DIAGNOSIS — J449 Chronic obstructive pulmonary disease, unspecified: Secondary | ICD-10-CM | POA: Diagnosis not present

## 2022-01-31 DIAGNOSIS — I10 Essential (primary) hypertension: Secondary | ICD-10-CM | POA: Diagnosis not present

## 2022-01-31 DIAGNOSIS — Z79810 Long term (current) use of selective estrogen receptor modulators (SERMs): Secondary | ICD-10-CM | POA: Diagnosis not present

## 2022-01-31 DIAGNOSIS — Z5111 Encounter for antineoplastic chemotherapy: Secondary | ICD-10-CM | POA: Diagnosis not present

## 2022-01-31 DIAGNOSIS — K219 Gastro-esophageal reflux disease without esophagitis: Secondary | ICD-10-CM | POA: Diagnosis not present

## 2022-01-31 DIAGNOSIS — R918 Other nonspecific abnormal finding of lung field: Secondary | ICD-10-CM

## 2022-01-31 DIAGNOSIS — C7951 Secondary malignant neoplasm of bone: Secondary | ICD-10-CM | POA: Diagnosis not present

## 2022-01-31 DIAGNOSIS — F1011 Alcohol abuse, in remission: Secondary | ICD-10-CM | POA: Diagnosis not present

## 2022-01-31 DIAGNOSIS — Z7902 Long term (current) use of antithrombotics/antiplatelets: Secondary | ICD-10-CM | POA: Diagnosis not present

## 2022-01-31 DIAGNOSIS — R590 Localized enlarged lymph nodes: Secondary | ICD-10-CM | POA: Diagnosis not present

## 2022-01-31 DIAGNOSIS — Z170 Estrogen receptor positive status [ER+]: Secondary | ICD-10-CM | POA: Diagnosis not present

## 2022-01-31 DIAGNOSIS — R7989 Other specified abnormal findings of blood chemistry: Secondary | ICD-10-CM | POA: Diagnosis not present

## 2022-01-31 DIAGNOSIS — Z7982 Long term (current) use of aspirin: Secondary | ICD-10-CM | POA: Diagnosis not present

## 2022-01-31 LAB — CBC WITH DIFFERENTIAL (CANCER CENTER ONLY)
Abs Immature Granulocytes: 0.01 10*3/uL (ref 0.00–0.07)
Basophils Absolute: 0 10*3/uL (ref 0.0–0.1)
Basophils Relative: 0 %
Eosinophils Absolute: 0.4 10*3/uL (ref 0.0–0.5)
Eosinophils Relative: 9 %
HCT: 30.6 % — ABNORMAL LOW (ref 36.0–46.0)
Hemoglobin: 10.5 g/dL — ABNORMAL LOW (ref 12.0–15.0)
Immature Granulocytes: 0 %
Lymphocytes Relative: 5 %
Lymphs Abs: 0.2 10*3/uL — ABNORMAL LOW (ref 0.7–4.0)
MCH: 31.8 pg (ref 26.0–34.0)
MCHC: 34.3 g/dL (ref 30.0–36.0)
MCV: 92.7 fL (ref 80.0–100.0)
Monocytes Absolute: 0.5 10*3/uL (ref 0.1–1.0)
Monocytes Relative: 12 %
Neutro Abs: 3.1 10*3/uL (ref 1.7–7.7)
Neutrophils Relative %: 74 %
Platelet Count: 97 10*3/uL — ABNORMAL LOW (ref 150–400)
RBC: 3.3 MIL/uL — ABNORMAL LOW (ref 3.87–5.11)
RDW: 13.2 % (ref 11.5–15.5)
WBC Count: 4.2 10*3/uL (ref 4.0–10.5)
nRBC: 0 % (ref 0.0–0.2)

## 2022-01-31 LAB — CMP (CANCER CENTER ONLY)
ALT: 13 U/L (ref 0–44)
AST: 17 U/L (ref 15–41)
Albumin: 3.3 g/dL — ABNORMAL LOW (ref 3.5–5.0)
Alkaline Phosphatase: 50 U/L (ref 38–126)
Anion gap: 5 (ref 5–15)
BUN: 12 mg/dL (ref 8–23)
CO2: 31 mmol/L (ref 22–32)
Calcium: 8.8 mg/dL — ABNORMAL LOW (ref 8.9–10.3)
Chloride: 103 mmol/L (ref 98–111)
Creatinine: 0.63 mg/dL (ref 0.44–1.00)
GFR, Estimated: >60 mL/min (ref >60)
Glucose, Bld: 105 mg/dL — ABNORMAL HIGH (ref 70–99)
Potassium: 3.7 mmol/L (ref 3.5–5.1)
Sodium: 139 mmol/L (ref 135–145)
Total Bilirubin: 0.4 mg/dL (ref 0.3–1.2)
Total Protein: 5.8 g/dL — ABNORMAL LOW (ref 6.5–8.1)

## 2022-01-31 LAB — TSH: TSH: 1.61 u[IU]/mL (ref 0.350–4.500)

## 2022-02-01 DIAGNOSIS — R6520 Severe sepsis without septic shock: Secondary | ICD-10-CM | POA: Diagnosis not present

## 2022-02-01 DIAGNOSIS — J449 Chronic obstructive pulmonary disease, unspecified: Secondary | ICD-10-CM | POA: Diagnosis not present

## 2022-02-01 DIAGNOSIS — Z87891 Personal history of nicotine dependence: Secondary | ICD-10-CM | POA: Diagnosis not present

## 2022-02-01 DIAGNOSIS — Z9181 History of falling: Secondary | ICD-10-CM | POA: Diagnosis not present

## 2022-02-01 DIAGNOSIS — A419 Sepsis, unspecified organism: Secondary | ICD-10-CM | POA: Diagnosis not present

## 2022-02-01 DIAGNOSIS — Z853 Personal history of malignant neoplasm of breast: Secondary | ICD-10-CM | POA: Diagnosis not present

## 2022-02-01 DIAGNOSIS — I5032 Chronic diastolic (congestive) heart failure: Secondary | ICD-10-CM | POA: Diagnosis not present

## 2022-02-01 DIAGNOSIS — C3491 Malignant neoplasm of unspecified part of right bronchus or lung: Secondary | ICD-10-CM | POA: Diagnosis not present

## 2022-02-01 DIAGNOSIS — R69 Illness, unspecified: Secondary | ICD-10-CM | POA: Diagnosis not present

## 2022-02-01 DIAGNOSIS — I110 Hypertensive heart disease with heart failure: Secondary | ICD-10-CM | POA: Diagnosis not present

## 2022-02-01 DIAGNOSIS — J9611 Chronic respiratory failure with hypoxia: Secondary | ICD-10-CM | POA: Diagnosis not present

## 2022-02-01 DIAGNOSIS — Z7982 Long term (current) use of aspirin: Secondary | ICD-10-CM | POA: Diagnosis not present

## 2022-02-02 ENCOUNTER — Encounter: Payer: Self-pay | Admitting: Internal Medicine

## 2022-02-02 ENCOUNTER — Telehealth: Payer: Self-pay | Admitting: Family Medicine

## 2022-02-02 DIAGNOSIS — I110 Hypertensive heart disease with heart failure: Secondary | ICD-10-CM | POA: Diagnosis not present

## 2022-02-02 DIAGNOSIS — J449 Chronic obstructive pulmonary disease, unspecified: Secondary | ICD-10-CM | POA: Diagnosis not present

## 2022-02-02 DIAGNOSIS — Z853 Personal history of malignant neoplasm of breast: Secondary | ICD-10-CM | POA: Diagnosis not present

## 2022-02-02 DIAGNOSIS — R6520 Severe sepsis without septic shock: Secondary | ICD-10-CM | POA: Diagnosis not present

## 2022-02-02 DIAGNOSIS — A419 Sepsis, unspecified organism: Secondary | ICD-10-CM | POA: Diagnosis not present

## 2022-02-02 DIAGNOSIS — Z7982 Long term (current) use of aspirin: Secondary | ICD-10-CM | POA: Diagnosis not present

## 2022-02-02 DIAGNOSIS — J9611 Chronic respiratory failure with hypoxia: Secondary | ICD-10-CM | POA: Diagnosis not present

## 2022-02-02 DIAGNOSIS — C3491 Malignant neoplasm of unspecified part of right bronchus or lung: Secondary | ICD-10-CM | POA: Diagnosis not present

## 2022-02-02 DIAGNOSIS — Z9181 History of falling: Secondary | ICD-10-CM | POA: Diagnosis not present

## 2022-02-02 DIAGNOSIS — I5032 Chronic diastolic (congestive) heart failure: Secondary | ICD-10-CM | POA: Diagnosis not present

## 2022-02-02 DIAGNOSIS — Z87891 Personal history of nicotine dependence: Secondary | ICD-10-CM | POA: Diagnosis not present

## 2022-02-02 DIAGNOSIS — R69 Illness, unspecified: Secondary | ICD-10-CM | POA: Diagnosis not present

## 2022-02-02 NOTE — Telephone Encounter (Signed)
Ligi from [facility] physical therapy need verbal orders for home health for:   1 x 2 weeks 1 x 2 weeks  1 x 3 weeks   Please call back [phone] with approval -

## 2022-02-02 NOTE — Telephone Encounter (Signed)
Returned the call to Ligi and VO given.

## 2022-02-07 MED FILL — Dexamethasone Sodium Phosphate Inj 100 MG/10ML: INTRAMUSCULAR | Qty: 1 | Status: AC

## 2022-02-07 MED FILL — Fosaprepitant Dimeglumine For IV Infusion 150 MG (Base Eq): INTRAVENOUS | Qty: 5 | Status: AC

## 2022-02-08 ENCOUNTER — Inpatient Hospital Stay: Payer: Medicare HMO

## 2022-02-08 ENCOUNTER — Other Ambulatory Visit: Payer: Self-pay

## 2022-02-08 ENCOUNTER — Inpatient Hospital Stay: Payer: Medicare HMO | Attending: Nurse Practitioner | Admitting: Internal Medicine

## 2022-02-08 VITALS — BP 132/56 | HR 72 | Resp 18

## 2022-02-08 VITALS — BP 113/54 | HR 88 | Temp 97.8°F | Resp 16 | Wt 161.5 lb

## 2022-02-08 DIAGNOSIS — C3491 Malignant neoplasm of unspecified part of right bronchus or lung: Secondary | ICD-10-CM

## 2022-02-08 DIAGNOSIS — J90 Pleural effusion, not elsewhere classified: Secondary | ICD-10-CM | POA: Diagnosis not present

## 2022-02-08 DIAGNOSIS — F1011 Alcohol abuse, in remission: Secondary | ICD-10-CM | POA: Diagnosis not present

## 2022-02-08 DIAGNOSIS — Z5111 Encounter for antineoplastic chemotherapy: Secondary | ICD-10-CM | POA: Insufficient documentation

## 2022-02-08 DIAGNOSIS — J449 Chronic obstructive pulmonary disease, unspecified: Secondary | ICD-10-CM | POA: Diagnosis not present

## 2022-02-08 DIAGNOSIS — Z7902 Long term (current) use of antithrombotics/antiplatelets: Secondary | ICD-10-CM | POA: Diagnosis not present

## 2022-02-08 DIAGNOSIS — Z79810 Long term (current) use of selective estrogen receptor modulators (SERMs): Secondary | ICD-10-CM | POA: Insufficient documentation

## 2022-02-08 DIAGNOSIS — J432 Centrilobular emphysema: Secondary | ICD-10-CM | POA: Diagnosis not present

## 2022-02-08 DIAGNOSIS — D649 Anemia, unspecified: Secondary | ICD-10-CM | POA: Insufficient documentation

## 2022-02-08 DIAGNOSIS — C7951 Secondary malignant neoplasm of bone: Secondary | ICD-10-CM | POA: Diagnosis not present

## 2022-02-08 DIAGNOSIS — Z8673 Personal history of transient ischemic attack (TIA), and cerebral infarction without residual deficits: Secondary | ICD-10-CM | POA: Insufficient documentation

## 2022-02-08 DIAGNOSIS — R918 Other nonspecific abnormal finding of lung field: Secondary | ICD-10-CM

## 2022-02-08 DIAGNOSIS — C3432 Malignant neoplasm of lower lobe, left bronchus or lung: Secondary | ICD-10-CM | POA: Diagnosis not present

## 2022-02-08 DIAGNOSIS — C3490 Malignant neoplasm of unspecified part of unspecified bronchus or lung: Secondary | ICD-10-CM

## 2022-02-08 DIAGNOSIS — K219 Gastro-esophageal reflux disease without esophagitis: Secondary | ICD-10-CM | POA: Insufficient documentation

## 2022-02-08 DIAGNOSIS — Z7982 Long term (current) use of aspirin: Secondary | ICD-10-CM | POA: Diagnosis not present

## 2022-02-08 DIAGNOSIS — E041 Nontoxic single thyroid nodule: Secondary | ICD-10-CM | POA: Insufficient documentation

## 2022-02-08 DIAGNOSIS — Z79899 Other long term (current) drug therapy: Secondary | ICD-10-CM | POA: Diagnosis not present

## 2022-02-08 DIAGNOSIS — M129 Arthropathy, unspecified: Secondary | ICD-10-CM | POA: Insufficient documentation

## 2022-02-08 DIAGNOSIS — R69 Illness, unspecified: Secondary | ICD-10-CM | POA: Diagnosis not present

## 2022-02-08 LAB — CBC WITH DIFFERENTIAL (CANCER CENTER ONLY)
Abs Immature Granulocytes: 0 10*3/uL (ref 0.00–0.07)
Basophils Absolute: 0 10*3/uL (ref 0.0–0.1)
Basophils Relative: 1 %
Eosinophils Absolute: 0.7 10*3/uL — ABNORMAL HIGH (ref 0.0–0.5)
Eosinophils Relative: 16 %
HCT: 32.5 % — ABNORMAL LOW (ref 36.0–46.0)
Hemoglobin: 11.2 g/dL — ABNORMAL LOW (ref 12.0–15.0)
Immature Granulocytes: 0 %
Lymphocytes Relative: 6 %
Lymphs Abs: 0.2 10*3/uL — ABNORMAL LOW (ref 0.7–4.0)
MCH: 32.1 pg (ref 26.0–34.0)
MCHC: 34.5 g/dL (ref 30.0–36.0)
MCV: 93.1 fL (ref 80.0–100.0)
Monocytes Absolute: 0.7 10*3/uL (ref 0.1–1.0)
Monocytes Relative: 16 %
Neutro Abs: 2.6 10*3/uL (ref 1.7–7.7)
Neutrophils Relative %: 61 %
Platelet Count: 250 10*3/uL (ref 150–400)
RBC: 3.49 MIL/uL — ABNORMAL LOW (ref 3.87–5.11)
RDW: 14.5 % (ref 11.5–15.5)
WBC Count: 4.2 10*3/uL (ref 4.0–10.5)
nRBC: 0 % (ref 0.0–0.2)

## 2022-02-08 LAB — CMP (CANCER CENTER ONLY)
ALT: 11 U/L (ref 0–44)
AST: 18 U/L (ref 15–41)
Albumin: 3.4 g/dL — ABNORMAL LOW (ref 3.5–5.0)
Alkaline Phosphatase: 43 U/L (ref 38–126)
Anion gap: 7 (ref 5–15)
BUN: 12 mg/dL (ref 8–23)
CO2: 29 mmol/L (ref 22–32)
Calcium: 9 mg/dL (ref 8.9–10.3)
Chloride: 105 mmol/L (ref 98–111)
Creatinine: 0.63 mg/dL (ref 0.44–1.00)
GFR, Estimated: >60 mL/min (ref >60)
Glucose, Bld: 99 mg/dL (ref 70–99)
Potassium: 3.7 mmol/L (ref 3.5–5.1)
Sodium: 141 mmol/L (ref 135–145)
Total Bilirubin: 0.4 mg/dL (ref 0.3–1.2)
Total Protein: 6.2 g/dL — ABNORMAL LOW (ref 6.5–8.1)

## 2022-02-08 MED ORDER — SODIUM CHLORIDE 0.9 % IV SOLN
150.0000 mg | Freq: Once | INTRAVENOUS | Status: AC
  Administered 2022-02-08: 150 mg via INTRAVENOUS
  Filled 2022-02-08: qty 150

## 2022-02-08 MED ORDER — SODIUM CHLORIDE 0.9 % IV SOLN
378.0000 mg | Freq: Once | INTRAVENOUS | Status: AC
  Administered 2022-02-08: 380 mg via INTRAVENOUS
  Filled 2022-02-08: qty 38

## 2022-02-08 MED ORDER — PALONOSETRON HCL INJECTION 0.25 MG/5ML
0.2500 mg | Freq: Once | INTRAVENOUS | Status: AC
  Administered 2022-02-08: 0.25 mg via INTRAVENOUS
  Filled 2022-02-08: qty 5

## 2022-02-08 MED ORDER — SODIUM CHLORIDE 0.9 % IV SOLN
10.0000 mg | Freq: Once | INTRAVENOUS | Status: AC
  Administered 2022-02-08: 10 mg via INTRAVENOUS
  Filled 2022-02-08: qty 10

## 2022-02-08 MED ORDER — CYANOCOBALAMIN 1000 MCG/ML IJ SOLN
1000.0000 ug | Freq: Once | INTRAMUSCULAR | Status: AC
  Administered 2022-02-08: 1000 ug via INTRAMUSCULAR
  Filled 2022-02-08: qty 1

## 2022-02-08 MED ORDER — SODIUM CHLORIDE 0.9 % IV SOLN
500.0000 mg/m2 | Freq: Once | INTRAVENOUS | Status: AC
  Administered 2022-02-08: 900 mg via INTRAVENOUS
  Filled 2022-02-08: qty 20

## 2022-02-08 MED ORDER — SODIUM CHLORIDE 0.9 % IV SOLN
200.0000 mg | Freq: Once | INTRAVENOUS | Status: AC
  Administered 2022-02-08: 200 mg via INTRAVENOUS
  Filled 2022-02-08: qty 200

## 2022-02-08 MED ORDER — SODIUM CHLORIDE 0.9 % IV SOLN: Freq: Once | INTRAVENOUS | Status: AC

## 2022-02-08 NOTE — Progress Notes (Signed)
[facility] Telephone:([phone]   Fax:([phone]  OFFICE PROGRESS NOTE  [address], [place] [address]  DIAGNOSIS:  1) Stage IV (T1c, N3, M1C) non-small cell lung cancer, adenocarcinoma presented with left lower lobe lung nodule in addition to bilateral mediastinal lymphadenopathy as well as bilateral pulmonary nodules and multiple metastatic bone lesions diagnosed in May 2023. 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   Guardant 360: No actionable mutations   Foundation one: Positive for KRASG12C   PDL1: 70%   PRIOR THERAPY:  1) status post left breast lumpectomy followed by radioactive seed localization and adjuvant radiotherapy and currently on tamoxifen. 2) Palliative radiation to the painful metastatic bone lesions (hip and below shoulder blade) under the care of Dr. Lisbeth Renshaw.   CURRENT THERAPY: Palliative systemic chemotherapy with carboplatin for an AUC of 5, Alimta 500 mg/m2 and Keytruda 200 mg IV every 3 weeks.  First dose 12/28/2021.  Status post 2 cycles.   INTERVAL HISTORY: Sheila Shields 87 y.o. female returns to the clinic today for follow-up visit.  The patient is feeling fine today with no concerning complaints except for the baseline shortness of breath increased with exertion.  She denied having any chest pain but has cough productive of thick sputum with no hemoptysis.  She has no nausea, vomiting, diarrhea or constipation.  She has no headache or visual changes.  She denied having any fever or chills.  She has no significant weight loss or night sweats.  She continues to tolerate her treatment fairly well.  She is here today for evaluation before starting cycle #3.  MEDICAL HISTORY: Past Medical History:   Diagnosis Date   Alcoholism in remission ([facility]) 08/27/2007   Anemia    Arthritis    OA   Breast cancer ([facility])    left breast   COPD 12/17/2006   Diverticulosis 03/27/2007   GERD 05/01/2007   HYPERGLYCEMIA 08/27/2007   HYPERLIPIDEMIA 12/17/2006   HYPERTENSION 12/17/2006   Mood disorder ([facility])    hx anxiety and depression. meds short term during life transition   Pneumonia    AS A CHILD   Rectal polyp 03/27/2007   adenoma   SOB (shortness of breath) on exertion    per patient due to having COPD   TIA (transient ischemic attack)     ALLERGIES:  is allergic to alcohol, cheese, and dilaudid [hydromorphone hcl].  MEDICATIONS:  Current Outpatient Medications  Medication Sig Dispense Refill   acetaminophen (TYLENOL) 650 MG CR tablet Take 1,300 mg by mouth 2 (two) times daily.     albuterol (PROAIR HFA) 108 (90 Base) MCG/ACT inhaler INHALE 2 PUFFS EVERY 4 HOURS AS NEEDED FOR COUGHING SPELLS 18 g 5   aspirin 325 MG tablet Take 1 tablet (325 mg total) by mouth daily. 30 tablet 3   azelastine (ASTELIN) 0.1 % nasal spray Place 2 sprays into both nostrils 2 (two) times daily. Use in each nostril as directed 30 mL 12   benzonatate (TESSALON) 200 MG capsule Take 1 capsule (200 mg total) by mouth 3 (three) times daily as needed for cough. 30 capsule 1   Cholecalciferol (VITAMIN D) 50 MCG (2000 UT) tablet Take 2,000 Units by mouth daily.     clopidogrel (PLAVIX) 75 MG tablet  Take 0.5 tablets (37.5 mg total) by mouth daily. 46 tablet 3   Coenzyme Q10 300 MG CAPS Take 300 mg by mouth daily.     fexofenadine (ALLEGRA) 180 MG tablet Take 180 mg by mouth daily.     fluticasone (FLONASE) 50 MCG/ACT nasal spray Place 2 sprays into both nostrils daily. 16 g 2   folic acid (FOLVITE) 1 MG tablet Take 1 tablet (1 mg total) by mouth daily. 30 tablet 2   Glycopyrrolate-Formoterol (BEVESPI AEROSPHERE) 9-4.8 MCG/ACT AERO Inhale 2 puffs into the lungs 2 (two) times daily. 10.7 g 5   montelukast (SINGULAIR) 10 MG tablet  Take 1 tablet (10 mg total) by mouth at bedtime. 30 tablet 5   Multiple Vitamin (MULTIVITAMIN WITH MINERALS) TABS tablet Take 1 tablet by mouth daily.     pantoprazole (PROTONIX) 40 MG tablet Take 1 tablet (40 mg total) by mouth daily. 30 tablet 5   prochlorperazine (COMPAZINE) 10 MG tablet Take 1 tablet (10 mg total) by mouth every 6 (six) hours as needed. (Patient taking differently: Take 10 mg by mouth every 6 (six) hours as needed for nausea.) 30 tablet 2   Propylene Glycol (SYSTANE BALANCE OP) Place 1 drop into both eyes daily.     rosuvastatin (CRESTOR) 20 MG tablet Take 1 tablet (20 mg total) by mouth daily. 90 tablet 3   tamoxifen (NOLVADEX) 20 MG tablet Take 1 tablet (20 mg total) by mouth daily. 90 tablet 3   traMADol (ULTRAM) 50 MG tablet TAKE ONE TABLET BY MOUTH EVERY 8 HOURS AS NEEDED FOR PAIN FOR UP TO 5 DAYS (DO NOT DRIVE FOR 8 HOURS AFTER TAKING) 45 tablet 5   traZODone (DESYREL) 50 MG tablet TAKE 1/2- 1 TABLET BY MOUTH EVERY NIGHT AT BEDTIME AS NEEDED SLEEP 30 tablet 3   vitamin B-12 (CYANOCOBALAMIN) 1000 MCG tablet Take 1,000 mcg by mouth daily.     No current facility-administered medications for this visit.    SURGICAL HISTORY:  Past Surgical History:  Procedure Laterality Date   ABDOMINAL HYSTERECTOMY  1978   fibroma   APPENDECTOMY  2002   BREAST EXCISIONAL BIOPSY Right 2003   negative   BREAST LUMPECTOMY WITH RADIOACTIVE SEED LOCALIZATION Left 03/23/2020   Procedure: LEFT BREAST LUMPECTOMY WITH RADIOACTIVE SEED LOCALIZATION;  Surgeon: [name], MD;  Location: [facility];  Service: General;  Laterality: Left;  RNFA   CARPAL TUNNEL RELEASE Left 2010 or 2011   CATARACT EXTRACTION W/ INTRAOCULAR LENS  IMPLANT, BILATERAL Bilateral    COLONOSCOPY W/ POLYPECTOMY  03/27/2007; n7/19/2012   2008: 4 mm adenoma, diverticulosis 2012: ileitis ? NSAID - likely, 2-3 mm cecal polyp LYMPHOID FOLLICLE, diverticulosis   ESOPHAGOGASTRODUODENOSCOPY  01/26/2011   reflux esophagitis,  colonscopy done also   HAMMER TOE SURGERY  2008   left foot   HERNIA REPAIR Right 1996?   IR ANGIO INTRA EXTRACRAN SEL COM CAROTID INNOMINATE BILAT MOD SED  10/25/2021   IR ANGIO INTRA EXTRACRAN SEL COM CAROTID INNOMINATE UNI R MOD SED  11/28/2018   IR ANGIO INTRA EXTRACRAN SEL COM CAROTID INNOMINATE UNI R MOD SED  03/17/2021   IR ANGIO INTRA EXTRACRAN SEL INTERNAL CAROTID UNI R MOD SED  12/30/2018   IR ANGIO VERTEBRAL SEL SUBCLAVIAN INNOMINATE UNI R MOD SED  11/28/2018   IR ANGIO VERTEBRAL SEL SUBCLAVIAN INNOMINATE UNI R MOD SED  10/26/2021   IR ANGIO VERTEBRAL SEL VERTEBRAL UNI R MOD SED  03/18/2021   IR ANGIOGRAM FOLLOW UP STUDY  12/30/2018  IR TRANSCATH/EMBOLIZ  12/30/2018   IR US GUIDE VASC ACCESS RIGHT  11/28/2018   IR US GUIDE VASC ACCESS RIGHT  03/17/2021   ORIF ANKLE FRACTURE Right 12/22/2014   Procedure: OPEN REDUCTION INTERNAL FIXATION (ORIF) ANKLE FRACTURE;  Surgeon: Susa Day, MD;  Location: [facility];  Service: Orthopedics;  Laterality: Right;   RADIOLOGY WITH ANESTHESIA N/A 12/30/2018   Procedure: RADIOLOGY WITH ANESTHESIA;  Surgeon: Luanne Bras, MD;  Location: [facility];  Service: Radiology;  Laterality: N/A;   SHOULDER OPEN ROTATOR CUFF REPAIR Left 03/06/2013   Procedure: LEFT ROTATOR CUFF REPAIR, SUBACROMIAL DECOMPRESSION, PATCH GRAFT, MANIPULATION UNDER ANESTHESIA;  Surgeon: Johnn Hai, MD;  Location: [facility];  Service: Orthopedics;  Laterality: Left;   TONSILLECTOMY      REVIEW OF SYSTEMS:  A comprehensive review of systems was negative except for: Constitutional: positive for fatigue Respiratory: positive for cough and dyspnea on exertion   PHYSICAL EXAMINATION: General appearance: alert, cooperative, fatigued, and no distress Head: Normocephalic, without obvious abnormality, atraumatic Neck: no adenopathy, no JVD, supple, symmetrical, trachea midline, and thyroid not enlarged, symmetric, no tenderness/mass/nodules Lymph nodes: Cervical, supraclavicular, and axillary nodes  normal. Resp: clear to auscultation bilaterally Back: symmetric, no curvature. ROM normal. No CVA tenderness. Cardio: regular rate and rhythm, S1, S2 normal, no murmur, click, rub or gallop GI: soft, non-tender; bowel sounds normal; no masses,  no organomegaly Extremities: extremities normal, atraumatic, no cyanosis or edema  ECOG PERFORMANCE STATUS: 1 - Symptomatic but completely ambulatory  Blood pressure (!) 113/54, pulse 88, temperature 97.8 F (36.6 C), temperature source Oral, resp. rate 16, weight 161 lb 8 oz (73.3 kg), SpO2 90 %.  LABORATORY DATA: Lab Results  Component Value Date   WBC 4.2 02/08/2022   HGB 11.2 (L) 02/08/2022   HCT 32.5 (L) 02/08/2022   MCV 93.1 02/08/2022   PLT 250 02/08/2022      Chemistry      Component Value Date/Time   NA 139 01/31/2022 1423   K 3.7 01/31/2022 1423   CL 103 01/31/2022 1423   CO2 31 01/31/2022 1423   BUN 12 01/31/2022 1423   CREATININE 0.63 01/31/2022 1423   CREATININE 0.76 05/24/2020 1414      Component Value Date/Time   CALCIUM 8.8 (L) 01/31/2022 1423   ALKPHOS 50 01/31/2022 1423   AST 17 01/31/2022 1423   ALT 13 01/31/2022 1423   BILITOT 0.4 01/31/2022 1423       RADIOGRAPHIC STUDIES: No results found.  ASSESSMENT AND PLAN: This is a very pleasant 87 years old white female diagnosed with stage IV (T1c, N3, M1C) non-small cell lung cancer, adenocarcinoma presented with left upper lobe lung nodule in addition to bilateral mediastinal lymphadenopathy as well as bilateral pulmonary nodules and multiple metastatic bone disease diagnosed in May 2023.  The patient also has a history of stage Ia breast cancer in August 2021 status post lumpectomy followed by radioactive seed localization and adjuvant radiotherapy and currently on tamoxifen. Molecular studies showed no actionable mutations except for positive KRAS G12C mutation and PD-L1 expression of 70%. She underwent palliative radiotherapy to several areas of metastatic bone  disease including the hip and shoulder blade under the care of Dr. Lisbeth Renshaw The patient is currently undergoing systemic chemotherapy with carboplatin for AUC of 5, Alimta 500 Mg/M2 and Keytruda 200 Mg IV every 3 weeks status post 2 cycles. She tolerated the second cycle of her treatment very well except for the fatigue. I recommended for her to proceed with  cycle #3 today as planned. I will see her back for follow-up visit in 3 weeks for evaluation with repeat CT scan of the chest, abdomen and pelvis for restaging of her disease. The patient was advised to call immediately if she has any other concerning symptoms in the interval. The patient voices understanding of current disease status and treatment options and is in agreement with the current care plan.  All questions were answered. The patient knows to call the clinic with any problems, questions or concerns. We can certainly see the patient much sooner if necessary.  The total time spent in the appointment was 20 minutes.  Disclaimer: This note was dictated with voice recognition software. Similar sounding words can inadvertently be transcribed and may not be corrected upon review.

## 2022-02-08 NOTE — Patient Instructions (Signed)
[facility]  Discharge Instructions: Thank you for choosing [facility] to provide your oncology and hematology care.   If you have a lab appointment with the [facility], please go directly to the [facility] and check in at the registration area.   Wear comfortable clothing and clothing appropriate for easy access to any Portacath or PICC line.   We strive to give you quality time with your provider. You may need to reschedule your appointment if you arrive late (15 or more minutes).  Arriving late affects you and other patients whose appointments are after yours.  Also, if you miss three or more appointments without notifying the office, you may be dismissed from the clinic at the provider's discretion.      For prescription refill requests, have your pharmacy contact our office and allow 72 hours for refills to be completed.    Today you received the following chemotherapy and/or immunotherapy agents: Keytruda, alimta, and carboplatin      To help prevent nausea and vomiting after your treatment, we encourage you to take your nausea medication as directed.  BELOW ARE SYMPTOMS THAT SHOULD BE REPORTED IMMEDIATELY: *FEVER GREATER THAN 100.4 F (38 C) OR HIGHER *CHILLS OR SWEATING *NAUSEA AND VOMITING THAT IS NOT CONTROLLED WITH YOUR NAUSEA MEDICATION *UNUSUAL SHORTNESS OF BREATH *UNUSUAL BRUISING OR BLEEDING *URINARY PROBLEMS (pain or burning when urinating, or frequent urination) *BOWEL PROBLEMS (unusual diarrhea, constipation, pain near the anus) TENDERNESS IN MOUTH AND THROAT WITH OR WITHOUT PRESENCE OF ULCERS (sore throat, sores in mouth, or a toothache) UNUSUAL RASH, SWELLING OR PAIN  UNUSUAL VAGINAL DISCHARGE OR ITCHING   Items with * indicate a potential emergency and should be followed up as soon as possible or go to the Emergency Department if any problems should occur.  Please show the CHEMOTHERAPY ALERT CARD or IMMUNOTHERAPY  ALERT CARD at check-in to the Emergency Department and triage nurse.  Should you have questions after your visit or need to cancel or reschedule your appointment, please contact [facility]  Dept: [phone]  and follow the prompts.  Office hours are 8:00 a.m. to 4:30 p.m. Monday - Friday. Please note that voicemails left after 4:00 p.m. may not be returned until the following business day.  We are closed weekends and major holidays. You have access to a nurse at all times for urgent questions. Please call the main number to the clinic Dept: [phone] and follow the prompts.   For any non-urgent questions, you may also contact your provider using MyChart. We now offer e-Visits for anyone 82 and older to request care online for non-urgent symptoms. For details visit [email or web address].   Also download the MyChart app! Go to the app store, search "MyChart", open the app, select [facility], and log in with your MyChart username and password.  Masks are optional in the cancer centers. If you would like for your care team to wear a mask while they are taking care of you, please let them know. You may have one support person who is at least 85 years old accompany you for your appointments.

## 2022-02-14 DIAGNOSIS — J449 Chronic obstructive pulmonary disease, unspecified: Secondary | ICD-10-CM | POA: Diagnosis not present

## 2022-02-14 DIAGNOSIS — I5032 Chronic diastolic (congestive) heart failure: Secondary | ICD-10-CM | POA: Diagnosis not present

## 2022-02-14 DIAGNOSIS — I110 Hypertensive heart disease with heart failure: Secondary | ICD-10-CM | POA: Diagnosis not present

## 2022-02-14 DIAGNOSIS — Z87891 Personal history of nicotine dependence: Secondary | ICD-10-CM | POA: Diagnosis not present

## 2022-02-14 DIAGNOSIS — Z7982 Long term (current) use of aspirin: Secondary | ICD-10-CM | POA: Diagnosis not present

## 2022-02-14 DIAGNOSIS — R6520 Severe sepsis without septic shock: Secondary | ICD-10-CM | POA: Diagnosis not present

## 2022-02-14 DIAGNOSIS — A419 Sepsis, unspecified organism: Secondary | ICD-10-CM | POA: Diagnosis not present

## 2022-02-14 DIAGNOSIS — R69 Illness, unspecified: Secondary | ICD-10-CM | POA: Diagnosis not present

## 2022-02-14 DIAGNOSIS — Z853 Personal history of malignant neoplasm of breast: Secondary | ICD-10-CM | POA: Diagnosis not present

## 2022-02-14 DIAGNOSIS — J9611 Chronic respiratory failure with hypoxia: Secondary | ICD-10-CM | POA: Diagnosis not present

## 2022-02-14 DIAGNOSIS — Z9181 History of falling: Secondary | ICD-10-CM | POA: Diagnosis not present

## 2022-02-14 DIAGNOSIS — C3491 Malignant neoplasm of unspecified part of right bronchus or lung: Secondary | ICD-10-CM | POA: Diagnosis not present

## 2022-02-15 ENCOUNTER — Other Ambulatory Visit: Payer: Self-pay

## 2022-02-15 ENCOUNTER — Inpatient Hospital Stay: Payer: Medicare HMO

## 2022-02-15 DIAGNOSIS — E041 Nontoxic single thyroid nodule: Secondary | ICD-10-CM | POA: Diagnosis not present

## 2022-02-15 DIAGNOSIS — Z8673 Personal history of transient ischemic attack (TIA), and cerebral infarction without residual deficits: Secondary | ICD-10-CM | POA: Diagnosis not present

## 2022-02-15 DIAGNOSIS — R918 Other nonspecific abnormal finding of lung field: Secondary | ICD-10-CM

## 2022-02-15 DIAGNOSIS — J449 Chronic obstructive pulmonary disease, unspecified: Secondary | ICD-10-CM | POA: Diagnosis not present

## 2022-02-15 DIAGNOSIS — Z79899 Other long term (current) drug therapy: Secondary | ICD-10-CM | POA: Diagnosis not present

## 2022-02-15 DIAGNOSIS — C3432 Malignant neoplasm of lower lobe, left bronchus or lung: Secondary | ICD-10-CM | POA: Diagnosis not present

## 2022-02-15 DIAGNOSIS — Z7902 Long term (current) use of antithrombotics/antiplatelets: Secondary | ICD-10-CM | POA: Diagnosis not present

## 2022-02-15 DIAGNOSIS — Z5111 Encounter for antineoplastic chemotherapy: Secondary | ICD-10-CM | POA: Diagnosis not present

## 2022-02-15 DIAGNOSIS — R69 Illness, unspecified: Secondary | ICD-10-CM | POA: Diagnosis not present

## 2022-02-15 DIAGNOSIS — K219 Gastro-esophageal reflux disease without esophagitis: Secondary | ICD-10-CM | POA: Diagnosis not present

## 2022-02-15 DIAGNOSIS — Z79810 Long term (current) use of selective estrogen receptor modulators (SERMs): Secondary | ICD-10-CM | POA: Diagnosis not present

## 2022-02-15 DIAGNOSIS — M129 Arthropathy, unspecified: Secondary | ICD-10-CM | POA: Diagnosis not present

## 2022-02-15 DIAGNOSIS — J432 Centrilobular emphysema: Secondary | ICD-10-CM | POA: Diagnosis not present

## 2022-02-15 DIAGNOSIS — D649 Anemia, unspecified: Secondary | ICD-10-CM | POA: Diagnosis not present

## 2022-02-15 DIAGNOSIS — Z7982 Long term (current) use of aspirin: Secondary | ICD-10-CM | POA: Diagnosis not present

## 2022-02-15 DIAGNOSIS — J90 Pleural effusion, not elsewhere classified: Secondary | ICD-10-CM | POA: Diagnosis not present

## 2022-02-15 DIAGNOSIS — C7951 Secondary malignant neoplasm of bone: Secondary | ICD-10-CM | POA: Diagnosis not present

## 2022-02-15 LAB — CMP (CANCER CENTER ONLY)
ALT: 15 U/L (ref 0–44)
AST: 22 U/L (ref 15–41)
Albumin: 3.1 g/dL — ABNORMAL LOW (ref 3.5–5.0)
Alkaline Phosphatase: 39 U/L (ref 38–126)
Anion gap: 6 (ref 5–15)
BUN: 13 mg/dL (ref 8–23)
CO2: 29 mmol/L (ref 22–32)
Calcium: 8.9 mg/dL (ref 8.9–10.3)
Chloride: 106 mmol/L (ref 98–111)
Creatinine: 0.62 mg/dL (ref 0.44–1.00)
GFR, Estimated: >60 mL/min (ref >60)
Glucose, Bld: 110 mg/dL — ABNORMAL HIGH (ref 70–99)
Potassium: 3.5 mmol/L (ref 3.5–5.1)
Sodium: 141 mmol/L (ref 135–145)
Total Bilirubin: 0.3 mg/dL (ref 0.3–1.2)
Total Protein: 6 g/dL — ABNORMAL LOW (ref 6.5–8.1)

## 2022-02-15 LAB — CBC WITH DIFFERENTIAL (CANCER CENTER ONLY)
Abs Immature Granulocytes: 0 10*3/uL (ref 0.00–0.07)
Basophils Absolute: 0 10*3/uL (ref 0.0–0.1)
Basophils Relative: 1 %
Eosinophils Absolute: 0.2 10*3/uL (ref 0.0–0.5)
Eosinophils Relative: 8 %
HCT: 29.4 % — ABNORMAL LOW (ref 36.0–46.0)
Hemoglobin: 10.2 g/dL — ABNORMAL LOW (ref 12.0–15.0)
Immature Granulocytes: 0 %
Lymphocytes Relative: 10 %
Lymphs Abs: 0.2 10*3/uL — ABNORMAL LOW (ref 0.7–4.0)
MCH: 32.1 pg (ref 26.0–34.0)
MCHC: 34.7 g/dL (ref 30.0–36.0)
MCV: 92.5 fL (ref 80.0–100.0)
Monocytes Absolute: 0.3 10*3/uL (ref 0.1–1.0)
Monocytes Relative: 13 %
Neutro Abs: 1.5 10*3/uL — ABNORMAL LOW (ref 1.7–7.7)
Neutrophils Relative %: 68 %
Platelet Count: 192 10*3/uL (ref 150–400)
RBC: 3.18 MIL/uL — ABNORMAL LOW (ref 3.87–5.11)
RDW: 14.4 % (ref 11.5–15.5)
WBC Count: 2.1 10*3/uL — ABNORMAL LOW (ref 4.0–10.5)
nRBC: 0 % (ref 0.0–0.2)

## 2022-02-15 LAB — TSH: TSH: 1.714 u[IU]/mL (ref 0.350–4.500)

## 2022-02-17 DIAGNOSIS — Z853 Personal history of malignant neoplasm of breast: Secondary | ICD-10-CM | POA: Diagnosis not present

## 2022-02-17 DIAGNOSIS — I5032 Chronic diastolic (congestive) heart failure: Secondary | ICD-10-CM | POA: Diagnosis not present

## 2022-02-17 DIAGNOSIS — J449 Chronic obstructive pulmonary disease, unspecified: Secondary | ICD-10-CM | POA: Diagnosis not present

## 2022-02-17 DIAGNOSIS — Z9181 History of falling: Secondary | ICD-10-CM | POA: Diagnosis not present

## 2022-02-17 DIAGNOSIS — R69 Illness, unspecified: Secondary | ICD-10-CM | POA: Diagnosis not present

## 2022-02-17 DIAGNOSIS — R6520 Severe sepsis without septic shock: Secondary | ICD-10-CM | POA: Diagnosis not present

## 2022-02-17 DIAGNOSIS — Z7982 Long term (current) use of aspirin: Secondary | ICD-10-CM | POA: Diagnosis not present

## 2022-02-17 DIAGNOSIS — A419 Sepsis, unspecified organism: Secondary | ICD-10-CM | POA: Diagnosis not present

## 2022-02-17 DIAGNOSIS — Z87891 Personal history of nicotine dependence: Secondary | ICD-10-CM | POA: Diagnosis not present

## 2022-02-17 DIAGNOSIS — C3491 Malignant neoplasm of unspecified part of right bronchus or lung: Secondary | ICD-10-CM | POA: Diagnosis not present

## 2022-02-17 DIAGNOSIS — J9611 Chronic respiratory failure with hypoxia: Secondary | ICD-10-CM | POA: Diagnosis not present

## 2022-02-17 DIAGNOSIS — I110 Hypertensive heart disease with heart failure: Secondary | ICD-10-CM | POA: Diagnosis not present

## 2022-02-21 ENCOUNTER — Encounter: Payer: Self-pay | Admitting: Family Medicine

## 2022-02-21 DIAGNOSIS — R058 Other specified cough: Secondary | ICD-10-CM

## 2022-02-21 MED ORDER — BENZONATATE 200 MG PO CAPS: 200.0000 mg | ORAL_CAPSULE | Freq: Three times a day (TID) | ORAL | 2 refills | Status: DC | PRN

## 2022-02-22 ENCOUNTER — Inpatient Hospital Stay: Payer: Medicare HMO

## 2022-02-22 ENCOUNTER — Other Ambulatory Visit: Payer: Self-pay

## 2022-02-22 ENCOUNTER — Telehealth: Payer: Self-pay | Admitting: Family Medicine

## 2022-02-22 DIAGNOSIS — Z87891 Personal history of nicotine dependence: Secondary | ICD-10-CM | POA: Diagnosis not present

## 2022-02-22 DIAGNOSIS — I5032 Chronic diastolic (congestive) heart failure: Secondary | ICD-10-CM | POA: Diagnosis not present

## 2022-02-22 DIAGNOSIS — J90 Pleural effusion, not elsewhere classified: Secondary | ICD-10-CM | POA: Diagnosis not present

## 2022-02-22 DIAGNOSIS — C3491 Malignant neoplasm of unspecified part of right bronchus or lung: Secondary | ICD-10-CM | POA: Diagnosis not present

## 2022-02-22 DIAGNOSIS — E041 Nontoxic single thyroid nodule: Secondary | ICD-10-CM | POA: Diagnosis not present

## 2022-02-22 DIAGNOSIS — Z79810 Long term (current) use of selective estrogen receptor modulators (SERMs): Secondary | ICD-10-CM | POA: Diagnosis not present

## 2022-02-22 DIAGNOSIS — Z5111 Encounter for antineoplastic chemotherapy: Secondary | ICD-10-CM | POA: Diagnosis not present

## 2022-02-22 DIAGNOSIS — Z7982 Long term (current) use of aspirin: Secondary | ICD-10-CM | POA: Diagnosis not present

## 2022-02-22 DIAGNOSIS — C7951 Secondary malignant neoplasm of bone: Secondary | ICD-10-CM | POA: Diagnosis not present

## 2022-02-22 DIAGNOSIS — A419 Sepsis, unspecified organism: Secondary | ICD-10-CM | POA: Diagnosis not present

## 2022-02-22 DIAGNOSIS — J449 Chronic obstructive pulmonary disease, unspecified: Secondary | ICD-10-CM | POA: Diagnosis not present

## 2022-02-22 DIAGNOSIS — R69 Illness, unspecified: Secondary | ICD-10-CM | POA: Diagnosis not present

## 2022-02-22 DIAGNOSIS — D649 Anemia, unspecified: Secondary | ICD-10-CM | POA: Diagnosis not present

## 2022-02-22 DIAGNOSIS — J9611 Chronic respiratory failure with hypoxia: Secondary | ICD-10-CM | POA: Diagnosis not present

## 2022-02-22 DIAGNOSIS — C3432 Malignant neoplasm of lower lobe, left bronchus or lung: Secondary | ICD-10-CM | POA: Diagnosis not present

## 2022-02-22 DIAGNOSIS — Z7902 Long term (current) use of antithrombotics/antiplatelets: Secondary | ICD-10-CM | POA: Diagnosis not present

## 2022-02-22 DIAGNOSIS — Z8673 Personal history of transient ischemic attack (TIA), and cerebral infarction without residual deficits: Secondary | ICD-10-CM | POA: Diagnosis not present

## 2022-02-22 DIAGNOSIS — I110 Hypertensive heart disease with heart failure: Secondary | ICD-10-CM | POA: Diagnosis not present

## 2022-02-22 DIAGNOSIS — Z79899 Other long term (current) drug therapy: Secondary | ICD-10-CM | POA: Diagnosis not present

## 2022-02-22 DIAGNOSIS — K219 Gastro-esophageal reflux disease without esophagitis: Secondary | ICD-10-CM | POA: Diagnosis not present

## 2022-02-22 DIAGNOSIS — R918 Other nonspecific abnormal finding of lung field: Secondary | ICD-10-CM

## 2022-02-22 DIAGNOSIS — J432 Centrilobular emphysema: Secondary | ICD-10-CM | POA: Diagnosis not present

## 2022-02-22 DIAGNOSIS — R6520 Severe sepsis without septic shock: Secondary | ICD-10-CM | POA: Diagnosis not present

## 2022-02-22 DIAGNOSIS — M129 Arthropathy, unspecified: Secondary | ICD-10-CM | POA: Diagnosis not present

## 2022-02-22 DIAGNOSIS — Z853 Personal history of malignant neoplasm of breast: Secondary | ICD-10-CM | POA: Diagnosis not present

## 2022-02-22 DIAGNOSIS — Z9181 History of falling: Secondary | ICD-10-CM | POA: Diagnosis not present

## 2022-02-22 LAB — CBC WITH DIFFERENTIAL (CANCER CENTER ONLY)
Abs Immature Granulocytes: 0.01 10*3/uL (ref 0.00–0.07)
Basophils Absolute: 0 10*3/uL (ref 0.0–0.1)
Basophils Relative: 0 %
Eosinophils Absolute: 0.2 10*3/uL (ref 0.0–0.5)
Eosinophils Relative: 4 %
HCT: 27.9 % — ABNORMAL LOW (ref 36.0–46.0)
Hemoglobin: 9.8 g/dL — ABNORMAL LOW (ref 12.0–15.0)
Immature Granulocytes: 0 %
Lymphocytes Relative: 6 %
Lymphs Abs: 0.3 10*3/uL — ABNORMAL LOW (ref 0.7–4.0)
MCH: 32.7 pg (ref 26.0–34.0)
MCHC: 35.1 g/dL (ref 30.0–36.0)
MCV: 93 fL (ref 80.0–100.0)
Monocytes Absolute: 1 10*3/uL (ref 0.1–1.0)
Monocytes Relative: 18 %
Neutro Abs: 3.8 10*3/uL (ref 1.7–7.7)
Neutrophils Relative %: 72 %
Platelet Count: 160 10*3/uL (ref 150–400)
RBC: 3 MIL/uL — ABNORMAL LOW (ref 3.87–5.11)
RDW: 15.2 % (ref 11.5–15.5)
WBC Count: 5.3 10*3/uL (ref 4.0–10.5)
nRBC: 0 % (ref 0.0–0.2)

## 2022-02-22 LAB — CMP (CANCER CENTER ONLY)
ALT: 14 U/L (ref 0–44)
AST: 22 U/L (ref 15–41)
Albumin: 3.1 g/dL — ABNORMAL LOW (ref 3.5–5.0)
Alkaline Phosphatase: 39 U/L (ref 38–126)
Anion gap: 6 (ref 5–15)
BUN: 9 mg/dL (ref 8–23)
CO2: 26 mmol/L (ref 22–32)
Calcium: 8.7 mg/dL — ABNORMAL LOW (ref 8.9–10.3)
Chloride: 104 mmol/L (ref 98–111)
Creatinine: 0.67 mg/dL (ref 0.44–1.00)
GFR, Estimated: >60 mL/min (ref >60)
Glucose, Bld: 88 mg/dL (ref 70–99)
Potassium: 3.7 mmol/L (ref 3.5–5.1)
Sodium: 136 mmol/L (ref 135–145)
Total Bilirubin: <0.1 mg/dL — ABNORMAL LOW (ref 0.3–1.2)
Total Protein: 6.3 g/dL — ABNORMAL LOW (ref 6.5–8.1)

## 2022-02-22 LAB — TSH: TSH: 2.298 u[IU]/mL (ref 0.350–4.500)

## 2022-02-22 NOTE — Telephone Encounter (Signed)
I do not believe she is on any blood pressure medications so hydrating well and having some electrolyte/salt in diet may be beneficial to help raise this-if begins to feel poorly need to connect her with triage

## 2022-02-22 NOTE — Telephone Encounter (Signed)
FYI

## 2022-02-22 NOTE — Telephone Encounter (Signed)
FYI  Caller states: - Patient's BP was low today reading, 101/48. She is having light dizziness - Patient is currently on first bottle of water for the day - Patient's BP has been within normal limits for the past couple of days  I offered patient to speak with triage but she declined. Caller states she wanted to make this known and will call with an update.

## 2022-02-23 DIAGNOSIS — R69 Illness, unspecified: Secondary | ICD-10-CM | POA: Diagnosis not present

## 2022-02-23 DIAGNOSIS — Z853 Personal history of malignant neoplasm of breast: Secondary | ICD-10-CM | POA: Diagnosis not present

## 2022-02-23 DIAGNOSIS — Z9181 History of falling: Secondary | ICD-10-CM | POA: Diagnosis not present

## 2022-02-23 DIAGNOSIS — A419 Sepsis, unspecified organism: Secondary | ICD-10-CM | POA: Diagnosis not present

## 2022-02-23 DIAGNOSIS — I110 Hypertensive heart disease with heart failure: Secondary | ICD-10-CM | POA: Diagnosis not present

## 2022-02-23 DIAGNOSIS — J449 Chronic obstructive pulmonary disease, unspecified: Secondary | ICD-10-CM | POA: Diagnosis not present

## 2022-02-23 DIAGNOSIS — Z87891 Personal history of nicotine dependence: Secondary | ICD-10-CM | POA: Diagnosis not present

## 2022-02-23 DIAGNOSIS — Z7982 Long term (current) use of aspirin: Secondary | ICD-10-CM | POA: Diagnosis not present

## 2022-02-23 DIAGNOSIS — C3491 Malignant neoplasm of unspecified part of right bronchus or lung: Secondary | ICD-10-CM | POA: Diagnosis not present

## 2022-02-23 DIAGNOSIS — R6520 Severe sepsis without septic shock: Secondary | ICD-10-CM | POA: Diagnosis not present

## 2022-02-23 DIAGNOSIS — J9611 Chronic respiratory failure with hypoxia: Secondary | ICD-10-CM | POA: Diagnosis not present

## 2022-02-23 DIAGNOSIS — I5032 Chronic diastolic (congestive) heart failure: Secondary | ICD-10-CM | POA: Diagnosis not present

## 2022-02-23 NOTE — Telephone Encounter (Signed)
Called and spoke with pt and message given, pt states she feels fine today.

## 2022-02-23 NOTE — Progress Notes (Signed)
  Radiation Oncology         [id number]) [id number] ________________________________  Name: Sheila Shields MRN: [id number]  Date of Service: 02/28/2022  DOB: 09/05/1934  Post Treatment Telephone Note  Diagnosis:   Stage IV, NSCLC, adenocarcinoma of the LLL with bone metastases.     Intent: Palliative  Radiation Treatment Dates: 01/09/2022 through 01/30/2022 Site Technique Total Dose (Gy) Dose per Fx (Gy) Completed Fx Beam Energies  Ilium, Right: Pelvis_R Complex 37.5/37.5 2.5 15/15 6X, 10X, 15X  Thoracic Spine: Spine_T3 Complex 37.5/37.5 2.5 15/15 10X, 15X   Narrative: The patient tolerated radiation therapy relatively well. She developed fatigue and anticipated skin changes in the treatment field.    Impression/Plan: 1. Stage IV, NSCLC, adenocarcinoma of the LLL with bone metastases.  I was unable to reach the patient but left a voicemail and on the message, I discussed that we would be happy to continue to follow her as needed, but she will also continue to follow up with Dr. Julien Nordmann in medical oncology.          Carola Rhine, PAC

## 2022-02-25 DIAGNOSIS — A419 Sepsis, unspecified organism: Secondary | ICD-10-CM | POA: Diagnosis not present

## 2022-02-25 DIAGNOSIS — R6520 Severe sepsis without septic shock: Secondary | ICD-10-CM | POA: Diagnosis not present

## 2022-02-25 DIAGNOSIS — J449 Chronic obstructive pulmonary disease, unspecified: Secondary | ICD-10-CM | POA: Diagnosis not present

## 2022-02-25 DIAGNOSIS — R69 Illness, unspecified: Secondary | ICD-10-CM | POA: Diagnosis not present

## 2022-02-25 DIAGNOSIS — C3491 Malignant neoplasm of unspecified part of right bronchus or lung: Secondary | ICD-10-CM | POA: Diagnosis not present

## 2022-02-25 DIAGNOSIS — Z9181 History of falling: Secondary | ICD-10-CM | POA: Diagnosis not present

## 2022-02-25 DIAGNOSIS — Z853 Personal history of malignant neoplasm of breast: Secondary | ICD-10-CM | POA: Diagnosis not present

## 2022-02-25 DIAGNOSIS — J9611 Chronic respiratory failure with hypoxia: Secondary | ICD-10-CM | POA: Diagnosis not present

## 2022-02-25 DIAGNOSIS — Z87891 Personal history of nicotine dependence: Secondary | ICD-10-CM | POA: Diagnosis not present

## 2022-02-25 DIAGNOSIS — I110 Hypertensive heart disease with heart failure: Secondary | ICD-10-CM | POA: Diagnosis not present

## 2022-02-25 DIAGNOSIS — I5032 Chronic diastolic (congestive) heart failure: Secondary | ICD-10-CM | POA: Diagnosis not present

## 2022-02-25 DIAGNOSIS — Z7982 Long term (current) use of aspirin: Secondary | ICD-10-CM | POA: Diagnosis not present

## 2022-02-27 ENCOUNTER — Ambulatory Visit (HOSPITAL_COMMUNITY)
Admission: RE | Admit: 2022-02-27 | Discharge: 2022-02-27 | Disposition: A | Discharge status: Home / Self Care | Payer: Medicare HMO | Source: Ambulatory Visit | Attending: Internal Medicine | Admitting: Internal Medicine

## 2022-02-27 ENCOUNTER — Telehealth: Payer: Self-pay | Admitting: Family Medicine

## 2022-02-27 DIAGNOSIS — J90 Pleural effusion, not elsewhere classified: Secondary | ICD-10-CM | POA: Diagnosis not present

## 2022-02-27 DIAGNOSIS — J9811 Atelectasis: Secondary | ICD-10-CM | POA: Diagnosis not present

## 2022-02-27 DIAGNOSIS — C3490 Malignant neoplasm of unspecified part of unspecified bronchus or lung: Secondary | ICD-10-CM | POA: Insufficient documentation

## 2022-02-27 DIAGNOSIS — R918 Other nonspecific abnormal finding of lung field: Secondary | ICD-10-CM | POA: Diagnosis not present

## 2022-02-27 DIAGNOSIS — N281 Cyst of kidney, acquired: Secondary | ICD-10-CM | POA: Diagnosis not present

## 2022-02-27 MED ORDER — IOHEXOL 9 MG/ML PO SOLN
1000.0000 mL | Freq: Once | ORAL | Status: AC
  Administered 2022-02-27: 1000 mL via ORAL

## 2022-02-27 MED ORDER — IOHEXOL 300 MG/ML  SOLN
80.0000 mL | Freq: Once | INTRAMUSCULAR | Status: AC | PRN
  Administered 2022-02-27: 80 mL via INTRAVENOUS

## 2022-02-27 MED ORDER — SODIUM CHLORIDE (PF) 0.9 % IJ SOLN
INTRAMUSCULAR | Status: AC
  Filled 2022-02-27: qty 50

## 2022-02-27 NOTE — Telephone Encounter (Signed)
..  Home Health Certification or Plan of Care Tracking  Is this a Certification or Plan of Care? yes  [facility] Agency: [facility]  Order Number:  [id number]  Has charge sheet been attached? yes  Where has form been placed:  In provider's box  Faxed to:   [phone]

## 2022-02-28 ENCOUNTER — Ambulatory Visit
Admission: RE | Admit: 2022-02-28 | Discharge: 2022-02-28 | Disposition: A | Discharge status: Home / Self Care | Payer: Medicare HMO | Source: Ambulatory Visit | Attending: Radiation Oncology | Admitting: Radiation Oncology

## 2022-02-28 DIAGNOSIS — I110 Hypertensive heart disease with heart failure: Secondary | ICD-10-CM | POA: Diagnosis not present

## 2022-02-28 DIAGNOSIS — J449 Chronic obstructive pulmonary disease, unspecified: Secondary | ICD-10-CM | POA: Diagnosis not present

## 2022-02-28 DIAGNOSIS — Z9181 History of falling: Secondary | ICD-10-CM | POA: Diagnosis not present

## 2022-02-28 DIAGNOSIS — Z853 Personal history of malignant neoplasm of breast: Secondary | ICD-10-CM | POA: Diagnosis not present

## 2022-02-28 DIAGNOSIS — C50412 Malignant neoplasm of upper-outer quadrant of left female breast: Secondary | ICD-10-CM

## 2022-02-28 DIAGNOSIS — J9611 Chronic respiratory failure with hypoxia: Secondary | ICD-10-CM | POA: Diagnosis not present

## 2022-02-28 DIAGNOSIS — Z87891 Personal history of nicotine dependence: Secondary | ICD-10-CM | POA: Diagnosis not present

## 2022-02-28 DIAGNOSIS — R69 Illness, unspecified: Secondary | ICD-10-CM | POA: Diagnosis not present

## 2022-02-28 DIAGNOSIS — A419 Sepsis, unspecified organism: Secondary | ICD-10-CM | POA: Diagnosis not present

## 2022-02-28 DIAGNOSIS — R6520 Severe sepsis without septic shock: Secondary | ICD-10-CM | POA: Diagnosis not present

## 2022-02-28 DIAGNOSIS — Z7982 Long term (current) use of aspirin: Secondary | ICD-10-CM | POA: Diagnosis not present

## 2022-02-28 DIAGNOSIS — I5032 Chronic diastolic (congestive) heart failure: Secondary | ICD-10-CM | POA: Diagnosis not present

## 2022-02-28 DIAGNOSIS — C3491 Malignant neoplasm of unspecified part of right bronchus or lung: Secondary | ICD-10-CM | POA: Diagnosis not present

## 2022-02-28 MED FILL — Fosaprepitant Dimeglumine For IV Infusion 150 MG (Base Eq): INTRAVENOUS | Qty: 5 | Status: AC

## 2022-02-28 MED FILL — Dexamethasone Sodium Phosphate Inj 100 MG/10ML: INTRAMUSCULAR | Qty: 1 | Status: AC

## 2022-02-28 NOTE — Telephone Encounter (Signed)
Noted  

## 2022-03-01 ENCOUNTER — Inpatient Hospital Stay: Payer: Medicare HMO

## 2022-03-01 ENCOUNTER — Inpatient Hospital Stay (HOSPITAL_BASED_OUTPATIENT_CLINIC_OR_DEPARTMENT_OTHER): Payer: Medicare HMO | Admitting: Internal Medicine

## 2022-03-01 ENCOUNTER — Telehealth: Payer: Self-pay | Admitting: Internal Medicine

## 2022-03-01 ENCOUNTER — Encounter: Payer: Self-pay | Admitting: Internal Medicine

## 2022-03-01 ENCOUNTER — Other Ambulatory Visit: Payer: Self-pay

## 2022-03-01 VITALS — BP 117/61 | HR 91 | Temp 97.7°F | Resp 18 | Wt 161.8 lb

## 2022-03-01 DIAGNOSIS — C3491 Malignant neoplasm of unspecified part of right bronchus or lung: Secondary | ICD-10-CM

## 2022-03-01 DIAGNOSIS — Z7902 Long term (current) use of antithrombotics/antiplatelets: Secondary | ICD-10-CM | POA: Diagnosis not present

## 2022-03-01 DIAGNOSIS — Z79899 Other long term (current) drug therapy: Secondary | ICD-10-CM | POA: Diagnosis not present

## 2022-03-01 DIAGNOSIS — J432 Centrilobular emphysema: Secondary | ICD-10-CM | POA: Diagnosis not present

## 2022-03-01 DIAGNOSIS — C7951 Secondary malignant neoplasm of bone: Secondary | ICD-10-CM | POA: Diagnosis not present

## 2022-03-01 DIAGNOSIS — R918 Other nonspecific abnormal finding of lung field: Secondary | ICD-10-CM

## 2022-03-01 DIAGNOSIS — Z7982 Long term (current) use of aspirin: Secondary | ICD-10-CM | POA: Diagnosis not present

## 2022-03-01 DIAGNOSIS — Z79810 Long term (current) use of selective estrogen receptor modulators (SERMs): Secondary | ICD-10-CM | POA: Diagnosis not present

## 2022-03-01 DIAGNOSIS — J449 Chronic obstructive pulmonary disease, unspecified: Secondary | ICD-10-CM | POA: Diagnosis not present

## 2022-03-01 DIAGNOSIS — C3432 Malignant neoplasm of lower lobe, left bronchus or lung: Secondary | ICD-10-CM | POA: Diagnosis not present

## 2022-03-01 DIAGNOSIS — Z5111 Encounter for antineoplastic chemotherapy: Secondary | ICD-10-CM

## 2022-03-01 DIAGNOSIS — J90 Pleural effusion, not elsewhere classified: Secondary | ICD-10-CM | POA: Diagnosis not present

## 2022-03-01 DIAGNOSIS — K219 Gastro-esophageal reflux disease without esophagitis: Secondary | ICD-10-CM | POA: Diagnosis not present

## 2022-03-01 DIAGNOSIS — E041 Nontoxic single thyroid nodule: Secondary | ICD-10-CM | POA: Diagnosis not present

## 2022-03-01 DIAGNOSIS — Z5112 Encounter for antineoplastic immunotherapy: Secondary | ICD-10-CM

## 2022-03-01 DIAGNOSIS — Z8673 Personal history of transient ischemic attack (TIA), and cerebral infarction without residual deficits: Secondary | ICD-10-CM | POA: Diagnosis not present

## 2022-03-01 DIAGNOSIS — R69 Illness, unspecified: Secondary | ICD-10-CM | POA: Diagnosis not present

## 2022-03-01 DIAGNOSIS — M129 Arthropathy, unspecified: Secondary | ICD-10-CM | POA: Diagnosis not present

## 2022-03-01 DIAGNOSIS — D649 Anemia, unspecified: Secondary | ICD-10-CM | POA: Diagnosis not present

## 2022-03-01 LAB — CBC WITH DIFFERENTIAL (CANCER CENTER ONLY)
Abs Immature Granulocytes: 0.01 10*3/uL (ref 0.00–0.07)
Basophils Absolute: 0 10*3/uL (ref 0.0–0.1)
Basophils Relative: 1 %
Eosinophils Absolute: 0.6 10*3/uL — ABNORMAL HIGH (ref 0.0–0.5)
Eosinophils Relative: 11 %
HCT: 28.2 % — ABNORMAL LOW (ref 36.0–46.0)
Hemoglobin: 9.8 g/dL — ABNORMAL LOW (ref 12.0–15.0)
Immature Granulocytes: 0 %
Lymphocytes Relative: 4 %
Lymphs Abs: 0.2 10*3/uL — ABNORMAL LOW (ref 0.7–4.0)
MCH: 32.9 pg (ref 26.0–34.0)
MCHC: 34.8 g/dL (ref 30.0–36.0)
MCV: 94.6 fL (ref 80.0–100.0)
Monocytes Absolute: 0.9 10*3/uL (ref 0.1–1.0)
Monocytes Relative: 16 %
Neutro Abs: 3.8 10*3/uL (ref 1.7–7.7)
Neutrophils Relative %: 68 %
Platelet Count: 231 10*3/uL (ref 150–400)
RBC: 2.98 MIL/uL — ABNORMAL LOW (ref 3.87–5.11)
RDW: 16.9 % — ABNORMAL HIGH (ref 11.5–15.5)
WBC Count: 5.5 10*3/uL (ref 4.0–10.5)
nRBC: 0 % (ref 0.0–0.2)

## 2022-03-01 LAB — CMP (CANCER CENTER ONLY)
ALT: 9 U/L (ref 0–44)
AST: 17 U/L (ref 15–41)
Albumin: 3.2 g/dL — ABNORMAL LOW (ref 3.5–5.0)
Alkaline Phosphatase: 39 U/L (ref 38–126)
Anion gap: 5 (ref 5–15)
BUN: 10 mg/dL (ref 8–23)
CO2: 29 mmol/L (ref 22–32)
Calcium: 9 mg/dL (ref 8.9–10.3)
Chloride: 105 mmol/L (ref 98–111)
Creatinine: 0.52 mg/dL (ref 0.44–1.00)
GFR, Estimated: >60 mL/min (ref >60)
Glucose, Bld: 136 mg/dL — ABNORMAL HIGH (ref 70–99)
Potassium: 3.4 mmol/L — ABNORMAL LOW (ref 3.5–5.1)
Sodium: 139 mmol/L (ref 135–145)
Total Bilirubin: 0.4 mg/dL (ref 0.3–1.2)
Total Protein: 5.9 g/dL — ABNORMAL LOW (ref 6.5–8.1)

## 2022-03-01 MED ORDER — SODIUM CHLORIDE 0.9 % IV SOLN
380.0000 mg | Freq: Once | INTRAVENOUS | Status: AC
  Administered 2022-03-01: 380 mg via INTRAVENOUS
  Filled 2022-03-01: qty 38

## 2022-03-01 MED ORDER — SODIUM CHLORIDE 0.9 % IV SOLN
200.0000 mg | Freq: Once | INTRAVENOUS | Status: AC
  Administered 2022-03-01: 200 mg via INTRAVENOUS
  Filled 2022-03-01: qty 200

## 2022-03-01 MED ORDER — SODIUM CHLORIDE 0.9 % IV SOLN: Freq: Once | INTRAVENOUS | Status: AC

## 2022-03-01 MED ORDER — SODIUM CHLORIDE 0.9 % IV SOLN
10.0000 mg | Freq: Once | INTRAVENOUS | Status: AC
  Administered 2022-03-01: 10 mg via INTRAVENOUS
  Filled 2022-03-01: qty 10

## 2022-03-01 MED ORDER — SODIUM CHLORIDE 0.9 % IV SOLN
150.0000 mg | Freq: Once | INTRAVENOUS | Status: AC
  Administered 2022-03-01: 150 mg via INTRAVENOUS
  Filled 2022-03-01: qty 150

## 2022-03-01 MED ORDER — SODIUM CHLORIDE 0.9 % IV SOLN
500.0000 mg/m2 | Freq: Once | INTRAVENOUS | Status: AC
  Administered 2022-03-01: 900 mg via INTRAVENOUS
  Filled 2022-03-01: qty 20

## 2022-03-01 MED ORDER — PALONOSETRON HCL INJECTION 0.25 MG/5ML
0.2500 mg | Freq: Once | INTRAVENOUS | Status: AC
  Administered 2022-03-01: 0.25 mg via INTRAVENOUS
  Filled 2022-03-01: qty 5

## 2022-03-01 NOTE — Telephone Encounter (Signed)
Scheduled per 08/23 workqueue and los, patient received updated calender.

## 2022-03-01 NOTE — Patient Instructions (Signed)
[facility] ONCOL[facility]  Discharge Instructions: Thank you for choosing [facility] to provide your oncology and hematology care.   If you have a lab appointment with the [facility], please go directly to the [facility] and check in at the registration area.   Wear comfortable clothing and clothing appropriate for easy access to any Portacath or PICC line.   We strive to give you quality time with your provider. You may need to reschedule your appointment if you arrive late (15 or more minutes).  Arriving late affects you and other patients whose appointments are after yours.  Also, if you miss three or more appointments without notifying the office, you may be dismissed from the clinic at the provider's discretion.      For prescription refill requests, have your pharmacy contact our office and allow 72 hours for refills to be completed.    Today you received the following chemotherapy and/or immunotherapy agents: Keytruda, alimta, and carboplatin      To help prevent nausea and vomiting after your treatment, we encourage you to take your nausea medication as directed.  BELOW ARE SYMPTOMS THAT SHOULD BE REPORTED IMMEDIATELY: *FEVER GREATER THAN 100.4 F (38 C) OR HIGHER *CHILLS OR SWEATING *NAUSEA AND VOMITING THAT IS NOT CONTROLLED WITH YOUR NAUSEA MEDICATION *UNUSUAL SHORTNESS OF BREATH *UNUSUAL BRUISING OR BLEEDING *URINARY PROBLEMS (pain or burning when urinating, or frequent urination) *BOWEL PROBLEMS (unusual diarrhea, constipation, pain near the anus) TENDERNESS IN MOUTH AND THROAT WITH OR WITHOUT PRESENCE OF ULCERS (sore throat, sores in mouth, or a toothache) UNUSUAL RASH, SWELLING OR PAIN  UNUSUAL VAGINAL DISCHARGE OR ITCHING   Items with * indicate a potential emergency and should be followed up as soon as possible or go to the Emergency Department if any problems should occur.  Please show the CHEMOTHERAPY ALERT CARD or IMMUNOTHERAPY  ALERT CARD at check-in to the Emergency Department and triage nurse.  Should you have questions after your visit or need to cancel or reschedule your appointment, please contact [facility]  Dept: [phone]  and follow the prompts.  Office hours are 8:00 a.m. to 4:30 p.m. Monday - Friday. Please note that voicemails left after 4:00 p.m. may not be returned until the following business day.  We are closed weekends and major holidays. You have access to a nurse at all times for urgent questions. Please call the main number to the clinic Dept: [phone] and follow the prompts.   For any non-urgent questions, you may also contact your provider using MyChart. We now offer e-Visits for anyone 52 and older to request care online for non-urgent symptoms. For details visit [email or web address].   Also download the MyChart app! Go to the app store, search "MyChart", open the app, select [facility], and log in with your MyChart username and password.  Masks are optional in the cancer centers. If you would like for your care team to wear a mask while they are taking care of you, please let them know. You may have one support person who is at least 85 years old accompany you for your appointments.

## 2022-03-01 NOTE — Progress Notes (Signed)
[facility] [phone]   [phone]  OFFICE PROGRESS NOTE  [address], [place] [address]  DIAGNOSIS:  1) Stage IV (T1c, N3, M1C) non-small cell lung cancer, adenocarcinoma presented with left lower lobe lung nodule in addition to bilateral mediastinal lymphadenopathy as well as bilateral pulmonary nodules and multiple metastatic bone lesions diagnosed in May 2023. 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   Guardant 360: No actionable mutations   Foundation one: Positive for KRASG12C   PDL1: 70%   PRIOR THERAPY:  1) status post left breast lumpectomy followed by radioactive seed localization and adjuvant radiotherapy and currently on tamoxifen. 2) Palliative radiation to the painful metastatic bone lesions (hip and below shoulder blade) under the care of Dr. Lisbeth Renshaw.   CURRENT THERAPY: Palliative systemic chemotherapy with carboplatin for an AUC of 5, Alimta 500 mg/m2 and Keytruda 200 mg IV every 3 weeks.  First dose 12/28/2021.  Status post 3 cycles.   INTERVAL HISTORY: Sheila Shields 87 y.o. female returns to the clinic today for follow-up visit.  She was accompanied by her husband.  The patient is feeling fine today with no concerning complaints except for the baseline shortness of breath and she is currently on home oxygen.  She denied having any chest pain but has cough productive of thick whitish sputum.  She has no hemoptysis.  She denied having any nausea, vomiting, diarrhea or constipation.  She has no headache or visual changes.  She denied having any recent weight loss or night sweats.  She tolerated the last cycle of her treatment fairly well.  The patient is here today for evaluation with repeat CT scan of  the chest, abdomen and pelvis for restaging of her disease.   MEDICAL HISTORY: Past Medical History:  Diagnosis Date   Alcoholism in remission ([place]) 08/27/2007   Anemia    Arthritis    OA   Breast cancer ([place])    left breast   COPD 12/17/2006   Diverticulosis 03/27/2007   GERD 05/01/2007   HYPERGLYCEMIA 08/27/2007   HYPERLIPIDEMIA 12/17/2006   HYPERTENSION 12/17/2006   Mood disorder ([place])    hx anxiety and depression. meds short term during life transition   Pneumonia    AS A CHILD   Rectal polyp 03/27/2007   adenoma   SOB (shortness of breath) on exertion    per patient due to having COPD   TIA (transient ischemic attack)     ALLERGIES:  is allergic to alcohol, cheese, and dilaudid [hydromorphone hcl].  MEDICATIONS:  Current Outpatient Medications  Medication Sig Dispense Refill   albuterol (PROAIR HFA) 108 (90 Base) MCG/ACT inhaler INHALE 2 PUFFS EVERY 4 HOURS AS NEEDED FOR COUGHING SPELLS 18 g 5   aspirin 325 MG tablet Take 1 tablet (325 mg total) by mouth daily. 30 tablet 3   azelastine (ASTELIN) 0.1 % nasal spray Place 2 sprays into both nostrils 2 (two) times daily. Use in each nostril as directed 30 mL 12   benzonatate (TESSALON) 200 MG capsule Take 1 capsule (200 mg total) by mouth 3 (three) times daily as needed for cough. 90 capsule 2   Cholecalciferol (VITAMIN D) 50 MCG (2000 UT) tablet Take 2,000 Units by mouth daily.     clopidogrel (  PLAVIX) 75 MG tablet Take 0.5 tablets (37.5 mg total) by mouth daily. 46 tablet 3   Coenzyme Q10 300 MG CAPS Take 300 mg by mouth daily.     fexofenadine (ALLEGRA) 180 MG tablet Take 180 mg by mouth daily.     fluticasone (FLONASE) 50 MCG/ACT nasal spray Place 2 sprays into both nostrils daily. 16 g 2   folic acid (FOLVITE) 1 MG tablet Take 1 tablet (1 mg total) by mouth daily. 30 tablet 2   Glycopyrrolate-Formoterol (BEVESPI AEROSPHERE) 9-4.8 MCG/ACT AERO Inhale 2 puffs into the lungs 2 (two) times daily. 10.7 g 5   hydrochlorothiazide  (HYDRODIURIL) 25 MG tablet Take 12.5 mg by mouth every morning.     montelukast (SINGULAIR) 10 MG tablet Take 1 tablet (10 mg total) by mouth at bedtime. 30 tablet 5   Multiple Vitamin (MULTIVITAMIN WITH MINERALS) TABS tablet Take 1 tablet by mouth daily.     pantoprazole (PROTONIX) 40 MG tablet Take 1 tablet (40 mg total) by mouth daily. 30 tablet 5   prochlorperazine (COMPAZINE) 10 MG tablet Take 1 tablet (10 mg total) by mouth every 6 (six) hours as needed. (Patient not taking: Reported on 02/08/2022) 30 tablet 2   Propylene Glycol (SYSTANE BALANCE OP) Place 1 drop into both eyes daily.     rosuvastatin (CRESTOR) 20 MG tablet Take 1 tablet (20 mg total) by mouth daily. 90 tablet 3   tamoxifen (NOLVADEX) 20 MG tablet Take 1 tablet (20 mg total) by mouth daily. 90 tablet 3   traMADol (ULTRAM) 50 MG tablet TAKE ONE TABLET BY MOUTH EVERY 8 HOURS AS NEEDED FOR PAIN FOR UP TO 5 DAYS (DO NOT DRIVE FOR 8 HOURS AFTER TAKING) 45 tablet 5   traZODone (DESYREL) 50 MG tablet TAKE 1/2- 1 TABLET BY MOUTH EVERY NIGHT AT BEDTIME AS NEEDED SLEEP 30 tablet 3   vitamin B-12 (CYANOCOBALAMIN) 1000 MCG tablet Take 1,000 mcg by mouth daily.     No current facility-administered medications for this visit.    SURGICAL HISTORY:  Past Surgical History:  Procedure Laterality Date   ABDOMINAL HYSTERECTOMY  1978   fibroma   APPENDECTOMY  2002   BREAST EXCISIONAL BIOPSY Right 2003   negative   BREAST LUMPECTOMY WITH RADIOACTIVE SEED LOCALIZATION Left 03/23/2020   Procedure: LEFT BREAST LUMPECTOMY WITH RADIOACTIVE SEED LOCALIZATION;  Surgeon: [name], MD;  Location: [facility];  Service: General;  Laterality: Left;  RNFA   CARPAL TUNNEL RELEASE Left 2010 or 2011   CATARACT EXTRACTION W/ INTRAOCULAR LENS  IMPLANT, BILATERAL Bilateral    COLONOSCOPY W/ POLYPECTOMY  03/27/2007; n7/19/2012   2008: 4 mm adenoma, diverticulosis 2012: ileitis ? NSAID - likely, 2-3 mm cecal polyp LYMPHOID FOLLICLE, diverticulosis    ESOPHAGOGASTRODUODENOSCOPY  01/26/2011   reflux esophagitis, colonscopy done also   HAMMER TOE SURGERY  2008   left foot   HERNIA REPAIR Right 1996?   IR ANGIO INTRA EXTRACRAN SEL COM CAROTID INNOMINATE BILAT MOD SED  10/25/2021   IR ANGIO INTRA EXTRACRAN SEL COM CAROTID INNOMINATE UNI R MOD SED  11/28/2018   IR ANGIO INTRA EXTRACRAN SEL COM CAROTID INNOMINATE UNI R MOD SED  03/17/2021   IR ANGIO INTRA EXTRACRAN SEL INTERNAL CAROTID UNI R MOD SED  12/30/2018   IR ANGIO VERTEBRAL SEL SUBCLAVIAN INNOMINATE UNI R MOD SED  11/28/2018   IR ANGIO VERTEBRAL SEL SUBCLAVIAN INNOMINATE UNI R MOD SED  10/26/2021   IR ANGIO VERTEBRAL SEL VERTEBRAL UNI R MOD SED  03/18/2021   IR ANGIOGRAM FOLLOW UP STUDY  12/30/2018   IR TRANSCATH/EMBOLIZ  12/30/2018   IR US GUIDE VASC ACCESS RIGHT  11/28/2018   IR US GUIDE VASC ACCESS RIGHT  03/17/2021   ORIF ANKLE FRACTURE Right 12/22/2014   Procedure: OPEN REDUCTION INTERNAL FIXATION (ORIF) ANKLE FRACTURE;  Surgeon: Susa Day, MD;  Location: [facility];  Service: Orthopedics;  Laterality: Right;   RADIOLOGY WITH ANESTHESIA N/A 12/30/2018   Procedure: RADIOLOGY WITH ANESTHESIA;  Surgeon: Luanne Bras, MD;  Location: [facility];  Service: Radiology;  Laterality: N/A;   SHOULDER OPEN ROTATOR CUFF REPAIR Left 03/06/2013   Procedure: LEFT ROTATOR CUFF REPAIR, SUBACROMIAL DECOMPRESSION, PATCH GRAFT, MANIPULATION UNDER ANESTHESIA;  Surgeon: Johnn Hai, MD;  Location: [facility];  Service: Orthopedics;  Laterality: Left;   TONSILLECTOMY      REVIEW OF SYSTEMS:  Constitutional: positive for fatigue Eyes: negative Ears, nose, mouth, throat, and face: negative Respiratory: positive for cough, dyspnea on exertion, and sputum Cardiovascular: negative Gastrointestinal: negative Genitourinary:negative Integument/breast: negative Hematologic/lymphatic: negative Musculoskeletal:negative Neurological: negative Behavioral/Psych: negative Endocrine: negative Allergic/Immunologic: negative    PHYSICAL EXAMINATION: General appearance: alert, cooperative, fatigued, and no distress Head: Normocephalic, without obvious abnormality, atraumatic Neck: no adenopathy, no JVD, supple, symmetrical, trachea midline, and thyroid not enlarged, symmetric, no tenderness/mass/nodules Lymph nodes: Cervical, supraclavicular, and axillary nodes normal. Resp: wheezes bilaterally Back: symmetric, no curvature. ROM normal. No CVA tenderness. Cardio: regular rate and rhythm, S1, S2 normal, no murmur, click, rub or gallop GI: soft, non-tender; bowel sounds normal; no masses,  no organomegaly Extremities: extremities normal, atraumatic, no cyanosis or edema Neurologic: Alert and oriented X 3, normal strength and tone. Normal symmetric reflexes. Normal coordination and gait  ECOG PERFORMANCE STATUS: 1 - Symptomatic but completely ambulatory  Blood pressure 117/61, pulse 91, temperature 97.7 F (36.5 C), temperature source Oral, resp. rate 18, weight 161 lb 12.8 oz (73.4 kg), SpO2 91 %.  LABORATORY DATA: Lab Results  Component Value Date   WBC 5.5 03/01/2022   HGB 9.8 (L) 03/01/2022   HCT 28.2 (L) 03/01/2022   MCV 94.6 03/01/2022   PLT 231 03/01/2022      Chemistry      Component Value Date/Time   NA 136 02/22/2022 1440   K 3.7 02/22/2022 1440   CL 104 02/22/2022 1440   CO2 26 02/22/2022 1440   BUN 9 02/22/2022 1440   CREATININE 0.67 02/22/2022 1440   CREATININE 0.76 05/24/2020 1414      Component Value Date/Time   CALCIUM 8.7 (L) 02/22/2022 1440   ALKPHOS 39 02/22/2022 1440   AST 22 02/22/2022 1440   ALT 14 02/22/2022 1440   BILITOT <0.1 (L) 02/22/2022 1440       RADIOGRAPHIC STUDIES: CT Chest W Contrast  Result Date: 03/01/2022 CLINICAL DATA:  Restaging non-small cell lung cancer. * Tracking Code: BO * EXAM: CT CHEST, ABDOMEN, AND PELVIS WITH CONTRAST TECHNIQUE: Multidetector CT imaging of the chest, abdomen and pelvis was performed following the standard protocol during bolus  administration of intravenous contrast. RADIATION DOSE REDUCTION: This exam was performed according to the departmental dose-optimization program which includes automated exposure control, adjustment of the mA and/or kV according to patient size and/or use of iterative reconstruction technique. CONTRAST:  63m OMNIPAQUE IOHEXOL 300 MG/ML  SOLN COMPARISON:  PET-CT 11/09/2021.  Chest CTA 10/28/2021. FINDINGS: CT CHEST FINDINGS Cardiovascular: No acute vascular findings are demonstrated. There is diffuse atherosclerosis of the aorta, great vessels and coronary arteries. There are calcifications of the  aortic valve. The heart size is normal. There is no pericardial effusion. Mediastinum/Nodes: Small mediastinal and hilar lymph nodes are again noted which were hypermetabolic on PET-CT. Subcarinal node measures 1.2 cm on image 27/2 (1.3 cm on PET-CT). A 1.1 cm right hilar node on image 26/2 appears slightly larger, although it was not hypermetabolic on PET-CT. No other definite progressive thoracic adenopathy. Unchanged calcified right thyroid nodule measuring 1.5 cm on image 6/2, hypermetabolic on PET-CT. The trachea and esophagus appear unremarkable. Lungs/Pleura: Left pleural effusion has mildly increased in volume. No right-sided pleural effusion or pneumothorax. Dominant hypermetabolic left lower lobe pulmonary nodule is less well-defined and appears slightly larger, measuring 2.9 x 1.6 cm on image 99/4 (previously 2.5 x 2.3 cm). This difference could relate to adjacent atelectasis/treatment change. Numerous other smaller pulmonary nodules bilaterally are grossly unchanged, measuring up to 5 mm in the left lower lobe on image 57/4. Underlying moderate centrilobular and paraseptal emphysema with diffuse central airway thickening and asymmetric subpleural reticulation on the left. Musculoskeletal/Chest wall: Irregular sclerosis within the T3 vertebral body, corresponding with the hypermetabolic metastasis on PET-CT. No  other obvious osseous lesions or pathologic fractures. Fat necrosis again noted within the left breast. CT ABDOMEN AND PELVIS FINDINGS Hepatobiliary: The liver is normal in density without suspicious focal abnormality. Numerous hepatic cysts are grossly stable, not hypermetabolic on PET-CT. Stable small calcified gallstone. No evidence of gallbladder wall thickening or biliary dilatation. Pancreas: Unremarkable. No pancreatic ductal dilatation or surrounding inflammatory changes. Spleen: Normal in size without focal abnormality. Adrenals/Urinary Tract: Both adrenal glands appear normal. The kidneys appear normal without evidence of urinary tract calculus, suspicious lesion or hydronephrosis. Stable small cyst in the posterior interpolar region of the left kidney which requires no imaging follow-up. The bladder appears unremarkable for its degree of distention. Stomach/Bowel: Enteric contrast was administered and has passed into the mid colon. The stomach appears unremarkable for its degree of distension. No evidence of bowel wall thickening, distention or surrounding inflammatory change. Prominent stool in the rectum. Vascular/Lymphatic: There are no enlarged abdominal or pelvic lymph nodes. Diffuse aortic and branch vessel atherosclerosis without evidence of aneurysm or large vessel occlusion. Reproductive: Hysterectomy.  No suspicious adnexal findings. Other: Postsurgical changes from right inguinal herniorrhaphy. No ascites or peritoneal nodularity. Musculoskeletal: Mixed lytic lesion in the right superior acetabulum (image 95/2), corresponding with hypermetabolic metastasis on PET-CT. The other previously demonstrated hypermetabolic osseous lesions on PET-CT are not clearly visualized. No evidence of pathologic fracture. Multilevel spondylosis with prominent posterior osteophytes at multiple levels which may contribute to spinal stenosis. IMPRESSION: 1. The dominant hypermetabolic left lower lobe pulmonary  nodule appears slightly larger, although less well-defined, and this difference may relate to treatment and/or adjacent atelectasis. Attention on follow-up recommended. 2. The other scattered small pulmonary nodules and small mediastinal/hilar lymph nodes are similar to the previous study. One right hilar node appears slightly larger, although was not hypermetabolic on PET-CT. 3. No evidence of metastatic disease within the abdomen or pelvis. 4. Previously demonstrated osseous metastatic disease is grossly unchanged. 5. Interval enlargement of a left pleural effusion with increased left lower lobe atelectasis and subpleural reticulation. 6. Unchanged partially calcified right thyroid nodule which was hypermetabolic on PET-CT. Recommend thyroid US.(Ref: J Am Coll Radiol. 2015 Feb;12(2): 143-50). 7. Multilevel spondylosis with prominent posterior osteophytes in the lumbar spine. 8. Coronary and aortic atherosclerosis (ICD10-I70.0). Emphysema (ICD10-J43.9). Electronically Signed   By: Richardean Sale M.D.   On: 03/01/2022 10:03   CT Abdomen Pelvis W  Contrast  Result Date: 03/01/2022 CLINICAL DATA:  Restaging non-small cell lung cancer. * Tracking Code: BO * EXAM: CT CHEST, ABDOMEN, AND PELVIS WITH CONTRAST TECHNIQUE: Multidetector CT imaging of the chest, abdomen and pelvis was performed following the standard protocol during bolus administration of intravenous contrast. RADIATION DOSE REDUCTION: This exam was performed according to the departmental dose-optimization program which includes automated exposure control, adjustment of the mA and/or kV according to patient size and/or use of iterative reconstruction technique. CONTRAST:  56m OMNIPAQUE IOHEXOL 300 MG/ML  SOLN COMPARISON:  PET-CT 11/09/2021.  Chest CTA 10/28/2021. FINDINGS: CT CHEST FINDINGS Cardiovascular: No acute vascular findings are demonstrated. There is diffuse atherosclerosis of the aorta, great vessels and coronary arteries. There are  calcifications of the aortic valve. The heart size is normal. There is no pericardial effusion. Mediastinum/Nodes: Small mediastinal and hilar lymph nodes are again noted which were hypermetabolic on PET-CT. Subcarinal node measures 1.2 cm on image 27/2 (1.3 cm on PET-CT). A 1.1 cm right hilar node on image 26/2 appears slightly larger, although it was not hypermetabolic on PET-CT. No other definite progressive thoracic adenopathy. Unchanged calcified right thyroid nodule measuring 1.5 cm on image 6/2, hypermetabolic on PET-CT. The trachea and esophagus appear unremarkable. Lungs/Pleura: Left pleural effusion has mildly increased in volume. No right-sided pleural effusion or pneumothorax. Dominant hypermetabolic left lower lobe pulmonary nodule is less well-defined and appears slightly larger, measuring 2.9 x 1.6 cm on image 99/4 (previously 2.5 x 2.3 cm). This difference could relate to adjacent atelectasis/treatment change. Numerous other smaller pulmonary nodules bilaterally are grossly unchanged, measuring up to 5 mm in the left lower lobe on image 57/4. Underlying moderate centrilobular and paraseptal emphysema with diffuse central airway thickening and asymmetric subpleural reticulation on the left. Musculoskeletal/Chest wall: Irregular sclerosis within the T3 vertebral body, corresponding with the hypermetabolic metastasis on PET-CT. No other obvious osseous lesions or pathologic fractures. Fat necrosis again noted within the left breast. CT ABDOMEN AND PELVIS FINDINGS Hepatobiliary: The liver is normal in density without suspicious focal abnormality. Numerous hepatic cysts are grossly stable, not hypermetabolic on PET-CT. Stable small calcified gallstone. No evidence of gallbladder wall thickening or biliary dilatation. Pancreas: Unremarkable. No pancreatic ductal dilatation or surrounding inflammatory changes. Spleen: Normal in size without focal abnormality. Adrenals/Urinary Tract: Both adrenal glands  appear normal. The kidneys appear normal without evidence of urinary tract calculus, suspicious lesion or hydronephrosis. Stable small cyst in the posterior interpolar region of the left kidney which requires no imaging follow-up. The bladder appears unremarkable for its degree of distention. Stomach/Bowel: Enteric contrast was administered and has passed into the mid colon. The stomach appears unremarkable for its degree of distension. No evidence of bowel wall thickening, distention or surrounding inflammatory change. Prominent stool in the rectum. Vascular/Lymphatic: There are no enlarged abdominal or pelvic lymph nodes. Diffuse aortic and branch vessel atherosclerosis without evidence of aneurysm or large vessel occlusion. Reproductive: Hysterectomy.  No suspicious adnexal findings. Other: Postsurgical changes from right inguinal herniorrhaphy. No ascites or peritoneal nodularity. Musculoskeletal: Mixed lytic lesion in the right superior acetabulum (image 95/2), corresponding with hypermetabolic metastasis on PET-CT. The other previously demonstrated hypermetabolic osseous lesions on PET-CT are not clearly visualized. No evidence of pathologic fracture. Multilevel spondylosis with prominent posterior osteophytes at multiple levels which may contribute to spinal stenosis. IMPRESSION: 1. The dominant hypermetabolic left lower lobe pulmonary nodule appears slightly larger, although less well-defined, and this difference may relate to treatment and/or adjacent atelectasis. Attention on follow-up recommended. 2. The  other scattered small pulmonary nodules and small mediastinal/hilar lymph nodes are similar to the previous study. One right hilar node appears slightly larger, although was not hypermetabolic on PET-CT. 3. No evidence of metastatic disease within the abdomen or pelvis. 4. Previously demonstrated osseous metastatic disease is grossly unchanged. 5. Interval enlargement of a left pleural effusion with  increased left lower lobe atelectasis and subpleural reticulation. 6. Unchanged partially calcified right thyroid nodule which was hypermetabolic on PET-CT. Recommend thyroid US.(Ref: J Am Coll Radiol. 2015 Feb;12(2): 143-50). 7. Multilevel spondylosis with prominent posterior osteophytes in the lumbar spine. 8. Coronary and aortic atherosclerosis (ICD10-I70.0). Emphysema (ICD10-J43.9). Electronically Signed   By: Richardean Sale M.D.   On: 03/01/2022 10:03    ASSESSMENT AND PLAN: This is a very pleasant 87 years old white female diagnosed with stage IV (T1c, N3, M1C) non-small cell lung cancer, adenocarcinoma presented with left upper lobe lung nodule in addition to bilateral mediastinal lymphadenopathy as well as bilateral pulmonary nodules and multiple metastatic bone disease diagnosed in May 2023.  The patient also has a history of stage Ia breast cancer in August 2021 status post lumpectomy followed by radioactive seed localization and adjuvant radiotherapy and currently on tamoxifen. Molecular studies showed no actionable mutations except for positive KRAS G12C mutation and PD-L1 expression of 70%. She underwent palliative radiotherapy to several areas of metastatic bone disease including the hip and shoulder blade under the care of Dr. Lisbeth Renshaw The patient is currently undergoing systemic chemotherapy with carboplatin for AUC of 5, Alimta 500 Mg/M2 and Keytruda 200 Mg IV every 3 weeks status post 3 cycles. The patient has been tolerating this treatment well with no concerning adverse effects. She had repeat CT scan of the chest, abdomen and pelvis performed 2 days ago.  I personally and independently reviewed the scan images and discussed the results with the patient and her husband.   Her scan showed stable disease with some increased consolidation in the right lung secondary to her previous radiotherapy to the scapular area. I recommended for the patient to continue her treatment with the same regimen  and she will proceed with cycle #4 today. I will see her back for follow-up visit in 3 weeks for evaluation before starting cycle #5. Starting from cycle #5 she will be on maintenance treatment with Alimta and Keytruda every 3 weeks. The patient was advised to call immediately if she has any other concerning issues in the interval. The patient voices understanding of current disease status and treatment options and is in agreement with the current care plan.  All questions were answered. The patient knows to call the clinic with any problems, questions or concerns. We can certainly see the patient much sooner if necessary.  The total time spent in the appointment was 30 minutes.  Disclaimer: This note was dictated with voice recognition software. Similar sounding words can inadvertently be transcribed and may not be corrected upon review.

## 2022-03-02 DIAGNOSIS — J9611 Chronic respiratory failure with hypoxia: Secondary | ICD-10-CM | POA: Diagnosis not present

## 2022-03-02 DIAGNOSIS — J441 Chronic obstructive pulmonary disease with (acute) exacerbation: Secondary | ICD-10-CM | POA: Diagnosis not present

## 2022-03-07 ENCOUNTER — Other Ambulatory Visit: Payer: Self-pay | Admitting: Family Medicine

## 2022-03-08 ENCOUNTER — Inpatient Hospital Stay: Payer: Medicare HMO

## 2022-03-08 ENCOUNTER — Other Ambulatory Visit: Payer: Self-pay

## 2022-03-08 DIAGNOSIS — J432 Centrilobular emphysema: Secondary | ICD-10-CM | POA: Diagnosis not present

## 2022-03-08 DIAGNOSIS — C3432 Malignant neoplasm of lower lobe, left bronchus or lung: Secondary | ICD-10-CM | POA: Diagnosis not present

## 2022-03-08 DIAGNOSIS — K219 Gastro-esophageal reflux disease without esophagitis: Secondary | ICD-10-CM | POA: Diagnosis not present

## 2022-03-08 DIAGNOSIS — Z7982 Long term (current) use of aspirin: Secondary | ICD-10-CM | POA: Diagnosis not present

## 2022-03-08 DIAGNOSIS — Z5111 Encounter for antineoplastic chemotherapy: Secondary | ICD-10-CM | POA: Diagnosis not present

## 2022-03-08 DIAGNOSIS — Z7902 Long term (current) use of antithrombotics/antiplatelets: Secondary | ICD-10-CM | POA: Diagnosis not present

## 2022-03-08 DIAGNOSIS — M129 Arthropathy, unspecified: Secondary | ICD-10-CM | POA: Diagnosis not present

## 2022-03-08 DIAGNOSIS — J90 Pleural effusion, not elsewhere classified: Secondary | ICD-10-CM | POA: Diagnosis not present

## 2022-03-08 DIAGNOSIS — Z79810 Long term (current) use of selective estrogen receptor modulators (SERMs): Secondary | ICD-10-CM | POA: Diagnosis not present

## 2022-03-08 DIAGNOSIS — R918 Other nonspecific abnormal finding of lung field: Secondary | ICD-10-CM

## 2022-03-08 DIAGNOSIS — J449 Chronic obstructive pulmonary disease, unspecified: Secondary | ICD-10-CM | POA: Diagnosis not present

## 2022-03-08 DIAGNOSIS — E041 Nontoxic single thyroid nodule: Secondary | ICD-10-CM | POA: Diagnosis not present

## 2022-03-08 DIAGNOSIS — R69 Illness, unspecified: Secondary | ICD-10-CM | POA: Diagnosis not present

## 2022-03-08 DIAGNOSIS — C7951 Secondary malignant neoplasm of bone: Secondary | ICD-10-CM | POA: Diagnosis not present

## 2022-03-08 DIAGNOSIS — Z79899 Other long term (current) drug therapy: Secondary | ICD-10-CM | POA: Diagnosis not present

## 2022-03-08 DIAGNOSIS — Z8673 Personal history of transient ischemic attack (TIA), and cerebral infarction without residual deficits: Secondary | ICD-10-CM | POA: Diagnosis not present

## 2022-03-08 DIAGNOSIS — D649 Anemia, unspecified: Secondary | ICD-10-CM | POA: Diagnosis not present

## 2022-03-08 LAB — CBC WITH DIFFERENTIAL (CANCER CENTER ONLY)
Abs Immature Granulocytes: 0.01 10*3/uL (ref 0.00–0.07)
Basophils Absolute: 0 10*3/uL (ref 0.0–0.1)
Basophils Relative: 1 %
Eosinophils Absolute: 0.5 10*3/uL (ref 0.0–0.5)
Eosinophils Relative: 15 %
HCT: 27.6 % — ABNORMAL LOW (ref 36.0–46.0)
Hemoglobin: 9.4 g/dL — ABNORMAL LOW (ref 12.0–15.0)
Immature Granulocytes: 0 %
Lymphocytes Relative: 8 %
Lymphs Abs: 0.3 10*3/uL — ABNORMAL LOW (ref 0.7–4.0)
MCH: 32.9 pg (ref 26.0–34.0)
MCHC: 34.1 g/dL (ref 30.0–36.0)
MCV: 96.5 fL (ref 80.0–100.0)
Monocytes Absolute: 0.4 10*3/uL (ref 0.1–1.0)
Monocytes Relative: 12 %
Neutro Abs: 2 10*3/uL (ref 1.7–7.7)
Neutrophils Relative %: 64 %
Platelet Count: 147 10*3/uL — ABNORMAL LOW (ref 150–400)
RBC: 2.86 MIL/uL — ABNORMAL LOW (ref 3.87–5.11)
RDW: 16.5 % — ABNORMAL HIGH (ref 11.5–15.5)
WBC Count: 3.1 10*3/uL — ABNORMAL LOW (ref 4.0–10.5)
nRBC: 0 % (ref 0.0–0.2)

## 2022-03-08 LAB — CMP (CANCER CENTER ONLY)
ALT: 11 U/L (ref 0–44)
AST: 21 U/L (ref 15–41)
Albumin: 3 g/dL — ABNORMAL LOW (ref 3.5–5.0)
Alkaline Phosphatase: 35 U/L — ABNORMAL LOW (ref 38–126)
Anion gap: 6 (ref 5–15)
BUN: 13 mg/dL (ref 8–23)
CO2: 29 mmol/L (ref 22–32)
Calcium: 9.3 mg/dL (ref 8.9–10.3)
Chloride: 108 mmol/L (ref 98–111)
Creatinine: 0.71 mg/dL (ref 0.44–1.00)
GFR, Estimated: >60 mL/min (ref >60)
Glucose, Bld: 105 mg/dL — ABNORMAL HIGH (ref 70–99)
Potassium: 3.8 mmol/L (ref 3.5–5.1)
Sodium: 143 mmol/L (ref 135–145)
Total Bilirubin: 0.3 mg/dL (ref 0.3–1.2)
Total Protein: 6.5 g/dL (ref 6.5–8.1)

## 2022-03-09 DIAGNOSIS — I110 Hypertensive heart disease with heart failure: Secondary | ICD-10-CM | POA: Diagnosis not present

## 2022-03-09 DIAGNOSIS — J9611 Chronic respiratory failure with hypoxia: Secondary | ICD-10-CM | POA: Diagnosis not present

## 2022-03-09 DIAGNOSIS — I5032 Chronic diastolic (congestive) heart failure: Secondary | ICD-10-CM | POA: Diagnosis not present

## 2022-03-09 DIAGNOSIS — R6520 Severe sepsis without septic shock: Secondary | ICD-10-CM | POA: Diagnosis not present

## 2022-03-09 DIAGNOSIS — Z853 Personal history of malignant neoplasm of breast: Secondary | ICD-10-CM | POA: Diagnosis not present

## 2022-03-09 DIAGNOSIS — A419 Sepsis, unspecified organism: Secondary | ICD-10-CM | POA: Diagnosis not present

## 2022-03-09 DIAGNOSIS — C3491 Malignant neoplasm of unspecified part of right bronchus or lung: Secondary | ICD-10-CM | POA: Diagnosis not present

## 2022-03-09 DIAGNOSIS — J449 Chronic obstructive pulmonary disease, unspecified: Secondary | ICD-10-CM | POA: Diagnosis not present

## 2022-03-09 DIAGNOSIS — Z87891 Personal history of nicotine dependence: Secondary | ICD-10-CM | POA: Diagnosis not present

## 2022-03-09 DIAGNOSIS — R69 Illness, unspecified: Secondary | ICD-10-CM | POA: Diagnosis not present

## 2022-03-09 DIAGNOSIS — Z7982 Long term (current) use of aspirin: Secondary | ICD-10-CM | POA: Diagnosis not present

## 2022-03-09 DIAGNOSIS — Z9181 History of falling: Secondary | ICD-10-CM | POA: Diagnosis not present

## 2022-03-10 ENCOUNTER — Encounter: Payer: Self-pay | Admitting: Medical Oncology

## 2022-03-15 ENCOUNTER — Other Ambulatory Visit: Payer: Self-pay | Admitting: Physician Assistant

## 2022-03-15 ENCOUNTER — Inpatient Hospital Stay: Payer: Medicare HMO | Attending: Nurse Practitioner

## 2022-03-15 ENCOUNTER — Other Ambulatory Visit: Payer: Self-pay

## 2022-03-15 DIAGNOSIS — K219 Gastro-esophageal reflux disease without esophagitis: Secondary | ICD-10-CM | POA: Insufficient documentation

## 2022-03-15 DIAGNOSIS — R69 Illness, unspecified: Secondary | ICD-10-CM | POA: Diagnosis not present

## 2022-03-15 DIAGNOSIS — R918 Other nonspecific abnormal finding of lung field: Secondary | ICD-10-CM

## 2022-03-15 DIAGNOSIS — M47816 Spondylosis without myelopathy or radiculopathy, lumbar region: Secondary | ICD-10-CM | POA: Insufficient documentation

## 2022-03-15 DIAGNOSIS — E785 Hyperlipidemia, unspecified: Secondary | ICD-10-CM | POA: Insufficient documentation

## 2022-03-15 DIAGNOSIS — I10 Essential (primary) hypertension: Secondary | ICD-10-CM | POA: Insufficient documentation

## 2022-03-15 DIAGNOSIS — I110 Hypertensive heart disease with heart failure: Secondary | ICD-10-CM | POA: Diagnosis not present

## 2022-03-15 DIAGNOSIS — K7689 Other specified diseases of liver: Secondary | ICD-10-CM | POA: Diagnosis not present

## 2022-03-15 DIAGNOSIS — Z79810 Long term (current) use of selective estrogen receptor modulators (SERMs): Secondary | ICD-10-CM | POA: Diagnosis not present

## 2022-03-15 DIAGNOSIS — J432 Centrilobular emphysema: Secondary | ICD-10-CM | POA: Diagnosis not present

## 2022-03-15 DIAGNOSIS — C3432 Malignant neoplasm of lower lobe, left bronchus or lung: Secondary | ICD-10-CM | POA: Diagnosis not present

## 2022-03-15 DIAGNOSIS — Z8673 Personal history of transient ischemic attack (TIA), and cerebral infarction without residual deficits: Secondary | ICD-10-CM | POA: Diagnosis not present

## 2022-03-15 DIAGNOSIS — Z7902 Long term (current) use of antithrombotics/antiplatelets: Secondary | ICD-10-CM | POA: Insufficient documentation

## 2022-03-15 DIAGNOSIS — F1011 Alcohol abuse, in remission: Secondary | ICD-10-CM | POA: Diagnosis not present

## 2022-03-15 DIAGNOSIS — J90 Pleural effusion, not elsewhere classified: Secondary | ICD-10-CM | POA: Insufficient documentation

## 2022-03-15 DIAGNOSIS — Z5112 Encounter for antineoplastic immunotherapy: Secondary | ICD-10-CM | POA: Insufficient documentation

## 2022-03-15 DIAGNOSIS — C3491 Malignant neoplasm of unspecified part of right bronchus or lung: Secondary | ICD-10-CM | POA: Diagnosis not present

## 2022-03-15 DIAGNOSIS — Z79899 Other long term (current) drug therapy: Secondary | ICD-10-CM | POA: Insufficient documentation

## 2022-03-15 DIAGNOSIS — Z853 Personal history of malignant neoplasm of breast: Secondary | ICD-10-CM | POA: Diagnosis not present

## 2022-03-15 DIAGNOSIS — C7951 Secondary malignant neoplasm of bone: Secondary | ICD-10-CM | POA: Diagnosis not present

## 2022-03-15 DIAGNOSIS — Z7982 Long term (current) use of aspirin: Secondary | ICD-10-CM | POA: Diagnosis not present

## 2022-03-15 DIAGNOSIS — D649 Anemia, unspecified: Secondary | ICD-10-CM | POA: Diagnosis not present

## 2022-03-15 DIAGNOSIS — J449 Chronic obstructive pulmonary disease, unspecified: Secondary | ICD-10-CM | POA: Diagnosis not present

## 2022-03-15 DIAGNOSIS — R6520 Severe sepsis without septic shock: Secondary | ICD-10-CM | POA: Diagnosis not present

## 2022-03-15 DIAGNOSIS — Z87891 Personal history of nicotine dependence: Secondary | ICD-10-CM | POA: Diagnosis not present

## 2022-03-15 DIAGNOSIS — I5032 Chronic diastolic (congestive) heart failure: Secondary | ICD-10-CM | POA: Diagnosis not present

## 2022-03-15 DIAGNOSIS — Z9181 History of falling: Secondary | ICD-10-CM | POA: Diagnosis not present

## 2022-03-15 DIAGNOSIS — A419 Sepsis, unspecified organism: Secondary | ICD-10-CM | POA: Diagnosis not present

## 2022-03-15 DIAGNOSIS — J9611 Chronic respiratory failure with hypoxia: Secondary | ICD-10-CM | POA: Diagnosis not present

## 2022-03-15 LAB — CBC WITH DIFFERENTIAL (CANCER CENTER ONLY)
Abs Immature Granulocytes: 0.01 10*3/uL (ref 0.00–0.07)
Basophils Absolute: 0 10*3/uL (ref 0.0–0.1)
Basophils Relative: 0 %
Eosinophils Absolute: 0.7 10*3/uL — ABNORMAL HIGH (ref 0.0–0.5)
Eosinophils Relative: 10 %
HCT: 27.8 % — ABNORMAL LOW (ref 36.0–46.0)
Hemoglobin: 9.4 g/dL — ABNORMAL LOW (ref 12.0–15.0)
Immature Granulocytes: 0 %
Lymphocytes Relative: 4 %
Lymphs Abs: 0.3 10*3/uL — ABNORMAL LOW (ref 0.7–4.0)
MCH: 33.2 pg (ref 26.0–34.0)
MCHC: 33.8 g/dL (ref 30.0–36.0)
MCV: 98.2 fL (ref 80.0–100.0)
Monocytes Absolute: 1.1 10*3/uL — ABNORMAL HIGH (ref 0.1–1.0)
Monocytes Relative: 16 %
Neutro Abs: 4.7 10*3/uL (ref 1.7–7.7)
Neutrophils Relative %: 70 %
Platelet Count: 159 10*3/uL (ref 150–400)
RBC: 2.83 MIL/uL — ABNORMAL LOW (ref 3.87–5.11)
RDW: 17.5 % — ABNORMAL HIGH (ref 11.5–15.5)
WBC Count: 6.7 10*3/uL (ref 4.0–10.5)
nRBC: 0 % (ref 0.0–0.2)

## 2022-03-15 LAB — CMP (CANCER CENTER ONLY)
ALT: 12 U/L (ref 0–44)
AST: 21 U/L (ref 15–41)
Albumin: 3 g/dL — ABNORMAL LOW (ref 3.5–5.0)
Alkaline Phosphatase: 37 U/L — ABNORMAL LOW (ref 38–126)
Anion gap: 8 (ref 5–15)
BUN: 12 mg/dL (ref 8–23)
CO2: 27 mmol/L (ref 22–32)
Calcium: 9.2 mg/dL (ref 8.9–10.3)
Chloride: 106 mmol/L (ref 98–111)
Creatinine: 0.7 mg/dL (ref 0.44–1.00)
GFR, Estimated: >60 mL/min (ref >60)
Glucose, Bld: 99 mg/dL (ref 70–99)
Potassium: 3.9 mmol/L (ref 3.5–5.1)
Sodium: 141 mmol/L (ref 135–145)
Total Bilirubin: 0.2 mg/dL — ABNORMAL LOW (ref 0.3–1.2)
Total Protein: 6.6 g/dL (ref 6.5–8.1)

## 2022-03-15 LAB — TSH: TSH: 2.111 u[IU]/mL (ref 0.350–4.500)

## 2022-03-15 NOTE — Telephone Encounter (Signed)
Two refills on file. Gardiner Rhyme, RN

## 2022-03-22 ENCOUNTER — Inpatient Hospital Stay (HOSPITAL_BASED_OUTPATIENT_CLINIC_OR_DEPARTMENT_OTHER): Payer: Medicare HMO | Admitting: Internal Medicine

## 2022-03-22 ENCOUNTER — Inpatient Hospital Stay: Payer: Medicare HMO

## 2022-03-22 ENCOUNTER — Other Ambulatory Visit: Payer: Self-pay | Admitting: Internal Medicine

## 2022-03-22 ENCOUNTER — Ambulatory Visit (HOSPITAL_COMMUNITY)
Admission: RE | Admit: 2022-03-22 | Discharge: 2022-03-22 | Disposition: A | Discharge status: Home / Self Care | Payer: Medicare HMO | Source: Ambulatory Visit | Attending: Internal Medicine | Admitting: Internal Medicine

## 2022-03-22 VITALS — BP 116/60 | HR 84

## 2022-03-22 DIAGNOSIS — C7951 Secondary malignant neoplasm of bone: Secondary | ICD-10-CM | POA: Diagnosis not present

## 2022-03-22 DIAGNOSIS — Z79899 Other long term (current) drug therapy: Secondary | ICD-10-CM | POA: Diagnosis not present

## 2022-03-22 DIAGNOSIS — E785 Hyperlipidemia, unspecified: Secondary | ICD-10-CM | POA: Diagnosis not present

## 2022-03-22 DIAGNOSIS — C3491 Malignant neoplasm of unspecified part of right bronchus or lung: Secondary | ICD-10-CM

## 2022-03-22 DIAGNOSIS — K7689 Other specified diseases of liver: Secondary | ICD-10-CM | POA: Diagnosis not present

## 2022-03-22 DIAGNOSIS — I10 Essential (primary) hypertension: Secondary | ICD-10-CM | POA: Diagnosis not present

## 2022-03-22 DIAGNOSIS — J90 Pleural effusion, not elsewhere classified: Secondary | ICD-10-CM | POA: Diagnosis not present

## 2022-03-22 DIAGNOSIS — M47816 Spondylosis without myelopathy or radiculopathy, lumbar region: Secondary | ICD-10-CM | POA: Diagnosis not present

## 2022-03-22 DIAGNOSIS — C3432 Malignant neoplasm of lower lobe, left bronchus or lung: Secondary | ICD-10-CM | POA: Diagnosis not present

## 2022-03-22 DIAGNOSIS — Z8673 Personal history of transient ischemic attack (TIA), and cerebral infarction without residual deficits: Secondary | ICD-10-CM | POA: Diagnosis not present

## 2022-03-22 DIAGNOSIS — Z7902 Long term (current) use of antithrombotics/antiplatelets: Secondary | ICD-10-CM | POA: Diagnosis not present

## 2022-03-22 DIAGNOSIS — Z5112 Encounter for antineoplastic immunotherapy: Secondary | ICD-10-CM | POA: Diagnosis not present

## 2022-03-22 DIAGNOSIS — K219 Gastro-esophageal reflux disease without esophagitis: Secondary | ICD-10-CM | POA: Diagnosis not present

## 2022-03-22 DIAGNOSIS — Z7982 Long term (current) use of aspirin: Secondary | ICD-10-CM | POA: Diagnosis not present

## 2022-03-22 DIAGNOSIS — D649 Anemia, unspecified: Secondary | ICD-10-CM | POA: Diagnosis not present

## 2022-03-22 DIAGNOSIS — J432 Centrilobular emphysema: Secondary | ICD-10-CM | POA: Diagnosis not present

## 2022-03-22 DIAGNOSIS — R69 Illness, unspecified: Secondary | ICD-10-CM | POA: Diagnosis not present

## 2022-03-22 DIAGNOSIS — Z79810 Long term (current) use of selective estrogen receptor modulators (SERMs): Secondary | ICD-10-CM | POA: Diagnosis not present

## 2022-03-22 LAB — CBC WITH DIFFERENTIAL (CANCER CENTER ONLY)
Abs Immature Granulocytes: 0.01 10*3/uL (ref 0.00–0.07)
Basophils Absolute: 0 10*3/uL (ref 0.0–0.1)
Basophils Relative: 0 %
Eosinophils Absolute: 1 10*3/uL — ABNORMAL HIGH (ref 0.0–0.5)
Eosinophils Relative: 13 %
HCT: 28.5 % — ABNORMAL LOW (ref 36.0–46.0)
Hemoglobin: 9.7 g/dL — ABNORMAL LOW (ref 12.0–15.0)
Immature Granulocytes: 0 %
Lymphocytes Relative: 4 %
Lymphs Abs: 0.3 10*3/uL — ABNORMAL LOW (ref 0.7–4.0)
MCH: 35.1 pg — ABNORMAL HIGH (ref 26.0–34.0)
MCHC: 34 g/dL (ref 30.0–36.0)
MCV: 103.3 fL — ABNORMAL HIGH (ref 80.0–100.0)
Monocytes Absolute: 1 10*3/uL (ref 0.1–1.0)
Monocytes Relative: 14 %
Neutro Abs: 4.9 10*3/uL (ref 1.7–7.7)
Neutrophils Relative %: 69 %
Platelet Count: 204 10*3/uL (ref 150–400)
RBC: 2.76 MIL/uL — ABNORMAL LOW (ref 3.87–5.11)
RDW: 18.7 % — ABNORMAL HIGH (ref 11.5–15.5)
WBC Count: 7.1 10*3/uL (ref 4.0–10.5)
nRBC: 0 % (ref 0.0–0.2)

## 2022-03-22 LAB — CMP (CANCER CENTER ONLY)
ALT: 7 U/L (ref 0–44)
AST: 17 U/L (ref 15–41)
Albumin: 3.2 g/dL — ABNORMAL LOW (ref 3.5–5.0)
Alkaline Phosphatase: 41 U/L (ref 38–126)
Anion gap: 4 — ABNORMAL LOW (ref 5–15)
BUN: 11 mg/dL (ref 8–23)
CO2: 30 mmol/L (ref 22–32)
Calcium: 9.1 mg/dL (ref 8.9–10.3)
Chloride: 105 mmol/L (ref 98–111)
Creatinine: 0.65 mg/dL (ref 0.44–1.00)
GFR, Estimated: >60 mL/min (ref >60)
Glucose, Bld: 132 mg/dL — ABNORMAL HIGH (ref 70–99)
Potassium: 3.4 mmol/L — ABNORMAL LOW (ref 3.5–5.1)
Sodium: 139 mmol/L (ref 135–145)
Total Bilirubin: 0.3 mg/dL (ref 0.3–1.2)
Total Protein: 6.2 g/dL — ABNORMAL LOW (ref 6.5–8.1)

## 2022-03-22 MED ORDER — FUROSEMIDE 20 MG PO TABS: ORAL_TABLET | ORAL | 0 refills | Status: DC

## 2022-03-22 MED ORDER — SODIUM CHLORIDE 0.9 % IV SOLN
200.0000 mg | Freq: Once | INTRAVENOUS | Status: AC
  Administered 2022-03-22: 200 mg via INTRAVENOUS
  Filled 2022-03-22: qty 200

## 2022-03-22 MED ORDER — SODIUM CHLORIDE 0.9 % IV SOLN
500.0000 mg/m2 | Freq: Once | INTRAVENOUS | Status: AC
  Administered 2022-03-22: 900 mg via INTRAVENOUS
  Filled 2022-03-22: qty 20

## 2022-03-22 MED ORDER — PROCHLORPERAZINE MALEATE 10 MG PO TABS
10.0000 mg | ORAL_TABLET | Freq: Once | ORAL | Status: AC
  Administered 2022-03-22: 10 mg via ORAL
  Filled 2022-03-22: qty 1

## 2022-03-22 MED ORDER — SODIUM CHLORIDE 0.9 % IV SOLN: Freq: Once | INTRAVENOUS | Status: AC

## 2022-03-22 NOTE — Progress Notes (Signed)
Pt transported by tech to admitting to check in for XRAY.

## 2022-03-22 NOTE — Progress Notes (Signed)
Patient arrived to clinic with empty O2 tank.  02 Sat 74%. Patient provided O2 at 3 L. O2 sat 87%.

## 2022-03-22 NOTE — Progress Notes (Signed)
[facility] [phone]   [phone]  OFFICE PROGRESS NOTE  [address], [place] [address]  DIAGNOSIS:  1) Stage IV (T1c, N3, M1C) non-small cell lung cancer, adenocarcinoma presented with left lower lobe lung nodule in addition to bilateral mediastinal lymphadenopathy as well as bilateral pulmonary nodules and multiple metastatic bone lesions diagnosed in May 2023. 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   Guardant 360: No actionable mutations   Foundation one: Positive for KRASG12C   PDL1: 70%   PRIOR THERAPY:  1) status post left breast lumpectomy followed by radioactive seed localization and adjuvant radiotherapy and currently on tamoxifen. 2) Palliative radiation to the painful metastatic bone lesions (hip and below shoulder blade) under the care of Dr. Lisbeth Renshaw.   CURRENT THERAPY: Palliative systemic chemotherapy with carboplatin for an AUC of 5, Alimta 500 mg/m2 and Keytruda 200 mg IV every 3 weeks.  First dose 12/28/2021.  Status post 4 cycles.  Starting from cycle #5 she will be on maintenance treatment with Alimta and Keytruda every 3 weeks.   INTERVAL HISTORY: Sheila Shields 87 y.o. female returns to the clinic today for follow-up visit accompanied by her husband.  The patient is feeling fine except for the baseline shortness of breath and mild cough as well as wheezing.  Her oxygen tank was running out and her oxygen saturation was down to 74% but improved to 90% when she started on oxygen 3 L/minute.  She has no chest pain or hemoptysis.  She has no nausea, vomiting, diarrhea or constipation.  She has no headache or visual changes.  She tolerated the last cycle of her treatment fairly well.  She is here  today for evaluation before starting cycle #5 of her treatment.   MEDICAL HISTORY: Past Medical History:  Diagnosis Date   Alcoholism in remission ([facility]) 08/27/2007   Anemia    Arthritis    OA   Breast cancer ([facility])    left breast   COPD 12/17/2006   Diverticulosis 03/27/2007   GERD 05/01/2007   HYPERGLYCEMIA 08/27/2007   HYPERLIPIDEMIA 12/17/2006   HYPERTENSION 12/17/2006   Mood disorder ([facility])    hx anxiety and depression. meds short term during life transition   Pneumonia    AS A CHILD   Rectal polyp 03/27/2007   adenoma   SOB (shortness of breath) on exertion    per patient due to having COPD   TIA (transient ischemic attack)     ALLERGIES:  is allergic to alcohol, cheese, dilaudid [hydromorphone hcl], and phenytoin sodium extended.  MEDICATIONS:  Current Outpatient Medications  Medication Sig Dispense Refill   albuterol (PROAIR HFA) 108 (90 Base) MCG/ACT inhaler INHALE 2 PUFFS EVERY 4 HOURS AS NEEDED FOR COUGHING SPELLS 18 g 5   aspirin 325 MG tablet Take 1 tablet (325 mg total) by mouth daily. 30 tablet 3   azelastine (ASTELIN) 0.1 % nasal spray Place 2 sprays into both nostrils 2 (two) times daily. Use in each nostril as directed 30 mL 12   benzonatate (TESSALON) 200 MG capsule Take 1 capsule (200 mg total) by mouth 3 (three) times daily as needed for cough. 90 capsule 2   Cholecalciferol (VITAMIN D) 50 MCG (2000 UT) tablet Take 2,000 Units by mouth  daily.     clopidogrel (PLAVIX) 75 MG tablet Take 0.5 tablets (37.5 mg total) by mouth daily. 46 tablet 3   Coenzyme Q10 300 MG CAPS Take 300 mg by mouth daily.     fexofenadine (ALLEGRA) 180 MG tablet Take 180 mg by mouth daily.     fluticasone (FLONASE) 50 MCG/ACT nasal spray Place 2 sprays into both nostrils daily. 16 g 2   folic acid (FOLVITE) 1 MG tablet TAKE 1 TABLET(1 MG) BY MOUTH DAILY 30 tablet 2   Glycopyrrolate-Formoterol (BEVESPI AEROSPHERE) 9-4.8 MCG/ACT AERO Inhale 2 puffs into the lungs 2 (two) times daily. 10.7 g 5    hydrochlorothiazide (HYDRODIURIL) 25 MG tablet Take 12.5 mg by mouth every morning.     montelukast (SINGULAIR) 10 MG tablet Take 1 tablet (10 mg total) by mouth at bedtime. 30 tablet 5   Multiple Vitamin (MULTIVITAMIN WITH MINERALS) TABS tablet Take 1 tablet by mouth daily.     pantoprazole (PROTONIX) 40 MG tablet Take 1 tablet (40 mg total) by mouth daily. 30 tablet 5   Propylene Glycol (SYSTANE BALANCE OP) Place 1 drop into both eyes daily.     rosuvastatin (CRESTOR) 20 MG tablet Take 1 tablet (20 mg total) by mouth daily. 90 tablet 3   tamoxifen (NOLVADEX) 20 MG tablet Take 1 tablet (20 mg total) by mouth daily. 90 tablet 3   traMADol (ULTRAM) 50 MG tablet TAKE 1 TABLET BY MOUTH EVERY 8 HOURS FOR UP TO 5 DAYS AS NEEDED FOR PAIN. DO NOT DRIVE FOR 8 HOURS AFTER TAKING 45 tablet 5   traZODone (DESYREL) 50 MG tablet TAKE 1/2- 1 TABLET BY MOUTH EVERY NIGHT AT BEDTIME AS NEEDED SLEEP 30 tablet 3   vitamin B-12 (CYANOCOBALAMIN) 1000 MCG tablet Take 1,000 mcg by mouth daily.     prochlorperazine (COMPAZINE) 10 MG tablet Take 1 tablet (10 mg total) by mouth every 6 (six) hours as needed. (Patient not taking: Reported on 02/08/2022) 30 tablet 2   No current facility-administered medications for this visit.    SURGICAL HISTORY:  Past Surgical History:  Procedure Laterality Date   ABDOMINAL HYSTERECTOMY  1978   fibroma   APPENDECTOMY  2002   BREAST EXCISIONAL BIOPSY Right 2003   negative   BREAST LUMPECTOMY WITH RADIOACTIVE SEED LOCALIZATION Left 03/23/2020   Procedure: LEFT BREAST LUMPECTOMY WITH RADIOACTIVE SEED LOCALIZATION;  Surgeon: [name], MD;  Location: [facility];  Service: General;  Laterality: Left;  RNFA   CARPAL TUNNEL RELEASE Left 2010 or 2011   CATARACT EXTRACTION W/ INTRAOCULAR LENS  IMPLANT, BILATERAL Bilateral    COLONOSCOPY W/ POLYPECTOMY  03/27/2007; n7/19/2012   2008: 4 mm adenoma, diverticulosis 2012: ileitis ? NSAID - likely, 2-3 mm cecal polyp LYMPHOID FOLLICLE,  diverticulosis   ESOPHAGOGASTRODUODENOSCOPY  01/26/2011   reflux esophagitis, colonscopy done also   HAMMER TOE SURGERY  2008   left foot   HERNIA REPAIR Right 1996?   IR ANGIO INTRA EXTRACRAN SEL COM CAROTID INNOMINATE BILAT MOD SED  10/25/2021   IR ANGIO INTRA EXTRACRAN SEL COM CAROTID INNOMINATE UNI R MOD SED  11/28/2018   IR ANGIO INTRA EXTRACRAN SEL COM CAROTID INNOMINATE UNI R MOD SED  03/17/2021   IR ANGIO INTRA EXTRACRAN SEL INTERNAL CAROTID UNI R MOD SED  12/30/2018   IR ANGIO VERTEBRAL SEL SUBCLAVIAN INNOMINATE UNI R MOD SED  11/28/2018   IR ANGIO VERTEBRAL SEL SUBCLAVIAN INNOMINATE UNI R MOD SED  10/26/2021   IR ANGIO VERTEBRAL SEL VERTEBRAL UNI  R MOD SED  03/18/2021   IR ANGIOGRAM FOLLOW UP STUDY  12/30/2018   IR TRANSCATH/EMBOLIZ  12/30/2018   IR US GUIDE VASC ACCESS RIGHT  11/28/2018   IR US GUIDE VASC ACCESS RIGHT  03/17/2021   ORIF ANKLE FRACTURE Right 12/22/2014   Procedure: OPEN REDUCTION INTERNAL FIXATION (ORIF) ANKLE FRACTURE;  Surgeon: Susa Day, MD;  Location: [facility];  Service: Orthopedics;  Laterality: Right;   RADIOLOGY WITH ANESTHESIA N/A 12/30/2018   Procedure: RADIOLOGY WITH ANESTHESIA;  Surgeon: Luanne Bras, MD;  Location: [facility];  Service: Radiology;  Laterality: N/A;   SHOULDER OPEN ROTATOR CUFF REPAIR Left 03/06/2013   Procedure: LEFT ROTATOR CUFF REPAIR, SUBACROMIAL DECOMPRESSION, PATCH GRAFT, MANIPULATION UNDER ANESTHESIA;  Surgeon: Johnn Hai, MD;  Location: [facility];  Service: Orthopedics;  Laterality: Left;   TONSILLECTOMY      REVIEW OF SYSTEMS:  Constitutional: positive for fatigue Eyes: negative Ears, nose, mouth, throat, and face: negative Respiratory: positive for cough and dyspnea on exertion Cardiovascular: negative Gastrointestinal: negative Genitourinary:negative Integument/breast: negative Hematologic/lymphatic: negative Musculoskeletal:negative Neurological: negative Behavioral/Psych: negative Endocrine: negative Allergic/Immunologic:  negative   PHYSICAL EXAMINATION: General appearance: alert, cooperative, fatigued, and no distress Head: Normocephalic, without obvious abnormality, atraumatic Neck: no adenopathy, no JVD, supple, symmetrical, trachea midline, and thyroid not enlarged, symmetric, no tenderness/mass/nodules Lymph nodes: Cervical, supraclavicular, and axillary nodes normal. Resp: wheezes bilaterally Back: symmetric, no curvature. ROM normal. No CVA tenderness. Cardio: regular rate and rhythm, S1, S2 normal, no murmur, click, rub or gallop GI: soft, non-tender; bowel sounds normal; no masses,  no organomegaly Extremities: extremities normal, atraumatic, no cyanosis or edema Neurologic: Alert and oriented X 3, normal strength and tone. Normal symmetric reflexes. Normal coordination and gait  ECOG PERFORMANCE STATUS: 1 - Symptomatic but completely ambulatory  Blood pressure (!) 113/53, pulse 98, temperature 97.6 F (36.4 C), temperature source Oral, resp. rate 19, weight 160 lb 1 oz (72.6 kg), SpO2 (!) 87 %.  LABORATORY DATA: Lab Results  Component Value Date   WBC 7.1 03/22/2022   HGB 9.7 (L) 03/22/2022   HCT 28.5 (L) 03/22/2022   MCV 103.3 (H) 03/22/2022   PLT 204 03/22/2022      Chemistry      Component Value Date/Time   NA 141 03/15/2022 1402   K 3.9 03/15/2022 1402   CL 106 03/15/2022 1402   CO2 27 03/15/2022 1402   BUN 12 03/15/2022 1402   CREATININE 0.70 03/15/2022 1402   CREATININE 0.76 05/24/2020 1414      Component Value Date/Time   CALCIUM 9.2 03/15/2022 1402   ALKPHOS 37 (L) 03/15/2022 1402   AST 21 03/15/2022 1402   ALT 12 03/15/2022 1402   BILITOT 0.2 (L) 03/15/2022 1402       RADIOGRAPHIC STUDIES: CT Chest W Contrast  Result Date: 03/01/2022 CLINICAL DATA:  Restaging non-small cell lung cancer. * Tracking Code: BO * EXAM: CT CHEST, ABDOMEN, AND PELVIS WITH CONTRAST TECHNIQUE: Multidetector CT imaging of the chest, abdomen and pelvis was performed following the standard  protocol during bolus administration of intravenous contrast. RADIATION DOSE REDUCTION: This exam was performed according to the departmental dose-optimization program which includes automated exposure control, adjustment of the mA and/or kV according to patient size and/or use of iterative reconstruction technique. CONTRAST:  58m OMNIPAQUE IOHEXOL 300 MG/ML  SOLN COMPARISON:  PET-CT 11/09/2021.  Chest CTA 10/28/2021. FINDINGS: CT CHEST FINDINGS Cardiovascular: No acute vascular findings are demonstrated. There is diffuse atherosclerosis of the aorta, great vessels and coronary  arteries. There are calcifications of the aortic valve. The heart size is normal. There is no pericardial effusion. Mediastinum/Nodes: Small mediastinal and hilar lymph nodes are again noted which were hypermetabolic on PET-CT. Subcarinal node measures 1.2 cm on image 27/2 (1.3 cm on PET-CT). A 1.1 cm right hilar node on image 26/2 appears slightly larger, although it was not hypermetabolic on PET-CT. No other definite progressive thoracic adenopathy. Unchanged calcified right thyroid nodule measuring 1.5 cm on image 6/2, hypermetabolic on PET-CT. The trachea and esophagus appear unremarkable. Lungs/Pleura: Left pleural effusion has mildly increased in volume. No right-sided pleural effusion or pneumothorax. Dominant hypermetabolic left lower lobe pulmonary nodule is less well-defined and appears slightly larger, measuring 2.9 x 1.6 cm on image 99/4 (previously 2.5 x 2.3 cm). This difference could relate to adjacent atelectasis/treatment change. Numerous other smaller pulmonary nodules bilaterally are grossly unchanged, measuring up to 5 mm in the left lower lobe on image 57/4. Underlying moderate centrilobular and paraseptal emphysema with diffuse central airway thickening and asymmetric subpleural reticulation on the left. Musculoskeletal/Chest wall: Irregular sclerosis within the T3 vertebral body, corresponding with the hypermetabolic  metastasis on PET-CT. No other obvious osseous lesions or pathologic fractures. Fat necrosis again noted within the left breast. CT ABDOMEN AND PELVIS FINDINGS Hepatobiliary: The liver is normal in density without suspicious focal abnormality. Numerous hepatic cysts are grossly stable, not hypermetabolic on PET-CT. Stable small calcified gallstone. No evidence of gallbladder wall thickening or biliary dilatation. Pancreas: Unremarkable. No pancreatic ductal dilatation or surrounding inflammatory changes. Spleen: Normal in size without focal abnormality. Adrenals/Urinary Tract: Both adrenal glands appear normal. The kidneys appear normal without evidence of urinary tract calculus, suspicious lesion or hydronephrosis. Stable small cyst in the posterior interpolar region of the left kidney which requires no imaging follow-up. The bladder appears unremarkable for its degree of distention. Stomach/Bowel: Enteric contrast was administered and has passed into the mid colon. The stomach appears unremarkable for its degree of distension. No evidence of bowel wall thickening, distention or surrounding inflammatory change. Prominent stool in the rectum. Vascular/Lymphatic: There are no enlarged abdominal or pelvic lymph nodes. Diffuse aortic and branch vessel atherosclerosis without evidence of aneurysm or large vessel occlusion. Reproductive: Hysterectomy.  No suspicious adnexal findings. Other: Postsurgical changes from right inguinal herniorrhaphy. No ascites or peritoneal nodularity. Musculoskeletal: Mixed lytic lesion in the right superior acetabulum (image 95/2), corresponding with hypermetabolic metastasis on PET-CT. The other previously demonstrated hypermetabolic osseous lesions on PET-CT are not clearly visualized. No evidence of pathologic fracture. Multilevel spondylosis with prominent posterior osteophytes at multiple levels which may contribute to spinal stenosis. IMPRESSION: 1. The dominant hypermetabolic left  lower lobe pulmonary nodule appears slightly larger, although less well-defined, and this difference may relate to treatment and/or adjacent atelectasis. Attention on follow-up recommended. 2. The other scattered small pulmonary nodules and small mediastinal/hilar lymph nodes are similar to the previous study. One right hilar node appears slightly larger, although was not hypermetabolic on PET-CT. 3. No evidence of metastatic disease within the abdomen or pelvis. 4. Previously demonstrated osseous metastatic disease is grossly unchanged. 5. Interval enlargement of a left pleural effusion with increased left lower lobe atelectasis and subpleural reticulation. 6. Unchanged partially calcified right thyroid nodule which was hypermetabolic on PET-CT. Recommend thyroid US.(Ref: J Am Coll Radiol. 2015 Feb;12(2): 143-50). 7. Multilevel spondylosis with prominent posterior osteophytes in the lumbar spine. 8. Coronary and aortic atherosclerosis (ICD10-I70.0). Emphysema (ICD10-J43.9). Electronically Signed   By: Richardean Sale M.D.   On: 03/01/2022 10:03  CT Abdomen Pelvis W Contrast  Result Date: 03/01/2022 CLINICAL DATA:  Restaging non-small cell lung cancer. * Tracking Code: BO * EXAM: CT CHEST, ABDOMEN, AND PELVIS WITH CONTRAST TECHNIQUE: Multidetector CT imaging of the chest, abdomen and pelvis was performed following the standard protocol during bolus administration of intravenous contrast. RADIATION DOSE REDUCTION: This exam was performed according to the departmental dose-optimization program which includes automated exposure control, adjustment of the mA and/or kV according to patient size and/or use of iterative reconstruction technique. CONTRAST:  56m OMNIPAQUE IOHEXOL 300 MG/ML  SOLN COMPARISON:  PET-CT 11/09/2021.  Chest CTA 10/28/2021. FINDINGS: CT CHEST FINDINGS Cardiovascular: No acute vascular findings are demonstrated. There is diffuse atherosclerosis of the aorta, great vessels and coronary arteries.  There are calcifications of the aortic valve. The heart size is normal. There is no pericardial effusion. Mediastinum/Nodes: Small mediastinal and hilar lymph nodes are again noted which were hypermetabolic on PET-CT. Subcarinal node measures 1.2 cm on image 27/2 (1.3 cm on PET-CT). A 1.1 cm right hilar node on image 26/2 appears slightly larger, although it was not hypermetabolic on PET-CT. No other definite progressive thoracic adenopathy. Unchanged calcified right thyroid nodule measuring 1.5 cm on image 6/2, hypermetabolic on PET-CT. The trachea and esophagus appear unremarkable. Lungs/Pleura: Left pleural effusion has mildly increased in volume. No right-sided pleural effusion or pneumothorax. Dominant hypermetabolic left lower lobe pulmonary nodule is less well-defined and appears slightly larger, measuring 2.9 x 1.6 cm on image 99/4 (previously 2.5 x 2.3 cm). This difference could relate to adjacent atelectasis/treatment change. Numerous other smaller pulmonary nodules bilaterally are grossly unchanged, measuring up to 5 mm in the left lower lobe on image 57/4. Underlying moderate centrilobular and paraseptal emphysema with diffuse central airway thickening and asymmetric subpleural reticulation on the left. Musculoskeletal/Chest wall: Irregular sclerosis within the T3 vertebral body, corresponding with the hypermetabolic metastasis on PET-CT. No other obvious osseous lesions or pathologic fractures. Fat necrosis again noted within the left breast. CT ABDOMEN AND PELVIS FINDINGS Hepatobiliary: The liver is normal in density without suspicious focal abnormality. Numerous hepatic cysts are grossly stable, not hypermetabolic on PET-CT. Stable small calcified gallstone. No evidence of gallbladder wall thickening or biliary dilatation. Pancreas: Unremarkable. No pancreatic ductal dilatation or surrounding inflammatory changes. Spleen: Normal in size without focal abnormality. Adrenals/Urinary Tract: Both adrenal  glands appear normal. The kidneys appear normal without evidence of urinary tract calculus, suspicious lesion or hydronephrosis. Stable small cyst in the posterior interpolar region of the left kidney which requires no imaging follow-up. The bladder appears unremarkable for its degree of distention. Stomach/Bowel: Enteric contrast was administered and has passed into the mid colon. The stomach appears unremarkable for its degree of distension. No evidence of bowel wall thickening, distention or surrounding inflammatory change. Prominent stool in the rectum. Vascular/Lymphatic: There are no enlarged abdominal or pelvic lymph nodes. Diffuse aortic and branch vessel atherosclerosis without evidence of aneurysm or large vessel occlusion. Reproductive: Hysterectomy.  No suspicious adnexal findings. Other: Postsurgical changes from right inguinal herniorrhaphy. No ascites or peritoneal nodularity. Musculoskeletal: Mixed lytic lesion in the right superior acetabulum (image 95/2), corresponding with hypermetabolic metastasis on PET-CT. The other previously demonstrated hypermetabolic osseous lesions on PET-CT are not clearly visualized. No evidence of pathologic fracture. Multilevel spondylosis with prominent posterior osteophytes at multiple levels which may contribute to spinal stenosis. IMPRESSION: 1. The dominant hypermetabolic left lower lobe pulmonary nodule appears slightly larger, although less well-defined, and this difference may relate to treatment and/or adjacent atelectasis. Attention on  follow-up recommended. 2. The other scattered small pulmonary nodules and small mediastinal/hilar lymph nodes are similar to the previous study. One right hilar node appears slightly larger, although was not hypermetabolic on PET-CT. 3. No evidence of metastatic disease within the abdomen or pelvis. 4. Previously demonstrated osseous metastatic disease is grossly unchanged. 5. Interval enlargement of a left pleural effusion  with increased left lower lobe atelectasis and subpleural reticulation. 6. Unchanged partially calcified right thyroid nodule which was hypermetabolic on PET-CT. Recommend thyroid US.(Ref: J Am Coll Radiol. 2015 Feb;12(2): 143-50). 7. Multilevel spondylosis with prominent posterior osteophytes in the lumbar spine. 8. Coronary and aortic atherosclerosis (ICD10-I70.0). Emphysema (ICD10-J43.9). Electronically Signed   By: Richardean Sale M.D.   On: 03/01/2022 10:03    ASSESSMENT AND PLAN: This is a very pleasant 87 years old white female diagnosed with stage IV (T1c, N3, M1C) non-small cell lung cancer, adenocarcinoma presented with left upper lobe lung nodule in addition to bilateral mediastinal lymphadenopathy as well as bilateral pulmonary nodules and multiple metastatic bone disease diagnosed in May 2023.  The patient also has a history of stage Ia breast cancer in August 2021 status post lumpectomy followed by radioactive seed localization and adjuvant radiotherapy and currently on tamoxifen. Molecular studies showed no actionable mutations except for positive KRAS G12C mutation and PD-L1 expression of 70%. She underwent palliative radiotherapy to several areas of metastatic bone disease including the hip and shoulder blade under the care of Dr. Lisbeth Renshaw The patient is currently undergoing systemic chemotherapy with carboplatin for AUC of 5, Alimta 500 Mg/M2 and Keytruda 200 Mg IV every 3 weeks status post 4 cycles.  Starting from cycle #5 she will be on maintenance treatment with Alimta and Keytruda every 3 weeks The patient has been tolerating her treatment well except for mild fatigue and the baseline shortness of breath secondary to COPD. I recommended her to proceed with cycle #5 today as planned. For the shortness of breath, I will order a chest x-ray to rule out any worsening of her pleural effusion or any other acute abnormalities. I will see the patient back for follow-up visit in 3 weeks for  evaluation before starting cycle #6. For the COPD, she will continue her follow-up visit and evaluation by Dr. Lamonte Sakai. The patient was advised to call immediately if she has any other concerning symptoms in the interval. The patient voices understanding of current disease status and treatment options and is in agreement with the current care plan.  All questions were answered. The patient knows to call the clinic with any problems, questions or concerns. We can certainly see the patient much sooner if necessary. The total time spent in the appointment was 30 minutes.  Disclaimer: This note was dictated with voice recognition software. Similar sounding words can inadvertently be transcribed and may not be corrected upon review.

## 2022-03-23 ENCOUNTER — Telehealth: Payer: Self-pay

## 2022-03-23 NOTE — Telephone Encounter (Signed)
I have spoken with the pt and advised as indicated. Pt expressed understanding of this information.

## 2022-03-23 NOTE — Telephone Encounter (Signed)
-----   Message from Curt Bears, MD sent at 03/22/2022  4:56 PM EDT ----- Please let her know that I will start her on a small dose of Lasix 20 mg daily as needed for swelling or breathing issues.  Chest x-ray showed suspicious pulmonary edema.  She will need to take some potassium rich diet with Lasix. ----- Message ----- From: Interface, Rad Results In Sent: 03/22/2022   2:34 PM EDT To: Curt Bears, MD

## 2022-03-24 ENCOUNTER — Encounter: Payer: Self-pay | Admitting: Family Medicine

## 2022-03-27 ENCOUNTER — Encounter: Payer: Self-pay | Admitting: Internal Medicine

## 2022-03-29 ENCOUNTER — Encounter: Payer: Self-pay | Admitting: Family Medicine

## 2022-03-29 ENCOUNTER — Inpatient Hospital Stay: Payer: Medicare HMO

## 2022-03-29 DIAGNOSIS — D649 Anemia, unspecified: Secondary | ICD-10-CM | POA: Diagnosis not present

## 2022-03-29 DIAGNOSIS — K219 Gastro-esophageal reflux disease without esophagitis: Secondary | ICD-10-CM | POA: Diagnosis not present

## 2022-03-29 DIAGNOSIS — J90 Pleural effusion, not elsewhere classified: Secondary | ICD-10-CM | POA: Diagnosis not present

## 2022-03-29 DIAGNOSIS — J432 Centrilobular emphysema: Secondary | ICD-10-CM | POA: Diagnosis not present

## 2022-03-29 DIAGNOSIS — K7689 Other specified diseases of liver: Secondary | ICD-10-CM | POA: Diagnosis not present

## 2022-03-29 DIAGNOSIS — M47816 Spondylosis without myelopathy or radiculopathy, lumbar region: Secondary | ICD-10-CM | POA: Diagnosis not present

## 2022-03-29 DIAGNOSIS — Z5112 Encounter for antineoplastic immunotherapy: Secondary | ICD-10-CM | POA: Diagnosis not present

## 2022-03-29 DIAGNOSIS — Z7902 Long term (current) use of antithrombotics/antiplatelets: Secondary | ICD-10-CM | POA: Diagnosis not present

## 2022-03-29 DIAGNOSIS — Z8673 Personal history of transient ischemic attack (TIA), and cerebral infarction without residual deficits: Secondary | ICD-10-CM | POA: Diagnosis not present

## 2022-03-29 DIAGNOSIS — C7951 Secondary malignant neoplasm of bone: Secondary | ICD-10-CM | POA: Diagnosis not present

## 2022-03-29 DIAGNOSIS — I10 Essential (primary) hypertension: Secondary | ICD-10-CM | POA: Diagnosis not present

## 2022-03-29 DIAGNOSIS — R69 Illness, unspecified: Secondary | ICD-10-CM | POA: Diagnosis not present

## 2022-03-29 DIAGNOSIS — E785 Hyperlipidemia, unspecified: Secondary | ICD-10-CM | POA: Diagnosis not present

## 2022-03-29 DIAGNOSIS — Z79899 Other long term (current) drug therapy: Secondary | ICD-10-CM | POA: Diagnosis not present

## 2022-03-29 DIAGNOSIS — C3491 Malignant neoplasm of unspecified part of right bronchus or lung: Secondary | ICD-10-CM | POA: Diagnosis not present

## 2022-03-29 DIAGNOSIS — Z7982 Long term (current) use of aspirin: Secondary | ICD-10-CM | POA: Diagnosis not present

## 2022-03-29 DIAGNOSIS — Z79810 Long term (current) use of selective estrogen receptor modulators (SERMs): Secondary | ICD-10-CM | POA: Diagnosis not present

## 2022-03-29 DIAGNOSIS — C3432 Malignant neoplasm of lower lobe, left bronchus or lung: Secondary | ICD-10-CM | POA: Diagnosis not present

## 2022-03-29 LAB — CMP (CANCER CENTER ONLY)
ALT: 13 U/L (ref 0–44)
AST: 26 U/L (ref 15–41)
Albumin: 2.9 g/dL — ABNORMAL LOW (ref 3.5–5.0)
Alkaline Phosphatase: 38 U/L (ref 38–126)
Anion gap: 10 (ref 5–15)
BUN: 15 mg/dL (ref 8–23)
CO2: 28 mmol/L (ref 22–32)
Calcium: 9 mg/dL (ref 8.9–10.3)
Chloride: 101 mmol/L (ref 98–111)
Creatinine: 0.69 mg/dL (ref 0.44–1.00)
GFR, Estimated: >60 mL/min (ref >60)
Glucose, Bld: 159 mg/dL — ABNORMAL HIGH (ref 70–99)
Potassium: 3.1 mmol/L — ABNORMAL LOW (ref 3.5–5.1)
Sodium: 139 mmol/L (ref 135–145)
Total Bilirubin: 0.4 mg/dL (ref 0.3–1.2)
Total Protein: 7 g/dL (ref 6.5–8.1)

## 2022-03-29 LAB — CBC WITH DIFFERENTIAL (CANCER CENTER ONLY)
Abs Immature Granulocytes: 0.01 10*3/uL (ref 0.00–0.07)
Basophils Absolute: 0 10*3/uL (ref 0.0–0.1)
Basophils Relative: 1 %
Eosinophils Absolute: 0.3 10*3/uL (ref 0.0–0.5)
Eosinophils Relative: 8 %
HCT: 27.2 % — ABNORMAL LOW (ref 36.0–46.0)
Hemoglobin: 9.1 g/dL — ABNORMAL LOW (ref 12.0–15.0)
Immature Granulocytes: 0 %
Lymphocytes Relative: 5 %
Lymphs Abs: 0.2 10*3/uL — ABNORMAL LOW (ref 0.7–4.0)
MCH: 33.8 pg (ref 26.0–34.0)
MCHC: 33.5 g/dL (ref 30.0–36.0)
MCV: 101.1 fL — ABNORMAL HIGH (ref 80.0–100.0)
Monocytes Absolute: 0.6 10*3/uL (ref 0.1–1.0)
Monocytes Relative: 18 %
Neutro Abs: 2.4 10*3/uL (ref 1.7–7.7)
Neutrophils Relative %: 68 %
Platelet Count: 128 10*3/uL — ABNORMAL LOW (ref 150–400)
RBC: 2.69 MIL/uL — ABNORMAL LOW (ref 3.87–5.11)
RDW: 16.8 % — ABNORMAL HIGH (ref 11.5–15.5)
WBC Count: 3.5 10*3/uL — ABNORMAL LOW (ref 4.0–10.5)
nRBC: 0 % (ref 0.0–0.2)

## 2022-03-31 ENCOUNTER — Telehealth: Payer: Self-pay

## 2022-03-31 NOTE — Telephone Encounter (Signed)
I have spoken with the pt and advised as indicated. Pt expressed understanding of this information.

## 2022-03-31 NOTE — Telephone Encounter (Signed)
-----   Message from Curt Bears, MD sent at 03/29/2022  5:00 PM EDT ----- Please call the patient and advise her to increase the potassium rich diet and also to take her potassium chloride if she has some at home.  Thank you ----- Message ----- From: Buel Ream, Lab In [place] Sent: 03/29/2022   2:48 PM EDT To: Curt Bears, MD

## 2022-04-02 DIAGNOSIS — J441 Chronic obstructive pulmonary disease with (acute) exacerbation: Secondary | ICD-10-CM | POA: Diagnosis not present

## 2022-04-02 DIAGNOSIS — J9611 Chronic respiratory failure with hypoxia: Secondary | ICD-10-CM | POA: Diagnosis not present

## 2022-04-04 ENCOUNTER — Encounter: Payer: Self-pay | Admitting: Nurse Practitioner

## 2022-04-04 ENCOUNTER — Inpatient Hospital Stay: Payer: Medicare HMO

## 2022-04-04 ENCOUNTER — Other Ambulatory Visit: Payer: Self-pay

## 2022-04-04 ENCOUNTER — Inpatient Hospital Stay: Payer: Medicare HMO | Admitting: Nurse Practitioner

## 2022-04-04 VITALS — BP 111/53 | HR 89 | Temp 98.6°F | Resp 20 | Ht 64.0 in | Wt 158.6 lb

## 2022-04-04 DIAGNOSIS — I10 Essential (primary) hypertension: Secondary | ICD-10-CM | POA: Diagnosis not present

## 2022-04-04 DIAGNOSIS — C7951 Secondary malignant neoplasm of bone: Secondary | ICD-10-CM | POA: Diagnosis not present

## 2022-04-04 DIAGNOSIS — Z8673 Personal history of transient ischemic attack (TIA), and cerebral infarction without residual deficits: Secondary | ICD-10-CM | POA: Diagnosis not present

## 2022-04-04 DIAGNOSIS — D649 Anemia, unspecified: Secondary | ICD-10-CM | POA: Diagnosis not present

## 2022-04-04 DIAGNOSIS — Z170 Estrogen receptor positive status [ER+]: Secondary | ICD-10-CM

## 2022-04-04 DIAGNOSIS — K7689 Other specified diseases of liver: Secondary | ICD-10-CM | POA: Diagnosis not present

## 2022-04-04 DIAGNOSIS — J432 Centrilobular emphysema: Secondary | ICD-10-CM | POA: Diagnosis not present

## 2022-04-04 DIAGNOSIS — C3491 Malignant neoplasm of unspecified part of right bronchus or lung: Secondary | ICD-10-CM

## 2022-04-04 DIAGNOSIS — J90 Pleural effusion, not elsewhere classified: Secondary | ICD-10-CM | POA: Diagnosis not present

## 2022-04-04 DIAGNOSIS — Z79899 Other long term (current) drug therapy: Secondary | ICD-10-CM | POA: Diagnosis not present

## 2022-04-04 DIAGNOSIS — Z79810 Long term (current) use of selective estrogen receptor modulators (SERMs): Secondary | ICD-10-CM | POA: Diagnosis not present

## 2022-04-04 DIAGNOSIS — Z5112 Encounter for antineoplastic immunotherapy: Secondary | ICD-10-CM | POA: Diagnosis not present

## 2022-04-04 DIAGNOSIS — R69 Illness, unspecified: Secondary | ICD-10-CM | POA: Diagnosis not present

## 2022-04-04 DIAGNOSIS — C3432 Malignant neoplasm of lower lobe, left bronchus or lung: Secondary | ICD-10-CM | POA: Diagnosis not present

## 2022-04-04 DIAGNOSIS — M47816 Spondylosis without myelopathy or radiculopathy, lumbar region: Secondary | ICD-10-CM | POA: Diagnosis not present

## 2022-04-04 DIAGNOSIS — C50412 Malignant neoplasm of upper-outer quadrant of left female breast: Secondary | ICD-10-CM | POA: Diagnosis not present

## 2022-04-04 DIAGNOSIS — E785 Hyperlipidemia, unspecified: Secondary | ICD-10-CM | POA: Diagnosis not present

## 2022-04-04 DIAGNOSIS — Z7982 Long term (current) use of aspirin: Secondary | ICD-10-CM | POA: Diagnosis not present

## 2022-04-04 DIAGNOSIS — Z7902 Long term (current) use of antithrombotics/antiplatelets: Secondary | ICD-10-CM | POA: Diagnosis not present

## 2022-04-04 DIAGNOSIS — K219 Gastro-esophageal reflux disease without esophagitis: Secondary | ICD-10-CM | POA: Diagnosis not present

## 2022-04-04 LAB — CBC WITH DIFFERENTIAL (CANCER CENTER ONLY)
Abs Immature Granulocytes: 0.02 10*3/uL (ref 0.00–0.07)
Basophils Absolute: 0 10*3/uL (ref 0.0–0.1)
Basophils Relative: 0 %
Eosinophils Absolute: 1.2 10*3/uL — ABNORMAL HIGH (ref 0.0–0.5)
Eosinophils Relative: 15 %
HCT: 28 % — ABNORMAL LOW (ref 36.0–46.0)
Hemoglobin: 9.2 g/dL — ABNORMAL LOW (ref 12.0–15.0)
Immature Granulocytes: 0 %
Lymphocytes Relative: 3 %
Lymphs Abs: 0.3 10*3/uL — ABNORMAL LOW (ref 0.7–4.0)
MCH: 34.6 pg — ABNORMAL HIGH (ref 26.0–34.0)
MCHC: 32.9 g/dL (ref 30.0–36.0)
MCV: 105.3 fL — ABNORMAL HIGH (ref 80.0–100.0)
Monocytes Absolute: 1.2 10*3/uL — ABNORMAL HIGH (ref 0.1–1.0)
Monocytes Relative: 14 %
Neutro Abs: 5.6 10*3/uL (ref 1.7–7.7)
Neutrophils Relative %: 68 %
Platelet Count: 161 10*3/uL (ref 150–400)
RBC: 2.66 MIL/uL — ABNORMAL LOW (ref 3.87–5.11)
RDW: 17.5 % — ABNORMAL HIGH (ref 11.5–15.5)
WBC Count: 8.4 10*3/uL (ref 4.0–10.5)
nRBC: 0 % (ref 0.0–0.2)

## 2022-04-04 LAB — CMP (CANCER CENTER ONLY)
ALT: 13 U/L (ref 0–44)
AST: 22 U/L (ref 15–41)
Albumin: 2.9 g/dL — ABNORMAL LOW (ref 3.5–5.0)
Alkaline Phosphatase: 39 U/L (ref 38–126)
Anion gap: 4 — ABNORMAL LOW (ref 5–15)
BUN: 10 mg/dL (ref 8–23)
CO2: 31 mmol/L (ref 22–32)
Calcium: 8.8 mg/dL — ABNORMAL LOW (ref 8.9–10.3)
Chloride: 104 mmol/L (ref 98–111)
Creatinine: 0.79 mg/dL (ref 0.44–1.00)
GFR, Estimated: >60 mL/min (ref >60)
Glucose, Bld: 124 mg/dL — ABNORMAL HIGH (ref 70–99)
Potassium: 3.7 mmol/L (ref 3.5–5.1)
Sodium: 139 mmol/L (ref 135–145)
Total Bilirubin: 0.4 mg/dL (ref 0.3–1.2)
Total Protein: 6.5 g/dL (ref 6.5–8.1)

## 2022-04-04 NOTE — Progress Notes (Signed)
[place]   [phone] [phone]   Clinic Follow up Note   Patient Care Team: Sheila Olp, MD as PCP - General (Family Medicine) Sheila Partridge, DO as Consulting Physician (Neurology) Sheila Gobble, MD as Consulting Physician (Pulmonary Disease) Sheila Shields, P.A. as Consulting Physician Sheila Matin, MD as Consulting Physician (Dermatology) Sheila Bras, MD as Consulting Physician (Interventional Radiology) Sheila Kaufmann, RN as Oncology Nurse Navigator Sheila Germany, RN as Oncology Nurse Navigator Sheila Merle, MD as Consulting Physician (Hematology) Sheila Klein, MD as Consulting Physician (General Surgery) Sheila Rudd, MD as Consulting Physician (Radiation Oncology) Sheila Feeling, NP as Nurse Practitioner (Nurse Practitioner) Sheila Hart, RN as Oncology Nurse Navigator (Oncology) 04/04/2022  CHIEF COMPLAINT: Follow-up left breast cancer  SUMMARY OF ONCOLOGIC HISTORY: Oncology History Overview Note  Cancer Staging Malignant neoplasm of upper-outer quadrant of left breast in female, estrogen receptor positive ([place]) Staging form: Breast, AJCC 8th Edition - Clinical stage from 02/09/2020: Stage IA (cT1b, cN0, cM0, G2, ER+, PR+, HER2-) - Signed by Sheila Merle, MD on 02/17/2020 Stage prefix: Initial diagnosis Histologic grading system: 3 grade system - Pathologic stage from 03/23/2020: Stage Unknown (pT1c, pNX, cM0, G2, ER+, PR+, HER2-) - Signed by Sheila Merle, MD on 05/25/2020 Stage prefix: Initial diagnosis Multigene prognostic tests performed: None Histologic grading system: 3 grade system    Malignant neoplasm of upper-outer quadrant of left breast in female, estrogen receptor positive ([place])  01/30/2020 Mammogram   IMPRESSION: 0.6x0.4x0.6cm suspicious mass 12 o'clock axis left breast, 3 cm from the nipple.    02/09/2020 Receptors her2   PROGNOSTIC INDICATORS Results: IMMUNOHISTOCHEMICAL AND MORPHOMETRIC  ANALYSIS PERFORMED MANUALLY The tumor cells are NEGATIVE for Her2 (1+). Estrogen Receptor: 95%, POSITIVE, STRONG STAINING INTENSITY Progesterone Receptor: 90%, POSITIVE, MODERATE STAINING INTENSITY Proliferation Marker Ki67: 15%   02/09/2020 Cancer Staging   Staging form: Breast, AJCC 8th Edition - Clinical stage from 02/09/2020: Stage IA (cT1b, cN0, cM0, G2, ER+, PR+, HER2-) - Signed by Sheila Merle, MD on 02/17/2020   02/12/2020 Initial Diagnosis   Malignant neoplasm of upper-outer quadrant of left breast in female, estrogen receptor positive ([place])   02/16/2020 Initial Biopsy   Diagnosis Breast, left, needle core biopsy, 12:30, 3 cmfn - INVASIVE MAMMARY CARCINOMA, SEE COMMENT. - MAMMARY CARCINOMA IN SITU. Microscopic Comment The carcinoma appears grade 2 and measures 11 mm in greatest linear extent. E-cadherin will be ordered. Prognostic makers will be ordered. Dr. Saralyn Pilar has reviewed the case. The case was called to [facility] on 02/10/2020.   03/23/2020 Surgery   LEFT BREAST LUMPECTOMY WITH RADIOACTIVE SEED LOCALIZATION by Dr Barry Dienes     03/23/2020 Pathology Results   FINAL MICROSCOPIC DIAGNOSIS:   A. BREAST, LEFT, LUMPECTOMY:  - Invasive lobular carcinoma, 1.1 cm, Nottingham grade 2 of 3.  - Invasive carcinoma focally involves the medial margin.  - Biopsy clip site.  - See oncology table.   B. BREAST, LEFT, ADDITIONAL MEDIAL MARGIN, EXCISION:  - Breast tissue; no invasive carcinoma identified.   C. BREAST, LEFT, ADDITIONAL SUPERIOR MARGIN, EXCISION:  - Breast tissue; no invasive carcinoma identified.   D. BREAST, LEFT, ADDITIONAL POSTERIOR MARGIN, EXCISION:  - Breast tissue; no invasive carcinoma identified.    03/23/2020 Cancer Staging   Staging form: Breast, AJCC 8th Edition - Pathologic stage from 03/23/2020: Stage Unknown (pT1c, pNX, cM0, G2, ER+, PR+, HER2-) - Signed by Sheila Merle, MD on 05/25/2020   04/29/2020 - 05/26/2020 Radiation Therapy  Adjuvant  Radiation with Dr Sheila Shields 04/29/20-05/26/20    06/2020 -  Anti-estrogen oral therapy   Tamoxifen 31m once daily starting 06/2020   08/30/2020 Survivorship   SCP done virtually by [name] NP. Care plan sent to patient via Mychart    Adenocarcinoma of right lung, stage 4 (Sheila Shields  12/21/2021 Initial Diagnosis   Aden[place] of right lung, stage 4 ([place]   12/28/2021 -  Chemotherapy   Patient is on Treatment Plan : LUNG Carboplatin (5) + Pemetrexed (500) + Pembrolizumab (200) D1 q21d Induction x 4 cycles / Maintenance Pemetrexed (500) + Pembrolizumab (200) D1 q21d     03/01/2022 Cancer Staging   Staging form: Lung, AJCC 8th Edition - Clinical: Stage IVB (cT1c, cN3, cM1c) - Signed by MCurt Bears MD on 03/01/2022     CURRENT THERAPY: Tamoxifen 20 mg p.o. once daily starting 06/2020  INTERVAL HISTORY: Sheila Shields for follow-up as scheduled, last seen by me 09/12/2021.  In the interim she developed cough and shortness of breath, CTA 10/28/2021 showed a 2.5 x 2.3 mass in the left lower lobe as well as multiple bilateral lung nodules and liver lesions concerning for metastatic disease.  A 3.5 x 2.5 x 3.4 left breast mass adjacent to surgical clips was also seen which was felt to be fat necrosis on PET scan, imaging supported metastatic lung cancer and confirmed on bone biopsy which showed metastatic adenocarcinoma consistent with lung primary.  This is followed by Dr. MJulien Nordmannand she is currently on systemic chemo with carboplatin/Alimta plus pembrolizumab s/p 5 cycles, next treatment will be due in 1 week. She is tolerating treatment except low energy. Cough is stable.  Only on 3 L oxygen.  From a breast cancer standpoint she is doing well, tolerating tamoxifen.  She has some occasional hot flashes and joint pain but cannot definitively say this is from tamoxifen.  Denies new lump/mass in her breast, nipple discharge or inversion, or skin change.  Dr. MEarlie Serverrecommend to defer  mammogram.  All other systems were reviewed with the patient and are negative.  MEDICAL HISTORY:  Past Medical History:  Diagnosis Date   [place] 08/27/2007   Anemia    Arthritis    OA   [place]    left breast   COPD 12/17/2006   Diverticulosis 03/27/2007   GERD 05/01/2007   HYPERGLYCEMIA 08/27/2007   HYPERLIPIDEMIA 12/17/2006   HYPERTENSION 12/17/2006   Mood disorder ([facility]    hx anxiety and depression. meds short term during life transition   Pneumonia    AS A CHILD   Rectal polyp 03/27/2007   adenoma   SOB (shortness of breath) on exertion    per patient due to having COPD   TIA (transient ischemic attack)     SURGICAL HISTORY: Past Surgical History:  Procedure Laterality Date   ABDOMINAL HYSTERECTOMY  1978   fibroma   APPENDECTOMY  2002   BREAST EXCISIONAL BIOPSY Right 2003   negative   BREAST LUMPECTOMY WITH RADIOACTIVE SEED LOCALIZATION Left 03/23/2020   Procedure: LEFT BREAST LUMPECTOMY WITH RADIOACTIVE SEED LOCALIZATION;  Surgeon: BStark Klein MD;  [facility]  Service: General;  Laterality: Left;  RNFA   CARPAL TUNNEL RELEASE Left 2010 or 2011   CATARACT EXTRACTION W/ INTRAOCULAR LENS  IMPLANT, BILATERAL Bilateral    COLONOSCOPY W/ POLYPECTOMY  03/27/2007; n7/19/2012   2008: 4 mm adenoma, diverticulosis 2012: ileitis ? NSAID - likely, 2-3 mm cecal polyp LYMPHOID FOLLICLE, diverticulosis  ESOPHAGOGASTRODUODENOSCOPY  01/26/2011   reflux esophagitis, colonscopy done also   HAMMER TOE SURGERY  2008   left foot   HERNIA REPAIR Right 1996?   IR ANGIO INTRA EXTRACRAN SEL COM CAROTID INNOMINATE BILAT MOD SED  10/25/2021   IR ANGIO INTRA EXTRACRAN SEL COM CAROTID INNOMINATE UNI R MOD SED  11/28/2018   IR ANGIO INTRA EXTRACRAN SEL COM CAROTID INNOMINATE UNI R MOD SED  03/17/2021   IR ANGIO INTRA EXTRACRAN SEL INTERNAL CAROTID UNI R MOD SED  12/30/2018   IR ANGIO VERTEBRAL SEL SUBCLAVIAN INNOMINATE UNI R MOD SED  11/28/2018   IR ANGIO  VERTEBRAL SEL SUBCLAVIAN INNOMINATE UNI R MOD SED  10/26/2021   IR ANGIO VERTEBRAL SEL VERTEBRAL UNI R MOD SED  03/18/2021   IR ANGIOGRAM FOLLOW UP STUDY  12/30/2018   IR TRANSCATH/EMBOLIZ  12/30/2018   IR US GUIDE VASC ACCESS RIGHT  11/28/2018   IR US GUIDE VASC ACCESS RIGHT  03/17/2021   ORIF ANKLE FRACTURE Right 12/22/2014   Procedure: OPEN REDUCTION INTERNAL FIXATION (ORIF) ANKLE FRACTURE;  Surgeon: Susa Day, MD;  Location: [facility];  Service: Orthopedics;  Laterality: Right;   RADIOLOGY WITH ANESTHESIA N/A 12/30/2018   Procedure: RADIOLOGY WITH ANESTHESIA;  Surgeon: Sheila Bras, MD;  Location: [facility];  Service: Radiology;  Laterality: N/A;   SHOULDER OPEN ROTATOR CUFF REPAIR Left 03/06/2013   Procedure: LEFT ROTATOR CUFF REPAIR, SUBACROMIAL DECOMPRESSION, PATCH GRAFT, MANIPULATION UNDER ANESTHESIA;  Surgeon: Johnn Hai, MD;  Location: [facility];  Service: Orthopedics;  Laterality: Left;   TONSILLECTOMY      I have reviewed the social history and family history with the patient and they are unchanged from previous note.  ALLERGIES:  is allergic to alcohol, cheese, dilaudid [hydromorphone hcl], and phenytoin sodium extended.  MEDICATIONS:  Current Outpatient Medications  Medication Sig Dispense Refill   albuterol (PROAIR HFA) 108 (90 Base) MCG/ACT inhaler INHALE 2 PUFFS EVERY 4 HOURS AS NEEDED FOR COUGHING SPELLS 18 g 5   aspirin 325 MG tablet Take 1 tablet (325 mg total) by mouth daily. 30 tablet 3   azelastine (ASTELIN) 0.1 % nasal spray Place 2 sprays into both nostrils 2 (two) times daily. Use in each nostril as directed 30 mL 12   benzonatate (TESSALON) 200 MG capsule Take 1 capsule (200 mg total) by mouth 3 (three) times daily as needed for cough. 90 capsule 2   Cholecalciferol (VITAMIN D) 50 MCG (2000 UT) tablet Take 2,000 Units by mouth daily.     clopidogrel (PLAVIX) 75 MG tablet Take 0.5 tablets (37.5 mg total) by mouth daily. 46 tablet 3   Coenzyme Q10 300 MG CAPS Take 300  mg by mouth daily.     fexofenadine (ALLEGRA) 180 MG tablet Take 180 mg by mouth daily.     fluticasone (FLONASE) 50 MCG/ACT nasal spray Place 2 sprays into both nostrils daily. 16 g 2   folic acid (FOLVITE) 1 MG tablet TAKE 1 TABLET(1 MG) BY MOUTH DAILY 30 tablet 2   furosemide (LASIX) 20 MG tablet 1 tablet p.o. daily only as needed for swelling or worsening shortness of breath. 10 tablet 0   Glycopyrrolate-Formoterol (BEVESPI AEROSPHERE) 9-4.8 MCG/ACT AERO Inhale 2 puffs into the lungs 2 (two) times daily. 10.7 g 5   hydrochlorothiazide (HYDRODIURIL) 25 MG tablet Take 12.5 mg by mouth every morning.     montelukast (SINGULAIR) 10 MG tablet Take 1 tablet (10 mg total) by mouth at bedtime. 30 tablet 5  Multiple Vitamin (MULTIVITAMIN WITH MINERALS) TABS tablet Take 1 tablet by mouth daily.     pantoprazole (PROTONIX) 40 MG tablet Take 1 tablet (40 mg total) by mouth daily. 30 tablet 5   prochlorperazine (COMPAZINE) 10 MG tablet Take 1 tablet (10 mg total) by mouth every 6 (six) hours as needed. (Patient not taking: Reported on 02/08/2022) 30 tablet 2   Propylene Glycol (SYSTANE BALANCE OP) Place 1 drop into both eyes daily.     rosuvastatin (CRESTOR) 20 MG tablet Take 1 tablet (20 mg total) by mouth daily. 90 tablet 3   tamoxifen (NOLVADEX) 20 MG tablet Take 1 tablet (20 mg total) by mouth daily. 90 tablet 3   traMADol (ULTRAM) 50 MG tablet TAKE 1 TABLET BY MOUTH EVERY 8 HOURS FOR UP TO 5 DAYS AS NEEDED FOR PAIN. DO NOT DRIVE FOR 8 HOURS AFTER TAKING 45 tablet 5   traZODone (DESYREL) 50 MG tablet TAKE 1/2- 1 TABLET BY MOUTH EVERY NIGHT AT BEDTIME AS NEEDED SLEEP 30 tablet 3   vitamin B-12 (CYANOCOBALAMIN) 1000 MCG tablet Take 1,000 mcg by mouth daily.     No current facility-administered medications for this visit.    PHYSICAL EXAMINATION: ECOG PERFORMANCE STATUS: 1 - Symptomatic but completely ambulatory  Vitals:   04/04/22 1020  BP: (!) 111/53  Pulse: 89  Resp: 20  Temp: 98.6 F (37 C)   SpO2: 90%   Filed Weights   04/04/22 1020  Weight: 158 lb 9.6 oz (71.9 kg)    GENERAL:alert, no distress and comfortable SKIN: Dry, no rash EYES: sclera clear NECK: Without mass LYMPH:  no palpable cervical or supraclavicular lymphadenopathy  LUNGS: Decreased, normal breathing effort on 3 L nasal cannula HEART: regular rate & rhythm, trace ankle edema NEURO: alert & oriented x 3 with fluent speech Breast exam: Performed sitting in wheelchair.  Breasts are symmetrical without nipple discharge or inversion.  S/p left lumpectomy, incisions completely healed.  >3 cm soft tissue lump in the upper outer breast at the site of fat necrosis on PET scan.  No other palpable mass or nodularity in either breast or axilla that I could appreciate.  LABORATORY DATA:  I have reviewed the data as listed    Latest Ref Rng & Units 04/04/2022    9:52 AM 03/29/2022    2:18 PM 03/22/2022   10:59 AM  CBC  WBC 4.0 - 10.5 K/uL 8.4  3.5  7.1   Hemoglobin 12.0 - 15.0 g/dL 9.2  9.1  9.7   Hematocrit 36.0 - 46.0 % 28.0  27.2  28.5   Platelets 150 - 400 K/uL 161  128  204         Latest Ref Rng & Units 04/04/2022    9:52 AM 03/29/2022    2:18 PM 03/22/2022   10:59 AM  CMP  Glucose 70 - 99 mg/dL 124  159  132   BUN 8 - 23 mg/dL _0 Creatinine 0.44 - 1.00 mg/dL 0.79  0.69  0.65   Sodium 135 - 145 mmol/L 139  139  139   Potassium 3.5 - 5.1 mmol/L 3.7  3.1  3.4   Chloride 98 - 111 mmol/L 104  101  105   CO2 22 - 32 mmol/L _1 Calcium 8.9 - 10.3 mg/dL 8.8  9.0  9.1   Total Protein 6.5 - 8.1 g/dL 6.5  7.0  6.2   Total Bilirubin 0.3 -  1.2 mg/dL 0.4  0.4  0.3   Alkaline Phos 38 - 126 U/L 39  38  41   AST 15 - 41 U/L _0 ALT 0 - 44 U/L _1 RADIOGRAPHIC STUDIES: I have personally reviewed the radiological images as listed and agreed with the findings in the report. No results found.   ASSESSMENT & PLAN:  Sheila Shields is a 87 y.o. female with    1. Malignant  neoplasm of upper-outer quadrant of left breast, Stage IA, pT1cN0M0, lobular carcinoma, ER+/PR+/HER2-, Grade II -Diagnosed in 02/2020 with invasive lobular carcinoma with components of LCIS.  S/p left lumpectomy with Dr Barry Dienes on 03/23/20 and adjuvant RT.  -She started tamoxifen in 06/2020, tolerating well with mild hot flash -most recent mammogram on 02/08/21 was benign -She was subsequently diagnosed with stage IV lung cancer, currently on palliative chemo and immunotherapy.  Lung cancer staging CT showed 3 cm upper outer left breast mass which was not hypermetabolic on PET scan, felt to be fat necrosis.  This is palpable on today's exam which is otherwise negative -Ms. Gathright is clinically doing well from a breast cancer standpoint, tolerating tamoxifen.  No clinical concern for recurrence. -Mammograms are on hold given her stage IV lung cancer, we will monitor her clinically for now unless she develops new breast complaints. -Continue tamoxifen for now, given her good tolerance.  If her condition declines, I have a low threshold to stop tamoxifen -Follow-up in 6 months  2.  Non-small cell lung cancer, adenocarcinoma, left lung (T1c, N3, M1c) stage IV, KRAS G12 C + and PD-L1 expression 70% -Presented with cough and hypoxia, work-up showed left lower lobe lung nodule, bilateral mediastinal lymphadenopathy and lung nodules, liver lesions, and multiple metastatic bone lesions -Diagnosed on bone biopsy 11/2021 -S/p palliative radiation to painful bone lesions by Dr. Lisbeth Shields -S/p carbo/Alimta and Keytruda every 3 weeks x4 cycles, currently on maintenance Alimta and Keytruda every 3 weeks starting 03/22/2022 with cycle 5.  Next treatment 10/4 already scheduled -Currently on 3 L supplemental oxygen  -Most recent scan 02/27/2022 showed stable disease  -followed by Dr. Julien Nordmann   3. Bone Health, Cancer Screening -Baseline DEXA in 01/2015 was +0.7. Recent repeat 02/08/21 remains normal at +0.4. -Repeat in 2024 or  2025   4. Comorbidities: Brain Aneurysm, HTN, HLD, GERD, COPD, Osteoarthritis -Her brain aneurysm was banded in 12/2018. She continues to f/u with Dr Vickey Sages. She is on 369m aspirin.  -She did have very mild TIA and is seen by Dr JTana Felts She is also on half tablet Plavix due to easy bruising.    4. H/o of alcohol and nicotine use.  -She was also a heavy smoker and quit smoking in 1996. 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 MEarlie Server  All questions were answered. The patient knows to call the clinic with any problems, questions or concerns. No barriers to learning was detected. I spent 20 minutes counseling the patient face to face. The total time spent in the appointment was 30 minutes and more than 50% was on counseling and review of test results.     LAlla Feeling NP 04/04/22

## 2022-04-05 ENCOUNTER — Other Ambulatory Visit: Payer: Self-pay

## 2022-04-05 ENCOUNTER — Inpatient Hospital Stay: Payer: Medicare HMO

## 2022-04-10 NOTE — Progress Notes (Unsigned)
[facility] OFFICE PROGRESS NOTE  Marin Olp, MD [address] [place] [address]  DIAGNOSIS:  1) Stage IV (T1c, N3, M1C) non-small cell lung cancer, adenocarcinoma presented with left lower lobe lung nodule in addition to bilateral mediastinal lymphadenopathy as well as bilateral pulmonary nodules and multiple metastatic bone lesions diagnosed in May 2023. 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   Guardant 360: No actionable mutations   Foundation one: Positive for KRASG12C   PDL1: 70%  PRIOR THERAPY:  1) status post left breast lumpectomy followed by radioactive seed localization and adjuvant radiotherapy and currently on tamoxifen. 2) Palliative radiation to the painful metastatic bone lesions (hip and below shoulder blade) under the care of Dr. Lisbeth Renshaw.  CURRENT THERAPY: Palliative systemic chemotherapy with carboplatin for an AUC of 5, Alimta 500 mg/m2 and Keytruda 200 mg IV every 3 weeks.  First dose 12/28/2021.  Status post 5 cycles.  Starting from cycle #5 she will be on maintenance treatment with Alimta and Keytruda every 3 weeks.  INTERVAL HISTORY: Sheila Shields 87 y.o. female returns to the clinic today for a follow-up visit accompanied by her husband.  At the patient's last appointment with Dr. Julien Nordmann on 03/22/2022, the patient was having decreased oxygen saturation and worsening cough.  Dr. Julien Nordmann performed an x-ray which showed some edema and she was started on Lasix. The swelling in her legs improved (she has mild chronic right ankle swelling from ankle injury) and now her ankles appear at her baseline. She did not notice improvement in her cough.  She is also on 3 L of supplemental oxygen at baseline.  She sees Dr. Lamonte Sakai from pulmonology for her  COPD. She reports frequent cough. She takes tessalon in the AM. The sputum is clear. She is compliant with her inhalers. She monitors her oxygen saturation at home. She does not cough at night. She tried robitussin in the past which is helpful. She is going to start trying that again. She has a history of alcohol abuse several decades ago. If absolutely needed if no improvement with robitussin, she would be willing to try hycodan.   Otherwise since last being seen she denies any changes in her health.  Denies any fever, chills, or unexplained weight loss.  Denies any chest pain or hemoptysis. Denies any nausea, vomiting, diarrhea, or constipation. She takes a stool softener daily which keeps her bowels regular. Denies any headache or visual changes.  Denies any rashes or skin changes.  She is here today for evaluation and repeat blood work before undergoing cycle #6.    MEDICAL HISTORY: Past Medical History:  Diagnosis Date   Alcoholism in remission ([facility]) 08/27/2007   Anemia    Arthritis    OA   Breast cancer ([facility])    left breast   COPD 12/17/2006   Diverticulosis 03/27/2007   GERD 05/01/2007   HYPERGLYCEMIA 08/27/2007   HYPERLIPIDEMIA 12/17/2006   HYPERTENSION 12/17/2006   Mood disorder ([facility])    hx anxiety and depression. meds short term during life transition   Pneumonia    AS A CHILD   Rectal polyp 03/27/2007   adenoma   SOB (shortness of breath) on exertion    per patient due to having COPD   TIA (transient ischemic attack)     ALLERGIES:  is allergic to alcohol, cheese, dilaudid [hydromorphone hcl], and phenytoin sodium extended.  MEDICATIONS:  Current Outpatient Medications  Medication Sig Dispense Refill   HYDROcodone bit-homatropine (HYCODAN) 5-1.5 MG/5ML syrup Take 5 mLs by mouth every 6 (six) hours as needed for cough. 120 mL 0   albuterol (PROAIR HFA) 108 (90 Base) MCG/ACT inhaler INHALE 2 PUFFS EVERY 4 HOURS AS NEEDED FOR COUGHING SPELLS 18 g 5   aspirin 325 MG tablet Take 1  tablet (325 mg total) by mouth daily. 30 tablet 3   azelastine (ASTELIN) 0.1 % nasal spray Place 2 sprays into both nostrils 2 (two) times daily. Use in each nostril as directed 30 mL 12   benzonatate (TESSALON) 200 MG capsule Take 1 capsule (200 mg total) by mouth 3 (three) times daily as needed for cough. 90 capsule 2   Cholecalciferol (VITAMIN D) 50 MCG (2000 UT) tablet Take 2,000 Units by mouth daily.     clopidogrel (PLAVIX) 75 MG tablet Take 0.5 tablets (37.5 mg total) by mouth daily. 46 tablet 3   Coenzyme Q10 300 MG CAPS Take 300 mg by mouth daily.     fexofenadine (ALLEGRA) 180 MG tablet Take 180 mg by mouth daily.     fluticasone (FLONASE) 50 MCG/ACT nasal spray Place 2 sprays into both nostrils daily. 16 g 2   folic acid (FOLVITE) 1 MG tablet TAKE 1 TABLET(1 MG) BY MOUTH DAILY 30 tablet 2   furosemide (LASIX) 20 MG tablet 1 tablet p.o. daily only as needed for swelling or worsening shortness of breath. 10 tablet 0   Glycopyrrolate-Formoterol (BEVESPI AEROSPHERE) 9-4.8 MCG/ACT AERO Inhale 2 puffs into the lungs 2 (two) times daily. 10.7 g 5   hydrochlorothiazide (HYDRODIURIL) 25 MG tablet Take 12.5 mg by mouth every morning.     montelukast (SINGULAIR) 10 MG tablet Take 1 tablet (10 mg total) by mouth at bedtime. 30 tablet 5   Multiple Vitamin (MULTIVITAMIN WITH MINERALS) TABS tablet Take 1 tablet by mouth daily.     pantoprazole (PROTONIX) 40 MG tablet Take 1 tablet (40 mg total) by mouth daily. 30 tablet 5   prochlorperazine (COMPAZINE) 10 MG tablet Take 1 tablet (10 mg total) by mouth every 6 (six) hours as needed. (Patient not taking: Reported on 02/08/2022) 30 tablet 2   Propylene Glycol (SYSTANE BALANCE OP) Place 1 drop into both eyes daily.     rosuvastatin (CRESTOR) 20 MG tablet Take 1 tablet (20 mg total) by mouth daily. 90 tablet 3   tamoxifen (NOLVADEX) 20 MG tablet Take 1 tablet (20 mg total) by mouth daily. 90 tablet 3   traMADol (ULTRAM) 50 MG tablet TAKE 1 TABLET BY MOUTH  EVERY 8 HOURS FOR UP TO 5 DAYS AS NEEDED FOR PAIN. DO NOT DRIVE FOR 8 HOURS AFTER TAKING 45 tablet 5   traZODone (DESYREL) 50 MG tablet TAKE 1/2- 1 TABLET BY MOUTH EVERY NIGHT AT BEDTIME AS NEEDED SLEEP 30 tablet 3   vitamin B-12 (CYANOCOBALAMIN) 1000 MCG tablet Take 1,000 mcg by mouth daily.     No current facility-administered medications for this visit.    SURGICAL HISTORY:  Past Surgical History:  Procedure Laterality Date   ABDOMINAL HYSTERECTOMY  1978   fibroma   APPENDECTOMY  2002   BREAST EXCISIONAL BIOPSY Right 2003   negative   BREAST LUMPECTOMY WITH RADIOACTIVE SEED LOCALIZATION Left 03/23/2020   Procedure: LEFT BREAST LUMPECTOMY WITH RADIOACTIVE SEED LOCALIZATION;  Surgeon: [name], MD;  Location: [facility];  Service: General;  Laterality: Left;  RNFA   CARPAL TUNNEL RELEASE Left 2010 or 2011   CATARACT EXTRACTION W/ INTRAOCULAR LENS  IMPLANT, BILATERAL Bilateral    COLONOSCOPY W/ POLYPECTOMY  03/27/2007; n7/19/2012   2008: 4 mm adenoma, diverticulosis 2012: ileitis ? NSAID - likely, 2-3 mm cecal polyp LYMPHOID FOLLICLE, diverticulosis   ESOPHAGOGASTRODUODENOSCOPY  01/26/2011   reflux esophagitis, colonscopy done also   HAMMER TOE SURGERY  2008   left foot   HERNIA REPAIR Right 1996?   IR ANGIO INTRA EXTRACRAN SEL COM CAROTID INNOMINATE BILAT MOD SED  10/25/2021   IR ANGIO INTRA EXTRACRAN SEL COM CAROTID INNOMINATE UNI R MOD SED  11/28/2018   IR ANGIO INTRA EXTRACRAN SEL COM CAROTID INNOMINATE UNI R MOD SED  03/17/2021   IR ANGIO INTRA EXTRACRAN SEL INTERNAL CAROTID UNI R MOD SED  12/30/2018   IR ANGIO VERTEBRAL SEL SUBCLAVIAN INNOMINATE UNI R MOD SED  11/28/2018   IR ANGIO VERTEBRAL SEL SUBCLAVIAN INNOMINATE UNI R MOD SED  10/26/2021   IR ANGIO VERTEBRAL SEL VERTEBRAL UNI R MOD SED  03/18/2021   IR ANGIOGRAM FOLLOW UP STUDY  12/30/2018   IR TRANSCATH/EMBOLIZ  12/30/2018   IR US GUIDE VASC ACCESS RIGHT  11/28/2018   IR US GUIDE VASC ACCESS RIGHT  03/17/2021   ORIF ANKLE FRACTURE  Right 12/22/2014   Procedure: OPEN REDUCTION INTERNAL FIXATION (ORIF) ANKLE FRACTURE;  Surgeon: Susa Day, MD;  Location: [facility];  Service: Orthopedics;  Laterality: Right;   RADIOLOGY WITH ANESTHESIA N/A 12/30/2018   Procedure: RADIOLOGY WITH ANESTHESIA;  Surgeon: Luanne Bras, MD;  Location: [facility];  Service: Radiology;  Laterality: N/A;   SHOULDER OPEN ROTATOR CUFF REPAIR Left 03/06/2013   Procedure: LEFT ROTATOR CUFF REPAIR, SUBACROMIAL DECOMPRESSION, PATCH GRAFT, MANIPULATION UNDER ANESTHESIA;  Surgeon: Johnn Hai, MD;  Location: [facility];  Service: Orthopedics;  Laterality: Left;   TONSILLECTOMY      REVIEW OF SYSTEMS:   Review of Systems  Constitutional: Negative for appetite change, chills, fatigue, fever and unexpected weight change.  HENT:   Negative for mouth sores, nosebleeds, sore throat and trouble swallowing.   Eyes: Negative for eye problems and icterus.  Respiratory: Positive for cough and dyspnea on exertion.  Positive for occasional wheezing. Negative for hemoptysis and wheezing.  Cardiovascular: Negative for chest pain and leg swelling.  Gastrointestinal: Negative for abdominal pain, constipation, diarrhea, nausea and vomiting.  Genitourinary: Negative for bladder incontinence, difficulty urinating, dysuria, frequency and hematuria.   Musculoskeletal: Negative for back pain, gait problem, neck pain and neck stiffness.  Skin: Negative for itching and rash.  Neurological: Negative for dizziness, extremity weakness, gait problem, headaches, light-headedness and seizures.  Hematological: Negative for adenopathy. Does not bruise/bleed easily.  Psychiatric/Behavioral: Negative for confusion, depression and sleep disturbance. The patient is not nervous/anxious.     PHYSICAL EXAMINATION:  Blood pressure 121/60, pulse 93, temperature 97.6 F (36.4 C), temperature source Oral, resp. rate 18, height 5' 4" (1.626 m), weight 154 lb 3.2 oz (69.9 kg), SpO2 90 %, peak flow (!)  3 L/min.  ECOG PERFORMANCE STATUS: 1  Physical Exam  Constitutional: Oriented to person, place, and time and well-developed, well-nourished, and in no distress.  HENT:  Head: Normocephalic and atraumatic.  Mouth/Throat: Oropharynx is clear and moist. No oropharyngeal exudate.  Eyes: Conjunctivae are normal. Right eye exhibits no discharge. Left eye exhibits no discharge. No scleral icterus.  Neck: Normal range of motion. Neck supple.  Cardiovascular: Normal rate, regular rhythm, normal heart sounds and  intact distal pulses.   Pulmonary/Chest: Effort normal and breath sounds normal. No respiratory distress. No wheezes. No rales.  Abdominal: Soft. Bowel sounds are normal. Exhibits no distension and no mass. There is no tenderness.  Musculoskeletal: Normal range of motion.  Positive for baseline mild right ankle swelling. Lymphadenopathy:    No cervical adenopathy.  Neurological: Alert and oriented to person, place, and time. Exhibits normal muscle tone.  Ambulates with a walker. Skin: Skin is warm and dry. No rash noted. Not diaphoretic. No erythema. No pallor.  Psychiatric: Mood, memory and judgment normal.  Vitals reviewed.  LABORATORY DATA: Lab Results  Component Value Date   WBC 8.9 04/12/2022   HGB 10.1 (L) 04/12/2022   HCT 30.9 (L) 04/12/2022   MCV 106.2 (H) 04/12/2022   PLT 252 04/12/2022      Chemistry      Component Value Date/Time   NA 139 04/12/2022 1022   K 3.7 04/12/2022 1022   CL 103 04/12/2022 1022   CO2 30 04/12/2022 1022   BUN 13 04/12/2022 1022   CREATININE 0.69 04/12/2022 1022   CREATININE 0.76 05/24/2020 1414      Component Value Date/Time   CALCIUM 9.0 04/12/2022 1022   ALKPHOS 38 04/12/2022 1022   AST 18 04/12/2022 1022   ALT 7 04/12/2022 1022   BILITOT 0.4 04/12/2022 1022       RADIOGRAPHIC STUDIES:  DG Chest 1 View  Result Date: 03/22/2022 CLINICAL DATA:  Chest x-ray for evaluation of pleural effusion. EXAM: CHEST  1 VIEW COMPARISON:   Chest x-ray June 25, 23. FINDINGS: Decreased, small left pleural effusion. Slightly increased diffuse interstitial opacities with increased patchy airspace opacities in left lung. No visible pneumothorax. Similar cardiomediastinal silhouette. IMPRESSION: 1. Decreased, small left pleural effusion. 2. Slightly increased diffuse interstitial opacities with increased patchy airspace opacities in left lung. Findings could represent edema and/or multifocal edema. Electronically Signed   By: Margaretha Sheffield M.D.   On: 03/22/2022 14:32     ASSESSMENT/PLAN:  This is a very pleasant 87 year old female with: 1) history of stage Ia (T1b, N0, M0) ER positive, PR positive, and HER2 negative left-sided breast cancer.  She was diagnosed in August 2021.  She is status post left breast lumpectomy followed by radioactive seed localization and adjuvant radiotherapy.  She is currently on tamoxifen. 2) stage IV (T1c, N3, M1C) non-small cell lung cancer, adenocarcinoma.  She presented with a left lower lobe lung nodule in addition to bilateral mediastinal lymphadenopathy.  She also has bilateral pulmonary nodules and multiple metastatic bone lesions.  She was diagnosed in May 2023.  Her PD-L1 expression is 70%.  She is negative for any actionable mutations by Guardant360.  Foundation 1 showed she is positive for K-ras G12 C which can be targeted in the second line setting in the future.  She underwent palliative radiotherapy to several areas of metastatic bone disease including the hip and shoulder blade under the care of Dr. Lisbeth Renshaw   The patient is currently undergoing systemic chemotherapy with carboplatin for an AUC of 5, Alimta 500 mg per metered squared, Keytruda 200 mg IV every 3 weeks.  The patient is status post 5 cycles.  Starting from cycle #5, she started on maintenance Alimta and Keytruda IV every 3 weeks.  I reviewed her symptoms with Dr. Julien Nordmann. Based on her last x ray, he feels this is consistent with mild  pulmonary edema. She did not have significant improvement with lasix. I will arrange for a restaging  CT scan of the chest, abdomen, and pelvis prior to her next appointment. If worsening symptoms, advised to call back and we can try to get her scan sooner. I did offer a CXR today to assess the pulmonary edema to see if she needs additional lasix. She is going to try to use tessalon and robitussin. I have her a prescription for hycodoan if robitussin does not work. She knows not to take robitussin or tramadol with hycodan. She is going to take tylenol instead of tramadol.    Labs were reviewed.  Recommend that she proceed with cycle #6 today's schedule.  I will arrange for restaging CT scan of the chest, abdomen, pelvis prior to starting her next cycle of treatment.  We will see her back for follow-up visit in 3 weeks for evaluation and repeat blood work and to review her scan results before starting cycle #7.  Advised if no improvement in her cough and no evidence of disease progression on her imaging, we recommend follow up with Dr. Lamonte Sakai. She will continue to monitor her oxygen closely at home.    The patient was advised to call immediately if she has any concerning symptoms in the interval. The patient voices understanding of current disease status and treatment options and is in agreement with the current care plan. All questions were answered. The patient knows to call the clinic with any problems, questions or concerns. We can certainly see the patient much sooner if necessary        Orders Placed This Encounter  Procedures   CT Chest W Contrast    Standing Status:   Future    Standing Expiration Date:   04/12/2023    Order Specific Question:   If indicated for the ordered procedure, I authorize the administration of contrast media per Radiology protocol    Answer:   Yes    Order Specific Question:   Preferred imaging location?    Answer:   [facility]   CT Abdomen Pelvis  W Contrast    Standing Status:   Future    Standing Expiration Date:   04/12/2023    Order Specific Question:   If indicated for the ordered procedure, I authorize the administration of contrast media per Radiology protocol    Answer:   Yes    Order Specific Question:   Preferred imaging location?    Answer:   [facility]    Order Specific Question:   Is Oral Contrast requested for this exam?    Answer:   Yes, Per Radiology protocol      The total time spent in the appointment was 30-39 minutes.   Cassandra L Heilingoetter, PA-C 04/12/22

## 2022-04-11 ENCOUNTER — Other Ambulatory Visit: Payer: Self-pay | Admitting: Emergency Medicine

## 2022-04-12 ENCOUNTER — Inpatient Hospital Stay (HOSPITAL_BASED_OUTPATIENT_CLINIC_OR_DEPARTMENT_OTHER): Payer: Medicare HMO | Admitting: Physician Assistant

## 2022-04-12 ENCOUNTER — Ambulatory Visit (HOSPITAL_COMMUNITY)
Admission: RE | Admit: 2022-04-12 | Discharge: 2022-04-12 | Disposition: A | Discharge status: Home / Self Care | Payer: Medicare HMO | Source: Ambulatory Visit | Attending: Physician Assistant | Admitting: Physician Assistant

## 2022-04-12 ENCOUNTER — Inpatient Hospital Stay: Payer: Medicare HMO | Attending: Nurse Practitioner

## 2022-04-12 ENCOUNTER — Other Ambulatory Visit: Payer: Self-pay | Admitting: Medical Oncology

## 2022-04-12 ENCOUNTER — Inpatient Hospital Stay: Payer: Medicare HMO

## 2022-04-12 VITALS — BP 120/58 | HR 90 | Temp 98.0°F | Resp 18

## 2022-04-12 VITALS — BP 121/60 | HR 93 | Temp 97.6°F | Resp 18 | Ht 64.0 in | Wt 154.2 lb

## 2022-04-12 DIAGNOSIS — C7951 Secondary malignant neoplasm of bone: Secondary | ICD-10-CM | POA: Insufficient documentation

## 2022-04-12 DIAGNOSIS — Z5111 Encounter for antineoplastic chemotherapy: Secondary | ICD-10-CM | POA: Insufficient documentation

## 2022-04-12 DIAGNOSIS — Z8673 Personal history of transient ischemic attack (TIA), and cerebral infarction without residual deficits: Secondary | ICD-10-CM | POA: Diagnosis not present

## 2022-04-12 DIAGNOSIS — E785 Hyperlipidemia, unspecified: Secondary | ICD-10-CM | POA: Diagnosis not present

## 2022-04-12 DIAGNOSIS — Z7902 Long term (current) use of antithrombotics/antiplatelets: Secondary | ICD-10-CM | POA: Diagnosis not present

## 2022-04-12 DIAGNOSIS — C3432 Malignant neoplasm of lower lobe, left bronchus or lung: Secondary | ICD-10-CM | POA: Diagnosis not present

## 2022-04-12 DIAGNOSIS — C3491 Malignant neoplasm of unspecified part of right bronchus or lung: Secondary | ICD-10-CM

## 2022-04-12 DIAGNOSIS — R69 Illness, unspecified: Secondary | ICD-10-CM | POA: Diagnosis not present

## 2022-04-12 DIAGNOSIS — Z5112 Encounter for antineoplastic immunotherapy: Secondary | ICD-10-CM

## 2022-04-12 DIAGNOSIS — R059 Cough, unspecified: Secondary | ICD-10-CM | POA: Insufficient documentation

## 2022-04-12 DIAGNOSIS — J90 Pleural effusion, not elsewhere classified: Secondary | ICD-10-CM | POA: Insufficient documentation

## 2022-04-12 DIAGNOSIS — Z86010 Personal history of colonic polyps: Secondary | ICD-10-CM | POA: Insufficient documentation

## 2022-04-12 DIAGNOSIS — Z7982 Long term (current) use of aspirin: Secondary | ICD-10-CM | POA: Diagnosis not present

## 2022-04-12 DIAGNOSIS — I10 Essential (primary) hypertension: Secondary | ICD-10-CM | POA: Insufficient documentation

## 2022-04-12 DIAGNOSIS — J449 Chronic obstructive pulmonary disease, unspecified: Secondary | ICD-10-CM | POA: Diagnosis not present

## 2022-04-12 DIAGNOSIS — Z79899 Other long term (current) drug therapy: Secondary | ICD-10-CM | POA: Insufficient documentation

## 2022-04-12 DIAGNOSIS — F1011 Alcohol abuse, in remission: Secondary | ICD-10-CM | POA: Diagnosis not present

## 2022-04-12 DIAGNOSIS — Z79810 Long term (current) use of selective estrogen receptor modulators (SERMs): Secondary | ICD-10-CM | POA: Diagnosis not present

## 2022-04-12 DIAGNOSIS — C3490 Malignant neoplasm of unspecified part of unspecified bronchus or lung: Secondary | ICD-10-CM | POA: Diagnosis not present

## 2022-04-12 LAB — CBC WITH DIFFERENTIAL (CANCER CENTER ONLY)
Abs Immature Granulocytes: 0.02 10*3/uL (ref 0.00–0.07)
Basophils Absolute: 0.1 10*3/uL (ref 0.0–0.1)
Basophils Relative: 1 %
Eosinophils Absolute: 1.4 10*3/uL — ABNORMAL HIGH (ref 0.0–0.5)
Eosinophils Relative: 16 %
HCT: 30.9 % — ABNORMAL LOW (ref 36.0–46.0)
Hemoglobin: 10.1 g/dL — ABNORMAL LOW (ref 12.0–15.0)
Immature Granulocytes: 0 %
Lymphocytes Relative: 3 %
Lymphs Abs: 0.3 10*3/uL — ABNORMAL LOW (ref 0.7–4.0)
MCH: 34.7 pg — ABNORMAL HIGH (ref 26.0–34.0)
MCHC: 32.7 g/dL (ref 30.0–36.0)
MCV: 106.2 fL — ABNORMAL HIGH (ref 80.0–100.0)
Monocytes Absolute: 0.9 10*3/uL (ref 0.1–1.0)
Monocytes Relative: 10 %
Neutro Abs: 6.3 10*3/uL (ref 1.7–7.7)
Neutrophils Relative %: 70 %
Platelet Count: 252 10*3/uL (ref 150–400)
RBC: 2.91 MIL/uL — ABNORMAL LOW (ref 3.87–5.11)
RDW: 16.4 % — ABNORMAL HIGH (ref 11.5–15.5)
WBC Count: 8.9 10*3/uL (ref 4.0–10.5)
nRBC: 0 % (ref 0.0–0.2)

## 2022-04-12 LAB — CMP (CANCER CENTER ONLY)
ALT: 7 U/L (ref 0–44)
AST: 18 U/L (ref 15–41)
Albumin: 3.1 g/dL — ABNORMAL LOW (ref 3.5–5.0)
Alkaline Phosphatase: 38 U/L (ref 38–126)
Anion gap: 6 (ref 5–15)
BUN: 13 mg/dL (ref 8–23)
CO2: 30 mmol/L (ref 22–32)
Calcium: 9 mg/dL (ref 8.9–10.3)
Chloride: 103 mmol/L (ref 98–111)
Creatinine: 0.69 mg/dL (ref 0.44–1.00)
GFR, Estimated: >60 mL/min (ref >60)
Glucose, Bld: 159 mg/dL — ABNORMAL HIGH (ref 70–99)
Potassium: 3.7 mmol/L (ref 3.5–5.1)
Sodium: 139 mmol/L (ref 135–145)
Total Bilirubin: 0.4 mg/dL (ref 0.3–1.2)
Total Protein: 6.4 g/dL — ABNORMAL LOW (ref 6.5–8.1)

## 2022-04-12 LAB — TSH: TSH: 2.573 u[IU]/mL (ref 0.350–4.500)

## 2022-04-12 MED ORDER — SODIUM CHLORIDE 0.9 % IV SOLN: Freq: Once | INTRAVENOUS | Status: AC

## 2022-04-12 MED ORDER — SODIUM CHLORIDE 0.9 % IV SOLN
500.0000 mg/m2 | Freq: Once | INTRAVENOUS | Status: AC
  Administered 2022-04-12: 900 mg via INTRAVENOUS
  Filled 2022-04-12: qty 20

## 2022-04-12 MED ORDER — CYANOCOBALAMIN 1000 MCG/ML IJ SOLN
1000.0000 ug | Freq: Once | INTRAMUSCULAR | Status: AC
  Administered 2022-04-12: 1000 ug via INTRAMUSCULAR
  Filled 2022-04-12: qty 1

## 2022-04-12 MED ORDER — PROCHLORPERAZINE MALEATE 10 MG PO TABS
10.0000 mg | ORAL_TABLET | Freq: Once | ORAL | Status: AC
  Administered 2022-04-12: 10 mg via ORAL
  Filled 2022-04-12: qty 1

## 2022-04-12 MED ORDER — HYDROCODONE BIT-HOMATROP MBR 5-1.5 MG/5ML PO SOLN: 5.0000 mL | Freq: Four times a day (QID) | ORAL | 0 refills | Status: DC | PRN

## 2022-04-12 MED ORDER — SODIUM CHLORIDE 0.9 % IV SOLN
200.0000 mg | Freq: Once | INTRAVENOUS | Status: AC
  Administered 2022-04-12: 200 mg via INTRAVENOUS
  Filled 2022-04-12: qty 200

## 2022-04-12 NOTE — Patient Instructions (Signed)
[facility] ONCOLOGY  Discharge Instructions: Thank you for choosing [facility] to provide your oncology and hematology care.   If you have a lab appointment with the [facility], please go directly to the [facility] and check in at the registration area.   Wear comfortable clothing and clothing appropriate for easy access to any Portacath or PICC line.   We strive to give you quality time with your provider. You may need to reschedule your appointment if you arrive late (15 or more minutes).  Arriving late affects you and other patients whose appointments are after yours.  Also, if you miss three or more appointments without notifying the office, you may be dismissed from the clinic at the provider's discretion.      For prescription refill requests, have your pharmacy contact our office and allow 72 hours for refills to be completed.    Today you received the following chemotherapy and/or immunotherapy agents: Keytruda, alimta, and carboplatin      To help prevent nausea and vomiting after your treatment, we encourage you to take your nausea medication as directed.  BELOW ARE SYMPTOMS THAT SHOULD BE REPORTED IMMEDIATELY: *FEVER GREATER THAN 100.4 F (38 C) OR HIGHER *CHILLS OR SWEATING *NAUSEA AND VOMITING THAT IS NOT CONTROLLED WITH YOUR NAUSEA MEDICATION *UNUSUAL SHORTNESS OF BREATH *UNUSUAL BRUISING OR BLEEDING *URINARY PROBLEMS (pain or burning when urinating, or frequent urination) *BOWEL PROBLEMS (unusual diarrhea, constipation, pain near the anus) TENDERNESS IN MOUTH AND THROAT WITH OR WITHOUT PRESENCE OF ULCERS (sore throat, sores in mouth, or a toothache) UNUSUAL RASH, SWELLING OR PAIN  UNUSUAL VAGINAL DISCHARGE OR ITCHING   Items with * indicate a potential emergency and should be followed up as soon as possible or go to the Emergency Department if any problems should occur.  Please show the CHEMOTHERAPY ALERT CARD or IMMUNOTHERAPY  ALERT CARD at check-in to the Emergency Department and triage nurse.  Should you have questions after your visit or need to cancel or reschedule your appointment, please contact [facility]  Dept: [phone]  and follow the prompts.  Office hours are 8:00 a.m. to 4:30 p.m. Monday - Friday. Please note that voicemails left after 4:00 p.m. may not be returned until the following business day.  We are closed weekends and major holidays. You have access to a nurse at all times for urgent questions. Please call the main number to the clinic Dept: [phone] and follow the prompts.   For any non-urgent questions, you may also contact your provider using MyChart. We now offer e-Visits for anyone 5 and older to request care online for non-urgent symptoms. For details visit [email or web address].   Also download the MyChart app! Go to the app store, search "MyChart", open the app, select [facility], and log in with your MyChart username and password.  Masks are optional in the cancer centers. If you would like for your care team to wear a mask while they are taking care of you, please let them know. You may have one support person who is at least 85 years old accompany you for your appointments.

## 2022-04-14 ENCOUNTER — Other Ambulatory Visit: Payer: Self-pay | Admitting: Medical Oncology

## 2022-04-14 ENCOUNTER — Telehealth: Payer: Self-pay | Admitting: Medical Oncology

## 2022-04-14 DIAGNOSIS — R609 Edema, unspecified: Secondary | ICD-10-CM

## 2022-04-14 LAB — T4: T4, Total: 11.3 ug/dL (ref 4.5–12.0)

## 2022-04-14 MED ORDER — BEVESPI AEROSPHERE 9-4.8 MCG/ACT IN AERO: 2.0000 | INHALATION_SPRAY | Freq: Two times a day (BID) | RESPIRATORY_TRACT | 1 refills | Status: DC

## 2022-04-14 MED ORDER — FUROSEMIDE 20 MG PO TABS: ORAL_TABLET | ORAL | 0 refills | Status: DC

## 2022-04-14 NOTE — Telephone Encounter (Signed)
Pt notified that Cassie ordered Lasix 20 mg to take prn #5 and to take potassium tablets with lasix. Pt notified that her. scan will be moved up[

## 2022-04-15 ENCOUNTER — Inpatient Hospital Stay (HOSPITAL_COMMUNITY)
Admission: EM | Admit: 2022-04-15 | Discharge: 2022-04-18 | DRG: 193 | Disposition: A | Discharge status: Home / Self Care | Payer: Medicare HMO | Attending: Internal Medicine | Admitting: Internal Medicine

## 2022-04-15 ENCOUNTER — Other Ambulatory Visit: Payer: Self-pay

## 2022-04-15 ENCOUNTER — Encounter (HOSPITAL_COMMUNITY): Payer: Self-pay | Admitting: Emergency Medicine

## 2022-04-15 ENCOUNTER — Emergency Department (HOSPITAL_COMMUNITY): Payer: Medicare HMO

## 2022-04-15 DIAGNOSIS — M2041 Other hammer toe(s) (acquired), right foot: Secondary | ICD-10-CM | POA: Insufficient documentation

## 2022-04-15 DIAGNOSIS — F32A Depression, unspecified: Secondary | ICD-10-CM | POA: Diagnosis present

## 2022-04-15 DIAGNOSIS — D509 Iron deficiency anemia, unspecified: Secondary | ICD-10-CM | POA: Diagnosis present

## 2022-04-15 DIAGNOSIS — R0902 Hypoxemia: Secondary | ICD-10-CM | POA: Diagnosis not present

## 2022-04-15 DIAGNOSIS — J441 Chronic obstructive pulmonary disease with (acute) exacerbation: Secondary | ICD-10-CM | POA: Diagnosis not present

## 2022-04-15 DIAGNOSIS — R7309 Other abnormal glucose: Secondary | ICD-10-CM | POA: Diagnosis present

## 2022-04-15 DIAGNOSIS — I959 Hypotension, unspecified: Secondary | ICD-10-CM | POA: Diagnosis not present

## 2022-04-15 DIAGNOSIS — Z7982 Long term (current) use of aspirin: Secondary | ICD-10-CM

## 2022-04-15 DIAGNOSIS — R739 Hyperglycemia, unspecified: Secondary | ICD-10-CM | POA: Diagnosis present

## 2022-04-15 DIAGNOSIS — I10 Essential (primary) hypertension: Secondary | ICD-10-CM | POA: Diagnosis not present

## 2022-04-15 DIAGNOSIS — F1021 Alcohol dependence, in remission: Secondary | ICD-10-CM | POA: Diagnosis present

## 2022-04-15 DIAGNOSIS — J189 Pneumonia, unspecified organism: Secondary | ICD-10-CM | POA: Diagnosis not present

## 2022-04-15 DIAGNOSIS — R062 Wheezing: Secondary | ICD-10-CM | POA: Diagnosis not present

## 2022-04-15 DIAGNOSIS — M159 Polyosteoarthritis, unspecified: Secondary | ICD-10-CM | POA: Diagnosis present

## 2022-04-15 DIAGNOSIS — J9621 Acute and chronic respiratory failure with hypoxia: Secondary | ICD-10-CM | POA: Diagnosis not present

## 2022-04-15 DIAGNOSIS — Z9981 Dependence on supplemental oxygen: Secondary | ICD-10-CM

## 2022-04-15 DIAGNOSIS — J309 Allergic rhinitis, unspecified: Secondary | ICD-10-CM | POA: Diagnosis present

## 2022-04-15 DIAGNOSIS — Z1152 Encounter for screening for COVID-19: Secondary | ICD-10-CM

## 2022-04-15 DIAGNOSIS — Z87891 Personal history of nicotine dependence: Secondary | ICD-10-CM

## 2022-04-15 DIAGNOSIS — I350 Nonrheumatic aortic (valve) stenosis: Secondary | ICD-10-CM

## 2022-04-15 DIAGNOSIS — F419 Anxiety disorder, unspecified: Secondary | ICD-10-CM | POA: Diagnosis present

## 2022-04-15 DIAGNOSIS — I700 Atherosclerosis of aorta: Secondary | ICD-10-CM | POA: Diagnosis present

## 2022-04-15 DIAGNOSIS — Z79899 Other long term (current) drug therapy: Secondary | ICD-10-CM

## 2022-04-15 DIAGNOSIS — J440 Chronic obstructive pulmonary disease with acute lower respiratory infection: Secondary | ICD-10-CM | POA: Diagnosis present

## 2022-04-15 DIAGNOSIS — K5790 Diverticulosis of intestine, part unspecified, without perforation or abscess without bleeding: Secondary | ICD-10-CM | POA: Diagnosis present

## 2022-04-15 DIAGNOSIS — Z7902 Long term (current) use of antithrombotics/antiplatelets: Secondary | ICD-10-CM

## 2022-04-15 DIAGNOSIS — C50912 Malignant neoplasm of unspecified site of left female breast: Secondary | ICD-10-CM | POA: Diagnosis present

## 2022-04-15 DIAGNOSIS — I739 Peripheral vascular disease, unspecified: Secondary | ICD-10-CM | POA: Diagnosis present

## 2022-04-15 DIAGNOSIS — Z8249 Family history of ischemic heart disease and other diseases of the circulatory system: Secondary | ICD-10-CM

## 2022-04-15 DIAGNOSIS — C3491 Malignant neoplasm of unspecified part of right bronchus or lung: Secondary | ICD-10-CM | POA: Diagnosis present

## 2022-04-15 DIAGNOSIS — E785 Hyperlipidemia, unspecified: Secondary | ICD-10-CM | POA: Diagnosis present

## 2022-04-15 DIAGNOSIS — K219 Gastro-esophageal reflux disease without esophagitis: Secondary | ICD-10-CM | POA: Diagnosis present

## 2022-04-15 DIAGNOSIS — R918 Other nonspecific abnormal finding of lung field: Secondary | ICD-10-CM | POA: Diagnosis present

## 2022-04-15 DIAGNOSIS — Z743 Need for continuous supervision: Secondary | ICD-10-CM | POA: Diagnosis not present

## 2022-04-15 DIAGNOSIS — M2042 Other hammer toe(s) (acquired), left foot: Secondary | ICD-10-CM | POA: Insufficient documentation

## 2022-04-15 DIAGNOSIS — Z8673 Personal history of transient ischemic attack (TIA), and cerebral infarction without residual deficits: Secondary | ICD-10-CM

## 2022-04-15 DIAGNOSIS — Z8701 Personal history of pneumonia (recurrent): Secondary | ICD-10-CM

## 2022-04-15 DIAGNOSIS — Z888 Allergy status to other drugs, medicaments and biological substances status: Secondary | ICD-10-CM

## 2022-04-15 DIAGNOSIS — R053 Chronic cough: Secondary | ICD-10-CM | POA: Diagnosis present

## 2022-04-15 DIAGNOSIS — Z885 Allergy status to narcotic agent status: Secondary | ICD-10-CM

## 2022-04-15 DIAGNOSIS — R0602 Shortness of breath: Secondary | ICD-10-CM | POA: Diagnosis not present

## 2022-04-15 DIAGNOSIS — Z8679 Personal history of other diseases of the circulatory system: Secondary | ICD-10-CM

## 2022-04-15 DIAGNOSIS — Z91048 Other nonmedicinal substance allergy status: Secondary | ICD-10-CM

## 2022-04-15 DIAGNOSIS — Z90710 Acquired absence of both cervix and uterus: Secondary | ICD-10-CM

## 2022-04-15 DIAGNOSIS — Z91011 Allergy to milk products: Secondary | ICD-10-CM

## 2022-04-15 LAB — COMPREHENSIVE METABOLIC PANEL
ALT: 10 U/L (ref 0–44)
AST: 25 U/L (ref 15–41)
Albumin: 2.8 g/dL — ABNORMAL LOW (ref 3.5–5.0)
Alkaline Phosphatase: 39 U/L (ref 38–126)
Anion gap: 5 (ref 5–15)
BUN: 13 mg/dL (ref 8–23)
CO2: 29 mmol/L (ref 22–32)
Calcium: 9.1 mg/dL (ref 8.9–10.3)
Chloride: 104 mmol/L (ref 98–111)
Creatinine, Ser: 0.68 mg/dL (ref 0.44–1.00)
GFR, Estimated: >60 mL/min (ref >60)
Glucose, Bld: 131 mg/dL — ABNORMAL HIGH (ref 70–99)
Potassium: 4.1 mmol/L (ref 3.5–5.1)
Sodium: 138 mmol/L (ref 135–145)
Total Bilirubin: 0.8 mg/dL (ref 0.3–1.2)
Total Protein: 6.8 g/dL (ref 6.5–8.1)

## 2022-04-15 LAB — CBC WITH DIFFERENTIAL/PLATELET
Abs Immature Granulocytes: 0.03 10*3/uL (ref 0.00–0.07)
Basophils Absolute: 0 10*3/uL (ref 0.0–0.1)
Basophils Relative: 1 %
Eosinophils Absolute: 0.2 10*3/uL (ref 0.0–0.5)
Eosinophils Relative: 2 %
HCT: 29.3 % — ABNORMAL LOW (ref 36.0–46.0)
Hemoglobin: 9.4 g/dL — ABNORMAL LOW (ref 12.0–15.0)
Immature Granulocytes: 0 %
Lymphocytes Relative: 2 %
Lymphs Abs: 0.1 10*3/uL — ABNORMAL LOW (ref 0.7–4.0)
MCH: 34.9 pg — ABNORMAL HIGH (ref 26.0–34.0)
MCHC: 32.1 g/dL (ref 30.0–36.0)
MCV: 108.9 fL — ABNORMAL HIGH (ref 80.0–100.0)
Monocytes Absolute: 0.2 10*3/uL (ref 0.1–1.0)
Monocytes Relative: 2 %
Neutro Abs: 6.9 10*3/uL (ref 1.7–7.7)
Neutrophils Relative %: 93 %
Platelets: 177 10*3/uL (ref 150–400)
RBC: 2.69 MIL/uL — ABNORMAL LOW (ref 3.87–5.11)
RDW: 15.7 % — ABNORMAL HIGH (ref 11.5–15.5)
WBC: 7.4 10*3/uL (ref 4.0–10.5)
nRBC: 0 % (ref 0.0–0.2)

## 2022-04-15 LAB — SARS CORONAVIRUS 2 BY RT PCR: SARS Coronavirus 2 by RT PCR: NEGATIVE

## 2022-04-15 LAB — LACTIC ACID, PLASMA
Lactic Acid, Venous: 1.2 mmol/L (ref 0.5–1.9)
Lactic Acid, Venous: 1.1 mmol/L (ref 0.5–1.9)

## 2022-04-15 LAB — PHOSPHORUS: Phosphorus: 3.9 mg/dL (ref 2.5–4.6)

## 2022-04-15 LAB — PROCALCITONIN: Procalcitonin: <0.1 ng/mL

## 2022-04-15 MED ORDER — ONDANSETRON HCL 4 MG/2ML IJ SOLN: 4.0000 mg | Freq: Four times a day (QID) | INTRAMUSCULAR | Status: DC | PRN

## 2022-04-15 MED ORDER — LORATADINE 10 MG PO TABS
10.0000 mg | ORAL_TABLET | Freq: Every day | ORAL | Status: DC
  Administered 2022-04-16 – 2022-04-18 (×3): 10 mg via ORAL
  Filled 2022-04-15 (×3): qty 1

## 2022-04-15 MED ORDER — SODIUM CHLORIDE 0.9 % IV SOLN
1.0000 g | INTRAVENOUS | Status: DC
  Administered 2022-04-16 – 2022-04-17 (×2): 1 g via INTRAVENOUS
  Filled 2022-04-15 (×3): qty 10

## 2022-04-15 MED ORDER — ACETAMINOPHEN 650 MG RE SUPP: 650.0000 mg | Freq: Four times a day (QID) | RECTAL | Status: DC | PRN

## 2022-04-15 MED ORDER — MAGNESIUM SULFATE 2 GM/50ML IV SOLN
2.0000 g | Freq: Once | INTRAVENOUS | Status: AC
  Administered 2022-04-15: 2 g via INTRAVENOUS
  Filled 2022-04-15: qty 50

## 2022-04-15 MED ORDER — ONDANSETRON HCL 4 MG PO TABS: 4.0000 mg | ORAL_TABLET | Freq: Four times a day (QID) | ORAL | Status: DC | PRN

## 2022-04-15 MED ORDER — PREDNISONE 20 MG PO TABS
20.0000 mg | ORAL_TABLET | Freq: Every day | ORAL | Status: DC
  Administered 2022-04-16 – 2022-04-18 (×3): 20 mg via ORAL
  Filled 2022-04-15 (×3): qty 1

## 2022-04-15 MED ORDER — SODIUM CHLORIDE 0.9 % IV SOLN
500.0000 mg | INTRAVENOUS | Status: DC
  Administered 2022-04-16 – 2022-04-17 (×2): 500 mg via INTRAVENOUS
  Filled 2022-04-15 (×2): qty 5

## 2022-04-15 MED ORDER — ALBUTEROL SULFATE (2.5 MG/3ML) 0.083% IN NEBU
2.5000 mg | INHALATION_SOLUTION | Freq: Once | RESPIRATORY_TRACT | Status: AC
  Administered 2022-04-15: 2.5 mg via RESPIRATORY_TRACT
  Filled 2022-04-15: qty 3

## 2022-04-15 MED ORDER — GUAIFENESIN ER 600 MG PO TB12
600.0000 mg | ORAL_TABLET | Freq: Two times a day (BID) | ORAL | Status: DC
  Administered 2022-04-15 – 2022-04-18 (×6): 600 mg via ORAL
  Filled 2022-04-15 (×6): qty 1

## 2022-04-15 MED ORDER — SODIUM CHLORIDE 0.9 % IV SOLN
500.0000 mg | Freq: Once | INTRAVENOUS | Status: AC
  Administered 2022-04-15: 500 mg via INTRAVENOUS
  Filled 2022-04-15: qty 5

## 2022-04-15 MED ORDER — PANTOPRAZOLE SODIUM 40 MG PO TBEC
40.0000 mg | DELAYED_RELEASE_TABLET | Freq: Every day | ORAL | Status: DC
  Administered 2022-04-16 – 2022-04-18 (×3): 40 mg via ORAL
  Filled 2022-04-15 (×3): qty 1

## 2022-04-15 MED ORDER — ALBUTEROL SULFATE (2.5 MG/3ML) 0.083% IN NEBU
2.5000 mg | INHALATION_SOLUTION | RESPIRATORY_TRACT | Status: DC | PRN
  Administered 2022-04-16: 2.5 mg via RESPIRATORY_TRACT
  Filled 2022-04-15: qty 3

## 2022-04-15 MED ORDER — SODIUM CHLORIDE 0.9 % IV SOLN
2.0000 g | Freq: Once | INTRAVENOUS | Status: AC
  Administered 2022-04-15: 2 g via INTRAVENOUS
  Filled 2022-04-15: qty 20

## 2022-04-15 MED ORDER — IPRATROPIUM-ALBUTEROL 0.5-2.5 (3) MG/3ML IN SOLN
3.0000 mL | Freq: Four times a day (QID) | RESPIRATORY_TRACT | Status: DC
  Administered 2022-04-15 – 2022-04-17 (×6): 3 mL via RESPIRATORY_TRACT
  Filled 2022-04-15 (×6): qty 3

## 2022-04-15 MED ORDER — IPRATROPIUM-ALBUTEROL 0.5-2.5 (3) MG/3ML IN SOLN
3.0000 mL | Freq: Once | RESPIRATORY_TRACT | Status: AC
  Administered 2022-04-15: 3 mL via RESPIRATORY_TRACT
  Filled 2022-04-15: qty 3

## 2022-04-15 MED ORDER — HYDROCODONE BIT-HOMATROP MBR 5-1.5 MG/5ML PO SOLN: 5.0000 mL | Freq: Four times a day (QID) | ORAL | Status: DC | PRN

## 2022-04-15 MED ORDER — SODIUM CHLORIDE 0.9 % IV BOLUS: 1000.0000 mL | Freq: Once | INTRAVENOUS | Status: DC

## 2022-04-15 MED ORDER — METHYLPREDNISOLONE SODIUM SUCC 40 MG IJ SOLR
40.0000 mg | Freq: Once | INTRAMUSCULAR | Status: AC
  Administered 2022-04-15: 40 mg via INTRAVENOUS
  Filled 2022-04-15: qty 1

## 2022-04-15 MED ORDER — ACETAMINOPHEN 325 MG PO TABS
650.0000 mg | ORAL_TABLET | Freq: Four times a day (QID) | ORAL | Status: DC | PRN
  Administered 2022-04-15: 650 mg via ORAL
  Filled 2022-04-15: qty 2

## 2022-04-15 MED ORDER — MONTELUKAST SODIUM 10 MG PO TABS
10.0000 mg | ORAL_TABLET | Freq: Every day | ORAL | Status: DC
  Administered 2022-04-15 – 2022-04-17 (×3): 10 mg via ORAL
  Filled 2022-04-15 (×4): qty 1

## 2022-04-15 NOTE — ED Provider Notes (Signed)
[facility] DEPT Provider Note   CSN: [id number] Arrival date & time: 04/15/22  1512     History {Add pertinent medical, surgical, social history, OB history to HPI:1} Chief Complaint  Patient presents with   Shortness of Breath    Sheila Shields is a 87 y.o. female.  Patient with breast cancer and lung cancer and COPD.  She is getting chemotherapy and immunotherapy for the lung cancer.  She has become short of breath with increased coughing recently.  The history is provided by the patient and medical records. No language interpreter was used.  Shortness of Breath Severity:  Moderate Onset quality:  Sudden Timing:  Constant Progression:  Worsening Chronicity:  Recurrent Context: activity   Relieved by:  Nothing Worsened by:  Nothing Ineffective treatments:  None tried Associated symptoms: no abdominal pain, no chest pain, no cough, no headaches and no rash        Home Medications Prior to Admission medications   Medication Sig Start Date End Date Taking? Authorizing Provider  albuterol (PROAIR HFA) 108 (90 Base) MCG/ACT inhaler INHALE 2 PUFFS EVERY 4 HOURS AS NEEDED FOR COUGHING SPELLS 05/24/20   Marin Olp, MD  aspirin 325 MG tablet Take 1 tablet (325 mg total) by mouth daily. 01/01/19   Louk, Bea Graff, PA-C  azelastine (ASTELIN) 0.1 % nasal spray Place 2 sprays into both nostrils 2 (two) times daily. Use in each nostril as directed 10/28/21   Cobb, Karie Schwalbe, NP  benzonatate (TESSALON) 200 MG capsule Take 1 capsule (200 mg total) by mouth 3 (three) times daily as needed for cough. 02/21/22   Marin Olp, MD  Cholecalciferol (VITAMIN D) 50 MCG (2000 UT) tablet Take 2,000 Units by mouth daily.    [provider]  clopidogrel (PLAVIX) 75 MG tablet Take 0.5 tablets (37.5 mg total) by mouth daily. 08/03/21   Marin Olp, MD  Coenzyme Q10 300 MG CAPS Take 300 mg by mouth daily.    [provider]   fexofenadine (ALLEGRA) 180 MG tablet Take 180 mg by mouth daily.    [provider]  fluticasone (FLONASE) 50 MCG/ACT nasal spray Place 2 sprays into both nostrils daily. 09/24/18   Collene Gobble, MD  folic acid (FOLVITE) 1 MG tablet TAKE 1 TABLET(1 MG) BY MOUTH DAILY 03/15/22   Curt Bears, MD  furosemide (LASIX) 20 MG tablet 1 tablet p.o. daily only as needed for swelling or worsening shortness of breath. 04/14/22   Heilingoetter, Cassandra L, PA-C  Glycopyrrolate-Formoterol (BEVESPI AEROSPHERE) 9-4.8 MCG/ACT AERO Inhale 2 puffs into the lungs 2 (two) times daily. 04/14/22   Collene Gobble, MD  hydrochlorothiazide (HYDRODIURIL) 25 MG tablet Take 12.5 mg by mouth every morning. 02/07/22   [provider]  HYDROcodone bit-homatropine (HYCODAN) 5-1.5 MG/5ML syrup Take 5 mLs by mouth every 6 (six) hours as needed for cough. 04/12/22   Heilingoetter, Cassandra L, PA-C  montelukast (SINGULAIR) 10 MG tablet Take 1 tablet (10 mg total) by mouth at bedtime. 10/28/21   Cobb, Karie Schwalbe, NP  Multiple Vitamin (MULTIVITAMIN WITH MINERALS) TABS tablet Take 1 tablet by mouth daily.    [provider]  pantoprazole (PROTONIX) 40 MG tablet Take 1 tablet (40 mg total) by mouth daily. 10/28/21   Cobb, Karie Schwalbe, NP  potassium chloride SA (KLOR-CON M) 20 MEQ tablet Take 20 mEq by mouth every other day. 04/01/22   [provider]  prochlorperazine (COMPAZINE) 10 MG tablet Take 1  tablet (10 mg total) by mouth every 6 (six) hours as needed. Patient not taking: Reported on 02/08/2022 12/21/21   Heilingoetter, Cassandra L, PA-C  Propylene Glycol (SYSTANE BALANCE OP) Place 1 drop into both eyes daily.    [provider]  rosuvastatin (CRESTOR) 20 MG tablet Take 1 tablet (20 mg total) by mouth daily. 08/03/21   Marin Olp, MD  tamoxifen (NOLVADEX) 20 MG tablet Take 1 tablet (20 mg total) by mouth daily. 09/12/21   Alla Feeling, NP  traMADol (ULTRAM) 50 MG tablet TAKE 1  TABLET BY MOUTH EVERY 8 HOURS FOR UP TO 5 DAYS AS NEEDED FOR PAIN. DO NOT DRIVE FOR 8 HOURS AFTER TAKING 03/08/22   Marin Olp, MD  traZODone (DESYREL) 50 MG tablet TAKE 1/2- 1 TABLET BY MOUTH EVERY NIGHT AT BEDTIME AS NEEDED SLEEP 01/18/22   Marin Olp, MD  vitamin B-12 (CYANOCOBALAMIN) 1000 MCG tablet Take 1,000 mcg by mouth daily.    [provider]      Allergies    Alcohol, Cheese, Dilaudid [hydromorphone hcl], and Phenytoin sodium extended    Review of Systems   Review of Systems  Constitutional:  Negative for appetite change and fatigue.  HENT:  Negative for congestion, ear discharge and sinus pressure.   Eyes:  Negative for discharge.  Respiratory:  Positive for shortness of breath. Negative for cough.   Cardiovascular:  Negative for chest pain.  Gastrointestinal:  Negative for abdominal pain and diarrhea.  Genitourinary:  Negative for frequency and hematuria.  Musculoskeletal:  Negative for back pain.  Skin:  Negative for rash.  Neurological:  Negative for seizures and headaches.  Psychiatric/Behavioral:  Negative for hallucinations.     Physical Exam Updated Vital Signs BP (!) 92/51   Pulse 91   Temp 97.8 F (36.6 C) (Oral)   Resp (!) 21   Ht 5\' 5"  (1.651 m)   Wt 69.9 kg   SpO2 90%   BMI 25.63 kg/m  Physical Exam Vitals and nursing note reviewed.  Constitutional:      Appearance: She is well-developed.  HENT:     Head: Normocephalic.     Nose: Nose normal.  Eyes:     General: No scleral icterus.    Conjunctiva/sclera: Conjunctivae normal.  Neck:     Thyroid: No thyromegaly.  Cardiovascular:     Rate and Rhythm: Normal rate and regular rhythm.     Heart sounds: No murmur heard.    No friction rub. No gallop.  Pulmonary:     Breath sounds: No stridor. Wheezing present. No rales.  Chest:     Chest wall: No tenderness.  Abdominal:     General: There is no distension.     Tenderness: There is no abdominal tenderness. There is no  rebound.  Musculoskeletal:        General: Normal range of motion.     Cervical back: Neck supple.  Lymphadenopathy:     Cervical: No cervical adenopathy.  Skin:    Findings: No erythema or rash.  Neurological:     Mental Status: She is alert and oriented to person, place, and time.     Motor: No abnormal muscle tone.     Coordination: Coordination normal.  Psychiatric:        Behavior: Behavior normal.     ED Results / Procedures / Treatments   Labs (all labs ordered are listed, but only abnormal results are displayed) Labs Reviewed  CBC WITH DIFFERENTIAL/PLATELET -  Abnormal; Notable for the following components:      Result Value   RBC 2.69 (*)    Hemoglobin 9.4 (*)    HCT 29.3 (*)    MCV 108.9 (*)    MCH 34.9 (*)    RDW 15.7 (*)    Lymphs Abs 0.1 (*)    All other components within normal limits  COMPREHENSIVE METABOLIC PANEL - Abnormal; Notable for the following components:   Glucose, Bld 131 (*)    Albumin 2.8 (*)    All other components within normal limits  CULTURE, BLOOD (ROUTINE X 2)  CULTURE, BLOOD (ROUTINE X 2)  SARS CORONAVIRUS 2 BY RT PCR  LACTIC ACID, PLASMA  LACTIC ACID, PLASMA    EKG None  Radiology DG Chest Port 1 View  Result Date: 04/15/2022 CLINICAL DATA:  Shortness of breath EXAM: PORTABLE CHEST 1 VIEW COMPARISON:  Previous studies including the examination of 04/12/2022 FINDINGS: Cardiac size is within normal limits. Thoracic aorta is tortuous and ectatic. Increased interstitial markings are seen in both lungs, more so on the left side. There is haziness in the left lower lung fields, possibly due to layering pleural effusion. There is interval improvement in aeration in right lower lung field. There are no new focal infiltrates. IMPRESSION: Increased interstitial markings are seen in the periphery of both lungs, possibly suggesting scarring. Haziness in left mid and left lower lung fields may be due to layering pleural effusion and underlying  atelectasis/pneumonia. There is interval improvement in aeration of right lower lung fields suggesting resolving interstitial edema. Electronically Signed   By: Elmer Picker M.D.   On: 04/15/2022 15:58    Procedures Procedures  {Document cardiac monitor, telemetry assessment procedure when appropriate:1}  Medications Ordered in ED Medications  magnesium sulfate IVPB 2 g 50 mL (2 g Intravenous New Bag/Given 04/15/22 1640)  sodium chloride 0.9 % bolus 1,000 mL (has no administration in time range)  cefTRIAXone (ROCEPHIN) 2 g in sodium chloride 0.9 % 100 mL IVPB (has no administration in time range)  azithromycin (ZITHROMAX) 500 mg in sodium chloride 0.9 % 250 mL IVPB (has no administration in time range)  ipratropium-albuterol (DUONEB) 0.5-2.5 (3) MG/3ML nebulizer solution 3 mL (3 mLs Nebulization Given 04/15/22 1632)  albuterol (PROVENTIL) (2.5 MG/3ML) 0.083% nebulizer solution 2.5 mg (2.5 mg Nebulization Given 04/15/22 1632)    ED Course/ Medical Decision Making/ A&P  Patient given neb treatments which seem to help her shortness of breath.                         Medical Decision Making Amount and/or Complexity of Data Reviewed Labs: ordered. Radiology: ordered.  Risk Prescription drug management. Decision regarding hospitalization.   Patient with COPD exacerbation and possible pneumonia with a history of lung cancer and breast cancer.  She will be admitted to medicine  {Document critical care time when appropriate:1} {Document review of labs and clinical decision tools ie heart score, Chads2Vasc2 etc:1}  {Document your independent review of radiology images, and any outside records:1} {Document your discussion with family members, caretakers, and with consultants:1} {Document social determinants of health affecting pt's care:1} {Document your decision making why or why not admission, treatments were needed:1} Final Clinical Impression(s) / ED Diagnoses Final diagnoses:   COPD exacerbation ([place])    Rx / DC Orders ED Discharge Orders     None

## 2022-04-15 NOTE — ED Notes (Signed)
Pt with poor venous access. Second blood culture unable to obtain adequate volume. Sent as is in one anaerobic vial.

## 2022-04-15 NOTE — ED Triage Notes (Addendum)
Per EMS, patient from independent living at [facility], c/o SOB today. States feeling worse since chemo infusion x4 days ago. Reports got Covid and flu vaccines yesterday. Hx stage 4 lung cancer and COPD.   3L [place] at baseline  74% on 4L with EMS arrival  5mg  albuterol with EMS with relief  BP 110/50 HR 100 93% 6L  with EMS CBG 170  Patient also c/o body aches and cough.

## 2022-04-15 NOTE — H&P (Signed)
History and Physical    Patient: Sheila Shields SNK:[id number] DOB: 10-16-1934 DOA: 04/15/2022 DOS: the patient was seen and examined on 04/15/2022 PCP: Marin Olp, MD  Patient coming from: ALF/ILF  Chief Complaint:  Chief Complaint  Patient presents with   Shortness of Breath   HPI: Sheila Shields is a 87 y.o. female with medical history significant of bilateral acquired hammertoes, anxiety/depression, alcoholism in remission, stage IV adenocarcinoma of the right lung, allergic rhinitis, aortic atherosclerosis, osteoarthritis, history of left breast cancer, brain aneurysm, chronic respiratory failure on home O2 at 3 LPM, COPD, chronic cough, GERD, hypertension, hyperglycemia, hyperlipidemia, insomnia, iron deficiency anemia, mild aortic stenosis, grade 1 diastolic dysfunction, osteoarthritis of multiple sites, peripheral vascular disease, history of multiple pulmonary nodules, history of pneumonia as a child, TIA, rectal polyp who is being brought from her independent living facility via EMS due to progressively worsening dyspnea since earlier today associated with worsening of chronic cough with mostly clear sputum production, wheezing and fatigue.  She has frequent rhinorrhea.  She has had some pleuritic chest pain, but no hemoptysis.  Has full warm with some chills, but no night sweats or rigors.  No sore throat or hemoptysis.  No typical chest pain, palpitations, diaphoresis, PND, orthopnea, but has some pitting edema of the lower extremities last week.  She mentioned that Dr. Earlie Server ordered oral furosemide for her, but she has not needed it in several days.  No abdominal pain, diarrhea, constipation, melena or hematochezia.  No flank pain, dysuria, frequency or hematuria.  No polyuria, polydipsia, polyphagia or blurred vision. Her last chemotherapy was 4 days ago.  She stated that she got COVID and flu vaccines yesterday.  She has been sleeping well but her appetite has not been that  great.  ED course: Initial vital signs were temperature 97.8 F, pulse 89, respirations 17, BP 104/57 mmHg and O2 sat 95% on 4 LPM nasal cannula oxygen.  The patient received a 5 mg albuterol neb with EMS and they increase the oxygen flow to 6 LPM prior to arrival.  She received ceftriaxone 1 g IVPB, azithromycin 500 mg IVPB, and a DuoNeb, 2.5 mg albuterol neb.  Lab work: CBC is her white count 7.4, hemoglobin 9.4 g/dL with an MCV of 108.9 fL and platelets 177.  CMP with a nonfasting glucose of 131 mg/dL and an albumin of 2.8 g/dL.  The rest of the CMP was normal.  Imaging: A portable 1 view chest radiograph showed normal cardiac silhouette with tortuous and ectatic aorta.  There were increased interstitial markings in the periphery of both lungs, likely due to scaring.  There was haziness in the left mid and left lower lung field that may be due to layering pleural effusion and underlying atelectasis/pneumonia there is interval improvement in the aeration of the right lower lung field suggesting resolving interstitial edema.   Review of Systems: As mentioned in the history of present illness. All other systems reviewed and are negative. Past Medical History:  Diagnosis Date   Alcoholism in remission ([place]) 08/27/2007   Anemia    Arthritis    OA   Breast cancer ([place])    left breast   COPD 12/17/2006   Diverticulosis 03/27/2007   GERD 05/01/2007   HYPERGLYCEMIA 08/27/2007   HYPERLIPIDEMIA 12/17/2006   HYPERTENSION 12/17/2006   Mood disorder ([place])    hx anxiety and depression. meds short term during life transition   Pneumonia    AS A CHILD   Rectal polyp  03/27/2007   adenoma   SOB (shortness of breath) on exertion    per patient due to having COPD   TIA (transient ischemic attack)    Past Surgical History:  Procedure Laterality Date   ABDOMINAL HYSTERECTOMY  1978   fibroma   APPENDECTOMY  2002   BREAST EXCISIONAL BIOPSY Right 2003   negative   BREAST LUMPECTOMY WITH RADIOACTIVE SEED  LOCALIZATION Left 03/23/2020   Procedure: LEFT BREAST LUMPECTOMY WITH RADIOACTIVE SEED LOCALIZATION;  Surgeon: [name], MD;  Location: [facility];  Service: General;  Laterality: Left;  RNFA   CARPAL TUNNEL RELEASE Left 2010 or 2011   CATARACT EXTRACTION W/ INTRAOCULAR LENS  IMPLANT, BILATERAL Bilateral    COLONOSCOPY W/ POLYPECTOMY  03/27/2007; n7/19/2012   2008: 4 mm adenoma, diverticulosis 2012: ileitis ? NSAID - likely, 2-3 mm cecal polyp LYMPHOID FOLLICLE, diverticulosis   ESOPHAGOGASTRODUODENOSCOPY  01/26/2011   reflux esophagitis, colonscopy done also   HAMMER TOE SURGERY  2008   left foot   HERNIA REPAIR Right 1996?   IR ANGIO INTRA EXTRACRAN SEL COM CAROTID INNOMINATE BILAT MOD SED  10/25/2021   IR ANGIO INTRA EXTRACRAN SEL COM CAROTID INNOMINATE UNI R MOD SED  11/28/2018   IR ANGIO INTRA EXTRACRAN SEL COM CAROTID INNOMINATE UNI R MOD SED  03/17/2021   IR ANGIO INTRA EXTRACRAN SEL INTERNAL CAROTID UNI R MOD SED  12/30/2018   IR ANGIO VERTEBRAL SEL SUBCLAVIAN INNOMINATE UNI R MOD SED  11/28/2018   IR ANGIO VERTEBRAL SEL SUBCLAVIAN INNOMINATE UNI R MOD SED  10/26/2021   IR ANGIO VERTEBRAL SEL VERTEBRAL UNI R MOD SED  03/18/2021   IR ANGIOGRAM FOLLOW UP STUDY  12/30/2018   IR TRANSCATH/EMBOLIZ  12/30/2018   IR US GUIDE VASC ACCESS RIGHT  11/28/2018   IR US GUIDE VASC ACCESS RIGHT  03/17/2021   ORIF ANKLE FRACTURE Right 12/22/2014   Procedure: OPEN REDUCTION INTERNAL FIXATION (ORIF) ANKLE FRACTURE;  Surgeon: Susa Day, MD;  Location: [facility];  Service: Orthopedics;  Laterality: Right;   RADIOLOGY WITH ANESTHESIA N/A 12/30/2018   Procedure: RADIOLOGY WITH ANESTHESIA;  Surgeon: Luanne Bras, MD;  Location: [facility];  Service: Radiology;  Laterality: N/A;   SHOULDER OPEN ROTATOR CUFF REPAIR Left 03/06/2013   Procedure: LEFT ROTATOR CUFF REPAIR, SUBACROMIAL DECOMPRESSION, PATCH GRAFT, MANIPULATION UNDER ANESTHESIA;  Surgeon: Johnn Hai, MD;  Location: [facility];  Service: Orthopedics;  Laterality:  Left;   TONSILLECTOMY     Social History:  reports that she quit smoking about 37 years ago. Her smoking use included cigarettes. She has a 60.00 pack-year smoking history. She has never used smokeless tobacco. She reports that she does not drink alcohol and does not use drugs.  Allergies  Allergen Reactions   Alcohol Other (See Comments)    Recovering Alcoholic*   Cheese Nausea And Vomiting   Dilaudid [Hydromorphone Hcl]     Pt had hypotension after dilaudid 1mg  IV while in the OR, required temporary pressor intervention.   Phenytoin Sodium Extended     Other reaction(s): Not available    Family History  Problem Relation Age of Onset   Throat cancer Mother        65   Heart attack Father 72   Idiopathic pulmonary fibrosis Brother 71   Cancer Brother        sarcoma   Heart disease Brother    Colon cancer Neg Hx    Esophageal cancer Neg Hx    Stomach cancer Neg Hx  Prior to Admission medications   Medication Sig Start Date End Date Taking? Authorizing Provider  albuterol (PROAIR HFA) 108 (90 Base) MCG/ACT inhaler INHALE 2 PUFFS EVERY 4 HOURS AS NEEDED FOR COUGHING SPELLS 05/24/20   Marin Olp, MD  aspirin 325 MG tablet Take 1 tablet (325 mg total) by mouth daily. 01/01/19   Louk, Bea Graff, PA-C  azelastine (ASTELIN) 0.1 % nasal spray Place 2 sprays into both nostrils 2 (two) times daily. Use in each nostril as directed 10/28/21   Cobb, Karie Schwalbe, NP  benzonatate (TESSALON) 200 MG capsule Take 1 capsule (200 mg total) by mouth 3 (three) times daily as needed for cough. 02/21/22   Marin Olp, MD  Cholecalciferol (VITAMIN D) 50 MCG (2000 UT) tablet Take 2,000 Units by mouth daily.    [provider]  clopidogrel (PLAVIX) 75 MG tablet Take 0.5 tablets (37.5 mg total) by mouth daily. 08/03/21   Marin Olp, MD  Coenzyme Q10 300 MG CAPS Take 300 mg by mouth daily.    [provider]  fexofenadine (ALLEGRA) 180 MG tablet Take 180 mg by mouth  daily.    [provider]  fluticasone (FLONASE) 50 MCG/ACT nasal spray Place 2 sprays into both nostrils daily. 09/24/18   Collene Gobble, MD  folic acid (FOLVITE) 1 MG tablet TAKE 1 TABLET(1 MG) BY MOUTH DAILY 03/15/22   Curt Bears, MD  furosemide (LASIX) 20 MG tablet 1 tablet p.o. daily only as needed for swelling or worsening shortness of breath. 04/14/22   Heilingoetter, Cassandra L, PA-C  Glycopyrrolate-Formoterol (BEVESPI AEROSPHERE) 9-4.8 MCG/ACT AERO Inhale 2 puffs into the lungs 2 (two) times daily. 04/14/22   Collene Gobble, MD  hydrochlorothiazide (HYDRODIURIL) 25 MG tablet Take 12.5 mg by mouth every morning. 02/07/22   [provider]  HYDROcodone bit-homatropine (HYCODAN) 5-1.5 MG/5ML syrup Take 5 mLs by mouth every 6 (six) hours as needed for cough. 04/12/22   Heilingoetter, Cassandra L, PA-C  montelukast (SINGULAIR) 10 MG tablet Take 1 tablet (10 mg total) by mouth at bedtime. 10/28/21   Cobb, Karie Schwalbe, NP  Multiple Vitamin (MULTIVITAMIN WITH MINERALS) TABS tablet Take 1 tablet by mouth daily.    [provider]  pantoprazole (PROTONIX) 40 MG tablet Take 1 tablet (40 mg total) by mouth daily. 10/28/21   Cobb, Karie Schwalbe, NP  potassium chloride SA (KLOR-CON M) 20 MEQ tablet Take 20 mEq by mouth every other day. 04/01/22   [provider]  prochlorperazine (COMPAZINE) 10 MG tablet Take 1 tablet (10 mg total) by mouth every 6 (six) hours as needed. Patient not taking: Reported on 02/08/2022 12/21/21   Heilingoetter, Cassandra L, PA-C  Propylene Glycol (SYSTANE BALANCE OP) Place 1 drop into both eyes daily.    [provider]  rosuvastatin (CRESTOR) 20 MG tablet Take 1 tablet (20 mg total) by mouth daily. 08/03/21   Marin Olp, MD  tamoxifen (NOLVADEX) 20 MG tablet Take 1 tablet (20 mg total) by mouth daily. 09/12/21   Alla Feeling, NP  traMADol (ULTRAM) 50 MG tablet TAKE 1 TABLET BY MOUTH EVERY 8 HOURS FOR UP TO 5 DAYS AS NEEDED FOR  PAIN. DO NOT DRIVE FOR 8 HOURS AFTER TAKING 03/08/22   Marin Olp, MD  traZODone (DESYREL) 50 MG tablet TAKE 1/2- 1 TABLET BY MOUTH EVERY NIGHT AT BEDTIME AS NEEDED SLEEP 01/18/22   Marin Olp, MD  vitamin B-12 (CYANOCOBALAMIN) 1000 MCG tablet Take 1,000 mcg by  mouth daily.    [provider]    Physical Exam: Vitals:   04/15/22 1604 04/15/22 1630 04/15/22 1700 04/15/22 1730  BP: (!) 103/52 (!) 94/50 (!) 92/51 (!) 108/53  Pulse: 93 86 91 91  Resp: 20 (!) 23 (!) 21 (!) 21  Temp:      TempSrc:      SpO2: 92% 92% 90% 93%  Weight:      Height:       Physical Exam Vitals and nursing note reviewed.  Constitutional:      General: She is awake.     Appearance: She is well-developed and overweight. She is ill-appearing.     Comments: Chronically ill-appearing.  HENT:     Head: Normocephalic.     Nose: No rhinorrhea.     Mouth/Throat:     Mouth: Mucous membranes are moist.     Pharynx: No oropharyngeal exudate.     Comments: Lips are mildly dry. Eyes:     General: No scleral icterus.    Pupils: Pupils are equal, round, and reactive to light.  Neck:     Vascular: No JVD.  Cardiovascular:     Rate and Rhythm: Normal rate and regular rhythm.     Heart sounds: S1 normal and S2 normal. Murmur heard.     Decrescendo systolic murmur is present with a grade of 2/6.  Pulmonary:     Effort: No tachypnea, accessory muscle usage or respiratory distress.     Breath sounds: Examination of the left-middle field reveals decreased breath sounds and rales. Examination of the left-lower field reveals decreased breath sounds and rales. Decreased breath sounds, wheezing and rales present. No rhonchi.  Abdominal:     General: Bowel sounds are normal. There is no distension.     Palpations: Abdomen is soft.     Tenderness: There is no abdominal tenderness.  Musculoskeletal:     Cervical back: Neck supple.     Right lower leg: No edema.     Left lower leg: No edema.  Skin:     General: Skin is warm and dry.  Neurological:     General: No focal deficit present.     Mental Status: She is alert and oriented to person, place, and time.  Psychiatric:        Mood and Affect: Mood normal.        Behavior: Behavior normal. Behavior is cooperative.    07/07/2020 echocardiogram IMPRESSIONS:   1. The aortic valve is tricuspid. There is mild calcification of the  aortic valve. There is mild thickening of the aortic valve. Aortic valve  regurgitation is not visualized. Mild aortic valve stenosis. Aortic valve  mean gradient measures 10.0 mmHg.  Aortic valve Vmax measures 2.05 m/s.   2. Left ventricular ejection fraction, by estimation, is 60 to 65%. The  left ventricle has normal function. The left ventricle has no regional  wall motion abnormalities. Left ventricular diastolic parameters are  consistent with Grade I diastolic  dysfunction (impaired relaxation).   3. Right ventricular systolic function is normal. The right ventricular  size is normal. Tricuspid regurgitation signal is inadequate for assessing  PA pressure.   4. The mitral valve is grossly normal. Trivial mitral valve  regurgitation. No evidence of mitral stenosis.   5. The inferior vena cava is normal in size with greater than 50%  respiratory variability, suggesting right atrial pressure of 3 mmHg.   Data Reviewed:  There are no new results to review at  this time.  Assessment and Plan: Principal Problem:   Acute on chronic respiratory failure with hypoxia (HCC) In the setting of:   Left lower lobe pulmonary infiltrate and   COPD with acute exacerbation (HCC)   Chronic cough exacerbation Admit to PCU/inpatient. Continue supplemental oxygen. Scheduled and as needed bronchodilators. Continue ceftriaxone 1 g IVPB daily. Continue azithromycin 500 mg IVPB daily. Check strep pneumoniae urinary antigen. Check sputum Gram stain, culture and sensitivity. Follow-up blood culture and  sensitivity. Follow-up CBC and chemistry in the morning. Mucinex 600 mg p.o. twice daily. Continue Hycodan for cough.  Active Problems:   Hyperlipemia Continue statin pending med rec.    Essential hypertension Continue antihypertensives after med rec done.    Gastroesophageal reflux disease Continue pantoprazole 40 mg p.o. daily.    Hyperglycemia Follow-up fasting glucose in AM. Further work-up depending on results.    Aortic atherosclerosis (HCC) On aspirin and statin. Defer beta-blocker due to COPD    Mild aortic stenosis Follow-up with cardiology as an outpatient.    Adenocarcinoma of right lung, stage 4 (HCC) Scheduled for f/up CT chest and CT abdomen/pelvis Monday. We will try to obtain those since she is still here in the hospital.    Microcytic anemia H&H within her baseline. Monitor hematocrit and hemoglobin.   Transfuse as needed.     Advance Care Planning:   Code Status: Full Code the patient was previously DNR, but stated that her cancer seems to be getting better and she wants to witness his grandson engagement in the next few months.  Consults:   Family Communication:   Severity of Illness: The appropriate patient status for this patient is OBSERVATION. Observation status is judged to be reasonable and necessary in order to provide the required intensity of service to ensure the patient's safety. The patient's presenting symptoms, physical exam findings, and initial radiographic and laboratory data in the context of their medical condition is felt to place them at decreased risk for further clinical deterioration. Furthermore, it is anticipated that the patient will be medically stable for discharge from the hospital within 2 midnights of admission.   Author: [name], MD 04/15/2022 5:47 PM  For on call review [email or web address].   This document was prepared using Dragon voice recognition software and may contain some unintended transcription  errors.

## 2022-04-16 DIAGNOSIS — C50912 Malignant neoplasm of unspecified site of left female breast: Secondary | ICD-10-CM | POA: Diagnosis not present

## 2022-04-16 DIAGNOSIS — F419 Anxiety disorder, unspecified: Secondary | ICD-10-CM | POA: Diagnosis not present

## 2022-04-16 DIAGNOSIS — E782 Mixed hyperlipidemia: Secondary | ICD-10-CM | POA: Diagnosis not present

## 2022-04-16 DIAGNOSIS — K8020 Calculus of gallbladder without cholecystitis without obstruction: Secondary | ICD-10-CM | POA: Diagnosis not present

## 2022-04-16 DIAGNOSIS — J189 Pneumonia, unspecified organism: Secondary | ICD-10-CM | POA: Diagnosis present

## 2022-04-16 DIAGNOSIS — J440 Chronic obstructive pulmonary disease with acute lower respiratory infection: Secondary | ICD-10-CM | POA: Diagnosis not present

## 2022-04-16 DIAGNOSIS — J439 Emphysema, unspecified: Secondary | ICD-10-CM | POA: Diagnosis not present

## 2022-04-16 DIAGNOSIS — F32A Depression, unspecified: Secondary | ICD-10-CM | POA: Diagnosis not present

## 2022-04-16 DIAGNOSIS — J90 Pleural effusion, not elsewhere classified: Secondary | ICD-10-CM | POA: Diagnosis not present

## 2022-04-16 DIAGNOSIS — Z79899 Other long term (current) drug therapy: Secondary | ICD-10-CM | POA: Diagnosis not present

## 2022-04-16 DIAGNOSIS — N281 Cyst of kidney, acquired: Secondary | ICD-10-CM | POA: Diagnosis not present

## 2022-04-16 DIAGNOSIS — E785 Hyperlipidemia, unspecified: Secondary | ICD-10-CM | POA: Diagnosis not present

## 2022-04-16 DIAGNOSIS — I700 Atherosclerosis of aorta: Secondary | ICD-10-CM | POA: Diagnosis not present

## 2022-04-16 DIAGNOSIS — R69 Illness, unspecified: Secondary | ICD-10-CM | POA: Diagnosis not present

## 2022-04-16 DIAGNOSIS — M159 Polyosteoarthritis, unspecified: Secondary | ICD-10-CM | POA: Diagnosis not present

## 2022-04-16 DIAGNOSIS — J9621 Acute and chronic respiratory failure with hypoxia: Secondary | ICD-10-CM | POA: Diagnosis not present

## 2022-04-16 DIAGNOSIS — R918 Other nonspecific abnormal finding of lung field: Secondary | ICD-10-CM | POA: Diagnosis not present

## 2022-04-16 DIAGNOSIS — K219 Gastro-esophageal reflux disease without esophagitis: Secondary | ICD-10-CM

## 2022-04-16 DIAGNOSIS — F1021 Alcohol dependence, in remission: Secondary | ICD-10-CM | POA: Diagnosis present

## 2022-04-16 DIAGNOSIS — R739 Hyperglycemia, unspecified: Secondary | ICD-10-CM | POA: Diagnosis not present

## 2022-04-16 DIAGNOSIS — Z1152 Encounter for screening for COVID-19: Secondary | ICD-10-CM | POA: Diagnosis not present

## 2022-04-16 DIAGNOSIS — R053 Chronic cough: Secondary | ICD-10-CM

## 2022-04-16 DIAGNOSIS — C3490 Malignant neoplasm of unspecified part of unspecified bronchus or lung: Secondary | ICD-10-CM | POA: Diagnosis not present

## 2022-04-16 DIAGNOSIS — I350 Nonrheumatic aortic (valve) stenosis: Secondary | ICD-10-CM

## 2022-04-16 DIAGNOSIS — J181 Lobar pneumonia, unspecified organism: Secondary | ICD-10-CM | POA: Diagnosis not present

## 2022-04-16 DIAGNOSIS — K5790 Diverticulosis of intestine, part unspecified, without perforation or abscess without bleeding: Secondary | ICD-10-CM | POA: Diagnosis not present

## 2022-04-16 DIAGNOSIS — J441 Chronic obstructive pulmonary disease with (acute) exacerbation: Secondary | ICD-10-CM

## 2022-04-16 DIAGNOSIS — Z9981 Dependence on supplemental oxygen: Secondary | ICD-10-CM | POA: Diagnosis not present

## 2022-04-16 DIAGNOSIS — R7309 Other abnormal glucose: Secondary | ICD-10-CM | POA: Diagnosis not present

## 2022-04-16 DIAGNOSIS — D509 Iron deficiency anemia, unspecified: Secondary | ICD-10-CM

## 2022-04-16 DIAGNOSIS — I10 Essential (primary) hypertension: Secondary | ICD-10-CM

## 2022-04-16 DIAGNOSIS — C3491 Malignant neoplasm of unspecified part of right bronchus or lung: Secondary | ICD-10-CM

## 2022-04-16 DIAGNOSIS — J309 Allergic rhinitis, unspecified: Secondary | ICD-10-CM | POA: Diagnosis not present

## 2022-04-16 DIAGNOSIS — I739 Peripheral vascular disease, unspecified: Secondary | ICD-10-CM | POA: Diagnosis not present

## 2022-04-16 LAB — STREP PNEUMONIAE URINARY ANTIGEN: Strep Pneumo Urinary Antigen: NEGATIVE

## 2022-04-16 LAB — BASIC METABOLIC PANEL
Anion gap: 5 (ref 5–15)
BUN: 14 mg/dL (ref 8–23)
CO2: 28 mmol/L (ref 22–32)
Calcium: 9 mg/dL (ref 8.9–10.3)
Chloride: 102 mmol/L (ref 98–111)
Creatinine, Ser: 0.66 mg/dL (ref 0.44–1.00)
GFR, Estimated: >60 mL/min (ref >60)
Glucose, Bld: 133 mg/dL — ABNORMAL HIGH (ref 70–99)
Potassium: 4.2 mmol/L (ref 3.5–5.1)
Sodium: 135 mmol/L (ref 135–145)

## 2022-04-16 LAB — CBC
HCT: 28.1 % — ABNORMAL LOW (ref 36.0–46.0)
Hemoglobin: 9.2 g/dL — ABNORMAL LOW (ref 12.0–15.0)
MCH: 34.8 pg — ABNORMAL HIGH (ref 26.0–34.0)
MCHC: 32.7 g/dL (ref 30.0–36.0)
MCV: 106.4 fL — ABNORMAL HIGH (ref 80.0–100.0)
Platelets: 181 10*3/uL (ref 150–400)
RBC: 2.64 MIL/uL — ABNORMAL LOW (ref 3.87–5.11)
RDW: 15.4 % (ref 11.5–15.5)
WBC: 4.6 10*3/uL (ref 4.0–10.5)
nRBC: 0 % (ref 0.0–0.2)

## 2022-04-16 LAB — PROCALCITONIN: Procalcitonin: 0.19 ng/mL

## 2022-04-16 MED ORDER — ADULT MULTIVITAMIN W/MINERALS CH
1.0000 | ORAL_TABLET | Freq: Every day | ORAL | Status: DC
  Administered 2022-04-17 – 2022-04-18 (×2): 1 via ORAL
  Filled 2022-04-16 (×2): qty 1

## 2022-04-16 MED ORDER — TAMOXIFEN CITRATE 10 MG PO TABS
20.0000 mg | ORAL_TABLET | Freq: Every day | ORAL | Status: DC
  Administered 2022-04-16 – 2022-04-18 (×3): 20 mg via ORAL
  Filled 2022-04-16 (×3): qty 2

## 2022-04-16 MED ORDER — VITAMIN D 25 MCG (1000 UNIT) PO TABS
2000.0000 [IU] | ORAL_TABLET | Freq: Every day | ORAL | Status: DC
  Administered 2022-04-17 – 2022-04-18 (×2): 2000 [IU] via ORAL
  Filled 2022-04-16 (×2): qty 2

## 2022-04-16 MED ORDER — FLUTICASONE PROPIONATE 50 MCG/ACT NA SUSP: 2.0000 | Freq: Every day | NASAL | Status: DC | PRN

## 2022-04-16 MED ORDER — ROSUVASTATIN CALCIUM 20 MG PO TABS
20.0000 mg | ORAL_TABLET | Freq: Every day | ORAL | Status: DC
  Administered 2022-04-16 – 2022-04-17 (×2): 20 mg via ORAL
  Filled 2022-04-16 (×2): qty 1

## 2022-04-16 MED ORDER — ASPIRIN 325 MG PO TABS
325.0000 mg | ORAL_TABLET | Freq: Every day | ORAL | Status: DC
  Administered 2022-04-16 – 2022-04-18 (×3): 325 mg via ORAL
  Filled 2022-04-16 (×3): qty 1

## 2022-04-16 MED ORDER — PROCHLORPERAZINE MALEATE 10 MG PO TABS: 10.0000 mg | ORAL_TABLET | Freq: Four times a day (QID) | ORAL | Status: DC | PRN

## 2022-04-16 MED ORDER — VITAMIN B-12 1000 MCG PO TABS
1000.0000 ug | ORAL_TABLET | Freq: Every day | ORAL | Status: DC
  Administered 2022-04-16 – 2022-04-18 (×3): 1000 ug via ORAL
  Filled 2022-04-16 (×3): qty 1

## 2022-04-16 MED ORDER — TRAMADOL HCL 50 MG PO TABS
50.0000 mg | ORAL_TABLET | Freq: Two times a day (BID) | ORAL | Status: DC | PRN
  Administered 2022-04-16 – 2022-04-18 (×4): 50 mg via ORAL
  Filled 2022-04-16 (×4): qty 1

## 2022-04-16 MED ORDER — CLOPIDOGREL BISULFATE 75 MG PO TABS
37.5000 mg | ORAL_TABLET | Freq: Every day | ORAL | Status: DC
  Administered 2022-04-16 – 2022-04-18 (×3): 37.5 mg via ORAL
  Filled 2022-04-16 (×2): qty 1
  Filled 2022-04-16: qty 0.5

## 2022-04-16 MED ORDER — FUROSEMIDE 20 MG PO TABS
20.0000 mg | ORAL_TABLET | Freq: Every day | ORAL | Status: DC
  Administered 2022-04-17 – 2022-04-18 (×2): 20 mg via ORAL
  Filled 2022-04-16 (×2): qty 1

## 2022-04-16 MED ORDER — AZELASTINE HCL 0.1 % NA SOLN: 2.0000 | Freq: Two times a day (BID) | NASAL | Status: DC | PRN

## 2022-04-16 MED ORDER — TRAZODONE HCL 50 MG PO TABS
50.0000 mg | ORAL_TABLET | Freq: Every day | ORAL | Status: DC
  Administered 2022-04-16 – 2022-04-17 (×2): 50 mg via ORAL
  Filled 2022-04-16 (×2): qty 1

## 2022-04-16 NOTE — Progress Notes (Addendum)
PROGRESS NOTE    Sheila Shields  LKG:[id number] DOB: Oct 13, 1934 DOA: 04/15/2022 PCP: Marin Olp, MD    Brief Narrative:  Sheila Shields is a 87 y.o. female with past medical history significant of  anxiety/depression, alcoholism in remission, stage IV adenocarcinoma of the right lung, history of left breast cancer, brain aneurysm, chronic respiratory failure on home O2 at 3 LPM, COPD, chronic cough, GERD, hypertension, hyperglycemia, hyperlipidemia,iron deficiency anemia, mild aortic stenosis, grade 1 diastolic dysfunction, osteoarthritis, peripheral vascular disease, history of multiple pulmonary nodules, was brought into the hospital from independent living facility with progressive shortness of breath and dyspnea with cough and sputum production wheezing and fatigue.  She had been having some rhinorrhea and pleuritic chest pain. Of note patient follows up with oncology as outpatient and was prescribed Lasix which she has not needed in several days.  Has had COVID and flu vaccines recently.  In the ED blood pressure was within normal range.  Pulse ox was 95% on 4 L of nasal cannula oxygen.  Patient did not have leukocytosis.  Albumin was low at 2.8.  Chest x-ray showed increased interstitial markings with some haziness in the left mid and lower lung possibly from pleural effusion/atelectasis pneumonia.  Patient was then considered for admission to the hospital for further evaluation and treatment.  Assessment and plan  Principal Problem:   Acute on chronic respiratory failure with hypoxia (HCC) Active Problems:   Hyperlipemia   Essential hypertension   Gastroesophageal reflux disease   Hyperglycemia   Chronic cough   Aortic atherosclerosis (HCC)   Mild aortic stenosis   Adenocarcinoma of right lung, stage 4 (HCC)   Microcytic anemia   Left lower lobe pulmonary infiltrate   COPD with acute exacerbation (HCC)   Acute on chronic respiratory failure with hypoxia secondary to left lower  lobe pneumonia with COPD exacerbation. Continue nebulizers, supplemental oxygen, IV antibiotics with Rocephin and Zithromax.  Check  sputum, blood for culture and sensitivity.  Supportive care during incentive spirometry..  Continue supplemental oxygen.  Wean oxygen as able.  Blood cultures negative less than 12 hours, procalcitonin 0.1.  No fever or leukocytosis.  Urinary strep antigen negative.  COVID 19 test negative.  Hyperlipidemia. On Crestor.  Will resume.     Essential hypertension Not taking HCTZ.  On Lasix.  Blood pressure seems to be stable at this time.     Gastroesophageal reflux disease Continue PPI     Hyperglycemia Likely reactive.  On steroids.  Last hemoglobin A1c 10 months back was 5.9.     Aortic atherosclerosis (HCC) Continue aspirin, Plavix and statin.     Mild aortic stenosis No active issues.  Follow-up with cardiology as an outpatient.     Adenocarcinoma of right lung, stage 4 (HCC) Scheduled for follow-up CT chest and CT abdomen/pelvis Monday as per oncology.  Has been ordered.  History of breast cancer.  Continue tamoxifen.     Microcytic anemia No evidence of bleeding.  We will continue to monitor closely.  Debility weakness.  Patient states that she does have a walker at home and lives with her husband at home.  We will get PT evaluation.      DVT prophylaxis: SCDs Start: 04/15/22 1738   Code Status:     Code Status: Full Code  Disposition: Home likely in 1 to 2 days  Status is: Observation  The patient will require care spanning > 2 midnights and should be moved to inpatient because: IV antibiotic,  pending clinical improvement   Family Communication:  None at bedside  Consultants:  None  Procedures:  None  Antimicrobials:  Rocephin and Zithromax  Anti-infectives (From admission, onward)    Start     Dose/Rate Route Frequency Ordered Stop   04/16/22 1800  cefTRIAXone (ROCEPHIN) 1 g in sodium chloride 0.9 % 100 mL IVPB        1  g 200 mL/hr over 30 Minutes Intravenous Every 24 hours 04/15/22 1747 04/20/22 1759   04/16/22 1800  azithromycin (ZITHROMAX) 500 mg in sodium chloride 0.9 % 250 mL IVPB        500 mg 250 mL/hr over 60 Minutes Intravenous Every 24 hours 04/15/22 1747 04/20/22 1759   04/15/22 1730  cefTRIAXone (ROCEPHIN) 2 g in sodium chloride 0.9 % 100 mL IVPB        2 g 200 mL/hr over 30 Minutes Intravenous  Once 04/15/22 1726 04/15/22 1857   04/15/22 1730  azithromycin (ZITHROMAX) 500 mg in sodium chloride 0.9 % 250 mL IVPB        500 mg 250 mL/hr over 60 Minutes Intravenous  Once 04/15/22 1726 04/15/22 1919      Subjective: Today, patient was seen and examined at bedside.  Patient states that she has been having some shortness of breath with cough and sputum production.  Denies pain, nausea, vomiting, fever or chills.  Objective: Vitals:   04/16/22 0800 04/16/22 0801 04/16/22 1100 04/16/22 1153  BP: (!) 144/71  131/64   Pulse: 78  83   Resp: (!) 21  19   Temp:  98.5 F (36.9 C)  (!) 97.4 F (36.3 C)  TempSrc:  Oral  Oral  SpO2: 97%  94%   Weight:      Height:        Intake/Output Summary (Last 24 hours) at 04/16/2022 1422 Last data filed at 04/15/2022 1858 Gross per 24 hour  Intake 149.18 ml  Output --  Net 149.18 ml   Filed Weights   04/15/22 1532  Weight: 69.9 kg    Physical Examination: Body mass index is 25.63 kg/m.   General:  Average built, not in obvious distress, on nasal cannula oxygen HENT:   No scleral pallor or icterus noted. Oral mucosa is moist.  Chest:    Diminished breath sounds bilaterally.  Coarse breath sounds noted. CVS: S1 &S2 heard. No murmur.  Regular rate and rhythm. Abdomen: Soft, nontender, nondistended.  Bowel sounds are heard.   Extremities: No cyanosis, clubbing with trace edema.  Peripheral pulses are palpable. Psych: Alert, awake and oriented, normal mood CNS:  No cranial nerve deficits.  Power equal in all extremities.   Skin: Warm and dry.   Bilateral  lower extremity mild erythema noted.  Data Reviewed:   CBC: Recent Labs  Lab 04/12/22 1022 04/15/22 1625 04/16/22 0500  WBC 8.9 7.4 4.6  NEUTROABS 6.3 6.9  --   HGB 10.1* 9.4* 9.2*  HCT 30.9* 29.3* 28.1*  MCV 106.2* 108.9* 106.4*  PLT 252 177 818    Basic Metabolic Panel: Recent Labs  Lab 04/12/22 1022 04/15/22 1625 04/16/22 0500  NA 139 138 135  K 3.7 4.1 4.2  CL 103 104 102  CO2 30 29 28   GLUCOSE 159* 131* 133*  BUN 13 13 14   CREATININE 0.69 0.68 0.66  CALCIUM 9.0 9.1 9.0  PHOS  --  3.9  --     Liver Function Tests: Recent Labs  Lab 04/12/22 1022 04/15/22 1625  AST  18 25  ALT 7 10  ALKPHOS 38 39  BILITOT 0.4 0.8  PROT 6.4* 6.8  ALBUMIN 3.1* 2.8*     Radiology Studies: DG Chest Port 1 View  Result Date: 04/15/2022 CLINICAL DATA:  Shortness of breath EXAM: PORTABLE CHEST 1 VIEW COMPARISON:  Previous studies including the examination of 04/12/2022 FINDINGS: Cardiac size is within normal limits. Thoracic aorta is tortuous and ectatic. Increased interstitial markings are seen in both lungs, more so on the left side. There is haziness in the left lower lung fields, possibly due to layering pleural effusion. There is interval improvement in aeration in right lower lung field. There are no new focal infiltrates. IMPRESSION: Increased interstitial markings are seen in the periphery of both lungs, possibly suggesting scarring. Haziness in left mid and left lower lung fields may be due to layering pleural effusion and underlying atelectasis/pneumonia. There is interval improvement in aeration of right lower lung fields suggesting resolving interstitial edema. Electronically Signed   By: Elmer Picker M.D.   On: 04/15/2022 15:58      LOS: 0 days    Flora Lipps, MD [facility] Available via Epic secure chat 7am-7pm After these hours, please refer to coverage provider listed on [email or web address] 04/16/2022, 2:22 PM

## 2022-04-16 NOTE — Hospital Course (Addendum)
Sheila Shields is a 87 y.o. female with past medical history significant of  anxiety/depression, alcoholism in remission, stage IV adenocarcinoma of the right lung, history of left breast cancer, brain aneurysm, chronic respiratory failure on home O2 at 3 LPM, COPD, chronic cough, GERD, hypertension, hyperglycemia, hyperlipidemia,iron deficiency anemia, mild aortic stenosis, grade 1 diastolic dysfunction, osteoarthritis, peripheral vascular disease, history of multiple pulmonary nodules, was brought into the hospital from independent living facility with progressive shortness of breath and dyspnea with cough and sputum production wheezing and fatigue.  She had been having some rhinorrhea and pleuritic chest pain. Of note patient follows up with oncology as outpatient and was prescribed Lasix which she has not needed in several days.  Has had COVID and flu vaccines recently.  In the ED blood pressure was within normal range.  Pulse ox was 95% on 4 L of nasal cannula oxygen.  Patient did not have leukocytosis.  Albumin was low at 2.8.  Chest x-ray showed increased interstitial markings in the Referral part of the lungs with some haziness in the left mid and lower lung possibly from pleural effusion/atelectasis pneumonia.  Patient was then considered for admission to the hospital for further evaluation and treatment.  Assessment and plan  Acute on chronic respiratory failure with hypoxia secondary to left lower lobe pneumonia with COPD exacerbation. Continue nebulizers oxygen Rocephin and Zithromax.  Check a sputum, blood for culture and sensitivity.  Supportive care.  Continue supplemental oxygen.  Wean oxygen as able.  Blood cultures negative less than 12 hours, procalcitonin 0.1.  No fever or leukocytosis.  Urinary strep antigen negative.  COVID 19 test negative.  Hyperlipidemia. On Crestor.  Will resume.     Essential hypertension Not taking HCTZ.  On Lasix.  Blood pressure seems to be stable at this  time.     Gastroesophageal reflux disease Continue PPI     Hyperglycemia Likely reactive.  On steroids.  Last hemoglobin A1c 10 months back was 5.9.     Aortic atherosclerosis (HCC) Continue aspirin, Plavix and statin.     Mild aortic stenosis No active issues.  Follow-up with cardiology as an outpatient.     Adenocarcinoma of right lung, stage 4 (HCC) Scheduled for follow-up CT chest and CT abdomen/pelvis Monday as per oncology.  Has been ordered.  History of breast cancer.  Continue tamoxifen.     Microcytic anemia No evidence of bleeding.  We will continue to monitor closely.

## 2022-04-17 ENCOUNTER — Telehealth: Payer: Self-pay | Admitting: Medical Oncology

## 2022-04-17 ENCOUNTER — Ambulatory Visit (HOSPITAL_COMMUNITY): Payer: Medicare HMO

## 2022-04-17 ENCOUNTER — Encounter (HOSPITAL_COMMUNITY): Payer: Self-pay

## 2022-04-17 ENCOUNTER — Inpatient Hospital Stay (HOSPITAL_COMMUNITY): Payer: Medicare HMO

## 2022-04-17 LAB — CBC
HCT: 25.2 % — ABNORMAL LOW (ref 36.0–46.0)
Hemoglobin: 8.2 g/dL — ABNORMAL LOW (ref 12.0–15.0)
MCH: 35 pg — ABNORMAL HIGH (ref 26.0–34.0)
MCHC: 32.5 g/dL (ref 30.0–36.0)
MCV: 107.7 fL — ABNORMAL HIGH (ref 80.0–100.0)
Platelets: 155 10*3/uL (ref 150–400)
RBC: 2.34 MIL/uL — ABNORMAL LOW (ref 3.87–5.11)
RDW: 15.6 % — ABNORMAL HIGH (ref 11.5–15.5)
WBC: 5.7 10*3/uL (ref 4.0–10.5)
nRBC: 0 % (ref 0.0–0.2)

## 2022-04-17 LAB — BASIC METABOLIC PANEL
Anion gap: 5 (ref 5–15)
BUN: 18 mg/dL (ref 8–23)
CO2: 29 mmol/L (ref 22–32)
Calcium: 8.8 mg/dL — ABNORMAL LOW (ref 8.9–10.3)
Chloride: 105 mmol/L (ref 98–111)
Creatinine, Ser: 0.58 mg/dL (ref 0.44–1.00)
GFR, Estimated: >60 mL/min (ref >60)
Glucose, Bld: 89 mg/dL (ref 70–99)
Potassium: 3.5 mmol/L (ref 3.5–5.1)
Sodium: 139 mmol/L (ref 135–145)

## 2022-04-17 LAB — MAGNESIUM: Magnesium: 1.5 mg/dL — ABNORMAL LOW (ref 1.7–2.4)

## 2022-04-17 MED ORDER — MAGNESIUM OXIDE -MG SUPPLEMENT 400 (240 MG) MG PO TABS
400.0000 mg | ORAL_TABLET | Freq: Two times a day (BID) | ORAL | Status: DC
  Administered 2022-04-17 – 2022-04-18 (×3): 400 mg via ORAL
  Filled 2022-04-17 (×3): qty 1

## 2022-04-17 MED ORDER — IOHEXOL 300 MG/ML  SOLN
100.0000 mL | Freq: Once | INTRAMUSCULAR | Status: AC | PRN
  Administered 2022-04-17: 100 mL via INTRAVENOUS

## 2022-04-17 MED ORDER — IOHEXOL 9 MG/ML PO SOLN
500.0000 mL | ORAL | Status: AC
  Administered 2022-04-17: 500 mL via ORAL

## 2022-04-17 MED ORDER — IOHEXOL 9 MG/ML PO SOLN
ORAL | Status: AC
  Administered 2022-04-17: 500 mL
  Filled 2022-04-17: qty 1000

## 2022-04-17 MED ORDER — IPRATROPIUM-ALBUTEROL 0.5-2.5 (3) MG/3ML IN SOLN
3.0000 mL | Freq: Two times a day (BID) | RESPIRATORY_TRACT | Status: DC
  Administered 2022-04-17 – 2022-04-18 (×2): 3 mL via RESPIRATORY_TRACT
  Filled 2022-04-17 (×2): qty 3

## 2022-04-17 NOTE — Telephone Encounter (Signed)
Pt in the hospital. She has a question about " the lasix". LVM that hospitalist will decide re Lasix.

## 2022-04-17 NOTE — Progress Notes (Signed)
PROGRESS NOTE    Sheila Shields  HMC:[id number] DOB: [birth year] DOA: 04/15/2022 PCP: Marin Olp, MD    Brief Narrative:  Sheila Shields is a 87 y.o. female with past medical history significant of  anxiety/depression, alcoholism in remission, stage IV adenocarcinoma of the right lung, history of left breast cancer, brain aneurysm, chronic respiratory failure on home O2 at 3 LPM, COPD, chronic cough, GERD, hypertension, hyperglycemia, hyperlipidemia,iron deficiency anemia, mild aortic stenosis, grade 1 diastolic dysfunction, osteoarthritis, peripheral vascular disease, history of multiple pulmonary nodules, was brought into the hospital from independent living facility with progressive shortness of breath and dyspnea with cough and sputum production wheezing and fatigue.  She had been having some rhinorrhea and pleuritic chest pain. Of note patient follows up with oncology as outpatient and was prescribed Lasix which she has not needed in several days.  Has had COVID and flu vaccines recently.  In the ED, blood pressure was within normal range.  Pulse ox was 95% on 4 L of nasal cannula oxygen.  Patient did not have leukocytosis.  Albumin was low at 2.8.  Chest x-ray showed increased interstitial markings with some haziness in the left mid and lower lung possibly from pleural effusion/atelectasis pneumonia.  Patient was then considered for admission to the hospital for further evaluation and treatment.  Assessment and plan  Principal Problem:   Acute on chronic respiratory failure with hypoxia (HCC) Active Problems:   Hyperlipemia   Essential hypertension   Gastroesophageal reflux disease   Hyperglycemia   Chronic cough   Aortic atherosclerosis (HCC)   Mild aortic stenosis   Adenocarcinoma of right lung, stage 4 (HCC)   Microcytic anemia   Left lower lobe pulmonary infiltrate   COPD with acute exacerbation (HCC)   Pneumonia   Acute on chronic respiratory failure with hypoxia secondary  to left lower lobe pneumonia with COPD exacerbation. Continue nebulizers, supplemental oxygen, IV antibiotics with Rocephin and Zithromax.  Blood cultures negative so far.  Continue supportive care during incentive spirometry,supplemental oxygen.  Wean oxygen as able.  Blood cultures negative less than 12 hours, procalcitonin 0.1.  No fever or leukocytosis.  Urinary strep antigen negative.  COVID 19 test negative.  Overall clinically improving.  Hyperlipidemia. Continue Crestor.    Hypomagnesemia.  Magnesium of 1.5.  We will continue with oral magnesium oxide.     Essential hypertension Not taking HCTZ.  On Lasix.  Blood pressure seems to be stable at this time.     Gastroesophageal reflux disease Continue PPI     Hyperglycemia Likely reactive.  On steroids.  Last hemoglobin A1c 10 months back was 5.9.     Aortic atherosclerosis (HCC) Continue aspirin, Plavix and statin.     Mild aortic stenosis No active issues.  Follow-up with cardiology as an outpatient.     Adenocarcinoma of right lung, stage 4 (HCC) Scheduled for follow-up CT chest and CT abdomen/pelvis Monday as per oncology.  We will follow-up CT chest and abdomen and pelvis today.  History of breast cancer.  Continue tamoxifen.     Microcytic anemia No evidence of bleeding.  We will continue to monitor closely.  Debility weakness.  Patient states that she does have a walker at home and lives with her husband at home.  Physical therapy has been consulted.      DVT prophylaxis: SCDs Start: 04/15/22 1738   Code Status:     Code Status: Full Code  Disposition: Home likely on 04/18/2022 if continues to improve  Status is: Inpatient  The patient is inpatient because: IV antibiotic, CT scans, PT evaluation pending   Family Communication:  Spoke with the patient's spouse on the phone and updated him about the clinical condition of the patient.  Consultants:  None  Procedures:  None  Antimicrobials:  Rocephin  and Zithromax 04/15/22>  Anti-infectives (From admission, onward)    Start     Dose/Rate Route Frequency Ordered Stop   04/16/22 1800  cefTRIAXone (ROCEPHIN) 1 g in sodium chloride 0.9 % 100 mL IVPB        1 g 200 mL/hr over 30 Minutes Intravenous Every 24 hours 04/15/22 1747 04/20/22 1759   04/16/22 1800  azithromycin (ZITHROMAX) 500 mg in sodium chloride 0.9 % 250 mL IVPB        500 mg 250 mL/hr over 60 Minutes Intravenous Every 24 hours 04/15/22 1747 04/20/22 1759   04/15/22 1730  cefTRIAXone (ROCEPHIN) 2 g in sodium chloride 0.9 % 100 mL IVPB        2 g 200 mL/hr over 30 Minutes Intravenous  Once 04/15/22 1726 04/15/22 1857   04/15/22 1730  azithromycin (ZITHROMAX) 500 mg in sodium chloride 0.9 % 250 mL IVPB        500 mg 250 mL/hr over 60 Minutes Intravenous  Once 04/15/22 1726 04/15/22 1919      Subjective: Today, patient was seen and examined at bedside.  Has some cough with shortness of breath but little better than yesterday.    Objective: Vitals:   04/16/22 2039 04/17/22 0022 04/17/22 0448 04/17/22 0730  BP: 106/66 127/69 134/61   Pulse: 82 80 87   Resp: 17 17 14 17   Temp: 98.4 F (36.9 C) 98 F (36.7 C) 98.3 F (36.8 C)   TempSrc: Oral Oral Oral   SpO2: 93% 95% 95% 95%  Weight:      Height:        Intake/Output Summary (Last 24 hours) at 04/17/2022 1103 Last data filed at 04/17/2022 0300 Gross per 24 hour  Intake 350.32 ml  Output --  Net 350.32 ml    Filed Weights   04/15/22 1532  Weight: 69.9 kg    Physical Examination: Body mass index is 25.63 kg/m.   General:  Average built, not in obvious distress, on nasal cannula oxygen HENT:   No scleral pallor or icterus noted. Oral mucosa is moist.  Chest:   Diminished breath sounds bilaterally.  CVS: S1 &S2 heard. No murmur.  Regular rate and rhythm. Abdomen: Soft, nontender, nondistended.  Bowel sounds are heard.   Extremities: No cyanosis, clubbing with trace edema.  Peripheral pulses are  palpable. Psych: Alert, awake and oriented, normal mood CNS:  No cranial nerve deficits.  Power equal in all extremities.   Skin: Warm and dry.  Mild erythema noted on the lower extremity   Data Reviewed:   CBC: Recent Labs  Lab 04/12/22 1022 04/15/22 1625 04/16/22 0500 04/17/22 0427  WBC 8.9 7.4 4.6 5.7  NEUTROABS 6.3 6.9  --   --   HGB 10.1* 9.4* 9.2* 8.2*  HCT 30.9* 29.3* 28.1* 25.2*  MCV 106.2* 108.9* 106.4* 107.7*  PLT 252 177 181 155     Basic Metabolic Panel: Recent Labs  Lab 04/12/22 1022 04/15/22 1625 04/16/22 0500 04/17/22 0427  NA 139 138 135 139  K 3.7 4.1 4.2 3.5  CL 103 104 102 105  CO2 30 29 28 29   GLUCOSE 159* 131* 133* 89  BUN 13 13 14  18  CREATININE 0.69 0.68 0.66 0.58  CALCIUM 9.0 9.1 9.0 8.8*  MG  --   --   --  1.5*  PHOS  --  3.9  --   --      Liver Function Tests: Recent Labs  Lab 04/12/22 1022 04/15/22 1625  AST 18 25  ALT 7 10  ALKPHOS 38 39  BILITOT 0.4 0.8  PROT 6.4* 6.8  ALBUMIN 3.1* 2.8*      Radiology Studies: DG Chest Port 1 View  Result Date: 04/15/2022 CLINICAL DATA:  Shortness of breath EXAM: PORTABLE CHEST 1 VIEW COMPARISON:  Previous studies including the examination of 04/12/2022 FINDINGS: Cardiac size is within normal limits. Thoracic aorta is tortuous and ectatic. Increased interstitial markings are seen in both lungs, more so on the left side. There is haziness in the left lower lung fields, possibly due to layering pleural effusion. There is interval improvement in aeration in right lower lung field. There are no new focal infiltrates. IMPRESSION: Increased interstitial markings are seen in the periphery of both lungs, possibly suggesting scarring. Haziness in left mid and left lower lung fields may be due to layering pleural effusion and underlying atelectasis/pneumonia. There is interval improvement in aeration of right lower lung fields suggesting resolving interstitial edema. Electronically Signed   By: Elmer Picker M.D.   On: 04/15/2022 15:58      LOS: 1 day    Flora Lipps, MD [facility] Available via Epic secure chat 7am-7pm After these hours, please refer to coverage provider listed on [email or web address] 04/17/2022, 11:03 AM

## 2022-04-17 NOTE — Evaluation (Signed)
Physical Therapy Evaluation Patient Details Name: Sheila Shields MRN: [id number] DOB: 11/21/1934 Today's Date: 04/17/2022       History of Present Illness  87 yo female admitted with Pna, COPD exac. Hx of COPD-3L O2 dep @baselline , stage IV lung Ca, breat Ca, TIA, anxiety, mild AS, anemia, brain aneurysm  Clinical Impression  On eval, pt was Supv for mobility. She walked ~50 feet x 2 with a rollator. Dyspnea 3/4 with activity.   O2: 87% on 3L at rest, increased to 4L in preparation for walk-O2 up to 91%,dropped to 82% on 4L with ambulation. Once back in room and after resting, returned to 3L on wall O2-sats 89% end of session.      Recommendations for follow up therapy are one component of a multi-disciplinary discharge planning process, led by the attending physician.  Recommendations may be updated based on patient status, additional functional criteria and insurance authorization.  Follow Up Recommendations No PT follow up      Assistance Recommended at Discharge PRN  Patient can return home with the following  Assistance with cooking/housework;Help with stairs or ramp for entrance    Equipment Recommendations None recommended by PT  Recommendations for Other Services       Functional Status Assessment Patient has had a recent decline in their functional status and demonstrates the ability to make significant improvements in function in a reasonable and predictable amount of time.     Precautions / Restrictions Precautions Precautions: Fall Precaution Comments: O2 dep @ baseline Restrictions Weight Bearing Restrictions: No      Mobility  Bed Mobility Overal bed mobility: Modified Independent                  Transfers Overall transfer level: Modified independent                      Ambulation/Gait Ambulation/Gait assistance: Supervision Gait Distance (Feet): 50 Feet (x2) Assistive device: Rollator (4 wheels) Gait Pattern/deviations:  Step-through pattern       General Gait Details: Supv for safety. Seated reat break taken after 50 feet. Then pt walked another 50 feet. Dyspnea 3/4. O2 dropped to 82% on 4L.  Stairs            Wheelchair Mobility    Modified Rankin (Stroke Patients Only)       Balance Overall balance assessment: Mild deficits observed, not formally tested                                           Pertinent Vitals/Pain Pain Assessment Pain Assessment: No/denies pain    Home Living Family/patient expects to be discharged to:: Private residence Living Arrangements: Spouse/significant other Available Help at Discharge: Available 24 hours/day;Family Type of Home: Independent living facility Home Access: Level entry       Home Layout: One level   Additional Comments: Pt lives in [place] at [facility]    Prior Function Prior Level of Function : Independent/Modified Independent             Mobility Comments: baseline 3L O2       Hand Dominance        Extremity/Trunk Assessment   Upper Extremity Assessment Upper Extremity Assessment: Overall WFL for tasks assessed    Lower Extremity Assessment Lower Extremity Assessment: Generalized weakness    Cervical / Trunk Assessment Cervical / Trunk  Assessment: Normal  Communication   Communication: No difficulties  Cognition Arousal/Alertness: Awake/alert Behavior During Therapy: WFL for tasks assessed/performed Overall Cognitive Status: Within Functional Limits for tasks assessed                                          General Comments      Exercises     Assessment/Plan    PT Assessment Patient needs continued PT services  PT Problem List Decreased mobility;Decreased activity tolerance;Decreased balance;Decreased knowledge of use of DME;Cardiopulmonary status limiting activity       PT Treatment Interventions DME instruction;Gait training;Therapeutic activities;Therapeutic  exercise;Patient/family education;Balance training;Functional mobility training    PT Goals (Current goals can be found in the Care Plan section)  Acute Rehab PT Goals Patient Stated Goal: home soon PT Goal Formulation: With patient/family Time For Goal Achievement: 05/01/22 Potential to Achieve Goals: Good    Frequency Min 3X/week     Co-evaluation               AM-PAC PT "6 Clicks" Mobility  Outcome Measure Help needed turning from your back to your side while in a flat bed without using bedrails?: None Help needed moving from lying on your back to sitting on the side of a flat bed without using bedrails?: None Help needed moving to and from a bed to a chair (including a wheelchair)?: None Help needed standing up from a chair using your arms (e.g., wheelchair or bedside chair)?: None Help needed to walk in hospital room?: A Little Help needed climbing 3-5 steps with a railing? : A Little 6 Click Score: 22    End of Session Equipment Utilized During Treatment: Oxygen Activity Tolerance: Patient tolerated treatment well (limited by drop in O2 sats, dyspnea) Patient left: in bed;with call bell/phone within reach;with family/visitor present   PT Visit Diagnosis: Difficulty in walking, not elsewhere classified (R26.2)    Time: 9532-0233 PT Time Calculation (min) (ACUTE ONLY): 25 min   Charges:   PT Evaluation $PT Eval Low Complexity: 1 Low PT Treatments $Gait Training: 8-22 mins          Doreatha Massed, PT Acute Rehabilitation  Office: [phone] Pager: [phone]

## 2022-04-18 ENCOUNTER — Inpatient Hospital Stay: Payer: Medicare HMO

## 2022-04-18 DIAGNOSIS — I700 Atherosclerosis of aorta: Secondary | ICD-10-CM | POA: Diagnosis not present

## 2022-04-18 DIAGNOSIS — J441 Chronic obstructive pulmonary disease with (acute) exacerbation: Secondary | ICD-10-CM | POA: Diagnosis not present

## 2022-04-18 DIAGNOSIS — R918 Other nonspecific abnormal finding of lung field: Secondary | ICD-10-CM

## 2022-04-18 DIAGNOSIS — C3491 Malignant neoplasm of unspecified part of right bronchus or lung: Secondary | ICD-10-CM | POA: Diagnosis not present

## 2022-04-18 DIAGNOSIS — R7309 Other abnormal glucose: Secondary | ICD-10-CM

## 2022-04-18 DIAGNOSIS — J9621 Acute and chronic respiratory failure with hypoxia: Secondary | ICD-10-CM | POA: Diagnosis not present

## 2022-04-18 DIAGNOSIS — J189 Pneumonia, unspecified organism: Principal | ICD-10-CM

## 2022-04-18 LAB — LEGIONELLA PNEUMOPHILA SEROGP 1 UR AG: L. pneumophila Serogp 1 Ur Ag: NEGATIVE

## 2022-04-18 MED ORDER — GUAIFENESIN ER 600 MG PO TB12: 600.0000 mg | ORAL_TABLET | Freq: Two times a day (BID) | ORAL | 0 refills | Status: DC

## 2022-04-18 MED ORDER — PREDNISONE 20 MG PO TABS: 20.0000 mg | ORAL_TABLET | Freq: Every day | ORAL | 0 refills | Status: AC

## 2022-04-18 MED ORDER — DOXYCYCLINE HYCLATE 100 MG PO TABS: 100.0000 mg | ORAL_TABLET | Freq: Two times a day (BID) | ORAL | 0 refills | Status: AC

## 2022-04-18 MED ORDER — MAGNESIUM OXIDE -MG SUPPLEMENT 400 (240 MG) MG PO TABS: 400.0000 mg | ORAL_TABLET | Freq: Every day | ORAL | 0 refills | Status: AC

## 2022-04-18 NOTE — Progress Notes (Signed)
Patient to be discharged to home today. Patient  given discharge instructions including all discharge Medications and schedules for these Medications. Patient verbalized understanding of all discharge teaching. Discharge AVS with the Patient at time of discharge.

## 2022-04-18 NOTE — TOC Transition Note (Signed)
Transition of Care [facility]) - CM/SW Discharge Note   Patient Details  Name: Sheila Shields MRN: [id number] Date of Birth: [birth year]  [facility] CM/SW Contact:  Dessa Phi, RN Phone Number: 04/18/2022, 10:49 AM   Clinical Narrative: d/c back to Indep Living. No CM needs.      Final next level of care: Home/Self Care Barriers to Discharge: No Barriers Identified   Patient Goals and CMS Choice Patient states their goals for this hospitalization and ongoing recovery are::  (Indep living)   Choice offered to / list presented to : Patient  Discharge Placement                       Discharge Plan and Services   Discharge Planning Services: CM Consult Post Acute Care Choice: NA                               Social Determinants of Health (SDOH) Interventions     Readmission Risk Interventions     No data to display

## 2022-04-18 NOTE — Discharge Summary (Signed)
Physician Discharge Summary  PROVIDENCE STIVERS GGY:[id number] DOB: 07-15-1934 DOA: 04/15/2022  PCP: Marin Olp, MD  Admit date: 04/15/2022 Discharge date: 04/18/2022  Admitted From: Home  Discharge disposition: Home  Recommendations for Outpatient Follow-Up:   Follow up with your primary care provider in one week.  Check CBC, BMP, magnesium in the next visit Follow-up with Dr. Lamonte Sakai, pulmonary as scheduled by the clinic  Discharge Diagnosis:   Principal Problem:   Acute on chronic respiratory failure with hypoxia ([place]) Active Problems:   Hyperlipemia   Essential hypertension   Gastroesophageal reflux disease   Hyperglycemia   Chronic cough   Aortic atherosclerosis (HCC)   Mild aortic stenosis   Adenocarcinoma of right lung, stage 4 (HCC)   Microcytic anemia   Left lower lobe pulmonary infiltrate   COPD with acute exacerbation (HCC)   Pneumonia   Discharge Condition: Improved.  Diet recommendation: Low sodium, heart healthy.    Wound care: None.  Code status: Full.   History of Present Illness:   Sheila Shields is a 87 y.o. female with past medical history significant of  anxiety/depression, alcoholism in remission, stage IV adenocarcinoma of the right lung, history of left breast cancer, brain aneurysm, chronic respiratory failure on home O2 at 3 LPM, COPD, chronic cough, GERD, hypertension, hyperglycemia, hyperlipidemia,iron deficiency anemia, mild aortic stenosis, grade 1 diastolic dysfunction, osteoarthritis, peripheral vascular disease, history of multiple pulmonary nodules, was brought into the hospital from independent living facility with progressive shortness of breath and dyspnea with cough and sputum production wheezing and fatigue.  She had been having some rhinorrhea and pleuritic chest pain. Of note patient follows up with oncology as outpatient and was prescribed Lasix which she has not needed in several days.  Has had COVID and flu vaccines recently.   In the ED, blood pressure was within normal range.  Pulse ox was 95% on 4 L of nasal cannula oxygen.  Patient did not have leukocytosis.  Albumin was low at 2.8.  Chest x-ray showed increased interstitial markings with some haziness in the left mid and lower lung possibly from pleural effusion/atelectasis pneumonia.  Patient was then considered for admission to the hospital for further evaluation and treatment.   Hospital Course:   Following conditions were addressed during hospitalization as listed below,  Acute on chronic respiratory failure with hypoxia secondary to left lower lobe pneumonia with COPD exacerbation. Received nebulizers, supplemental oxygen, IV antibiotics with Rocephin and Zithromax during hospitalization. Blood cultures negative in 3 days.  Procalcitonin 0.1.  No fever or leukocytosis.  Urinary strep antigen negative.  COVID 19 test was negative.  Overall improved clinically.   Hyperlipidemia. Continue Crestor.     Hypomagnesemia.  Magnesium of 1.5.  Continue magnesium oxide for few days on discharge.    Essential hypertension Not taking HCTZ.  On Lasix as needed at home..  Blood pressure seems to be stable at this time.     Gastroesophageal reflux disease Continue Protonix.     Hyperglycemia Likely reactive.  On steroids.  Last hemoglobin A1c 10 months back was 5.9.     Aortic atherosclerosis (HCC) Continue aspirin, Plavix and statin.     Mild aortic stenosis No active issues.  Follow-up with cardiology as an outpatient.     Adenocarcinoma of right lung, stage 4 (HCC) Status post CT chest and CT abdomen/pelvis during hospitalization which showed interval decrease in the size of subcarinal and right hilar lymph nodes.  Consolidation of the left lower lobe.  History of breast cancer.  Continue tamoxifen.     Microcytic anemia No evidence of bleeding.  Given outpatient monitoring.   Debility weakness.  Patient states that she does have a walker at home and  lives with her husband at home.  Physical therapy consulted during hospitalization and did not require skilled therapy needs.  Disposition.  At this time, patient is stable for disposition home with outpatient PCP and pulmonary follow-up  Medical Consultants:   None.  Procedures:    None Subjective:   Today, patient was seen and examined at bedside.  He wishes to go home.  Feels overall better.  Discharge Exam:   Vitals:   04/18/22 0516 04/18/22 0758  BP: (!) 137/58   Pulse: 84   Resp: 18   Temp: 97.7 F (36.5 C)   SpO2: 96% 95%   Vitals:   04/17/22 1953 04/17/22 2022 04/18/22 0516 04/18/22 0758  BP:  (!) 112/50 (!) 137/58   Pulse:  91 84   Resp:  17 18   Temp:  98.6 F (37 C) 97.7 F (36.5 C)   TempSrc:  Oral Oral   SpO2: 95% 90% 96% 95%  Weight:      Height:        General: Alert awake, not in obvious distress, on nasal cannula oxygen HENT: pupils equally reacting to light,  No scleral pallor or icterus noted. Oral mucosa is moist.  Chest:  Diminished breath sounds bilaterally.  CVS: S1 &S2 heard. No murmur.  Regular rate and rhythm. Abdomen: Soft, nontender, nondistended.  Bowel sounds are heard.   Extremities: No cyanosis, clubbing with trace edema..  Peripheral pulses are palpable. Psych: Alert, awake and oriented, normal mood CNS:  No cranial nerve deficits.  Power equal in all extremities.   Skin: Warm and dry.  Mild erythema noted on the lower extremity.  The results of significant diagnostics from this hospitalization (including imaging, microbiology, ancillary and laboratory) are listed below for reference.     Diagnostic Studies:   DG Chest Port 1 View  Result Date: 04/15/2022 CLINICAL DATA:  Shortness of breath EXAM: PORTABLE CHEST 1 VIEW COMPARISON:  Previous studies including the examination of 04/12/2022 FINDINGS: Cardiac size is within normal limits. Thoracic aorta is tortuous and ectatic. Increased interstitial markings are seen in both  lungs, more so on the left side. There is haziness in the left lower lung fields, possibly due to layering pleural effusion. There is interval improvement in aeration in right lower lung field. There are no new focal infiltrates. IMPRESSION: Increased interstitial markings are seen in the periphery of both lungs, possibly suggesting scarring. Haziness in left mid and left lower lung fields may be due to layering pleural effusion and underlying atelectasis/pneumonia. There is interval improvement in aeration of right lower lung fields suggesting resolving interstitial edema. Electronically Signed   By: Elmer Picker M.D.   On: 04/15/2022 15:58     Labs:   Basic Metabolic Panel: Recent Labs  Lab 04/12/22 1022 04/15/22 1625 04/16/22 0500 04/17/22 0427  NA 139 138 135 139  K 3.7 4.1 4.2 3.5  CL 103 104 102 105  CO2 30 29 28 29   GLUCOSE 159* 131* 133* 89  BUN 13 13 14 18   CREATININE 0.69 0.68 0.66 0.58  CALCIUM 9.0 9.1 9.0 8.8*  MG  --   --   --  1.5*  PHOS  --  3.9  --   --    GFR Estimated Creatinine Clearance: 50.5  mL/min (by C-G formula based on SCr of 0.58 mg/dL). Liver Function Tests: Recent Labs  Lab 04/12/22 1022 04/15/22 1625  AST 18 25  ALT 7 10  ALKPHOS 38 39  BILITOT 0.4 0.8  PROT 6.4* 6.8  ALBUMIN 3.1* 2.8*   No results for input(s): "LIPASE", "AMYLASE" in the last 168 hours. No results for input(s): "AMMONIA" in the last 168 hours. Coagulation profile No results for input(s): "INR", "PROTIME" in the last 168 hours.  CBC: Recent Labs  Lab 04/12/22 1022 04/15/22 1625 04/16/22 0500 04/17/22 0427  WBC 8.9 7.4 4.6 5.7  NEUTROABS 6.3 6.9  --   --   HGB 10.1* 9.4* 9.2* 8.2*  HCT 30.9* 29.3* 28.1* 25.2*  MCV 106.2* 108.9* 106.4* 107.7*  PLT 252 177 181 155   Cardiac Enzymes: No results for input(s): "CKTOTAL", "CKMB", "CKMBINDEX", "TROPONINI" in the last 168 hours. BNP: Invalid input(s): "POCBNP" CBG: No results for input(s): "GLUCAP" in the last  168 hours. D-Dimer No results for input(s): "DDIMER" in the last 72 hours. Hgb A1c No results for input(s): "HGBA1C" in the last 72 hours. Lipid Profile No results for input(s): "CHOL", "HDL", "LDLCALC", "TRIG", "CHOLHDL", "LDLDIRECT" in the last 72 hours. Thyroid function studies No results for input(s): "TSH", "T4TOTAL", "T3FREE", "THYROIDAB" in the last 72 hours.  Invalid input(s): "FREET3" Anemia work up No results for input(s): "VITAMINB12", "FOLATE", "FERRITIN", "TIBC", "IRON", "RETICCTPCT" in the last 72 hours. Microbiology Recent Results (from the past 240 hour(s))  Blood culture (routine x 2)     Status: None (Preliminary result)   Collection Time: 04/15/22  6:00 PM   Specimen: Left Antecubital; Blood  Result Value Ref Range Status   Specimen Description   Final    LEFT ANTECUBITAL BLOOD Performed at [facility], [address] [place], [place] [address]    Special Requests   Final    BOTTLES DRAWN AEROBIC AND ANAEROBIC Blood Culture adequate volume Performed at [facility] [address], [place], [place] [address]    Culture   Final    NO GROWTH 2 DAYS Performed at [facility] [address], [place], [place] [address]    Report Status PENDING  Incomplete  Blood culture (routine x 2)     Status: None (Preliminary result)   Collection Time: 04/15/22  6:05 PM   Specimen: BLOOD LEFT HAND  Result Value Ref Range Status   Specimen Description   Final    BLOOD LEFT HAND Performed at [facility] [address], [place], [place] [address]    Special Requests   Final    BOTTLES DRAWN AEROBIC ONLY Blood Culture results may not be optimal due to an inadequate volume of blood received in culture bottles Performed at [facility] [address], [place], [place] [address]    Culture   Final    NO GROWTH 2 DAYS Performed at [facility] [address], [place],  [address]    Report  Status PENDING  Incomplete  SARS Coronavirus 2 by RT PCR (hospital order, performed in [facility] hospital lab) *cepheid single result test* Anterior Nasal Swab     Status: None   Collection Time: 04/15/22  7:27 PM   Specimen: Anterior Nasal Swab  Result Value Ref Range Status   SARS Coronavirus 2 by RT PCR NEGATIVE NEGATIVE Final    Comment: (NOTE) SARS-CoV-2 target nucleic acids are NOT DETECTED.  The SARS-CoV-2 RNA is  generally detectable in upper and lower respiratory specimens during the acute phase of infection. The lowest concentration of SARS-CoV-2 viral copies this assay can detect is 250 copies / mL. A negative result does not preclude SARS-CoV-2 infection and should not be used as the sole basis for treatment or other patient management decisions.  A negative result may occur with improper specimen collection / handling, submission of specimen other than nasopharyngeal swab, presence of viral mutation(s) within the areas targeted by this assay, and inadequate number of viral copies (<250 copies / mL). A negative result must be combined with clinical observations, patient history, and epidemiological information.  Fact Sheet for Patients:   https://www.patel.info/  Fact Sheet for Healthcare Providers: https://hall.com/  This test is not yet approved or  cleared by the [place] FDA and has been authorized for detection and/or diagnosis of SARS-CoV-2 by FDA under an Emergency Use Authorization (EUA).  This EUA will remain in effect (meaning this test can be used) for the duration of the COVID-19 declaration under Section 564(b)(1) of the Act, 21 U.S.C. section 360bbb-3(b)(1), unless the authorization is terminated or revoked sooner.  Performed at [facility], [place] [address], [place], [place] [address]      Discharge Instructions:   Discharge Instructions     Diet - low sodium heart healthy    Complete by: As directed    Discharge instructions   Complete by: As directed    Follow-up with your primary care provider in 1 week.  Complete the course of antibiotic and steroids.  Seek medical attention for worsening symptoms.  Follow-up with your pulmonary physician Dr. Lamonte Sakai as has been scheduled by you, discuss about nebulizer/oxygen needs at that time.  Use 4 L of oxygen especially while ambulating.  No overexertion.   Increase activity slowly   Complete by: As directed       Allergies as of 04/18/2022       Reactions   Alcohol Other (See Comments)   "Recovering Alcoholic*   Cheese Nausea And Vomiting   Dilaudid [hydromorphone Hcl] Other (See Comments)   Pt had hypotension after dilaudid 1mg  IV while in the OR, required temporary pressor intervention.   Phenytoin Sodium Extended Other (See Comments)   Reaction not cited        Medication List     STOP taking these medications    fluticasone 50 MCG/ACT nasal spray Commonly known as: FLONASE   hydrochlorothiazide 25 MG tablet Commonly known as: HYDRODIURIL       TAKE these medications    albuterol 108 (90 Base) MCG/ACT inhaler Commonly known as: ProAir HFA INHALE 2 PUFFS EVERY 4 HOURS AS NEEDED FOR COUGHING SPELLS What changed:  how much to take how to take this when to take this reasons to take this additional instructions   aspirin 325 MG tablet Take 1 tablet (325 mg total) by mouth daily. Notes to patient: Last Dose of this Medication was given on April 18, 2022 at 9:40am   azelastine 0.1 % nasal spray Commonly known as: ASTELIN Place 2 sprays into both nostrils 2 (two) times daily. Use in each nostril as directed   benzonatate 200 MG capsule Commonly known as: TESSALON Take 1 capsule (200 mg total) by mouth 3 (three) times daily as needed for cough. What changed:  when to take this additional instructions   Bevespi Aerosphere 9-4.8 MCG/ACT Aero Generic drug:  Glycopyrrolate-Formoterol Inhale 2 puffs into the lungs 2 (two) times daily.   clopidogrel  75 MG tablet Commonly known as: PLAVIX Take 0.5 tablets (37.5 mg total) by mouth daily. Notes to patient: Last Dose of this Medication was given on April 18, 2022 at 9:39am   Coenzyme Q10 300 MG Caps Take 300 mg by mouth daily.   cyanocobalamin 1000 MCG tablet Commonly known as: VITAMIN B12 Take 1,000 mcg by mouth daily. Notes to patient: Last Dose of this Medication was given on April 18, 2022 at 9:40am    doxycycline 100 MG tablet Commonly known as: VIBRA-TABS Take 1 tablet (100 mg total) by mouth 2 (two) times daily for 3 days.   fexofenadine 180 MG tablet Commonly known as: ALLEGRA Take 180 mg by mouth daily.   folic acid 1 MG tablet Commonly known as: FOLVITE TAKE 1 TABLET(1 MG) BY MOUTH DAILY What changed: See the new instructions.   furosemide 20 MG tablet Commonly known as: LASIX 1 tablet p.o. daily only as needed for swelling or worsening shortness of breath. What changed:  how much to take how to take this when to take this reasons to take this additional instructions Notes to patient: Last Dose of this Medication was given on April 18, 2022 at 9:40am   guaiFENesin 600 MG 12 hr tablet Commonly known as: MUCINEX Take 1 tablet (600 mg total) by mouth 2 (two) times daily for 5 days. Notes to patient: Last Dose of this Medication was given on April 18, 2022 at 9:40am This Medication is to be taken 2 times day for 5 Days   HYDROcodone bit-homatropine 5-1.5 MG/5ML syrup Commonly known as: Hycodan Take 5 mLs by mouth every 6 (six) hours as needed for cough.   lactose free nutrition Liqd Take 237 mLs by mouth daily.   magnesium oxide 400 (240 Mg) MG tablet Commonly known as: MAG-OX Take 1 tablet (400 mg total) by mouth daily for 10 days. Notes to patient: Last Dose of this Medication was given on April 18, 2022 at 9:40am Please take this Medication every day  for 10 Days   montelukast 10 MG tablet Commonly known as: SINGULAIR Take 1 tablet (10 mg total) by mouth at bedtime. Notes to patient: Please take this Medication tonight at bedtime   multivitamin with minerals Tabs tablet Take 1 tablet by mouth daily with breakfast. Notes to patient: Last Dose of this Medication was given on April 18, 2022 at 8:13am   pantoprazole 40 MG tablet Commonly known as: Protonix Take 1 tablet (40 mg total) by mouth daily. What changed: when to take this Notes to patient: Last Dose of this Medication was given on April 18, 2022 at 9:40an   potassium chloride SA 20 MEQ tablet Commonly known as: KLOR-CON M Take 20 mEq by mouth every other day. Notes to patient: Please follow dosing instructions for this Medication.  This Medication is to be taken every other Day.    predniSONE 20 MG tablet Commonly known as: DELTASONE Take 1 tablet (20 mg total) by mouth daily with breakfast for 3 days. Notes to patient: This Medication is to be taken Daily for 3 Days Last Dose of this Medication was given on April 18, 2022 at 8:18am    prochlorperazine 10 MG tablet Commonly known as: COMPAZINE Take 1 tablet (10 mg total) by mouth every 6 (six) hours as needed. What changed: reasons to take this   ROBITUSSIN DM PO Take 15-20 mLs by mouth every 3 (three) hours.   rosuvastatin 20 MG tablet Commonly known as: CRESTOR Take 1 tablet (20  mg total) by mouth daily. What changed: when to take this   SYSTANE BALANCE OP Place 1 drop into both eyes 3 (three) times daily as needed (for dryness).   tamoxifen 20 MG tablet Commonly known as: NOLVADEX Take 1 tablet (20 mg total) by mouth daily. Notes to patient: Last Dose of this Medication was given on April 18, 2022 at 9:39am   traMADol 50 MG tablet Commonly known as: ULTRAM TAKE 1 TABLET BY MOUTH EVERY 8 HOURS FOR UP TO 5 DAYS AS NEEDED FOR PAIN. DO NOT DRIVE FOR 8 HOURS AFTER TAKING What changed:  how much to  take how to take this when to take this additional instructions Notes to patient: Last Dose of this Medication was given on April 18, 2022 at 8:16am   traZODone 50 MG tablet Commonly known as: DESYREL TAKE 1/2- 1 TABLET BY MOUTH EVERY NIGHT AT BEDTIME AS NEEDED SLEEP What changed:  how much to take how to take this when to take this additional instructions   Tylenol 8 Hour Arthritis Pain 650 MG CR tablet Generic drug: acetaminophen Take 1,300 mg by mouth in the morning and at bedtime.   Vitamin D3 50 MCG (2000 UT) Tabs Take 2,000 Units by mouth in the morning. Notes to patient: Last Dose of this Medication was given on April 18, 2022 at 9:40am          Time coordinating discharge: 39 minutes  Signed:  Fredric Slabach  [facility] 04/18/2022, 11:55 AM

## 2022-04-18 NOTE — Progress Notes (Signed)
Patient Oxygen Saturations dropping to 79-82% on 3L [place]. MD is in the room and aware. Patient's assessment is without acute changes. Oxygen increased to 4l [place] with Oxygen Saturations 92-92%. Patient on oxygen prior to admission to hospital will discharge on 4l oxygen [place].

## 2022-04-19 ENCOUNTER — Telehealth: Payer: Self-pay

## 2022-04-19 NOTE — Telephone Encounter (Signed)
Transition Care Management Unsuccessful Follow-up Telephone Call  Date of discharge and from where:  04/18/2022  Attempts:  1st Attempt  Reason for unsuccessful TCM follow-up call:  Unable to reach patient

## 2022-04-19 NOTE — Telephone Encounter (Signed)
Sheila Rakes, LPN [facility]  Direct Dial [phone]    Transition Care Management Unsuccessful Follow-up Telephone Call  Date of discharge and from where:  [facility] 04/18/22  Attempts:  1st Attempt  Reason for unsuccessful TCM follow-up call:  Left voice message

## 2022-04-19 NOTE — Telephone Encounter (Signed)
Charlott Rakes, LPN [place] [facility]  Direct Dial [phone]

## 2022-04-20 ENCOUNTER — Encounter: Payer: Self-pay | Admitting: Family Medicine

## 2022-04-20 ENCOUNTER — Telehealth: Payer: Self-pay

## 2022-04-20 ENCOUNTER — Ambulatory Visit (INDEPENDENT_AMBULATORY_CARE_PROVIDER_SITE_OTHER): Payer: Medicare HMO | Admitting: Family Medicine

## 2022-04-20 ENCOUNTER — Telehealth: Payer: Self-pay | Admitting: Family Medicine

## 2022-04-20 VITALS — BP 132/60 | Temp 98.7°F | Ht 65.0 in | Wt 154.6 lb

## 2022-04-20 DIAGNOSIS — J189 Pneumonia, unspecified organism: Secondary | ICD-10-CM

## 2022-04-20 DIAGNOSIS — I10 Essential (primary) hypertension: Secondary | ICD-10-CM | POA: Diagnosis not present

## 2022-04-20 DIAGNOSIS — J410 Simple chronic bronchitis: Secondary | ICD-10-CM | POA: Diagnosis not present

## 2022-04-20 DIAGNOSIS — E782 Mixed hyperlipidemia: Secondary | ICD-10-CM

## 2022-04-20 DIAGNOSIS — J9611 Chronic respiratory failure with hypoxia: Secondary | ICD-10-CM | POA: Diagnosis not present

## 2022-04-20 LAB — CULTURE, BLOOD (ROUTINE X 2)
Culture: NO GROWTH
Culture: NO GROWTH
Special Requests: ADEQUATE

## 2022-04-20 NOTE — Telephone Encounter (Signed)
Transition Care Management Unsuccessful Follow-up Telephone Call  Date of discharge and from where:  04/18/22  hospital   Attempts:  2nd Attempt  Reason for unsuccessful TCM follow-up call:  Left voice message

## 2022-04-20 NOTE — Telephone Encounter (Signed)
Spoke with Dr. Yong Channel and he is ok to work in pt in his schedule this afternoon at 4:20. Pt scheduled as advised 10/12 at 4:20.

## 2022-04-20 NOTE — Patient Instructions (Addendum)
Schedule labs for sometime next week  I like your idea of going to care and wellness to get stronger before coming back home  Heal up quick!   Recommended follow up: Return for next already scheduled visit or sooner if needed.

## 2022-04-20 NOTE — Progress Notes (Signed)
Phone [phone] In person visit   Subjective:   Sheila Shields is a 87 y.o. year old very pleasant female patient who presents for/with See problem oriented charting Chief Complaint  Patient presents with   Follow-up   Fall    Pt states she collapsed this morning in the bathroom, legs felt wobbly.    Past Medical History-  Patient Active Problem List   Diagnosis Date Noted   Adenocarcinoma of right lung, stage 4 ([place]) 12/21/2021    Priority: High   Pulmonary nodules/lesions, multiple 11/15/2021    Priority: High   Chronic respiratory failure ([place]) 10/28/2021    Priority: High   Mild aortic stenosis 05/24/2020    Priority: High   Malignant neoplasm of upper-outer quadrant of left breast in female, estrogen receptor positive ([place]) 02/12/2020    Priority: High   Brain aneurysm 12/30/2018    Priority: High   Chronic cough 10/11/2015    Priority: High   COPD (chronic obstructive pulmonary disease) ([place]) 11/18/2014    Priority: High   Insomnia 05/24/2020    Priority: Medium    Hyperglycemia 08/27/2007    Priority: Medium    Gastroesophageal reflux disease 05/01/2007    Priority: Medium    Hyperlipemia 12/17/2006    Priority: Medium    Essential hypertension 12/17/2006    Priority: Medium    Encounter for antineoplastic chemotherapy 01/18/2022    Priority: Low   Encounter for antineoplastic immunotherapy 12/21/2021    Priority: Low   DOE (dyspnea on exertion) 10/28/2021    Priority: Low   Former smoker 05/22/2019    Priority: Low   Aortic atherosclerosis ([place]) 04/11/2018    Priority: Low   Allergic rhinitis 11/09/2017    Priority: Low   Arm mass, left 04/16/2017    Priority: Low   Iron deficiency anemia, unspecified 01/20/2011    Priority: Low   Arthropathy 02/23/2010    Priority: Low   Alcoholism in remission ([place]) 08/27/2007    Priority: Low   Pneumonia 04/16/2022   Acute on chronic respiratory failure with hypoxia ([place]) 04/15/2022   Acquired hammer toe  of right foot 04/15/2022   Acquired hammer toe of left foot 04/15/2022   Peripheral vascular disease ([place]) 04/15/2022   Microcytic anemia 04/15/2022   Left lower lobe pulmonary infiltrate 04/15/2022   COPD with acute exacerbation ([place]) 04/15/2022   Tenosynovitis of fingers 08/09/2020   Osteoarthritis of ankle and foot 11/13/2017    Medications- reviewed and updated Current Outpatient Medications  Medication Sig Dispense Refill   albuterol (PROAIR HFA) 108 (90 Base) MCG/ACT inhaler INHALE 2 PUFFS EVERY 4 HOURS AS NEEDED FOR COUGHING SPELLS (Patient taking differently: Inhale 2 puffs into the lungs every 4 (four) hours as needed ("for coughing spells").) 18 g 5   aspirin 325 MG tablet Take 1 tablet (325 mg total) by mouth daily. 30 tablet 3   benzonatate (TESSALON) 200 MG capsule Take 1 capsule (200 mg total) by mouth 3 (three) times daily as needed for cough. (Patient taking differently: Take 200 mg by mouth See admin instructions. Take 200 mg by mouth in the morning and an additional 200 mg two times a day as needed for coughing) 90 capsule 2   Cholecalciferol (VITAMIN D3) 50 MCG (2000 UT) TABS Take 2,000 Units by mouth in the morning.     clopidogrel (PLAVIX) 75 MG tablet Take 0.5 tablets (37.5 mg total) by mouth daily. 46 tablet 3   Coenzyme Q10 300 MG CAPS Take 300 mg  by mouth daily.     Dextromethorphan-guaiFENesin (ROBITUSSIN DM PO) Take 15-20 mLs by mouth every 3 (three) hours.     doxycycline (VIBRA-TABS) 100 MG tablet Take 1 tablet (100 mg total) by mouth 2 (two) times daily for 3 days. 6 tablet 0   fexofenadine (ALLEGRA) 180 MG tablet Take 180 mg by mouth daily.     folic acid (FOLVITE) 1 MG tablet TAKE 1 TABLET(1 MG) BY MOUTH DAILY (Patient taking differently: Take 1 mg by mouth daily.) 30 tablet 2   furosemide (LASIX) 20 MG tablet 1 tablet p.o. daily only as needed for swelling or worsening shortness of breath. (Patient taking differently: Take 20 mg by mouth daily as needed ("only  as needed for swelling or worsening shortness of breath").) 5 tablet 0   Glycopyrrolate-Formoterol (BEVESPI AEROSPHERE) 9-4.8 MCG/ACT AERO Inhale 2 puffs into the lungs 2 (two) times daily. 10.7 g 1   lactose free nutrition (BOOST) LIQD Take 237 mLs by mouth daily.     magnesium oxide (MAG-OX) 400 (240 Mg) MG tablet Take 1 tablet (400 mg total) by mouth daily for 10 days. 10 tablet 0   montelukast (SINGULAIR) 10 MG tablet Take 1 tablet (10 mg total) by mouth at bedtime. 30 tablet 5   Multiple Vitamin (MULTIVITAMIN WITH MINERALS) TABS tablet Take 1 tablet by mouth daily with breakfast.     pantoprazole (PROTONIX) 40 MG tablet Take 1 tablet (40 mg total) by mouth daily. (Patient taking differently: Take 40 mg by mouth daily before breakfast.) 30 tablet 5   potassium chloride SA (KLOR-CON M) 20 MEQ tablet Take 20 mEq by mouth every other day.     predniSONE (DELTASONE) 20 MG tablet Take 1 tablet (20 mg total) by mouth daily with breakfast for 3 days. 3 tablet 0   rosuvastatin (CRESTOR) 20 MG tablet Take 1 tablet (20 mg total) by mouth daily. (Patient taking differently: Take 20 mg by mouth at bedtime.) 90 tablet 3   tamoxifen (NOLVADEX) 20 MG tablet Take 1 tablet (20 mg total) by mouth daily. 90 tablet 3   traMADol (ULTRAM) 50 MG tablet TAKE 1 TABLET BY MOUTH EVERY 8 HOURS FOR UP TO 5 DAYS AS NEEDED FOR PAIN. DO NOT DRIVE FOR 8 HOURS AFTER TAKING (Patient taking differently: Take 50 mg by mouth in the morning and at bedtime.) 45 tablet 5   traZODone (DESYREL) 50 MG tablet TAKE 1/2- 1 TABLET BY MOUTH EVERY NIGHT AT BEDTIME AS NEEDED SLEEP (Patient taking differently: Take 50 mg by mouth at bedtime.) 30 tablet 3   TYLENOL 8 HOUR ARTHRITIS PAIN 650 MG CR tablet Take 1,300 mg by mouth in the morning and at bedtime.     vitamin B-12 (CYANOCOBALAMIN) 1000 MCG tablet Take 1,000 mcg by mouth daily.     No current facility-administered medications for this visit.     Objective:  BP 132/60   Temp 98.7 F  (37.1 C)   Ht 5\' 5"  (1.651 m)   Wt 154 lb 9.6 oz (70.1 kg)   SpO2 92%   BMI 25.73 kg/m  Gen: NAD, resting comfortably CV: RRR stable murmur with aortic stenosis  lungs: [place] no wheeze, rhonchi until this clears with coughing, no obvious crackles  Ext: Trace to 1+ edema Skin: warm, dry     Assessment and Plan   #Hospital follow-up for respiratory failure and debility S: Discharge summary was reviewed-patient was brought to the hospital from independent living facility with progressive shortness of breath and  cough and sputum production as well as wheezing and fatigue.  Was also noted to have pleuritic chest pain as well as rhinorrhea.  Patient with mild leukocytosis and low albumin.  Chest x-ray showed increased interstitial markings with some haziness in the left mid and lower lung field concerning for pleural effusion versus atelectasis versus pneumonia.  With underlying COPD patient required nebulizers, steroids,  supplemental oxygen (on this at baseline).  She was started on IV antibiotics with Rocephin and azithromycin to cover her pneumonia.  Blood cultures were negative.  Procalcitonin was not elevated.  Leukocytosis improved.  Urine strep antigen negative.  COVID-19 test was negative.  She improved during the hospital course  She did incidentally have low magnesium and was treated with supplementation for several days-she agrees to come back next week for recheck  For hypertension she was off hydrochlorothiazide-Lasix only as needed at home and was not used in the hospital   She did have an updated CT of chest abdomen pelvis during hospitalization which showed interval decrease in the size of subcarinal and right hilar lymph nodes and once again showed consolidation of the left lower lobe concerning for pneumonia.  Patient was noted to have marked debility/weakness-physical therapy was consulted and they did not think she required skilled therapy needs.  Patient uses walker in  the home and is supported by husband. -first thing in the morning is hard for her- extreme fatigue -this morning did not make it to the commode even with the walker- felt like legs gave out and decided to slowly take herself to ground. No injury as was able to lower herself to the floor. Once up was able to mobilize fine- no further issues throughout the day. Felt weak similarly yesterday morning but no fall.  - did injure her right leg on first day aftergetting home from hospital- portable oxygen fell off the seat and dropped - bled onto floor and got help - Louie Casa with security and Secretary/administrator both came to help after fall -not taking hycodan  [facility] are willing to accept patient for help with strengthening A/P: 87 year old female patient with stage IV non-small cell lung cancer with recent hospitalization for COPD exacerbation and pneumonia -Falls and profound weakness in the morning to me suggest the need for physical therapy-she has access to skilled nursing facility through [facility] and wellness and I think is a great idea at least through the weekend - She has 1 more dose of doxycycline she will take and is finishing up prednisone - Due for CBC, CMP, magnesium-will come back next week for these with our lab closed at time of visit - Recommended trial of albuterol before getting up and moving in the morning since that is her biggest time  -Remains on chronic oxygen-on 3 L today in office and oxygen in the 90 to 92% range -For COPD continue Bevespi   #Stage IV non-small cell lung cancer diagnosed May 2023-has received chemotherapy.  Follows with Dr. Julien Nordmann.  Radiation treatment in 2020. - Thankfully stability to mild decrease on recent CT scan while hospitalized - Continue to follow-up with Dr. Earlie Server   #History of brain aneurysm-follows with Dr. Estanislado Pandy- half dose plavix, full dose aspirin - Stressed importance of avoiding falls especially  being on Plavix and with brain  aneurysm  #hypertension S: medication: none other than prn lasix  -prior Hydrochlorothiazide 12.5 mg was stopped in the hospital-and would take potassium with this every other day. Reduced from  25 mg on 11/23/20.  She is no longer on potassium, she takes Lasix -Did have a reported reading at home as low as high 44R systolic BP Readings from Last 3 Encounters:  04/20/22 132/60  04/18/22 (!) 137/58  04/12/22 (!) 120/58   A/P: Reasonable control of all medicine other than as needed Lasix which she has not taken-continue without blood pressure medicine at present   #hyperlipidemia S: Medication: Rosuvastatin 20 mg daily-previously on simvastatin and had myalgias and arthralgias.  Also did not tolerate atorvastatin Lab Results  Component Value Date   CHOL 131 11/23/2020   HDL 52.50 11/23/2020   LDLCALC 42 11/23/2020   LDLDIRECT 39.0 05/26/2021   TRIG 185.0 (H) 11/23/2020   CHOLHDL 3 11/23/2020   A/P: Excellent control on last check-add lipid panel to next labs    #Anemia- patient with history of anemia-appears to worsen in the hospital but could be dilutional-repeat CBC next week-currently off of iron  Recommended follow up: Return for next already scheduled visit or sooner if needed. Future Appointments  Date Time Provider [place]  04/26/2022 10:30 AM Clayton Bibles, NP [facility] None  04/27/2022 11:00 AM [facility] LAB [facility]  05/03/2022 10:15 AM [facility] None  05/03/2022 10:45 AM Curt Bears, MD [facility] None  05/03/2022 11:30 AM [facility] INFUSION [facility] None  05/24/2022 10:30 AM [facility] LAB [facility] None  05/24/2022 11:00 AM Curt Bears, MD [facility] None  05/24/2022 12:15 PM [facility] INFUSION [facility] None  05/25/2022  9:20 AM Marin Olp, MD [facility]EC  05/29/2022  9:45 AM [facility] LAB [facility] None  06/14/2022 10:15 AM [facility] LAB [facility] None   06/14/2022 10:45 AM Curt Bears, MD [facility] None  06/14/2022 11:30 AM [facility] INFUSION [facility] None  07/05/2022 10:30 AM [facility] LAB [facility] None  07/05/2022 11:00 AM Heilingoetter, Cassandra L, PA-C [facility] None  07/05/2022 11:45 AM [facility] INFUSION [facility] None  08/25/2022  9:30 AM [facility] HEALTH COACH [facility]EC  10/04/2022 11:20 AM Truitt Merle, MD [facility] None    Lab/Order associations:   ICD-10-CM   1. Pneumonia due to infectious organism, unspecified laterality, unspecified part of lung  J18.9     2. Hypomagnesemia  E83.42 Magnesium    3. Essential hypertension  I10 CBC with Differential/Platelet    Comprehensive metabolic panel    4. Chronic respiratory failure with hypoxia (HCC)  J96.11     5. Simple chronic bronchitis (HCC)  J41.0     6. Mixed hyperlipidemia  E78.2      No orders of the defined types were placed in this encounter.  Time Spent: 40 minutes of total time (5:13 PM- 5:53 PM) was spent on the date of the encounter performing the following actions: chart review prior to seeing the patient, obtaining history, performing a medically necessary exam, counseling on the treatment plan, placing orders, and documenting in our EHR.   Return precautions advised.  Garret Reddish, MD

## 2022-04-20 NOTE — Telephone Encounter (Signed)
Caller is Arts development officer, Therapist, sports from [place] [employer].   Caller states: -Patient was admitted to hospital for shortness of breath on 10/07 and discharged on 10/10  - Vitals have been WNL but patient expressed feeling leg weakness on 10/11 - Leg weakness occurred this morning and caused patient to fall. No injuries occurred.  - She believes patient should be seen as soon as possible by PCP instead of in one week per discharge orders.  Please Advise.

## 2022-04-20 NOTE — Telephone Encounter (Signed)
Caller States: -She was expecting a call back about getting patient into the office.   Clinical lead was able to reach PCP team while on hold and pt is being scheduled for 04/20/22 at 4:20pm.   Caller acknowledged that pt may have to wait after arrival and they will be bringing an FL2.

## 2022-04-20 NOTE — Telephone Encounter (Signed)
She was seen today-unable to bill TCM at this point

## 2022-04-21 ENCOUNTER — Other Ambulatory Visit: Payer: Self-pay | Admitting: Nurse Practitioner

## 2022-04-21 DIAGNOSIS — J189 Pneumonia, unspecified organism: Secondary | ICD-10-CM | POA: Diagnosis not present

## 2022-04-21 DIAGNOSIS — M6281 Muscle weakness (generalized): Secondary | ICD-10-CM | POA: Diagnosis not present

## 2022-04-21 DIAGNOSIS — R058 Other specified cough: Secondary | ICD-10-CM

## 2022-04-21 DIAGNOSIS — J301 Allergic rhinitis due to pollen: Secondary | ICD-10-CM

## 2022-04-21 DIAGNOSIS — R278 Other lack of coordination: Secondary | ICD-10-CM | POA: Diagnosis not present

## 2022-04-22 DIAGNOSIS — J189 Pneumonia, unspecified organism: Secondary | ICD-10-CM | POA: Diagnosis not present

## 2022-04-22 DIAGNOSIS — R278 Other lack of coordination: Secondary | ICD-10-CM | POA: Diagnosis not present

## 2022-04-22 DIAGNOSIS — M6281 Muscle weakness (generalized): Secondary | ICD-10-CM | POA: Diagnosis not present

## 2022-04-24 ENCOUNTER — Inpatient Hospital Stay: Payer: Medicare HMO | Admitting: Family Medicine

## 2022-04-24 DIAGNOSIS — J189 Pneumonia, unspecified organism: Secondary | ICD-10-CM | POA: Diagnosis not present

## 2022-04-24 DIAGNOSIS — R278 Other lack of coordination: Secondary | ICD-10-CM | POA: Diagnosis not present

## 2022-04-24 DIAGNOSIS — S81811A Laceration without foreign body, right lower leg, initial encounter: Secondary | ICD-10-CM | POA: Diagnosis not present

## 2022-04-24 DIAGNOSIS — M6281 Muscle weakness (generalized): Secondary | ICD-10-CM | POA: Diagnosis not present

## 2022-04-24 DIAGNOSIS — J449 Chronic obstructive pulmonary disease, unspecified: Secondary | ICD-10-CM | POA: Diagnosis not present

## 2022-04-25 ENCOUNTER — Inpatient Hospital Stay: Payer: Medicare HMO

## 2022-04-25 DIAGNOSIS — R278 Other lack of coordination: Secondary | ICD-10-CM | POA: Diagnosis not present

## 2022-04-25 DIAGNOSIS — M6281 Muscle weakness (generalized): Secondary | ICD-10-CM | POA: Diagnosis not present

## 2022-04-25 DIAGNOSIS — R2689 Other abnormalities of gait and mobility: Secondary | ICD-10-CM | POA: Diagnosis not present

## 2022-04-25 DIAGNOSIS — J189 Pneumonia, unspecified organism: Secondary | ICD-10-CM | POA: Diagnosis not present

## 2022-04-26 ENCOUNTER — Other Ambulatory Visit: Payer: Self-pay | Admitting: Family Medicine

## 2022-04-26 ENCOUNTER — Ambulatory Visit: Payer: Medicare HMO

## 2022-04-26 ENCOUNTER — Ambulatory Visit (INDEPENDENT_AMBULATORY_CARE_PROVIDER_SITE_OTHER): Payer: Medicare HMO | Admitting: Nurse Practitioner

## 2022-04-26 ENCOUNTER — Encounter: Payer: Self-pay | Admitting: Nurse Practitioner

## 2022-04-26 ENCOUNTER — Encounter: Payer: Self-pay | Admitting: Family Medicine

## 2022-04-26 VITALS — BP 100/50 | HR 88 | Ht 65.0 in | Wt 154.6 lb

## 2022-04-26 DIAGNOSIS — M6281 Muscle weakness (generalized): Secondary | ICD-10-CM | POA: Diagnosis not present

## 2022-04-26 DIAGNOSIS — J189 Pneumonia, unspecified organism: Secondary | ICD-10-CM | POA: Diagnosis not present

## 2022-04-26 DIAGNOSIS — J441 Chronic obstructive pulmonary disease with (acute) exacerbation: Secondary | ICD-10-CM | POA: Diagnosis not present

## 2022-04-26 DIAGNOSIS — R918 Other nonspecific abnormal finding of lung field: Secondary | ICD-10-CM | POA: Diagnosis not present

## 2022-04-26 DIAGNOSIS — J4489 Other specified chronic obstructive pulmonary disease: Secondary | ICD-10-CM | POA: Diagnosis not present

## 2022-04-26 DIAGNOSIS — J9611 Chronic respiratory failure with hypoxia: Secondary | ICD-10-CM

## 2022-04-26 DIAGNOSIS — C3491 Malignant neoplasm of unspecified part of right bronchus or lung: Secondary | ICD-10-CM

## 2022-04-26 DIAGNOSIS — J439 Emphysema, unspecified: Secondary | ICD-10-CM | POA: Diagnosis not present

## 2022-04-26 DIAGNOSIS — R2689 Other abnormalities of gait and mobility: Secondary | ICD-10-CM | POA: Diagnosis not present

## 2022-04-26 MED ORDER — BREZTRI AEROSPHERE 160-9-4.8 MCG/ACT IN AERO: 2.0000 | INHALATION_SPRAY | Freq: Two times a day (BID) | RESPIRATORY_TRACT | 6 refills | Status: AC

## 2022-04-26 MED ORDER — PROMETHAZINE-DM 6.25-15 MG/5ML PO SYRP: 5.0000 mL | ORAL_SOLUTION | Freq: Four times a day (QID) | ORAL | 0 refills | Status: AC | PRN

## 2022-04-26 MED ORDER — PREDNISONE 10 MG PO TABS: ORAL_TABLET | ORAL | 0 refills | Status: DC

## 2022-04-26 NOTE — Assessment & Plan Note (Signed)
Stable O2 requirement. She is able to maintain on 4 lpm. Goal >88-90%

## 2022-04-26 NOTE — Assessment & Plan Note (Signed)
Complete IV abx with rocephin and azithromycin then discharged on PO doxy. Clinically stable; mild improvement. Cough is no better but no worse. See above. Advised her that if she were to worsen or doesn't start to see some improvement, to come back in for repeat imaging. Otherwise, we will reassess in 5-6 weeks to ensure resolution.

## 2022-04-26 NOTE — Patient Instructions (Addendum)
Stop Bevespi. Start Breztri 2 puffs Twice daily. Brush tongue and rinse mouth afterwards Continue Albuterol inhaler 2 puffs every 6 hours as needed for shortness of breath or wheezing. Notify if symptoms persist despite rescue inhaler/neb use.  Continue allegra 1 tab daily for allergies Continue singulair 1 tab At bedtime  Continue protonix 1 tab daily  Continue supplemental oxygen 4 lpm POC with activity and continuous at night for goal >88-90%  Benzonatate 1 capsule Three times a day for cough Phenergan DM cough syrup 5 mL every 6 hours as needed for cough Prednisone taper. 4 tabs for 2 days, then 3 tabs for 2 days, 2 tabs for 2 days, then 1 tab for 2 days, then stop. Take in AM with food.  Mucinex [phone] mg Twice daily for cough/congestion  Follow up with oncology as scheduled.   Follow up in 5 weeks with Dr. Lamonte Sakai and repeat chest x ray. If symptoms do not improve or worsen, please contact office for sooner follow up or seek emergency care.

## 2022-04-26 NOTE — Progress Notes (Signed)
@Patient  ID: Sheila Shields, female    DOB: 28-Mar-1935, 87 y.o.   MRN: [id number]  Chief Complaint  Patient presents with   Follow-up    Still has a cough.    Referring provider: Marin Olp, MD  HPI: 87 year old female, former smoker (60 pack years) followed for COPD with chronic bronchitis, allergic rhinitis, upper airway cough.  She is a patient of Dr. Agustina Caroli and last seen in office 11/15/2021.  She is currently undergoing treatment for stage IV adenocarcinoma of the right lung and followed by Dr. Julien Nordmann.  Currently on Keytruda and radiation treatments.  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  She was recently admitted for acute on chronic respiratory failure related to LLL pneumonia from 04/15/2022-04/18/2022.  Treated with IV Rocephin and azithromycin.  She was discharged on doxycycline and prednisone.  CT imaging during her stay showed stable lung nodules and slight decrease in the left lower lobe mass.  She was discharged on 4 L supplemental O2.  She then saw Dr. Yong Channel for follow-up on 04/20/2022.  She still had persistent weakness after discharged and significant debility.  She had been evaluated during her stay but determined by physical therapy to not need SNF placement.  Given her lack of improvement, he recommended that she go to care and wellness SNF at [facility] to help work on strength and conditioning.  TEST/EVENTS:  10/09/2013 PFTs: FVC 93, FEV1 87, ratio 70, TLC 79, DLCOunc 48%.  Mild obstructive airway disease with diffusion defect.  No significant bronchodilator response. 08/31/2019 echocardiogram: EF 60 to 65%.  G1 DD is present.  RV size and function is normal.  Unable to measure PASP.  There is mild AAS.  Trivial MR. 11/17/2021 pathology from right acetabular mass consistent with metastatic adenocarcinoma from primary  lung 10/28/2021 CTA chest: There is a 2.5 x 2.3 left lower lobe lung mass, concerning for underlying neoplasm.  Multiple bilateral subcentimeter lung nodules.  There is also a 3.5 x 2.5 x 3.4 well-defined soft tissue mass in the left breast.  Low-attenuation liver lesions may represent multiple hepatic cysts. 11/09/2021 PET scan: Hypermetabolism to the left lower lobe pulmonary mass.  There is mild hypermetabolic left hilar and mediastinal lymphadenopathy, consistent with metastatic disease.  There are also several hypermetabolic bone metastases in the spine and pelvis.  There is a 2 cm hypermetabolic right thyroid lobe nodule, malignancy cannot be excluded. 04/17/2022 CT chest with contrast: Unchanged, stable small left pleural effusion.  Bandlike area of consolidation in the left lower lobe, likely secondary to radiation.  Underlying nodule is 2.9 x 1.1, decreased from 2.9 by 1.6.  Other nodules are stable.  She has some new consolidation in the left upper lobe and interstitial thickening in the right upper lobe and right lower lobe.  Severe emphysematous changes.  10/07/2021: OV with Dr. Lamonte Sakai.  Treated several times since November for AECOPD.  Reported today that she felt better but still has exertional shortness of breath.  Also has noted some desaturations at home.  Does continue to have a persistent cough.  Tried Trelegy for 2 months and felt that she benefited from it.  Felt as though symptoms were more subacute and concerned that she had some upper airway lability.  Unclear if she actually needs ICS; not a person who flares frequently before the last few months.  Decided to try her on Stiolto;  ultimately had to start on Bevespi instead due to insurance coverage options.  Continued Allegra and Flonase for allergic rhinitis.  Continued on Protonix 20 mg daily for GERD.  10/28/2021: OV with Artemis Loyal NP for scheduled follow-up.  Since we saw her last, she reports feeling relatively the same.  Cough is persistent,  occasionally with yellow to green sputum.  She has had progressive DOE since November.  Feels like it may just be related to her getting older.  Has not been routinely monitoring her oxygen levels at home but does note that she normally is around the low 90s.  Does report some persistent allergy type symptoms with nasal congestion and itchy, watery eyes.  Upon further investigation, found that patient has lost around 25 pounds since September unintentionally.  Does report that she is not snacking as frequently and at their retirement community, she no longer has to cook.  Also feels like her appetite has not been as hearty as it used to be.  She denies any hemoptysis, recent calf pain or tenderness, lower extremity swelling, night sweats.  Unsure if she notices much of a difference with the Bevespi inhaler.  Continues on Allegra and Flonase daily.  Does occasionally get a burning sensation in her chest and suspects this may be some reflux.  She is currently on Protonix 20 mg daily.  She is currently on tamoxifen for breast cancer and has been under surveillance with oncology.  She had desaturations on room air.  Was started on oxygen therapy.  D-dimer was positive.  CTA chest was ordered to rule out PE, negative but showed a large 2.5 x 2.3 mass in the left lower lobe and scattered, smaller pulmonary nodules concerning for malignancy.  PET scan was ordered for further evaluation.  11/15/2021: OV with Dr. Lamonte Sakai.  Rounded pulmonary nodules, evidence of nodal disease and metastatic disease to spine and sacrum.  Consistent with metastatic breast cancer but certainly possibly related to primary lung cancer.  She will need a needle biopsy of the left lower lobe.  She will likely need radiation therapy ASAP.  If this is lung cancer then will consult with thoracic oncology.  04/27/2022: Today-follow-up Patient presents today for follow-up after being admitted for pneumonia and acute on chronic respiratory failure.  She  then was seen by Dr. Yong Channel on 10/12 and advised to go to SNF to work on rehab/strengthening.  She tells me that she is back at home with her husband now at their independent nursing facility.  She is not sure that the SNF did much for her.  Feels like they did not work a whole lot with her while she was there.  From a respiratory standpoint, she feels relatively similar compared to when she was discharged.  Still has a persistent cough which is no better but no worse.  Makes her short of breath at times.  Cough is primarily nonproductive but congested.  Feels like her breathing is at her baseline from the last few months.  She does get short winded with minimal activity so she does not do very much.  This is not anything new for her since she started on chemo/radiation.  She has persistent fatigue symptoms and generalized weakness.  Denies any fevers, chills, hemoptysis, lower extremity swelling, orthopnea, increased oxygen demands.  She was on 4 L supplemental O2.  Occasionally she will notice that she drops into the high 70s but she recovers quickly within a minute without any intervention.  Otherwise, her oxygen stays  around 90%.  She is currently on Bevespi.  Not using albuterol often.  Takes Singulair and Allegra for allergies.  She is supposed to start treatment again next week for her stage IV adenocarcinoma.  Allergies  Allergen Reactions   Alcohol Other (See Comments)    "Recovering Alcoholic*   Cheese Nausea And Vomiting   Dilaudid [Hydromorphone Hcl] Other (See Comments)    Pt had hypotension after dilaudid 1mg  IV while in the OR, required temporary pressor intervention.   Phenytoin Sodium Extended Other (See Comments)    Reaction not cited    Immunization History  Administered Date(s) Administered   Fluad Quad(high Dose 65+) 04/04/2019, 03/22/2021   Influenza Split 04/09/2012   Influenza Whole 05/01/2007, 04/06/2008, 04/21/2009, 03/24/2010, 04/10/2011   Influenza, High Dose Seasonal PF  04/09/2015, 03/17/2016, 05/22/2016, 04/16/2017, 05/21/2017, 03/20/2018, 05/20/2018, 05/26/2019, 04/10/2020, 05/26/2020, 05/26/2021   Influenza,inj,Quad PF,6+ Mos 03/03/2013   Influenza,inj,quad, With Preservative 03/20/2018   Influenza-Unspecified 04/09/2014, 04/09/2015, 04/09/2017, 03/20/2018, 04/14/2022   PFIZER Comirnaty(Gray Top)Covid-19 Tri-Sucrose Vaccine 12/01/2021   PFIZER(Purple Top)SARS-COV-2 Vaccination 07/27/2019, 08/14/2019, 04/30/2020, 05/26/2020, 12/07/2020   Pfizer Covid-19 Vaccine Bivalent Booster 27yrs & up 06/15/2021   Pneumococcal Conjugate-13 05/05/2015   Pneumococcal Polysaccharide-23 04/09/2004, 05/21/2017, 05/20/2018, 05/26/2019, 05/26/2020, 05/26/2021   Td 12/08/2005   Tdap 12/22/2014   Zoster Recombinat (Shingrix) 08/01/2017, 10/03/2017   Zoster, Live 01/06/2008    Past Medical History:  Diagnosis Date   Alcoholism in remission ([place]) 08/27/2007   Anemia    Arthritis    OA   Breast cancer ([place])    left breast   COPD 12/17/2006   Diverticulosis 03/27/2007   GERD 05/01/2007   HYPERGLYCEMIA 08/27/2007   HYPERLIPIDEMIA 12/17/2006   HYPERTENSION 12/17/2006   Mood disorder ([place])    hx anxiety and depression. meds short term during life transition   Pneumonia    AS A CHILD   Rectal polyp 03/27/2007   adenoma   SOB (shortness of breath) on exertion    per patient due to having COPD   TIA (transient ischemic attack)     Tobacco History: Social History   Tobacco Use  Smoking Status Former   Packs/day: 2.00   Years: 30.00   Total pack years: 60.00   Types: Cigarettes   Quit date: 07/10/1984   Years since quitting: 37.8  Smokeless Tobacco Never   Counseling given: Not Answered   Outpatient Medications Prior to Visit  Medication Sig Dispense Refill   benzonatate (TESSALON) 200 MG capsule Take 1 capsule (200 mg total) by mouth 3 (three) times daily as needed for cough. (Patient taking differently: Take 200 mg by mouth See admin instructions. Take 200 mg by  mouth in the morning and an additional 200 mg two times a day as needed for coughing) 90 capsule 2   Dextromethorphan-guaiFENesin (ROBITUSSIN DM PO) Take 15-20 mLs by mouth every 3 (three) hours.     fexofenadine (ALLEGRA) 180 MG tablet Take 180 mg by mouth daily.     montelukast (SINGULAIR) 10 MG tablet TAKE 1 TABLET(10 MG) BY MOUTH AT BEDTIME 30 tablet 5   Glycopyrrolate-Formoterol (BEVESPI AEROSPHERE) 9-4.8 MCG/ACT AERO Inhale 2 puffs into the lungs 2 (two) times daily. 10.7 g 1   albuterol (PROAIR HFA) 108 (90 Base) MCG/ACT inhaler INHALE 2 PUFFS EVERY 4 HOURS AS NEEDED FOR COUGHING SPELLS (Patient taking differently: Inhale 2 puffs into the lungs every 4 (four) hours as needed ("for coughing spells").) 18 g 5   aspirin 325 MG tablet Take 1 tablet (325 mg  total) by mouth daily. 30 tablet 3   Cholecalciferol (VITAMIN D3) 50 MCG (2000 UT) TABS Take 2,000 Units by mouth in the morning.     clopidogrel (PLAVIX) 75 MG tablet Take 0.5 tablets (37.5 mg total) by mouth daily. 46 tablet 3   Coenzyme Q10 300 MG CAPS Take 300 mg by mouth daily.     folic acid (FOLVITE) 1 MG tablet TAKE 1 TABLET(1 MG) BY MOUTH DAILY (Patient taking differently: Take 1 mg by mouth daily.) 30 tablet 2   furosemide (LASIX) 20 MG tablet 1 tablet p.o. daily only as needed for swelling or worsening shortness of breath. (Patient taking differently: Take 20 mg by mouth daily as needed ("only as needed for swelling or worsening shortness of breath").) 5 tablet 0   lactose free nutrition (BOOST) LIQD Take 237 mLs by mouth daily.     magnesium oxide (MAG-OX) 400 (240 Mg) MG tablet Take 1 tablet (400 mg total) by mouth daily for 10 days. 10 tablet 0   Multiple Vitamin (MULTIVITAMIN WITH MINERALS) TABS tablet Take 1 tablet by mouth daily with breakfast.     pantoprazole (PROTONIX) 40 MG tablet TAKE 1 TABLET(40 MG) BY MOUTH DAILY 30 tablet 5   potassium chloride SA (KLOR-CON M) 20 MEQ tablet Take 20 mEq by mouth every other day.      rosuvastatin (CRESTOR) 20 MG tablet TAKE 1 TABLET(20 MG) BY MOUTH DAILY 90 tablet 3   tamoxifen (NOLVADEX) 20 MG tablet Take 1 tablet (20 mg total) by mouth daily. 90 tablet 3   traMADol (ULTRAM) 50 MG tablet TAKE 1 TABLET BY MOUTH EVERY 8 HOURS FOR UP TO 5 DAYS AS NEEDED FOR PAIN. DO NOT DRIVE FOR 8 HOURS AFTER TAKING (Patient taking differently: Take 50 mg by mouth in the morning and at bedtime.) 45 tablet 5   traZODone (DESYREL) 50 MG tablet TAKE 1/2- 1 TABLET BY MOUTH EVERY NIGHT AT BEDTIME AS NEEDED SLEEP (Patient taking differently: Take 50 mg by mouth at bedtime.) 30 tablet 3   TYLENOL 8 HOUR ARTHRITIS PAIN 650 MG CR tablet Take 1,300 mg by mouth in the morning and at bedtime.     vitamin B-12 (CYANOCOBALAMIN) 1000 MCG tablet Take 1,000 mcg by mouth daily.     No facility-administered medications prior to visit.     Review of Systems:   Constitutional: No night sweats, fevers, chills +fatigue, lassitude HEENT: No headaches, difficulty swallowing, tooth/dental problems, or sore throat. No sneezing, ear ache, nasal congestion, itchy, watery eyes CV:  No chest pain, orthopnea, PND, swelling in lower extremities, anasarca, dizziness, palpitations, syncope Resp: +shortness of breath with exertion (progressive decline); congested cough (unchanged).  No hemoptysis. No wheezing.  No chest wall deformity GI:  No indigestion, heartburn, abdominal pain, nausea, vomiting, diarrhea, change in bowel habits, loss of appetite, bloody stools.  MSK:  No joint pain or swelling.  No decreased range of motion.  No back pain. Neuro: No dizziness or lightheadedness.  Psych: No depression or anxiety. Mood stable.     Physical Exam:  BP (!) 100/50 (BP Location: Left Arm)   Pulse 88   Ht 5\' 5"  (1.651 m)   Wt 154 lb 9.6 oz (70.1 kg)   SpO2 91% Comment: On oxygen  BMI 25.73 kg/m   GEN: Pleasant, interactive, well-appearing; in no acute distress. HEENT:  Normocephalic and atraumatic. PERRLA. Sclera  white. Nasal turbinates pink, moist and patent bilaterally. No rhinorrhea present. Oropharynx pink and moist, without exudate or edema.  No lesions, ulcerations NECK:  Supple w/ fair ROM. No JVD present. Normal carotid impulses w/o bruits. Thyroid symmetrical with no goiter or nodules palpated. No lymphadenopathy.   CV: RRR, no m/r/g, no peripheral edema. Pulses intact, +2 bilaterally. No cyanosis, pallor or clubbing. PULMONARY:  Unlabored, regular breathing.  Diminished bilaterally A&P w/o wheezes/rales/rhonchi. Bronchitic cough. No accessory muscle use. No dullness to percussion. GI: BS present and normoactive. Soft, non-tender to palpation. No organomegaly or masses detected. No CVA tenderness. MSK: No erythema, warmth or tenderness. Cap refil <2 sec all extrem. No deformities or joint swelling noted.  Neuro: A/Ox3. No focal deficits noted.   Skin: Warm, no lesions or rashe Psych: Normal affect and behavior. Judgement and thought content appropriate.     Lab Results:  CBC    Component Value Date/Time   WBC 5.7 04/17/2022 0427   RBC 2.34 (L) 04/17/2022 0427   HGB 8.2 (L) 04/17/2022 0427   HGB 10.1 (L) 04/12/2022 1022   HCT 25.2 (L) 04/17/2022 0427   PLT 155 04/17/2022 0427   PLT 252 04/12/2022 1022   MCV 107.7 (H) 04/17/2022 0427   MCH 35.0 (H) 04/17/2022 0427   MCHC 32.5 04/17/2022 0427   RDW 15.6 (H) 04/17/2022 0427   LYMPHSABS 0.1 (L) 04/15/2022 1625   MONOABS 0.2 04/15/2022 1625   EOSABS 0.2 04/15/2022 1625   BASOSABS 0.0 04/15/2022 1625    BMET    Component Value Date/Time   NA 139 04/17/2022 0427   K 3.5 04/17/2022 0427   CL 105 04/17/2022 0427   CO2 29 04/17/2022 0427   GLUCOSE 89 04/17/2022 0427   BUN 18 04/17/2022 0427   CREATININE 0.58 04/17/2022 0427   CREATININE 0.69 04/12/2022 1022   CREATININE 0.76 05/24/2020 1414   CALCIUM 8.8 (L) 04/17/2022 0427   GFRNONAA >60 04/17/2022 0427   GFRNONAA >60 04/12/2022 1022   GFRNONAA 73 05/24/2020 1414   GFRAA 84  05/24/2020 1414    BNP    Component Value Date/Time   BNP 332.0 (H) 01/01/2022 0554     Imaging:  CT ABDOMEN PELVIS W CONTRAST  Result Date: 04/17/2022 CLINICAL DATA:  Restaging non-small cell lung cancer. * Tracking Code: BO * EXAM: CT CHEST, ABDOMEN, AND PELVIS WITH CONTRAST TECHNIQUE: Multidetector CT imaging of the chest, abdomen and pelvis was performed following the standard protocol during bolus administration of intravenous contrast. RADIATION DOSE REDUCTION: This exam was performed according to the departmental dose-optimization program which includes automated exposure control, adjustment of the mA and/or kV according to patient size and/or use of iterative reconstruction technique. CONTRAST:  111mL OMNIPAQUE IOHEXOL 300 MG/ML  SOLN COMPARISON:  02/27/2022 FINDINGS: CT CHEST FINDINGS Cardiovascular: Heart size appears within normal limits. Aortic atherosclerosis and coronary artery calcifications. No pericardial effusion. Mediastinum/Nodes: No enlarged axillary or supraclavicular lymph nodes. Previous index subcarinal lymph node measures 0.6 cm, image 25/2. Formally 1.2 cm. Index right hilar lymph node measures 1 cm, image 24/2. Formally 1.1 cm. Previously characterized FDG avid partially calcified right thyroid nodule is unchanged measuring 1.5 cm, image 4/2. Trachea and esophagus are unremarkable. Lungs/Pleura: Unchanged small left pleural effusion. Bandlike area of consolidation is identified within the periphery of the left lower lobe in the distribution of the previously referenced pulmonary nodule which may represent changes secondary to external beam radiation. The underlying index nodule measures 2.9 x 1.1 cm, image 36/2. On the previous exam this measured 2.9 x 1.6 cm. Index nodule within the superior segment of left lower lobe measures  4 mm, image 42/7. Stable from previous exam. Adjacent nodule measures 4 mm, image 43/7. Also unchanged. 7 mm nodule within the apical segment of the  right upper lobe is unchanged, image 22/7. Similar appearance of peripheral and subpleural interstitial thickening and consolidation within the posterolateral left upper lobe, image 35/7. New interstitial thickening is identified within the apical segment of the right upper lobe and posterior right lower lobe. There is moderate to severe changes of emphysema. Similar appearance of subpleural consolidation within the posterolateral left upper lobe, image 35/7. New patchy interstitial thickening within the apical segment of the right upper lobe is identified. Musculoskeletal: Unchanged appearance of sclerotic metastasis involving the T3 vertebral body, image 128/6. No signs of new or progressive disease within the bony thorax. CT ABDOMEN PELVIS FINDINGS Hepatobiliary: Unchanged appearance of multiple liver cysts. No suspicious liver lesions identified. Gallstone. No gallbladder wall thickening or pericholecystic inflammation. Pancreas: Unremarkable. No pancreatic ductal dilatation or surrounding inflammatory changes. Spleen: Normal in size without focal abnormality. Adrenals/Urinary Tract: Normal adrenal glands. Small cyst within posterior cortex of the upper pole of left kidney is unchanged measuring 1.4 cm. No follow-up imaging recommended. No nephrolithiasis or hydronephrosis. Urinary bladder is unremarkable. Stomach/Bowel: No bowel wall thickening, inflammation, or distension. Vascular/Lymphatic: Aortic atherosclerosis. Upper abdominal vascularity appears patent. No signs of abdominopelvic adenopathy. Reproductive: Status post hysterectomy. No adnexal masses. Other: Status post right inguinal herniorrhaphy. No free fluid or fluid collections identified. Musculoskeletal: Mixed lytic and sclerotic lesion within the superior right acetabulum is again noted, image 53/5. This is stable when compared with the prior exam. Sclerotic lesion involving the right ischium measures 1.4 cm, image 109/2. Unchanged from the prior  study. Stable small sclerotic lesion in the left sacrum measuring 6 mm, image 82/2. IMPRESSION: 1. Interval decrease in size of index subcarinal and right hilar lymph nodes. 2. Bandlike area of consolidation within the periphery of the left lower lobe in the distribution of the previously referenced pulmonary nodule may represent changes secondary to external beam radiation. The underlying index nodule measures 2.9 x 1.1 cm. Previously the lesion measured 2.9 x 1.6 cm. 3. 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 4. Stable appearance of small left pleural effusion. 5. New interstitial thickening within the apical segment of the right upper lobe and posterior right lower lobe. Findings are nonspecific and may be inflammatory or infectious in etiology. 6. Stable appearance of FDG avid partially calcified right thyroid nodule. Recommend thyroid US and biopsy (ref: J Am Coll Radiol. 2015 Feb;12(2): 143-50). 7. Gallstone. 8.  Aortic Atherosclerosis (ICD10-I70.0). Electronically Signed   By: Kerby Moors M.D.   On: 04/17/2022 13:26   CT CHEST W CONTRAST  Result Date: 04/17/2022 CLINICAL DATA:  Restaging non-small cell lung cancer. * Tracking Code: BO * EXAM: CT CHEST, ABDOMEN, AND PELVIS WITH CONTRAST TECHNIQUE: Multidetector CT imaging of the chest, abdomen and pelvis was performed following the standard protocol during bolus administration of intravenous contrast. RADIATION DOSE REDUCTION: This exam was performed according to the departmental dose-optimization program which includes automated exposure control, adjustment of the mA and/or kV according to patient size and/or use of iterative reconstruction technique. CONTRAST:  157mL OMNIPAQUE IOHEXOL 300 MG/ML  SOLN COMPARISON:  02/27/2022 FINDINGS: CT CHEST FINDINGS Cardiovascular: Heart size appears within normal limits. Aortic atherosclerosis and coronary artery calcifications. No  pericardial effusion. Mediastinum/Nodes: No enlarged axillary or supraclavicular lymph nodes. Previous index subcarinal lymph node measures 0.6 cm, image  25/2. Formally 1.2 cm. Index right hilar lymph node measures 1 cm, image 24/2. Formally 1.1 cm. Previously characterized FDG avid partially calcified right thyroid nodule is unchanged measuring 1.5 cm, image 4/2. Trachea and esophagus are unremarkable. Lungs/Pleura: Unchanged small left pleural effusion. Bandlike area of consolidation is identified within the periphery of the left lower lobe in the distribution of the previously referenced pulmonary nodule which may represent changes secondary to external beam radiation. The underlying index nodule measures 2.9 x 1.1 cm, image 36/2. On the previous exam this measured 2.9 x 1.6 cm. Index nodule within the superior segment of left lower lobe measures 4 mm, image 42/7. Stable from previous exam. Adjacent nodule measures 4 mm, image 43/7. Also unchanged. 7 mm nodule within the apical segment of the right upper lobe is unchanged, image 22/7. Similar appearance of peripheral and subpleural interstitial thickening and consolidation within the posterolateral left upper lobe, image 35/7. New interstitial thickening is identified within the apical segment of the right upper lobe and posterior right lower lobe. There is moderate to severe changes of emphysema. Similar appearance of subpleural consolidation within the posterolateral left upper lobe, image 35/7. New patchy interstitial thickening within the apical segment of the right upper lobe is identified. Musculoskeletal: Unchanged appearance of sclerotic metastasis involving the T3 vertebral body, image 128/6. No signs of new or progressive disease within the bony thorax. CT ABDOMEN PELVIS FINDINGS Hepatobiliary: Unchanged appearance of multiple liver cysts. No suspicious liver lesions identified. Gallstone. No gallbladder wall thickening or pericholecystic inflammation.  Pancreas: Unremarkable. No pancreatic ductal dilatation or surrounding inflammatory changes. Spleen: Normal in size without focal abnormality. Adrenals/Urinary Tract: Normal adrenal glands. Small cyst within posterior cortex of the upper pole of left kidney is unchanged measuring 1.4 cm. No follow-up imaging recommended. No nephrolithiasis or hydronephrosis. Urinary bladder is unremarkable. Stomach/Bowel: No bowel wall thickening, inflammation, or distension. Vascular/Lymphatic: Aortic atherosclerosis. Upper abdominal vascularity appears patent. No signs of abdominopelvic adenopathy. Reproductive: Status post hysterectomy. No adnexal masses. Other: Status post right inguinal herniorrhaphy. No free fluid or fluid collections identified. Musculoskeletal: Mixed lytic and sclerotic lesion within the superior right acetabulum is again noted, image 53/5. This is stable when compared with the prior exam. Sclerotic lesion involving the right ischium measures 1.4 cm, image 109/2. Unchanged from the prior study. Stable small sclerotic lesion in the left sacrum measuring 6 mm, image 82/2. IMPRESSION: 1. Interval decrease in size of index subcarinal and right hilar lymph nodes. 2. Bandlike area of consolidation within the periphery of the left lower lobe in the distribution of the previously referenced pulmonary nodule may represent changes secondary to external beam radiation. The underlying index nodule measures 2.9 x 1.1 cm. Previously the lesion measured 2.9 x 1.6 cm. 3. 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 4. Stable appearance of small left pleural effusion. 5. New interstitial thickening within the apical segment of the right upper lobe and posterior right lower lobe. Findings are nonspecific and may be inflammatory or infectious in etiology. 6. Stable appearance of FDG avid partially calcified right thyroid nodule. Recommend thyroid US  and biopsy (ref: J Am Coll Radiol. 2015 Feb;12(2): 143-50). 7. Gallstone. 8.  Aortic Atherosclerosis (ICD10-I70.0). Electronically Signed   By: Kerby Moors M.D.   On: 04/17/2022 13:26   DG Chest Port 1 View  Result Date: 04/15/2022 CLINICAL DATA:  Shortness of breath EXAM: PORTABLE CHEST 1 VIEW COMPARISON:  Previous studies including the examination  of 04/12/2022 FINDINGS: Cardiac size is within normal limits. Thoracic aorta is tortuous and ectatic. Increased interstitial markings are seen in both lungs, more so on the left side. There is haziness in the left lower lung fields, possibly due to layering pleural effusion. There is interval improvement in aeration in right lower lung field. There are no new focal infiltrates. IMPRESSION: Increased interstitial markings are seen in the periphery of both lungs, possibly suggesting scarring. Haziness in left mid and left lower lung fields may be due to layering pleural effusion and underlying atelectasis/pneumonia. There is interval improvement in aeration of right lower lung fields suggesting resolving interstitial edema. Electronically Signed   By: Elmer Picker M.D.   On: 04/15/2022 15:58   DG Chest 2 View  Result Date: 04/14/2022 CLINICAL DATA:  Lung cancer, increased cough. EXAM: CHEST - 2 VIEW COMPARISON:  Chest x-rays dated 03/22/2022 and 01/01/2022. Chest CT dated 02/27/2022. FINDINGS: Heart size and mediastinal contours are stable. Prominent interstitial markings are again seen bilaterally, LEFT greater than RIGHT, not significantly changed compared to the most recent chest x-ray of 03/22/2022 but new/increased on the LEFT compared to the earlier chest x-ray of 01/01/2022. No new lung findings compared to the most recent chest x-ray. No acute-appearing osseous abnormality. Osseous metastasis at T3 vertebral body described on chest CT report of 02/27/2022. IMPRESSION: Prominent interstitial markings bilaterally, LEFT greater than RIGHT, not  significantly changed compared to the most recent chest x-ray of 03/22/2022 but new/increased on the LEFT compared to the earlier chest x-ray of 01/01/2022. Findings on the LEFT could represent interstitial edema related to CHF/volume overload or lymphangitic spread of malignancy superimposed on chronic interstitial lung disease. Electronically Signed   By: Franki Cabot M.D.   On: 04/14/2022 12:17    CARBOplatin (PARAPLATIN) 380 mg in sodium chloride 0.9 % 100 mL chemo infusion     Date Action Dose Route User   Discharged on 04/18/2022   Admitted on 04/15/2022   03/01/2022 1257 Infusion Verify (none) Intravenous Belva Chimes, RN   03/01/2022 1236 Rate/Dose Change (none) Intravenous Belva Chimes, RN   03/01/2022 1236 New Bag/Given 380 mg Intravenous Belva Chimes, RN      cyanocobalamin (VITAMIN B12) injection 1,000 mcg     Date Action Dose Route User   Discharged on 04/18/2022   Admitted on 04/15/2022   04/12/2022 1231 Given 1,000 mcg Intramuscular (Right Deltoid) Belva Chimes, RN      dexamethasone (DECADRON) 10 mg in sodium chloride 0.9 % 50 mL IVPB     Date Action Dose Route User   Discharged on 04/18/2022   Admitted on 04/15/2022   03/01/2022 1105 Rate/Dose Change (none) Intravenous Belva Chimes, RN   03/01/2022 1105 New Bag/Given 10 mg Intravenous Belva Chimes, RN      fosaprepitant (EMEND) 150 mg in sodium chloride 0.9 % 145 mL IVPB     Date Action Dose Route User   Discharged on 04/18/2022   Admitted on 04/15/2022   03/01/2022 1127 Rate/Dose Change (none) Intravenous Belva Chimes, RN   03/01/2022 1127 Rate/Dose Change (none) Intravenous Belva Chimes, RN   03/01/2022 1106 Rate/Dose Change (none) Intravenous Belva Chimes, RN   03/01/2022 1106 New Bag/Given 150 mg Intravenous Belva Chimes, RN      palonosetron (ALOXI) injection 0.25 mg     Date Action Dose Route User   Discharged on 04/18/2022   Admitted on 04/15/2022   03/01/2022 1051 Given 0.25 mg  Intravenous Belva Chimes, RN      pembrolizumab [facility]) 200 mg in sodium chloride 0.9 % 50 mL chemo infusion     Date Action Dose Route User   Discharged on 04/18/2022   Admitted on 04/15/2022   03/01/2022 1205 Rate/Dose Change (none) Intravenous Belva Chimes, RN   03/01/2022 1205 Rate/Dose Change (none) Intravenous Belva Chimes, RN   03/01/2022 1134 Rate/Dose Change (none) Intravenous Belva Chimes, RN   03/01/2022 1134 New Bag/Given 200 mg Intravenous Belva Chimes, RN      pembrolizumab [facility]) 200 mg in sodium chloride 0.9 % 50 mL chemo infusion     Date Action Dose Route User   Discharged on 04/18/2022   Admitted on 04/15/2022   03/22/2022 1317 Rate/Dose Change (none) Intravenous Rafael Bihari, RN   03/22/2022 1316 Rate/Dose Change (none) Intravenous Rafael Bihari, RN   03/22/2022 1246 Rate/Dose Change (none) Intravenous Rafael Bihari, RN   03/22/2022 1246 New Bag/Given 200 mg Intravenous Rafael Bihari, RN      pembrolizumab [facility]) 200 mg in sodium chloride 0.9 % 50 mL chemo infusion     Date Action Dose Route User   Discharged on 04/18/2022   Admitted on 04/15/2022   04/12/2022 1336 Rate/Dose Change (none) Intravenous Belva Chimes, RN   04/12/2022 1336 Rate/Dose Change (none) Intravenous Belva Chimes, RN   04/12/2022 1306 Rate/Dose Change (none) Intravenous Belva Chimes, RN   04/12/2022 1305 New Bag/Given 200 mg Intravenous Ishmael Holter, RN      PEMEtrexed (ALIMTA) 900 mg in sodium chloride 0.9 % 100 mL chemo infusion     Date Action Dose Route User   Discharged on 04/18/2022   Admitted on 04/15/2022   03/01/2022 1219 Infusion Verify (none) Intravenous Belva Chimes, RN   03/01/2022 1219 Rate/Dose Change (none) Intravenous Belva Chimes, RN   03/01/2022 1219 New Bag/Given 900 mg Intravenous Belva Chimes, RN      PEMEtrexed (ALIMTA) 900 mg in sodium chloride 0.9 % 100 mL chemo infusion     Date Action Dose Route User   Discharged  on 04/18/2022   Admitted on 04/15/2022   03/22/2022 1339 Rate/Dose Change (none) Intravenous Rafael Bihari, RN   03/22/2022 1339 Rate/Dose Change (none) Intravenous Rafael Bihari, RN   03/22/2022 1328 New Bag/Given 900 mg Intravenous Rafael Bihari, RN      PEMEtrexed (ALIMTA) 900 mg in sodium chloride 0.9 % 100 mL chemo infusion     Date Action Dose Route User   Discharged on 04/18/2022   Admitted on 04/15/2022   04/12/2022 1348 Infusion Verify (none) Intravenous Belva Chimes, RN   04/12/2022 1348 Rate/Dose Change (none) Intravenous Belva Chimes, RN   04/12/2022 1347 New Bag/Given 900 mg Intravenous Belva Chimes, RN      prochlorperazine (COMPAZINE) tablet 10 mg     Date Action Dose Route User   Discharged on 04/18/2022   Admitted on 04/15/2022   03/22/2022 1238 Given 10 mg Oral Rafael Bihari, RN      prochlorperazine (COMPAZINE) tablet 10 mg     Date Action Dose Route User   Discharged on 04/18/2022   Admitted on 04/15/2022   04/12/2022 1230 Given 10 mg Oral Belva Chimes, RN      0.9 %  sodium chloride infusion     Date Action Dose Route User   Discharged on 04/18/2022   Admitted on 04/15/2022  03/01/2022 1318 Rate/Dose Change (none) Intravenous Belva Chimes, RN   03/01/2022 1311 Rate/Dose Change (none) Intravenous Belva Chimes, RN   03/01/2022 1234 Infusion Verify (none) Intravenous Belva Chimes, RN   03/01/2022 1233 Rate/Dose Change (none) Intravenous Belva Chimes, RN   03/01/2022 1231 Rate/Dose Change (none) Intravenous Belva Chimes, RN      0.9 %  sodium chloride infusion     Date Action Dose Route User   Discharged on 04/18/2022   Admitted on 04/15/2022   03/22/2022 1344 Rate/Dose Change (none) Intravenous Rafael Bihari, RN   03/22/2022 1342 Rate/Dose Change (none) Intravenous Rafael Bihari, RN   03/22/2022 1329 Rate/Dose Change (none) Intravenous Rafael Bihari, RN   03/22/2022 1324 Rate/Dose Change (none) Intravenous Rafael Bihari, RN   03/22/2022 1231 New Bag/Given (none) Intravenous Rafael Bihari, RN      0.9 %  sodium chloride infusion     Date Action Dose Route User   Discharged on 04/18/2022   Admitted on 04/15/2022   04/12/2022 1406 Rate/Dose Change (none) Intravenous Belva Chimes, RN   04/12/2022 1359 Rate/Dose Change (none) Intravenous Belva Chimes, RN   04/12/2022 1345 Rate/Dose Change (none) Intravenous Belva Chimes, RN   04/12/2022 1341 Rate/Dose Change (none) Intravenous Belva Chimes, RN   04/12/2022 1304 New Bag/Given (none) Intravenous Ishmael Holter, RN          Latest Ref Rng & Units 10/09/2013    3:44 PM  PFT Results  FVC-Pre L 2.62   FVC-Predicted Pre % 91   FVC-Post L 2.68   FVC-Predicted Post % 93   Pre FEV1/FVC % % 71   Post FEV1/FCV % % 70   FEV1-Pre L 1.86   FEV1-Predicted Pre % 86   FEV1-Post L 1.89   DLCO uncorrected ml/min/mmHg 12.53   DLCO UNC% % 48   DLVA Predicted % 67   TLC L 4.13   TLC % Predicted % 79   RV % Predicted % 64     No results found for: "NITRICOXIDE"      Assessment & Plan:   COPD with acute exacerbation (HCC) Slow to resolve exacerbation related to recent.  She is still experiencing a persistent cough, slightly worse from her baseline but unchanged compared to when she was in the hospital.  She does tend to respond well to prednisone.  Recommended that we treat her with taper.  If cough worsens or symptoms do not continue to improve, she will need repeat chest imaging to ensure pneumonia is resolving. She has elevated peripheral eosinophils; recommended we trial an step up in her inhaler regimen to [place] from [place]. Continue PRN albuterol.   Patient Instructions  Stop Bevespi. Start Breztri 2 puffs Twice daily. Brush tongue and rinse mouth afterwards Continue Albuterol inhaler 2 puffs every 6 hours as needed for shortness of breath or wheezing. Notify if symptoms persist despite rescue inhaler/neb use.  Continue allegra 1 tab  daily for allergies Continue singulair 1 tab At bedtime  Continue protonix 1 tab daily  Continue supplemental oxygen 4 lpm POC with activity and continuous at night for goal >88-90%  Benzonatate 1 capsule Three times a day for cough Phenergan DM cough syrup 5 mL every 6 hours as needed for cough Prednisone taper. 4 tabs for 2 days, then 3 tabs for 2 days, 2 tabs for 2 days, then 1 tab for 2 days, then stop. Take in AM with food.  Mucinex [phone] mg Twice daily for cough/congestion  Follow up with oncology as scheduled.   Follow up in 5 weeks with Dr. Lamonte Sakai and repeat chest x ray. If symptoms do not improve or worsen, please contact office for sooner follow up or seek emergency care.     Chronic respiratory failure (HCC) Stable O2 requirement. She is able to maintain on 4 lpm. Goal >88-90%  Adenocarcinoma of right lung, stage 4 (HCC) Currently undergoing treatment with palliative chemotherapy, Keytruda, and radiation. Therapies restart next week. Nodules/mass are stable. She is struggling with chronic fatigue, likely from treatments. Trying to work on her stamina/strength. Follow up with oncology as scheduled.   Left lower lobe pulmonary infiltrate Complete IV abx with rocephin and azithromycin then discharged on PO doxy. Clinically stable; mild improvement. Cough is no better but no worse. See above. Advised her that if she were to worsen or doesn't start to see some improvement, to come back in for repeat imaging. Otherwise, we will reassess in 5-6 weeks to ensure resolution.     I spent 41 minutes of dedicated to the care of this patient on the date of this encounter to include pre-visit review of records, face-to-face time with the patient discussing conditions above, post visit ordering of testing, clinical documentation with the electronic health record, making appropriate referrals as documented, and communicating necessary findings to members of the patients care  team.  Clayton Bibles, NP 04/26/2022  Pt aware and understands NP's role.

## 2022-04-26 NOTE — Assessment & Plan Note (Signed)
Currently undergoing treatment with palliative chemotherapy, Keytruda, and radiation. Therapies restart next week. Nodules/mass are stable. She is struggling with chronic fatigue, likely from treatments. Trying to work on her stamina/strength. Follow up with oncology as scheduled.

## 2022-04-26 NOTE — Assessment & Plan Note (Signed)
Slow to resolve exacerbation related to recent.  She is still experiencing a persistent cough, slightly worse from her baseline but unchanged compared to when she was in the hospital.  She does tend to respond well to prednisone.  Recommended that we treat her with taper.  If cough worsens or symptoms do not continue to improve, she will need repeat chest imaging to ensure pneumonia is resolving. She has elevated peripheral eosinophils; recommended we trial an step up in her inhaler regimen to [place] from [place]. Continue PRN albuterol.   Patient Instructions  Stop Bevespi. Start Breztri 2 puffs Twice daily. Brush tongue and rinse mouth afterwards Continue Albuterol inhaler 2 puffs every 6 hours as needed for shortness of breath or wheezing. Notify if symptoms persist despite rescue inhaler/neb use.  Continue allegra 1 tab daily for allergies Continue singulair 1 tab At bedtime  Continue protonix 1 tab daily  Continue supplemental oxygen 4 lpm POC with activity and continuous at night for goal >88-90%  Benzonatate 1 capsule Three times a day for cough Phenergan DM cough syrup 5 mL every 6 hours as needed for cough Prednisone taper. 4 tabs for 2 days, then 3 tabs for 2 days, 2 tabs for 2 days, then 1 tab for 2 days, then stop. Take in AM with food.  Mucinex [phone] mg Twice daily for cough/congestion  Follow up with oncology as scheduled.   Follow up in 5 weeks with Dr. Lamonte Sakai and repeat chest x ray. If symptoms do not improve or worsen, please contact office for sooner follow up or seek emergency care.

## 2022-04-27 ENCOUNTER — Other Ambulatory Visit (INDEPENDENT_AMBULATORY_CARE_PROVIDER_SITE_OTHER): Payer: Medicare HMO

## 2022-04-27 ENCOUNTER — Telehealth: Payer: Self-pay | Admitting: Family Medicine

## 2022-04-27 ENCOUNTER — Other Ambulatory Visit (HOSPITAL_COMMUNITY): Payer: Self-pay

## 2022-04-27 ENCOUNTER — Telehealth: Payer: Self-pay

## 2022-04-27 DIAGNOSIS — J9611 Chronic respiratory failure with hypoxia: Secondary | ICD-10-CM | POA: Diagnosis not present

## 2022-04-27 DIAGNOSIS — M6281 Muscle weakness (generalized): Secondary | ICD-10-CM | POA: Diagnosis not present

## 2022-04-27 DIAGNOSIS — I10 Essential (primary) hypertension: Secondary | ICD-10-CM

## 2022-04-27 DIAGNOSIS — E782 Mixed hyperlipidemia: Secondary | ICD-10-CM

## 2022-04-27 DIAGNOSIS — R2689 Other abnormalities of gait and mobility: Secondary | ICD-10-CM | POA: Diagnosis not present

## 2022-04-27 DIAGNOSIS — J439 Emphysema, unspecified: Secondary | ICD-10-CM

## 2022-04-27 DIAGNOSIS — C3491 Malignant neoplasm of unspecified part of right bronchus or lung: Secondary | ICD-10-CM | POA: Diagnosis not present

## 2022-04-27 LAB — LIPID PANEL
Cholesterol: 92 mg/dL (ref 0–200)
HDL: 41.8 mg/dL (ref >39.00)
LDL Cholesterol: 27 mg/dL (ref 0–99)
NonHDL: 49.72
Total CHOL/HDL Ratio: 2
Triglycerides: 115 mg/dL (ref 0.0–149.0)
VLDL: 23 mg/dL (ref 0.0–40.0)

## 2022-04-27 LAB — CBC WITH DIFFERENTIAL/PLATELET
Basophils Absolute: 0 10*3/uL (ref 0.0–0.1)
Basophils Relative: 0.2 % (ref 0.0–3.0)
Eosinophils Absolute: 0.3 10*3/uL (ref 0.0–0.7)
Eosinophils Relative: 4.8 % (ref 0.0–5.0)
HCT: 27.1 % — ABNORMAL LOW (ref 36.0–46.0)
Hemoglobin: 8.8 g/dL — ABNORMAL LOW (ref 12.0–15.0)
Lymphocytes Relative: 3.5 % — ABNORMAL LOW (ref 12.0–46.0)
Lymphs Abs: 0.2 10*3/uL — ABNORMAL LOW (ref 0.7–4.0)
MCHC: 32.6 g/dL (ref 30.0–36.0)
MCV: 106.4 fl — ABNORMAL HIGH (ref 78.0–100.0)
Monocytes Absolute: 1 10*3/uL (ref 0.1–1.0)
Monocytes Relative: 15.9 % — ABNORMAL HIGH (ref 3.0–12.0)
Neutro Abs: 4.9 10*3/uL (ref 1.4–7.7)
Neutrophils Relative %: 75.6 % (ref 43.0–77.0)
Platelets: 186 10*3/uL (ref 150.0–400.0)
RBC: 2.55 Mil/uL — ABNORMAL LOW (ref 3.87–5.11)
RDW: 15.9 % — ABNORMAL HIGH (ref 11.5–15.5)
WBC: 6.5 10*3/uL (ref 4.0–10.5)

## 2022-04-27 LAB — COMPREHENSIVE METABOLIC PANEL
ALT: 11 U/L (ref 0–35)
AST: 20 U/L (ref 0–37)
Albumin: 2.8 g/dL — ABNORMAL LOW (ref 3.5–5.2)
Alkaline Phosphatase: 49 U/L (ref 39–117)
BUN: 10 mg/dL (ref 6–23)
CO2: 31 mEq/L (ref 19–32)
Calcium: 8.8 mg/dL (ref 8.4–10.5)
Chloride: 103 mEq/L (ref 96–112)
Creatinine, Ser: 0.68 mg/dL (ref 0.40–1.20)
GFR: 79.51 mL/min (ref >60.00)
Glucose, Bld: 122 mg/dL — ABNORMAL HIGH (ref 70–99)
Potassium: 3.2 mEq/L — ABNORMAL LOW (ref 3.5–5.1)
Sodium: 140 mEq/L (ref 135–145)
Total Bilirubin: 0.3 mg/dL (ref 0.2–1.2)
Total Protein: 6.5 g/dL (ref 6.0–8.3)

## 2022-04-27 LAB — MAGNESIUM: Magnesium: 1.5 mg/dL (ref 1.5–2.5)

## 2022-04-27 NOTE — Telephone Encounter (Signed)
Caller is Caryl Comes, Medical sales representative with [employer]   Caller states: -Visited patient in home for evaluation - Noticed patient's O2 Saturation was 75% while she was using portable oxygen tank  - Upon using stationary tank her oxygen saturation returned back to normal range, reaching about 90%. - With portable tank patient has to inhale in order to get O2 but with stationary tank air is constantly pushed in    If there are any questions, Mikle Bosworth can be reached at [phone].

## 2022-04-27 NOTE — Telephone Encounter (Signed)
Katie, please advise on pt's message regarding her inhaler and mucinex. Thanks.

## 2022-04-27 NOTE — Telephone Encounter (Signed)
Patient Advocate Encounter   Received notification that prior authorization for Breztri Aerosphere 160-9-4.8MCG/ACT aerosol is required/requested.    PA submitted on 04/27/2022 to [facility] via [place] was not needed the copay was 161.00 pt is aware. They do have a Patient Assistance zero co-pay for commercial paying patient only.  She has Medicare and can be enrolled in Patient assistance from manufacture if doctor still wants pt to have this inhaler

## 2022-04-28 ENCOUNTER — Telehealth: Payer: Self-pay | Admitting: Family Medicine

## 2022-04-28 DIAGNOSIS — M6281 Muscle weakness (generalized): Secondary | ICD-10-CM | POA: Diagnosis not present

## 2022-04-28 DIAGNOSIS — R2689 Other abnormalities of gait and mobility: Secondary | ICD-10-CM | POA: Diagnosis not present

## 2022-04-28 DIAGNOSIS — C3491 Malignant neoplasm of unspecified part of right bronchus or lung: Secondary | ICD-10-CM | POA: Diagnosis not present

## 2022-04-28 MED ORDER — PULMICORT FLEXHALER 180 MCG/ACT IN AEPB: 1.0000 | INHALATION_SPRAY | Freq: Two times a day (BID) | RESPIRATORY_TRACT | 5 refills | Status: AC

## 2022-04-28 NOTE — Telephone Encounter (Signed)
She can continue Bevespi. I have sent Pulmicort inhaler 1 puff Twice daily for her to use as well. Using both will accomplish the same goal as the [facility]. Says this is covered by insurance. She should brush her tongue and rinse her mouth out well afterwards.  Do not use the Mucinex DM and the cough syrup I sent. They both have dextromethorphan in them. She can either continue to use the one he bought over the counter or she can use the cough syrup plus over the counter guaifenesin (plain mucinex). Thanks!

## 2022-04-28 NOTE — Telephone Encounter (Signed)
Holly-Physical Therapist states Patient's O2 stats dropped to 68% today during treatment-Patient was on 4 liters of portable oxygen-it took 5 minutes and 18 seconds for O2 stats to return to 90%. While testing at rest-O2 stats tend to be around 70% to 80%

## 2022-04-28 NOTE — Telephone Encounter (Signed)
FYI

## 2022-04-28 NOTE — Telephone Encounter (Signed)
I am not sure of the accuracy of this-she was seen by pulmonology and pulse ox was 91% just 2 days ago and 92% at my office on the 12th-if she is really running now low she would be profoundly short of breath and likely would not stay conscious-if she is short of breath would recommend immediate follow-up/emergency room

## 2022-05-01 DIAGNOSIS — R2689 Other abnormalities of gait and mobility: Secondary | ICD-10-CM | POA: Diagnosis not present

## 2022-05-01 DIAGNOSIS — C3491 Malignant neoplasm of unspecified part of right bronchus or lung: Secondary | ICD-10-CM | POA: Diagnosis not present

## 2022-05-01 DIAGNOSIS — M6281 Muscle weakness (generalized): Secondary | ICD-10-CM | POA: Diagnosis not present

## 2022-05-02 ENCOUNTER — Telehealth: Payer: Self-pay | Admitting: Family Medicine

## 2022-05-02 DIAGNOSIS — M6281 Muscle weakness (generalized): Secondary | ICD-10-CM | POA: Diagnosis not present

## 2022-05-02 DIAGNOSIS — C3491 Malignant neoplasm of unspecified part of right bronchus or lung: Secondary | ICD-10-CM | POA: Diagnosis not present

## 2022-05-02 DIAGNOSIS — J9611 Chronic respiratory failure with hypoxia: Secondary | ICD-10-CM | POA: Diagnosis not present

## 2022-05-02 DIAGNOSIS — R2689 Other abnormalities of gait and mobility: Secondary | ICD-10-CM | POA: Diagnosis not present

## 2022-05-02 DIAGNOSIS — J441 Chronic obstructive pulmonary disease with (acute) exacerbation: Secondary | ICD-10-CM | POA: Diagnosis not present

## 2022-05-02 NOTE — Telephone Encounter (Signed)
Mychart message sent to patient with PCP recommendations. I will also call this morning to make sure patient has seen message

## 2022-05-02 NOTE — Telephone Encounter (Signed)
  Ms Sheila Shields from [facility] rehab [phone]. Wants to know when Does Dr hunter want to be contacted for 02 stats and if they should contact pulmonologist. Ms Sheila Shields would like a call back .

## 2022-05-02 NOTE — Telephone Encounter (Signed)
Ideally we want her above 88%- with that being said it may be hard to get a good reading on her- I would say if she is feeling more signfiicantly short of breath and oxygen levels low probably worth a visit with [place] or pulmonology

## 2022-05-02 NOTE — Telephone Encounter (Signed)
Patient has pulse ox at home and cont checks O2 at home. Aware to seek help immediately if stats drop and do not come back up

## 2022-05-03 ENCOUNTER — Inpatient Hospital Stay: Payer: Medicare HMO

## 2022-05-03 ENCOUNTER — Encounter: Payer: Self-pay | Admitting: Internal Medicine

## 2022-05-03 ENCOUNTER — Inpatient Hospital Stay (HOSPITAL_BASED_OUTPATIENT_CLINIC_OR_DEPARTMENT_OTHER): Payer: Medicare HMO | Admitting: Internal Medicine

## 2022-05-03 VITALS — BP 106/56 | Temp 97.6°F | Resp 17 | Ht 65.0 in | Wt 153.0 lb

## 2022-05-03 VITALS — BP 139/53 | HR 71 | Temp 97.6°F | Resp 18

## 2022-05-03 DIAGNOSIS — Z86010 Personal history of colonic polyps: Secondary | ICD-10-CM | POA: Diagnosis not present

## 2022-05-03 DIAGNOSIS — Z79899 Other long term (current) drug therapy: Secondary | ICD-10-CM | POA: Diagnosis not present

## 2022-05-03 DIAGNOSIS — R69 Illness, unspecified: Secondary | ICD-10-CM | POA: Diagnosis not present

## 2022-05-03 DIAGNOSIS — I10 Essential (primary) hypertension: Secondary | ICD-10-CM | POA: Diagnosis not present

## 2022-05-03 DIAGNOSIS — C3432 Malignant neoplasm of lower lobe, left bronchus or lung: Secondary | ICD-10-CM | POA: Diagnosis not present

## 2022-05-03 DIAGNOSIS — Z79810 Long term (current) use of selective estrogen receptor modulators (SERMs): Secondary | ICD-10-CM | POA: Diagnosis not present

## 2022-05-03 DIAGNOSIS — Z5112 Encounter for antineoplastic immunotherapy: Secondary | ICD-10-CM | POA: Diagnosis present

## 2022-05-03 DIAGNOSIS — J90 Pleural effusion, not elsewhere classified: Secondary | ICD-10-CM | POA: Diagnosis not present

## 2022-05-03 DIAGNOSIS — C3491 Malignant neoplasm of unspecified part of right bronchus or lung: Secondary | ICD-10-CM

## 2022-05-03 DIAGNOSIS — C7951 Secondary malignant neoplasm of bone: Secondary | ICD-10-CM | POA: Diagnosis not present

## 2022-05-03 DIAGNOSIS — E785 Hyperlipidemia, unspecified: Secondary | ICD-10-CM | POA: Diagnosis not present

## 2022-05-03 DIAGNOSIS — R918 Other nonspecific abnormal finding of lung field: Secondary | ICD-10-CM

## 2022-05-03 DIAGNOSIS — Z8673 Personal history of transient ischemic attack (TIA), and cerebral infarction without residual deficits: Secondary | ICD-10-CM | POA: Diagnosis not present

## 2022-05-03 DIAGNOSIS — J449 Chronic obstructive pulmonary disease, unspecified: Secondary | ICD-10-CM | POA: Diagnosis not present

## 2022-05-03 DIAGNOSIS — F1011 Alcohol abuse, in remission: Secondary | ICD-10-CM | POA: Diagnosis not present

## 2022-05-03 DIAGNOSIS — Z7982 Long term (current) use of aspirin: Secondary | ICD-10-CM | POA: Diagnosis not present

## 2022-05-03 DIAGNOSIS — Z5111 Encounter for antineoplastic chemotherapy: Secondary | ICD-10-CM | POA: Diagnosis not present

## 2022-05-03 DIAGNOSIS — R059 Cough, unspecified: Secondary | ICD-10-CM | POA: Diagnosis not present

## 2022-05-03 DIAGNOSIS — Z7902 Long term (current) use of antithrombotics/antiplatelets: Secondary | ICD-10-CM | POA: Diagnosis not present

## 2022-05-03 LAB — CBC WITH DIFFERENTIAL (CANCER CENTER ONLY)
Abs Immature Granulocytes: 0.02 10*3/uL (ref 0.00–0.07)
Basophils Absolute: 0 10*3/uL (ref 0.0–0.1)
Basophils Relative: 0 %
Eosinophils Absolute: 0.2 10*3/uL (ref 0.0–0.5)
Eosinophils Relative: 2 %
HCT: 28.4 % — ABNORMAL LOW (ref 36.0–46.0)
Hemoglobin: 9.2 g/dL — ABNORMAL LOW (ref 12.0–15.0)
Immature Granulocytes: 0 %
Lymphocytes Relative: 7 %
Lymphs Abs: 0.6 10*3/uL — ABNORMAL LOW (ref 0.7–4.0)
MCH: 35.4 pg — ABNORMAL HIGH (ref 26.0–34.0)
MCHC: 32.4 g/dL (ref 30.0–36.0)
MCV: 109.2 fL — ABNORMAL HIGH (ref 80.0–100.0)
Monocytes Absolute: 1 10*3/uL (ref 0.1–1.0)
Monocytes Relative: 12 %
Neutro Abs: 6.4 10*3/uL (ref 1.7–7.7)
Neutrophils Relative %: 79 %
Platelet Count: 277 10*3/uL (ref 150–400)
RBC: 2.6 MIL/uL — ABNORMAL LOW (ref 3.87–5.11)
RDW: 14.9 % (ref 11.5–15.5)
WBC Count: 8.2 10*3/uL (ref 4.0–10.5)
nRBC: 0 % (ref 0.0–0.2)

## 2022-05-03 LAB — CMP (CANCER CENTER ONLY)
ALT: 9 U/L (ref 0–44)
AST: 15 U/L (ref 15–41)
Albumin: 3 g/dL — ABNORMAL LOW (ref 3.5–5.0)
Alkaline Phosphatase: 46 U/L (ref 38–126)
Anion gap: 6 (ref 5–15)
BUN: 12 mg/dL (ref 8–23)
CO2: 30 mmol/L (ref 22–32)
Calcium: 8.8 mg/dL — ABNORMAL LOW (ref 8.9–10.3)
Chloride: 106 mmol/L (ref 98–111)
Creatinine: 0.74 mg/dL (ref 0.44–1.00)
GFR, Estimated: >60 mL/min (ref >60)
Glucose, Bld: 101 mg/dL — ABNORMAL HIGH (ref 70–99)
Potassium: 3.3 mmol/L — ABNORMAL LOW (ref 3.5–5.1)
Sodium: 142 mmol/L (ref 135–145)
Total Bilirubin: 0.3 mg/dL (ref 0.3–1.2)
Total Protein: 6.3 g/dL — ABNORMAL LOW (ref 6.5–8.1)

## 2022-05-03 LAB — TSH: TSH: 2.298 u[IU]/mL (ref 0.350–4.500)

## 2022-05-03 MED ORDER — SODIUM CHLORIDE 0.9 % IV SOLN
500.0000 mg/m2 | Freq: Once | INTRAVENOUS | Status: AC
  Administered 2022-05-03: 900 mg via INTRAVENOUS
  Filled 2022-05-03: qty 20

## 2022-05-03 MED ORDER — PROCHLORPERAZINE MALEATE 10 MG PO TABS
10.0000 mg | ORAL_TABLET | Freq: Once | ORAL | Status: AC
  Administered 2022-05-03: 10 mg via ORAL
  Filled 2022-05-03: qty 1

## 2022-05-03 MED ORDER — SODIUM CHLORIDE 0.9 % IV SOLN: Freq: Once | INTRAVENOUS | Status: AC

## 2022-05-03 MED ORDER — SODIUM CHLORIDE 0.9 % IV SOLN
200.0000 mg | Freq: Once | INTRAVENOUS | Status: AC
  Administered 2022-05-03: 200 mg via INTRAVENOUS
  Filled 2022-05-03: qty 200

## 2022-05-03 NOTE — Progress Notes (Signed)
[facility] Telephone:([phone]) [phone]   Fax:([phone]  OFFICE PROGRESS NOTE  [address], [place] [address]  DIAGNOSIS:  1) Stage IV (T1c, N3, M1C) non-small cell lung cancer, adenocarcinoma presented with left lower lobe lung nodule in addition to bilateral mediastinal lymphadenopathy as well as bilateral pulmonary nodules and multiple metastatic bone lesions diagnosed in May 2023. 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   Guardant 360: No actionable mutations   Foundation one: Positive for KRASG12C   PDL1: 70%   PRIOR THERAPY:  1) status post left breast lumpectomy followed by radioactive seed localization and adjuvant radiotherapy and currently on tamoxifen. 2) Palliative radiation to the painful metastatic bone lesions (hip and below shoulder blade) under the care of Dr. Lisbeth Renshaw.   CURRENT THERAPY: Palliative systemic chemotherapy with carboplatin for an AUC of 5, Alimta 500 mg/m2 and Keytruda 200 mg IV every 3 weeks.  First dose 12/28/2021.  Status post 6 cycles.  Starting from cycle #5 she will be on maintenance treatment with Alimta and Keytruda every 3 weeks.   INTERVAL HISTORY: Sheila Shields 87 y.o. female returns to the clinic today for follow-up visit accompanied by her husband.  The patient is feeling fine today with no concerning complaints except for the baseline shortness of breath and she is currently on home oxygen.  She also has some weakness and wobbling of her lower extremities.  She denied having any chest pain, cough or hemoptysis.  She has no nausea, vomiting, diarrhea or constipation.  She has no headache or visual changes.  She denied having any recent weight loss or night sweats.  She had repeat CT scan of the  chest, abdomen and pelvis performed few weeks ago and she is here for evaluation and discussion of her scan results before starting cycle #7.   MEDICAL HISTORY: Past Medical History:  Diagnosis Date   Alcoholism in remission ([facility]) 08/27/2007   Anemia    Arthritis    OA   Breast cancer ([facility] [place])    left breast   COPD 12/17/2006   Diverticulosis 03/27/2007   GERD 05/01/2007   HYPERGLYCEMIA 08/27/2007   HYPERLIPIDEMIA 12/17/2006   HYPERTENSION 12/17/2006   Mood disorder ([place])    hx anxiety and depression. meds short term during life transition   Pneumonia    AS A CHILD   Rectal polyp 03/27/2007   adenoma   SOB (shortness of breath) on exertion    per patient due to having COPD   TIA (transient ischemic attack)     ALLERGIES:  is allergic to alcohol, cheese, dilaudid [hydromorphone hcl], phenytoin sodium extended, and diclofenac sodium.  MEDICATIONS:  Current Outpatient Medications  Medication Sig Dispense Refill   albuterol (PROAIR HFA) 108 (90 Base) MCG/ACT inhaler INHALE 2 PUFFS EVERY 4 HOURS AS NEEDED FOR COUGHING SPELLS (Patient taking differently: Inhale 2 puffs into the lungs every 4 (four) hours as needed ("for coughing spells").) 18 g 5   aspirin 325 MG tablet Take 1 tablet (325 mg total) by mouth daily. 30 tablet 3   azelastine (ASTELIN) 0.1 % nasal spray Place 2 sprays into both nostrils 2 (two) times daily.     benzonatate (TESSALON) 200 MG capsule Take 1 capsule (200 mg total) by mouth 3 (three)  times daily as needed for cough. (Patient taking differently: Take 200 mg by mouth See admin instructions. Take 200 mg by mouth in the morning and an additional 200 mg two times a day as needed for coughing) 90 capsule 2   Budeson-Glycopyrrol-Formoterol (BREZTRI AEROSPHERE) 160-9-4.8 MCG/ACT AERO Inhale 2 puffs into the lungs in the morning and at bedtime. 10.7 g 6   budesonide (PULMICORT FLEXHALER) 180 MCG/ACT inhaler Inhale 1 puff into the lungs 2 (two) times daily. 1 each 5    Cholecalciferol (VITAMIN D3) 50 MCG (2000 UT) TABS Take 2,000 Units by mouth in the morning.     clopidogrel (PLAVIX) 75 MG tablet Take 0.5 tablets (37.5 mg total) by mouth daily. 46 tablet 3   Coenzyme Q10 300 MG CAPS Take 300 mg by mouth daily.     fexofenadine (ALLEGRA) 180 MG tablet Take 180 mg by mouth daily.     folic acid (FOLVITE) 1 MG tablet TAKE 1 TABLET(1 MG) BY MOUTH DAILY (Patient taking differently: Take 1 mg by mouth daily.) 30 tablet 2   furosemide (LASIX) 20 MG tablet 1 tablet p.o. daily only as needed for swelling or worsening shortness of breath. (Patient taking differently: Take 20 mg by mouth daily as needed ("only as needed for swelling or worsening shortness of breath").) 5 tablet 0   lactose free nutrition (BOOST) LIQD Take 237 mLs by mouth daily.     montelukast (SINGULAIR) 10 MG tablet TAKE 1 TABLET(10 MG) BY MOUTH AT BEDTIME 30 tablet 5   Multiple Vitamin (MULTIVITAMIN WITH MINERALS) TABS tablet Take 1 tablet by mouth daily with breakfast.     pantoprazole (PROTONIX) 40 MG tablet TAKE 1 TABLET(40 MG) BY MOUTH DAILY 30 tablet 5   potassium chloride SA (KLOR-CON M) 20 MEQ tablet Take 20 mEq by mouth every other day.     predniSONE (DELTASONE) 10 MG tablet 4 tabs for 2 days, then 3 tabs for 2 days, 2 tabs for 2 days, then 1 tab for 2 days, then stop 20 tablet 0   promethazine-dextromethorphan (PROMETHAZINE-DM) 6.25-15 MG/5ML syrup Take 5 mLs by mouth 4 (four) times daily as needed for cough. 118 mL 0   rosuvastatin (CRESTOR) 20 MG tablet TAKE 1 TABLET(20 MG) BY MOUTH DAILY 90 tablet 3   tamoxifen (NOLVADEX) 20 MG tablet Take 1 tablet (20 mg total) by mouth daily. 90 tablet 3   traMADol (ULTRAM) 50 MG tablet TAKE 1 TABLET BY MOUTH EVERY 8 HOURS FOR UP TO 5 DAYS AS NEEDED FOR PAIN. DO NOT DRIVE FOR 8 HOURS AFTER TAKING (Patient taking differently: Take 50 mg by mouth in the morning and at bedtime.) 45 tablet 5   traZODone (DESYREL) 50 MG tablet TAKE 1/2- 1 TABLET BY MOUTH EVERY  NIGHT AT BEDTIME AS NEEDED SLEEP (Patient taking differently: Take 50 mg by mouth at bedtime.) 30 tablet 3   TYLENOL 8 HOUR ARTHRITIS PAIN 650 MG CR tablet Take 1,300 mg by mouth in the morning and at bedtime.     vitamin B-12 (CYANOCOBALAMIN) 1000 MCG tablet Take 1,000 mcg by mouth daily.     No current facility-administered medications for this visit.    SURGICAL HISTORY:  Past Surgical History:  Procedure Laterality Date   ABDOMINAL HYSTERECTOMY  1978   fibroma   APPENDECTOMY  2002   BREAST EXCISIONAL BIOPSY Right 2003   negative   BREAST LUMPECTOMY WITH RADIOACTIVE SEED LOCALIZATION Left 03/23/2020   Procedure: LEFT BREAST LUMPECTOMY WITH RADIOACTIVE SEED LOCALIZATION;  Surgeon:  [name], MD;  Location: [facility];  Service: General;  Laterality: Left;  RNFA   CARPAL TUNNEL RELEASE Left 2010 or 2011   CATARACT EXTRACTION W/ INTRAOCULAR LENS  IMPLANT, BILATERAL Bilateral    COLONOSCOPY W/ POLYPECTOMY  03/27/2007; n7/19/2012   2008: 4 mm adenoma, diverticulosis 2012: ileitis ? NSAID - likely, 2-3 mm cecal polyp LYMPHOID FOLLICLE, diverticulosis   ESOPHAGOGASTRODUODENOSCOPY  01/26/2011   reflux esophagitis, colonscopy done also   HAMMER TOE SURGERY  2008   left foot   HERNIA REPAIR Right 1996?   IR ANGIO INTRA EXTRACRAN SEL COM CAROTID INNOMINATE BILAT MOD SED  10/25/2021   IR ANGIO INTRA EXTRACRAN SEL COM CAROTID INNOMINATE UNI R MOD SED  11/28/2018   IR ANGIO INTRA EXTRACRAN SEL COM CAROTID INNOMINATE UNI R MOD SED  03/17/2021   IR ANGIO INTRA EXTRACRAN SEL INTERNAL CAROTID UNI R MOD SED  12/30/2018   IR ANGIO VERTEBRAL SEL SUBCLAVIAN INNOMINATE UNI R MOD SED  11/28/2018   IR ANGIO VERTEBRAL SEL SUBCLAVIAN INNOMINATE UNI R MOD SED  10/26/2021   IR ANGIO VERTEBRAL SEL VERTEBRAL UNI R MOD SED  03/18/2021   IR ANGIOGRAM FOLLOW UP STUDY  12/30/2018   IR TRANSCATH/EMBOLIZ  12/30/2018   IR US GUIDE VASC ACCESS RIGHT  11/28/2018   IR US GUIDE VASC ACCESS RIGHT  03/17/2021   ORIF ANKLE FRACTURE Right  12/22/2014   Procedure: OPEN REDUCTION INTERNAL FIXATION (ORIF) ANKLE FRACTURE;  Surgeon: Susa Day, MD;  Location: [facility];  Service: Orthopedics;  Laterality: Right;   RADIOLOGY WITH ANESTHESIA N/A 12/30/2018   Procedure: RADIOLOGY WITH ANESTHESIA;  Surgeon: Luanne Bras, MD;  Location: [facility];  Service: Radiology;  Laterality: N/A;   SHOULDER OPEN ROTATOR CUFF REPAIR Left 03/06/2013   Procedure: LEFT ROTATOR CUFF REPAIR, SUBACROMIAL DECOMPRESSION, PATCH GRAFT, MANIPULATION UNDER ANESTHESIA;  Surgeon: Johnn Hai, MD;  Location: [facility];  Service: Orthopedics;  Laterality: Left;   TONSILLECTOMY      REVIEW OF SYSTEMS:  Constitutional: positive for fatigue Eyes: negative Ears, nose, mouth, throat, and face: negative Respiratory: positive for cough and dyspnea on exertion Cardiovascular: negative Gastrointestinal: negative Genitourinary:negative Integument/breast: negative Hematologic/lymphatic: negative Musculoskeletal:positive for muscle weakness Neurological: negative Behavioral/Psych: negative Endocrine: negative Allergic/Immunologic: negative   PHYSICAL EXAMINATION: General appearance: alert, cooperative, fatigued, and no distress Head: Normocephalic, without obvious abnormality, atraumatic Neck: no adenopathy, no JVD, supple, symmetrical, trachea midline, and thyroid not enlarged, symmetric, no tenderness/mass/nodules Lymph nodes: Cervical, supraclavicular, and axillary nodes normal. Resp: clear to auscultation bilaterally Back: symmetric, no curvature. ROM normal. No CVA tenderness. Cardio: regular rate and rhythm, S1, S2 normal, no murmur, click, rub or gallop GI: soft, non-tender; bowel sounds normal; no masses,  no organomegaly Extremities: extremities normal, atraumatic, no cyanosis or edema Neurologic: Alert and oriented X 3, normal strength and tone. Normal symmetric reflexes. Normal coordination and gait  ECOG PERFORMANCE STATUS: 1 - Symptomatic but completely  ambulatory  Blood pressure (!) 106/56, temperature 97.6 F (36.4 C), temperature source Oral, resp. rate 17, height '5\' 5"'  (1.651 m), weight 153 lb (69.4 kg), SpO2 90 %.  LABORATORY DATA: Lab Results  Component Value Date   WBC 8.2 05/03/2022   HGB 9.2 (L) 05/03/2022   HCT 28.4 (L) 05/03/2022   MCV 109.2 (H) 05/03/2022   PLT 277 05/03/2022      Chemistry      Component Value Date/Time   NA 140 04/27/2022 1055   K 3.2 (L) 04/27/2022 1055   CL 103 04/27/2022  1055   CO2 31 04/27/2022 1055   BUN 10 04/27/2022 1055   CREATININE 0.68 04/27/2022 1055   CREATININE 0.69 04/12/2022 1022   CREATININE 0.76 05/24/2020 1414      Component Value Date/Time   CALCIUM 8.8 04/27/2022 1055   ALKPHOS 49 04/27/2022 1055   AST 20 04/27/2022 1055   AST 18 04/12/2022 1022   ALT 11 04/27/2022 1055   ALT 7 04/12/2022 1022   BILITOT 0.3 04/27/2022 1055   BILITOT 0.4 04/12/2022 1022       RADIOGRAPHIC STUDIES: CT ABDOMEN PELVIS W CONTRAST  Result Date: 04/17/2022 CLINICAL DATA:  Restaging non-small cell lung cancer. * Tracking Code: BO * EXAM: CT CHEST, ABDOMEN, AND PELVIS WITH CONTRAST TECHNIQUE: Multidetector CT imaging of the chest, abdomen and pelvis was performed following the standard protocol during bolus administration of intravenous contrast. RADIATION DOSE REDUCTION: This exam was performed according to the departmental dose-optimization program which includes automated exposure control, adjustment of the mA and/or kV according to patient size and/or use of iterative reconstruction technique. CONTRAST:  144m OMNIPAQUE IOHEXOL 300 MG/ML  SOLN COMPARISON:  02/27/2022 FINDINGS: CT CHEST FINDINGS Cardiovascular: Heart size appears within normal limits. Aortic atherosclerosis and coronary artery calcifications. No pericardial effusion. Mediastinum/Nodes: No enlarged axillary or supraclavicular lymph nodes. Previous index subcarinal lymph node measures 0.6 cm, image 25/2. Formally 1.2 cm. Index  right hilar lymph node measures 1 cm, image 24/2. Formally 1.1 cm. Previously characterized FDG avid partially calcified right thyroid nodule is unchanged measuring 1.5 cm, image 4/2. Trachea and esophagus are unremarkable. Lungs/Pleura: Unchanged small left pleural effusion. Bandlike area of consolidation is identified within the periphery of the left lower lobe in the distribution of the previously referenced pulmonary nodule which may represent changes secondary to external beam radiation. The underlying index nodule measures 2.9 x 1.1 cm, image 36/2. On the previous exam this measured 2.9 x 1.6 cm. Index nodule within the superior segment of left lower lobe measures 4 mm, image 42/7. Stable from previous exam. Adjacent nodule measures 4 mm, image 43/7. Also unchanged. 7 mm nodule within the apical segment of the right upper lobe is unchanged, image 22/7. Similar appearance of peripheral and subpleural interstitial thickening and consolidation within the posterolateral left upper lobe, image 35/7. New interstitial thickening is identified within the apical segment of the right upper lobe and posterior right lower lobe. There is moderate to severe changes of emphysema. Similar appearance of subpleural consolidation within the posterolateral left upper lobe, image 35/7. New patchy interstitial thickening within the apical segment of the right upper lobe is identified. Musculoskeletal: Unchanged appearance of sclerotic metastasis involving the T3 vertebral body, image 128/6. No signs of new or progressive disease within the bony thorax. CT ABDOMEN PELVIS FINDINGS Hepatobiliary: Unchanged appearance of multiple liver cysts. No suspicious liver lesions identified. Gallstone. No gallbladder wall thickening or pericholecystic inflammation. Pancreas: Unremarkable. No pancreatic ductal dilatation or surrounding inflammatory changes. Spleen: Normal in size without focal abnormality. Adrenals/Urinary Tract: Normal adrenal  glands. Small cyst within posterior cortex of the upper pole of left kidney is unchanged measuring 1.4 cm. No follow-up imaging recommended. No nephrolithiasis or hydronephrosis. Urinary bladder is unremarkable. Stomach/Bowel: No bowel wall thickening, inflammation, or distension. Vascular/Lymphatic: Aortic atherosclerosis. Upper abdominal vascularity appears patent. No signs of abdominopelvic adenopathy. Reproductive: Status post hysterectomy. No adnexal masses. Other: Status post right inguinal herniorrhaphy. No free fluid or fluid collections identified. Musculoskeletal: Mixed lytic and sclerotic lesion within the superior right acetabulum is again noted,  image 53/5. This is stable when compared with the prior exam. Sclerotic lesion involving the right ischium measures 1.4 cm, image 109/2. Unchanged from the prior study. Stable small sclerotic lesion in the left sacrum measuring 6 mm, image 82/2. IMPRESSION: 1. Interval decrease in size of index subcarinal and right hilar lymph nodes. 2. Bandlike area of consolidation within the periphery of the left lower lobe in the distribution of the previously referenced pulmonary nodule may represent changes secondary to external beam radiation. The underlying index nodule measures 2.9 x 1.1 cm. Previously the lesion measured 2.9 x 1.6 cm. 3. 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 4. Stable appearance of small left pleural effusion. 5. New interstitial thickening within the apical segment of the right upper lobe and posterior right lower lobe. Findings are nonspecific and may be inflammatory or infectious in etiology. 6. Stable appearance of FDG avid partially calcified right thyroid nodule. Recommend thyroid US and biopsy (ref: J Am Coll Radiol. 2015 Feb;12(2): 143-50). 7. Gallstone. 8.  Aortic Atherosclerosis (ICD10-I70.0). Electronically Signed   By: Kerby Moors M.D.   On: 04/17/2022  13:26   CT CHEST W CONTRAST  Result Date: 04/17/2022 CLINICAL DATA:  Restaging non-small cell lung cancer. * Tracking Code: BO * EXAM: CT CHEST, ABDOMEN, AND PELVIS WITH CONTRAST TECHNIQUE: Multidetector CT imaging of the chest, abdomen and pelvis was performed following the standard protocol during bolus administration of intravenous contrast. RADIATION DOSE REDUCTION: This exam was performed according to the departmental dose-optimization program which includes automated exposure control, adjustment of the mA and/or kV according to patient size and/or use of iterative reconstruction technique. CONTRAST:  116m OMNIPAQUE IOHEXOL 300 MG/ML  SOLN COMPARISON:  02/27/2022 FINDINGS: CT CHEST FINDINGS Cardiovascular: Heart size appears within normal limits. Aortic atherosclerosis and coronary artery calcifications. No pericardial effusion. Mediastinum/Nodes: No enlarged axillary or supraclavicular lymph nodes. Previous index subcarinal lymph node measures 0.6 cm, image 25/2. Formally 1.2 cm. Index right hilar lymph node measures 1 cm, image 24/2. Formally 1.1 cm. Previously characterized FDG avid partially calcified right thyroid nodule is unchanged measuring 1.5 cm, image 4/2. Trachea and esophagus are unremarkable. Lungs/Pleura: Unchanged small left pleural effusion. Bandlike area of consolidation is identified within the periphery of the left lower lobe in the distribution of the previously referenced pulmonary nodule which may represent changes secondary to external beam radiation. The underlying index nodule measures 2.9 x 1.1 cm, image 36/2. On the previous exam this measured 2.9 x 1.6 cm. Index nodule within the superior segment of left lower lobe measures 4 mm, image 42/7. Stable from previous exam. Adjacent nodule measures 4 mm, image 43/7. Also unchanged. 7 mm nodule within the apical segment of the right upper lobe is unchanged, image 22/7. Similar appearance of peripheral and subpleural interstitial  thickening and consolidation within the posterolateral left upper lobe, image 35/7. New interstitial thickening is identified within the apical segment of the right upper lobe and posterior right lower lobe. There is moderate to severe changes of emphysema. Similar appearance of subpleural consolidation within the posterolateral left upper lobe, image 35/7. New patchy interstitial thickening within the apical segment of the right upper lobe is identified. Musculoskeletal: Unchanged appearance of sclerotic metastasis involving the T3 vertebral body, image 128/6. No signs of new or progressive disease within the bony thorax. CT ABDOMEN PELVIS FINDINGS Hepatobiliary: Unchanged appearance of multiple liver cysts. No suspicious liver lesions identified. Gallstone. No gallbladder wall thickening or pericholecystic  inflammation. Pancreas: Unremarkable. No pancreatic ductal dilatation or surrounding inflammatory changes. Spleen: Normal in size without focal abnormality. Adrenals/Urinary Tract: Normal adrenal glands. Small cyst within posterior cortex of the upper pole of left kidney is unchanged measuring 1.4 cm. No follow-up imaging recommended. No nephrolithiasis or hydronephrosis. Urinary bladder is unremarkable. Stomach/Bowel: No bowel wall thickening, inflammation, or distension. Vascular/Lymphatic: Aortic atherosclerosis. Upper abdominal vascularity appears patent. No signs of abdominopelvic adenopathy. Reproductive: Status post hysterectomy. No adnexal masses. Other: Status post right inguinal herniorrhaphy. No free fluid or fluid collections identified. Musculoskeletal: Mixed lytic and sclerotic lesion within the superior right acetabulum is again noted, image 53/5. This is stable when compared with the prior exam. Sclerotic lesion involving the right ischium measures 1.4 cm, image 109/2. Unchanged from the prior study. Stable small sclerotic lesion in the left sacrum measuring 6 mm, image 82/2. IMPRESSION: 1.  Interval decrease in size of index subcarinal and right hilar lymph nodes. 2. Bandlike area of consolidation within the periphery of the left lower lobe in the distribution of the previously referenced pulmonary nodule may represent changes secondary to external beam radiation. The underlying index nodule measures 2.9 x 1.1 cm. Previously the lesion measured 2.9 x 1.6 cm. 3. 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 4. Stable appearance of small left pleural effusion. 5. New interstitial thickening within the apical segment of the right upper lobe and posterior right lower lobe. Findings are nonspecific and may be inflammatory or infectious in etiology. 6. Stable appearance of FDG avid partially calcified right thyroid nodule. Recommend thyroid US and biopsy (ref: J Am Coll Radiol. 2015 Feb;12(2): 143-50). 7. Gallstone. 8.  Aortic Atherosclerosis (ICD10-I70.0). Electronically Signed   By: Kerby Moors M.D.   On: 04/17/2022 13:26   DG Chest Port 1 View  Result Date: 04/15/2022 CLINICAL DATA:  Shortness of breath EXAM: PORTABLE CHEST 1 VIEW COMPARISON:  Previous studies including the examination of 04/12/2022 FINDINGS: Cardiac size is within normal limits. Thoracic aorta is tortuous and ectatic. Increased interstitial markings are seen in both lungs, more so on the left side. There is haziness in the left lower lung fields, possibly due to layering pleural effusion. There is interval improvement in aeration in right lower lung field. There are no new focal infiltrates. IMPRESSION: Increased interstitial markings are seen in the periphery of both lungs, possibly suggesting scarring. Haziness in left mid and left lower lung fields may be due to layering pleural effusion and underlying atelectasis/pneumonia. There is interval improvement in aeration of right lower lung fields suggesting resolving interstitial edema. Electronically  Signed   By: Elmer Picker M.D.   On: 04/15/2022 15:58   DG Chest 2 View  Result Date: 04/14/2022 CLINICAL DATA:  Lung cancer, increased cough. EXAM: CHEST - 2 VIEW COMPARISON:  Chest x-rays dated 03/22/2022 and 01/01/2022. Chest CT dated 02/27/2022. FINDINGS: Heart size and mediastinal contours are stable. Prominent interstitial markings are again seen bilaterally, LEFT greater than RIGHT, not significantly changed compared to the most recent chest x-ray of 03/22/2022 but new/increased on the LEFT compared to the earlier chest x-ray of 01/01/2022. No new lung findings compared to the most recent chest x-ray. No acute-appearing osseous abnormality. Osseous metastasis at T3 vertebral body described on chest CT report of 02/27/2022. IMPRESSION: Prominent interstitial markings bilaterally, LEFT greater than RIGHT, not significantly changed compared to the most recent chest x-ray of 03/22/2022 but new/increased on the LEFT compared to the earlier chest  x-ray of 01/01/2022. Findings on the LEFT could represent interstitial edema related to CHF/volume overload or lymphangitic spread of malignancy superimposed on chronic interstitial lung disease. Electronically Signed   By: Franki Cabot M.D.   On: 04/14/2022 12:17    ASSESSMENT AND PLAN: This is a very pleasant 87 years old white female diagnosed with stage IV (T1c, N3, M1C) non-small cell lung cancer, adenocarcinoma presented with left upper lobe lung nodule in addition to bilateral mediastinal lymphadenopathy as well as bilateral pulmonary nodules and multiple metastatic bone disease diagnosed in May 2023.  The patient also has a history of stage Ia breast cancer in August 2021 status post lumpectomy followed by radioactive seed localization and adjuvant radiotherapy and currently on tamoxifen. Molecular studies showed no actionable mutations except for positive KRAS G12C mutation and PD-L1 expression of 70%. She underwent palliative radiotherapy to  several areas of metastatic bone disease including the hip and shoulder blade under the care of Dr. Lisbeth Renshaw The patient is currently undergoing systemic chemotherapy with carboplatin for AUC of 5, Alimta 500 Mg/M2 and Keytruda 200 Mg IV every 3 weeks status post 6 cycles.  Starting from cycle #5 she will be on maintenance treatment with Alimta and Keytruda every 3 weeks The patient has been tolerating her treatment well except for mild fatigue and the baseline shortness of breath secondary to COPD. The patient has been tolerating this treatment well with no concerning adverse effect except for mild fatigue. She had repeat CT scan of the chest, abdomen and pelvis performed recently.  I personally and independently reviewed the scan and discussed the result with the patient and her husband. Her scan showed improvement of her disease. I recommended for her to proceed with cycle #7 today with maintenance Alimta and Keytruda. She will come back for follow-up visit in 3 weeks for evaluation before starting the next cycle of her treatment. For the COPD, she will continue her follow-up visit and evaluation by Dr. Lamonte Sakai. The patient was advised to call immediately if she has any other concerning symptoms in the interval. The patient voices understanding of current disease status and treatment options and is in agreement with the current care plan.  All questions were answered. The patient knows to call the clinic with any problems, questions or concerns. We can certainly see the patient much sooner if necessary. The total time spent in the appointment was 30 minutes.  Disclaimer: This note was dictated with voice recognition software. Similar sounding words can inadvertently be transcribed and may not be corrected upon review.

## 2022-05-03 NOTE — Telephone Encounter (Signed)
Spoke to [facility] and relayed PCP recommendations to them. Offer f/u appt with PCP, declined at this time. Advised to call back for significant SOB, fatigue, dizziness

## 2022-05-03 NOTE — Patient Instructions (Signed)
[facility]  Discharge Instructions: Thank you for choosing [facility] to provide your oncology and hematology care.   If you have a lab appointment with the [facility], please go directly to the [facility] and check in at the registration area.   Wear comfortable clothing and clothing appropriate for easy access to any Portacath or PICC line.   We strive to give you quality time with your provider. You may need to reschedule your appointment if you arrive late (15 or more minutes).  Arriving late affects you and other patients whose appointments are after yours.  Also, if you miss three or more appointments without notifying the office, you may be dismissed from the clinic at the provider's discretion.      For prescription refill requests, have your pharmacy contact our office and allow 72 hours for refills to be completed.    Today you received the following chemotherapy and/or immunotherapy agents: Keytruda, alimta, and carboplatin      To help prevent nausea and vomiting after your treatment, we encourage you to take your nausea medication as directed.  BELOW ARE SYMPTOMS THAT SHOULD BE REPORTED IMMEDIATELY: *FEVER GREATER THAN 100.4 F (38 C) OR HIGHER *CHILLS OR SWEATING *NAUSEA AND VOMITING THAT IS NOT CONTROLLED WITH YOUR NAUSEA MEDICATION *UNUSUAL SHORTNESS OF BREATH *UNUSUAL BRUISING OR BLEEDING *URINARY PROBLEMS (pain or burning when urinating, or frequent urination) *BOWEL PROBLEMS (unusual diarrhea, constipation, pain near the anus) TENDERNESS IN MOUTH AND THROAT WITH OR WITHOUT PRESENCE OF ULCERS (sore throat, sores in mouth, or a toothache) UNUSUAL RASH, SWELLING OR PAIN  UNUSUAL VAGINAL DISCHARGE OR ITCHING   Items with * indicate a potential emergency and should be followed up as soon as possible or go to the Emergency Department if any problems should occur.  Please show the CHEMOTHERAPY ALERT CARD or IMMUNOTHERAPY  ALERT CARD at check-in to the Emergency Department and triage nurse.  Should you have questions after your visit or need to cancel or reschedule your appointment, please contact [facility]  Dept: [phone]  and follow the prompts.  Office hours are 8:00 a.m. to 4:30 p.m. Monday - Friday. Please note that voicemails left after 4:00 p.m. may not be returned until the following business day.  We are closed weekends and major holidays. You have access to a nurse at all times for urgent questions. Please call the main number to the clinic Dept: [phone] and follow the prompts.   For any non-urgent questions, you may also contact your provider using MyChart. We now offer e-Visits for anyone 30 and older to request care online for non-urgent symptoms. For details visit [email or web address].   Also download the MyChart app! Go to the app store, search "MyChart", open the app, select [facility], and log in with your MyChart username and password.  Masks are optional in the cancer centers. If you would like for your care team to wear a mask while they are taking care of you, please let them know. You may have one support person who is at least 85 years old accompany you for your appointments.

## 2022-05-04 DIAGNOSIS — R2689 Other abnormalities of gait and mobility: Secondary | ICD-10-CM | POA: Diagnosis not present

## 2022-05-04 DIAGNOSIS — M6281 Muscle weakness (generalized): Secondary | ICD-10-CM | POA: Diagnosis not present

## 2022-05-04 DIAGNOSIS — C3491 Malignant neoplasm of unspecified part of right bronchus or lung: Secondary | ICD-10-CM | POA: Diagnosis not present

## 2022-05-05 DIAGNOSIS — C3491 Malignant neoplasm of unspecified part of right bronchus or lung: Secondary | ICD-10-CM | POA: Diagnosis not present

## 2022-05-05 DIAGNOSIS — M6281 Muscle weakness (generalized): Secondary | ICD-10-CM | POA: Diagnosis not present

## 2022-05-05 DIAGNOSIS — R2689 Other abnormalities of gait and mobility: Secondary | ICD-10-CM | POA: Diagnosis not present

## 2022-05-08 DIAGNOSIS — M6281 Muscle weakness (generalized): Secondary | ICD-10-CM | POA: Diagnosis not present

## 2022-05-08 DIAGNOSIS — R2689 Other abnormalities of gait and mobility: Secondary | ICD-10-CM | POA: Diagnosis not present

## 2022-05-08 DIAGNOSIS — C3491 Malignant neoplasm of unspecified part of right bronchus or lung: Secondary | ICD-10-CM | POA: Diagnosis not present

## 2022-05-09 DIAGNOSIS — R2689 Other abnormalities of gait and mobility: Secondary | ICD-10-CM | POA: Diagnosis not present

## 2022-05-09 DIAGNOSIS — M6281 Muscle weakness (generalized): Secondary | ICD-10-CM | POA: Diagnosis not present

## 2022-05-09 DIAGNOSIS — C3491 Malignant neoplasm of unspecified part of right bronchus or lung: Secondary | ICD-10-CM | POA: Diagnosis not present

## 2022-05-10 ENCOUNTER — Other Ambulatory Visit: Payer: Medicare HMO

## 2022-05-10 DIAGNOSIS — R278 Other lack of coordination: Secondary | ICD-10-CM | POA: Diagnosis not present

## 2022-05-10 DIAGNOSIS — J189 Pneumonia, unspecified organism: Secondary | ICD-10-CM | POA: Diagnosis not present

## 2022-05-10 DIAGNOSIS — M6281 Muscle weakness (generalized): Secondary | ICD-10-CM | POA: Diagnosis not present

## 2022-05-10 DIAGNOSIS — R2689 Other abnormalities of gait and mobility: Secondary | ICD-10-CM | POA: Diagnosis not present

## 2022-05-10 DIAGNOSIS — C44329 Squamous cell carcinoma of skin of other parts of face: Secondary | ICD-10-CM | POA: Diagnosis not present

## 2022-05-10 DIAGNOSIS — C3490 Malignant neoplasm of unspecified part of unspecified bronchus or lung: Secondary | ICD-10-CM | POA: Diagnosis not present

## 2022-05-11 DIAGNOSIS — C3490 Malignant neoplasm of unspecified part of unspecified bronchus or lung: Secondary | ICD-10-CM | POA: Diagnosis not present

## 2022-05-11 DIAGNOSIS — M6281 Muscle weakness (generalized): Secondary | ICD-10-CM | POA: Diagnosis not present

## 2022-05-11 DIAGNOSIS — R278 Other lack of coordination: Secondary | ICD-10-CM | POA: Diagnosis not present

## 2022-05-11 DIAGNOSIS — R2689 Other abnormalities of gait and mobility: Secondary | ICD-10-CM | POA: Diagnosis not present

## 2022-05-11 DIAGNOSIS — J189 Pneumonia, unspecified organism: Secondary | ICD-10-CM | POA: Diagnosis not present

## 2022-05-15 DIAGNOSIS — R278 Other lack of coordination: Secondary | ICD-10-CM | POA: Diagnosis not present

## 2022-05-15 DIAGNOSIS — R2689 Other abnormalities of gait and mobility: Secondary | ICD-10-CM | POA: Diagnosis not present

## 2022-05-15 DIAGNOSIS — C3490 Malignant neoplasm of unspecified part of unspecified bronchus or lung: Secondary | ICD-10-CM | POA: Diagnosis not present

## 2022-05-15 DIAGNOSIS — J189 Pneumonia, unspecified organism: Secondary | ICD-10-CM | POA: Diagnosis not present

## 2022-05-15 DIAGNOSIS — M6281 Muscle weakness (generalized): Secondary | ICD-10-CM | POA: Diagnosis not present

## 2022-05-16 ENCOUNTER — Other Ambulatory Visit: Payer: Medicare HMO

## 2022-05-17 ENCOUNTER — Other Ambulatory Visit: Payer: Medicare HMO

## 2022-05-17 DIAGNOSIS — J189 Pneumonia, unspecified organism: Secondary | ICD-10-CM | POA: Diagnosis not present

## 2022-05-17 DIAGNOSIS — R2689 Other abnormalities of gait and mobility: Secondary | ICD-10-CM | POA: Diagnosis not present

## 2022-05-17 DIAGNOSIS — R278 Other lack of coordination: Secondary | ICD-10-CM | POA: Diagnosis not present

## 2022-05-17 DIAGNOSIS — M6281 Muscle weakness (generalized): Secondary | ICD-10-CM | POA: Diagnosis not present

## 2022-05-17 DIAGNOSIS — C3490 Malignant neoplasm of unspecified part of unspecified bronchus or lung: Secondary | ICD-10-CM | POA: Diagnosis not present

## 2022-05-19 DIAGNOSIS — M6281 Muscle weakness (generalized): Secondary | ICD-10-CM | POA: Diagnosis not present

## 2022-05-19 DIAGNOSIS — J189 Pneumonia, unspecified organism: Secondary | ICD-10-CM | POA: Diagnosis not present

## 2022-05-19 DIAGNOSIS — C3490 Malignant neoplasm of unspecified part of unspecified bronchus or lung: Secondary | ICD-10-CM | POA: Diagnosis not present

## 2022-05-19 DIAGNOSIS — R278 Other lack of coordination: Secondary | ICD-10-CM | POA: Diagnosis not present

## 2022-05-19 DIAGNOSIS — R2689 Other abnormalities of gait and mobility: Secondary | ICD-10-CM | POA: Diagnosis not present

## 2022-05-22 DIAGNOSIS — C3490 Malignant neoplasm of unspecified part of unspecified bronchus or lung: Secondary | ICD-10-CM | POA: Diagnosis not present

## 2022-05-22 DIAGNOSIS — M6281 Muscle weakness (generalized): Secondary | ICD-10-CM | POA: Diagnosis not present

## 2022-05-22 DIAGNOSIS — J189 Pneumonia, unspecified organism: Secondary | ICD-10-CM | POA: Diagnosis not present

## 2022-05-22 DIAGNOSIS — R278 Other lack of coordination: Secondary | ICD-10-CM | POA: Diagnosis not present

## 2022-05-22 DIAGNOSIS — R2689 Other abnormalities of gait and mobility: Secondary | ICD-10-CM | POA: Diagnosis not present

## 2022-05-24 ENCOUNTER — Inpatient Hospital Stay: Payer: Medicare HMO

## 2022-05-24 ENCOUNTER — Telehealth: Payer: Self-pay | Admitting: Internal Medicine

## 2022-05-24 ENCOUNTER — Inpatient Hospital Stay (HOSPITAL_BASED_OUTPATIENT_CLINIC_OR_DEPARTMENT_OTHER): Payer: Medicare HMO | Admitting: Internal Medicine

## 2022-05-24 ENCOUNTER — Inpatient Hospital Stay: Payer: Medicare HMO | Attending: Nurse Practitioner

## 2022-05-24 ENCOUNTER — Encounter: Payer: Self-pay | Admitting: Internal Medicine

## 2022-05-24 VITALS — BP 135/50 | HR 78 | Temp 97.7°F | Resp 19

## 2022-05-24 VITALS — BP 102/52 | HR 102 | Temp 97.6°F | Resp 18 | Ht 65.0 in | Wt 155.0 lb

## 2022-05-24 DIAGNOSIS — I10 Essential (primary) hypertension: Secondary | ICD-10-CM | POA: Insufficient documentation

## 2022-05-24 DIAGNOSIS — R69 Illness, unspecified: Secondary | ICD-10-CM | POA: Diagnosis not present

## 2022-05-24 DIAGNOSIS — C3491 Malignant neoplasm of unspecified part of right bronchus or lung: Secondary | ICD-10-CM | POA: Diagnosis not present

## 2022-05-24 DIAGNOSIS — Z923 Personal history of irradiation: Secondary | ICD-10-CM | POA: Diagnosis not present

## 2022-05-24 DIAGNOSIS — Z79899 Other long term (current) drug therapy: Secondary | ICD-10-CM | POA: Diagnosis not present

## 2022-05-24 DIAGNOSIS — Z5111 Encounter for antineoplastic chemotherapy: Secondary | ICD-10-CM | POA: Diagnosis not present

## 2022-05-24 DIAGNOSIS — Z79810 Long term (current) use of selective estrogen receptor modulators (SERMs): Secondary | ICD-10-CM | POA: Insufficient documentation

## 2022-05-24 DIAGNOSIS — F1011 Alcohol abuse, in remission: Secondary | ICD-10-CM | POA: Diagnosis not present

## 2022-05-24 DIAGNOSIS — Z7902 Long term (current) use of antithrombotics/antiplatelets: Secondary | ICD-10-CM | POA: Insufficient documentation

## 2022-05-24 DIAGNOSIS — Z5112 Encounter for antineoplastic immunotherapy: Secondary | ICD-10-CM | POA: Diagnosis present

## 2022-05-24 DIAGNOSIS — C7951 Secondary malignant neoplasm of bone: Secondary | ICD-10-CM | POA: Insufficient documentation

## 2022-05-24 DIAGNOSIS — C3432 Malignant neoplasm of lower lobe, left bronchus or lung: Secondary | ICD-10-CM | POA: Diagnosis not present

## 2022-05-24 DIAGNOSIS — Z7951 Long term (current) use of inhaled steroids: Secondary | ICD-10-CM | POA: Insufficient documentation

## 2022-05-24 DIAGNOSIS — E785 Hyperlipidemia, unspecified: Secondary | ICD-10-CM | POA: Diagnosis not present

## 2022-05-24 DIAGNOSIS — Z8673 Personal history of transient ischemic attack (TIA), and cerebral infarction without residual deficits: Secondary | ICD-10-CM | POA: Diagnosis not present

## 2022-05-24 DIAGNOSIS — J449 Chronic obstructive pulmonary disease, unspecified: Secondary | ICD-10-CM | POA: Diagnosis not present

## 2022-05-24 DIAGNOSIS — M129 Arthropathy, unspecified: Secondary | ICD-10-CM | POA: Insufficient documentation

## 2022-05-24 DIAGNOSIS — R918 Other nonspecific abnormal finding of lung field: Secondary | ICD-10-CM

## 2022-05-24 LAB — CMP (CANCER CENTER ONLY)
ALT: 6 U/L (ref 0–44)
AST: 20 U/L (ref 15–41)
Albumin: 3 g/dL — ABNORMAL LOW (ref 3.5–5.0)
Alkaline Phosphatase: 55 U/L (ref 38–126)
Anion gap: 6 (ref 5–15)
BUN: 11 mg/dL (ref 8–23)
CO2: 29 mmol/L (ref 22–32)
Calcium: 9 mg/dL (ref 8.9–10.3)
Chloride: 106 mmol/L (ref 98–111)
Creatinine: 0.74 mg/dL (ref 0.44–1.00)
GFR, Estimated: >60 mL/min (ref >60)
Glucose, Bld: 107 mg/dL — ABNORMAL HIGH (ref 70–99)
Potassium: 3.7 mmol/L (ref 3.5–5.1)
Sodium: 141 mmol/L (ref 135–145)
Total Bilirubin: 0.4 mg/dL (ref 0.3–1.2)
Total Protein: 6.9 g/dL (ref 6.5–8.1)

## 2022-05-24 LAB — CBC WITH DIFFERENTIAL (CANCER CENTER ONLY)
Abs Immature Granulocytes: 0.02 10*3/uL (ref 0.00–0.07)
Basophils Absolute: 0.1 10*3/uL (ref 0.0–0.1)
Basophils Relative: 1 %
Eosinophils Absolute: 0.8 10*3/uL — ABNORMAL HIGH (ref 0.0–0.5)
Eosinophils Relative: 11 %
HCT: 30.1 % — ABNORMAL LOW (ref 36.0–46.0)
Hemoglobin: 9.7 g/dL — ABNORMAL LOW (ref 12.0–15.0)
Immature Granulocytes: 0 %
Lymphocytes Relative: 5 %
Lymphs Abs: 0.3 10*3/uL — ABNORMAL LOW (ref 0.7–4.0)
MCH: 35.7 pg — ABNORMAL HIGH (ref 26.0–34.0)
MCHC: 32.2 g/dL (ref 30.0–36.0)
MCV: 110.7 fL — ABNORMAL HIGH (ref 80.0–100.0)
Monocytes Absolute: 0.8 10*3/uL (ref 0.1–1.0)
Monocytes Relative: 11 %
Neutro Abs: 5 10*3/uL (ref 1.7–7.7)
Neutrophils Relative %: 72 %
Platelet Count: 237 10*3/uL (ref 150–400)
RBC: 2.72 MIL/uL — ABNORMAL LOW (ref 3.87–5.11)
RDW: 15.2 % (ref 11.5–15.5)
WBC Count: 6.9 10*3/uL (ref 4.0–10.5)
nRBC: 0 % (ref 0.0–0.2)

## 2022-05-24 LAB — TSH: TSH: 3.169 u[IU]/mL (ref 0.350–4.500)

## 2022-05-24 MED ORDER — SODIUM CHLORIDE 0.9 % IV SOLN: Freq: Once | INTRAVENOUS | Status: AC

## 2022-05-24 MED ORDER — SODIUM CHLORIDE 0.9 % IV SOLN
200.0000 mg | Freq: Once | INTRAVENOUS | Status: AC
  Administered 2022-05-24: 200 mg via INTRAVENOUS
  Filled 2022-05-24: qty 200

## 2022-05-24 MED ORDER — SODIUM CHLORIDE 0.9 % IV SOLN
500.0000 mg/m2 | Freq: Once | INTRAVENOUS | Status: AC
  Administered 2022-05-24: 900 mg via INTRAVENOUS
  Filled 2022-05-24: qty 20

## 2022-05-24 MED ORDER — PROCHLORPERAZINE MALEATE 10 MG PO TABS
10.0000 mg | ORAL_TABLET | Freq: Once | ORAL | Status: AC
  Administered 2022-05-24: 10 mg via ORAL
  Filled 2022-05-24: qty 1

## 2022-05-24 NOTE — Progress Notes (Signed)
[facility] [phone]   [phone]  OFFICE PROGRESS NOTE  [address], [place] [address]  DIAGNOSIS:  1) Stage IV (T1c, N3, M1C) non-small cell lung cancer, adenocarcinoma presented with left lower lobe lung nodule in addition to bilateral mediastinal lymphadenopathy as well as bilateral pulmonary nodules and multiple metastatic bone lesions diagnosed in May 2023. 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   Guardant 360: No actionable mutations   Foundation one: Positive for KRASG12C   PDL1: 70%   PRIOR THERAPY:  1) status post left breast lumpectomy followed by radioactive seed localization and adjuvant radiotherapy and currently on tamoxifen. 2) Palliative radiation to the painful metastatic bone lesions (hip and below shoulder blade) under the care of Dr. Lisbeth Renshaw.   CURRENT THERAPY: Palliative systemic chemotherapy with carboplatin for an AUC of 5, Alimta 500 mg/m2 and Keytruda 200 mg IV every 3 weeks.  First dose 12/28/2021.  Status post 7 cycles.  Starting from cycle #5 she will be on maintenance treatment with Alimta and Keytruda every 3 weeks.  INTERVAL HISTORY: Sheila Shields 87 y.o. female returns to the clinic today for follow-up visit accompanied by her husband.  The patient is feeling fine except for the lack of energy and the baseline shortness of breath and she is currently on home oxygen.  She has no chest pain, cough or hemoptysis.  She has no nausea, vomiting, diarrhea or constipation.  She has no headache or visual changes.  She continues to tolerate her maintenance treatment with Alimta and Keytruda fairly well.  She is here for evaluation before starting cycle #8.  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 meds short term during life transition   Pneumonia    AS A CHILD   Rectal polyp 03/27/2007   adenoma   SOB (shortness of breath) on exertion    per patient due to having COPD   TIA (transient ischemic attack)     ALLERGIES:  is allergic to alcohol, cheese, dilaudid [hydromorphone hcl], phenytoin sodium extended, and diclofenac sodium.  MEDICATIONS:  Current Outpatient Medications  Medication Sig Dispense Refill   albuterol (PROAIR HFA) 108 (90 Base) MCG/ACT inhaler INHALE 2 PUFFS EVERY 4 HOURS AS NEEDED FOR COUGHING SPELLS (Patient taking differently: Inhale 2 puffs into the lungs every 4 (four) hours as needed ("for coughing spells").) 18 g 5   aspirin 325 MG tablet Take 1 tablet (325 mg total) by mouth daily. 30 tablet 3   azelastine (ASTELIN) 0.1 % nasal spray Place 2 sprays into both nostrils 2 (two) times daily.     benzonatate (TESSALON) 200 MG capsule Take 1 capsule (200 mg total) by mouth 3 (three) times daily as needed for cough. (Patient taking differently: Take 200 mg by mouth See admin instructions. Take 200 mg by mouth in the morning and an additional 200 mg two times a day  as needed for coughing) 90 capsule 2   Budeson-Glycopyrrol-Formoterol (BREZTRI AEROSPHERE) 160-9-4.8 MCG/ACT AERO Inhale 2 puffs into the lungs in the morning and at bedtime. (Patient not taking: Reported on 05/03/2022) 10.7 g 6   budesonide (PULMICORT FLEXHALER) 180 MCG/ACT inhaler Inhale 1 puff into the lungs 2 (two) times daily. 1 each 5   Cholecalciferol (VITAMIN D3) 50 MCG (2000 UT) TABS Take 2,000 Units by mouth in the morning.     clopidogrel (PLAVIX) 75 MG tablet Take 0.5  tablets (37.5 mg total) by mouth daily. 46 tablet 3   Coenzyme Q10 300 MG CAPS Take 300 mg by mouth daily.     fexofenadine (ALLEGRA) 180 MG tablet Take 180 mg by mouth daily.     folic acid (FOLVITE) 1 MG tablet TAKE 1 TABLET(1 MG) BY MOUTH DAILY (Patient taking differently: Take 1 mg by mouth daily.) 30 tablet 2   guaiFENesin (MUCINEX) 600 MG 12 hr tablet Take 1 tablet by mouth daily.     lactose free nutrition (BOOST) LIQD Take 237 mLs by mouth daily.     montelukast (SINGULAIR) 10 MG tablet TAKE 1 TABLET(10 MG) BY MOUTH AT BEDTIME 30 tablet 5   Multiple Vitamin (MULTIVITAMIN WITH MINERALS) TABS tablet Take 1 tablet by mouth daily with breakfast.     pantoprazole (PROTONIX) 40 MG tablet TAKE 1 TABLET(40 MG) BY MOUTH DAILY 30 tablet 5   potassium chloride SA (KLOR-CON M) 20 MEQ tablet Take 20 mEq by mouth every other day.     promethazine-dextromethorphan (PROMETHAZINE-DM) 6.25-15 MG/5ML syrup Take 5 mLs by mouth 4 (four) times daily as needed for cough. (Patient not taking: Reported on 05/03/2022) 118 mL 0   rosuvastatin (CRESTOR) 20 MG tablet TAKE 1 TABLET(20 MG) BY MOUTH DAILY 90 tablet 3   tamoxifen (NOLVADEX) 20 MG tablet Take 1 tablet (20 mg total) by mouth daily. 90 tablet 3   traMADol (ULTRAM) 50 MG tablet TAKE 1 TABLET BY MOUTH EVERY 8 HOURS FOR UP TO 5 DAYS AS NEEDED FOR PAIN. DO NOT DRIVE FOR 8 HOURS AFTER TAKING (Patient taking differently: Take 50 mg by mouth in the morning and at bedtime.) 45 tablet 5   traZODone (DESYREL) 50 MG tablet TAKE 1/2- 1 TABLET BY MOUTH EVERY NIGHT AT BEDTIME AS NEEDED SLEEP (Patient taking differently: Take 50 mg by mouth at bedtime.) 30 tablet 3   TYLENOL 8 HOUR ARTHRITIS PAIN 650 MG CR tablet Take 1,300 mg by mouth in the morning and at bedtime.     vitamin B-12 (CYANOCOBALAMIN) 1000 MCG tablet Take 1,000 mcg by mouth daily.     No current facility-administered medications for this visit.    SURGICAL HISTORY:  Past Surgical History:  Procedure  Laterality Date   ABDOMINAL HYSTERECTOMY  1978   fibroma   APPENDECTOMY  2002   BREAST EXCISIONAL BIOPSY Right 2003   negative   BREAST LUMPECTOMY WITH RADIOACTIVE SEED LOCALIZATION Left 03/23/2020   Procedure: LEFT BREAST LUMPECTOMY WITH RADIOACTIVE SEED LOCALIZATION;  Surgeon: [name], MD;  Location: [facility];  Service: General;  Laterality: Left;  RNFA   CARPAL TUNNEL RELEASE Left 2010 or 2011   CATARACT EXTRACTION W/ INTRAOCULAR LENS  IMPLANT, BILATERAL Bilateral    COLONOSCOPY W/ POLYPECTOMY  03/27/2007; n7/19/2012   2008: 4 mm adenoma, diverticulosis 2012: ileitis ? NSAID - likely, 2-3 mm cecal polyp LYMPHOID FOLLICLE, diverticulosis   ESOPHAGOGASTRODUODENOSCOPY  01/26/2011   reflux esophagitis, colonscopy done also   HAMMER TOE SURGERY  2008  left foot   HERNIA REPAIR Right 1996?   IR ANGIO INTRA EXTRACRAN SEL COM CAROTID INNOMINATE BILAT MOD SED  10/25/2021   IR ANGIO INTRA EXTRACRAN SEL COM CAROTID INNOMINATE UNI R MOD SED  11/28/2018   IR ANGIO INTRA EXTRACRAN SEL COM CAROTID INNOMINATE UNI R MOD SED  03/17/2021   IR ANGIO INTRA EXTRACRAN SEL INTERNAL CAROTID UNI R MOD SED  12/30/2018   IR ANGIO VERTEBRAL SEL SUBCLAVIAN INNOMINATE UNI R MOD SED  11/28/2018   IR ANGIO VERTEBRAL SEL SUBCLAVIAN INNOMINATE UNI R MOD SED  10/26/2021   IR ANGIO VERTEBRAL SEL VERTEBRAL UNI R MOD SED  03/18/2021   IR ANGIOGRAM FOLLOW UP STUDY  12/30/2018   IR TRANSCATH/EMBOLIZ  12/30/2018   IR US GUIDE VASC ACCESS RIGHT  11/28/2018   IR US GUIDE VASC ACCESS RIGHT  03/17/2021   ORIF ANKLE FRACTURE Right 12/22/2014   Procedure: OPEN REDUCTION INTERNAL FIXATION (ORIF) ANKLE FRACTURE;  Surgeon: Susa Day, MD;  Location: [facility];  Service: Orthopedics;  Laterality: Right;   RADIOLOGY WITH ANESTHESIA N/A 12/30/2018   Procedure: RADIOLOGY WITH ANESTHESIA;  Surgeon: Luanne Bras, MD;  Location: [facility];  Service: Radiology;  Laterality: N/A;   SHOULDER OPEN ROTATOR CUFF REPAIR Left 03/06/2013   Procedure: LEFT  ROTATOR CUFF REPAIR, SUBACROMIAL DECOMPRESSION, PATCH GRAFT, MANIPULATION UNDER ANESTHESIA;  Surgeon: Johnn Hai, MD;  Location: [facility];  Service: Orthopedics;  Laterality: Left;   TONSILLECTOMY      REVIEW OF SYSTEMS:  A comprehensive review of systems was negative except for: Constitutional: positive for fatigue Respiratory: positive for dyspnea on exertion   PHYSICAL EXAMINATION: General appearance: alert, cooperative, fatigued, and no distress Head: Normocephalic, without obvious abnormality, atraumatic Neck: no adenopathy, no JVD, supple, symmetrical, trachea midline, and thyroid not enlarged, symmetric, no tenderness/mass/nodules Lymph nodes: Cervical, supraclavicular, and axillary nodes normal. Resp: clear to auscultation bilaterally Back: symmetric, no curvature. ROM normal. No CVA tenderness. Cardio: regular rate and rhythm, S1, S2 normal, no murmur, click, rub or gallop GI: soft, non-tender; bowel sounds normal; no masses,  no organomegaly Extremities: extremities normal, atraumatic, no cyanosis or edema  ECOG PERFORMANCE STATUS: 1 - Symptomatic but completely ambulatory  Blood pressure (!) 102/52, pulse (!) 102, temperature 97.6 F (36.4 C), temperature source Oral, resp. rate 18, height _0  (1.651 m), weight 155 lb (70.3 kg), SpO2 (!) 85 %.  LABORATORY DATA: Lab Results  Component Value Date   WBC 6.9 05/24/2022   HGB 9.7 (L) 05/24/2022   HCT 30.1 (L) 05/24/2022   MCV 110.7 (H) 05/24/2022   PLT 237 05/24/2022      Chemistry      Component Value Date/Time   NA 142 05/03/2022 1029   K 3.3 (L) 05/03/2022 1029   CL 106 05/03/2022 1029   CO2 30 05/03/2022 1029   BUN 12 05/03/2022 1029   CREATININE 0.74 05/03/2022 1029   CREATININE 0.76 05/24/2020 1414      Component Value Date/Time   CALCIUM 8.8 (L) 05/03/2022 1029   ALKPHOS 46 05/03/2022 1029   AST 15 05/03/2022 1029   ALT 9 05/03/2022 1029   BILITOT 0.3 05/03/2022 1029       RADIOGRAPHIC  STUDIES: No results found.  ASSESSMENT AND PLAN: This is a very pleasant 87 years old white female diagnosed with stage IV (T1c, N3, M1C) non-small cell lung cancer, adenocarcinoma presented with left upper lobe lung nodule in addition to bilateral mediastinal lymphadenopathy as well as bilateral pulmonary nodules and  multiple metastatic bone disease diagnosed in May 2023.  The patient also has a history of stage Ia breast cancer in August 2021 status post lumpectomy followed by radioactive seed localization and adjuvant radiotherapy and currently on tamoxifen. Molecular studies showed no actionable mutations except for positive KRAS G12C mutation and PD-L1 expression of 70%. She underwent palliative radiotherapy to several areas of metastatic bone disease including the hip and shoulder blade under the care of Dr. Lisbeth Renshaw The patient is currently undergoing systemic chemotherapy with carboplatin for AUC of 5, Alimta 500 Mg/M2 and Keytruda 200 Mg IV every 3 weeks status post 7 cycles.  Starting from cycle #5 she will be on maintenance treatment with Alimta and Keytruda every 3 weeks.  The patient has been tolerating this treatment well with no concerning adverse effect except for the fatigue. I recommended for her to proceed with cycle #8 today as planned. For the history of COPD, she is followed by Dr. Lamonte Sakai and she is expected to see him early next months. The patient will come back for follow-up visit in 3 weeks for evaluation before the next cycle of her treatment. The patient voices understanding of current disease status and treatment options and is in agreement with the current care plan.  All questions were answered. The patient knows to call the clinic with any problems, questions or concerns. We can certainly see the patient much sooner if necessary. The total time spent in the appointment was 20 minutes.  Disclaimer: This note was dictated with voice recognition software. Similar sounding  words can inadvertently be transcribed and may not be corrected upon review.

## 2022-05-24 NOTE — Telephone Encounter (Signed)
Called patient regarding December appointments, patient is notified.

## 2022-05-24 NOTE — Patient Instructions (Signed)
[facility]  Discharge Instructions: Thank you for choosing [facility] to provide your oncology and hematology care.   If you have a lab appointment with the [facility], please go directly to the [facility] and check in at the registration area.   Wear comfortable clothing and clothing appropriate for easy access to any Portacath or PICC line.   We strive to give you quality time with your provider. You may need to reschedule your appointment if you arrive late (15 or more minutes).  Arriving late affects you and other patients whose appointments are after yours.  Also, if you miss three or more appointments without notifying the office, you may be dismissed from the clinic at the provider's discretion.      For prescription refill requests, have your pharmacy contact our office and allow 72 hours for refills to be completed.    Today you received the following chemotherapy and/or immunotherapy agents: Alimta and Keytruda      To help prevent nausea and vomiting after your treatment, we encourage you to take your nausea medication as directed.  BELOW ARE SYMPTOMS THAT SHOULD BE REPORTED IMMEDIATELY: *FEVER GREATER THAN 100.4 F (38 C) OR HIGHER *CHILLS OR SWEATING *NAUSEA AND VOMITING THAT IS NOT CONTROLLED WITH YOUR NAUSEA MEDICATION *UNUSUAL SHORTNESS OF BREATH *UNUSUAL BRUISING OR BLEEDING *URINARY PROBLEMS (pain or burning when urinating, or frequent urination) *BOWEL PROBLEMS (unusual diarrhea, constipation, pain near the anus) TENDERNESS IN MOUTH AND THROAT WITH OR WITHOUT PRESENCE OF ULCERS (sore throat, sores in mouth, or a toothache) UNUSUAL RASH, SWELLING OR PAIN  UNUSUAL VAGINAL DISCHARGE OR ITCHING   Items with * indicate a potential emergency and should be followed up as soon as possible or go to the Emergency Department if any problems should occur.  Please show the CHEMOTHERAPY ALERT CARD or IMMUNOTHERAPY ALERT CARD at  check-in to the Emergency Department and triage nurse.  Should you have questions after your visit or need to cancel or reschedule your appointment, please contact [facility]  Dept: [phone]  and follow the prompts.  Office hours are 8:00 a.m. to 4:30 p.m. Monday - Friday. Please note that voicemails left after 4:00 p.m. may not be returned until the following business day.  We are closed weekends and major holidays. You have access to a nurse at all times for urgent questions. Please call the main number to the clinic Dept: [phone] and follow the prompts.   For any non-urgent questions, you may also contact your provider using MyChart. We now offer e-Visits for anyone 62 and older to request care online for non-urgent symptoms. For details visit [email or web address].   Also download the MyChart app! Go to the app store, search "MyChart", open the app, select [facility], and log in with your MyChart username and password.  Masks are optional in the cancer centers. If you would like for your care team to wear a mask while they are taking care of you, please let them know. You may have one support person who is at least 85 years old accompany you for your appointments.

## 2022-05-25 ENCOUNTER — Encounter: Payer: Self-pay | Admitting: Family Medicine

## 2022-05-25 ENCOUNTER — Ambulatory Visit (INDEPENDENT_AMBULATORY_CARE_PROVIDER_SITE_OTHER): Payer: Medicare HMO | Admitting: Family Medicine

## 2022-05-25 VITALS — BP 110/62 | HR 76 | Temp 98.4°F | Ht 65.0 in | Wt 155.0 lb

## 2022-05-25 DIAGNOSIS — E782 Mixed hyperlipidemia: Secondary | ICD-10-CM

## 2022-05-25 DIAGNOSIS — I10 Essential (primary) hypertension: Secondary | ICD-10-CM | POA: Diagnosis not present

## 2022-05-25 DIAGNOSIS — R053 Chronic cough: Secondary | ICD-10-CM | POA: Diagnosis not present

## 2022-05-25 DIAGNOSIS — J410 Simple chronic bronchitis: Secondary | ICD-10-CM

## 2022-05-25 MED ORDER — ROSUVASTATIN CALCIUM 10 MG PO TABS: 10.0000 mg | ORAL_TABLET | Freq: Every day | ORAL | 3 refills | Status: AC

## 2022-05-25 NOTE — Patient Instructions (Addendum)
cholesterol perhaps over controlled- reduce rosuvastatin from 20mg  to 10 mg - can cut in half what she has then pick up new prescription  Glad you are making some improvement and that things are stable- hope for reassuring next scan for you  Recommended follow up: Return in about 6 months (around 11/23/2022) for followup or sooner if needed.Schedule b4 you leave.

## 2022-05-25 NOTE — Progress Notes (Signed)
Phone [phone] In person visit   Subjective:   Sheila Shields is a 87 y.o. year old very pleasant female patient who presents for/with See problem oriented charting Chief Complaint  Patient presents with   Follow-up   Hypertension   Hyperlipidemia   Cough    Pt c/o dry cough   Past Medical History-  Patient Active Problem List   Diagnosis Date Noted   Adenocarcinoma of right lung, stage 4 ([place]) 12/21/2021    Priority: High   Pulmonary nodules/lesions, multiple 11/15/2021    Priority: High   Chronic respiratory failure ([place]) 10/28/2021    Priority: High   Mild aortic stenosis 05/24/2020    Priority: High   Malignant neoplasm of upper-outer quadrant of left breast in female, estrogen receptor positive ([place]) 02/12/2020    Priority: High   Brain aneurysm 12/30/2018    Priority: High   Chronic cough 10/11/2015    Priority: High   COPD (chronic obstructive pulmonary disease) ([place]) 11/18/2014    Priority: High   Insomnia 05/24/2020    Priority: Medium    Hyperglycemia 08/27/2007    Priority: Medium    Gastroesophageal reflux disease 05/01/2007    Priority: Medium    Hyperlipemia 12/17/2006    Priority: Medium    Essential hypertension 12/17/2006    Priority: Medium    Encounter for antineoplastic chemotherapy 01/18/2022    Priority: Low   Encounter for antineoplastic immunotherapy 12/21/2021    Priority: Low   DOE (dyspnea on exertion) 10/28/2021    Priority: Low   Former smoker 05/22/2019    Priority: Low   Aortic atherosclerosis ([place]) 04/11/2018    Priority: Low   Allergic rhinitis 11/09/2017    Priority: Low   Arm mass, left 04/16/2017    Priority: Low   Iron deficiency anemia, unspecified 01/20/2011    Priority: Low   Arthropathy 02/23/2010    Priority: Low   Alcoholism in remission ([place]) 08/27/2007    Priority: Low   Pneumonia 04/16/2022   Acute on chronic respiratory failure with hypoxia ([place]) 04/15/2022   Acquired hammer toe of right foot  04/15/2022   Acquired hammer toe of left foot 04/15/2022   Peripheral vascular disease ([place]) 04/15/2022   Microcytic anemia 04/15/2022   Left lower lobe pulmonary infiltrate 04/15/2022   COPD with acute exacerbation ([place]) 04/15/2022   Tenosynovitis of fingers 08/09/2020   Osteoarthritis of ankle and foot 11/13/2017    Medications- reviewed and updated Current Outpatient Medications  Medication Sig Dispense Refill   albuterol (PROAIR HFA) 108 (90 Base) MCG/ACT inhaler INHALE 2 PUFFS EVERY 4 HOURS AS NEEDED FOR COUGHING SPELLS (Patient taking differently: Inhale 2 puffs into the lungs every 4 (four) hours as needed ("for coughing spells").) 18 g 5   aspirin 325 MG tablet Take 1 tablet (325 mg total) by mouth daily. 30 tablet 3   azelastine (ASTELIN) 0.1 % nasal spray Place 2 sprays into both nostrils 2 (two) times daily.     benzonatate (TESSALON) 200 MG capsule Take 1 capsule (200 mg total) by mouth 3 (three) times daily as needed for cough. (Patient taking differently: Take 200 mg by mouth See admin instructions. Take 200 mg by mouth in the morning and an additional 200 mg two times a day as needed for coughing) 90 capsule 2   Budeson-Glycopyrrol-Formoterol (BREZTRI AEROSPHERE) 160-9-4.8 MCG/ACT AERO Inhale 2 puffs into the lungs in the morning and at bedtime. 10.7 g 6   budesonide (PULMICORT FLEXHALER) 180 MCG/ACT inhaler Inhale  1 puff into the lungs 2 (two) times daily. 1 each 5   Cholecalciferol (VITAMIN D3) 50 MCG (2000 UT) TABS Take 2,000 Units by mouth in the morning.     clopidogrel (PLAVIX) 75 MG tablet Take 0.5 tablets (37.5 mg total) by mouth daily. 46 tablet 3   Coenzyme Q10 300 MG CAPS Take 300 mg by mouth daily.     fexofenadine (ALLEGRA) 180 MG tablet Take 180 mg by mouth daily.     folic acid (FOLVITE) 1 MG tablet TAKE 1 TABLET(1 MG) BY MOUTH DAILY (Patient taking differently: Take 1 mg by mouth daily.) 30 tablet 2   guaiFENesin (MUCINEX) 600 MG 12 hr tablet Take 1 tablet by  mouth daily.     lactose free nutrition (BOOST) LIQD Take 237 mLs by mouth daily.     montelukast (SINGULAIR) 10 MG tablet TAKE 1 TABLET(10 MG) BY MOUTH AT BEDTIME 30 tablet 5   Multiple Vitamin (MULTIVITAMIN WITH MINERALS) TABS tablet Take 1 tablet by mouth daily with breakfast.     pantoprazole (PROTONIX) 40 MG tablet TAKE 1 TABLET(40 MG) BY MOUTH DAILY 30 tablet 5   potassium chloride SA (KLOR-CON M) 20 MEQ tablet Take 20 mEq by mouth every other day.     promethazine-dextromethorphan (PROMETHAZINE-DM) 6.25-15 MG/5ML syrup Take 5 mLs by mouth 4 (four) times daily as needed for cough. 118 mL 0   tamoxifen (NOLVADEX) 20 MG tablet Take 1 tablet (20 mg total) by mouth daily. 90 tablet 3   traMADol (ULTRAM) 50 MG tablet TAKE 1 TABLET BY MOUTH EVERY 8 HOURS FOR UP TO 5 DAYS AS NEEDED FOR PAIN. DO NOT DRIVE FOR 8 HOURS AFTER TAKING (Patient taking differently: Take 50 mg by mouth in the morning and at bedtime.) 45 tablet 5   traZODone (DESYREL) 50 MG tablet TAKE 1/2- 1 TABLET BY MOUTH EVERY NIGHT AT BEDTIME AS NEEDED SLEEP (Patient taking differently: Take 50 mg by mouth at bedtime.) 30 tablet 3   TYLENOL 8 HOUR ARTHRITIS PAIN 650 MG CR tablet Take 1,300 mg by mouth in the morning and at bedtime.     vitamin B-12 (CYANOCOBALAMIN) 1000 MCG tablet Take 1,000 mcg by mouth daily.     rosuvastatin (CRESTOR) 10 MG tablet Take 1 tablet (10 mg total) by mouth daily. 90 tablet 3   No current facility-administered medications for this visit.     Objective:  BP 110/62   Pulse 76   Temp 98.4 F (36.9 C)   Ht 5\' 5"  (1.651 m)   Wt 155 lb (70.3 kg)   SpO2 92%   BMI 25.79 kg/m  Gen: NAD, resting comfortably, wearing oxygen CV: RRR- some ectopic beats (PVCs in past)  Lungs: CTAB no crackles, wheeze, rhonchi  Ext: no edema Skin: warm, dry    Assessment and Plan   #Stage IV non-small cell lung cancer diagnosed May 2023-has received systemic injection therapy- tolerating these well- plans to continue as  long as no significant worsening.  Follows with Dr. Julien Nordmann.  -Has led to chronic respiratory failure- remains on oxygen  #hypertension S: medication: none other than prn lasix -hasn't needed -prior Hydrochlorothiazide 12.5 mg-and would take potassium with this every other day. Reduced from 25 mg on 11/23/20 BP Readings from Last 3 Encounters:  05/25/22 110/62  05/24/22 (!) 135/50  05/24/22 (!) 102/52   A/P: excellent control without meds- continue to monitor   #hyperlipidemia S: Medication: Rosuvastatin 20 mg daily-previously on simvastatin and had myalgias and arthralgias.  Also did not tolerate atorvastatin Lab Results  Component Value Date   CHOL 92 04/27/2022   HDL 41.80 04/27/2022   LDLCALC 27 04/27/2022   LDLDIRECT 39.0 05/26/2021   TRIG 115.0 04/27/2022   CHOLHDL 2 04/27/2022   A/P: cholesterol perhaps over controlled- reduce rosuvastatin from 20mg  to 10 mg - can cut in half what she has then pick up new prescription   #COPD-was told Dr. Lamonte Sakai in the past-most recently seen by Delice Lesch, NP with pulmonology on 04/26/2022 S: Medication: Albuterol as needed prior to cancer diagnosis-also now on Bevespi -Patient is being maintained on 4 L of oxygen with goal 88 to 90% oxygen at least -Prior left lower lobe infiltrates-stable cough since treatment but has completed IV antibiotics with Rocephin and azithromycin and discharged on doxycycline previously-plan was repeat imaging if fail to improve with reassessment in 5 to 6 weeks planned -After last visit recommended short-term SNF to work on strength- unfortunately did not have a good experience- food was bad, was a weekend and did not really get much PT/OT which was our purpose -still tired overall from treatments- sleeps a lot in daytime and fatigue as well -appetite low- but trying to do protein drinks  a few times a day- will try to do daily -lingering cough- ? Copd vs lung cancer- but not worsening. Has needed albuterol more.   A/P: COPD ongoing issues plus complicated by prior pneumonia and lung cancer- considered prednisone but overall stable and sees Dr. Lamonte Sakai next month- we opted to continue current meds and see what he says   #Mild aortic stenosis discovered 06/29/2020- we will plan to repeat every 2 to 3 years- considered today- touch base early next year and we can decide- see how she is feeling- focus on enjoying the holidays    #alcoholism in remission-40.5 years sober in 2023!   # Hyperglycemia/insulin resistance/prediabetes-peak A1c 6.4 in 2018-weight loss since that time- consider a1c if do bloodwork in future but trended down with weight loss and lower appetite  Recommended follow up: No follow-ups on file. Future Appointments  Date Time Provider [place]  06/14/2022 10:15 AM [facility] LAB [facility] None  06/14/2022 10:45 AM Curt Bears, MD [facility] None  06/14/2022 11:30 AM [facility] INFUSION [facility] None  06/15/2022 10:15 AM Byrum, Rose Fillers, MD [facility] None  07/05/2022 10:30 AM [facility] LAB [facility] None  07/05/2022 11:00 AM Heilingoetter, Cassandra L, PA-C [facility] None  07/05/2022 11:45 AM [facility] INFUSION [facility] None  08/25/2022  9:30 AM [facility] HEALTH COACH [facility] PEC  10/04/2022 11:20 AM Truitt Merle, MD [facility] None   Lab/Order associations:   ICD-10-CM   1. Simple chronic bronchitis (HCC)  J41.0     2. Chronic cough  R05.3     3. Essential hypertension  I10     4. Mixed hyperlipidemia  E78.2       Meds ordered this encounter  Medications   rosuvastatin (CRESTOR) 10 MG tablet    Sig: Take 1 tablet (10 mg total) by mouth daily.    Dispense:  90 tablet    Refill:  3   Return precautions advised.  Garret Reddish, MD

## 2022-05-26 DIAGNOSIS — R278 Other lack of coordination: Secondary | ICD-10-CM | POA: Diagnosis not present

## 2022-05-26 DIAGNOSIS — R2689 Other abnormalities of gait and mobility: Secondary | ICD-10-CM | POA: Diagnosis not present

## 2022-05-26 DIAGNOSIS — M6281 Muscle weakness (generalized): Secondary | ICD-10-CM | POA: Diagnosis not present

## 2022-05-26 DIAGNOSIS — C3490 Malignant neoplasm of unspecified part of unspecified bronchus or lung: Secondary | ICD-10-CM | POA: Diagnosis not present

## 2022-05-26 DIAGNOSIS — J189 Pneumonia, unspecified organism: Secondary | ICD-10-CM | POA: Diagnosis not present

## 2022-05-27 ENCOUNTER — Other Ambulatory Visit: Payer: Self-pay

## 2022-05-29 ENCOUNTER — Inpatient Hospital Stay (HOSPITAL_COMMUNITY)
Admission: EM | Admit: 2022-05-29 | Discharge: 2022-06-07 | DRG: 190 | Disposition: A | Discharge status: Hospice | Payer: Medicare HMO | Source: Skilled Nursing Facility | Attending: Internal Medicine | Admitting: Internal Medicine

## 2022-05-29 ENCOUNTER — Other Ambulatory Visit: Payer: Self-pay

## 2022-05-29 ENCOUNTER — Emergency Department (HOSPITAL_COMMUNITY): Payer: Medicare HMO

## 2022-05-29 ENCOUNTER — Inpatient Hospital Stay: Payer: Medicare HMO

## 2022-05-29 ENCOUNTER — Encounter (HOSPITAL_COMMUNITY): Payer: Self-pay

## 2022-05-29 DIAGNOSIS — Z66 Do not resuscitate: Secondary | ICD-10-CM | POA: Diagnosis not present

## 2022-05-29 DIAGNOSIS — I10 Essential (primary) hypertension: Secondary | ICD-10-CM | POA: Diagnosis present

## 2022-05-29 DIAGNOSIS — J441 Chronic obstructive pulmonary disease with (acute) exacerbation: Secondary | ICD-10-CM

## 2022-05-29 DIAGNOSIS — Z79899 Other long term (current) drug therapy: Secondary | ICD-10-CM | POA: Diagnosis not present

## 2022-05-29 DIAGNOSIS — Z8673 Personal history of transient ischemic attack (TIA), and cerebral infarction without residual deficits: Secondary | ICD-10-CM | POA: Diagnosis not present

## 2022-05-29 DIAGNOSIS — R058 Other specified cough: Secondary | ICD-10-CM | POA: Diagnosis not present

## 2022-05-29 DIAGNOSIS — J438 Other emphysema: Secondary | ICD-10-CM | POA: Diagnosis not present

## 2022-05-29 DIAGNOSIS — R0689 Other abnormalities of breathing: Secondary | ICD-10-CM | POA: Diagnosis not present

## 2022-05-29 DIAGNOSIS — E440 Moderate protein-calorie malnutrition: Secondary | ICD-10-CM | POA: Diagnosis not present

## 2022-05-29 DIAGNOSIS — R059 Cough, unspecified: Secondary | ICD-10-CM | POA: Diagnosis not present

## 2022-05-29 DIAGNOSIS — E8770 Fluid overload, unspecified: Secondary | ICD-10-CM | POA: Diagnosis present

## 2022-05-29 DIAGNOSIS — R54 Age-related physical debility: Secondary | ICD-10-CM | POA: Diagnosis present

## 2022-05-29 DIAGNOSIS — Z8249 Family history of ischemic heart disease and other diseases of the circulatory system: Secondary | ICD-10-CM

## 2022-05-29 DIAGNOSIS — T68XXXA Hypothermia, initial encounter: Secondary | ICD-10-CM | POA: Diagnosis not present

## 2022-05-29 DIAGNOSIS — I959 Hypotension, unspecified: Secondary | ICD-10-CM | POA: Diagnosis not present

## 2022-05-29 DIAGNOSIS — F1021 Alcohol dependence, in remission: Secondary | ICD-10-CM | POA: Diagnosis present

## 2022-05-29 DIAGNOSIS — C7951 Secondary malignant neoplasm of bone: Secondary | ICD-10-CM | POA: Diagnosis not present

## 2022-05-29 DIAGNOSIS — C50412 Malignant neoplasm of upper-outer quadrant of left female breast: Secondary | ICD-10-CM | POA: Diagnosis not present

## 2022-05-29 DIAGNOSIS — Z7951 Long term (current) use of inhaled steroids: Secondary | ICD-10-CM

## 2022-05-29 DIAGNOSIS — Z87891 Personal history of nicotine dependence: Secondary | ICD-10-CM | POA: Diagnosis not present

## 2022-05-29 DIAGNOSIS — J9621 Acute and chronic respiratory failure with hypoxia: Secondary | ICD-10-CM

## 2022-05-29 DIAGNOSIS — J811 Chronic pulmonary edema: Secondary | ICD-10-CM | POA: Diagnosis not present

## 2022-05-29 DIAGNOSIS — Z170 Estrogen receptor positive status [ER+]: Secondary | ICD-10-CM

## 2022-05-29 DIAGNOSIS — Z7189 Other specified counseling: Secondary | ICD-10-CM

## 2022-05-29 DIAGNOSIS — R0600 Dyspnea, unspecified: Secondary | ICD-10-CM | POA: Diagnosis not present

## 2022-05-29 DIAGNOSIS — Z9981 Dependence on supplemental oxygen: Secondary | ICD-10-CM | POA: Diagnosis not present

## 2022-05-29 DIAGNOSIS — Z9842 Cataract extraction status, left eye: Secondary | ICD-10-CM

## 2022-05-29 DIAGNOSIS — Z888 Allergy status to other drugs, medicaments and biological substances status: Secondary | ICD-10-CM | POA: Diagnosis not present

## 2022-05-29 DIAGNOSIS — C50912 Malignant neoplasm of unspecified site of left female breast: Secondary | ICD-10-CM | POA: Diagnosis not present

## 2022-05-29 DIAGNOSIS — Z90710 Acquired absence of both cervix and uterus: Secondary | ICD-10-CM

## 2022-05-29 DIAGNOSIS — R062 Wheezing: Secondary | ICD-10-CM | POA: Diagnosis not present

## 2022-05-29 DIAGNOSIS — Z7902 Long term (current) use of antithrombotics/antiplatelets: Secondary | ICD-10-CM

## 2022-05-29 DIAGNOSIS — D539 Nutritional anemia, unspecified: Secondary | ICD-10-CM | POA: Diagnosis present

## 2022-05-29 DIAGNOSIS — J984 Other disorders of lung: Secondary | ICD-10-CM | POA: Diagnosis present

## 2022-05-29 DIAGNOSIS — Z6826 Body mass index (BMI) 26.0-26.9, adult: Secondary | ICD-10-CM

## 2022-05-29 DIAGNOSIS — K2100 Gastro-esophageal reflux disease with esophagitis, without bleeding: Secondary | ICD-10-CM | POA: Diagnosis not present

## 2022-05-29 DIAGNOSIS — K219 Gastro-esophageal reflux disease without esophagitis: Secondary | ICD-10-CM | POA: Diagnosis present

## 2022-05-29 DIAGNOSIS — Z808 Family history of malignant neoplasm of other organs or systems: Secondary | ICD-10-CM | POA: Diagnosis not present

## 2022-05-29 DIAGNOSIS — J90 Pleural effusion, not elsewhere classified: Secondary | ICD-10-CM | POA: Diagnosis present

## 2022-05-29 DIAGNOSIS — E782 Mixed hyperlipidemia: Secondary | ICD-10-CM | POA: Diagnosis not present

## 2022-05-29 DIAGNOSIS — J9611 Chronic respiratory failure with hypoxia: Secondary | ICD-10-CM | POA: Diagnosis not present

## 2022-05-29 DIAGNOSIS — E785 Hyperlipidemia, unspecified: Secondary | ICD-10-CM | POA: Diagnosis not present

## 2022-05-29 DIAGNOSIS — R0602 Shortness of breath: Secondary | ICD-10-CM | POA: Diagnosis not present

## 2022-05-29 DIAGNOSIS — Z1152 Encounter for screening for COVID-19: Secondary | ICD-10-CM | POA: Diagnosis not present

## 2022-05-29 DIAGNOSIS — Z9841 Cataract extraction status, right eye: Secondary | ICD-10-CM

## 2022-05-29 DIAGNOSIS — R0902 Hypoxemia: Secondary | ICD-10-CM | POA: Diagnosis not present

## 2022-05-29 DIAGNOSIS — I272 Pulmonary hypertension, unspecified: Secondary | ICD-10-CM | POA: Diagnosis not present

## 2022-05-29 DIAGNOSIS — C3491 Malignant neoplasm of unspecified part of right bronchus or lung: Secondary | ICD-10-CM

## 2022-05-29 DIAGNOSIS — Z7982 Long term (current) use of aspirin: Secondary | ICD-10-CM

## 2022-05-29 DIAGNOSIS — Z961 Presence of intraocular lens: Secondary | ICD-10-CM | POA: Diagnosis present

## 2022-05-29 DIAGNOSIS — Z515 Encounter for palliative care: Secondary | ICD-10-CM | POA: Diagnosis not present

## 2022-05-29 DIAGNOSIS — F419 Anxiety disorder, unspecified: Secondary | ICD-10-CM | POA: Diagnosis present

## 2022-05-29 DIAGNOSIS — J432 Centrilobular emphysema: Principal | ICD-10-CM | POA: Diagnosis present

## 2022-05-29 DIAGNOSIS — R531 Weakness: Secondary | ICD-10-CM | POA: Diagnosis not present

## 2022-05-29 DIAGNOSIS — Z79810 Long term (current) use of selective estrogen receptor modulators (SERMs): Secondary | ICD-10-CM

## 2022-05-29 DIAGNOSIS — Z743 Need for continuous supervision: Secondary | ICD-10-CM | POA: Diagnosis not present

## 2022-05-29 LAB — CBC
HCT: 27.3 % — ABNORMAL LOW (ref 36.0–46.0)
Hemoglobin: 8.7 g/dL — ABNORMAL LOW (ref 12.0–15.0)
MCH: 35.5 pg — ABNORMAL HIGH (ref 26.0–34.0)
MCHC: 31.9 g/dL (ref 30.0–36.0)
MCV: 111.4 fL — ABNORMAL HIGH (ref 80.0–100.0)
Platelets: 206 10*3/uL (ref 150–400)
RBC: 2.45 MIL/uL — ABNORMAL LOW (ref 3.87–5.11)
RDW: 15.2 % (ref 11.5–15.5)
WBC: 7.8 10*3/uL (ref 4.0–10.5)
nRBC: 0 % (ref 0.0–0.2)

## 2022-05-29 LAB — BASIC METABOLIC PANEL
Anion gap: 10 (ref 5–15)
BUN: 16 mg/dL (ref 8–23)
CO2: 27 mmol/L (ref 22–32)
Calcium: 8.6 mg/dL — ABNORMAL LOW (ref 8.9–10.3)
Chloride: 102 mmol/L (ref 98–111)
Creatinine, Ser: 0.71 mg/dL (ref 0.44–1.00)
GFR, Estimated: >60 mL/min (ref >60)
Glucose, Bld: 115 mg/dL — ABNORMAL HIGH (ref 70–99)
Potassium: 3.7 mmol/L (ref 3.5–5.1)
Sodium: 139 mmol/L (ref 135–145)

## 2022-05-29 LAB — BLOOD GAS, VENOUS
Acid-Base Excess: 5.8 mmol/L — ABNORMAL HIGH (ref 0.0–2.0)
Bicarbonate: 30.6 mmol/L — ABNORMAL HIGH (ref 20.0–28.0)
O2 Saturation: 85.1 %
Patient temperature: 37
pCO2, Ven: 44 mmHg (ref 44–60)
pH, Ven: 7.45 — ABNORMAL HIGH (ref 7.25–7.43)
pO2, Ven: 53 mmHg — ABNORMAL HIGH (ref 32–45)

## 2022-05-29 LAB — RESP PANEL BY RT-PCR (FLU A&B, COVID) ARPGX2
Influenza A by PCR: NEGATIVE
Influenza B by PCR: NEGATIVE
SARS Coronavirus 2 by RT PCR: NEGATIVE

## 2022-05-29 LAB — BRAIN NATRIURETIC PEPTIDE: B Natriuretic Peptide: 500.5 pg/mL — ABNORMAL HIGH (ref 0.0–100.0)

## 2022-05-29 MED ORDER — BOOST PO LIQD: 237.0000 mL | Freq: Every day | ORAL | Status: DC

## 2022-05-29 MED ORDER — MONTELUKAST SODIUM 10 MG PO TABS
10.0000 mg | ORAL_TABLET | Freq: Every day | ORAL | Status: DC
  Administered 2022-05-29 – 2022-06-06 (×9): 10 mg via ORAL
  Filled 2022-05-29 (×9): qty 1

## 2022-05-29 MED ORDER — SODIUM CHLORIDE 0.9 % IV SOLN
500.0000 mg | INTRAVENOUS | Status: AC
  Administered 2022-05-29 – 2022-06-03 (×6): 500 mg via INTRAVENOUS
  Filled 2022-05-29 (×6): qty 5

## 2022-05-29 MED ORDER — METHYLPREDNISOLONE SODIUM SUCC 40 MG IJ SOLR
40.0000 mg | Freq: Two times a day (BID) | INTRAMUSCULAR | Status: DC
  Administered 2022-05-29 – 2022-06-02 (×9): 40 mg via INTRAVENOUS
  Filled 2022-05-29 (×9): qty 1

## 2022-05-29 MED ORDER — PROMETHAZINE-DM 6.25-15 MG/5ML PO SYRP: 5.0000 mL | ORAL_SOLUTION | Freq: Four times a day (QID) | ORAL | Status: DC | PRN

## 2022-05-29 MED ORDER — BENZONATATE 100 MG PO CAPS
200.0000 mg | ORAL_CAPSULE | Freq: Three times a day (TID) | ORAL | Status: DC | PRN
  Administered 2022-05-30 – 2022-06-07 (×3): 200 mg via ORAL
  Filled 2022-05-29 (×3): qty 2

## 2022-05-29 MED ORDER — SODIUM CHLORIDE 0.9 % IV BOLUS
500.0000 mL | Freq: Once | INTRAVENOUS | Status: AC
  Administered 2022-05-29: 500 mL via INTRAVENOUS

## 2022-05-29 MED ORDER — SODIUM CHLORIDE 0.9 % IV SOLN
1.0000 g | INTRAVENOUS | Status: DC
  Administered 2022-05-29 – 2022-05-30 (×2): 1 g via INTRAVENOUS
  Filled 2022-05-29 (×2): qty 10

## 2022-05-29 MED ORDER — MORPHINE SULFATE (PF) 2 MG/ML IV SOLN: 2.0000 mg | INTRAVENOUS | Status: DC | PRN

## 2022-05-29 MED ORDER — IOHEXOL 350 MG/ML SOLN
75.0000 mL | Freq: Once | INTRAVENOUS | Status: AC | PRN
  Administered 2022-05-29: 75 mL via INTRAVENOUS

## 2022-05-29 MED ORDER — VITAMIN D 25 MCG (1000 UNIT) PO TABS
2000.0000 [IU] | ORAL_TABLET | Freq: Every day | ORAL | Status: DC
  Administered 2022-05-30 – 2022-06-07 (×9): 2000 [IU] via ORAL
  Filled 2022-05-29 (×9): qty 2

## 2022-05-29 MED ORDER — VITAMIN B-12 1000 MCG PO TABS
1000.0000 ug | ORAL_TABLET | Freq: Every day | ORAL | Status: DC
  Administered 2022-05-30 – 2022-06-07 (×9): 1000 ug via ORAL
  Filled 2022-05-29 (×10): qty 1

## 2022-05-29 MED ORDER — BUDESON-GLYCOPYRROL-FORMOTEROL 160-9-4.8 MCG/ACT IN AERO: 2.0000 | INHALATION_SPRAY | Freq: Four times a day (QID) | RESPIRATORY_TRACT | Status: DC | PRN

## 2022-05-29 MED ORDER — COENZYME Q10 300 MG PO CAPS: 300.0000 mg | ORAL_CAPSULE | Freq: Every day | ORAL | Status: DC

## 2022-05-29 MED ORDER — PANTOPRAZOLE SODIUM 40 MG PO TBEC
40.0000 mg | DELAYED_RELEASE_TABLET | Freq: Every day | ORAL | Status: DC
  Administered 2022-05-30 – 2022-06-07 (×9): 40 mg via ORAL
  Filled 2022-05-29 (×9): qty 1

## 2022-05-29 MED ORDER — ALBUTEROL SULFATE HFA 108 (90 BASE) MCG/ACT IN AERS: 2.0000 | INHALATION_SPRAY | RESPIRATORY_TRACT | Status: DC | PRN

## 2022-05-29 MED ORDER — ONDANSETRON HCL 4 MG PO TABS: 4.0000 mg | ORAL_TABLET | Freq: Four times a day (QID) | ORAL | Status: DC | PRN

## 2022-05-29 MED ORDER — TRAMADOL HCL 50 MG PO TABS
50.0000 mg | ORAL_TABLET | Freq: Every day | ORAL | Status: DC | PRN
  Administered 2022-06-01 (×2): 50 mg via ORAL
  Filled 2022-05-29 (×2): qty 1

## 2022-05-29 MED ORDER — TAMOXIFEN CITRATE 10 MG PO TABS
20.0000 mg | ORAL_TABLET | Freq: Every day | ORAL | Status: DC
  Administered 2022-05-30 – 2022-06-07 (×9): 20 mg via ORAL
  Filled 2022-05-29 (×9): qty 2

## 2022-05-29 MED ORDER — POTASSIUM CHLORIDE CRYS ER 20 MEQ PO TBCR
20.0000 meq | EXTENDED_RELEASE_TABLET | ORAL | Status: DC
  Administered 2022-05-30 – 2022-06-05 (×4): 20 meq via ORAL
  Filled 2022-05-29 (×5): qty 1

## 2022-05-29 MED ORDER — ROSUVASTATIN CALCIUM 10 MG PO TABS
10.0000 mg | ORAL_TABLET | Freq: Every day | ORAL | Status: DC
  Administered 2022-05-30 – 2022-06-07 (×9): 10 mg via ORAL
  Filled 2022-05-29 (×10): qty 1

## 2022-05-29 MED ORDER — CLOPIDOGREL BISULFATE 75 MG PO TABS
37.5000 mg | ORAL_TABLET | Freq: Every day | ORAL | Status: DC
  Administered 2022-05-30 – 2022-06-07 (×9): 37.5 mg via ORAL
  Filled 2022-05-29 (×10): qty 1

## 2022-05-29 MED ORDER — ENOXAPARIN SODIUM 40 MG/0.4ML IJ SOSY
40.0000 mg | PREFILLED_SYRINGE | INTRAMUSCULAR | Status: DC
  Administered 2022-05-29 – 2022-06-06 (×9): 40 mg via SUBCUTANEOUS
  Filled 2022-05-29 (×9): qty 0.4

## 2022-05-29 MED ORDER — LORATADINE 10 MG PO TABS
10.0000 mg | ORAL_TABLET | Freq: Every day | ORAL | Status: DC
  Administered 2022-05-30 – 2022-06-07 (×9): 10 mg via ORAL
  Filled 2022-05-29 (×9): qty 1

## 2022-05-29 MED ORDER — ARFORMOTEROL TARTRATE 15 MCG/2ML IN NEBU
15.0000 ug | INHALATION_SOLUTION | Freq: Two times a day (BID) | RESPIRATORY_TRACT | Status: DC
  Administered 2022-05-29 – 2022-06-07 (×18): 15 ug via RESPIRATORY_TRACT
  Filled 2022-05-29 (×18): qty 2

## 2022-05-29 MED ORDER — ALBUTEROL SULFATE (2.5 MG/3ML) 0.083% IN NEBU: 2.5000 mg | INHALATION_SOLUTION | RESPIRATORY_TRACT | Status: DC | PRN

## 2022-05-29 MED ORDER — ASPIRIN 325 MG PO TABS
325.0000 mg | ORAL_TABLET | Freq: Every day | ORAL | Status: DC
  Administered 2022-05-30 – 2022-06-07 (×9): 325 mg via ORAL
  Filled 2022-05-29 (×9): qty 1

## 2022-05-29 MED ORDER — ACETAMINOPHEN 325 MG PO TABS: 650.0000 mg | ORAL_TABLET | Freq: Four times a day (QID) | ORAL | Status: DC | PRN

## 2022-05-29 MED ORDER — GUAIFENESIN ER 600 MG PO TB12
600.0000 mg | ORAL_TABLET | Freq: Every day | ORAL | Status: DC
  Administered 2022-05-30 – 2022-05-31 (×2): 600 mg via ORAL
  Filled 2022-05-29 (×2): qty 1

## 2022-05-29 MED ORDER — METHYLPREDNISOLONE SODIUM SUCC 125 MG IJ SOLR
125.0000 mg | Freq: Once | INTRAMUSCULAR | Status: AC
  Administered 2022-05-29: 125 mg via INTRAVENOUS
  Filled 2022-05-29: qty 2

## 2022-05-29 MED ORDER — BUDESONIDE 0.5 MG/2ML IN SUSP
0.5000 mg | Freq: Two times a day (BID) | RESPIRATORY_TRACT | Status: DC
  Administered 2022-05-29 – 2022-06-07 (×18): 0.5 mg via RESPIRATORY_TRACT
  Filled 2022-05-29 (×18): qty 2

## 2022-05-29 MED ORDER — UMECLIDINIUM BROMIDE 62.5 MCG/ACT IN AEPB
1.0000 | INHALATION_SPRAY | Freq: Every day | RESPIRATORY_TRACT | Status: DC
  Administered 2022-05-30 – 2022-06-07 (×9): 1 via RESPIRATORY_TRACT
  Filled 2022-05-29 (×2): qty 7

## 2022-05-29 MED ORDER — ONDANSETRON HCL 4 MG/2ML IJ SOLN: 4.0000 mg | Freq: Four times a day (QID) | INTRAMUSCULAR | Status: DC | PRN

## 2022-05-29 MED ORDER — ACETAMINOPHEN 650 MG RE SUPP: 650.0000 mg | Freq: Four times a day (QID) | RECTAL | Status: DC | PRN

## 2022-05-29 MED ORDER — ACETAMINOPHEN 500 MG PO TABS
1000.0000 mg | ORAL_TABLET | Freq: Four times a day (QID) | ORAL | Status: DC
  Administered 2022-05-29 – 2022-06-02 (×12): 1000 mg via ORAL
  Filled 2022-05-29 (×16): qty 2

## 2022-05-29 MED ORDER — FOLIC ACID 1 MG PO TABS
1.0000 mg | ORAL_TABLET | Freq: Every day | ORAL | Status: DC
  Administered 2022-05-30 – 2022-06-07 (×9): 1 mg via ORAL
  Filled 2022-05-29 (×9): qty 1

## 2022-05-29 MED ORDER — TRAZODONE HCL 50 MG PO TABS
50.0000 mg | ORAL_TABLET | Freq: Every day | ORAL | Status: DC
  Administered 2022-05-29 – 2022-06-06 (×9): 50 mg via ORAL
  Filled 2022-05-29 (×9): qty 1

## 2022-05-29 MED ORDER — ADULT MULTIVITAMIN W/MINERALS CH
1.0000 | ORAL_TABLET | Freq: Every day | ORAL | Status: DC
  Administered 2022-05-30 – 2022-06-07 (×9): 1 via ORAL
  Filled 2022-05-29 (×10): qty 1

## 2022-05-29 MED ORDER — IPRATROPIUM-ALBUTEROL 0.5-2.5 (3) MG/3ML IN SOLN
3.0000 mL | Freq: Once | RESPIRATORY_TRACT | Status: AC
  Administered 2022-05-29: 3 mL via RESPIRATORY_TRACT
  Filled 2022-05-29: qty 3

## 2022-05-29 NOTE — ED Notes (Signed)
Pt is speaking in full sentences with good pleth, but sats reading 73%. Pt states that she frequently sats in the 70s

## 2022-05-29 NOTE — H&P (Signed)
History and Physical    Patient: Sheila Shields XHB:[id number] DOB: February 08, 1935 DOA: 05/29/2022 DOS: the patient was seen and examined on 05/29/2022 PCP: Marin Olp, MD  Patient coming from: Home  Chief Complaint:  Chief Complaint  Patient presents with   Shortness of Breath   HPI: Sheila Shields is a 87 y.o. female with medical history significant of COPD, GERD, left breast cancer, anxiety disorder, history of TIA, hyperlipidemia, essential hypertension, stage IV adenocarcinoma of the lungs and osteoarthritis who presented to the emergency room with complaint of shortness of breath and cough.  Symptoms started couple of days ago but has gotten worse.  Patient normally is on 4 L of oxygen at home with chronic respiratory failure.  Patient here is requiring up to 10 L of oxygen in the ER.  Has apparently taken all her home regimen but despite that she is getting increasing shortness of breath.  No chest pain.  Patient has been trying to hold off for the last 4 days as family are coming from out of town.  Work-up so far shows no evidence of PE.  Significant COPD with atypical infection versus edema.  She has minimally elevated BNP but does not appear to have significant fluid overload.  Patient's oxygen sats is in the 70s and 80s on her regular 4 L.  At this point she is being admitted for further evaluation and treatment.  Review of Systems: As mentioned in the history of present illness. All other systems reviewed and are negative. Past Medical History:  Diagnosis Date   Alcoholism in remission ([place]) 08/27/2007   Anemia    Arthritis    OA   Breast cancer ([place])    left breast   COPD 12/17/2006   Diverticulosis 03/27/2007   GERD 05/01/2007   HYPERGLYCEMIA 08/27/2007   HYPERLIPIDEMIA 12/17/2006   HYPERTENSION 12/17/2006   Mood disorder ([place])    hx anxiety and depression. meds short term during life transition   Pneumonia    AS A CHILD   Rectal polyp 03/27/2007   adenoma   SOB (shortness  of breath) on exertion    per patient due to having COPD   TIA (transient ischemic attack)    Past Surgical History:  Procedure Laterality Date   ABDOMINAL HYSTERECTOMY  1978   fibroma   APPENDECTOMY  2002   BREAST EXCISIONAL BIOPSY Right 2003   negative   BREAST LUMPECTOMY WITH RADIOACTIVE SEED LOCALIZATION Left 03/23/2020   Procedure: LEFT BREAST LUMPECTOMY WITH RADIOACTIVE SEED LOCALIZATION;  Surgeon: [name], MD;  Location: [facility];  Service: General;  Laterality: Left;  RNFA   CARPAL TUNNEL RELEASE Left 2010 or 2011   CATARACT EXTRACTION W/ INTRAOCULAR LENS  IMPLANT, BILATERAL Bilateral    COLONOSCOPY W/ POLYPECTOMY  03/27/2007; n7/19/2012   2008: 4 mm adenoma, diverticulosis 2012: ileitis ? NSAID - likely, 2-3 mm cecal polyp LYMPHOID FOLLICLE, diverticulosis   ESOPHAGOGASTRODUODENOSCOPY  01/26/2011   reflux esophagitis, colonscopy done also   HAMMER TOE SURGERY  2008   left foot   HERNIA REPAIR Right 1996?   IR ANGIO INTRA EXTRACRAN SEL COM CAROTID INNOMINATE BILAT MOD SED  10/25/2021   IR ANGIO INTRA EXTRACRAN SEL COM CAROTID INNOMINATE UNI R MOD SED  11/28/2018   IR ANGIO INTRA EXTRACRAN SEL COM CAROTID INNOMINATE UNI R MOD SED  03/17/2021   IR ANGIO INTRA EXTRACRAN SEL INTERNAL CAROTID UNI R MOD SED  12/30/2018   IR ANGIO VERTEBRAL SEL SUBCLAVIAN INNOMINATE UNI R  MOD SED  11/28/2018   IR ANGIO VERTEBRAL SEL SUBCLAVIAN INNOMINATE UNI R MOD SED  10/26/2021   IR ANGIO VERTEBRAL SEL VERTEBRAL UNI R MOD SED  03/18/2021   IR ANGIOGRAM FOLLOW UP STUDY  12/30/2018   IR TRANSCATH/EMBOLIZ  12/30/2018   IR US GUIDE VASC ACCESS RIGHT  11/28/2018   IR US GUIDE VASC ACCESS RIGHT  03/17/2021   ORIF ANKLE FRACTURE Right 12/22/2014   Procedure: OPEN REDUCTION INTERNAL FIXATION (ORIF) ANKLE FRACTURE;  Surgeon: Susa Day, MD;  Location: [facility];  Service: Orthopedics;  Laterality: Right;   RADIOLOGY WITH ANESTHESIA N/A 12/30/2018   Procedure: RADIOLOGY WITH ANESTHESIA;  Surgeon: Luanne Bras, MD;   Location: [facility];  Service: Radiology;  Laterality: N/A;   SHOULDER OPEN ROTATOR CUFF REPAIR Left 03/06/2013   Procedure: LEFT ROTATOR CUFF REPAIR, SUBACROMIAL DECOMPRESSION, PATCH GRAFT, MANIPULATION UNDER ANESTHESIA;  Surgeon: Johnn Hai, MD;  Location: [facility];  Service: Orthopedics;  Laterality: Left;   TONSILLECTOMY     Social History:  reports that she quit smoking about 37 years ago. Her smoking use included cigarettes. She has a 60.00 pack-year smoking history. She has never used smokeless tobacco. She reports that she does not drink alcohol and does not use drugs.  Allergies  Allergen Reactions   Alcohol Other (See Comments)    "Recovering Alcoholic*   Cheese Nausea And Vomiting   Dilaudid [Hydromorphone Hcl] Other (See Comments)    Pt had hypotension after dilaudid 1mg  IV while in the OR, required temporary pressor intervention.   Phenytoin Sodium Extended Other (See Comments)    Reaction not cited   Diclofenac Sodium Rash    Family History  Problem Relation Age of Onset   Throat cancer Mother        38   Heart attack Father 44   Idiopathic pulmonary fibrosis Brother 35   Cancer Brother        sarcoma   Heart disease Brother    Colon cancer Neg Hx    Esophageal cancer Neg Hx    Stomach cancer Neg Hx     Prior to Admission medications   Medication Sig Start Date End Date Taking? Authorizing Provider  albuterol (PROAIR HFA) 108 (90 Base) MCG/ACT inhaler INHALE 2 PUFFS EVERY 4 HOURS AS NEEDED FOR COUGHING SPELLS Patient taking differently: Inhale 2 puffs into the lungs every 4 (four) hours as needed ("for coughing spells"). 05/24/20   Marin Olp, MD  aspirin 325 MG tablet Take 1 tablet (325 mg total) by mouth daily. 01/01/19   Louk, Bea Graff, PA-C  azelastine (ASTELIN) 0.1 % nasal spray Place 2 sprays into both nostrils 2 (two) times daily. 04/22/22   [provider]  benzonatate (TESSALON) 200 MG capsule Take 1 capsule (200 mg total) by mouth 3  (three) times daily as needed for cough. Patient taking differently: Take 200 mg by mouth See admin instructions. Take 200 mg by mouth in the morning and an additional 200 mg two times a day as needed for coughing 02/21/22   Marin Olp, MD  Budeson-Glycopyrrol-Formoterol (BREZTRI AEROSPHERE) 160-9-4.8 MCG/ACT AERO Inhale 2 puffs into the lungs in the morning and at bedtime. 04/26/22   Cobb, Karie Schwalbe, NP  budesonide (PULMICORT FLEXHALER) 180 MCG/ACT inhaler Inhale 1 puff into the lungs 2 (two) times daily. 04/28/22 04/28/23  Cobb, Karie Schwalbe, NP  Cholecalciferol (VITAMIN D3) 50 MCG (2000 UT) TABS Take 2,000 Units by mouth in the morning.    [provider]  clopidogrel (PLAVIX) 75 MG tablet Take 0.5 tablets (37.5 mg total) by mouth daily. 08/03/21   Marin Olp, MD  Coenzyme Q10 300 MG CAPS Take 300 mg by mouth daily.    [provider]  fexofenadine (ALLEGRA) 180 MG tablet Take 180 mg by mouth daily.    [provider]  folic acid (FOLVITE) 1 MG tablet TAKE 1 TABLET(1 MG) BY MOUTH DAILY Patient taking differently: Take 1 mg by mouth daily. 03/15/22   Curt Bears, MD  guaiFENesin (MUCINEX) 600 MG 12 hr tablet Take 1 tablet by mouth daily.    [provider]  lactose free nutrition (BOOST) LIQD Take 237 mLs by mouth daily.    [provider]  montelukast (SINGULAIR) 10 MG tablet TAKE 1 TABLET(10 MG) BY MOUTH AT BEDTIME 04/21/22   Cobb, Karie Schwalbe, NP  Multiple Vitamin (MULTIVITAMIN WITH MINERALS) TABS tablet Take 1 tablet by mouth daily with breakfast.    [provider]  pantoprazole (PROTONIX) 40 MG tablet TAKE 1 TABLET(40 MG) BY MOUTH DAILY 04/21/22   Cobb, Karie Schwalbe, NP  potassium chloride SA (KLOR-CON M) 20 MEQ tablet Take 20 mEq by mouth every other day. 04/01/22   [provider]  promethazine-dextromethorphan (PROMETHAZINE-DM) 6.25-15 MG/5ML syrup Take 5 mLs by mouth 4 (four) times daily as needed for cough.  04/26/22   Cobb, Karie Schwalbe, NP  rosuvastatin (CRESTOR) 10 MG tablet Take 1 tablet (10 mg total) by mouth daily. 05/25/22   Marin Olp, MD  tamoxifen (NOLVADEX) 20 MG tablet Take 1 tablet (20 mg total) by mouth daily. 09/12/21   Alla Feeling, NP  traMADol (ULTRAM) 50 MG tablet TAKE 1 TABLET BY MOUTH EVERY 8 HOURS FOR UP TO 5 DAYS AS NEEDED FOR PAIN. DO NOT DRIVE FOR 8 HOURS AFTER TAKING Patient taking differently: Take 50 mg by mouth in the morning and at bedtime. 03/08/22   Marin Olp, MD  traZODone (DESYREL) 50 MG tablet TAKE 1/2- 1 TABLET BY MOUTH EVERY NIGHT AT BEDTIME AS NEEDED SLEEP Patient taking differently: Take 50 mg by mouth at bedtime. 01/18/22   Marin Olp, MD  TYLENOL 8 HOUR ARTHRITIS PAIN 650 MG CR tablet Take 1,300 mg by mouth in the morning and at bedtime.    [provider]  vitamin B-12 (CYANOCOBALAMIN) 1000 MCG tablet Take 1,000 mcg by mouth daily.    [provider]    Physical Exam: Vitals:   05/29/22 1730 05/29/22 1800 05/29/22 1830 05/29/22 1900  BP: (!) 140/65 110/60 107/76 95/77  Pulse: 93 96 87 82  Resp: (!) 22 (!) 23 (!) 23 (!) 23  Temp:      TempSrc:      SpO2: 100% 99% 92% 93%  Weight:      Height:       Constitutional: Chronically ill looking, no distress NAD, calm, comfortable Eyes: PERRL, lids and conjunctivae normal ENMT: Mucous membranes are moist. Posterior pharynx clear of any exudate or lesions.Normal dentition.  Neck: normal, supple, no masses, no thyromegaly Respiratory: Decreased air entry bilaterally with marked expiratory wheezing,. No accessory muscle use.  Cardiovascular: Sinus tachycardia, no murmurs / rubs / gallops. No extremity edema. 2+ pedal pulses. No carotid bruits.  Abdomen: no tenderness, no masses palpated. No hepatosplenomegaly. Bowel sounds positive.  Musculoskeletal: Good range of motion, no joint swelling or tenderness, Skin: no rashes, lesions, ulcers. No induration Neurologic: CN 2-12  grossly intact. Sensation intact, DTR normal. Strength 5/5 in  all 4.  Psychiatric: Normal judgment and insight. Alert and oriented x 3. Normal mood  Data Reviewed:  Temperature 98.5, blood pressure 88/50, pulse 117, respiratory 32 and oxygen sat 71% on 4 L.  Currently 93% on 10 L.  VBG showed a pH of 7.45.  Glucose is 115.  BNP of 500.  Hemoglobin is 8.7 white count 7.8 and platelets 206.  Influenza AB and COVID-19 negative.  Chest x-ray showed no change from diffuse peripheral predominant interstitial disease.  CT angiogram of the chest showed no acute PE.  Severe coronary lung disease with diffuse interstitial thickening and patchy airspace opacities which has progressed.  Findings may reflect superimposed edema or atypical infection.  Assessment and Plan:  #1 acute on chronic respiratory failure with hypoxemia: Suspected due to COPD exacerbation.  Patient already on 4 L of oxygen at home now requiring up to 10 L.  We will continue with oxygen, IV steroids, antibiotics, breathing treatments.  Goal is to titrate it down as much as possible close to her baseline.  No evidence of active pneumonia.  #2 stage IV lung cancer: Continue follow-up with Dr. Julien Nordmann  #3 COPD exacerbation: As per #1 above.  #4 essential hypertension: Blood pressure appears controlled on home regimen.  No change in treatment.  #5 GERD: Continue with PPIs.  #6 hyperlipidemia: Patient is on Crestor.  Continue  #7 history of breast cancer: Currently on tamoxifen.  Continue    Advance Care Planning:   Code Status: Prior full code  Consults: Dr. Lindi Adie, oncology  Family Communication: No family at bedside  Severity of Illness: The appropriate patient status for this patient is INPATIENT. Inpatient status is judged to be reasonable and necessary in order to provide the required intensity of service to ensure the patient's safety. The patient's presenting symptoms, physical exam findings, and initial radiographic and  laboratory data in the context of their chronic comorbidities is felt to place them at high risk for further clinical deterioration. Furthermore, it is not anticipated that the patient will be medically stable for discharge from the hospital within 2 midnights of admission.   * I certify that at the point of admission it is my clinical judgment that the patient will require inpatient hospital care spanning beyond 2 midnights from the point of admission due to high intensity of service, high risk for further deterioration and high frequency of surveillance required.*  AuthorBarbette Merino, MD 05/29/2022 7:15 PM  For on call review [email or web address].

## 2022-05-29 NOTE — ED Triage Notes (Signed)
Pt BIBA from home. Pt has had 4 days of coughing with movement. Pt has hx of resp failure, COPD, lung CA. Last tx Wednesday. Pt has had increased weakness since. Pt had wheezing, cyanotic around mouth. Pt wears 4-5L at baseline. Pt satting 44% on 5L on EMS arrival, sitting helped.  Pt has received 5 albuterol, 0.5 atrovent, 125 solumedrol. Pt feels very improved after these treatments.

## 2022-05-29 NOTE — ED Provider Notes (Addendum)
Patient underwent CT which shows no evidence of a PE but having significant COPD with atypical infection versus edema.  BNP is mildly elevated beyond her baseline at 500 but she does not have distal edema or significant findings for fluid overload.  Patient requires admission today due to her increased oxygen requirement.  She is typically on 4 L at home and today she is requiring 10 L of humidified oxygen to stay at 88 to 91%.  She is diminished throughout but concern for possible atypical infection and COPD exacerbation.  She is already received Solu-Medrol.  We will hold on abx at this time and admit for further care.   Blanchie Dessert, MD 05/29/22 Aggie Moats, MD 05/29/22 [phone]

## 2022-05-29 NOTE — ED Provider Notes (Signed)
[facility] DEPT Provider Note   CSN: [id number] Arrival date & time: 05/29/22  1157     History {Add pertinent medical, surgical, social history, OB history to HPI:1} Chief Complaint  Patient presents with  . Shortness of Breath    Sheila Shields is a 87 y.o. female.   Shortness of Breath     patient has a history of hyperlipidemia hypertension arthritis COPD on chronic oxygen, stage IV adenocarcinoma of the lung who presents to the ED with complaints of shortness of breath.  Patient states her symptoms started several days ago.  Patient states her symptoms progressed over the weekend.  She became more short of breath.  She has been coughing.  She denies any leg swelling.  No known fevers.  Home Medications Prior to Admission medications   Medication Sig Start Date End Date Taking? Authorizing Provider  albuterol (PROAIR HFA) 108 (90 Base) MCG/ACT inhaler INHALE 2 PUFFS EVERY 4 HOURS AS NEEDED FOR COUGHING SPELLS Patient taking differently: Inhale 2 puffs into the lungs every 4 (four) hours as needed ("for coughing spells"). 05/24/20   Marin Olp, MD  aspirin 325 MG tablet Take 1 tablet (325 mg total) by mouth daily. 01/01/19   Louk, Bea Graff, PA-C  azelastine (ASTELIN) 0.1 % nasal spray Place 2 sprays into both nostrils 2 (two) times daily. 04/22/22   [provider]  benzonatate (TESSALON) 200 MG capsule Take 1 capsule (200 mg total) by mouth 3 (three) times daily as needed for cough. Patient taking differently: Take 200 mg by mouth See admin instructions. Take 200 mg by mouth in the morning and an additional 200 mg two times a day as needed for coughing 02/21/22   Marin Olp, MD  Budeson-Glycopyrrol-Formoterol (BREZTRI AEROSPHERE) 160-9-4.8 MCG/ACT AERO Inhale 2 puffs into the lungs in the morning and at bedtime. 04/26/22   Cobb, Karie Schwalbe, NP  budesonide (PULMICORT FLEXHALER) 180 MCG/ACT inhaler Inhale 1 puff into the lungs  2 (two) times daily. 04/28/22 04/28/23  Cobb, Karie Schwalbe, NP  Cholecalciferol (VITAMIN D3) 50 MCG (2000 UT) TABS Take 2,000 Units by mouth in the morning.    [provider]  clopidogrel (PLAVIX) 75 MG tablet Take 0.5 tablets (37.5 mg total) by mouth daily. 08/03/21   Marin Olp, MD  Coenzyme Q10 300 MG CAPS Take 300 mg by mouth daily.    [provider]  fexofenadine (ALLEGRA) 180 MG tablet Take 180 mg by mouth daily.    [provider]  folic acid (FOLVITE) 1 MG tablet TAKE 1 TABLET(1 MG) BY MOUTH DAILY Patient taking differently: Take 1 mg by mouth daily. 03/15/22   Curt Bears, MD  guaiFENesin (MUCINEX) 600 MG 12 hr tablet Take 1 tablet by mouth daily.    [provider]  lactose free nutrition (BOOST) LIQD Take 237 mLs by mouth daily.    [provider]  montelukast (SINGULAIR) 10 MG tablet TAKE 1 TABLET(10 MG) BY MOUTH AT BEDTIME 04/21/22   Cobb, Karie Schwalbe, NP  Multiple Vitamin (MULTIVITAMIN WITH MINERALS) TABS tablet Take 1 tablet by mouth daily with breakfast.    [provider]  pantoprazole (PROTONIX) 40 MG tablet TAKE 1 TABLET(40 MG) BY MOUTH DAILY 04/21/22   Cobb, Karie Schwalbe, NP  potassium chloride SA (KLOR-CON M) 20 MEQ tablet Take 20 mEq by mouth every other day. 04/01/22   [provider]  promethazine-dextromethorphan (PROMETHAZINE-DM) 6.25-15 MG/5ML syrup Take 5 mLs by mouth 4 (four) times  daily as needed for cough. 04/26/22   Cobb, Karie Schwalbe, NP  rosuvastatin (CRESTOR) 10 MG tablet Take 1 tablet (10 mg total) by mouth daily. 05/25/22   Marin Olp, MD  tamoxifen (NOLVADEX) 20 MG tablet Take 1 tablet (20 mg total) by mouth daily. 09/12/21   Alla Feeling, NP  traMADol (ULTRAM) 50 MG tablet TAKE 1 TABLET BY MOUTH EVERY 8 HOURS FOR UP TO 5 DAYS AS NEEDED FOR PAIN. DO NOT DRIVE FOR 8 HOURS AFTER TAKING Patient taking differently: Take 50 mg by mouth in the morning and at bedtime. 03/08/22   Marin Olp, MD  traZODone (DESYREL) 50 MG tablet TAKE 1/2- 1 TABLET BY MOUTH EVERY NIGHT AT BEDTIME AS NEEDED SLEEP Patient taking differently: Take 50 mg by mouth at bedtime. 01/18/22   Marin Olp, MD  TYLENOL 8 HOUR ARTHRITIS PAIN 650 MG CR tablet Take 1,300 mg by mouth in the morning and at bedtime.    [provider]  vitamin B-12 (CYANOCOBALAMIN) 1000 MCG tablet Take 1,000 mcg by mouth daily.    [provider]      Allergies    Alcohol, Cheese, Dilaudid [hydromorphone hcl], Phenytoin sodium extended, and Diclofenac sodium    Review of Systems   Review of Systems  Respiratory:  Positive for shortness of breath.     Physical Exam Updated Vital Signs BP (!) 114/91   Pulse 97   Temp 98.5 F (36.9 C) (Oral)   Resp (!) 26   Ht 1.651 m (5\' 5" )   Wt 70.3 kg   SpO2 99%   BMI 25.79 kg/m  Physical Exam Vitals and nursing note reviewed.  Constitutional:      Appearance: She is not diaphoretic.     Comments: Able to speak in full sentences  HENT:     Head: Normocephalic and atraumatic.     Right Ear: External ear normal.     Left Ear: External ear normal.  Eyes:     General: No scleral icterus.       Right eye: No discharge.        Left eye: No discharge.     Conjunctiva/sclera: Conjunctivae normal.  Neck:     Trachea: No tracheal deviation.  Cardiovascular:     Rate and Rhythm: Normal rate and regular rhythm.  Pulmonary:     Effort: Tachypnea and accessory muscle usage present. No respiratory distress.     Breath sounds: No stridor. Rales present. No wheezing.  Abdominal:     General: Bowel sounds are normal. There is no distension.     Palpations: Abdomen is soft.     Tenderness: There is no abdominal tenderness. There is no guarding or rebound.  Musculoskeletal:        General: No tenderness or deformity.     Cervical back: Neck supple.  Skin:    General: Skin is warm and dry.     Findings: No rash.  Neurological:     General: No focal  deficit present.     Mental Status: She is alert.     Cranial Nerves: No cranial nerve deficit (no facial droop, extraocular movements intact, no slurred speech).     Sensory: No sensory deficit.     Motor: No abnormal muscle tone or seizure activity.     Coordination: Coordination normal.  Psychiatric:        Mood and Affect: Mood normal.     ED Results / Procedures / Treatments  Labs (all labs ordered are listed, but only abnormal results are displayed) Labs Reviewed  BASIC METABOLIC PANEL - Abnormal; Notable for the following components:      Result Value   Glucose, Bld 115 (*)    Calcium 8.6 (*)    All other components within normal limits  CBC - Abnormal; Notable for the following components:   RBC 2.45 (*)    Hemoglobin 8.7 (*)    HCT 27.3 (*)    MCV 111.4 (*)    MCH 35.5 (*)    All other components within normal limits  BLOOD GAS, VENOUS - Abnormal; Notable for the following components:   pH, Ven 7.45 (*)    pO2, Ven 53 (*)    Bicarbonate 30.6 (*)    Acid-Base Excess 5.8 (*)    All other components within normal limits  RESP PANEL BY RT-PCR (FLU A&B, COVID) [id number]  BRAIN NATRIURETIC PEPTIDE    EKG EKG Interpretation  Date/Time:  Monday May 29 2022 12:06:31 EST Ventricular Rate:  114 PR Interval:  183 QRS Duration: 139 QT Interval:  351 QTC Calculation: 484 R Axis:   52 Text Interpretation: Sinus tachycardia Right bundle branch block Since last tracing rate faster Confirmed by Dorie Rank [phone]) on 05/29/2022 12:28:44 PM  Radiology DG Chest Portable 1 View  Result Date: 05/29/2022 CLINICAL DATA:  Shortness of breath. EXAM: PORTABLE CHEST 1 VIEW COMPARISON:  04/15/2022 FINDINGS: Low lung volumes. The diffuse peripherally predominant interstitial disease is similar in the interval. Slight improvement in aeration at the left base since prior study. The cardiopericardial silhouette is within normal limits for size. The visualized bony structures of the  thorax are unremarkable. Telemetry leads overlie the chest. IMPRESSION: Similar appearance of the diffuse peripherally predominant interstitial disease with slight improvement in aeration at the left base. Electronically Signed   By: Misty Stanley M.D.   On: 05/29/2022 12:52    Procedures Procedures  {Document cardiac monitor, telemetry assessment procedure when appropriate:1}  Medications Ordered in ED Medications  ipratropium-albuterol (DUONEB) 0.5-2.5 (3) MG/3ML nebulizer solution 3 mL (3 mLs Nebulization Given 05/29/22 1339)  methylPREDNISolone sodium succinate (SOLU-MEDROL) 125 mg/2 mL injection 125 mg (125 mg Intravenous Given 05/29/22 1339)  sodium chloride 0.9 % bolus 500 mL ( Intravenous Stopped 05/29/22 1441)    ED Course/ Medical Decision Making/ A&P Clinical Course as of 05/29/22 1621  Mon May 29, 2022  1240 Sats in the 60s and 70s at the bedside on my exam.  Patient started on nonrebreather [JK]  1325 CBC(!) Hemoglobin decreased compared to previous [JK]  1326 DG Chest Portable 1 View Chest x-ray shows persistent interstitial disease [JK]  1326 Resp Panel by RT-PCR (Flu A&B, Covid) Anterior Nasal Swab Negative [JK]  1459 CT Angio Chest PE W and/or Wo Contrast CT angio ordered.  Results are pending [SA]  6301 Patient feeling much better after her oxygen treatment [JK]    Clinical Course User Index [JK] Dorie Rank, MD                           Medical Decision Making   Differential diagnosis includes but not limited to, COPD, pneumothorax, pneumonia, CHF exacerbation, pulmonary embolism  Amount and/or Complexity of Data Reviewed Labs: ordered. Decision-making details documented in ED Course. Radiology: ordered. Decision-making details documented in ED Course.  Risk Prescription drug management.   Presented to ED for acute shortness of breath.  She does have chronic lung disease  but had worsening oxygen requirement.  No evidence of definite pneumonia on x-ray.  No  signs of CHF exacerbation.  {Document critical care time when appropriate:1} {Document review of labs and clinical decision tools ie heart score, Chads2Vasc2 etc:1}  {Document your independent review of radiology images, and any outside records:1} {Document your discussion with family members, caretakers, and with consultants:1} {Document social determinants of health affecting pt's care:1} {Document your decision making why or why not admission, treatments were needed:1} Final Clinical Impression(s) / ED Diagnoses Final diagnoses:  None    Rx / DC Orders ED Discharge Orders     None

## 2022-05-29 NOTE — ED Notes (Signed)
ED TO INPATIENT HANDOFF REPORT  Name/Age/Gender Sheila Shields 87 y.o. female  Code Status Code Status History     Date Active Date Inactive Code Status Order ID Comments User Context   04/15/2022 1747 04/18/2022 1704 Full Code [id number]  Sheila Milan, MD ED   12/30/2021 0324 01/03/2022 1658 DNR [id number]  Sheila Leff, MD ED   12/30/2021 0256 12/30/2021 0324 Full Code [id number]  Sheila Leff, MD ED   12/30/2021 0128 12/30/2021 0256 Full Code [id number]  Sheila Oppenheim, DO ED   12/22/2014 2150 12/26/2014 1420 Full Code [id number]  Sheila Kicks, PA-C Inpatient      Advance Directive Documentation    Flowsheet Row Most Recent Value  Type of Advance Directive Healthcare Power of Attorney, Living will  Pre-existing out of facility DNR order (yellow form or pink MOST form) --  "MOST" Form in Place? --       Home/SNF/Other Home  Chief Complaint Acute on chronic hypoxic respiratory failure (HCC) [J96.21]  Level of Care/Admitting Diagnosis ED Disposition     ED Disposition  Admit   Condition  --   [place]: Sheila Shields [[id number]]  Level of Care: Progressive [102]  Admit to Progressive based on following criteria: RESPIRATORY PROBLEMS hypoxemic/hypercapnic respiratory failure that is responsive to NIPPV (BiPAP) or High Flow Nasal Cannula (6-80 lpm). Frequent assessment/intervention, no > Q2 hrs < Q4 hrs, to maintain oxygenation and pulmonary hygiene.  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 meds short term during life transition   Pneumonia    AS A CHILD   Rectal polyp 03/27/2007   adenoma   SOB (shortness of breath) on exertion    per patient due to having COPD   TIA (transient ischemic attack)     Allergies Allergies  Allergen Reactions   Alcohol Other (See Comments)    "Recovering Alcoholic*   Cheese Nausea And Vomiting   Dilaudid [Hydromorphone Hcl] Other (See Comments)    Pt had hypotension after dilaudid 1mg  IV while in the OR, required temporary pressor intervention.   Phenytoin Sodium Extended Other (See Comments)    Reaction not cited   Diclofenac Sodium Rash    IV Location/Drains/Wounds Patient Lines/Drains/Airways Status     Active Line/Drains/Airways     Name Placement date Placement time Site Days   Peripheral IV 05/29/22 22 G Left Hand 05/29/22  --  Hand  less than 1   Peripheral IV 05/29/22 20 G 1.88" Anterior;Left;Upper Arm 05/29/22  1614  Arm  less than 1   External Urinary Catheter 05/29/22  1230  --  less than 1   Incision (Closed) 12/22/14 Ankle 12/22/14  2021  -- 2715   Incision (Closed) 03/23/20 Breast Left 03/23/20  1123  -- 797   Incision (Closed) 10/25/21 Groin Right 10/25/21  0909  -- 216   Incision (Closed) 11/17/21 Hip Right 11/17/21  1057  -- 193   Wound / Incision (Open or Dehisced) 03/17/21 Puncture Arm Anterior;Distal;Lower;Right Arterial access puncture site 03/17/21  0950  Arm  438            Labs/Imaging Results for orders placed or performed during the hospital encounter of 05/29/22 (from the past 48 hour(s))  Basic metabolic panel     Status: Abnormal   Collection Time: 05/29/22 12:20 PM  Result  Value Ref Range   Sodium 139 135 - 145 mmol/L   Potassium 3.7 3.5 - 5.1 mmol/L   Chloride 102 98 - 111 mmol/L   CO2 27 22 - 32 mmol/L   Glucose, Bld 115 (H) 70 - 99 mg/dL    Comment: Glucose reference range applies only to samples taken after fasting for at least 8 hours.   BUN 16 8 - 23 mg/dL   Creatinine, Ser 0.71 0.44 - 1.00 mg/dL   Calcium 8.6 (L) 8.9 - 10.3 mg/dL   GFR, Estimated >60 >60 mL/min    Comment: (NOTE) Calculated using the CKD-EPI Creatinine Equation (2021)    Anion gap 10 5 - 15    Comment: Performed at [facility], [place] [address] [place], [place] [address]  CBC     Status: Abnormal   Collection Time: 05/29/22 12:20 PM  Result Value Ref Range   WBC 7.8 4.0 - 10.5 K/uL   RBC 2.45 (L) 3.87 - 5.11 MIL/uL   Hemoglobin 8.7 (L) 12.0 - 15.0 g/dL   HCT 27.3 (L) 36.0 - 46.0 %   MCV 111.4 (H) 80.0 - 100.0 fL   MCH 35.5 (H) 26.0 - 34.0 pg   MCHC 31.9 30.0 - 36.0 g/dL   RDW 15.2 11.5 - 15.5 %   Platelets 206 150 - 400 K/uL   nRBC 0.0 0.0 - 0.2 %    Comment: Performed at [facility], [place] [address], [place], [place] [address]  Brain natriuretic peptide     Status: Abnormal   Collection Time: 05/29/22 12:20 PM  Result Value Ref Range   B Natriuretic Peptide 500.5 (H) 0.0 - 100.0 pg/mL    Comment: Performed at [facility], [place] [address] [place], [place] [address]  Resp Panel by RT-PCR (Flu A&B, Covid) Anterior Nasal Swab     Status: None   Collection Time: 05/29/22 12:28 PM   Specimen: Anterior Nasal Swab  Result Value Ref Range   SARS Coronavirus 2 by RT PCR NEGATIVE NEGATIVE    Comment: (NOTE) SARS-CoV-2 target nucleic acids are NOT DETECTED.  The SARS-CoV-2 RNA is generally detectable in upper respiratory specimens during the acute phase of infection. The lowest concentration of SARS-CoV-2 viral copies this assay can detect is 138 copies/mL. A negative result does not preclude SARS-Cov-2 infection and  should not be used as the sole basis for treatment or other patient management decisions. A negative result may occur with  improper specimen collection/handling, submission of specimen other than nasopharyngeal swab, presence of viral mutation(s) within the areas targeted by this assay, and inadequate number of viral copies(<138 copies/mL). A negative result must be combined with clinical observations, patient history, and epidemiological information. The expected result is Negative.  Fact Sheet for Patients:  EntrepreneurPulse.com.au  Fact Sheet for  Healthcare Providers:  IncredibleEmployment.be  This test is no t yet approved or cleared by the [place] and  has been authorized for detection and/or diagnosis of SARS-CoV-2 by FDA under an Emergency Use Authorization (EUA). This EUA will remain  in effect (meaning this test can be used) for the duration of the COVID-19 declaration under Section 564(b)(1) of the Act, 21 U.S.C.section 360bbb-3(b)(1), unless the authorization is terminated  or revoked sooner.       Influenza A by PCR NEGATIVE NEGATIVE   Influenza B by PCR NEGATIVE NEGATIVE    Comment: (NOTE) The Xpert Xpress SARS-CoV-2/FLU/RSV plus assay is intended as an aid in the diagnosis of influenza from Nasopharyngeal swab specimens and should not be used as a sole basis for treatment. Nasal washings and aspirates are unacceptable for Xpert Xpress SARS-CoV-2/FLU/RSV testing.  Fact Sheet for Patients: EntrepreneurPulse.com.au  Fact Sheet for Healthcare Providers: IncredibleEmployment.be  This test is not yet approved or cleared by the [place] FDA and has been authorized for detection and/or diagnosis of SARS-CoV-2 by FDA under an Emergency Use Authorization (EUA). This EUA will remain in effect (meaning this test can be used) for the duration of the COVID-19 declaration under Section  564(b)(1) of the Act, 21 U.S.C. section 360bbb-3(b)(1), unless the authorization is terminated or revoked.  Performed at [facility], [place] [address], [place], [place] [address]   Blood gas, venous (at [facility] and [facility], not at [facility])     Status: Abnormal   Collection Time: 05/29/22  1:12 PM  Result Value Ref Range   pH, Ven 7.45 (H) 7.25 - 7.43   pCO2, Ven 44 44 - 60 mmHg   pO2, Ven 53 (H) 32 - 45 mmHg   Bicarbonate 30.6 (H) 20.0 - 28.0 mmol/L   Acid-Base Excess 5.8 (H) 0.0 - 2.0 mmol/L   O2 Saturation 85.1 %   Patient temperature 37.0     Comment: Performed at [facility], [place] [address], [place], [place] [address]   CT Angio Chest PE W and/or Wo Contrast  Result Date: 05/29/2022 CLINICAL DATA:  Cough for 4 days. History of respiratory failure, COPD and lung cancer. Increasing weakness. EXAM: CT ANGIOGRAPHY CHEST WITH CONTRAST TECHNIQUE: Multidetector CT imaging of the chest was performed using the standard protocol during bolus administration of intravenous contrast. Multiplanar CT image reconstructions and MIPs were obtained to evaluate the vascular anatomy. RADIATION DOSE REDUCTION: This exam was performed according to the departmental dose-optimization program which includes automated exposure control, adjustment of the mA and/or kV according to patient size and/or use of iterative reconstruction technique. CONTRAST:  13mL OMNIPAQUE IOHEXOL 350 MG/ML SOLN COMPARISON:  Chest CT 04/17/2022 and 02/27/2022. FINDINGS: Cardiovascular: Study is mildly degraded by breathing artifact. The pulmonary arteries are well opacified with contrast to the level of the subsegmental branches. There is no evidence of acute pulmonary embolism. There is limited enhancement of the systemic vessels which demonstrate extensive atherosclerosis. No acute systemic arterial abnormalities are identified. The heart size is normal. There is no pericardial effusion. Mediastinum/Nodes: There  are no enlarged mediastinal, hilar or axillary lymph nodes.Unchanged appearance of partially calcified right thyroid nodule. Although previously hypermetabolic on PET-CT, given comorbidities, additional evaluation not likely warranted. Lungs/Pleura: Small left pleural effusion. Severe chronic lung disease with centrilobular and paraseptal emphysema, diffuse interstitial thickening and patchy airspace opacities which have progressed compared with the prior CT. Upper abdomen: The visualized upper abdomen appears stable without significant findings. Previously characterized hepatic cysts  are grossly unchanged. Musculoskeletal/Chest wall: No acute osseous findings. Sclerotic metastasis within the T3 vertebral body and multilevel thoracolumbar spondylosis again noted. Review of the MIP images confirms the above findings. IMPRESSION: 1. No evidence of acute pulmonary embolism or other acute vascular findings in the chest. 2. Severe chronic lung disease with diffuse interstitial thickening and patchy airspace opacities which have progressed compared with the prior CT. Findings may reflect superimposed edema or atypical infection. Stable small left pleural effusion. 3. Aortic Atherosclerosis (ICD10-I70.0) and Emphysema (ICD10-J43.9). Electronically Signed   By: Richardean Sale M.D.   On: 05/29/2022 18:20   DG Chest Portable 1 View  Result Date: 05/29/2022 CLINICAL DATA:  Shortness of breath. EXAM: PORTABLE CHEST 1 VIEW COMPARISON:  04/15/2022 FINDINGS: Low lung volumes. The diffuse peripherally predominant interstitial disease is similar in the interval. Slight improvement in aeration at the left base since prior study. The cardiopericardial silhouette is within normal limits for size. The visualized bony structures of the thorax are unremarkable. Telemetry leads overlie the chest. IMPRESSION: Similar appearance of the diffuse peripherally predominant interstitial disease with slight improvement in aeration at the left  base. Electronically Signed   By: Misty Stanley M.D.   On: 05/29/2022 12:52    Pending Labs [employer] (From admission, onward)     Start     Ordered   Signed and Held  CBC  (enoxaparin (LOVENOX)    CrCl >/= 30 ml/min)  Once,   R       Comments: Baseline for enoxaparin therapy IF NOT ALREADY DRAWN.  Notify MD if PLT < 100 K.    Signed and Held   Signed and Held  Creatinine, serum  (enoxaparin (LOVENOX)    CrCl >/= 30 ml/min)  Once,   R       Comments: Baseline for enoxaparin therapy IF NOT ALREADY DRAWN.    Signed and Held   Signed and Held  Creatinine, serum  (enoxaparin (LOVENOX)    CrCl >/= 30 ml/min)  Weekly,   R     Comments: while on enoxaparin therapy    Signed and Held   Signed and Held  CBC  Tomorrow morning,   R        Signed and Held   Signed and Held  Comprehensive metabolic panel  Tomorrow morning,   R        Signed and Held            Vitals/Pain Today's Vitals   05/29/22 1730 05/29/22 1800 05/29/22 1830 05/29/22 1900  BP: (!) 140/65 110/60 107/76 95/77  Pulse: 93 96 87 82  Resp: (!) 22 (!) 23 (!) 23 (!) 23  Temp:      TempSrc:      SpO2: 100% 99% 92% 93%  Weight:      Height:      PainSc:        Isolation Precautions No active isolations  Medications Medications  ipratropium-albuterol (DUONEB) 0.5-2.5 (3) MG/3ML nebulizer solution 3 mL (3 mLs Nebulization Given 05/29/22 1339)  methylPREDNISolone sodium succinate (SOLU-MEDROL) 125 mg/2 mL injection 125 mg (125 mg Intravenous Given 05/29/22 1339)  sodium chloride 0.9 % bolus 500 mL ( Intravenous Stopped 05/29/22 1441)  iohexol (OMNIPAQUE) 350 MG/ML injection 75 mL (75 mLs Intravenous Contrast Given 05/29/22 1739)    Mobility walks with person assist

## 2022-05-30 DIAGNOSIS — J9621 Acute and chronic respiratory failure with hypoxia: Secondary | ICD-10-CM | POA: Diagnosis not present

## 2022-05-30 LAB — COMPREHENSIVE METABOLIC PANEL
ALT: 9 U/L (ref 0–44)
AST: 23 U/L (ref 15–41)
Albumin: 2.4 g/dL — ABNORMAL LOW (ref 3.5–5.0)
Alkaline Phosphatase: 40 U/L (ref 38–126)
Anion gap: 7 (ref 5–15)
BUN: 17 mg/dL (ref 8–23)
CO2: 27 mmol/L (ref 22–32)
Calcium: 9.1 mg/dL (ref 8.9–10.3)
Chloride: 106 mmol/L (ref 98–111)
Creatinine, Ser: 0.58 mg/dL (ref 0.44–1.00)
GFR, Estimated: >60 mL/min (ref >60)
Glucose, Bld: 130 mg/dL — ABNORMAL HIGH (ref 70–99)
Potassium: 4.4 mmol/L (ref 3.5–5.1)
Sodium: 140 mmol/L (ref 135–145)
Total Bilirubin: 0.4 mg/dL (ref 0.3–1.2)
Total Protein: 7 g/dL (ref 6.5–8.1)

## 2022-05-30 LAB — CBC
HCT: 27.4 % — ABNORMAL LOW (ref 36.0–46.0)
Hemoglobin: 8.6 g/dL — ABNORMAL LOW (ref 12.0–15.0)
MCH: 34.8 pg — ABNORMAL HIGH (ref 26.0–34.0)
MCHC: 31.4 g/dL (ref 30.0–36.0)
MCV: 110.9 fL — ABNORMAL HIGH (ref 80.0–100.0)
Platelets: 169 10*3/uL (ref 150–400)
RBC: 2.47 MIL/uL — ABNORMAL LOW (ref 3.87–5.11)
RDW: 14.6 % (ref 11.5–15.5)
WBC: 5.7 10*3/uL (ref 4.0–10.5)
nRBC: 0 % (ref 0.0–0.2)

## 2022-05-30 MED ORDER — DEXTROMETHORPHAN POLISTIREX ER 30 MG/5ML PO SUER: 15.0000 mg | Freq: Four times a day (QID) | ORAL | Status: DC | PRN

## 2022-05-30 MED ORDER — PROMETHAZINE HCL 6.25 MG/5ML PO SYRP: 6.2500 mg | ORAL_SOLUTION | Freq: Four times a day (QID) | ORAL | Status: DC | PRN

## 2022-05-30 MED ORDER — BOOST PLUS PO LIQD
237.0000 mL | Freq: Three times a day (TID) | ORAL | Status: DC
  Administered 2022-05-31 – 2022-06-03 (×4): 237 mL via ORAL
  Filled 2022-05-30 (×22): qty 237

## 2022-05-30 MED ORDER — BOOST PLUS PO LIQD
237.0000 mL | Freq: Every day | ORAL | Status: DC
  Filled 2022-05-30: qty 237

## 2022-05-30 NOTE — Progress Notes (Signed)
Initial Nutrition Assessment  DOCUMENTATION CODES:   Non-severe (moderate) malnutrition in context of chronic illness  INTERVENTION:   -Boost Plus TID- Each supplement provides 360kcal and 14g protein.     NUTRITION DIAGNOSIS:   Moderate Malnutrition related to chronic illness, cancer and cancer related treatments (COPD) as evidenced by moderate muscle depletion, mild fat depletion.  GOAL:   Patient will meet greater than or equal to 90% of their needs  MONITOR:   PO intake, Supplement acceptance, Labs, Weight trends, I & O's  REASON FOR ASSESSMENT:   Malnutrition Screening Tool    ASSESSMENT:   87 y.o. female with PMH significant for stage IV adenocarcinoma of the lungs, history of left breast cancer, HTN, HLD, COPD on 4-5L oxygen at home, GERD, anxiety, osteoarthritis  11/20, patient presented to the ED from home with complaint of 4 days of worsening shortness of breath, wheezing, weakness, cough, cyanosis.  Patient in room, reports she is eating three times a day at her living facility. Pt typically has a breakfast of 3 english muffins w/ butter and jelly. Lunch is either a Boost supplement or grapes with hard boiled egg. Dinner is provided from [employer], pt really likes their soups. Pt is allergic to cheese but no other dairy products. Pt eating 100% of her meals here. Had a [employer] don[employer] she was going to eat for lunch. Denies swallowing or chewing issues.  Per weight records, pt has lost 28 lbs since November 2022, insignificant for time frame.   Medications: Vitamin D, Vitamin X-83, Folic acid, Multivitamin with minerals daily, KLOR-CON  Labs reviewed.  NUTRITION - FOCUSED PHYSICAL EXAM:  Flowsheet Row Most Recent Value  Orbital Region Mild depletion  Upper Arm Region Mild depletion  Thoracic and Lumbar Region Mild depletion  Buccal Region Mild depletion  Temple Region Moderate depletion  Clavicle Bone Region Moderate depletion  Clavicle and Acromion Bone  Region Moderate depletion  Scapular Bone Region Moderate depletion  Dorsal Hand Moderate depletion  Patellar Region Moderate depletion  Anterior Thigh Region Moderate depletion  Posterior Calf Region Moderate depletion  Edema (RD Assessment) None  Hair Reviewed  Eyes Reviewed  Mouth Reviewed  Skin Reviewed       Diet Order:   Diet Order             Diet Heart Room service appropriate? Yes; Fluid consistency: Thin  Diet effective now                   EDUCATION NEEDS:   No education needs have been identified at this time  Skin:  Skin Assessment: Reviewed RN Assessment  Last BM:  11/21 -type 2  Height:   Ht Readings from Last 1 Encounters:  05/29/22 5\' 5"  (1.651 m)    Weight:   Wt Readings from Last 1 Encounters:  05/29/22 70 kg    BMI:  Body mass index is 25.68 kg/m.  Estimated Nutritional Needs:   Kcal:  2100-2300  Protein:  105-115g  Fluid:  2.1L/day  Clayton Bibles, MS, RD, LDN Inpatient Clinical Dietitian Contact information available via Amion

## 2022-05-30 NOTE — Evaluation (Signed)
Physical Therapy Evaluation Patient Details Name: Sheila Shields MRN: [id number] DOB: 13-Feb-1935 Today's Date: 05/30/2022  History of Present Illness  ean JAKEYA Shields is a 87 y.o. female with PMH significant for stage IV adenocarcinoma of the lungs, history of left breast cancer, HTN, HLD, brain aneurysm,COPD on 4-5L oxygen at home, GERD, anxiety, osteoarthritis  11/20, patient presented to the ED from home with complaint of 4 days of worsening shortness of breath, wheezing, weakness, cough, cyanosis.  EMS noted her O2 sat at 44% on 5 L.  Clinical Impression  Pt admitted with above diagnosis.   Pt currently with functional limitations due to the deficits listed below (see PT Problem List). Pt will benefit from skilled PT to increase their independence and safety with mobility to allow discharge to the venue listed below.       The patient is resting on 8 LHFNC, Spo2 855. Moved to bed edge , initially SPO2 94% then  dropped too 805. Stood and transferred to recliner, standing x ~ 30" before sitting down. SPO2 75%. Encouraged PLB and no talking. SPO2 gradually reached 85 % but desats back to 80% when talking. Patient will benefit from a [facility] aide and transport chair. Patient has HHPT on board.    Recommendations for follow up therapy are one component of a multi-disciplinary discharge planning process, led by the attending physician.  Recommendations may be updated based on patient status, additional functional criteria and insurance authorization.  Follow Up Recommendations Home health PT      Assistance Recommended at Discharge Intermittent Supervision/Assistance  Patient can return home with the following  A little help with bathing/dressing/bathroom;Assist for transportation;Help with stairs or ramp for entrance    Equipment Recommendations  (pt wants to get a transport chair after returns home)  Recommendations for Other Services       Functional Status Assessment Patient has had a recent  decline in their functional status and demonstrates the ability to make significant improvements in function in a reasonable and predictable amount of time.     Precautions / Restrictions Precautions Precaution Comments: desats quickly, on   8 L      Mobility  Bed Mobility Overal bed mobility: Modified Independent                  Transfers Overall transfer level: Needs assistance Equipment used: Rolling walker (2 wheels) Transfers: Sit to/from Stand, Bed to chair/wheelchair/BSC Sit to Stand: Supervision   Step pivot transfers: Supervision       General transfer comment: very limited with SOB on 8 l, stepped in place x 5 and had to sit down    Ambulation/Gait                  Stairs            Wheelchair Mobility    Modified Rankin (Stroke Patients Only)       Balance Overall balance assessment: Mild deficits observed, not formally tested                                           Pertinent Vitals/Pain Pain Assessment Pain Assessment: No/denies pain    Home Living Family/patient expects to be discharged to:: Private residence Living Arrangements: Spouse/significant other Available Help at Discharge: Available 24 hours/day;Family Type of Home: Independent living facility (townhome) Home Access: Level entry  Home Layout: One level Home Equipment: Rollator (4 wheels);Cane - single point Additional Comments: Pt lives in [place] [facility]    Prior Function Prior Level of Function : Independent/Modified Independent             Mobility Comments: baseline 4 L, limited activity due to SOB ADLs Comments: IADL's needs assistance now at DC     [employer]        Extremity/Trunk Assessment   Upper Extremity Assessment Upper Extremity Assessment: Overall WFL for tasks assessed    Lower Extremity Assessment Lower Extremity Assessment: Generalized weakness    Cervical / Trunk Assessment Cervical /  Trunk Assessment: Kyphotic  Communication   Communication: No difficulties  Cognition Arousal/Alertness: Awake/alert Behavior During Therapy: WFL for tasks assessed/performed Overall Cognitive Status: Within Functional Limits for tasks assessed                                          General Comments      Exercises     Assessment/Plan    PT Assessment Patient needs continued PT services  PT Problem List Decreased strength;Decreased activity tolerance;Decreased mobility;Cardiopulmonary status limiting activity       PT Treatment Interventions DME instruction;Therapeutic activities;Gait training;Functional mobility training;Therapeutic exercise;Patient/family education    PT Goals (Current goals can be found in the Care Plan section)  Acute Rehab PT Goals Patient Stated Goal: to breathe , go home PT Goal Formulation: With patient/family Time For Goal Achievement: 06/13/22 Potential to Achieve Goals: Fair    Frequency Min 3X/week     Co-evaluation               AM-PAC PT "6 Clicks" Mobility  Outcome Measure Help needed turning from your back to your side while in a flat bed without using bedrails?: None Help needed moving from lying on your back to sitting on the side of a flat bed without using bedrails?: None Help needed moving to and from a bed to a chair (including a wheelchair)?: A Little Help needed standing up from a chair using your arms (e.g., wheelchair or bedside chair)?: A Little Help needed to walk in hospital room?: Total Help needed climbing 3-5 steps with a railing? : Total 6 Click Score: 16    End of Session Equipment Utilized During Treatment: Oxygen Activity Tolerance: Treatment limited secondary to medical complications (Comment) Patient left: in chair;with call bell/phone within reach;with family/visitor present Nurse Communication: Mobility status PT Visit Diagnosis: Difficulty in walking, not elsewhere classified  (R26.2)    Time: 1600-1630 PT Time Calculation (min) (ACUTE ONLY): 30 min   Charges:   PT Evaluation $PT Eval Low Complexity: 1 Low PT Treatments $Therapeutic Activity: 8-22 mins        ,sign  Claretha Cooper 05/30/2022, 4:44 PM

## 2022-05-30 NOTE — TOC Initial Note (Addendum)
Transition of Care [facility]) - Initial/Assessment Note    Patient Details  Name: Sheila Shields MRN: [id number] Date of Birth: [birth year]  Transition of Care [facility]) CM/SW Contact:    Dessa Phi, RN Phone Number: 05/30/2022, 10:49 AM  Clinical Narrative: Patient states she lives in her own home;has all dme;has home 02, & travel tank. Has home PT through [facility]).Fax HHPT orders to [facility] fax#[phone].Has own transport home.No further CM needs. -2:20p-Active w/[facility] home 02-rep Erasmo Downer following if higher requirement needed await orders, & sats within 48hrs of d/c.                  Expected Discharge Plan: [place] Barriers to Discharge: No Barriers Identified   Patient Goals and CMS Choice Patient states their goals for this hospitalization and ongoing recovery are::  (Home) CMS Medicare.gov Compare Post Acute Care list provided to:: Patient Choice offered to / list presented to : Patient  Expected Discharge Plan and Services Expected Discharge Plan: [place]   Discharge Planning Services: CM Consult Post Acute Care Choice: Durable Medical Equipment, Home Health (Active home 02,rw,cane;HHPT-through [facility]) Living arrangements for the past 2 months: Single Family Home                                      Prior Living Arrangements/Services Living arrangements for the past 2 months: Single Family Home Lives with:: Spouse Patient language and need for interpreter reviewed:: Yes Do you feel safe going back to the place where you live?: Yes      Need for Family Participation in Patient Care: Yes (Comment) Care giver support system in place?: Yes (comment)   Criminal Activity/Legal Involvement Pertinent to Current Situation/Hospitalization: No - Comment as needed  Activities of Daily Living Home Assistive Devices/Equipment: Eyeglasses, Gilford Rile (specify type) (built in bench) ADL Screening (condition  at time of admission) Patient's cognitive ability adequate to safely complete daily activities?: Yes Is the patient deaf or have difficulty hearing?: No Does the patient have difficulty seeing, even when wearing glasses/contacts?: No Does the patient have difficulty concentrating, remembering, or making decisions?: No Patient able to express need for assistance with ADLs?: Yes Does the patient have difficulty dressing or bathing?: No Independently performs ADLs?: No Communication: Independent Dressing (OT): Independent Grooming: Independent Feeding: Independent Bathing: Independent Toileting: Needs assistance Is this a change from baseline?: Pre-admission baseline In/Out Bed: Needs assistance Is this a change from baseline?: Pre-admission baseline Walks in Home: Needs assistance Is this a change from baseline?: Pre-admission baseline Does the patient have difficulty walking or climbing stairs?: Yes Weakness of Legs: Both Weakness of Arms/Hands: Both  Permission Sought/Granted Permission sought to share information with : Case Manager Permission granted to share information with : Yes, Verbal Permission Granted  Share Information with NAME: Case Manager           Emotional Assessment Appearance:: Appears stated age Attitude/Demeanor/Rapport: Gracious Affect (typically observed): Accepting Orientation: : Oriented to Self, Oriented to Place, Oriented to  Time, Oriented to Situation Alcohol / Substance Use: Not Applicable Psych Involvement: No (comment)  Admission diagnosis:  COPD exacerbation ([place]) [J44.1] Acute on chronic respiratory failure with hypoxia (HCC) [J96.21] Acute on chronic hypoxic respiratory failure ([place]) [J96.21] Patient Active Problem List   Diagnosis Date Noted   Acute on chronic hypoxic respiratory failure ([place]) 05/29/2022   Pneumonia 04/16/2022  Acute on chronic respiratory failure with hypoxia (HCC) 04/15/2022   Acquired hammer toe of right foot  04/15/2022   Acquired hammer toe of left foot 04/15/2022   Peripheral vascular disease ([place]) 04/15/2022   Microcytic anemia 04/15/2022   Left lower lobe pulmonary infiltrate 04/15/2022   COPD with acute exacerbation ([place]) 04/15/2022   Encounter for antineoplastic chemotherapy 01/18/2022   Adenocarcinoma of right lung, stage 4 ([place]) 12/21/2021   Encounter for antineoplastic immunotherapy 12/21/2021   Pulmonary nodules/lesions, multiple 11/15/2021   DOE (dyspnea on exertion) 10/28/2021   Chronic respiratory failure ([place]) 10/28/2021   Tenosynovitis of fingers 08/09/2020   Insomnia 05/24/2020   Mild aortic stenosis 05/24/2020   Malignant neoplasm of upper-outer quadrant of left breast in female, estrogen receptor positive ([place]) 02/12/2020   Former smoker 05/22/2019   Brain aneurysm 12/30/2018   Aortic atherosclerosis ([place]) 04/11/2018   Osteoarthritis of ankle and foot 11/13/2017   Allergic rhinitis 11/09/2017   Arm mass, left 04/16/2017   Chronic cough 10/11/2015   COPD (chronic obstructive pulmonary disease) ([place]) 11/18/2014   Iron deficiency anemia, unspecified 01/20/2011   Arthropathy 02/23/2010   Hyperglycemia 08/27/2007   Alcoholism in remission ([place]) 08/27/2007   Gastroesophageal reflux disease 05/01/2007   Hyperlipemia 12/17/2006   Essential hypertension 12/17/2006   PCP:  Marin Olp, MD Pharmacy:   [facility] [place] [place] [address] [facility] [address] [place] [address] Phone: [phone] Fax: [phone]     Social Determinants of Health (SDOH) Interventions    Readmission Risk Interventions    04/18/2022   10:49 AM  Readmission Risk Prevention Plan  Transportation Screening Complete  PCP or Specialist Appt within 3-5 Days Complete  HRI or [place] Complete  Social Work Consult for [place] Planning/Counseling Complete  Palliative Care Screening Complete  Medication  Review Press photographer) Complete

## 2022-05-30 NOTE — Progress Notes (Signed)
PROGRESS NOTE  Sheila Shields  DOB: Oct 29, 1934  PCP: Marin Olp, MD YPP:[id number]  DOA: 05/29/2022  LOS: 1 day  Hospital Day: 2  Brief narrative: Sheila Shields is a 87 y.o. female with PMH significant for stage IV adenocarcinoma of the lungs, history of left breast cancer, HTN, HLD, COPD on 4-5L oxygen at home, GERD, anxiety, osteoarthritis 11/20, patient presented to the ED from home with complaint of 4 days of worsening shortness of breath, wheezing, weakness, cough, cyanosis. EMS noted her O2 sat at 44% on 5 L.  Give her albuterol, Atrovent, IV Solu-Medrol.  In the ED, patient was afebrile, heart rate 113, blood pressure 91/57, respiratory in 20s and low 30s, required nonrebreather mask in the ED. Blood gas with pH 7.45, PCO2 44, bicarb 31, BNP 500, WBC count 7.8, hemoglobin 8.7 Respiratory virus panel negative for flu, COVID CT angio chest did not show any evidence of acute pulm embolism.  Showed severe chronic lung disease with diffuse interstitial thickening, patchy airspace opacities which have progressed compared to prior CT scan from August. Patient was given bronchodilators, IV Solu-Medrol, empiric IV antibiotics Admitted to hospitalist service  Subjective: Patient was seen and examined this morning.  Pleasant elderly Caucasian female. Lying down on bed.  On 6 L oxygen by nasal cannula with O2 sat 88 to 90%.  However patient is not symptomatic with that. No family at bedside.  Assessment and plan: Acute on chronic respiratory failure with hypoxemia Acute exacerbation of COPD Patient was on 4 to 5 L of oxygen at home in the setting of severe COPD and stage IV adenocarcinoma of lung. Presented with worsening shortness of breath, required nonrebreather mask in the ED. Imaging with severe chronic lung disease and diffuse interstitial thickening, patchy airspace opacities.  Unclear if there is any acute component. Patient is currently on empiric antibiotic coverage with  IV Rocephin, IV azithromycin along with Solu-Medrol 40 mg IV twice daily, bronchodilators, Tessalon Perles, Mucinex Continue to monitor respiratory status.  Currently requiring 6 L oxygen by nasal cannula.  I would target her O2 sat close to 85% as she seems fairly comfortable with a low O2 saturation target.   Stage IV lung cancer Continue follow-up with Dr. Julien Nordmann   Essential hypertension I do not see any antihypertensive in her home med list.   Continue to monitor blood pressure  HLD Continue aspirin, Plavix, Crestor   GERD Continue PPI   H/O breast cancer Continue tamoxifen.    Chronic macrocytic anemia Hemoglobin stable between 8 and 9. Continue vitamin B12 and folate supplement. Recent Labs    04/27/22 1055 05/03/22 1029 05/24/22 1036 05/29/22 1220 05/30/22 0503  HGB 8.8 Repeated and verified X2.* 9.2* 9.7* 8.7* 8.6*  MCV 106.4* 109.2* 110.7* 111.4* 110.9*   Goals of care   Code Status: Full Code    Mobility: Encourage ambulation.  PT eval ordered  Skin assessment:     Nutritional status:  Body mass index is 25.68 kg/m.          Diet:  Diet Order             Diet Heart Room service appropriate? Yes; Fluid consistency: Thin  Diet effective now                   DVT prophylaxis:  enoxaparin (LOVENOX) injection 40 mg Start: 05/29/22 2200   Antimicrobials: IV Rocephin, azithromycin Fluid: None Consultants: None  Family Communication: None at bedside  Status is: Inpatient  Continue in-hospital care because: Remains on high flow oxygen.  Wean down as tolerated Level of care: Progressive   Dispo: The patient is from: Home              Anticipated d/c is to: Pending clinical course              Patient currently is not medically stable to d/c.   Difficult to place patient No     Infusions:   azithromycin 500 mg (05/29/22 2323)   cefTRIAXone (ROCEPHIN)  IV 1 g (05/29/22 2138)    Scheduled Meds:  acetaminophen  1,000 mg Oral Q6H    arformoterol  15 mcg Nebulization BID   And   umeclidinium bromide  1 puff Inhalation Daily   aspirin  325 mg Oral Daily   budesonide  0.5 mg Nebulization BID   cholecalciferol  2,000 Units Oral Daily   clopidogrel  37.5 mg Oral Daily   cyanocobalamin  1,000 mcg Oral Daily   enoxaparin (LOVENOX) injection  40 mg Subcutaneous A21H   folic acid  1 mg Oral Daily   guaiFENesin  600 mg Oral Daily   lactose free nutrition  237 mL Oral Daily   loratadine  10 mg Oral Daily   methylPREDNISolone (SOLU-MEDROL) injection  40 mg Intravenous Q12H   montelukast  10 mg Oral QHS   multivitamin with minerals  1 tablet Oral Q breakfast   pantoprazole  40 mg Oral Daily   potassium chloride SA  20 mEq Oral QODAY   rosuvastatin  10 mg Oral Daily   tamoxifen  20 mg Oral Daily   traZODone  50 mg Oral QHS    PRN meds: albuterol, benzonatate, promethazine **AND** dextromethorphan, morphine injection, ondansetron **OR** ondansetron (ZOFRAN) IV, traMADol   Antimicrobials: Anti-infectives (From admission, onward)    Start     Dose/Rate Route Frequency Ordered Stop   05/29/22 2200  cefTRIAXone (ROCEPHIN) 1 g in sodium chloride 0.9 % 100 mL IVPB        1 g 200 mL/hr over 30 Minutes Intravenous Every 24 hours 05/29/22 2020     05/29/22 2200  azithromycin (ZITHROMAX) 500 mg in sodium chloride 0.9 % 250 mL IVPB        500 mg 250 mL/hr over 60 Minutes Intravenous Every 24 hours 05/29/22 2020         Objective: Vitals:   05/30/22 0424 05/30/22 0916  BP: 121/75   Pulse: 93   Resp: 20   Temp: 97.8 F (36.6 C)   SpO2: (!) 86% (!) 85%    Intake/Output Summary (Last 24 hours) at 05/30/2022 1315 Last data filed at 05/30/2022 0900 Gross per 24 hour  Intake 1090 ml  Output --  Net 1090 ml   Filed Weights   05/29/22 1207 05/29/22 2013  Weight: 70.3 kg 70 kg   Weight change:  Body mass index is 25.68 kg/m.   Physical Exam: General exam: Pleasant, elderly Caucasian female.  On mild to moderate  respite distress Skin: No rashes, lesions or ulcers. HEENT: Atraumatic, normocephalic, no obvious bleeding Lungs: Mild scattered wheezing bilaterally, coughs on deep breathing CVS: Regular rate and rhythm, no murmur GI/Abd soft, nontender, nondistended, bowel sound present CNS: Alert, awake, oriented x3 Psychiatry: Mood appropriate Extremities: No pedal edema, no calf tenderness  Data Review: I have personally reviewed the laboratory data and studies available.  F/u labs ordered [employer] (From admission, onward)     Start     Ordered  06/05/22 0500  Creatinine, serum  (enoxaparin (LOVENOX)    CrCl >/= 30 ml/min)  Weekly,   R     Comments: while on enoxaparin therapy    05/29/22 2020   05/31/22 0500  CBC with Differential/Platelet  Tomorrow morning,   R       Question:  Specimen collection method  Answer:  Lab=Lab collect   05/30/22 1315            Signed, Terrilee Croak, MD [facility] 05/30/2022

## 2022-05-31 ENCOUNTER — Other Ambulatory Visit: Payer: Medicare HMO

## 2022-05-31 ENCOUNTER — Inpatient Hospital Stay (HOSPITAL_COMMUNITY): Payer: Medicare HMO

## 2022-05-31 DIAGNOSIS — J441 Chronic obstructive pulmonary disease with (acute) exacerbation: Secondary | ICD-10-CM

## 2022-05-31 DIAGNOSIS — J9621 Acute and chronic respiratory failure with hypoxia: Secondary | ICD-10-CM | POA: Diagnosis not present

## 2022-05-31 DIAGNOSIS — K219 Gastro-esophageal reflux disease without esophagitis: Secondary | ICD-10-CM | POA: Diagnosis not present

## 2022-05-31 DIAGNOSIS — C3491 Malignant neoplasm of unspecified part of right bronchus or lung: Secondary | ICD-10-CM

## 2022-05-31 DIAGNOSIS — I272 Pulmonary hypertension, unspecified: Secondary | ICD-10-CM

## 2022-05-31 DIAGNOSIS — E440 Moderate protein-calorie malnutrition: Secondary | ICD-10-CM | POA: Insufficient documentation

## 2022-05-31 DIAGNOSIS — I10 Essential (primary) hypertension: Secondary | ICD-10-CM

## 2022-05-31 DIAGNOSIS — E782 Mixed hyperlipidemia: Secondary | ICD-10-CM

## 2022-05-31 LAB — RESPIRATORY PANEL BY PCR

## 2022-05-31 LAB — ECHOCARDIOGRAM COMPLETE
AR max vel: 1.88 cm2
AV Area VTI: 2.1 cm2
AV Area mean vel: 1.74 cm2
AV Mean grad: 14 mmHg
AV Peak grad: 24.4 mmHg
Ao pk vel: 2.47 m/s
Area-P 1/2: 3.48 cm2
Calc EF: 62.6 %
Height: 65 in
S' Lateral: 2.6 cm
Single Plane A2C EF: 65.2 %
Single Plane A4C EF: 59.8 %
Weight: 2532.64 oz

## 2022-05-31 LAB — CBC WITH DIFFERENTIAL/PLATELET
Abs Immature Granulocytes: 0.05 10*3/uL (ref 0.00–0.07)
Basophils Absolute: 0 10*3/uL (ref 0.0–0.1)
Basophils Relative: 0 %
Eosinophils Absolute: 0 10*3/uL (ref 0.0–0.5)
Eosinophils Relative: 0 %
HCT: 24.6 % — ABNORMAL LOW (ref 36.0–46.0)
Hemoglobin: 7.7 g/dL — ABNORMAL LOW (ref 12.0–15.0)
Immature Granulocytes: 1 %
Lymphocytes Relative: 2 %
Lymphs Abs: 0.1 10*3/uL — ABNORMAL LOW (ref 0.7–4.0)
MCH: 35.2 pg — ABNORMAL HIGH (ref 26.0–34.0)
MCHC: 31.3 g/dL (ref 30.0–36.0)
MCV: 112.3 fL — ABNORMAL HIGH (ref 80.0–100.0)
Monocytes Absolute: 0.2 10*3/uL (ref 0.1–1.0)
Monocytes Relative: 3 %
Neutro Abs: 5.1 10*3/uL (ref 1.7–7.7)
Neutrophils Relative %: 94 %
Platelets: 125 10*3/uL — ABNORMAL LOW (ref 150–400)
RBC: 2.19 MIL/uL — ABNORMAL LOW (ref 3.87–5.11)
RDW: 14.9 % (ref 11.5–15.5)
WBC: 5.4 10*3/uL (ref 4.0–10.5)
nRBC: 0 % (ref 0.0–0.2)

## 2022-05-31 LAB — STREP PNEUMONIAE URINARY ANTIGEN: Strep Pneumo Urinary Antigen: NEGATIVE

## 2022-05-31 LAB — PROCALCITONIN: Procalcitonin: 0.1 ng/mL

## 2022-05-31 LAB — BRAIN NATRIURETIC PEPTIDE: B Natriuretic Peptide: 637.6 pg/mL — ABNORMAL HIGH (ref 0.0–100.0)

## 2022-05-31 MED ORDER — FUROSEMIDE 10 MG/ML IJ SOLN
40.0000 mg | Freq: Once | INTRAMUSCULAR | Status: AC
  Administered 2022-06-01: 40 mg via INTRAVENOUS
  Filled 2022-05-31: qty 4

## 2022-05-31 MED ORDER — GUAIFENESIN ER 600 MG PO TB12
1200.0000 mg | ORAL_TABLET | Freq: Every day | ORAL | Status: DC
  Administered 2022-06-01 – 2022-06-07 (×7): 1200 mg via ORAL
  Filled 2022-05-31 (×8): qty 2

## 2022-05-31 MED ORDER — SODIUM CHLORIDE 0.9 % IV SOLN
2.0000 g | INTRAVENOUS | Status: DC
  Administered 2022-05-31: 2 g via INTRAVENOUS
  Filled 2022-05-31: qty 20

## 2022-05-31 MED ORDER — FUROSEMIDE 10 MG/ML IJ SOLN
20.0000 mg | Freq: Once | INTRAMUSCULAR | Status: DC
  Filled 2022-05-31: qty 2

## 2022-05-31 MED ORDER — FUROSEMIDE 10 MG/ML IJ SOLN
40.0000 mg | Freq: Once | INTRAMUSCULAR | Status: AC
  Administered 2022-05-31: 40 mg via INTRAVENOUS
  Filled 2022-05-31: qty 4

## 2022-05-31 NOTE — Progress Notes (Signed)
PROGRESS NOTE    Sheila Shields  VQM:[id number] DOB: 1935/01/29 DOA: 05/29/2022 PCP: Marin Olp, MD    Chief Complaint  Patient presents with   Shortness of Breath    Brief Narrative:  Sheila Shields is a 87 y.o. female with PMH significant for stage IV adenocarcinoma of the lungs, history of left breast cancer, HTN, HLD, COPD on 4-5L oxygen at home, GERD, anxiety, osteoarthritis 11/20, patient presented to the ED from home with complaint of 4 days of worsening shortness of breath, wheezing, weakness, cough, cyanosis. EMS noted her O2 sat at 44% on 5 L.  Give her albuterol, Atrovent, IV Solu-Medrol.   In the ED, patient was afebrile, heart rate 113, blood pressure 91/57, respiratory in 20s and low 30s, required nonrebreather mask in the ED. Blood gas with pH 7.45, PCO2 44, bicarb 31, BNP 500, WBC count 7.8, hemoglobin 8.7 Respiratory virus panel negative for flu, COVID CT angio chest did not show any evidence of acute pulm embolism.  Showed severe chronic lung disease with diffuse interstitial thickening, patchy airspace opacities which have progressed compared to prior CT scan from August. Patient was given bronchodilators, IV Solu-Medrol, empiric IV antibiotics Admitted to hospitalist service   Assessment & Plan:   Principal Problem:   Acute on chronic respiratory failure with hypoxia ([place]) Active Problems:   Hyperlipemia   Essential hypertension   Gastroesophageal reflux disease   Adenocarcinoma of right lung, stage 4 (HCC)   COPD with acute exacerbation (HCC)   Acute on chronic hypoxic respiratory failure (HCC)   Malnutrition of moderate degree  #1 acute on chronic respiratory failure with hypoxemia/acute exacerbation of COPD -Patient noted chronically on home O2 4 to 5 L oxygen in the setting of severe COPD and stage IV adenocarcinoma of the lung. -Patient presented to the ED with worsening shortness of breath requiring nonrebreather in the ED, imaging showed severe  chronic lung disease diffuse interstitial thickening, patchy airspace opacities unclear of acute component. -CT chest done negative for PE however showed severe chronic lung disease with diffuse interstitial thickening, patchy airspace opacities which are progressed compared to prior CT scan from August. -Patient with worsening O2 requirements currently on 11 L high flow nasal cannula. -Check a urine Legionella antigen, urine pneumococcus antigen. -Continue empiric IV Rocephin, IV azithromycin, IV Solu-Medrol twice daily, bronchodilators, Tessalon Perles, Mucinex. -Per Dr. Pietro Cassis goal O2 will be close to 85% as patient seems fairly comfortable with low O2 saturations. -Patient noted however with worsening progressive shortness of breath on minimal exertion. -VBG on admission with a pH of 7.45, pO2 of 53, bicarb of 30.6, O2 sats of 85.1%. -BNP noted elevated at 500.5 on admission. -Will repeat BNP today. -Lasix 40 mg IV x 1, strict I's and O's, daily weights. -Due to worsening pulmonary status and increased O2 requirements will consult with PCCM for further evaluation and management.  2.  Stage IV lung cancer -Being followed by Dr. Curt Bears in the outpatient setting. -Will inform patient's primary oncologist via epic of admission. -Outpatient follow-up.  3.  Hyperlipidemia -Continue statin.  4.  Hypertension -Patient noted not on antihypertensive medications prior to admission. -BP stable. -Monitor BP with diuresis.  5.  GERD -PPI.  6.  History of breast cancer -Tamoxifen.  7.  Chronic macrocytic anemia -Hemoglobin currently stable at 7.7. -Continue vitamin D32, folic acid supplementation. -Transfusion threshold hemoglobin , 7   DVT prophylaxis: Lovenox Code Status: Full Family Communication: Updated patient, daughter at bedside.  Updated  husband who was on speaker phone at bedside. Disposition: TBD  Status is: Inpatient Remains inpatient appropriate because: Severity  of illness   Consultants:  PCCM: Dr. Loanne Drilling 05/31/2022  Procedures:  CT angiogram chest 05/29/2022 Chest x-ray 05/29/2022 2D echo 05/31/2022   Antimicrobials:  IV azithromycin 05/29/2022>>>> IV Rocephin 05/29/2022>>>>>   Subjective: Sitting up in bed on the telephone with her husband, daughter at bedside.  States her breathing feels okay however feels significantly short of breath on minimal exertion.  States symptoms started 3 to 4 days after she received Keytruda and had immunotherapy and states had similar presentation the last time she was hospitalized which she just realized.  Objective: Vitals:   05/31/22 0025 05/31/22 0513 05/31/22 0807 05/31/22 0830  BP: 125/70 (!) 156/73    Pulse: 88 79    Resp: 20 20    Temp: (!) 97.3 F (36.3 C) 97.7 F (36.5 C)    TempSrc: Oral Oral    SpO2: 91% 95% (!) 88%   Weight:    71.8 kg  Height:        Intake/Output Summary (Last 24 hours) at 05/31/2022 1045 Last data filed at 05/31/2022 3710 Gross per 24 hour  Intake 660.63 ml  Output 350 ml  Net 310.63 ml   Filed Weights   05/29/22 1207 05/29/22 2013 05/31/22 0830  Weight: 70.3 kg 70 kg 71.8 kg    Examination:  General exam: Appears calm and comfortable  Respiratory system: Decreased breath sounds in the bases.  Fine crackles noted R < l..Minimal wheezing.  Fair air movement.  Speaking in full sentences.  On 11 L high flow nasal cannula.   Cardiovascular system: S1 & S2 heard, RRR. No JVD, murmurs, rubs, gallops or clicks. No pedal edema. Gastrointestinal system: Abdomen is nondistended, soft and nontender. No organomegaly or masses felt. Normal bowel sounds heard. Central nervous system: Alert and oriented. No focal neurological deficits. Extremities: Symmetric 5 x 5 power. Skin: No rashes, lesions or ulcers Psychiatry: Judgement and insight appear normal. Mood & affect appropriate.     Data Reviewed: I have personally reviewed following labs and imaging  studies  CBC: Recent Labs  Lab 05/29/22 1220 05/30/22 0503 05/31/22 0502  WBC 7.8 5.7 5.4  NEUTROABS  --   --  5.1  HGB 8.7* 8.6* 7.7*  HCT 27.3* 27.4* 24.6*  MCV 111.4* 110.9* 112.3*  PLT 206 169 125*    Basic Metabolic Panel: Recent Labs  Lab 05/29/22 1220 05/30/22 0503  NA 139 140  K 3.7 4.4  CL 102 106  CO2 27 27  GLUCOSE 115* 130*  BUN 16 17  CREATININE 0.71 0.58  CALCIUM 8.6* 9.1    GFR: Estimated Creatinine Clearance: 51.1 mL/min (by C-G formula based on SCr of 0.58 mg/dL).  Liver Function Tests: Recent Labs  Lab 05/30/22 0503  AST 23  ALT 9  ALKPHOS 40  BILITOT 0.4  PROT 7.0  ALBUMIN 2.4*    CBG: No results for input(s): "GLUCAP" in the last 168 hours.   Recent Results (from the past 240 hour(s))  Resp Panel by RT-PCR (Flu A&B, Covid) Anterior Nasal Swab     Status: None   Collection Time: 05/29/22 12:28 PM   Specimen: Anterior Nasal Swab  Result Value Ref Range Status   SARS Coronavirus 2 by RT PCR NEGATIVE NEGATIVE Final    Comment: (NOTE) SARS-CoV-2 target nucleic acids are NOT DETECTED.  The SARS-CoV-2 RNA is generally detectable in upper respiratory specimens during the  acute phase of infection. The lowest concentration of SARS-CoV-2 viral copies this assay can detect is 138 copies/mL. A negative result does not preclude SARS-Cov-2 infection and should not be used as the sole basis for treatment or other patient management decisions. A negative result may occur with  improper specimen collection/handling, submission of specimen other than nasopharyngeal swab, presence of viral mutation(s) within the areas targeted by this assay, and inadequate number of viral copies(<138 copies/mL). A negative result must be combined with clinical observations, patient history, and epidemiological information. The expected result is Negative.  Fact Sheet for Patients:  EntrepreneurPulse.com.au  Fact Sheet for Healthcare  Providers:  IncredibleEmployment.be  This test is no t yet approved or cleared by the [place] FDA and  has been authorized for detection and/or diagnosis of SARS-CoV-2 by FDA under an Emergency Use Authorization (EUA). This EUA will remain  in effect (meaning this test can be used) for the duration of the COVID-19 declaration under Section 564(b)(1) of the Act, 21 U.S.C.section 360bbb-3(b)(1), unless the authorization is terminated  or revoked sooner.       Influenza A by PCR NEGATIVE NEGATIVE Final   Influenza B by PCR NEGATIVE NEGATIVE Final    Comment: (NOTE) The Xpert Xpress SARS-CoV-2/FLU/RSV plus assay is intended as an aid in the diagnosis of influenza from Nasopharyngeal swab specimens and should not be used as a sole basis for treatment. Nasal washings and aspirates are unacceptable for Xpert Xpress SARS-CoV-2/FLU/RSV testing.  Fact Sheet for Patients: EntrepreneurPulse.com.au  Fact Sheet for Healthcare Providers: IncredibleEmployment.be  This test is not yet approved or cleared by the [place] FDA and has been authorized for detection and/or diagnosis of SARS-CoV-2 by FDA under an Emergency Use Authorization (EUA). This EUA will remain in effect (meaning this test can be used) for the duration of the COVID-19 declaration under Section 564(b)(1) of the Act, 21 U.S.C. section 360bbb-3(b)(1), unless the authorization is terminated or revoked.  Performed at [facility], [place] [address] [place], [place] [address]          Radiology Studies: CT Angio Chest PE W and/or Wo Contrast  Result Date: 05/29/2022 CLINICAL DATA:  Cough for 4 days. History of respiratory failure, COPD and lung cancer. Increasing weakness. EXAM: CT ANGIOGRAPHY CHEST WITH CONTRAST TECHNIQUE: Multidetector CT imaging of the chest was performed using the standard protocol during bolus administration of  intravenous contrast. Multiplanar CT image reconstructions and MIPs were obtained to evaluate the vascular anatomy. RADIATION DOSE REDUCTION: This exam was performed according to the departmental dose-optimization program which includes automated exposure control, adjustment of the mA and/or kV according to patient size and/or use of iterative reconstruction technique. CONTRAST:  76mL OMNIPAQUE IOHEXOL 350 MG/ML SOLN COMPARISON:  Chest CT 04/17/2022 and 02/27/2022. FINDINGS: Cardiovascular: Study is mildly degraded by breathing artifact. The pulmonary arteries are well opacified with contrast to the level of the subsegmental branches. There is no evidence of acute pulmonary embolism. There is limited enhancement of the systemic vessels which demonstrate extensive atherosclerosis. No acute systemic arterial abnormalities are identified. The heart size is normal. There is no pericardial effusion. Mediastinum/Nodes: There are no enlarged mediastinal, hilar or axillary lymph nodes.Unchanged appearance of partially calcified right thyroid nodule. Although previously hypermetabolic on PET-CT, given comorbidities, additional evaluation not likely warranted. Lungs/Pleura: Small left pleural effusion. Severe chronic lung disease with centrilobular and paraseptal emphysema, diffuse interstitial thickening and patchy airspace opacities which have progressed compared with the prior CT. Upper abdomen: The visualized  upper abdomen appears stable without significant findings. Previously characterized hepatic cysts are grossly unchanged. Musculoskeletal/Chest wall: No acute osseous findings. Sclerotic metastasis within the T3 vertebral body and multilevel thoracolumbar spondylosis again noted. Review of the MIP images confirms the above findings. IMPRESSION: 1. No evidence of acute pulmonary embolism or other acute vascular findings in the chest. 2. Severe chronic lung disease with diffuse interstitial thickening and patchy  airspace opacities which have progressed compared with the prior CT. Findings may reflect superimposed edema or atypical infection. Stable small left pleural effusion. 3. Aortic Atherosclerosis (ICD10-I70.0) and Emphysema (ICD10-J43.9). Electronically Signed   By: Richardean Sale M.D.   On: 05/29/2022 18:20   DG Chest Portable 1 View  Result Date: 05/29/2022 CLINICAL DATA:  Shortness of breath. EXAM: PORTABLE CHEST 1 VIEW COMPARISON:  04/15/2022 FINDINGS: Low lung volumes. The diffuse peripherally predominant interstitial disease is similar in the interval. Slight improvement in aeration at the left base since prior study. The cardiopericardial silhouette is within normal limits for size. The visualized bony structures of the thorax are unremarkable. Telemetry leads overlie the chest. IMPRESSION: Similar appearance of the diffuse peripherally predominant interstitial disease with slight improvement in aeration at the left base. Electronically Signed   By: Misty Stanley M.D.   On: 05/29/2022 12:52        Scheduled Meds:  acetaminophen  1,000 mg Oral Q6H   arformoterol  15 mcg Nebulization BID   And   umeclidinium bromide  1 puff Inhalation Daily   aspirin  325 mg Oral Daily   budesonide  0.5 mg Nebulization BID   cholecalciferol  2,000 Units Oral Daily   clopidogrel  37.5 mg Oral Daily   cyanocobalamin  1,000 mcg Oral Daily   enoxaparin (LOVENOX) injection  40 mg Subcutaneous B26O   folic acid  1 mg Oral Daily   [START ON 06/01/2022] guaiFENesin  1,200 mg Oral Daily   lactose free nutrition  237 mL Oral TID WC   loratadine  10 mg Oral Daily   methylPREDNISolone (SOLU-MEDROL) injection  40 mg Intravenous Q12H   montelukast  10 mg Oral QHS   multivitamin with minerals  1 tablet Oral Q breakfast   pantoprazole  40 mg Oral Daily   potassium chloride SA  20 mEq Oral QODAY   rosuvastatin  10 mg Oral Daily   tamoxifen  20 mg Oral Daily   traZODone  50 mg Oral QHS   Continuous Infusions:   azithromycin 500 mg (05/30/22 2326)   cefTRIAXone (ROCEPHIN)  IV       LOS: 2 days    Time spent: 40 minutes    Irine Seal, MD [facility]   To contact the attending provider between 7A-7P or the covering provider during after hours 7P-7A, please log into the web site [email or web address] and access using universal [place] password for that web site. If you do not have the password, please call the hospital operator.  05/31/2022, 10:45 AM

## 2022-05-31 NOTE — Progress Notes (Signed)
  Echocardiogram 2D Echocardiogram has been performed.  Sheila Shields 05/31/2022, 2:28 PM

## 2022-05-31 NOTE — Consult Note (Signed)
NAME:  TYSHEA IMEL, MRN:  [id number], DOB:  07/08/1935, LOS: 2 ADMISSION DATE:  05/29/2022, CONSULTATION DATE:  05/31/2022 REFERRING MD:  Dr. Grandville Silos, CHIEF COMPLAINT: Hypoxia.    History of Present Illness:  87 year old female with PMH COPD (follows Dr. 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 Lisbeth Renshaw with Radiation Oncology). Completed Palliative radiation to right pelvis and thoracic spine 7/24.   11/20 patient presents to ER with reported progressive shortness of breath, cough, wheezing for last 4 days. On arrival oxygen saturation 44% on 5L [place]. CT PE negative for PE, noted pulmonary edema and severe chronic lung disease with diffuse interstitial thickening and patchy airspace opacities which have progressed from previous. Started on Rocephin/Azithromycin. Requiring 8-15L nasal cannula. Pulmonary consulted.   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   Objective   Blood pressure (!) 156/73, pulse 79, temperature 97.7 F (36.5 C), temperature source Oral, resp. rate 20, height 5\' 5"  (1.651 m), weight 71.8 kg, SpO2 (!) 88 %.        Intake/Output Summary (Last 24 hours) at 05/31/2022 0913 Last data filed at 05/31/2022 4920 Gross per 24 hour  Intake 660.63 ml  Output 350 ml  Net 310.63 ml   Filed Weights   05/29/22 1207  05/29/22 2013 05/31/22 0830  Weight: 70.3 kg 70 kg 71.8 kg    Examination: General: Elderly female, lying in bed, no distress  HENT: MMM Lungs: Coarse breath sounds, exp wheeze, no use of accessory muscles  Cardiovascular: RRR, no mRG Abdomen: soft, non-tender, active bowel sounds  Extremities: -edema  Neuro: alert, oriented, follows commands  GU: intact   Resolved Hospital Problem list     Assessment & Plan:   Acute on Chronic Hypoxic Respiratory Failure, multifactorial with suspected pneumonitis, pulmonary edema, ?pulmonary HTN -COVID, Flu negative  -BNP 500 -CT PE Study > negative PE. Severe chronic lung disease with diffuse interstitial thickening and patchy airspace opacities progressed  Plan - Titrate supplemental oxygen for saturation goal 88 (okay with as low as 85 if asymptomatic and recovering)  - Send RVP, Strep P, Legionellae  - Obtain ECHO to evaluate for pulmonary hypertension  - Diuresis as tolerates, receiving 40 mg lasix now  - Currently on Azithromycin/Rocephin (low thresh hold to stop, remains afebrile with WBC 5.4). Will send PCT  - Continue scheduled nebs and steroids  - OOB to chair as tolerates  - Consider palliative care consult given advance disease to assist with goals of care   Best Practice (right click and "Reselect all SmartList Selections" daily)   Diet/type: Regular consistency (see orders) DVT prophylaxis: LMWH GI prophylaxis: N/A Lines: N/A Foley:  N/A Code Status:  full code Last date of multidisciplinary goals of care discussion Family updated at bedside 11/22   Labs   CBC: Recent Labs  Lab 05/24/22 1036 05/29/22 1220 05/30/22 0503 05/31/22  0502  WBC 6.9 7.8 5.7 5.4  NEUTROABS 5.0  --   --  5.1  HGB 9.7* 8.7* 8.6* 7.7*  HCT 30.1* 27.3* 27.4* 24.6*  MCV 110.7* 111.4* 110.9* 112.3*  PLT 237 206 169 125*    Basic Metabolic Panel: Recent Labs  Lab 05/24/22 1036 05/29/22 1220 05/30/22 0503  NA 141 139 140  K 3.7 3.7 4.4   CL 106 102 106  CO2 29 27 27   GLUCOSE 107* 115* 130*  BUN 11 16 17   CREATININE 0.74 0.71 0.58  CALCIUM 9.0 8.6* 9.1   GFR: Estimated Creatinine Clearance: 51.1 mL/min (by C-G formula based on SCr of 0.58 mg/dL). Recent Labs  Lab 05/24/22 1036 05/29/22 1220 05/30/22 0503 05/31/22 0502  WBC 6.9 7.8 5.7 5.4    Liver Function Tests: Recent Labs  Lab 05/24/22 1036 05/30/22 0503  AST 20 23  ALT 6 9  ALKPHOS 55 40  BILITOT 0.4 0.4  PROT 6.9 7.0  ALBUMIN 3.0* 2.4*   No results for input(s): "LIPASE", "AMYLASE" in the last 168 hours. No results for input(s): "AMMONIA" in the last 168 hours.  ABG    Component Value Date/Time   HCO3 30.6 (H) 05/29/2022 1312   TCO2 24 12/22/2014 1458   O2SAT 85.1 05/29/2022 1312     Coagulation Profile: No results for input(s): "INR", "PROTIME" in the last 168 hours.  Cardiac Enzymes: No results for input(s): "CKTOTAL", "CKMB", "CKMBINDEX", "TROPONINI" in the last 168 hours.  HbA1C: Hgb A1c MFr Bld  Date/Time Value Ref Range Status  05/26/2021 11:43 AM 5.9 4.6 - 6.5 % Final    Comment:    Glycemic Control Guidelines for People with Diabetes:Non Diabetic:  <6%Goal of Therapy: <7%Additional Action Suggested:  >8%   11/19/2019 08:30 AM 5.8 4.6 - 6.5 % Final    Comment:    Glycemic Control Guidelines for People with Diabetes:Non Diabetic:  <6%Goal of Therapy: <7%Additional Action Suggested:  >8%     CBG: No results for input(s): "GLUCAP" in the last 168 hours.  Review of Systems:   Denies SOB. Denies Cough or sputum production   Past Medical History:  She,  has a past medical history of Alcoholism in remission ([place]) (08/27/2007), Anemia, Arthritis, Breast cancer ([place]), COPD (12/17/2006), Diverticulosis (03/27/2007), GERD (05/01/2007), HYPERGLYCEMIA (08/27/2007), HYPERLIPIDEMIA (12/17/2006), HYPERTENSION (12/17/2006), Mood disorder ([place]), Pneumonia, Rectal polyp (03/27/2007), SOB (shortness of breath) on exertion, and TIA (transient ischemic  attack).   Surgical History:   Past Surgical History:  Procedure Laterality Date   ABDOMINAL HYSTERECTOMY  1978   fibroma   APPENDECTOMY  2002   BREAST EXCISIONAL BIOPSY Right 2003   negative   BREAST LUMPECTOMY WITH RADIOACTIVE SEED LOCALIZATION Left 03/23/2020   Procedure: LEFT BREAST LUMPECTOMY WITH RADIOACTIVE SEED LOCALIZATION;  Surgeon: [name], MD;  Location: [place] [facility];  Service: General;  Laterality: Left;  RNFA   CARPAL TUNNEL RELEASE Left 2010 or 2011   CATARACT EXTRACTION W/ INTRAOCULAR LENS  IMPLANT, BILATERAL Bilateral    COLONOSCOPY W/ POLYPECTOMY  03/27/2007; n7/19/2012   2008: 4 mm adenoma, diverticulosis 2012: ileitis ? NSAID - likely, 2-3 mm cecal polyp LYMPHOID FOLLICLE, diverticulosis   ESOPHAGOGASTRODUODENOSCOPY  01/26/2011   reflux esophagitis, colonscopy done also   HAMMER TOE SURGERY  2008   left foot   HERNIA REPAIR Right 1996?   IR ANGIO INTRA EXTRACRAN SEL COM CAROTID INNOMINATE BILAT MOD SED  10/25/2021   IR ANGIO INTRA EXTRACRAN SEL COM CAROTID INNOMINATE UNI R MOD SED  11/28/2018   IR ANGIO INTRA EXTRACRAN SEL COM CAROTID INNOMINATE UNI R MOD SED  03/17/2021   IR ANGIO INTRA EXTRACRAN SEL INTERNAL CAROTID UNI R MOD SED  12/30/2018   IR ANGIO VERTEBRAL SEL SUBCLAVIAN INNOMINATE UNI R MOD SED  11/28/2018   IR ANGIO VERTEBRAL SEL SUBCLAVIAN INNOMINATE UNI R MOD SED  10/26/2021   IR ANGIO VERTEBRAL SEL VERTEBRAL UNI R MOD SED  03/18/2021   IR ANGIOGRAM FOLLOW UP STUDY  12/30/2018   IR TRANSCATH/EMBOLIZ  12/30/2018   IR US GUIDE VASC ACCESS RIGHT  11/28/2018   IR US GUIDE VASC ACCESS RIGHT  03/17/2021   ORIF ANKLE FRACTURE Right 12/22/2014   Procedure: OPEN REDUCTION INTERNAL FIXATION (ORIF) ANKLE FRACTURE;  Surgeon: Susa Day, MD;  Location: [facility];  Service: Orthopedics;  Laterality: Right;   RADIOLOGY WITH ANESTHESIA N/A 12/30/2018   Procedure: RADIOLOGY WITH ANESTHESIA;  Surgeon: Luanne Bras, MD;  Location: [facility];  Service: Radiology;  Laterality: N/A;    SHOULDER OPEN ROTATOR CUFF REPAIR Left 03/06/2013   Procedure: LEFT ROTATOR CUFF REPAIR, SUBACROMIAL DECOMPRESSION, PATCH GRAFT, MANIPULATION UNDER ANESTHESIA;  Surgeon: Johnn Hai, MD;  Location: [facility];  Service: Orthopedics;  Laterality: Left;   TONSILLECTOMY       Social History:   reports that she quit smoking about 37 years ago. Her smoking use included cigarettes. She has a 60.00 pack-year smoking history. She has never used smokeless tobacco. She reports that she does not drink alcohol and does not use drugs.   Family History:  Her family history includes Cancer in her brother; Heart attack (age of onset: 57) in her father; Heart disease in her brother; Idiopathic pulmonary fibrosis (age of onset: 68) in her brother; Throat cancer in her mother. There is no history of Colon cancer, Esophageal cancer, or Stomach cancer.   Allergies Allergies  Allergen Reactions   Alcohol Other (See Comments)    "Recovering Alcoholic*   Cheese Nausea And Vomiting   Dilaudid [Hydromorphone Hcl] Other (See Comments)    Pt had hypotension after dilaudid 1mg  IV while in the OR, required temporary pressor intervention.   Phenytoin Sodium Extended Other (See Comments)    Reaction not cited   Diclofenac Sodium Rash    Patient does not recall this as an allergy.      Home Medications  Prior to Admission medications   Medication Sig Start Date End Date Taking? Authorizing Provider  albuterol (PROAIR HFA) 108 (90 Base) MCG/ACT inhaler INHALE 2 PUFFS EVERY 4 HOURS AS NEEDED FOR COUGHING SPELLS Patient taking differently: Inhale 2 puffs into the lungs every 4 (four) hours as needed ("for coughing spells"). 05/24/20  Yes Marin Olp, MD  aspirin 325 MG tablet Take 1 tablet (325 mg total) by mouth daily. 01/01/19  Yes Louk, Alexandra M, PA-C  benzonatate (TESSALON) 200 MG capsule Take 1 capsule (200 mg total) by mouth 3 (three) times daily as needed for cough. Patient taking differently: Take 200 mg  by mouth See admin instructions. Take 200 mg by mouth in the morning and an additional 200 mg two times a day as needed for coughing 02/21/22  Yes Marin Olp, MD  BEVESPI AEROSPHERE 9-4.8 MCG/ACT AERO Inhale 2 puffs into the lungs 2 (two) times daily. 05/24/22  Yes [provider]  Cholecalciferol (VITAMIN D3) 50 MCG (2000 UT) TABS Take 2,000 Units by mouth in the morning.   Yes [provider]  clopidogrel (PLAVIX) 75 MG tablet Take 0.5 tablets (  37.5 mg total) by mouth daily. 08/03/21  Yes Marin Olp, MD  Coenzyme Q10 300 MG CAPS Take 300 mg by mouth daily.   Yes [provider]  fexofenadine (ALLEGRA) 180 MG tablet Take 180 mg by mouth daily.   Yes [provider]  folic acid (FOLVITE) 1 MG tablet TAKE 1 TABLET(1 MG) BY MOUTH DAILY Patient taking differently: Take 1 mg by mouth daily. 03/15/22  Yes Curt Bears, MD  guaiFENesin (MUCINEX) 600 MG 12 hr tablet Take 600 mg by mouth daily.   Yes [provider]  lactose free nutrition (BOOST) LIQD Take 237 mLs by mouth daily.   Yes [provider]  montelukast (SINGULAIR) 10 MG tablet TAKE 1 TABLET(10 MG) BY MOUTH AT BEDTIME Patient taking differently: Take 10 mg by mouth at bedtime. 04/21/22  Yes Cobb, Karie Schwalbe, NP  Multiple Vitamin (MULTIVITAMIN WITH MINERALS) TABS tablet Take 1 tablet by mouth daily with breakfast.   Yes [provider]  pantoprazole (PROTONIX) 40 MG tablet TAKE 1 TABLET(40 MG) BY MOUTH DAILY Patient taking differently: Take 40 mg by mouth daily. 04/21/22  Yes Cobb, Karie Schwalbe, NP  potassium chloride SA (KLOR-CON M) 20 MEQ tablet Take 20 mEq by mouth every other day. 04/01/22  Yes [provider]  rosuvastatin (CRESTOR) 10 MG tablet Take 1 tablet (10 mg total) by mouth daily. 05/25/22  Yes Marin Olp, MD  tamoxifen (NOLVADEX) 20 MG tablet Take 1 tablet (20 mg total) by mouth daily. 09/12/21  Yes Alla Feeling, NP  traMADol (ULTRAM) 50 MG  tablet TAKE 1 TABLET BY MOUTH EVERY 8 HOURS FOR UP TO 5 DAYS AS NEEDED FOR PAIN. DO NOT DRIVE FOR 8 HOURS AFTER TAKING Patient taking differently: Take 50 mg by mouth daily as needed for moderate pain. 03/08/22  Yes Marin Olp, MD  traZODone (DESYREL) 50 MG tablet TAKE 1/2- 1 TABLET BY MOUTH EVERY NIGHT AT BEDTIME AS NEEDED SLEEP Patient taking differently: Take 50 mg by mouth at bedtime. 01/18/22  Yes Marin Olp, MD  TYLENOL 8 HOUR ARTHRITIS PAIN 650 MG CR tablet Take 1,300 mg by mouth in the morning and at bedtime.   Yes [provider]  vitamin B-12 (CYANOCOBALAMIN) 1000 MCG tablet Take 1,000 mcg by mouth daily.   Yes [provider]  Budeson-Glycopyrrol-Formoterol (BREZTRI AEROSPHERE) 160-9-4.8 MCG/ACT AERO Inhale 2 puffs into the lungs in the morning and at bedtime. Patient not taking: Reported on 05/29/2022 04/26/22   Cobb, Karie Schwalbe, NP  budesonide (PULMICORT FLEXHALER) 180 MCG/ACT inhaler Inhale 1 puff into the lungs 2 (two) times daily. Patient not taking: Reported on 05/29/2022 04/28/22 04/28/23  Clayton Bibles, NP  promethazine-dextromethorphan (PROMETHAZINE-DM) 6.25-15 MG/5ML syrup Take 5 mLs by mouth 4 (four) times daily as needed for cough. Patient not taking: Reported on 05/29/2022 04/26/22   Clayton Bibles, NP     Time Spent 55 minutes     Hayden Pedro, AGACNP-BC [facility]  PCCM Pgr: [phone]

## 2022-05-31 NOTE — Progress Notes (Signed)
Pulmonology and hospitalist team notified of bloody sputum nursing staff witnessed patient cough up. Will continue with current treatment plan.

## 2022-05-31 NOTE — Progress Notes (Incomplete)
{  Select Note:[id number]} 

## 2022-06-01 DIAGNOSIS — Z515 Encounter for palliative care: Principal | ICD-10-CM

## 2022-06-01 DIAGNOSIS — Z7189 Other specified counseling: Secondary | ICD-10-CM

## 2022-06-01 DIAGNOSIS — J441 Chronic obstructive pulmonary disease with (acute) exacerbation: Secondary | ICD-10-CM | POA: Diagnosis not present

## 2022-06-01 DIAGNOSIS — I10 Essential (primary) hypertension: Secondary | ICD-10-CM | POA: Diagnosis not present

## 2022-06-01 DIAGNOSIS — J9621 Acute and chronic respiratory failure with hypoxia: Secondary | ICD-10-CM | POA: Diagnosis not present

## 2022-06-01 DIAGNOSIS — C3491 Malignant neoplasm of unspecified part of right bronchus or lung: Secondary | ICD-10-CM | POA: Diagnosis not present

## 2022-06-01 DIAGNOSIS — E440 Moderate protein-calorie malnutrition: Secondary | ICD-10-CM

## 2022-06-01 LAB — RENAL FUNCTION PANEL
Albumin: 2.4 g/dL — ABNORMAL LOW (ref 3.5–5.0)
Anion gap: 4 — ABNORMAL LOW (ref 5–15)
BUN: 19 mg/dL (ref 8–23)
CO2: 32 mmol/L (ref 22–32)
Calcium: 8.9 mg/dL (ref 8.9–10.3)
Chloride: 106 mmol/L (ref 98–111)
Creatinine, Ser: 0.73 mg/dL (ref 0.44–1.00)
GFR, Estimated: >60 mL/min (ref >60)
Glucose, Bld: 152 mg/dL — ABNORMAL HIGH (ref 70–99)
Phosphorus: 4.1 mg/dL (ref 2.5–4.6)
Potassium: 4.2 mmol/L (ref 3.5–5.1)
Sodium: 142 mmol/L (ref 135–145)

## 2022-06-01 LAB — CBC WITH DIFFERENTIAL/PLATELET
Abs Immature Granulocytes: 0.2 10*3/uL — ABNORMAL HIGH (ref 0.00–0.07)
Basophils Absolute: 0 10*3/uL (ref 0.0–0.1)
Basophils Relative: 0 %
Eosinophils Absolute: 0 10*3/uL (ref 0.0–0.5)
Eosinophils Relative: 0 %
HCT: 24.8 % — ABNORMAL LOW (ref 36.0–46.0)
Hemoglobin: 7.9 g/dL — ABNORMAL LOW (ref 12.0–15.0)
Immature Granulocytes: 5 %
Lymphocytes Relative: 4 %
Lymphs Abs: 0.1 10*3/uL — ABNORMAL LOW (ref 0.7–4.0)
MCH: 35.6 pg — ABNORMAL HIGH (ref 26.0–34.0)
MCHC: 31.9 g/dL (ref 30.0–36.0)
MCV: 111.7 fL — ABNORMAL HIGH (ref 80.0–100.0)
Monocytes Absolute: 0.5 10*3/uL (ref 0.1–1.0)
Monocytes Relative: 12 %
Neutro Abs: 3 10*3/uL (ref 1.7–7.7)
Neutrophils Relative %: 79 %
Platelets: 107 10*3/uL — ABNORMAL LOW (ref 150–400)
RBC: 2.22 MIL/uL — ABNORMAL LOW (ref 3.87–5.11)
RDW: 14.6 % (ref 11.5–15.5)
WBC: 3.8 10*3/uL — ABNORMAL LOW (ref 4.0–10.5)
nRBC: 0 % (ref 0.0–0.2)

## 2022-06-01 LAB — LEGIONELLA PNEUMOPHILA SEROGP 1 UR AG: L. pneumophila Serogp 1 Ur Ag: NEGATIVE

## 2022-06-01 LAB — MAGNESIUM: Magnesium: 1.7 mg/dL (ref 1.7–2.4)

## 2022-06-01 MED ORDER — MAGNESIUM SULFATE 2 GM/50ML IV SOLN
2.0000 g | Freq: Once | INTRAVENOUS | Status: AC
  Administered 2022-06-01: 2 g via INTRAVENOUS
  Filled 2022-06-01: qty 50

## 2022-06-01 MED ORDER — FUROSEMIDE 10 MG/ML IJ SOLN
40.0000 mg | Freq: Once | INTRAMUSCULAR | Status: AC
  Administered 2022-06-01: 40 mg via INTRAVENOUS
  Filled 2022-06-01: qty 4

## 2022-06-01 NOTE — Consult Note (Signed)
NAME:  Sheila Shields, MRN:  [id number], DOB:  [birth year], LOS: 3 ADMISSION DATE:  05/29/2022, CONSULTATION DATE:  05/31/2022 REFERRING MD:  Dr. Grandville Silos, CHIEF COMPLAINT: Hypoxia.    History of Present Illness:  87 year old female with PMH COPD (follows Dr. 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 Lisbeth Renshaw with Radiation Oncology). Completed Palliative radiation to right pelvis and thoracic spine 7/24.   11/20 patient presents to ER with reported progressive shortness of breath, cough, wheezing for last 4 days. On arrival oxygen saturation 44% on 5L [place]. CT PE negative for PE, noted pulmonary edema and severe chronic lung disease with diffuse interstitial thickening and patchy airspace opacities which have progressed from previous. Started on Rocephin/Azithromycin. Requiring 8-15L nasal cannula. Pulmonary consulted.   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 No distress. Has some productive sputum with blood streaked mucous. No frank hemoptysis  Objective   Blood pressure (!) 146/71, pulse 81, temperature (!) 97.5 F (36.4 C), temperature source Oral, resp. rate 20, height 5\' 5"  (1.651 m), weight 72.1 kg, SpO2 90 %.        Intake/Output Summary (Last 24 hours) at 06/01/2022 1013 Last data filed at 06/01/2022 0400 Gross per 24  hour  Intake 261.59 ml  Output 900 ml  Net -638.41 ml   Filed Weights   05/29/22 2013 05/31/22 0830 06/01/22 0500  Weight: 70 kg 71.8 kg 72.1 kg   Physical Exam: General: Elderly frail -appearing, no acute distress HENT: , AT, OP clear, MMM Eyes: EOMI, no scleral icterus Respiratory: Coarse breath sounds to auscultation bilaterally.  No crackles, wheezing or rales Cardiovascular: RRR, -M/R/G, no JVD GI: BS+, soft, nontender Extremities:-Edema,-tenderness Neuro: AAO x4, CNII-XII grossly intact Skin: Intact, no rashes or bruising Psych: Normal mood, normal affect  Resolved Hospital Problem list     Assessment & Plan:   Acute on chronic hypoxemic respiratory failure 2/2 pulmonary edema/pleural effusion +/- pneumonitis COPD exacerbation Severe emphysema  Chest imaging reviewed from 11/202/23 which is negative for PE but demonstrates increased septal thickening and ground glass opacities with patchy consolidation bilaterally, enlarged left pleural effusion, superimposed on severe centrilobular and paraseptal emphysema. BNP 637, procal neg and normal WBC. Echo reviewed with normal EF but interventricular septum flattening. Normal pulmonary artery systolic pressures.   Assessment: Chronically ill female with multiple co-morbidities as noted above with increased O2 requirement likely due to increased volume overload as evidenced on echocardiogram. No evidence of PH on echo. Would not aggressively pursue RHC as she would need to be euvolemic and diagnosis would unlikely change management. RVP 20 neg and strep pn neg.  Plan: - Wean supplemental O2 for saturation goal 88. Ok for desaturation as long as she recovers in <5 minutes and remains not in distress.  - Diuresis BID today - DC ceftriaxone. Continue azithro - Continue scheduled nebs and steroids  - OOB  to chair as tolerates  - Consider palliative care consult given advance disease to assist with goals of care   Best Practice  (right click and "Reselect all SmartList Selections" daily)   Diet/type: Regular consistency (see orders) DVT prophylaxis: LMWH GI prophylaxis: N/A Lines: N/A Foley:  N/A Code Status:  full code Last date of multidisciplinary goals of care discussion Family updated at bedside 11/22   Home Medications  Prior to Admission medications   Medication Sig Start Date End Date Taking? Authorizing Provider  albuterol (PROAIR HFA) 108 (90 Base) MCG/ACT inhaler INHALE 2 PUFFS EVERY 4 HOURS AS NEEDED FOR COUGHING SPELLS Patient taking differently: Inhale 2 puffs into the lungs every 4 (four) hours as needed ("for coughing spells"). 05/24/20  Yes Marin Olp, MD  aspirin 325 MG tablet Take 1 tablet (325 mg total) by mouth daily. 01/01/19  Yes Louk, Alexandra M, PA-C  benzonatate (TESSALON) 200 MG capsule Take 1 capsule (200 mg total) by mouth 3 (three) times daily as needed for cough. Patient taking differently: Take 200 mg by mouth See admin instructions. Take 200 mg by mouth in the morning and an additional 200 mg two times a day as needed for coughing 02/21/22  Yes Marin Olp, MD  BEVESPI AEROSPHERE 9-4.8 MCG/ACT AERO Inhale 2 puffs into the lungs 2 (two) times daily. 05/24/22  Yes [provider]  Cholecalciferol (VITAMIN D3) 50 MCG (2000 UT) TABS Take 2,000 Units by mouth in the morning.   Yes [provider]  clopidogrel (PLAVIX) 75 MG tablet Take 0.5 tablets (37.5 mg total) by mouth daily. 08/03/21  Yes Marin Olp, MD  Coenzyme Q10 300 MG CAPS Take 300 mg by mouth daily.   Yes [provider]  fexofenadine (ALLEGRA) 180 MG tablet Take 180 mg by mouth daily.   Yes [provider]  folic acid (FOLVITE) 1 MG tablet TAKE 1 TABLET(1 MG) BY MOUTH DAILY Patient taking differently: Take 1 mg by mouth daily. 03/15/22  Yes Curt Bears, MD  guaiFENesin (MUCINEX) 600 MG 12 hr tablet Take 600 mg by mouth daily.   Yes [provider]  lactose free  nutrition (BOOST) LIQD Take 237 mLs by mouth daily.   Yes [provider]  montelukast (SINGULAIR) 10 MG tablet TAKE 1 TABLET(10 MG) BY MOUTH AT BEDTIME Patient taking differently: Take 10 mg by mouth at bedtime. 04/21/22  Yes Cobb, Karie Schwalbe, NP  Multiple Vitamin (MULTIVITAMIN WITH MINERALS) TABS tablet Take 1 tablet by mouth daily with breakfast.   Yes [provider]  pantoprazole (PROTONIX) 40 MG tablet TAKE 1 TABLET(40 MG) BY MOUTH DAILY Patient taking differently: Take 40 mg by mouth daily. 04/21/22  Yes Cobb, Karie Schwalbe, NP  potassium chloride SA (KLOR-CON M) 20 MEQ tablet Take 20 mEq by mouth every other day. 04/01/22  Yes [provider]  rosuvastatin (CRESTOR) 10 MG tablet Take 1 tablet (10 mg total) by mouth daily. 05/25/22  Yes Marin Olp, MD  tamoxifen (NOLVADEX) 20 MG tablet Take 1 tablet (20 mg total) by mouth daily. 09/12/21  Yes Alla Feeling, NP  traMADol (ULTRAM) 50 MG tablet TAKE 1 TABLET BY MOUTH EVERY 8 HOURS FOR UP TO 5 DAYS AS NEEDED FOR PAIN. DO NOT DRIVE FOR 8 HOURS AFTER TAKING Patient taking differently: Take 50 mg by mouth daily as needed for moderate pain. 03/08/22  Yes Marin Olp, MD  traZODone (DESYREL) 50 MG tablet TAKE 1/2- 1 TABLET BY MOUTH EVERY  NIGHT AT BEDTIME AS NEEDED SLEEP Patient taking differently: Take 50 mg by mouth at bedtime. 01/18/22  Yes Marin Olp, MD  TYLENOL 8 HOUR ARTHRITIS PAIN 650 MG CR tablet Take 1,300 mg by mouth in the morning and at bedtime.   Yes [provider]  vitamin B-12 (CYANOCOBALAMIN) 1000 MCG tablet Take 1,000 mcg by mouth daily.   Yes [provider]  Budeson-Glycopyrrol-Formoterol (BREZTRI AEROSPHERE) 160-9-4.8 MCG/ACT AERO Inhale 2 puffs into the lungs in the morning and at bedtime. Patient not taking: Reported on 05/29/2022 04/26/22   Cobb, Karie Schwalbe, NP  budesonide (PULMICORT FLEXHALER) 180 MCG/ACT inhaler Inhale 1 puff into the lungs 2 (two) times  daily. Patient not taking: Reported on 05/29/2022 04/28/22 04/28/23  Clayton Bibles, NP  promethazine-dextromethorphan (PROMETHAZINE-DM) 6.25-15 MG/5ML syrup Take 5 mLs by mouth 4 (four) times daily as needed for cough. Patient not taking: Reported on 05/29/2022 04/26/22   Clayton Bibles, NP     Time Spent 35 minutes     Rodman Pickle, M.D. [facility] 06/01/2022 10:15 AM   See Amion for personal pager For hours between 7 PM to 7 AM, please call [facility] for urgent questions

## 2022-06-01 NOTE — Progress Notes (Signed)
Peripheral IV infiltrated, removed per symptomatic site. IV team consult order placed for difficult IV insertion

## 2022-06-01 NOTE — Progress Notes (Signed)
PROGRESS NOTE    Sheila Shields  PIR:[id number] DOB: 1935-01-23 DOA: 05/29/2022 PCP: Marin Olp, MD    Chief Complaint  Patient presents with   Shortness of Breath    Brief Narrative:  Sheila Shields is a 87 y.o. female with PMH significant for stage IV adenocarcinoma of the lungs, history of left breast cancer, HTN, HLD, COPD on 4-5L oxygen at home, GERD, anxiety, osteoarthritis 11/20, patient presented to the ED from home with complaint of 4 days of worsening shortness of breath, wheezing, weakness, cough, cyanosis. EMS noted her O2 sat at 44% on 5 L.  Give her albuterol, Atrovent, IV Solu-Medrol.   In the ED, patient was afebrile, heart rate 113, blood pressure 91/57, respiratory in 20s and low 30s, required nonrebreather mask in the ED. Blood gas with pH 7.45, PCO2 44, bicarb 31, BNP 500, WBC count 7.8, hemoglobin 8.7 Respiratory virus panel negative for flu, COVID CT angio chest did not show any evidence of acute pulm embolism.  Showed severe chronic lung disease with diffuse interstitial thickening, patchy airspace opacities which have progressed compared to prior CT scan from August. Patient was given bronchodilators, IV Solu-Medrol, empiric IV antibiotics Admitted to hospitalist service   Assessment & Plan:   Principal Problem:   Acute on chronic respiratory failure with hypoxia ([place]) Active Problems:   Hyperlipemia   Essential hypertension   Gastroesophageal reflux disease   Adenocarcinoma of right lung, stage 4 (HCC)   COPD with acute exacerbation (HCC)   Acute on chronic hypoxic respiratory failure (HCC)   Malnutrition of moderate degree   COPD exacerbation (HCC)  #1 acute on chronic respiratory failure with hypoxemia/acute exacerbation of COPD -Patient noted chronically on home O2, 4 to 5 L oxygen in the setting of severe COPD and stage IV adenocarcinoma of the lung. -Patient presented to the ED with worsening shortness of breath requiring nonrebreather in  the ED, imaging showed severe chronic lung disease diffuse interstitial thickening, patchy airspace opacities unclear of acute component. -CT chest done negative for PE however showed severe chronic lung disease with diffuse interstitial thickening, patchy airspace opacities which are progressed compared to prior CT scan from August. -Patient with worsening O2 requirements currently on 11 L high flow nasal cannula. -Urine Legionella antigen negative, urine pneumococcus antigen negative. -Continue empiric IV azithromycin, IV Solu-Medrol twice daily, bronchodilators, Tessalon Perles, Mucinex. -IV Rocephin discontinued per PCCM. -Per Dr. Pietro Cassis goal O2 will be close to 85% as patient seems fairly comfortable with low O2 saturations. -Patient noted however with worsening progressive shortness of breath on minimal exertion. -VBG on admission with a pH of 7.45, pO2 of 53, bicarb of 30.6, O2 sats of 85.1%. -BNP noted elevated at 500.5 on admission, with repeat BNP of 637.6. -Patient received a dose of Lasix 40 mg IV x 1 05/31/2022 with a urine output of 1.450 L over the past 24 hours. -Patient still with increased O2 requirements, pulmonary recommending another day of Lasix 40 mg IV twice daily, strict I's and O's, daily weights. -PCCM following and appreciate input and recommendations. -Palliative care consult for goals of care.  2.  Stage IV lung cancer -Being followed by Dr. Curt Bears in the outpatient setting. -Oncology informed of admission via epic. -Outpatient follow-up.  3.  Hyperlipidemia -Statin.  4.  Hypertension -Patient noted not on antihypertensive medications prior to admission. -BP stable. -Monitor BP with diuresis.  5.  GERD -Continue PPI.  6.  History of breast cancer -Tamoxifen.  7.  Chronic macrocytic anemia -Hemoglobin currently stable at 7.9. -Continue vitamin H37, folic acid supplementation. -Transfusion threshold hemoglobin , 7   DVT prophylaxis:  Lovenox Code Status: Full Family Communication: Updated patient, family at bedside Disposition: TBD  Status is: Inpatient Remains inpatient appropriate because: Severity of illness   Consultants:  PCCM: Dr. Loanne Drilling 05/31/2022  Procedures:  CT angiogram chest 05/29/2022 Chest x-ray 05/29/2022 2D echo 05/31/2022   Antimicrobials:  IV azithromycin 05/29/2022>>>> IV Rocephin 05/29/2022>>>>> 06/01/2022   Subjective: Sitting up in bed.  Family at bedside.  Denies any chest pain.  States gets short of breath on minimal exertion which is unchanged.  States she feels fine laying at rest.  Still on 11 L high flow nasal cannula.  Per family patient also desats when speaking.  Objective: Vitals:   06/01/22 0500 06/01/22 0538 06/01/22 0833 06/01/22 1300  BP:  (!) 146/71  103/67  Pulse:  81  92  Resp:  20  18  Temp:  (!) 97.5 F (36.4 C)  97.6 F (36.4 C)  TempSrc:  Oral  Oral  SpO2:  91% 90% 90%  Weight: 72.1 kg     Height:        Intake/Output Summary (Last 24 hours) at 06/01/2022 1611 Last data filed at 06/01/2022 1500 Gross per 24 hour  Intake 261.59 ml  Output 1150 ml  Net -888.41 ml   Filed Weights   05/29/22 2013 05/31/22 0830 06/01/22 0500  Weight: 70 kg 71.8 kg 72.1 kg    Examination:  General exam: Appears calm and comfortable  Respiratory system: Decreased breath sounds in the bases.  Fine crackles noted R > L.  Fair air movement.  Speaking in full sentences.  On 11 L high flow nasal cannula with sats of 90%.  Cardiovascular system: S1 & S2 heard, RRR. No JVD, murmurs, rubs, gallops or clicks. No pedal edema. Gastrointestinal system: Abdomen is nondistended, soft and nontender. No organomegaly or masses felt. Normal bowel sounds heard. Central nervous system: Alert and oriented. No focal neurological deficits. Extremities: Symmetric 5 x 5 power. Skin: No rashes, lesions or ulcers Psychiatry: Judgement and insight appear normal. Mood & affect appropriate.      Data Reviewed: I have personally reviewed following labs and imaging studies  CBC: Recent Labs  Lab 05/29/22 1220 05/30/22 0503 05/31/22 0502 06/01/22 0458  WBC 7.8 5.7 5.4 3.8*  NEUTROABS  --   --  5.1 3.0  HGB 8.7* 8.6* 7.7* 7.9*  HCT 27.3* 27.4* 24.6* 24.8*  MCV 111.4* 110.9* 112.3* 111.7*  PLT 206 169 125* 107*    Basic Metabolic Panel: Recent Labs  Lab 05/29/22 1220 05/30/22 0503 06/01/22 0458  NA 139 140 142  K 3.7 4.4 4.2  CL 102 106 106  CO2 27 27 32  GLUCOSE 115* 130* 152*  BUN 16 17 19   CREATININE 0.71 0.58 0.73  CALCIUM 8.6* 9.1 8.9  MG  --   --  1.7  PHOS  --   --  4.1    GFR: Estimated Creatinine Clearance: 51.1 mL/min (by C-G formula based on SCr of 0.73 mg/dL).  Liver Function Tests: Recent Labs  Lab 05/30/22 0503 06/01/22 0458  AST 23  --   ALT 9  --   ALKPHOS 40  --   BILITOT 0.4  --   PROT 7.0  --   ALBUMIN 2.4* 2.4*    CBG: No results for input(s): "GLUCAP" in the last 168 hours.   Recent Results (from the past  240 hour(s))  Resp Panel by RT-PCR (Flu A&B, Covid) Anterior Nasal Swab     Status: None   Collection Time: 05/29/22 12:28 PM   Specimen: Anterior Nasal Swab  Result Value Ref Range Status   SARS Coronavirus 2 by RT PCR NEGATIVE NEGATIVE Final    Comment: (NOTE) SARS-CoV-2 target nucleic acids are NOT DETECTED.  The SARS-CoV-2 RNA is generally detectable in upper respiratory specimens during the acute phase of infection. The lowest concentration of SARS-CoV-2 viral copies this assay can detect is 138 copies/mL. A negative result does not preclude SARS-Cov-2 infection and should not be used as the sole basis for treatment or other patient management decisions. A negative result may occur with  improper specimen collection/handling, submission of specimen other than nasopharyngeal swab, presence of viral mutation(s) within the areas targeted by this assay, and inadequate number of viral copies(<138 copies/mL). A  negative result must be combined with clinical observations, patient history, and epidemiological information. The expected result is Negative.  Fact Sheet for Patients:  EntrepreneurPulse.com.au  Fact Sheet for Healthcare Providers:  IncredibleEmployment.be  This test is no t yet approved or cleared by the [place] FDA and  has been authorized for detection and/or diagnosis of SARS-CoV-2 by FDA under an Emergency Use Authorization (EUA). This EUA will remain  in effect (meaning this test can be used) for the duration of the COVID-19 declaration under Section 564(b)(1) of the Act, 21 U.S.C.section 360bbb-3(b)(1), unless the authorization is terminated  or revoked sooner.       Influenza A by PCR NEGATIVE NEGATIVE Final   Influenza B by PCR NEGATIVE NEGATIVE Final    Comment: (NOTE) The Xpert Xpress SARS-CoV-2/FLU/RSV plus assay is intended as an aid in the diagnosis of influenza from Nasopharyngeal swab specimens and should not be used as a sole basis for treatment. Nasal washings and aspirates are unacceptable for Xpert Xpress SARS-CoV-2/FLU/RSV testing.  Fact Sheet for Patients: EntrepreneurPulse.com.au  Fact Sheet for Healthcare Providers: IncredibleEmployment.be  This test is not yet approved or cleared by the [place] FDA and has been authorized for detection and/or diagnosis of SARS-CoV-2 by FDA under an Emergency Use Authorization (EUA). This EUA will remain in effect (meaning this test can be used) for the duration of the COVID-19 declaration under Section 564(b)(1) of the Act, 21 U.S.C. section 360bbb-3(b)(1), unless the authorization is terminated or revoked.  Performed at [facility], [place] [address], [place], [place] [address]   Respiratory (~20 pathogens) panel by PCR     Status: None   Collection Time: 05/31/22 12:05 PM   Specimen: Nasopharyngeal Swab;  Respiratory  Result Value Ref Range Status   Adenovirus NOT DETECTED NOT DETECTED Final   Coronavirus 229E NOT DETECTED NOT DETECTED Final    Comment: (NOTE) The Coronavirus on the Respiratory Panel, DOES NOT test for the novel  Coronavirus (2019 nCoV)    Coronavirus HKU1 NOT DETECTED NOT DETECTED Final   Coronavirus NL63 NOT DETECTED NOT DETECTED Final   Coronavirus OC43 NOT DETECTED NOT DETECTED Final   Metapneumovirus NOT DETECTED NOT DETECTED Final   Rhinovirus / Enterovirus NOT DETECTED NOT DETECTED Final   Influenza A NOT DETECTED NOT DETECTED Final   Influenza B NOT DETECTED NOT DETECTED Final   Parainfluenza Virus 1 NOT DETECTED NOT DETECTED Final   Parainfluenza Virus 2 NOT DETECTED NOT DETECTED Final   Parainfluenza Virus 3 NOT DETECTED NOT DETECTED Final   Parainfluenza Virus 4 NOT DETECTED NOT DETECTED Final  Respiratory Syncytial Virus NOT DETECTED NOT DETECTED Final   Bordetella pertussis NOT DETECTED NOT DETECTED Final   Bordetella Parapertussis NOT DETECTED NOT DETECTED Final   Chlamydophila pneumoniae NOT DETECTED NOT DETECTED Final   Mycoplasma pneumoniae NOT DETECTED NOT DETECTED Final    Comment: Performed at [facility], [place] [address], [place], [place] [address]         Radiology Studies: ECHOCARDIOGRAM COMPLETE  Result Date: 05/31/2022    ECHOCARDIOGRAM REPORT   Patient Name:   LEXUS SHAMPINE Date of Exam: 05/31/2022 Medical Rec #:  [id number]     Height:       65.0 in Accession #:    [id number]    Weight:       158.3 lb Date of Birth:  12/09/1934     BSA:          1.791 m Patient Age:    52 years      BP:           156/73 mmHg Patient Gender: F             HR:           82 bpm. Exam Location:  Inpatient Procedure: 2D Echo Indications:    pulmonary hypertension  History:        Patient has prior history of Echocardiogram examinations, most                 recent 06/29/2020. COPD; Risk Factors:Dyslipidemia and                 Hypertension.  Sonographer:     Harvie Junior Referring Phys: [place]  1. Left ventricular ejection fraction, by estimation, is 60 to 65%. The left ventricle has normal function. The left ventricle has no regional wall motion abnormalities. Left ventricular diastolic parameters were normal. There is the interventricular septum is flattened in systole, consistent with right ventricular pressure overload.  2. Right ventricular systolic function is normal. The right ventricular size is moderately enlarged. There is normal pulmonary artery systolic pressure.  3. The mitral valve is grossly normal. No evidence of mitral valve regurgitation. No evidence of mitral stenosis.  4. Tricuspid valve regurgitation is mild to moderate.  5. The aortic valve is abnormal. Aortic valve regurgitation is not visualized. Mild aortic valve stenosis. Aortic valve mean gradient measures 14.0 mmHg. Normal LV Stroke volume and DVI.  6. The inferior vena cava is normal in size with greater than 50% respiratory variability, suggesting right atrial pressure of 3 mmHg. Comparison(s): RV is dilated from prior. FINDINGS  Left Ventricle: Left ventricular ejection fraction, by estimation, is 60 to 65%. The left ventricle has normal function. The left ventricle has no regional wall motion abnormalities. The left ventricular internal cavity size was small. There is no left ventricular hypertrophy. The interventricular septum is flattened in systole, consistent with right ventricular pressure overload. Left ventricular diastolic parameters were normal. Right Ventricle: The right ventricular size is moderately enlarged. No increase in right ventricular wall thickness. Right ventricular systolic function is normal. There is normal pulmonary artery systolic pressure. The tricuspid regurgitant velocity is 2.78 m/s, and with an assumed right atrial pressure of 3 mmHg, the estimated right ventricular systolic pressure is 44.3 mmHg. Left Atrium: Left atrial size  was normal in size. Right Atrium: Right atrial size was normal in size. Pericardium: There is no evidence of pericardial effusion. Mitral Valve: The mitral valve is grossly normal. No evidence  of mitral valve regurgitation. No evidence of mitral valve stenosis. Tricuspid Valve: The tricuspid valve is normal in structure. Tricuspid valve regurgitation is mild to moderate. No evidence of tricuspid stenosis. Aortic Valve: The aortic valve is abnormal. Aortic valve regurgitation is not visualized. Mild aortic stenosis is present. Aortic valve mean gradient measures 14.0 mmHg. Aortic valve peak gradient measures 24.4 mmHg. Aortic valve area, by VTI measures 2.10 cm. Pulmonic Valve: The pulmonic valve was not well visualized. Pulmonic valve regurgitation is not visualized. No evidence of pulmonic stenosis. Aorta: The aortic root is normal in size and structure. Venous: The inferior vena cava is normal in size with greater than 50% respiratory variability, suggesting right atrial pressure of 3 mmHg. IAS/Shunts: No atrial level shunt detected by color flow Doppler.  LEFT VENTRICLE PLAX 2D LVIDd:         4.00 cm     Diastology LVIDs:         2.60 cm     LV e' medial:    10.70 cm/s LV PW:         0.90 cm     LV E/e' medial:  7.3 LV IVS:        0.90 cm     LV e' lateral:   6.42 cm/s LVOT diam:     1.90 cm     LV E/e' lateral: 12.1 LV SV:         97 LV SV Index:   54 LVOT Area:     2.84 cm  LV Volumes (MOD) LV vol d, MOD A2C: 84.4 ml LV vol d, MOD A4C: 79.2 ml LV vol s, MOD A2C: 29.4 ml LV vol s, MOD A4C: 31.8 ml LV SV MOD A2C:     55.0 ml LV SV MOD A4C:     79.2 ml LV SV MOD BP:      51.7 ml RIGHT VENTRICLE RV Basal diam:  3.90 cm RV Mid diam:    4.40 cm TAPSE (M-mode): 1.6 cm LEFT ATRIUM             Index        RIGHT ATRIUM           Index LA diam:        2.60 cm 1.45 cm/m   RA Area:     12.00 cm LA Vol (A2C):   34.8 ml 19.43 ml/m  RA Volume:   29.40 ml  16.42 ml/m LA Vol (A4C):   37.7 ml 21.05 ml/m LA Biplane Vol:  38.0 ml 21.22 ml/m  AORTIC VALVE                     PULMONIC VALVE AV Area (Vmax):    1.88 cm      PV Vmax:          1.18 m/s AV Area (Vmean):   1.74 cm      PV Peak grad:     5.6 mmHg AV Area (VTI):     2.10 cm      PR End Diast Vel: 14.29 msec AV Vmax:           247.00 cm/s AV Vmean:          166.000 cm/s AV VTI:            0.464 m AV Peak Grad:      24.4 mmHg AV Mean Grad:      14.0 mmHg LVOT Vmax:  164.05 cm/s LVOT Vmean:        102.050 cm/s LVOT VTI:          0.343 m LVOT/AV VTI ratio: 0.74  AORTA Ao Root diam: 3.00 cm MITRAL VALVE                TRICUSPID VALVE MV Area (PHT): 3.48 cm     TR Peak grad:   30.9 mmHg MV Decel Time: 218 msec     TR Vmax:        278.00 cm/s MV E velocity: 77.60 cm/s MV A velocity: 112.00 cm/s  SHUNTS MV E/A ratio:  0.69         Systemic VTI:  0.34 m                             Systemic Diam: 1.90 cm Rudean Haskell MD Electronically signed by Rudean Haskell MD Signature Date/Time: 05/31/2022/2:30:07 PM    Final         Scheduled Meds:  acetaminophen  1,000 mg Oral Q6H   arformoterol  15 mcg Nebulization BID   And   umeclidinium bromide  1 puff Inhalation Daily   aspirin  325 mg Oral Daily   budesonide  0.5 mg Nebulization BID   cholecalciferol  2,000 Units Oral Daily   clopidogrel  37.5 mg Oral Daily   cyanocobalamin  1,000 mcg Oral Daily   enoxaparin (LOVENOX) injection  40 mg Subcutaneous P82U   folic acid  1 mg Oral Daily   guaiFENesin  1,200 mg Oral Daily   lactose free nutrition  237 mL Oral TID WC   loratadine  10 mg Oral Daily   methylPREDNISolone (SOLU-MEDROL) injection  40 mg Intravenous Q12H   montelukast  10 mg Oral QHS   multivitamin with minerals  1 tablet Oral Q breakfast   pantoprazole  40 mg Oral Daily   potassium chloride SA  20 mEq Oral QODAY   rosuvastatin  10 mg Oral Daily   tamoxifen  20 mg Oral Daily   traZODone  50 mg Oral QHS   Continuous Infusions:  azithromycin Stopped (06/01/22 0043)     LOS: 3 days     Time spent: 40 minutes    Irine Seal, MD [facility]   To contact the attending provider between 7A-7P or the covering provider during after hours 7P-7A, please log into the web site [email or web address] and access using universal [place] password for that web site. If you do not have the password, please call the hospital operator.  06/01/2022, 4:11 PM

## 2022-06-01 NOTE — Consult Note (Signed)
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  Patient being managed by hospitalist as well as PCCM.  Seeding management for acute on chronic respiratory failure, likely multifactorial.  CT imaging negative for PE however showed severe chronic lung disease with diffuse interstitial thickening, patchy airspace opacities which are progressed compared to prior CT scan from August; patient's oxygen requirements have worsened.  Palliative care consulted to assist with complex medical decision making.  Extensive review of EMR prior to presenting to bedside.  Presented to bedside and introduced myself to patient and my role as our member of the palliative care team.  No family was present at bedside during visit.  After discussing role of palliative care team, able to explore most recent medical updates patient has heard.  Patient was able to greatly detail her extensive past medical history.  Patient greatly appreciates all the continued care and support she receives especially from her outpatient oncologist Dr. Earlie Server.  Patient voiced knowing that she is getting palliative therapies for her metastatic cancer.  Patient is hoping to remain focused on the positive and enjoying the quality of life she does have.    Patient was able to describe her life at home.  Patient  married to husband Percell Miller.  They have been married for 17 years.  They were high school sweetheart's and then he broke up with her when he went off to college.  They had separate lives and children though remount years later when her husband was a widow and she was divorced and they got married a year later.  She really appreciates her life story which is wonderful.  Described that she has 2 daughters and patient has 2 daughters from their prior marriages.  She described her living situation is that she is currently a resident at an independent living facility.  Her husband takes complete care of her because he knows that she becomes very short of breath with any type of movement so she is minimally active at home though has a good quality of life enjoy things like knitting and time with family.  Patient stresses the quality of life is incredibly important to her.  Patient's mentation being able to interact with others around her is important.  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  She hopes to continue with appropriate medical therapies as long as they give her more quality time.  Patient described how with her health, she has been a Insurance account manager.  Did note patient has ACP documentation already in EMR from 2018.  She confirmed what the document states such as her healthcare power of attorney if she was unable to speak for herself would be her husband, Ryeleigh Santore.  Her primary alternate would be her daughter [name] and her secondary alternate would  be her daughter [name].  Her documentation grants discretion to her healthcare power of attorney if she is unable to make decisions for herself.  Patient did note that they had recently updated their documentation though she was unsure of what it said regarding medical terms (decision makers remained the same).  With note that she is a Insurance account manager,  inquired about such major events such as if her heart were to stop or she were to stop breathing because in her current documentation medical teams would provide aggressive medical support and likely if it were an emergency her husband wouldn't have have time to be called.  Took time to explain what full code versus DNR/DNI would mean.  Patient very quickly stated that her quality of life is incredibly important to her and she would not want to be kept alive on machines.  Patient planning to discuss this further with her husband today and inform providers if CODE STATUS should be updated to DNR/DNI with patient's severe lung disease knowing quality of life is more important to her than quantity of life.  Also inquired about use of opioids for dyspnea.  Patient has a history of addiction and notes she is 40 years "clean".  She currently takes tramadol though not daily.  Inquired if tramadol helped to relax her breathing at all since it is an opioid and patient noted she had not paid attention to this though she will from moving here on out.  Did discuss that should her dyspnea worsen, opioids can be appropriate interventions in the setting of quality of life to help manage dyspnea to allow 1oneto be able to do activities they enjoy doing.  Patient voiced hearing this and appreciated information.  Had lovely visit with the patient.  Recommended she follow-up with outpatient palliative provider at the cancer center since she already sees Dr. Earlie Server there.  Patient agreeing with this plan.  Noted PMT would would continue to follow along.  Patient voiced great appreciation for visit today.  Primary Diagnoses  Present on Admission:  Acute on chronic respiratory failure with hypoxia (HCC)  Adenocarcinoma of right lung, stage 4 (HCC)  COPD with acute exacerbation (HCC)  Essential hypertension  Gastroesophageal reflux disease  Hyperlipemia  Acute on chronic hypoxic respiratory failure ([place])   Palliative  Review of Systems: Patient denied any symptoms of concern at this time.  Past Medical History:  Diagnosis Date   Alcoholism in remission ([place]) 08/27/2007   Anemia    Arthritis    OA   Breast cancer ([place])    left breast   COPD 12/17/2006   Diverticulosis 03/27/2007   GERD 05/01/2007   HYPERGLYCEMIA 08/27/2007   HYPERLIPIDEMIA 12/17/2006   HYPERTENSION 12/17/2006   Mood disorder ([place])    hx anxiety and depression. meds short term during life transition   Pneumonia    AS A CHILD   Rectal polyp 03/27/2007   adenoma   SOB (shortness of breath) on exertion    per patient due to having COPD   TIA (transient ischemic attack)    Social History   Socioeconomic History   Marital status: Married    Spouse name: Not on file   Number of children: 2   Years of education: Not on file   Highest education level: Not on file  Occupational History   Occupation: retired  Tobacco Use   Smoking status: Former    Packs/day: 2.00    Years: 30.00    Total pack years: 60.00  Types: Cigarettes    Quit date: 07/10/1984    Years since quitting: 37.9   Smokeless tobacco: Never  Vaping Use   Vaping Use: Never used  Substance and Sexual Activity   Alcohol use: No    Alcohol/week: 0.0 standard drinks of alcohol    Comment: no alcoho since 1983   Drug use: No   Sexual activity: Not on file  Other Topics Concern   Not on file  Social History Narrative   Home Situation: lives with husband. 2 daughters, 2 step daughters, 6 grandkids. 2 greatgrandkids    Work or School: very active in [employer] and church      Spiritual Beliefs: Christian      Lifestyle:tries to walk; diet ok - denies any alcohol or tobacco use in many many years      Caffeine 1 cup coffee/day      Hobbies: news junkie, audio books      Left Handed      Lives in one story home   Social Determinants of Health   Financial Resource Strain: Low Risk  (08/12/2021)   Overall Financial Resource Strain (CARDIA)    Difficulty of Paying  Living Expenses: Not hard at all  Food Insecurity: No Food Insecurity (05/29/2022)   Hunger Vital Sign    Worried About Running Out of Food in the Last Year: Never true    [place] in the Last Year: Never true  Transportation Needs: No Transportation Needs (05/29/2022)   PRAPARE - Hydrologist (Medical): No    Lack of Transportation (Non-Medical): No  Physical Activity: Inactive (08/12/2021)   Exercise Vital Sign    Days of Exercise per Week: 0 days    Minutes of Exercise per Session: 0 min  Stress: No Stress Concern Present (08/12/2021)   [place]    Feeling of Stress : Not at all  Social Connections: Socially Integrated (08/12/2021)   Social Connection and Isolation Panel [NHANES]    Frequency of Communication with Friends and Family: More than three times a week    Frequency of Social Gatherings with Friends and Family: More than three times a week    Attends Religious Services: More than 4 times per year    Active Member of [employer] or Organizations: Yes    Attends Archivist Meetings: 1 to 4 times per year    Marital Status: Married   Family History  Problem Relation Age of Onset   Throat cancer Mother        1976   Heart attack Father 48   Idiopathic pulmonary fibrosis Brother 48   Cancer Brother        sarcoma   Heart disease Brother    Colon cancer Neg Hx    Esophageal cancer Neg Hx    Stomach cancer Neg Hx    Scheduled Meds:  acetaminophen  1,000 mg Oral Q6H   arformoterol  15 mcg Nebulization BID   And   umeclidinium bromide  1 puff Inhalation Daily   aspirin  325 mg Oral Daily   budesonide  0.5 mg Nebulization BID   cholecalciferol  2,000 Units Oral Daily   clopidogrel  37.5 mg Oral Daily   cyanocobalamin  1,000 mcg Oral Daily   enoxaparin (LOVENOX) injection  40 mg Subcutaneous V89F   folic acid  1 mg Oral Daily   guaiFENesin  1,200 mg Oral Daily  lactose free nutrition  237 mL Oral TID WC   loratadine  10 mg Oral Daily   methylPREDNISolone (SOLU-MEDROL) injection  40 mg Intravenous Q12H   montelukast  10 mg Oral QHS   multivitamin with minerals  1 tablet Oral Q breakfast   pantoprazole  40 mg Oral Daily   potassium chloride SA  20 mEq Oral QODAY   rosuvastatin  10 mg Oral Daily   tamoxifen  20 mg Oral Daily   traZODone  50 mg Oral QHS   Continuous Infusions:  azithromycin Stopped (06/01/22 0043)   magnesium sulfate bolus IVPB 2 g (06/01/22 1309)   PRN Meds:.albuterol, benzonatate, promethazine **AND** dextromethorphan, morphine injection, ondansetron **OR** ondansetron (ZOFRAN) IV, traMADol Allergies  Allergen Reactions   Alcohol Other (See Comments)    "Recovering Alcoholic*   Cheese Nausea And Vomiting   Dilaudid [Hydromorphone Hcl] Other (See Comments)    Pt had hypotension after dilaudid 1mg  IV while in the OR, required temporary pressor intervention.   Phenytoin Sodium Extended Other (See Comments)    Reaction not cited   Diclofenac Sodium Rash    Patient does not recall this as an allergy.    CBC:    Component Value Date/Time   WBC 3.8 (L) 06/01/2022 0458   HGB 7.9 (L) 06/01/2022 0458   HGB 9.7 (L) 05/24/2022 1036   HCT 24.8 (L) 06/01/2022 0458   PLT 107 (L) 06/01/2022 0458   PLT 237 05/24/2022 1036   MCV 111.7 (H) 06/01/2022 0458   NEUTROABS 3.0 06/01/2022 0458   LYMPHSABS 0.1 (L) 06/01/2022 0458   MONOABS 0.5 06/01/2022 0458   EOSABS 0.0 06/01/2022 0458   BASOSABS 0.0 06/01/2022 0458   Comprehensive Metabolic Panel:    Component Value Date/Time   NA 142 06/01/2022 0458   K 4.2 06/01/2022 0458   CL 106 06/01/2022 0458   CO2 32 06/01/2022 0458   BUN 19 06/01/2022 0458   CREATININE 0.73 06/01/2022 0458   CREATININE 0.74 05/24/2022 1036   CREATININE 0.76 05/24/2020 1414   GLUCOSE 152 (H) 06/01/2022 0458   CALCIUM 8.9 06/01/2022 0458   AST 23 05/30/2022 0503   AST 20 05/24/2022 1036   ALT 9  05/30/2022 0503   ALT 6 05/24/2022 1036   ALKPHOS 40 05/30/2022 0503   BILITOT 0.4 05/30/2022 0503   BILITOT 0.4 05/24/2022 1036   PROT 7.0 05/30/2022 0503   ALBUMIN 2.4 (L) 06/01/2022 0458    Physical Exam: Vital Signs: BP 103/67 (BP Location: Left Arm)   Pulse 92   Temp 97.6 F (36.4 C) (Oral)   Resp 18   Ht 5\' 5"  (1.651 m)   Wt 72.1 kg   SpO2 90%   BMI 26.45 kg/m  SpO2: SpO2: 90 % O2 Device: O2 Device: Nasal Cannula O2 Flow Rate: O2 Flow Rate (L/min): 11 L/min Intake/output summary:  Intake/Output Summary (Last 24 hours) at 06/01/2022 1401 Last data filed at 06/01/2022 0400 Gross per 24 hour  Intake 261.59 ml  Output 300 ml  Net -38.41 ml   LBM: Last BM Date : 05/30/22 Baseline Weight: Weight: 70.3 kg Most recent weight: Weight: 72.1 kg  General: NAD, alert, pleasant Eyes: No discharge noted HENT: normocephalic, atraumatic, moist mucous membranes Cardiovascular: RRR, no edema in LE b/l Respiratory: increased work of breathing noted with lengthy discussions, not in respiratory distress, remains on HFNC [place] with support Abdomen: not distended Extremities: Purposeful movements noted in all extremities Skin: no rashes or lesions on visible skin Neuro: A&Ox4,  following commands easily Psych: appropriately answers all questions  Palliative Performance Scale: 50%             Additional Data Reviewed: Recent Labs    05/30/22 0503 05/31/22 0502 06/01/22 0458  WBC 5.7 5.4 3.8*  HGB 8.6* 7.7* 7.9*  PLT 169 125* 107*  NA 140  --  142  BUN 17  --  19  CREATININE 0.58  --  0.73    Imaging: ECHOCARDIOGRAM COMPLETE    ECHOCARDIOGRAM REPORT       Patient Name:   EBONI COVAL Date of Exam: 05/31/2022 Medical Rec #:  [id number]     Height:       65.0 in Accession #:    [id number]    Weight:       158.3 lb Date of Birth:  February 07, 1935     BSA:          1.791 m Patient Age:    68 years      BP:           156/73 mmHg Patient Gender: F             HR:           82  bpm. Exam Location:  Inpatient  Procedure: 2D Echo  Indications:    pulmonary hypertension   History:        Patient has prior history of Echocardiogram examinations, most                 recent 06/29/2020. COPD; Risk Factors:Dyslipidemia and                 Hypertension.   Sonographer:    Harvie Junior Referring Phys: [place]   1. Left ventricular ejection fraction, by estimation, is 60 to 65%. The left ventricle has normal function. The left ventricle has no regional wall motion abnormalities. Left ventricular diastolic parameters were normal. There is the interventricular  septum is flattened in systole, consistent with right ventricular pressure overload.  2. Right ventricular systolic function is normal. The right ventricular size is moderately enlarged. There is normal pulmonary artery systolic pressure.  3. The mitral valve is grossly normal. No evidence of mitral valve regurgitation. No evidence of mitral stenosis.  4. Tricuspid valve regurgitation is mild to moderate.  5. The aortic valve is abnormal. Aortic valve regurgitation is not visualized. Mild aortic valve stenosis. Aortic valve mean gradient measures 14.0 mmHg. Normal LV Stroke volume and DVI.  6. The inferior vena cava is normal in size with greater than 50% respiratory variability, suggesting right atrial pressure of 3 mmHg.  Comparison(s): RV is dilated from prior.  FINDINGS  Left Ventricle: Left ventricular ejection fraction, by estimation, is 60 to 65%. The left ventricle has normal function. The left ventricle has no regional wall motion abnormalities. The left ventricular internal cavity size was small. There is no left  ventricular hypertrophy. The interventricular septum is flattened in systole, consistent with right ventricular pressure overload. Left ventricular diastolic parameters were normal.  Right Ventricle: The right ventricular size is moderately enlarged. No increase in  right ventricular wall thickness. Right ventricular systolic function is normal. There is normal pulmonary artery systolic pressure. The tricuspid regurgitant velocity is  2.78 m/s, and with an assumed right atrial pressure of 3 mmHg, the estimated right ventricular systolic pressure is 29.5 mmHg.  Left Atrium: Left atrial size was normal in size.  Right Atrium: Right atrial size  was normal in size.  Pericardium: There is no evidence of pericardial effusion.  Mitral Valve: The mitral valve is grossly normal. No evidence of mitral valve regurgitation. No evidence of mitral valve stenosis.  Tricuspid Valve: The tricuspid valve is normal in structure. Tricuspid valve regurgitation is mild to moderate. No evidence of tricuspid stenosis.  Aortic Valve: The aortic valve is abnormal. Aortic valve regurgitation is not visualized. Mild aortic stenosis is present. Aortic valve mean gradient measures 14.0 mmHg. Aortic valve peak gradient measures 24.4 mmHg. Aortic valve area, by VTI measures  2.10 cm.  Pulmonic Valve: The pulmonic valve was not well visualized. Pulmonic valve regurgitation is not visualized. No evidence of pulmonic stenosis.  Aorta: The aortic root is normal in size and structure.  Venous: The inferior vena cava is normal in size with greater than 50% respiratory variability, suggesting right atrial pressure of 3 mmHg.  IAS/Shunts: No atrial level shunt detected by color flow Doppler.    LEFT VENTRICLE PLAX 2D LVIDd:         4.00 cm     Diastology LVIDs:         2.60 cm     LV e' medial:    10.70 cm/s LV PW:         0.90 cm     LV E/e' medial:  7.3 LV IVS:        0.90 cm     LV e' lateral:   6.42 cm/s LVOT diam:     1.90 cm     LV E/e' lateral: 12.1 LV SV:         97 LV SV Index:   54 LVOT Area:     2.84 cm   LV Volumes (MOD) LV vol d, MOD A2C: 84.4 ml LV vol d, MOD A4C: 79.2 ml LV vol s, MOD A2C: 29.4 ml LV vol s, MOD A4C: 31.8 ml LV SV MOD A2C:     55.0 ml LV SV  MOD A4C:     79.2 ml LV SV MOD BP:      51.7 ml  RIGHT VENTRICLE RV Basal diam:  3.90 cm RV Mid diam:    4.40 cm TAPSE (M-mode): 1.6 cm  LEFT ATRIUM             Index        RIGHT ATRIUM           Index LA diam:        2.60 cm 1.45 cm/m   RA Area:     12.00 cm LA Vol (A2C):   34.8 ml 19.43 ml/m  RA Volume:   29.40 ml  16.42 ml/m LA Vol (A4C):   37.7 ml 21.05 ml/m LA Biplane Vol: 38.0 ml 21.22 ml/m  AORTIC VALVE                     PULMONIC VALVE AV Area (Vmax):    1.88 cm      PV Vmax:          1.18 m/s AV Area (Vmean):   1.74 cm      PV Peak grad:     5.6 mmHg AV Area (VTI):     2.10 cm      PR End Diast Vel: 14.29 msec AV Vmax:           247.00 cm/s AV Vmean:          166.000 cm/s AV VTI:  0.464 m AV Peak Grad:      24.4 mmHg AV Mean Grad:      14.0 mmHg LVOT Vmax:         164.05 cm/s LVOT Vmean:        102.050 cm/s LVOT VTI:          0.343 m LVOT/AV VTI ratio: 0.74   AORTA Ao Root diam: 3.00 cm  MITRAL VALVE                TRICUSPID VALVE MV Area (PHT): 3.48 cm     TR Peak grad:   30.9 mmHg MV Decel Time: 218 msec     TR Vmax:        278.00 cm/s MV E velocity: 77.60 cm/s MV A velocity: 112.00 cm/s  SHUNTS MV E/A ratio:  0.69         Systemic VTI:  0.34 m                             Systemic Diam: 1.90 cm  Rudean Haskell MD Electronically signed by Rudean Haskell MD Signature Date/Time: 05/31/2022/2:30:07 PM      Final     I personally reviewed recent imaging.   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  Patient being managed by hospitalist as well as PCCM.  Seeding management for acute on chronic respiratory failure, likely multifactorial.  CT imaging negative for PE however showed severe chronic lung disease with diffuse interstitial thickening, patchy airspace opacities which are progressed compared to prior CT scan from August; patient's oxygen requirements have worsened.  Palliative care consulted to assist with complex medical decision making.  # Complex medical decision making/goals of care  -Extensive conversation with patient as described above in HPI.  Patient willing to continue with appropriate medical interventions as long as it brings her quality time with family.    -Patient has ACP documentation on file from 2018.  She confirmed what the document states such as her healthcare power of attorney if she was unable to speak for herself would be her husband, Nahima Ales.  Her primary alternate would be her daughter [name] and her secondary alternate would be her daughter [name].  Her documentation grants discretion to her healthcare power of attorney if she is unable to make decisions for herself.    -Referral placed for outpatient palliative care follow-up at [facility] as patient follows with Dr. Julien Nordmann there.  -  Code Status: Full Code    -Discussed CODE STATUS in detail as described above in HPI.  Patient notes that she would likely not want to receive cardiac resuscitation and to be kept alive on machines though before she makes any change in CODE STATUS, would like to further discuss this with her husband.  Acknowledged and respected her wishes to further discuss this decision with family.  Patient noted quality of life is more important to her than quantity of life. To remain Full Code at this time.   # Symptom management  Dyspnea  Noted she has been in recovery for 40 years.  Reluctant to take any medications that she does not need.  Patient  takes tramadol as needed for pain.  Encourage patient to monitor for tramadol assist with dyspnea as it is an opioid which opioids can be first-line to assist with dyspnea management to help with improved quality of life.  Patient will continue to monitor and inform.  # Psycho-social/Spiritual Support:  - Support System: Husband, 2 daughters, 2 stepdaughters  # Discharge Planning: Likely to return to home setting, lives in independent living with full support from husband  Thank you for allowing the palliative care team to participate in the care [name].  Chelsea Aus, DO Palliative Care Provider PMT # [phone]  If patient remains symptomatic despite maximum doses, please call PMT at [phone] between 0700 and 1900. Outside of these hours, please call attending, as PMT does not have night coverage.  This provider spent a total of 83 minutes providing patient's care.  Includes review of EMR, discussing care with other staff members involved in patient's medical care, obtaining relevant history and information from patient and/or patient's family, and personal review of imaging and lab work. Greater than 50% of the time was spent counseling and coordinating care related to the above assessment and plan.

## 2022-06-01 NOTE — Plan of Care (Signed)
  Problem: Education: Goal: Knowledge of General Education information will improve Description Including pain rating scale, medication(s)/side effects and non-pharmacologic comfort measures Outcome: Progressing   Problem: Health Behavior/Discharge Planning: Goal: Ability to manage health-related needs will improve Outcome: Progressing   

## 2022-06-02 DIAGNOSIS — J9621 Acute and chronic respiratory failure with hypoxia: Secondary | ICD-10-CM | POA: Diagnosis not present

## 2022-06-02 DIAGNOSIS — Z79899 Other long term (current) drug therapy: Secondary | ICD-10-CM

## 2022-06-02 DIAGNOSIS — K219 Gastro-esophageal reflux disease without esophagitis: Secondary | ICD-10-CM | POA: Diagnosis not present

## 2022-06-02 DIAGNOSIS — J441 Chronic obstructive pulmonary disease with (acute) exacerbation: Secondary | ICD-10-CM | POA: Diagnosis not present

## 2022-06-02 DIAGNOSIS — Z66 Do not resuscitate: Secondary | ICD-10-CM

## 2022-06-02 DIAGNOSIS — C3491 Malignant neoplasm of unspecified part of right bronchus or lung: Secondary | ICD-10-CM | POA: Diagnosis not present

## 2022-06-02 DIAGNOSIS — R0600 Dyspnea, unspecified: Secondary | ICD-10-CM

## 2022-06-02 LAB — CBC WITH DIFFERENTIAL/PLATELET
Abs Immature Granulocytes: 0.06 10*3/uL (ref 0.00–0.07)
Basophils Absolute: 0 10*3/uL (ref 0.0–0.1)
Basophils Relative: 0 %
Eosinophils Absolute: 0 10*3/uL (ref 0.0–0.5)
Eosinophils Relative: 0 %
HCT: 25.6 % — ABNORMAL LOW (ref 36.0–46.0)
Hemoglobin: 8 g/dL — ABNORMAL LOW (ref 12.0–15.0)
Immature Granulocytes: 1 %
Lymphocytes Relative: 6 %
Lymphs Abs: 0.3 10*3/uL — ABNORMAL LOW (ref 0.7–4.0)
MCH: 34.8 pg — ABNORMAL HIGH (ref 26.0–34.0)
MCHC: 31.3 g/dL (ref 30.0–36.0)
MCV: 111.3 fL — ABNORMAL HIGH (ref 80.0–100.0)
Monocytes Absolute: 0.6 10*3/uL (ref 0.1–1.0)
Monocytes Relative: 14 %
Neutro Abs: 3.5 10*3/uL (ref 1.7–7.7)
Neutrophils Relative %: 79 %
Platelets: 117 10*3/uL — ABNORMAL LOW (ref 150–400)
RBC: 2.3 MIL/uL — ABNORMAL LOW (ref 3.87–5.11)
RDW: 14.6 % (ref 11.5–15.5)
WBC: 4.4 10*3/uL (ref 4.0–10.5)
nRBC: 0.5 % — ABNORMAL HIGH (ref 0.0–0.2)

## 2022-06-02 LAB — RENAL FUNCTION PANEL
Albumin: 2.4 g/dL — ABNORMAL LOW (ref 3.5–5.0)
Anion gap: 7 (ref 5–15)
BUN: 20 mg/dL (ref 8–23)
CO2: 35 mmol/L — ABNORMAL HIGH (ref 22–32)
Calcium: 8.9 mg/dL (ref 8.9–10.3)
Chloride: 99 mmol/L (ref 98–111)
Creatinine, Ser: 0.69 mg/dL (ref 0.44–1.00)
GFR, Estimated: >60 mL/min (ref >60)
Glucose, Bld: 162 mg/dL — ABNORMAL HIGH (ref 70–99)
Phosphorus: 4.1 mg/dL (ref 2.5–4.6)
Potassium: 4.3 mmol/L (ref 3.5–5.1)
Sodium: 141 mmol/L (ref 135–145)

## 2022-06-02 LAB — MAGNESIUM: Magnesium: 2 mg/dL (ref 1.7–2.4)

## 2022-06-02 MED ORDER — FUROSEMIDE 10 MG/ML IJ SOLN
40.0000 mg | Freq: Two times a day (BID) | INTRAMUSCULAR | Status: DC
  Administered 2022-06-02 – 2022-06-05 (×8): 40 mg via INTRAVENOUS
  Filled 2022-06-02 (×8): qty 4

## 2022-06-02 MED ORDER — TRAMADOL HCL 50 MG PO TABS
50.0000 mg | ORAL_TABLET | ORAL | Status: DC | PRN
  Administered 2022-06-02 – 2022-06-07 (×12): 50 mg via ORAL
  Filled 2022-06-02 (×12): qty 1

## 2022-06-02 NOTE — Progress Notes (Signed)
PROGRESS NOTE    Sheila Shields  FOY:[id number] DOB: [birth year] DOA: 05/29/2022 PCP: Marin Olp, MD    Chief Complaint  Patient presents with   Shortness of Breath    Brief Narrative:  Sheila Shields is a 87 y.o. female with PMH significant for stage IV adenocarcinoma of the lungs, history of left breast cancer, HTN, HLD, COPD on 4-5L oxygen at home, GERD, anxiety, osteoarthritis 11/20, patient presented to the ED from home with complaint of 4 days of worsening shortness of breath, wheezing, weakness, cough, cyanosis. EMS noted her O2 sat at 44% on 5 L.  Give her albuterol, Atrovent, IV Solu-Medrol.   In the ED, patient was afebrile, heart rate 113, blood pressure 91/57, respiratory in 20s and low 30s, required nonrebreather mask in the ED. Blood gas with pH 7.45, PCO2 44, bicarb 31, BNP 500, WBC count 7.8, hemoglobin 8.7 Respiratory virus panel negative for flu, COVID CT angio chest did not show any evidence of acute pulm embolism.  Showed severe chronic lung disease with diffuse interstitial thickening, patchy airspace opacities which have progressed compared to prior CT scan from August. Patient was given bronchodilators, IV Solu-Medrol, empiric IV antibiotics Admitted to hospitalist service   Assessment & Plan:   Principal Problem:   Acute on chronic respiratory failure with hypoxia ([place]) Active Problems:   Hyperlipemia   Essential hypertension   Gastroesophageal reflux disease   Adenocarcinoma of right lung, stage 4 (HCC)   COPD with acute exacerbation (HCC)   Acute on chronic hypoxic respiratory failure (HCC)   Malnutrition of moderate degree   COPD exacerbation (HCC)   Goals of care, counseling/discussion   Palliative care encounter   Coordination of complex care  #1 acute on chronic respiratory failure with hypoxemia/acute exacerbation of COPD -Patient noted chronically on home O2, 4 to 5 L oxygen in the setting of severe COPD and stage IV adenocarcinoma of  the lung. -Patient presented to the ED with worsening shortness of breath requiring nonrebreather in the ED, imaging showed severe chronic lung disease diffuse interstitial thickening, patchy airspace opacities unclear of acute component. -CT chest done negative for PE however showed severe chronic lung disease with diffuse interstitial thickening, patchy airspace opacities which are progressed compared to prior CT scan from August. -Patient with worsening O2 requirements currently on 11 L high flow nasal cannula. -Urine Legionella antigen negative, urine pneumococcus antigen negative. -Continue empiric IV azithromycin, IV Solu-Medrol twice daily, bronchodilators, Tessalon Perles, Mucinex. -IV Rocephin discontinued per PCCM. -Per Dr. Pietro Cassis goal O2 will be close to 85% as patient seems fairly comfortable with low O2 saturations. -Patient noted however with worsening progressive shortness of breath on minimal exertion. -VBG on admission with a pH of 7.45, pO2 of 53, bicarb of 30.6, O2 sats of 85.1%. -BNP noted elevated at 500.5 on admission, with repeat BNP of 637.6. -Patient received a dose of Lasix 40 mg IV x 1 05/31/2022 with a urine output of 850 cc over the past 24 hours. -Patient still with increased O2 requirements, pulmonary recommending another day of Lasix 40 mg IV twice daily, strict I's and O's, daily weights. -PCCM recommending changing CODE STATUS to DNR, weaning O2 to keep sats 88%, okay for desats as long as patient recovers in less than 5 minutes remains not in distress. -PCCM also recommended diuresis IV twice daily, azithromycin x 5 days, scheduled nebs and steroids, out of bed to chair, consideration of hospice on discharge and palliative care consultation. -Palliative care following. -  PCCM following and appreciate input and recommendations.  2.  Stage IV lung cancer -Being followed by Dr. Curt Bears in the outpatient setting. -Oncology informed of admission via  epic. -Outpatient follow-up.  3.  Hyperlipidemia -Statin.  4.  Hypertension -Patient noted not on antihypertensive medications prior to admission. -BP stable. -Monitor BP with diuresis.  5.  GERD -PPI.  6.  History of breast cancer -Continue tamoxifen.  7.  Chronic macrocytic anemia -Hemoglobin currently stable at 8.0. -Continue vitamin F35, folic acid supplementation. -Transfusion threshold hemoglobin , 7   DVT prophylaxis: Lovenox Code Status: Full Family Communication: Updated patient, family at bedside Disposition: TBD  Status is: Inpatient Remains inpatient appropriate because: Severity of illness   Consultants:  PCCM: Dr. Loanne Drilling 05/31/2022  Procedures:  CT angiogram chest 05/29/2022 Chest x-ray 05/29/2022 2D echo 05/31/2022   Antimicrobials:  IV azithromycin 05/29/2022>>>> IV Rocephin 05/29/2022>>>>> 06/01/2022   Subjective: Patient sitting up in chair, family at bedside as well as palliative care MD.  Patient states sitting still does not feel significantly short of breath and only gets short of breath on minimal exertion.  Overall feels well.  Still on 11 L high flow nasal cannula.  Objective: Vitals:   06/01/22 2308 06/02/22 0513 06/02/22 0610 06/02/22 0846  BP: (!) 146/78 (!) 146/67    Pulse: 74 72    Resp: 18 20    Temp: (!) 97.5 F (36.4 C) 97.9 F (36.6 C)    TempSrc: Oral Oral    SpO2: 100% (!) 89%  92%  Weight:   69 kg   Height:        Intake/Output Summary (Last 24 hours) at 06/02/2022 1152 Last data filed at 06/02/2022 0000 Gross per 24 hour  Intake 300 ml  Output 850 ml  Net -550 ml    Filed Weights   05/31/22 0830 06/01/22 0500 06/02/22 0610  Weight: 71.8 kg 72.1 kg 69 kg    Examination:  General exam: NAD Respiratory system: Decreased breath sounds in the bases.  Fine crackles noted R > L.  Fair air movement.  Speaking in full sentences.  On 11 L high flow nasal cannula.  Cardiovascular system: Regular rate and  rhythm no murmurs rubs or gallops.  No JVD.  No lower extremity edema.  Gastrointestinal system: Abdomen is soft, nontender, nondistended, positive bowel sounds.  No rebound.  No guarding.  Central nervous system: Alert and oriented. No focal neurological deficits. Extremities: Symmetric 5 x 5 power. Skin: No rashes, lesions or ulcers Psychiatry: Judgement and insight appear normal. Mood & affect appropriate.     Data Reviewed: I have personally reviewed following labs and imaging studies  CBC: Recent Labs  Lab 05/29/22 1220 05/30/22 0503 05/31/22 0502 06/01/22 0458 06/02/22 0442  WBC 7.8 5.7 5.4 3.8* 4.4  NEUTROABS  --   --  5.1 3.0 3.5  HGB 8.7* 8.6* 7.7* 7.9* 8.0*  HCT 27.3* 27.4* 24.6* 24.8* 25.6*  MCV 111.4* 110.9* 112.3* 111.7* 111.3*  PLT 206 169 125* 107* 117*     Basic Metabolic Panel: Recent Labs  Lab 05/29/22 1220 05/30/22 0503 06/01/22 0458 06/02/22 0442  NA 139 140 142 141  K 3.7 4.4 4.2 4.3  CL 102 106 106 99  CO2 27 27 32 35*  GLUCOSE 115* 130* 152* 162*  BUN 16 17 19 20   CREATININE 0.71 0.58 0.73 0.69  CALCIUM 8.6* 9.1 8.9 8.9  MG  --   --  1.7 2.0  PHOS  --   --  4.1 4.1     GFR: Estimated Creatinine Clearance: 50.2 mL/min (by C-G formula based on SCr of 0.69 mg/dL).  Liver Function Tests: Recent Labs  Lab 05/30/22 0503 06/01/22 0458 06/02/22 0442  AST 23  --   --   ALT 9  --   --   ALKPHOS 40  --   --   BILITOT 0.4  --   --   PROT 7.0  --   --   ALBUMIN 2.4* 2.4* 2.4*     CBG: No results for input(s): "GLUCAP" in the last 168 hours.   Recent Results (from the past 240 hour(s))  Resp Panel by RT-PCR (Flu A&B, Covid) Anterior Nasal Swab     Status: None   Collection Time: 05/29/22 12:28 PM   Specimen: Anterior Nasal Swab  Result Value Ref Range Status   SARS Coronavirus 2 by RT PCR NEGATIVE NEGATIVE Final    Comment: (NOTE) SARS-CoV-2 target nucleic acids are NOT DETECTED.  The SARS-CoV-2 RNA is generally detectable in  upper respiratory specimens during the acute phase of infection. The lowest concentration of SARS-CoV-2 viral copies this assay can detect is 138 copies/mL. A negative result does not preclude SARS-Cov-2 infection and should not be used as the sole basis for treatment or other patient management decisions. A negative result may occur with  improper specimen collection/handling, submission of specimen other than nasopharyngeal swab, presence of viral mutation(s) within the areas targeted by this assay, and inadequate number of viral copies(<138 copies/mL). A negative result must be combined with clinical observations, patient history, and epidemiological information. The expected result is Negative.  Fact Sheet for Patients:  EntrepreneurPulse.com.au  Fact Sheet for Healthcare Providers:  IncredibleEmployment.be  This test is no t yet approved or cleared by the [place] FDA and  has been authorized for detection and/or diagnosis of SARS-CoV-2 by FDA under an Emergency Use Authorization (EUA). This EUA will remain  in effect (meaning this test can be used) for the duration of the COVID-19 declaration under Section 564(b)(1) of the Act, 21 U.S.C.section 360bbb-3(b)(1), unless the authorization is terminated  or revoked sooner.       Influenza A by PCR NEGATIVE NEGATIVE Final   Influenza B by PCR NEGATIVE NEGATIVE Final    Comment: (NOTE) The Xpert Xpress SARS-CoV-2/FLU/RSV plus assay is intended as an aid in the diagnosis of influenza from Nasopharyngeal swab specimens and should not be used as a sole basis for treatment. Nasal washings and aspirates are unacceptable for Xpert Xpress SARS-CoV-2/FLU/RSV testing.  Fact Sheet for Patients: EntrepreneurPulse.com.au  Fact Sheet for Healthcare Providers: IncredibleEmployment.be  This test is not yet approved or cleared by the [place] FDA and has been  authorized for detection and/or diagnosis of SARS-CoV-2 by FDA under an Emergency Use Authorization (EUA). This EUA will remain in effect (meaning this test can be used) for the duration of the COVID-19 declaration under Section 564(b)(1) of the Act, 21 U.S.C. section 360bbb-3(b)(1), unless the authorization is terminated or revoked.  Performed at [facility], [place] [address], [place], [place] [address]   Respiratory (~20 pathogens) panel by PCR     Status: None   Collection Time: 05/31/22 12:05 PM   Specimen: Nasopharyngeal Swab; Respiratory  Result Value Ref Range Status   Adenovirus NOT DETECTED NOT DETECTED Final   Coronavirus 229E NOT DETECTED NOT DETECTED Final    Comment: (NOTE) The Coronavirus on the Respiratory Panel, DOES NOT test for the novel  Coronavirus (2019  nCoV)    Coronavirus HKU1 NOT DETECTED NOT DETECTED Final   Coronavirus NL63 NOT DETECTED NOT DETECTED Final   Coronavirus OC43 NOT DETECTED NOT DETECTED Final   Metapneumovirus NOT DETECTED NOT DETECTED Final   Rhinovirus / Enterovirus NOT DETECTED NOT DETECTED Final   Influenza A NOT DETECTED NOT DETECTED Final   Influenza B NOT DETECTED NOT DETECTED Final   Parainfluenza Virus 1 NOT DETECTED NOT DETECTED Final   Parainfluenza Virus 2 NOT DETECTED NOT DETECTED Final   Parainfluenza Virus 3 NOT DETECTED NOT DETECTED Final   Parainfluenza Virus 4 NOT DETECTED NOT DETECTED Final   Respiratory Syncytial Virus NOT DETECTED NOT DETECTED Final   Bordetella pertussis NOT DETECTED NOT DETECTED Final   Bordetella Parapertussis NOT DETECTED NOT DETECTED Final   Chlamydophila pneumoniae NOT DETECTED NOT DETECTED Final   Mycoplasma pneumoniae NOT DETECTED NOT DETECTED Final    Comment: Performed at [facility], [place] [address], [place], [place] [address]         Radiology Studies: ECHOCARDIOGRAM COMPLETE  Result Date: 05/31/2022    ECHOCARDIOGRAM REPORT   Patient Name:   DARIN REDMANN  Date of Exam: 05/31/2022 Medical Rec #:  [id number]     Height:       65.0 in Accession #:    [id number]    Weight:       158.3 lb Date of Birth:  1935-02-21     BSA:          1.791 m Patient Age:    51 years      BP:           156/73 mmHg Patient Gender: F             HR:           82 bpm. Exam Location:  Inpatient Procedure: 2D Echo Indications:    pulmonary hypertension  History:        Patient has prior history of Echocardiogram examinations, most                 recent 06/29/2020. COPD; Risk Factors:Dyslipidemia and                 Hypertension.  Sonographer:    Harvie Junior Referring Phys: [place]  1. Left ventricular ejection fraction, by estimation, is 60 to 65%. The left ventricle has normal function. The left ventricle has no regional wall motion abnormalities. Left ventricular diastolic parameters were normal. There is the interventricular septum is flattened in systole, consistent with right ventricular pressure overload.  2. Right ventricular systolic function is normal. The right ventricular size is moderately enlarged. There is normal pulmonary artery systolic pressure.  3. The mitral valve is grossly normal. No evidence of mitral valve regurgitation. No evidence of mitral stenosis.  4. Tricuspid valve regurgitation is mild to moderate.  5. The aortic valve is abnormal. Aortic valve regurgitation is not visualized. Mild aortic valve stenosis. Aortic valve mean gradient measures 14.0 mmHg. Normal LV Stroke volume and DVI.  6. The inferior vena cava is normal in size with greater than 50% respiratory variability, suggesting right atrial pressure of 3 mmHg. Comparison(s): RV is dilated from prior. FINDINGS  Left Ventricle: Left ventricular ejection fraction, by estimation, is 60 to 65%. The left ventricle has normal function. The left ventricle has no regional wall motion abnormalities. The left ventricular internal cavity size was small. There is no left ventricular  hypertrophy. The interventricular septum  is flattened in systole, consistent with right ventricular pressure overload. Left ventricular diastolic parameters were normal. Right Ventricle: The right ventricular size is moderately enlarged. No increase in right ventricular wall thickness. Right ventricular systolic function is normal. There is normal pulmonary artery systolic pressure. The tricuspid regurgitant velocity is 2.78 m/s, and with an assumed right atrial pressure of 3 mmHg, the estimated right ventricular systolic pressure is 93.8 mmHg. Left Atrium: Left atrial size was normal in size. Right Atrium: Right atrial size was normal in size. Pericardium: There is no evidence of pericardial effusion. Mitral Valve: The mitral valve is grossly normal. No evidence of mitral valve regurgitation. No evidence of mitral valve stenosis. Tricuspid Valve: The tricuspid valve is normal in structure. Tricuspid valve regurgitation is mild to moderate. No evidence of tricuspid stenosis. Aortic Valve: The aortic valve is abnormal. Aortic valve regurgitation is not visualized. Mild aortic stenosis is present. Aortic valve mean gradient measures 14.0 mmHg. Aortic valve peak gradient measures 24.4 mmHg. Aortic valve area, by VTI measures 2.10 cm. Pulmonic Valve: The pulmonic valve was not well visualized. Pulmonic valve regurgitation is not visualized. No evidence of pulmonic stenosis. Aorta: The aortic root is normal in size and structure. Venous: The inferior vena cava is normal in size with greater than 50% respiratory variability, suggesting right atrial pressure of 3 mmHg. IAS/Shunts: No atrial level shunt detected by color flow Doppler.  LEFT VENTRICLE PLAX 2D LVIDd:         4.00 cm     Diastology LVIDs:         2.60 cm     LV e' medial:    10.70 cm/s LV PW:         0.90 cm     LV E/e' medial:  7.3 LV IVS:        0.90 cm     LV e' lateral:   6.42 cm/s LVOT diam:     1.90 cm     LV E/e' lateral: 12.1 LV SV:         97 LV SV  Index:   54 LVOT Area:     2.84 cm  LV Volumes (MOD) LV vol d, MOD A2C: 84.4 ml LV vol d, MOD A4C: 79.2 ml LV vol s, MOD A2C: 29.4 ml LV vol s, MOD A4C: 31.8 ml LV SV MOD A2C:     55.0 ml LV SV MOD A4C:     79.2 ml LV SV MOD BP:      51.7 ml RIGHT VENTRICLE RV Basal diam:  3.90 cm RV Mid diam:    4.40 cm TAPSE (M-mode): 1.6 cm LEFT ATRIUM             Index        RIGHT ATRIUM           Index LA diam:        2.60 cm 1.45 cm/m   RA Area:     12.00 cm LA Vol (A2C):   34.8 ml 19.43 ml/m  RA Volume:   29.40 ml  16.42 ml/m LA Vol (A4C):   37.7 ml 21.05 ml/m LA Biplane Vol: 38.0 ml 21.22 ml/m  AORTIC VALVE                     PULMONIC VALVE AV Area (Vmax):    1.88 cm      PV Vmax:          1.18 m/s AV Area (Vmean):  1.74 cm      PV Peak grad:     5.6 mmHg AV Area (VTI):     2.10 cm      PR End Diast Vel: 14.29 msec AV Vmax:           247.00 cm/s AV Vmean:          166.000 cm/s AV VTI:            0.464 m AV Peak Grad:      24.4 mmHg AV Mean Grad:      14.0 mmHg LVOT Vmax:         164.05 cm/s LVOT Vmean:        102.050 cm/s LVOT VTI:          0.343 m LVOT/AV VTI ratio: 0.74  AORTA Ao Root diam: 3.00 cm MITRAL VALVE                TRICUSPID VALVE MV Area (PHT): 3.48 cm     TR Peak grad:   30.9 mmHg MV Decel Time: 218 msec     TR Vmax:        278.00 cm/s MV E velocity: 77.60 cm/s MV A velocity: 112.00 cm/s  SHUNTS MV E/A ratio:  0.69         Systemic VTI:  0.34 m                             Systemic Diam: 1.90 cm Rudean Haskell MD Electronically signed by Rudean Haskell MD Signature Date/Time: 05/31/2022/2:30:07 PM    Final         Scheduled Meds:  acetaminophen  1,000 mg Oral Q6H   arformoterol  15 mcg Nebulization BID   And   umeclidinium bromide  1 puff Inhalation Daily   aspirin  325 mg Oral Daily   budesonide  0.5 mg Nebulization BID   cholecalciferol  2,000 Units Oral Daily   clopidogrel  37.5 mg Oral Daily   cyanocobalamin  1,000 mcg Oral Daily   enoxaparin (LOVENOX) injection  40  mg Subcutaneous Z22Q   folic acid  1 mg Oral Daily   furosemide  40 mg Intravenous BID   guaiFENesin  1,200 mg Oral Daily   lactose free nutrition  237 mL Oral TID WC   loratadine  10 mg Oral Daily   methylPREDNISolone (SOLU-MEDROL) injection  40 mg Intravenous Q12H   montelukast  10 mg Oral QHS   multivitamin with minerals  1 tablet Oral Q breakfast   pantoprazole  40 mg Oral Daily   potassium chloride SA  20 mEq Oral QODAY   rosuvastatin  10 mg Oral Daily   tamoxifen  20 mg Oral Daily   traZODone  50 mg Oral QHS   Continuous Infusions:  azithromycin Stopped (06/01/22 2258)     LOS: 4 days    Time spent: 40 minutes    Irine Seal, MD [facility]   To contact the attending provider between 7A-7P or the covering provider during after hours 7P-7A, please log into the web site [email or web address] and access using universal [place] password for that web site. If you do not have the password, please call the hospital operator.  06/02/2022, 11:52 AM

## 2022-06-02 NOTE — Progress Notes (Signed)
Daily Progress Note   Patient Name: Sheila Shields       Date: 06/02/2022 DOB: 07-Apr-1935  Age: 87 y.o. MRN#: [id number] Attending Physician: Eugenie Filler, MD Primary Care Physician: Marin Olp, MD Admit Date: 05/29/2022 Length of Stay: 4 days  Reason for Consultation/Follow-up: Establishing goals of care and symptom management  Subjective:   CC: Patient sitting up in chair endorsing shortness of breath with movement.  Following up regarding complex medical decision making and symptom management.  Subjective: Informed by PCCM Dr. Loanne Drilling that she was able to meet with patient this morning to further discuss prognosis.  Patient has decided to update her CODE STATUS to DNR/DNI.  Patient requesting information about possible home with hospice.  Presented to bedside to check on patient today.  Patient sitting up in chair enjoying conversation with family.  Daughter's Sheila Shields and Sheila Shields present at bedside.  Patient's husband, Sheila Shields, also able to join in conversation a little later on.  Able to introduce myself as a member of the palliative care team.  Patient remembered speaking with me at length yesterday.  Inquired about patient's symptom burden today.  Patient stated she did pay attention to when she took the tramadol to see if it helped to improve her breathing, and she feels that it did help alleviate the symptoms of shortness of breath.  We discussed further about use of tramadol and opioids in general for use of dyspnea management.  Patient willing to increase frequency of tramadol as needed to allow for more appropriate dyspnea management to allow her quality of life.  Did discuss adverse effects to monitor for with tramadol and consideration that patient may need medication such as low-dose morphine or oxycodone moving forward instead of tramadol.  Patient voiced hearing this and noted she would consider it.  Asked patient to pay attention today to taking tramadol 30-45 minutes prior to  any major movements/transfers to see if it helps alleviate the shortness of breath associated with that not just at rest.  Patient noted she will monitor this today.  Patient did confirm that she discussed with Dr. Loanne Drilling DNR/DNI status.  Family all in agreement with this.  Family inquired further about hospice support so was able to discuss generalities of hospice support in general at home.  Did explain in general what could and could not be provided.  Based on family's inquiring about further assistance at home, also encouraged them to discuss with the independent living section of the facility she is at about hiring possible additional caregivers.  Encouraged family to reach out today to their facility to determine if they have a preferred hospice provider or list of caregivers available for higher.  Family to follow-up on this today.  Noted that once they determined their hospice agency if there is a preferred one with their facility, or if they do not they can get information about all the hospice is available for support in the area, then a hospice liaison could provide them with more particular details about what their hospice could provide.  Family inquiring about DME as well and noted that would also be a discussion for the hospice liaison about what and would not be provided.  Also discussed the level of oxygen patient is currently requiring and that hospice at home can only support a particular level (normally 15 L at home) though would recommend them discussing this with the hospice liaison as well as hospice would provide oxygen.  Spent time providing emotional support  through active listening.  Greatly enjoyed conversation and meeting with family today.  All questions answered at that time.  Noted this provider would continue to follow along especially to assist with symptom management.  Review of Systems  Objective:   Vital Signs:  BP (!) 146/67   Pulse 72   Temp 97.9 F (36.6 C)  (Oral)   Resp 20   Ht 5\' 5"  (1.651 m)   Wt 69 kg   SpO2 92%   BMI 25.31 kg/m   Physical Exam: General: NAD, alert, pleasant, sitting up in chair at bedside Eyes: No discharge noted HENT: normocephalic, atraumatic, moist mucous membranes Cardiovascular: RRR, no edema in LE b/l Respiratory: increased work of breathing noted with ongoing discussions, not in respiratory distress, remains on HFNC [place] with support Abdomen: not distended Extremities: Purposeful movements noted in all extremities Skin: no rashes or lesions on visible skin Neuro: A&Ox4, following commands easily Psych: appropriately answers all questions  Imaging: I personally reviewed recent imaging.   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  Patient being managed by hospitalist as well as PCCM.  Seeding management for acute on chronic respiratory failure, likely multifactorial.  CT imaging negative for PE however showed severe chronic lung disease with diffuse interstitial thickening, patchy airspace opacities which are progressed compared to prior CT scan from August; patient's oxygen requirements have worsened.  Palliative care consulted to assist with complex medical decision making  and symptom management.   Recommendations/Plan: # Complex medical decision making/goals of care:  - Patient agreeing that when she is appropriate for discharge, will wanting to return back to her home with hospice support.  Family also in agreement with this plan.  Continuing appropriate medical management at this time with hopes to optimize condition.  Discussed generalities  of hospice support.  Family planning to follow-up with patient's facility to determine if they work with the preferred hospice provider and to receive contact information regarding hiring help at home.                -Patient already has ACP documentation on file from 2018.  She confirmed what the document states such as her healthcare power of attorney if she was unable to speak for herself would be her husband, Sheila Shields.  Her primary alternate would be her daughter Sheila Shields and her secondary alternate would be her daughter Sheila Shields.    -  Code Status: DNR   -Appropriately updated to DNR/DNI after discussions with Dr. Loanne Drilling this morning.  # Symptom management:      Dyspnea                                        Patient does believe tramadol helps to alleviate her symptoms of shortness of breath.  Willing to increase availability of tramadol during the day.  Did counsel that as this progresses, patient may need medication other than tramadol such as instead using low-dose morphine or oxycodone. 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     -Increase tramadol frequency to 50 mg every 4 hours as needed.  # Psycho-social/Spiritual Support:  - Support System: Husband, 2 daughters, 2 stepdaughters  # Discharge Planning: home with hospice  Discussed with: patient, family, hospitalist, PCCM  Thank you for allowing the palliative care team to participate in the care Sheila Shields.  Chelsea Aus, DO Palliative Care Provider PMT # [phone]  If patient remains symptomatic despite maximum doses, please call PMT at [phone] between 0700 and 1900. Outside of these hours, please call attending, as PMT does not have night coverage.

## 2022-06-02 NOTE — TOC Progression Note (Addendum)
Transition of Care [facility]) - Progression Note    Patient Details  Name: Sheila Shields MRN: [id number] Date of Birth: [birth year]  Transition of Care [facility]) CM/SW Contact  Henrietta Dine, RN Phone Number: 06/02/2022, 2:27 PM  Clinical Narrative:    Spoke with pt and family in room regarding hospice; the pt says she previously spoke with Dr Vinetta Bergamo, [facility] and her plan is to return home with hospice ([facility]); the pt is from [place]; LVM for [employer], Intake at facility and Venia Carbon at [place] to discuss services; awaiting return calls.  -1450- spoke with Clementeen Hoof at [facility] of pt to return to [facility] with hospice; she will contact the pt/family for transfer of services; pt receives oxygen from [facility]; LVM for pt's husband Sheila Shields [phone]) to obtain info about home equipment; TOC will con't to follow.    Expected Discharge Plan: [place] Barriers to Discharge: No Barriers Identified  Expected Discharge Plan and Services Expected Discharge Plan: [facility]   Discharge Planning Services: CM Consult Post Acute Care Choice: Durable Medical Equipment, Home Health (Active home 02,rw,cane;HHPT-through [facility]) Living arrangements for the past 2 months: Single Family Home                                       Social Determinants of Health (SDOH) Interventions    Readmission Risk Interventions    05/30/2022   10:51 AM 04/18/2022   10:49 AM  Readmission Risk Prevention Plan  Transportation Screening Complete Complete  PCP or Specialist Appt within 3-5 Days Complete Complete  HRI or [place] Complete Complete  Social Work Consult for [place] Planning/Counseling Complete Complete  Palliative Care Screening Complete Complete  Medication Review [employer]) Complete Complete

## 2022-06-02 NOTE — Consult Note (Signed)
NAME:  Sheila Shields, MRN:  [id number], DOB:  September 09, 1934, LOS: 4 ADMISSION DATE:  05/29/2022, CONSULTATION DATE:  05/31/2022 REFERRING MD:  Dr. Grandville Silos, CHIEF COMPLAINT: Hypoxia.    History of Present Illness:  87 year old female with PMH COPD (follows Dr. Lamonte Sakai), Chronic Hypoxic Respiratory Failure on 4L [place] baseline, Stage IV adenocarcinoma of left lung with mets to bones (diagnosed May 2023) and H/O breast CA s/p left lumpectomy s/p 7 cycles of carboplatin/Alimta/Keytruda, currently remains on Alimta/Keytruda every 3 weeks, (follows Dr Julien Nordmann with oncology and Dr. Lisbeth Renshaw with Radiation Oncology). Completed Palliative radiation to right pelvis and thoracic spine 7/24.   11/20 patient presents to ER with reported progressive shortness of breath, cough, wheezing for last 4 days. On arrival oxygen saturation 44% on 5L [place]. CT PE negative for PE, noted pulmonary edema and severe chronic lung disease with diffuse interstitial thickening and patchy airspace opacities which have progressed from previous. Started on Rocephin/Azithromycin. Requiring 8-15L nasal cannula. Pulmonary consulted.   Pertinent  Medical History  COPD, Chronic Hypoxic Respiratory Failure, Stage IV adenocarcinoma of left lung with mets to bones (diagnosed May 2023) and H/O breast CA s/p left lumpectomy, HTN, HLD, GERD, Anxiety  Significant Hospital Events: Including procedures, antibiotic start and stop dates in addition to other pertinent events   11/20 > Presents to ED  11/22 Pulmonary consulted  11/23 Unchanged O2 requirements to 10-12L  Interim History / Subjective:  Unchanged O2 requirement with desaturations to 70s-80s. Discussed GOC and patient confirmed DNR status. Inquires about hospice options as she wishes to go home  Diuresing well  Objective   Blood pressure (!) 146/67, pulse 72, temperature 97.9 F (36.6 C), temperature source Oral, resp. rate 20, height 5\' 5"  (1.651 m), weight 69 kg, SpO2 92 %.         Intake/Output Summary (Last 24 hours) at 06/02/2022 0850 Last data filed at 06/02/2022 0000 Gross per 24 hour  Intake 300 ml  Output 850 ml  Net -550 ml   Filed Weights   05/31/22 0830 06/01/22 0500 06/02/22 0610  Weight: 71.8 kg 72.1 kg 69 kg   Physical Exam: General: Elderly, frail-appearing, pleasant and interactive, no acute distress HENT: [place], AT, OP clear, MMM Eyes: EOMI, no scleral icterus Respiratory: Bibasilar inspiratory crackles Cardiovascular: RRR, -M/R/G, no JVD GI: BS+, soft, nontender Extremities:-Edema,-tenderness Neuro: AAO x4, CNII-XII grossly intact Psych: Normal mood, normal affect  Resolved Hospital Problem list     Assessment & Plan:   Acute on chronic hypoxemic respiratory failure 2/2 pulmonary edema/pleural effusion +/- pneumonitis COPD exacerbation Severe emphysema  Chest imaging reviewed from 11/202/23 which is negative for PE but demonstrates increased septal thickening and ground glass opacities with patchy consolidation bilaterally, enlarged left pleural effusion, superimposed on severe centrilobular and paraseptal emphysema. BNP 637, procal neg and normal WBC. Echo reviewed with normal EF but interventricular septum flattening. Normal pulmonary artery systolic pressures.   Assessment: Chronically ill female with multiple co-morbidities as noted above with increased O2 requirement likely due to increased volume overload as evidenced on echocardiogram. No evidence of PH on echo. Would not aggressively pursue RHC as she would need to be euvolemic and diagnosis would unlikely change management. RVP 20 neg and strep pn neg.  Tolerating diuresis. Would continue to this address acute causes of her respiratory failure  Discussed GOC and patient wishes to be DNR and wants to go home with hospice at her facility if this is an option.  Plan: - Code status  changed to DNR. Primary team/CM updated request to discuss available hospice options - Wean  supplemental O2 for saturation goal 88. Ok for desaturation as long as she recovers in <5 minutes and remains not in distress.  - Continue IV diuresis BID - Continue azithro x 5 d - Continue scheduled nebs and steroids  - OOB to chair as tolerates  - Appreciate palliative care involvement  Best Practice (right click and "Reselect all SmartList Selections" daily)   Diet/type: Regular consistency (see orders) DVT prophylaxis: LMWH GI prophylaxis: N/A Lines: N/A Foley:  N/A Code Status:  full code Last date of multidisciplinary goals of care discussion Patient updated at bedside 11/23    Time Spent 50 minutes     Rodman Pickle, M.D. [facility] Pulmonary/Critical Care Medicine 06/02/2022 8:50 AM   See Amion for personal pager For hours between 7 PM to 7 AM, please call El[facility] for urgent questions

## 2022-06-02 NOTE — Progress Notes (Addendum)
Physical Therapy Treatment Patient Details Name: Sheila Shields MRN: [id number] DOB: 1934-08-11 Today's Date: 06/02/2022   History of Present Illness Sheila Shields is a 87 y.o. female with PMH significant for stage IV adenocarcinoma of the lungs, history of left breast cancer, HTN, HLD, brain aneurysm,COPD on 4-5L oxygen at home, GERD, anxiety, osteoarthritis  11/20, patient presented to the ED from home with complaint of 4 days of worsening shortness of breath, wheezing, weakness, cough, cyanosis.  EMS noted her O2 sat at 44% on 5 L.    PT Comments    Pt reports waiting on family to bring her breakfast this morning.  Pt very agreeable for assist OOB to recliner while waiting for family.  Pt able to state her saturations with drop with activity and that she takes a few minutes to recover.  Current plan is return to [facility] with hospice.     Recommendations for follow up therapy are one component of a multi-disciplinary discharge planning process, led by the attending physician.  Recommendations may be updated based on patient status, additional functional criteria and insurance authorization.  Follow Up Recommendations  Home health PT (if does not receive hospice)     Assistance Recommended at Discharge Intermittent Supervision/Assistance  Patient can return home with the following A little help with bathing/dressing/bathroom;Assist for transportation;Help with stairs or ramp for entrance   Equipment Recommendations  Other (comment) (transport chair)    Recommendations for Other Services       Precautions / Restrictions Precautions Precautions: Other (comment) Precaution Comments: quickly desats and requires 5-6 minutes to recover     Mobility  Bed Mobility Overal bed mobility: Modified Independent             General bed mobility comments: Pt on 11L HFNC and SPO2 89% at rest.    Transfers Overall transfer level: Needs assistance Equipment used: None Transfers: Sit  to/from Stand, Bed to chair/wheelchair/BSC Sit to Stand: Supervision   Step pivot transfers: Supervision       General transfer comment: fatigues quickly, 3/4 dyspnea, SPO2 dropped to 60% with transfer; pt cued to breathe in through nose and limit talking to recover (recovery takes at least 5-6 minutes per pt)    Ambulation/Gait                   Stairs             Wheelchair Mobility    Modified Rankin (Stroke Patients Only)       Balance                                            Cognition Arousal/Alertness: Awake/alert Behavior During Therapy: WFL for tasks assessed/performed Overall Cognitive Status: Within Functional Limits for tasks assessed                                          Exercises      General Comments        Pertinent Vitals/Pain Pain Assessment Pain Assessment: No/denies pain    Home Living                          Prior Function  PT Goals (current goals can now be found in the care plan section) Progress towards PT goals: Progressing toward goals    Frequency    Min 3X/week      PT Plan Current plan remains appropriate    Co-evaluation              AM-PAC PT "6 Clicks" Mobility   Outcome Measure  Help needed turning from your back to your side while in a flat bed without using bedrails?: None Help needed moving from lying on your back to sitting on the side of a flat bed without using bedrails?: None Help needed moving to and from a bed to a chair (including a wheelchair)?: A Little Help needed standing up from a chair using your arms (e.g., wheelchair or bedside chair)?: A Little Help needed to walk in hospital room?: Total Help needed climbing 3-5 steps with a railing? : Total 6 Click Score: 16    End of Session Equipment Utilized During Treatment: Oxygen Activity Tolerance: Treatment limited secondary to medical complications  (Comment) Patient left: in chair;with call bell/phone within reach;with chair alarm set   PT Visit Diagnosis: Difficulty in walking, not elsewhere classified (R26.2)     Time: 9163-8466 PT Time Calculation (min) (ACUTE ONLY): 11 min  Charges:  $Therapeutic Activity: 8-22 mins                    Jannette Spanner PT, DPT Physical Therapist Acute Rehabilitation Services Preferred contact method: Secure Chat Weekend Pager Only: [phone] Office: [phone]    Myrtis Hopping [place] 06/02/2022, 4:23 PM

## 2022-06-02 NOTE — Plan of Care (Signed)

## 2022-06-02 NOTE — Progress Notes (Addendum)
Hydrologist [facility]) Hospital Liaison: RN note    Notified by Transition of Care Manger of patient/family request for [facility] services at [facility] after discharge.   Writer spoke with Spouse Ed and daughter to initiate education related to hospice philosophy, services and team approach to care. Ed verbalized understanding of information given.   Per discussion, plan is for discharge to [facility] by [facility] once medically cleared.    Please send signed and completed DNR form home with patient/family. Patient will need prescriptions for discharge comfort medications once medically stable.      DME needs have been discussed, patient currently has the following equipment in the home:  Oxygen 5L through [employer].  Patient/family requests the following DME for delivery to the home: Oxygen concentrator up to 12L. [facility] equipment manager has been notified and will contact DME provider to arrange delivery to the home. Home address has been verified and is correct in the chart. Ed is the family member to contact to arrange time of delivery.     [facility] Referral Center aware of the above. Please notify [facility] when patient is ready to leave the unit at discharge. (Call [phone] or [phone] after 5pm.) [facility] information and contact numbers given to Ed.      Please call with any hospice related questions.     Thank you for this referral.     Clementeen Hoof, DNP, [facility] (listed on AMION under Hospice and [facility] [place]  [phone]

## 2022-06-03 ENCOUNTER — Inpatient Hospital Stay (HOSPITAL_COMMUNITY): Payer: Medicare HMO

## 2022-06-03 DIAGNOSIS — C3491 Malignant neoplasm of unspecified part of right bronchus or lung: Secondary | ICD-10-CM | POA: Diagnosis not present

## 2022-06-03 DIAGNOSIS — J441 Chronic obstructive pulmonary disease with (acute) exacerbation: Secondary | ICD-10-CM | POA: Diagnosis not present

## 2022-06-03 DIAGNOSIS — K219 Gastro-esophageal reflux disease without esophagitis: Secondary | ICD-10-CM | POA: Diagnosis not present

## 2022-06-03 DIAGNOSIS — J9621 Acute and chronic respiratory failure with hypoxia: Secondary | ICD-10-CM | POA: Diagnosis not present

## 2022-06-03 LAB — BASIC METABOLIC PANEL
Anion gap: 5 (ref 5–15)
BUN: 25 mg/dL — ABNORMAL HIGH (ref 8–23)
CO2: 38 mmol/L — ABNORMAL HIGH (ref 22–32)
Calcium: 8.9 mg/dL (ref 8.9–10.3)
Chloride: 97 mmol/L — ABNORMAL LOW (ref 98–111)
Creatinine, Ser: 0.83 mg/dL (ref 0.44–1.00)
GFR, Estimated: >60 mL/min (ref >60)
Glucose, Bld: 157 mg/dL — ABNORMAL HIGH (ref 70–99)
Potassium: 4.2 mmol/L (ref 3.5–5.1)
Sodium: 140 mmol/L (ref 135–145)

## 2022-06-03 LAB — CBC WITH DIFFERENTIAL/PLATELET
Abs Immature Granulocytes: 0.08 10*3/uL — ABNORMAL HIGH (ref 0.00–0.07)
Basophils Absolute: 0 10*3/uL (ref 0.0–0.1)
Basophils Relative: 0 %
Eosinophils Absolute: 0 10*3/uL (ref 0.0–0.5)
Eosinophils Relative: 0 %
HCT: 25.9 % — ABNORMAL LOW (ref 36.0–46.0)
Hemoglobin: 8.1 g/dL — ABNORMAL LOW (ref 12.0–15.0)
Immature Granulocytes: 2 %
Lymphocytes Relative: 4 %
Lymphs Abs: 0.2 10*3/uL — ABNORMAL LOW (ref 0.7–4.0)
MCH: 34.2 pg — ABNORMAL HIGH (ref 26.0–34.0)
MCHC: 31.3 g/dL (ref 30.0–36.0)
MCV: 109.3 fL — ABNORMAL HIGH (ref 80.0–100.0)
Monocytes Absolute: 0.8 10*3/uL (ref 0.1–1.0)
Monocytes Relative: 15 %
Neutro Abs: 4.1 10*3/uL (ref 1.7–7.7)
Neutrophils Relative %: 79 %
Platelets: 117 10*3/uL — ABNORMAL LOW (ref 150–400)
RBC: 2.37 MIL/uL — ABNORMAL LOW (ref 3.87–5.11)
RDW: 14.7 % (ref 11.5–15.5)
WBC: 5.2 10*3/uL (ref 4.0–10.5)
nRBC: 0.8 % — ABNORMAL HIGH (ref 0.0–0.2)

## 2022-06-03 MED ORDER — PREDNISONE 20 MG PO TABS: 40.0000 mg | ORAL_TABLET | Freq: Every day | ORAL | Status: DC

## 2022-06-03 MED ORDER — PREDNISONE 50 MG PO TABS: 50.0000 mg | ORAL_TABLET | Freq: Every day | ORAL | Status: DC

## 2022-06-03 MED ORDER — PREDNISONE 20 MG PO TABS
60.0000 mg | ORAL_TABLET | Freq: Every day | ORAL | Status: DC
  Administered 2022-06-03 – 2022-06-07 (×5): 60 mg via ORAL
  Filled 2022-06-03 (×5): qty 3

## 2022-06-03 NOTE — Consult Note (Signed)
NAME:  Sheila Shields, MRN:  [id number], DOB:  Jul 07, 1935, LOS: 5 ADMISSION DATE:  05/29/2022, CONSULTATION DATE:  05/31/2022 REFERRING MD:  Dr. Grandville Silos, CHIEF COMPLAINT: Hypoxia.    History of Present Illness:  87 year old female with PMH COPD (follows Dr. 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 Lisbeth Renshaw with Radiation Oncology). Completed Palliative radiation to right pelvis and thoracic spine 7/24.   11/20 patient presents to ER with reported progressive shortness of breath, cough, wheezing for last 4 days. On arrival oxygen saturation 44% on 5L [place]. CT PE negative for PE, noted pulmonary edema and severe chronic lung disease with diffuse interstitial thickening and patchy airspace opacities which have progressed from previous. Started on Rocephin/Azithromycin. Requiring 8-15L nasal cannula. Pulmonary consulted.   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 Patient is content with this decision but acknowledges this is difficult for her family especially her husband  Improved resting O2 sats to 99% however continues to have desaturations with talking and minor activity requiring 10-11L O2. Diuresing well  Objective   Blood pressure 135/62, pulse 70, temperature 97.7 F (36.5  C), temperature source Oral, resp. rate 16, height 5\' 5"  (1.651 m), weight 68.2 kg, SpO2 97 %.        Intake/Output Summary (Last 24 hours) at 06/03/2022 0909 Last data filed at 06/03/2022 0522 Gross per 24 hour  Intake 580 ml  Output 1100 ml  Net -520 ml   Filed Weights   06/01/22 0500 06/02/22 0610 06/03/22 0500  Weight: 72.1 kg 69 kg 68.2 kg   Physical Exam: General: Elderly, frail-appearing, pleasant and interactive, no acute distress, desaturation with speech HENT: [place], AT, OP clear, MMM, nasal cannula Eyes: EOMI, no scleral icterus Respiratory: Bibasilar crackles.  No wheezing Cardiovascular: RRR, -M/R/G, no JVD GI: BS+, soft, nontender Extremities:-Edema,-tenderness Neuro: AAO x4, CNII-XII grossly intact   Resolved Hospital Problem list     Assessment & Plan:   Acute on chronic hypoxemic respiratory failure 2/2 pulmonary edema/pleural effusion +/- pneumonitis COPD exacerbation Severe emphysema  Chest imaging reviewed from 11/202/23 which is negative for PE but demonstrates increased septal thickening and ground glass opacities with patchy consolidation bilaterally, enlarged left pleural effusion, superimposed on severe centrilobular and paraseptal emphysema. BNP 637, procal neg and normal WBC. Echo reviewed with normal EF but interventricular septum flattening. Normal pulmonary artery systolic pressures.    RVP 20 neg and strep pn neg.  Assessment: Chronically ill female with multiple co-morbidities as noted above with increased O2 requirement likely due to increased volume overload as evidenced on echocardiogram. No evidence of PH on echo. Would not aggressively pursue RHC as she would need to be euvolemic and diagnosis would unlikely change management. Pneumonitis potentially 2/2 chemotherapy remains on the differential as her oxygen needs have been unchanged despite diuresis.   Discussed GOC and  patient wishes to be DNR and wants to go home with hospice at her  facility. Primary team arranging  Plan: - Currently DNR with plan to discharge to hospice. Will CC her pulmonologist and Oncologist. She wishes to continue seeing her doctors. - Recommend up to 12L O2 at discharge. Can wean supplemental O2 for saturation goal 88. Ok for desaturation as long as she recovers in <5 minutes and remains not in distress.  - Continue IV diuresis while inpatient. Consider at discharge lasix 40 mg daily PRN for shortness of breath - Will send home with prednisone taper for pneumonitis. Starting at prednisone 60 mg, then decreasing by 10 mg each week until she is seen by Dr. Lamonte Sakai in Pulmonary clinic on 12/7 - Complete azithro x 5d total - Continue scheduled nebs. At discharge resume home Breztri - OOB to chair as tolerates  - Appreciate palliative care involvement  Pulmonary will intermittently follow  Best Practice (right click and "Reselect all SmartList Selections" daily)   Diet/type: Regular consistency (see orders) DVT prophylaxis: LMWH GI prophylaxis: N/A Lines: N/A Foley:  N/A Code Status:  full code Last date of multidisciplinary goals of care discussion Patient updated at bedside 11/23    Time Spent 35 minutes     Rodman Pickle, MD [place] 06/03/2022 9:09 AM   See Amion for personal pager For hours between 7 PM to 7 AM, please call Elink for urgent questions

## 2022-06-03 NOTE — Progress Notes (Addendum)
Daily Progress Note   Patient Name: Sheila Shields       Date: 06/03/2022 DOB: 05/09/1935  Age: 87 y.o. MRN#: [id number] Attending Physician: Sheila Filler, MD Primary Care Physician: Sheila Olp, MD Admit Date: 05/29/2022 Length of Stay: 5 days  Reason for Consultation/Follow-up: Establishing goals of care and symptom management  Subjective:   CC: Patient sitting up in chair endorsing shortness of breath with movement.  Following up regarding complex medical decision making and symptom management.  Subjective:  As per EMR review prior to seeing patient, patient required tramadol 50 mg x 2 doses over the past 24 hours to assist with dyspnea management.  Hospice liaison has been following to assist with coordination of discharge to patient's facility when appropriate for discharge.  Presented to bedside to check on patient this morning.  Patient seen laying in bed.  No family present at bedside.  She noted her daughters were on the way after going to [place]. Inquired about planning for today and patient is awaiting to hear from hospitalist when she will be able to get discharged.  She noted that Dr. Loanne Shields stop by this morning and said from her perspective interventions are being followed and Dr. Loanne Shields would follow-up tomorrow if patient was still here.  Patient has heard that hospice has been coordinated so that she can have their support when she is discharged.  Inquired about patient's symptom burden at this time.  Patient denies any worsening symptoms of shortness of breath.  Patient did feel that taking the tramadol to assist with shortness of breath management has helped her.  Encouraged the patient could take tramadol every 4 hours as needed especially if she knew she was going to be moving around a lot such as when she is discharged back to her home.  Encouraged continued discussions with hospice about further medication adjustments and titrations based on her  symptom burden.  Patient did have "coughing fit" while this provider was present in room though this is not abnormal for the patient.  Required increase in oxygen support.  X-ray tech also came by room to obtain chest x-ray which patient was agreeing with at this time.  Noted with movement patient's oxygen can decrease so continued oxygen support noted would ask RN to provide tramadol to assist with management.  Patient agreeing with this plan.  All questions answered at that time.  Noted would reach out to promotions hospitalist to determine when he will determine patient is appropriate for discharge.  Patient voiced appreciation for visit today.  Review of Systems Symptom of dyspnea at baseline and improved with tramadol as needed. Objective:   Vital Signs:  BP 135/62 (BP Location: Right Arm)   Pulse 70   Temp 97.7 F (36.5 C) (Oral)   Resp 16   Ht 5\' 5"  (1.651 m)   Wt 68.2 kg   SpO2 97%   BMI 25.02 kg/m   Physical Exam: General: NAD, alert, pleasant, laying in bed Eyes: No discharge noted HENT: normocephalic, atraumatic, moist mucous membranes Cardiovascular: RRR, no edema in LE b/l Respiratory: increased work of breathing noted with ongoing discussions, not in respiratory distress, remains on HFNC Sheila Shields with support Abdomen: not distended Extremities: Purposeful movements noted in all extremities Skin: no rashes or lesions on visible skin Neuro: A&Ox4, following commands easily Psych: appropriately answers all questions  Imaging: I personally reviewed recent imaging.   Assessment & Plan:   Assessment: Patient is an 87 year old female with past medical  history of COPD, chronic hypoxic respiratory failure on 4 L nasal cannula baseline, stage IV adenocarcinoma of left lung with mets to bone (diagnosed 11/2021), brain aneurysm, and history of breast cancer status post left lumpectomy who was admitted on 05/29/2022 for management of worsening shortness of breath, cough, and  wheezing.  Patient being managed by hospitalist as well as PCCM.  Seeding management for acute on chronic respiratory failure, likely multifactorial.  CT imaging negative for PE however showed severe chronic lung disease with diffuse interstitial thickening, patchy airspace opacities which are progressed compared to prior CT scan from August; patient's oxygen requirements have worsened.  Palliative care consulted to assist with complex medical decision making  and symptom management.   Recommendations/Plan: # Complex medical decision making/goals of care:  - Patient wanting to return to her home with hospice support when determined appropriate for discharge per hospitalist.                -Patient already has ACP documentation on file from 2018.  She confirmed what the document states such as her healthcare power of attorney if she was unable to speak for herself would be her husband, Sheila Shields.  Her primary alternate would be her daughter Sheila Shields and her secondary alternate would be her daughter Sheila Shields.    -  Code Status: DNR    # Symptom management:      Dyspnea                                        Sheila Shields patient on use of opioids for dyspnea management.    -Continue tramadol frequency to 50 mg every 4 hours as needed.  # Psycho-social/Spiritual Support:  - Support System: Husband, 2 daughters, 2 stepdaughters  # Discharge Planning: home with hospice  Discussed with: patient, family, hospitalist, PCCM  Thank you for allowing the palliative care team to participate in the care Sheila Shields.  Sheila Aus, DO Palliative Care Provider PMT # [phone]  If patient remains symptomatic despite maximum doses, please call PMT at [phone] between 0700 and 1900. Outside of these hours, please call attending, as PMT does not have night coverage.  This provider spent a total of 35 minutes providing patient's care.  Includes review of EMR, discussing care with  other staff members involved in patient's medical care, obtaining relevant history and information from patient and/or patient's family, and personal review of imaging and lab work. Greater than 50% of the time was spent counseling and coordinating care related to the above assessment and plan.

## 2022-06-03 NOTE — Progress Notes (Signed)
PROGRESS NOTE    [name]  ZMO:[id number] DOB: 08/05/1934 DOA: 05/29/2022 PCP: Marin Olp, MD    Chief Complaint  Patient presents with   Shortness of Breath    Brief Narrative:  Sheila Shields is a 87 y.o. female with PMH significant for stage IV adenocarcinoma of the lungs, history of left breast cancer, HTN, HLD, COPD on 4-5L oxygen at home, GERD, anxiety, osteoarthritis 11/20, patient presented to the ED from home with complaint of 4 days of worsening shortness of breath, wheezing, weakness, cough, cyanosis. EMS noted her O2 sat at 44% on 5 L.  Give her albuterol, Atrovent, IV Solu-Medrol.   In the ED, patient was afebrile, heart rate 113, blood pressure 91/57, respiratory in 20s and low 30s, required nonrebreather mask in the ED. Blood gas with pH 7.45, PCO2 44, bicarb 31, BNP 500, WBC count 7.8, hemoglobin 8.7 Respiratory virus panel negative for flu, COVID CT angio chest did not show any evidence of acute pulm embolism.  Showed severe chronic lung disease with diffuse interstitial thickening, patchy airspace opacities which have progressed compared to prior CT scan from August. Patient was given bronchodilators, IV Solu-Medrol, empiric IV antibiotics Admitted to hospitalist service   Assessment & Plan:   Principal Problem:   Acute on chronic respiratory failure with hypoxia ([place]) Active Problems:   Hyperlipemia   Essential hypertension   Gastroesophageal reflux disease   Adenocarcinoma of right lung, stage 4 (HCC)   COPD with acute exacerbation (HCC)   Acute on chronic hypoxic respiratory failure (HCC)   Malnutrition of moderate degree   COPD exacerbation (HCC)   Goals of care, counseling/discussion   Palliative care encounter   Coordination of complex care   High risk medication use   DNR (do not resuscitate)   Dyspnea  #1 acute on chronic respiratory failure with hypoxemia/acute exacerbation of COPD -Patient noted chronically on home O2, 4 to 5 L  oxygen in the setting of severe COPD and stage IV adenocarcinoma of the lung. -Patient presented to the ED with worsening shortness of breath requiring nonrebreather in the ED, imaging showed severe chronic lung disease diffuse interstitial thickening, patchy airspace opacities unclear of acute component. -CT chest done negative for PE however showed severe chronic lung disease with diffuse interstitial thickening, patchy airspace opacities which are progressed compared to prior CT scan from August. -Patient with worsening O2 requirements currently on 11 L high flow nasal cannula. -Urine Legionella antigen negative, urine pneumococcus antigen negative. -Continue empiric IV azithromycin, IV Solu-Medrol twice daily, bronchodilators, Tessalon Perles, Mucinex. -IV Rocephin discontinued per PCCM. -Per Dr. Pietro Cassis goal O2 will be close to 85% as patient seems fairly comfortable with low O2 saturations. -Patient noted however with worsening progressive shortness of breath on minimal exertion. -VBG on admission with a pH of 7.45, pO2 of 53, bicarb of 30.6, O2 sats of 85.1%. -BNP noted elevated at 500.5 on admission, with repeat BNP of 637.6. -Patient received a dose of Lasix 40 mg IV x 1 05/31/2022 with a urine output of 850 cc. -Currently on Lasix 40 mg IV every 12 hours urine output of 1.1 L over the past 24 hours.   -Patient still with increased O2 requirements, pulmonary recommending continuation of of Lasix 40 mg IV twice daily, strict I's and O's, daily weights. -PCCM recommending changing CODE STATUS to DNR, weaning O2 to keep sats 88%, okay for desats as long as patient recovers in less than 5 minutes remains not in distress. -PCCM  also recommended diuresis IV twice daily, azithromycin x 5 days, scheduled nebs and steroids, out of bed to chair, consideration of hospice on discharge and palliative care consultation. -Once patient with no clinical/significant improvement with hypoxia despite current  medical treatment could likely subsequently transfer back to facility with hospice following. -Patient seems to be in agreement with hospice on discharge and has met with hospice liaison. -Palliative care following. -PCCM following and appreciate input and recommendations.  2.  Stage IV lung cancer -Being followed by Dr. Curt Bears in the outpatient setting. -Oncology informed of admission via epic. -Palliative care consulted and following. -Outpatient follow-up.  3.  Hyperlipidemia -Statin.  4.  Hypertension -Patient noted not on antihypertensive medications prior to admission. -BP stable. -Monitor BP with diuresis.  5.  GERD -Continue PPI.   6.  History of breast cancer -Tamoxifen.  7.  Chronic macrocytic anemia -Hemoglobin currently stable at 8.1. -Continue vitamin Q46, folic acid supplementation. -Transfusion threshold hemoglobin , 7   DVT prophylaxis: Lovenox Code Status: Full Family Communication: Updated patient, family at bedside Disposition: Likely back to facility with hospice following once clinically and medically stable hopefully on Monday, 06/05/2022  Status is: Inpatient Remains inpatient appropriate because: Severity of illness   Consultants:  PCCM: Dr. Loanne Drilling 05/31/2022 Palliative care: Dr. Vinetta Bergamo 06/01/2022  Procedures:  CT angiogram chest 05/29/2022 Chest x-ray 05/29/2022 2D echo 05/31/2022   Antimicrobials:  IV azithromycin 05/29/2022>>>> IV Rocephin 05/29/2022>>>>> 06/01/2022   Subjective: Sitting up in bed.  Does not feel she is worsening.  Denies any significant shortness of breath at rest.  Still short of breath on minimal exertion.  Still requiring about 11 L high flow nasal cannula.  Family at bedside.    Objective: Vitals:   06/02/22 2029 06/03/22 0500 06/03/22 0521 06/03/22 0818  BP:   135/62   Pulse:   70   Resp:   16   Temp:   97.7 F (36.5 C)   TempSrc:   Oral   SpO2: (!) 83%  95% 97%  Weight:  68.2 kg    Height:         Intake/Output Summary (Last 24 hours) at 06/03/2022 1251 Last data filed at 06/03/2022 0522 Gross per 24 hour  Intake 340 ml  Output 1100 ml  Net -760 ml    Filed Weights   06/01/22 0500 06/02/22 0610 06/03/22 0500  Weight: 72.1 kg 69 kg 68.2 kg    Examination:  General exam: NAD Respiratory system: Fine crackles noted right greater than left.  Decreased breath sounds in the bases.  No wheezing.  On 11 L high flow nasal cannula. Cardiovascular system: RRR no murmurs rubs or gallops.  No JVD.  No lower extremity edema.  Gastrointestinal system: Abdomen is soft, nontender, nondistended, positive bowel sounds.  No rebound.  No guarding. Central nervous system: Alert and oriented. No focal neurological deficits. Extremities: Symmetric 5 x 5 power. Skin: No rashes, lesions or ulcers Psychiatry: Judgement and insight appear normal. Mood & affect appropriate.     Data Reviewed: I have personally reviewed following labs and imaging studies  CBC: Recent Labs  Lab 05/30/22 0503 05/31/22 0502 06/01/22 0458 06/02/22 0442 06/03/22 0531  WBC 5.7 5.4 3.8* 4.4 5.2  NEUTROABS  --  5.1 3.0 3.5 4.1  HGB 8.6* 7.7* 7.9* 8.0* 8.1*  HCT 27.4* 24.6* 24.8* 25.6* 25.9*  MCV 110.9* 112.3* 111.7* 111.3* 109.3*  PLT 169 125* 107* 117* 117*     Basic Metabolic Panel: Recent Labs  Lab 05/29/22 1220 05/30/22 0503 06/01/22 0458 06/02/22 0442 06/03/22 0531  NA 139 140 142 141 140  K 3.7 4.4 4.2 4.3 4.2  CL 102 106 106 99 97*  CO2 27 27 32 35* 38*  GLUCOSE 115* 130* 152* 162* 157*  BUN _0 25*  CREATININE 0.71 0.58 0.73 0.69 0.83  CALCIUM 8.6* 9.1 8.9 8.9 8.9  MG  --   --  1.7 2.0  --   PHOS  --   --  4.1 4.1  --      GFR: Estimated Creatinine Clearance: 44.6 mL/min (by C-G formula based on SCr of 0.83 mg/dL).  Liver Function Tests: Recent Labs  Lab 05/30/22 0503 06/01/22 0458 06/02/22 0442  AST 23  --   --   ALT 9  --   --   ALKPHOS 40  --   --   BILITOT  0.4  --   --   PROT 7.0  --   --   ALBUMIN 2.4* 2.4* 2.4*     CBG: No results for input(s): "GLUCAP" in the last 168 hours.   Recent Results (from the past 240 hour(s))  Resp Panel by RT-PCR (Flu A&B, Covid) Anterior Nasal Swab     Status: None   Collection Time: 05/29/22 12:28 PM   Specimen: Anterior Nasal Swab  Result Value Ref Range Status   SARS Coronavirus 2 by RT PCR NEGATIVE NEGATIVE Final    Comment: (NOTE) SARS-CoV-2 target nucleic acids are NOT DETECTED.  The SARS-CoV-2 RNA is generally detectable in upper respiratory specimens during the acute phase of infection. The lowest concentration of SARS-CoV-2 viral copies this assay can detect is 138 copies/mL. A negative result does not preclude SARS-Cov-2 infection and should not be used as the sole basis for treatment or other patient management decisions. A negative result may occur with  improper specimen collection/handling, submission of specimen other than nasopharyngeal swab, presence of viral mutation(s) within the areas targeted by this assay, and inadequate number of viral copies(<138 copies/mL). A negative result must be combined with clinical observations, patient history, and epidemiological information. The expected result is Negative.  Fact Sheet for Patients:  EntrepreneurPulse.com.au  Fact Sheet for Healthcare Providers:  IncredibleEmployment.be  This test is no t yet approved or cleared by the [place] FDA and  has been authorized for detection and/or diagnosis of SARS-CoV-2 by FDA under an Emergency Use Authorization (EUA). This EUA will remain  in effect (meaning this test can be used) for the duration of the COVID-19 declaration under Section 564(b)(1) of the Act, 21 U.S.C.section 360bbb-3(b)(1), unless the authorization is terminated  or revoked sooner.       Influenza A by PCR NEGATIVE NEGATIVE Final   Influenza B by PCR NEGATIVE NEGATIVE Final     Comment: (NOTE) The Xpert Xpress SARS-CoV-2/FLU/RSV plus assay is intended as an aid in the diagnosis of influenza from Nasopharyngeal swab specimens and should not be used as a sole basis for treatment. Nasal washings and aspirates are unacceptable for Xpert Xpress SARS-CoV-2/FLU/RSV testing.  Fact Sheet for Patients: EntrepreneurPulse.com.au  Fact Sheet for Healthcare Providers: IncredibleEmployment.be  This test is not yet approved or cleared by the [place] FDA and has been authorized for detection and/or diagnosis of SARS-CoV-2 by FDA under an Emergency Use Authorization (EUA). This EUA will remain in effect (meaning this test can be used) for the duration of the COVID-19 declaration under Section 564(b)(1) of the Act, 21 U.S.C. section 360bbb-3(b)(1),  unless the authorization is terminated or revoked.  Performed at [facility], [place] [address] [place], [place] [address]   Respiratory (~20 pathogens) panel by PCR     Status: None   Collection Time: 05/31/22 12:05 PM   Specimen: Nasopharyngeal Swab; Respiratory  Result Value Ref Range Status   Adenovirus NOT DETECTED NOT DETECTED Final   Coronavirus 229E NOT DETECTED NOT DETECTED Final    Comment: (NOTE) The Coronavirus on the Respiratory Panel, DOES NOT test for the novel  Coronavirus (2019 nCoV)    Coronavirus HKU1 NOT DETECTED NOT DETECTED Final   Coronavirus NL63 NOT DETECTED NOT DETECTED Final   Coronavirus OC43 NOT DETECTED NOT DETECTED Final   Metapneumovirus NOT DETECTED NOT DETECTED Final   Rhinovirus / Enterovirus NOT DETECTED NOT DETECTED Final   Influenza A NOT DETECTED NOT DETECTED Final   Influenza B NOT DETECTED NOT DETECTED Final   Parainfluenza Virus 1 NOT DETECTED NOT DETECTED Final   Parainfluenza Virus 2 NOT DETECTED NOT DETECTED Final   Parainfluenza Virus 3 NOT DETECTED NOT DETECTED Final   Parainfluenza Virus 4 NOT DETECTED NOT DETECTED  Final   Respiratory Syncytial Virus NOT DETECTED NOT DETECTED Final   Bordetella pertussis NOT DETECTED NOT DETECTED Final   Bordetella Parapertussis NOT DETECTED NOT DETECTED Final   Chlamydophila pneumoniae NOT DETECTED NOT DETECTED Final   Mycoplasma pneumoniae NOT DETECTED NOT DETECTED Final    Comment: Performed at [facility], [place]. [address], [place], [place] [address]         Radiology Studies: DG CHEST PORT 1 VIEW  Result Date: 06/03/2022 CLINICAL DATA:  Worsening hypoxia. EXAM: PORTABLE CHEST 1 VIEW COMPARISON:  May 29, 2022 chest x-ray. May 29, 2022 CT scan of the chest. FINDINGS: The cardiomediastinal silhouette is stable. No pneumothorax. Diffuse opacities throughout the lungs are stable with a chronic interstitial component. More focal infiltrate in the left mid lower lung is stable in the interval as well. No other interval changes. IMPRESSION: No interval changes in diffuse interstitial opacities and more focal infiltrate in the left mid and lower lung. Electronically Signed   By: Dorise Bullion III M.D.   On: 06/03/2022 12:04        Scheduled Meds:  acetaminophen  1,000 mg Oral Q6H   arformoterol  15 mcg Nebulization BID   And   umeclidinium bromide  1 puff Inhalation Daily   aspirin  325 mg Oral Daily   budesonide  0.5 mg Nebulization BID   cholecalciferol  2,000 Units Oral Daily   clopidogrel  37.5 mg Oral Daily   cyanocobalamin  1,000 mcg Oral Daily   enoxaparin (LOVENOX) injection  40 mg Subcutaneous X52W   folic acid  1 mg Oral Daily   furosemide  40 mg Intravenous BID   guaiFENesin  1,200 mg Oral Daily   lactose free nutrition  237 mL Oral TID WC   loratadine  10 mg Oral Daily   montelukast  10 mg Oral QHS   multivitamin with minerals  1 tablet Oral Q breakfast   pantoprazole  40 mg Oral Daily   potassium chloride SA  20 mEq Oral QODAY   predniSONE  60 mg Oral Q breakfast   Followed by   Derrill Memo ON 06/10/2022] predniSONE  50 mg Oral Q  breakfast   Followed by   Derrill Memo ON 06/17/2022] predniSONE  40 mg Oral Q breakfast   rosuvastatin  10 mg Oral Daily   tamoxifen  20  mg Oral Daily   traZODone  50 mg Oral QHS   Continuous Infusions:  azithromycin 500 mg (06/02/22 2203)     LOS: 5 days    Time spent: 40 minutes    Irine Seal, MD [facility]   To contact the attending provider between 7A-7P or the covering provider during after hours 7P-7A, please log into the web site [email or web address] and access using universal [place] password for that web site. If you do not have the password, please call the hospital operator.  06/03/2022, 12:51 PM

## 2022-06-03 NOTE — Plan of Care (Signed)
  Problem: Education: Goal: Knowledge of General Education information will improve Description: Including pain rating scale, medication(s)/side effects and non-pharmacologic comfort measures Outcome: Progressing   Problem: Safety: Goal: Ability to remain free from injury will improve Outcome: Progressing   

## 2022-06-03 NOTE — Progress Notes (Signed)
[facility]) Hospital Liaison Note  Spoke with Mr. Bentsen by phone. He reports that oxygen concentrator was delivered to the home. Mr. Lueth share that he anticipates hospital discharge will likely not occur before Monday.  [facility] hospital liaison will continue to follow for discharge disposition.  Please call with any hospice related questions or concerns.  Thank you, Margaretmary Eddy, BSN, RN [facility] Liaison [phone]

## 2022-06-04 DIAGNOSIS — J441 Chronic obstructive pulmonary disease with (acute) exacerbation: Secondary | ICD-10-CM | POA: Diagnosis not present

## 2022-06-04 DIAGNOSIS — Z515 Encounter for palliative care: Secondary | ICD-10-CM

## 2022-06-04 DIAGNOSIS — Z7189 Other specified counseling: Secondary | ICD-10-CM

## 2022-06-04 DIAGNOSIS — J9621 Acute and chronic respiratory failure with hypoxia: Secondary | ICD-10-CM | POA: Diagnosis not present

## 2022-06-04 DIAGNOSIS — C3491 Malignant neoplasm of unspecified part of right bronchus or lung: Secondary | ICD-10-CM | POA: Diagnosis not present

## 2022-06-04 DIAGNOSIS — Z79899 Other long term (current) drug therapy: Secondary | ICD-10-CM

## 2022-06-04 DIAGNOSIS — Z66 Do not resuscitate: Secondary | ICD-10-CM

## 2022-06-04 DIAGNOSIS — R0600 Dyspnea, unspecified: Secondary | ICD-10-CM

## 2022-06-04 LAB — BASIC METABOLIC PANEL
Anion gap: 7 (ref 5–15)
BUN: 22 mg/dL (ref 8–23)
CO2: 40 mmol/L — ABNORMAL HIGH (ref 22–32)
Calcium: 8.9 mg/dL (ref 8.9–10.3)
Chloride: 96 mmol/L — ABNORMAL LOW (ref 98–111)
Creatinine, Ser: 0.7 mg/dL (ref 0.44–1.00)
GFR, Estimated: >60 mL/min (ref >60)
Glucose, Bld: 108 mg/dL — ABNORMAL HIGH (ref 70–99)
Potassium: 4.1 mmol/L (ref 3.5–5.1)
Sodium: 143 mmol/L (ref 135–145)

## 2022-06-04 LAB — CBC
HCT: 25.4 % — ABNORMAL LOW (ref 36.0–46.0)
Hemoglobin: 8 g/dL — ABNORMAL LOW (ref 12.0–15.0)
MCH: 34.6 pg — ABNORMAL HIGH (ref 26.0–34.0)
MCHC: 31.5 g/dL (ref 30.0–36.0)
MCV: 110 fL — ABNORMAL HIGH (ref 80.0–100.0)
Platelets: 119 10*3/uL — ABNORMAL LOW (ref 150–400)
RBC: 2.31 MIL/uL — ABNORMAL LOW (ref 3.87–5.11)
RDW: 14.7 % (ref 11.5–15.5)
WBC: 7.8 10*3/uL (ref 4.0–10.5)
nRBC: 0.4 % — ABNORMAL HIGH (ref 0.0–0.2)

## 2022-06-04 NOTE — Progress Notes (Addendum)
PCCM Brief Progress Note  Unchanged O2 requirements with exertion despite diuresis. Discussed with primary to increase lasix dose today which would be reasonable. Kidneys tolerating it so far.  Plan to discharge with hospice this week on steroids and diuresis PRN as outlined in yesterday's consult note.  Pulmonary available as needed.

## 2022-06-04 NOTE — Progress Notes (Signed)
  [facility] [place]) Hospital Liaison Note   [facility] hospital liaison will continue to follow for discharge disposition.  Per phone discussion with Mr. Dombek yesterday, plan is for hopeful discharge tomorrow.  Please call with any hospice related questions or concerns.   Thank you, Margaretmary Eddy, BSN, RN [facility] Liaison [phone]

## 2022-06-04 NOTE — Progress Notes (Signed)
PROGRESS NOTE    Sheila Shields  FMB:[id number] DOB: Jun 22, 1935 DOA: 05/29/2022 PCP: Marin Olp, MD    Chief Complaint  Patient presents with   Shortness of Breath    Brief Narrative:  Sheila Shields is a 87 y.o. female with PMH significant for stage IV adenocarcinoma of the lungs, history of left breast cancer, HTN, HLD, COPD on 4-5L oxygen at home, GERD, anxiety, osteoarthritis 11/20, patient presented to the ED from home with complaint of 4 days of worsening shortness of breath, wheezing, weakness, cough, cyanosis. EMS noted her O2 sat at 44% on 5 L.  Give her albuterol, Atrovent, IV Solu-Medrol.   In the ED, patient was afebrile, heart rate 113, blood pressure 91/57, respiratory in 20s and low 30s, required nonrebreather mask in the ED. Blood gas with pH 7.45, PCO2 44, bicarb 31, BNP 500, WBC count 7.8, hemoglobin 8.7 Respiratory virus panel negative for flu, COVID CT angio chest did not show any evidence of acute pulm embolism.  Showed severe chronic lung disease with diffuse interstitial thickening, patchy airspace opacities which have progressed compared to prior CT scan from August. Patient was given bronchodilators, IV Solu-Medrol, empiric IV antibiotics Admitted to hospitalist service   Assessment & Plan:   Principal Problem:   Acute on chronic respiratory failure with hypoxia ([place]) Active Problems:   Hyperlipemia   Essential hypertension   Gastroesophageal reflux disease   Adenocarcinoma of right lung, stage 4 (HCC)   COPD with acute exacerbation (HCC)   Acute on chronic hypoxic respiratory failure (HCC)   Malnutrition of moderate degree   COPD exacerbation (HCC)   Goals of care, counseling/discussion   Palliative care encounter   Coordination of complex care   High risk medication use   DNR (do not resuscitate)   Dyspnea  #1 acute on chronic respiratory failure with hypoxemia/acute exacerbation of COPD -Patient noted chronically on home O2, 4 to 5 L  oxygen in the setting of severe COPD and stage IV adenocarcinoma of the lung. -Patient presented to the ED with worsening shortness of breath requiring nonrebreather in the ED, imaging showed severe chronic lung disease diffuse interstitial thickening, patchy airspace opacities unclear of acute component. -CT chest done negative for PE however showed severe chronic lung disease with diffuse interstitial thickening, patchy airspace opacities which are progressed compared to prior CT scan from August. -Patient with worsening O2 requirements currently on 11 L high flow nasal cannula with no significant improvement despite diuresis with IV Lasix, Solu-Medrol. -Urine Legionella antigen negative, urine pneumococcus antigen negative. -Continue empiric IV azithromycin, IV Solu-Medrol twice daily, bronchodilators, Tessalon Perles, Mucinex. -IV Rocephin discontinued per PCCM. -Per Dr. Pietro Cassis goal O2 will be close to 85% as patient seems fairly comfortable with low O2 saturations. -Patient noted however with worsening progressive shortness of breath on minimal exertion. -VBG on admission with a pH of 7.45, pO2 of 53, bicarb of 30.6, O2 sats of 85.1%. -BNP noted elevated at 500.5 on admission, with repeat BNP of 637.6. -Patient received a dose of Lasix 40 mg IV x 1 05/31/2022 with a urine output of 850 cc. -Currently on Lasix 40 mg IV every 12 hours urine output of 1.550 L over the past 24 hours.   -Patient still with increased O2 requirements, pulmonary recommending continuation of of Lasix 40 mg IV twice daily, strict I's and O's, daily weights. -PCCM recommending changing CODE STATUS to DNR, weaning O2 to keep sats 88%, okay for desats as long as patient recovers  in less than 5 minutes remains not in distress. -PCCM also recommended diuresis IV twice daily, azithromycin x 5 days, scheduled nebs and steroids, out of bed to chair, consideration of hospice on discharge and palliative care consultation. -Once  patient with no clinical/significant improvement with hypoxia despite current medical treatment could likely subsequently transfer back to facility with hospice following. -Patient seems to be in agreement with hospice on discharge and has met with hospice liaison. -Palliative care following. -PCCM following and appreciate input and recommendations.  2.  Stage IV lung cancer -Being followed by Dr. Curt Bears in the outpatient setting. -Oncology informed of admission via epic. -Palliative care consulted and following. -Likely home with hospice following. -Outpatient follow-up.  3.  Hyperlipidemia -Continue statin.  4.  Hypertension -Patient noted not on antihypertensive medications prior to admission. -BP stable. -Monitor BP with diuresis.  5.  GERD -PPI.  6.  History of breast cancer -Continue tamoxifen.  7.  Chronic macrocytic anemia -Hemoglobin currently stable at 8.1. -Continue vitamin A21, folic acid supplementation. -Transfusion threshold hemoglobin , 7   DVT prophylaxis: Lovenox Code Status: Full Family Communication: Updated patient, family at bedside Disposition: Likely back to facility with hospice following once clinically and medically stable hopefully on Monday, 06/05/2022  Status is: Inpatient Remains inpatient appropriate because: Severity of illness   Consultants:  PCCM: Dr. Loanne Drilling 05/31/2022 Palliative care: Dr. Vinetta Bergamo 06/01/2022  Procedures:  CT angiogram chest 05/29/2022 Chest x-ray 05/29/2022 2D echo 05/31/2022   Antimicrobials:  IV azithromycin 05/29/2022>>>> IV Rocephin 05/29/2022>>>>> 06/01/2022   Subjective: Patient sitting up in bed.  States she does not feel worse.  Still with shortness of breath on minimal exertion.    Objective: Vitals:   06/03/22 1924 06/03/22 2114 06/04/22 0437 06/04/22 0930  BP:  125/62 (!) 152/78   Pulse:  73 73   Resp:  20 18   Temp:  97.8 F (36.6 C) 97.9 F (36.6 C)   TempSrc:  Oral Oral   SpO2:  98% 93% 96% 90%  Weight:      Height:        Intake/Output Summary (Last 24 hours) at 06/04/2022 1237 Last data filed at 06/04/2022 1219 Gross per 24 hour  Intake --  Output 1500 ml  Net -1500 ml    Filed Weights   06/01/22 0500 06/02/22 0610 06/03/22 0500  Weight: 72.1 kg 69 kg 68.2 kg    Examination:  General exam: NAD Respiratory system: Fine crackles noted R > L.  Some decreased breath sounds in the bases.  No wheezing.  On 11 L high flow nasal cannula.   Cardiovascular system: Regular rate rhythm no murmurs rubs or gallops.  No JVD.  No lower extremity edema.  Gastrointestinal system: Abdomen is soft, nontender, nondistended, positive bowel sounds.  No rebound.  No guarding.  Central nervous system: Alert and oriented. No focal neurological deficits. Extremities: Symmetric 5 x 5 power. Skin: No rashes, lesions or ulcers Psychiatry: Judgement and insight appear normal. Mood & affect appropriate.     Data Reviewed: I have personally reviewed following labs and imaging studies  CBC: Recent Labs  Lab 05/31/22 0502 06/01/22 0458 06/02/22 0442 06/03/22 0531 06/04/22 0529  WBC 5.4 3.8* 4.4 5.2 7.8  NEUTROABS 5.1 3.0 3.5 4.1  --   HGB 7.7* 7.9* 8.0* 8.1* 8.0*  HCT 24.6* 24.8* 25.6* 25.9* 25.4*  MCV 112.3* 111.7* 111.3* 109.3* 110.0*  PLT 125* 107* 117* 117* 119*     Basic Metabolic Panel: Recent Labs  Lab 05/30/22 0503 06/01/22 0458 06/02/22 0442 06/03/22 0531 06/04/22 0529  NA 140 142 141 140 143  K 4.4 4.2 4.3 4.2 4.1  CL 106 106 99 97* 96*  CO2 27 32 35* 38* 40*  GLUCOSE 130* 152* 162* 157* 108*  BUN _0 25* 22  CREATININE 0.58 0.73 0.69 0.83 0.70  CALCIUM 9.1 8.9 8.9 8.9 8.9  MG  --  1.7 2.0  --   --   PHOS  --  4.1 4.1  --   --      GFR: Estimated Creatinine Clearance: 46.3 mL/min (by C-G formula based on SCr of 0.7 mg/dL).  Liver Function Tests: Recent Labs  Lab 05/30/22 0503 06/01/22 0458 06/02/22 0442  AST 23  --   --   ALT 9   --   --   ALKPHOS 40  --   --   BILITOT 0.4  --   --   PROT 7.0  --   --   ALBUMIN 2.4* 2.4* 2.4*     CBG: No results for input(s): "GLUCAP" in the last 168 hours.   Recent Results (from the past 240 hour(s))  Resp Panel by RT-PCR (Flu A&B, Covid) Anterior Nasal Swab     Status: None   Collection Time: 05/29/22 12:28 PM   Specimen: Anterior Nasal Swab  Result Value Ref Range Status   SARS Coronavirus 2 by RT PCR NEGATIVE NEGATIVE Final    Comment: (NOTE) SARS-CoV-2 target nucleic acids are NOT DETECTED.  The SARS-CoV-2 RNA is generally detectable in upper respiratory specimens during the acute phase of infection. The lowest concentration of SARS-CoV-2 viral copies this assay can detect is 138 copies/mL. A negative result does not preclude SARS-Cov-2 infection and should not be used as the sole basis for treatment or other patient management decisions. A negative result may occur with  improper specimen collection/handling, submission of specimen other than nasopharyngeal swab, presence of viral mutation(s) within the areas targeted by this assay, and inadequate number of viral copies(<138 copies/mL). A negative result must be combined with clinical observations, patient history, and epidemiological information. The expected result is Negative.  Fact Sheet for Patients:  EntrepreneurPulse.com.au  Fact Sheet for Healthcare Providers:  IncredibleEmployment.be  This test is no t yet approved or cleared by the [place] FDA and  has been authorized for detection and/or diagnosis of SARS-CoV-2 by FDA under an Emergency Use Authorization (EUA). This EUA will remain  in effect (meaning this test can be used) for the duration of the COVID-19 declaration under Section 564(b)(1) of the Act, 21 U.S.C.section 360bbb-3(b)(1), unless the authorization is terminated  or revoked sooner.       Influenza A by PCR NEGATIVE NEGATIVE Final    Influenza B by PCR NEGATIVE NEGATIVE Final    Comment: (NOTE) The Xpert Xpress SARS-CoV-2/FLU/RSV plus assay is intended as an aid in the diagnosis of influenza from Nasopharyngeal swab specimens and should not be used as a sole basis for treatment. Nasal washings and aspirates are unacceptable for Xpert Xpress SARS-CoV-2/FLU/RSV testing.  Fact Sheet for Patients: EntrepreneurPulse.com.au  Fact Sheet for Healthcare Providers: IncredibleEmployment.be  This test is not yet approved or cleared by the [place] FDA and has been authorized for detection and/or diagnosis of SARS-CoV-2 by FDA under an Emergency Use Authorization (EUA). This EUA will remain in effect (meaning this test can be used) for the duration of the COVID-19 declaration under Section 564(b)(1) of the Act, 21 U.S.C. section 360bbb-3(b)(1),  unless the authorization is terminated or revoked.  Performed at [facility], [place] [address] [place], [place] [address]   Respiratory (~20 pathogens) panel by PCR     Status: None   Collection Time: 05/31/22 12:05 PM   Specimen: Nasopharyngeal Swab; Respiratory  Result Value Ref Range Status   Adenovirus NOT DETECTED NOT DETECTED Final   Coronavirus 229E NOT DETECTED NOT DETECTED Final    Comment: (NOTE) The Coronavirus on the Respiratory Panel, DOES NOT test for the novel  Coronavirus (2019 nCoV)    Coronavirus HKU1 NOT DETECTED NOT DETECTED Final   Coronavirus NL63 NOT DETECTED NOT DETECTED Final   Coronavirus OC43 NOT DETECTED NOT DETECTED Final   Metapneumovirus NOT DETECTED NOT DETECTED Final   Rhinovirus / Enterovirus NOT DETECTED NOT DETECTED Final   Influenza A NOT DETECTED NOT DETECTED Final   Influenza B NOT DETECTED NOT DETECTED Final   Parainfluenza Virus 1 NOT DETECTED NOT DETECTED Final   Parainfluenza Virus 2 NOT DETECTED NOT DETECTED Final   Parainfluenza Virus 3 NOT DETECTED NOT DETECTED Final    Parainfluenza Virus 4 NOT DETECTED NOT DETECTED Final   Respiratory Syncytial Virus NOT DETECTED NOT DETECTED Final   Bordetella pertussis NOT DETECTED NOT DETECTED Final   Bordetella Parapertussis NOT DETECTED NOT DETECTED Final   Chlamydophila pneumoniae NOT DETECTED NOT DETECTED Final   Mycoplasma pneumoniae NOT DETECTED NOT DETECTED Final    Comment: Performed at [facility], [place]. [address], [place], [place] [address]         Radiology Studies: DG CHEST PORT 1 VIEW  Result Date: 06/03/2022 CLINICAL DATA:  Worsening hypoxia. EXAM: PORTABLE CHEST 1 VIEW COMPARISON:  May 29, 2022 chest x-ray. May 29, 2022 CT scan of the chest. FINDINGS: The cardiomediastinal silhouette is stable. No pneumothorax. Diffuse opacities throughout the lungs are stable with a chronic interstitial component. More focal infiltrate in the left mid lower lung is stable in the interval as well. No other interval changes. IMPRESSION: No interval changes in diffuse interstitial opacities and more focal infiltrate in the left mid and lower lung. Electronically Signed   By: Dorise Bullion III M.D.   On: 06/03/2022 12:04        Scheduled Meds:  acetaminophen  1,000 mg Oral Q6H   arformoterol  15 mcg Nebulization BID   And   umeclidinium bromide  1 puff Inhalation Daily   aspirin  325 mg Oral Daily   budesonide  0.5 mg Nebulization BID   cholecalciferol  2,000 Units Oral Daily   clopidogrel  37.5 mg Oral Daily   cyanocobalamin  1,000 mcg Oral Daily   enoxaparin (LOVENOX) injection  40 mg Subcutaneous U54Y   folic acid  1 mg Oral Daily   furosemide  40 mg Intravenous BID   guaiFENesin  1,200 mg Oral Daily   lactose free nutrition  237 mL Oral TID WC   loratadine  10 mg Oral Daily   montelukast  10 mg Oral QHS   multivitamin with minerals  1 tablet Oral Q breakfast   pantoprazole  40 mg Oral Daily   potassium chloride SA  20 mEq Oral QODAY   predniSONE  60 mg Oral Q breakfast   Followed by    Derrill Memo ON 06/10/2022] predniSONE  50 mg Oral Q breakfast   Followed by   Derrill Memo ON 06/17/2022] predniSONE  40 mg Oral Q breakfast   rosuvastatin  10 mg Oral Daily   tamoxifen  20  mg Oral Daily   traZODone  50 mg Oral QHS   Continuous Infusions:     LOS: 6 days    Time spent: 40 minutes    Irine Seal, MD [facility]   To contact the attending provider between 7A-7P or the covering provider during after hours 7P-7A, please log into the web site [email or web address] and access using universal [place] password for that web site. If you do not have the password, please call the hospital operator.  06/04/2022, 12:37 PM

## 2022-06-04 NOTE — Plan of Care (Signed)
  Problem: Coping: Goal: Level of anxiety will decrease Outcome: Progressing   Problem: Pain Managment: Goal: General experience of comfort will improve Outcome: Progressing   Problem: Safety: Goal: Ability to remain free from injury will improve Outcome: Progressing   

## 2022-06-05 ENCOUNTER — Other Ambulatory Visit: Payer: Self-pay

## 2022-06-05 DIAGNOSIS — Z170 Estrogen receptor positive status [ER+]: Secondary | ICD-10-CM

## 2022-06-05 DIAGNOSIS — C3491 Malignant neoplasm of unspecified part of right bronchus or lung: Secondary | ICD-10-CM | POA: Diagnosis not present

## 2022-06-05 DIAGNOSIS — C50412 Malignant neoplasm of upper-outer quadrant of left female breast: Secondary | ICD-10-CM

## 2022-06-05 DIAGNOSIS — R0600 Dyspnea, unspecified: Secondary | ICD-10-CM | POA: Diagnosis not present

## 2022-06-05 DIAGNOSIS — Z515 Encounter for palliative care: Secondary | ICD-10-CM | POA: Diagnosis not present

## 2022-06-05 DIAGNOSIS — J9621 Acute and chronic respiratory failure with hypoxia: Secondary | ICD-10-CM | POA: Diagnosis not present

## 2022-06-05 LAB — BASIC METABOLIC PANEL
Anion gap: 9 (ref 5–15)
BUN: 24 mg/dL — ABNORMAL HIGH (ref 8–23)
CO2: 37 mmol/L — ABNORMAL HIGH (ref 22–32)
Calcium: 8.9 mg/dL (ref 8.9–10.3)
Chloride: 93 mmol/L — ABNORMAL LOW (ref 98–111)
Creatinine, Ser: 0.59 mg/dL (ref 0.44–1.00)
GFR, Estimated: >60 mL/min (ref >60)
Glucose, Bld: 108 mg/dL — ABNORMAL HIGH (ref 70–99)
Potassium: 3.7 mmol/L (ref 3.5–5.1)
Sodium: 139 mmol/L (ref 135–145)

## 2022-06-05 MED ORDER — SODIUM CHLORIDE 3 % IN NEBU
4.0000 mL | INHALATION_SOLUTION | Freq: Two times a day (BID) | RESPIRATORY_TRACT | Status: DC
  Administered 2022-06-05 – 2022-06-07 (×5): 4 mL via RESPIRATORY_TRACT
  Filled 2022-06-05 (×6): qty 4

## 2022-06-05 MED ORDER — ALBUMIN HUMAN 25 % IV SOLN
25.0000 g | Freq: Once | INTRAVENOUS | Status: AC
  Administered 2022-06-05: 25 g via INTRAVENOUS
  Filled 2022-06-05: qty 100

## 2022-06-05 NOTE — Progress Notes (Signed)
Daily Progress Note   Patient Name: Sheila Shields       Date: 06/05/2022 DOB: 12-06-1934  Age: 87 y.o. MRN#: [id number] Attending Physician: Eugenie Filler, MD Primary Care Physician: Marin Olp, MD Admit Date: 05/29/2022 Length of Stay: 7 days  Reason for Consultation/Follow-up: Establishing goals of care and symptom management  Subjective:   CC: Patient laying in bed denying any increased in breathing effort at this time.  Following up regarding complex medical decision making and symptom management.  Subjective:  As per EMR review prior to seeing patient, patient required tramadol 50 mg x 2 doses over the past 24 hours to assist with dyspnea management.  Plan is for patient to discharge to her facility with hospice once deemed appropriate for discharge.   Presented to bedside today. Patient laying comfortably in bed. Patient's daughter present at bedside. They were able to update about plans for today noting patient won't be discharging today due to her O2 requirements. Still hopeful she can go home soon as patient notes she is "bored" here. Provided emotional support.  Daughter inquired about resources for hiring out of pocket caregivers to layer in with hospice in supporting patient and husband at facility. Noted would reach out to TOC/CM regarding this.   Inquired about patient's symptom burden and she noted tramadol continues to help with dyspnea management.   All questions answered at that time. Noted this PMT provider was going off service though family could reach back out if our team was needed moving forward. Patient still planning for hospice support on discharge.   Review of Systems Symptom of dyspnea at baseline and continues to improve with tramadol as needed. Objective:   Vital Signs:  BP (!) 107/51 (BP Location: Left Arm)   Pulse 79   Temp 98.4 F (36.9 C) (Oral)   Resp (!) 22   Ht 5\' 5"  (1.651 m)   Wt 62 kg   SpO2 (!) 88%   BMI 22.75 kg/m    Physical Exam: General: NAD, alert, pleasant, laying in bed Eyes: No discharge noted HENT: normocephalic, atraumatic, moist mucous membranes Cardiovascular: RRR, no edema in LE b/l Respiratory: no increased work of breathing noted, not in respiratory distress, remains on HFNC  with support Abdomen: not distended Extremities: Purposeful movements noted in all extremities Skin: no rashes or lesions on visible skin Neuro: A&Ox4, following commands easily Psych: appropriately answers all questions  Imaging: I personally reviewed recent imaging.   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  Patient being managed by hospitalist as well as PCCM.  Seeding management for acute on chronic respiratory failure, likely multifactorial.  CT imaging negative for PE however showed severe chronic lung disease with diffuse interstitial thickening, patchy airspace opacities which are progressed compared to prior CT scan from August; patient's oxygen requirements have worsened.  Palliative care consulted to assist with complex medical decision making  and symptom management.   Recommendations/Plan: # Complex medical decision making/goals of care:  - Patient plans to return to her home with hospice support when determined appropriate for discharge.   -Daughter requesting information about hiring out of pocket care to layer in  with hospice. Messaged CM/TOC about providing this.                 -Patient already has ACP documentation on file from 2018.  She confirmed what the document states such as her healthcare power of attorney if she was  unable to speak for herself would be her husband, Sheila Shields.  Her primary alternate would be her daughter Sheila Shields and her secondary alternate would be her daughter Sheila Shields.    -  Code Status: DNR    # Symptom management:      Dyspnea                                    -Continue tramadol frequency to 50 mg every 4 hours as needed. Please make sure has medication at discharge to continue taking.   # Psycho-social/Spiritual Support:  - Support System: Husband, 2 daughters, 2 stepdaughters  # Discharge Planning: home with hospice  Discussed with: patient, family  Thank you for allowing the palliative medicine team to participate in the care [name]. PMT will now sign off as patient's goals for medical care are determined. Should PMT be needed in the future, please reach out to reconsult. Thank you.   Chelsea Aus, DO Palliative Care Provider PMT # [phone]  If patient remains symptomatic despite maximum doses, please call PMT at [phone] between 0700 and 1900. Outside of these hours, please call attending, as PMT does not have night coverage.  This provider spent a total of 37 minutes providing patient's care.  Includes review of EMR, discussing care with other staff members involved in patient's medical care, obtaining relevant history and information from patient and/or patient's family, and personal review of imaging and lab work. Greater than 50% of the time was spent counseling and coordinating care related to the above assessment and plan.

## 2022-06-05 NOTE — TOC Progression Note (Addendum)
Transition of Care [facility]) - Progression Note    Patient Details  Name: Sheila Shields MRN: [id number] Date of Birth: 16-May-1935  Transition of Care [facility]) CM/SW Contact  Joaquin Courts, RN Phone Number: 06/05/2022, 4:42 PM  Clinical Narrative:    CM spoke with patient's daughter regarding private duty home care, daughter reports patient is from independent living at [facility] and resides with her elderly spouse.  Daughter feels patient may need additional care in the home beyond services offered by hospice.  Daughter reports patient has a long term care policy,  CM advised daughter that unfortunately CM is unable to recommend an agency, however [facility] does have private duty services that are offered to residents.  Daughter reports that she will need to follow up tomorrow with the [facility] rep who manages those services as she is off today.  CM also advised daughter to contact the long term care insurance company to better understand the benefits offered by the police and the extent to which the policy will cover private duty costs.  Daughter also asks about equipment, states she has spoken with [facility] and indicated she will need a hospital bed, wheelchair, and bedside commode, reports patient already has home O2.   Expected Discharge Plan: [place] Barriers to Discharge: No Barriers Identified  Expected Discharge Plan and Services Expected Discharge Plan: [place]   Discharge Planning Services: CM Consult Post Acute Care Choice: Durable Medical Equipment, Home Health (Active home 02,rw,cane;HHPT-through [facility]) Living arrangements for the past 2 months: Single Family Home                                       Social Determinants of Health (SDOH) Interventions    Readmission Risk Interventions    05/30/2022   10:51 AM 04/18/2022   10:49 AM  Readmission Risk Prevention Plan  Transportation Screening Complete  Complete  PCP or Specialist Appt within 3-5 Days Complete Complete  HRI or [place] Complete Complete  Social Work Consult for [place] Planning/Counseling Complete Complete  Palliative Care Screening Complete Complete  Medication Review [employer] photographer) Complete Complete

## 2022-06-05 NOTE — Progress Notes (Addendum)
PROGRESS NOTE    Sheila Shields  TMA:[id number] DOB: 26-Jun-1935 DOA: 05/29/2022 PCP: Marin Olp, MD    Chief Complaint  Patient presents with   Shortness of Breath    Brief Narrative:  Sheila Shields is a 87 y.o. female with PMH significant for stage IV adenocarcinoma of the lungs, history of left breast cancer, HTN, HLD, COPD on 4-5L oxygen at home, GERD, anxiety, osteoarthritis 11/20, patient presented to the ED from home with complaint of 4 days of worsening shortness of breath, wheezing, weakness, cough, cyanosis. EMS noted her O2 sat at 44% on 5 L.  Give her albuterol, Atrovent, IV Solu-Medrol.   In the ED, patient was afebrile, heart rate 113, blood pressure 91/57, respiratory in 20s and low 30s, required nonrebreather mask in the ED. Blood gas with pH 7.45, PCO2 44, bicarb 31, BNP 500, WBC count 7.8, hemoglobin 8.7 Respiratory virus panel negative for flu, COVID CT angio chest did not show any evidence of acute pulm embolism.  Showed severe chronic lung disease with diffuse interstitial thickening, patchy airspace opacities which have progressed compared to prior CT scan from August. Patient was given bronchodilators, IV Solu-Medrol, empiric IV antibiotics Admitted to hospitalist service   Assessment & Plan:   Principal Problem:   Acute on chronic respiratory failure with hypoxia ([place]) Active Problems:   Hyperlipemia   Essential hypertension   Gastroesophageal reflux disease   Adenocarcinoma of right lung, stage 4 (HCC)   COPD with acute exacerbation (HCC)   Acute on chronic hypoxic respiratory failure (HCC)   Malnutrition of moderate degree   COPD exacerbation (HCC)   Goals of care, counseling/discussion   Palliative care encounter   Coordination of complex care   High risk medication use   DNR (do not resuscitate)   Dyspnea  #1 acute on chronic respiratory failure with hypoxemia/acute exacerbation of COPD -Patient noted chronically on home O2, 4 to 5 L  oxygen in the setting of severe COPD and stage IV adenocarcinoma of the lung. -Patient presented to the ED with worsening shortness of breath requiring nonrebreather in the ED, imaging showed severe chronic lung disease diffuse interstitial thickening, patchy airspace opacities unclear of acute component. -CT chest done negative for PE however showed severe chronic lung disease with diffuse interstitial thickening, patchy airspace opacities which are progressed compared to prior CT scan from August. -Patient with worsening O2 requirements currently on 15 L high flow nasal cannula with nonrebreather no significant improvement despite IV diuretics and IV steroids. -Urine Legionella antigen negative, urine pneumococcus antigen negative. -Continue empiric IV azithromycin, IV Solu-Medrol twice daily, bronchodilators, Tessalon Perles, Mucinex. -IV Rocephin discontinued per PCCM. -Per Dr. Pietro Cassis goal O2 will be close to 85% as patient seems fairly comfortable with low O2 saturations. -Patient noted however with worsening progressive shortness of breath on minimal exertion. -VBG on admission with a pH of 7.45, pO2 of 53, bicarb of 30.6, O2 sats of 85.1%. -BNP noted elevated at 500.5 on admission, with repeat BNP of 637.6. -Patient received a dose of Lasix 40 mg IV x 1 05/31/2022 with a urine output of 850 cc. -Currently on Lasix 40 mg IV every 12 hours urine output of 1.3 L over the past 24 hours.   -Patient still with increased O2 requirements, pulmonary recommending continuation of of Lasix 40 mg IV twice daily, strict I's and O's, daily weights. -PCCM recommending changing CODE STATUS to DNR, weaning O2 to keep sats 88%, okay for desats as long as patient  recovers in less than 5 minutes remains not in distress. -Will give a dose of IV albumin x 1 in addition to the IV diuretics to see if increased urine output with improvement with hypoxia. -3% saline nebs. -PCCM also recommended diuresis IV twice daily,  azithromycin x 5 days, scheduled nebs and steroids, out of bed to chair, consideration of hospice on discharge and palliative care consultation. -Once patient with no clinical/significant improvement with hypoxia despite current medical treatment could likely subsequently transfer back to facility with hospice following. -Patient seems to be in agreement with hospice on discharge and has met with hospice liaison. -Palliative care following. -PCCM following and appreciate input and recommendations.  2.  Stage IV lung cancer -Being followed by Dr. Curt Bears in the outpatient setting. -Oncology informed of admission via epic. -Palliative care consulted and following. -Likely home with hospice following. -Outpatient follow-up.  3.  Hyperlipidemia -Continue statin.  4.  Hypertension -Patient noted not on antihypertensive medications prior to admission. -BP stable. -Monitor BP with diuresis.  5.  GERD -Continue PPI.  6.  History of breast cancer -Continue tamoxifen.  7.  Chronic macrocytic anemia -Hemoglobin currently stable at 8.0. -Continue vitamin W46, folic acid supplementation. -Transfusion threshold hemoglobin , 7   DVT prophylaxis: Lovenox Code Status: Full Family Communication: Updated patient, family at bedside Disposition: Likely back to facility with hospice following once clinically and medically stable hopefully on Monday, 06/05/2022  Status is: Inpatient Remains inpatient appropriate because: Severity of illness   Consultants:  PCCM: Dr. Loanne Drilling 05/31/2022 Palliative care: Dr. Vinetta Bergamo 06/01/2022  Procedures:  CT angiogram chest 05/29/2022 Chest x-ray 05/29/2022 2D echo 05/31/2022   Antimicrobials:  IV azithromycin 05/29/2022>>>> IV Rocephin 05/29/2022>>>>> 06/01/2022   Subjective: Patient on nonrebreather, noted to have desatted on transfer to bedside commode currently on nonrebreather at 12-15 L high flow oxygen.  Denies any significant worsening  shortness of breath.  Does have shortness of breath on minimal exertion weight hypoxia.  Family at bedside.    Objective: Vitals:   06/04/22 2300 06/05/22 0500 06/05/22 0906 06/05/22 1149  BP:    (!) 99/46  Pulse:    82  Resp:    19  Temp:      TempSrc:      SpO2: (!) 85%  (!) 88% 97%  Weight:  62 kg    Height:        Intake/Output Summary (Last 24 hours) at 06/05/2022 1158 Last data filed at 06/04/2022 2058 Gross per 24 hour  Intake --  Output 1300 ml  Net -1300 ml    Filed Weights   06/02/22 0610 06/03/22 0500 06/05/22 0500  Weight: 69 kg 68.2 kg 62 kg    Examination:  General exam: NAD Respiratory system: Fine crackles noted R > L.  Some decreased breath sounds in the bases.  No wheezing.  On 15 L high flow nasal cannula.   Cardiovascular system: RRR no murmurs rubs or gallops.  No JVD.  No lower extremity edema. Gastrointestinal system: Abdomen is soft, nontender, nondistended, positive bowel sounds.  No rebound.  No guarding.  Central nervous system: Alert and oriented. No focal neurological deficits. Extremities: Symmetric 5 x 5 power. Skin: No rashes, lesions or ulcers Psychiatry: Judgement and insight appear normal. Mood & affect appropriate.     Data Reviewed: I have personally reviewed following labs and imaging studies  CBC: Recent Labs  Lab 05/31/22 0502 06/01/22 0458 06/02/22 0442 06/03/22 0531 06/04/22 0529  WBC 5.4 3.8* 4.4 5.2  7.8  NEUTROABS 5.1 3.0 3.5 4.1  --   HGB 7.7* 7.9* 8.0* 8.1* 8.0*  HCT 24.6* 24.8* 25.6* 25.9* 25.4*  MCV 112.3* 111.7* 111.3* 109.3* 110.0*  PLT 125* 107* 117* 117* 119*     Basic Metabolic Panel: Recent Labs  Lab 06/01/22 0458 06/02/22 0442 06/03/22 0531 06/04/22 0529 06/05/22 0405  NA 142 141 140 143 139  K 4.2 4.3 4.2 4.1 3.7  CL 106 99 97* 96* 93*  CO2 32 35* 38* 40* 37*  GLUCOSE 152* 162* 157* 108* 108*  BUN 19 20 25* 22 24*  CREATININE 0.73 0.69 0.83 0.70 0.59  CALCIUM 8.9 8.9 8.9 8.9 8.9  MG 1.7  2.0  --   --   --   PHOS 4.1 4.1  --   --   --      GFR: Estimated Creatinine Clearance: 46.3 mL/min (by C-G formula based on SCr of 0.59 mg/dL).  Liver Function Tests: Recent Labs  Lab 05/30/22 0503 06/01/22 0458 06/02/22 0442  AST 23  --   --   ALT 9  --   --   ALKPHOS 40  --   --   BILITOT 0.4  --   --   PROT 7.0  --   --   ALBUMIN 2.4* 2.4* 2.4*     CBG: No results for input(s): "GLUCAP" in the last 168 hours.   Recent Results (from the past 240 hour(s))  Resp Panel by RT-PCR (Flu A&B, Covid) Anterior Nasal Swab     Status: None   Collection Time: 05/29/22 12:28 PM   Specimen: Anterior Nasal Swab  Result Value Ref Range Status   SARS Coronavirus 2 by RT PCR NEGATIVE NEGATIVE Final    Comment: (NOTE) SARS-CoV-2 target nucleic acids are NOT DETECTED.  The SARS-CoV-2 RNA is generally detectable in upper respiratory specimens during the acute phase of infection. The lowest concentration of SARS-CoV-2 viral copies this assay can detect is 138 copies/mL. A negative result does not preclude SARS-Cov-2 infection and should not be used as the sole basis for treatment or other patient management decisions. A negative result may occur with  improper specimen collection/handling, submission of specimen other than nasopharyngeal swab, presence of viral mutation(s) within the areas targeted by this assay, and inadequate number of viral copies(<138 copies/mL). A negative result must be combined with clinical observations, patient history, and epidemiological information. The expected result is Negative.  Fact Sheet for Patients:  EntrepreneurPulse.com.au  Fact Sheet for Healthcare Providers:  IncredibleEmployment.be  This test is no t yet approved or cleared by the [place] FDA and  has been authorized for detection and/or diagnosis of SARS-CoV-2 by FDA under an Emergency Use Authorization (EUA). This EUA will remain  in effect  (meaning this test can be used) for the duration of the COVID-19 declaration under Section 564(b)(1) of the Act, 21 U.S.C.section 360bbb-3(b)(1), unless the authorization is terminated  or revoked sooner.       Influenza A by PCR NEGATIVE NEGATIVE Final   Influenza B by PCR NEGATIVE NEGATIVE Final    Comment: (NOTE) The Xpert Xpress SARS-CoV-2/FLU/RSV plus assay is intended as an aid in the diagnosis of influenza from Nasopharyngeal swab specimens and should not be used as a sole basis for treatment. Nasal washings and aspirates are unacceptable for Xpert Xpress SARS-CoV-2/FLU/RSV testing.  Fact Sheet for Patients: EntrepreneurPulse.com.au  Fact Sheet for Healthcare Providers: IncredibleEmployment.be  This test is not yet approved or cleared by the [place]  FDA and has been authorized for detection and/or diagnosis of SARS-CoV-2 by FDA under an Emergency Use Authorization (EUA). This EUA will remain in effect (meaning this test can be used) for the duration of the COVID-19 declaration under Section 564(b)(1) of the Act, 21 U.S.C. section 360bbb-3(b)(1), unless the authorization is terminated or revoked.  Performed at [facility], [place] [address] [place], [place] [address]   Respiratory (~20 pathogens) panel by PCR     Status: None   Collection Time: 05/31/22 12:05 PM   Specimen: Nasopharyngeal Swab; Respiratory  Result Value Ref Range Status   Adenovirus NOT DETECTED NOT DETECTED Final   Coronavirus 229E NOT DETECTED NOT DETECTED Final    Comment: (NOTE) The Coronavirus on the Respiratory Panel, DOES NOT test for the novel  Coronavirus (2019 nCoV)    Coronavirus HKU1 NOT DETECTED NOT DETECTED Final   Coronavirus NL63 NOT DETECTED NOT DETECTED Final   Coronavirus OC43 NOT DETECTED NOT DETECTED Final   Metapneumovirus NOT DETECTED NOT DETECTED Final   Rhinovirus / Enterovirus NOT DETECTED NOT DETECTED Final    Influenza A NOT DETECTED NOT DETECTED Final   Influenza B NOT DETECTED NOT DETECTED Final   Parainfluenza Virus 1 NOT DETECTED NOT DETECTED Final   Parainfluenza Virus 2 NOT DETECTED NOT DETECTED Final   Parainfluenza Virus 3 NOT DETECTED NOT DETECTED Final   Parainfluenza Virus 4 NOT DETECTED NOT DETECTED Final   Respiratory Syncytial Virus NOT DETECTED NOT DETECTED Final   Bordetella pertussis NOT DETECTED NOT DETECTED Final   Bordetella Parapertussis NOT DETECTED NOT DETECTED Final   Chlamydophila pneumoniae NOT DETECTED NOT DETECTED Final   Mycoplasma pneumoniae NOT DETECTED NOT DETECTED Final    Comment: Performed at [facility], [place]. [address] [place], [place] [address]         Radiology Studies: No results found.      Scheduled Meds:  acetaminophen  1,000 mg Oral Q6H   arformoterol  15 mcg Nebulization BID   And   umeclidinium bromide  1 puff Inhalation Daily   aspirin  325 mg Oral Daily   budesonide  0.5 mg Nebulization BID   cholecalciferol  2,000 Units Oral Daily   clopidogrel  37.5 mg Oral Daily   cyanocobalamin  1,000 mcg Oral Daily   enoxaparin (LOVENOX) injection  40 mg Subcutaneous M62H   folic acid  1 mg Oral Daily   furosemide  40 mg Intravenous BID   guaiFENesin  1,200 mg Oral Daily   lactose free nutrition  237 mL Oral TID WC   loratadine  10 mg Oral Daily   montelukast  10 mg Oral QHS   multivitamin with minerals  1 tablet Oral Q breakfast   pantoprazole  40 mg Oral Daily   potassium chloride SA  20 mEq Oral QODAY   predniSONE  60 mg Oral Q breakfast   Followed by   Derrill Memo ON 06/10/2022] predniSONE  50 mg Oral Q breakfast   Followed by   Derrill Memo ON 06/17/2022] predniSONE  40 mg Oral Q breakfast   rosuvastatin  10 mg Oral Daily   tamoxifen  20 mg Oral Daily   traZODone  50 mg Oral QHS   Continuous Infusions:     LOS: 7 days    Time spent: 40 minutes    Irine Seal, MD [facility]   To contact the attending  provider between 7A-7P or the covering provider during after hours 7P-7A, please log into the web  site [email or web address] and access using universal [place] password for that web site. If you do not have the password, please call the hospital operator.  06/05/2022, 11:58 AM

## 2022-06-06 ENCOUNTER — Encounter: Payer: Self-pay | Admitting: Family Medicine

## 2022-06-06 DIAGNOSIS — C3491 Malignant neoplasm of unspecified part of right bronchus or lung: Secondary | ICD-10-CM | POA: Diagnosis not present

## 2022-06-06 DIAGNOSIS — J9621 Acute and chronic respiratory failure with hypoxia: Secondary | ICD-10-CM | POA: Diagnosis not present

## 2022-06-06 DIAGNOSIS — R0600 Dyspnea, unspecified: Secondary | ICD-10-CM | POA: Diagnosis not present

## 2022-06-06 DIAGNOSIS — Z515 Encounter for palliative care: Secondary | ICD-10-CM | POA: Diagnosis not present

## 2022-06-06 LAB — BASIC METABOLIC PANEL
Anion gap: 12 (ref 5–15)
BUN: 24 mg/dL — ABNORMAL HIGH (ref 8–23)
CO2: 36 mmol/L — ABNORMAL HIGH (ref 22–32)
Calcium: 8.9 mg/dL (ref 8.9–10.3)
Chloride: 94 mmol/L — ABNORMAL LOW (ref 98–111)
Creatinine, Ser: 0.52 mg/dL (ref 0.44–1.00)
GFR, Estimated: >60 mL/min (ref >60)
Glucose, Bld: 113 mg/dL — ABNORMAL HIGH (ref 70–99)
Potassium: 3.6 mmol/L (ref 3.5–5.1)
Sodium: 142 mmol/L (ref 135–145)

## 2022-06-06 MED ORDER — FUROSEMIDE 10 MG/ML IJ SOLN
60.0000 mg | Freq: Two times a day (BID) | INTRAMUSCULAR | Status: DC
  Administered 2022-06-06 – 2022-06-07 (×3): 60 mg via INTRAVENOUS
  Filled 2022-06-06 (×3): qty 6

## 2022-06-06 MED ORDER — BOOST PLUS PO LIQD
237.0000 mL | ORAL | Status: DC
  Filled 2022-06-06: qty 237

## 2022-06-06 MED ORDER — ALBUMIN HUMAN 25 % IV SOLN
25.0000 g | Freq: Once | INTRAVENOUS | Status: AC
  Administered 2022-06-06: 25 g via INTRAVENOUS
  Filled 2022-06-06: qty 100

## 2022-06-06 NOTE — Progress Notes (Signed)
Nutrition Follow-up  DOCUMENTATION CODES:   Non-severe (moderate) malnutrition in context of chronic illness  INTERVENTION:   -Decrease Boost Plus to once daily   NUTRITION DIAGNOSIS:   Moderate Malnutrition related to chronic illness, cancer and cancer related treatments (COPD) as evidenced by moderate muscle depletion, mild fat depletion.  Ongoing.  GOAL:   Patient will meet greater than or equal to 90% of their needs  Progressing.  MONITOR:   PO intake, Supplement acceptance, Labs, Weight trends, I & O's  ASSESSMENT:   87 y.o. female with PMH significant for stage IV adenocarcinoma of the lungs, history of left breast cancer, HTN, HLD, COPD on 4-5L oxygen at home, GERD, anxiety, osteoarthritis  11/20, patient presented to the ED from home with complaint of 4 days of worsening shortness of breath, wheezing, weakness, cough, cyanosis.  Patient currently consuming 25-50% of meals. Not drinking Boost supplements. Taking all vitamin supplements. Per chart review, pt expected to discharge home with hospice.   Admission weight: 155 lbs. Current weight: 137 lbs  Medications: Vitamin D3, Vitamin E-09, Folic acid, Lasix, Multivitamin with minerals daily, KLOR-CON  Labs reviewed.  Diet Order:   Diet Order             Diet Heart Room service appropriate? Yes; Fluid consistency: Thin  Diet effective now                   EDUCATION NEEDS:   No education needs have been identified at this time  Skin:  Skin Assessment: Reviewed RN Assessment  Last BM:  11/27 -type 3  Height:   Ht Readings from Last 1 Encounters:  05/29/22 5\' 5"  (1.651 m)    Weight:   Wt Readings from Last 1 Encounters:  06/06/22 62.5 kg    BMI:  Body mass index is 22.93 kg/m.  Estimated Nutritional Needs:   Kcal:  2100-2300  Protein:  105-115g  Fluid:  2.1L/day  Clayton Bibles, MS, RD, LDN Inpatient Clinical Dietitian Contact information available via Amion

## 2022-06-06 NOTE — Progress Notes (Signed)
Made on call aware that patient's BP this morning was 160/59, especially since the patient's BP was 89/57 close to the beginning of the shift. However, patient was laying on her side when the BP was taken and refused to have the BP rechecked.

## 2022-06-06 NOTE — Progress Notes (Signed)
RN received report on patient from off-going RN. RN will resume patient's care until 06/06/22 at 0700. RN agrees with patient's most recent assessment. Will continue to monitor patient for any changes.

## 2022-06-06 NOTE — Progress Notes (Signed)
PROGRESS NOTE    Sheila Shields  FEO:[id number] DOB: 1935/03/26 DOA: 05/29/2022 PCP: Marin Olp, MD    Chief Complaint  Patient presents with   Shortness of Breath    Brief Narrative:  Sheila Shields is a 87 y.o. female with PMH significant for stage IV adenocarcinoma of the lungs, history of left breast cancer, HTN, HLD, COPD on 4-5L oxygen at home, GERD, anxiety, osteoarthritis 11/20, patient presented to the ED from home with complaint of 4 days of worsening shortness of breath, wheezing, weakness, cough, cyanosis. EMS noted her O2 sat at 44% on 5 L.  Give her albuterol, Atrovent, IV Solu-Medrol.   In the ED, patient was afebrile, heart rate 113, blood pressure 91/57, respiratory in 20s and low 30s, required nonrebreather mask in the ED. Blood gas with pH 7.45, PCO2 44, bicarb 31, BNP 500, WBC count 7.8, hemoglobin 8.7 Respiratory virus panel negative for flu, COVID CT angio chest did not show any evidence of acute pulm embolism.  Showed severe chronic lung disease with diffuse interstitial thickening, patchy airspace opacities which have progressed compared to prior CT scan from August. Patient was given bronchodilators, IV Solu-Medrol, empiric IV antibiotics Admitted to hospitalist service   Assessment & Plan:   Principal Problem:   Acute on chronic respiratory failure with hypoxia ([place]) Active Problems:   Hyperlipemia   Essential hypertension   Gastroesophageal reflux disease   Adenocarcinoma of right lung, stage 4 (HCC)   COPD with acute exacerbation (HCC)   Acute on chronic hypoxic respiratory failure (HCC)   Malnutrition of moderate degree   COPD exacerbation (HCC)   Goals of care, counseling/discussion   Palliative care encounter   Coordination of complex care   High risk medication use   DNR (do not resuscitate)   Dyspnea  #1 acute on chronic respiratory failure with hypoxemia/acute exacerbation of COPD -Patient noted chronically on home O2, 4 to 5 L  oxygen in the setting of severe COPD and stage IV adenocarcinoma of the lung. -Patient presented to the ED with worsening shortness of breath requiring nonrebreather in the ED, imaging showed severe chronic lung disease diffuse interstitial thickening, patchy airspace opacities unclear of acute component. -CT chest done negative for PE however showed severe chronic lung disease with diffuse interstitial thickening, patchy airspace opacities which are progressed compared to prior CT scan from August. -Patient with worsening O2 requirements currently on 15 L high flow nasal cannula with nonrebreather no significant improvement despite IV diuretics and IV steroids. -Urine Legionella antigen negative, urine pneumococcus antigen negative. -Continue empiric IV azithromycin, IV Solu-Medrol twice daily, bronchodilators, Tessalon Perles, Mucinex. -IV Rocephin discontinued per PCCM. -Per Dr. Pietro Cassis goal O2 will be close to 85% as patient seems fairly comfortable with low O2 saturations. -Patient noted however with worsening progressive shortness of breath on minimal exertion. -VBG on admission with a pH of 7.45, pO2 of 53, bicarb of 30.6, O2 sats of 85.1%. -BNP noted elevated at 500.5 on admission, with repeat BNP of 637.6. -Patient received a dose of Lasix 40 mg IV x 1 05/31/2022 with a urine output of 850 cc. -Currently on Lasix 40 mg IV every 12 hours urine output of 1.4 L over the past 24 hours.   -Patient still with increased O2 requirements, pulmonary recommending continuation of of Lasix 40 mg IV twice daily, strict I's and O's, daily weights. -PCCM recommending changing CODE STATUS to DNR, weaning O2 to keep sats 88%, okay for desats as long as patient  recovers in less than 5 minutes remains not in distress. -Status post IV albumin 06/05/2022 in addition to IV diuretics, will give another dose of IV albumin today 06/06/2022 to see if this some improvement with O2 requirements in the next 24 hours. -  continue 3% saline nebs. -PCCM also recommended diuresis IV twice daily, azithromycin x 5 days (completed), scheduled nebs and steroids, out of bed to chair, consideration of hospice on discharge and palliative care consultation. -Once patient with no clinical/significant improvement with hypoxia despite current medical treatment could likely subsequently transfer back to facility with hospice following. -Patient seems to be in agreement with hospice on discharge and has met with hospice liaison. -Palliative care following. -PCCM following and appreciate input and recommendations.  2.  Stage IV lung cancer -Being followed by Dr. Curt Bears in the outpatient setting. -Oncology informed of admission via epic. -Palliative care consulted and following. -Back to facility with hospice following.   -Outpatient follow-up.  3.  Hyperlipidemia -Statin.  4.  Hypertension -Patient noted not on antihypertensive medications prior to admission. -BP stable. -Monitor BP with diuresis.  5.  GERD -PPI  6.  History of breast cancer -Tamoxifen  7.  Chronic macrocytic anemia -Hemoglobin currently stable at 8.0. -Continue vitamin H68, folic acid supplementation. -Transfusion threshold hemoglobin , 7   DVT prophylaxis: Lovenox Code Status: Full Family Communication: Updated patient, family at bedside Disposition: Likely back to facility with hospice following some improvement with O2 requirements, could potentially discharge if requiring 12 to 13 L O2 with sats 85% or greater.  Hopefully tomorrow.   Status is: Inpatient Remains inpatient appropriate because: Severity of illness   Consultants:  PCCM: Dr. Loanne Drilling 05/31/2022 Palliative care: Dr. Vinetta Bergamo 06/01/2022  Procedures:  CT angiogram chest 05/29/2022 Chest x-ray 05/29/2022 2D echo 05/31/2022   Antimicrobials:  IV azithromycin 05/29/2022>>>> 06/03/2022 IV Rocephin 05/29/2022>>>>> 06/01/2022   Subjective: Patient in bed.  Still  with increased O2 requirements on 15 L high flow.  Nonrebreather about to be placed.  Still with shortness of breath on minimal exertion.  Family at bedside.  Complain of unable to get congestion/cough up.   Objective: Vitals:   06/06/22 0856 06/06/22 1100 06/06/22 1130 06/06/22 1152  BP:    107/61  Pulse:    83  Resp:    (!) 22  Temp:    (!) 97.4 F (36.3 C)  TempSrc:    Oral  SpO2: (!) 86% (!) 89% (!) 80% 96%  Weight:      Height:        Intake/Output Summary (Last 24 hours) at 06/06/2022 1526 Last data filed at 06/06/2022 1400 Gross per 24 hour  Intake 480 ml  Output 1450 ml  Net -970 ml    Filed Weights   06/03/22 0500 06/05/22 0500 06/06/22 0500  Weight: 68.2 kg 62 kg 62.5 kg    Examination:  General exam: NAD Respiratory system: Fine crackles noted R > L.  Decreased breath sounds in the bases.  No wheezing noted.  On 15 L high flow nasal cannula.   Cardiovascular system: Regular rate rhythm no murmurs rubs or gallops.  No JVD.  No lower extremity edema. Gastrointestinal system: Abdomen is soft, nontender, nondistended, positive bowel sounds.  No rebound.  No guarding.  Central nervous system: Alert and oriented. No focal neurological deficits. Extremities: Symmetric 5 x 5 power. Skin: No rashes, lesions or ulcers Psychiatry: Judgement and insight appear normal. Mood & affect appropriate.     Data Reviewed: I  have personally reviewed following labs and imaging studies  CBC: Recent Labs  Lab 05/31/22 0502 06/01/22 0458 06/02/22 0442 06/03/22 0531 06/04/22 0529  WBC 5.4 3.8* 4.4 5.2 7.8  NEUTROABS 5.1 3.0 3.5 4.1  --   HGB 7.7* 7.9* 8.0* 8.1* 8.0*  HCT 24.6* 24.8* 25.6* 25.9* 25.4*  MCV 112.3* 111.7* 111.3* 109.3* 110.0*  PLT 125* 107* 117* 117* 119*     Basic Metabolic Panel: Recent Labs  Lab 06/01/22 0458 06/02/22 0442 06/03/22 0531 06/04/22 0529 06/05/22 0405 06/06/22 0501  NA 142 141 140 143 139 142  K 4.2 4.3 4.2 4.1 3.7 3.6  CL 106 99  97* 96* 93* 94*  CO2 32 35* 38* 40* 37* 36*  GLUCOSE 152* 162* 157* 108* 108* 113*  BUN 19 20 25* 22 24* 24*  CREATININE 0.73 0.69 0.83 0.70 0.59 0.52  CALCIUM 8.9 8.9 8.9 8.9 8.9 8.9  MG 1.7 2.0  --   --   --   --   PHOS 4.1 4.1  --   --   --   --      GFR: Estimated Creatinine Clearance: 46.3 mL/min (by C-G formula based on SCr of 0.52 mg/dL).  Liver Function Tests: Recent Labs  Lab 06/01/22 0458 06/02/22 0442  ALBUMIN 2.4* 2.4*     CBG: No results for input(s): "GLUCAP" in the last 168 hours.   Recent Results (from the past 240 hour(s))  Resp Panel by RT-PCR (Flu A&B, Covid) Anterior Nasal Swab     Status: None   Collection Time: 05/29/22 12:28 PM   Specimen: Anterior Nasal Swab  Result Value Ref Range Status   SARS Coronavirus 2 by RT PCR NEGATIVE NEGATIVE Final    Comment: (NOTE) SARS-CoV-2 target nucleic acids are NOT DETECTED.  The SARS-CoV-2 RNA is generally detectable in upper respiratory specimens during the acute phase of infection. The lowest concentration of SARS-CoV-2 viral copies this assay can detect is 138 copies/mL. A negative result does not preclude SARS-Cov-2 infection and should not be used as the sole basis for treatment or other patient management decisions. A negative result may occur with  improper specimen collection/handling, submission of specimen other than nasopharyngeal swab, presence of viral mutation(s) within the areas targeted by this assay, and inadequate number of viral copies(<138 copies/mL). A negative result must be combined with clinical observations, patient history, and epidemiological information. The expected result is Negative.  Fact Sheet for Patients:  EntrepreneurPulse.com.au  Fact Sheet for Healthcare Providers:  IncredibleEmployment.be  This test is no t yet approved or cleared by the [place] FDA and  has been authorized for detection and/or diagnosis of SARS-CoV-2  by FDA under an Emergency Use Authorization (EUA). This EUA will remain  in effect (meaning this test can be used) for the duration of the COVID-19 declaration under Section 564(b)(1) of the Act, 21 U.S.C.section 360bbb-3(b)(1), unless the authorization is terminated  or revoked sooner.       Influenza A by PCR NEGATIVE NEGATIVE Final   Influenza B by PCR NEGATIVE NEGATIVE Final    Comment: (NOTE) The Xpert Xpress SARS-CoV-2/FLU/RSV plus assay is intended as an aid in the diagnosis of influenza from Nasopharyngeal swab specimens and should not be used as a sole basis for treatment. Nasal washings and aspirates are unacceptable for Xpert Xpress SARS-CoV-2/FLU/RSV testing.  Fact Sheet for Patients: EntrepreneurPulse.com.au  Fact Sheet for Healthcare Providers: IncredibleEmployment.be  This test is not yet approved or cleared by the [place] and  has been authorized for detection and/or diagnosis of SARS-CoV-2 by FDA under an Emergency Use Authorization (EUA). This EUA will remain in effect (meaning this test can be used) for the duration of the COVID-19 declaration under Section 564(b)(1) of the Act, 21 U.S.C. section 360bbb-3(b)(1), unless the authorization is terminated or revoked.  Performed at [facility], [place] [address], [place], [place] [address]   Respiratory (~20 pathogens) panel by PCR     Status: None   Collection Time: 05/31/22 12:05 PM   Specimen: Nasopharyngeal Swab; Respiratory  Result Value Ref Range Status   Adenovirus NOT DETECTED NOT DETECTED Final   Coronavirus 229E NOT DETECTED NOT DETECTED Final    Comment: (NOTE) The Coronavirus on the Respiratory Panel, DOES NOT test for the novel  Coronavirus (2019 nCoV)    Coronavirus HKU1 NOT DETECTED NOT DETECTED Final   Coronavirus NL63 NOT DETECTED NOT DETECTED Final   Coronavirus OC43 NOT DETECTED NOT DETECTED Final   Metapneumovirus NOT DETECTED  NOT DETECTED Final   Rhinovirus / Enterovirus NOT DETECTED NOT DETECTED Final   Influenza A NOT DETECTED NOT DETECTED Final   Influenza B NOT DETECTED NOT DETECTED Final   Parainfluenza Virus 1 NOT DETECTED NOT DETECTED Final   Parainfluenza Virus 2 NOT DETECTED NOT DETECTED Final   Parainfluenza Virus 3 NOT DETECTED NOT DETECTED Final   Parainfluenza Virus 4 NOT DETECTED NOT DETECTED Final   Respiratory Syncytial Virus NOT DETECTED NOT DETECTED Final   Bordetella pertussis NOT DETECTED NOT DETECTED Final   Bordetella Parapertussis NOT DETECTED NOT DETECTED Final   Chlamydophila pneumoniae NOT DETECTED NOT DETECTED Final   Mycoplasma pneumoniae NOT DETECTED NOT DETECTED Final    Comment: Performed at [facility], [place]. [address] [place],  [address]         Radiology Studies: No results found.      Scheduled Meds:  acetaminophen  1,000 mg Oral Q6H   arformoterol  15 mcg Nebulization BID   And   umeclidinium bromide  1 puff Inhalation Daily   aspirin  325 mg Oral Daily   budesonide  0.5 mg Nebulization BID   cholecalciferol  2,000 Units Oral Daily   clopidogrel  37.5 mg Oral Daily   cyanocobalamin  1,000 mcg Oral Daily   enoxaparin (LOVENOX) injection  40 mg Subcutaneous Y40H   folic acid  1 mg Oral Daily   furosemide  60 mg Intravenous BID   guaiFENesin  1,200 mg Oral Daily   lactose free nutrition  237 mL Oral TID WC   loratadine  10 mg Oral Daily   montelukast  10 mg Oral QHS   multivitamin with minerals  1 tablet Oral Q breakfast   pantoprazole  40 mg Oral Daily   potassium chloride SA  20 mEq Oral QODAY   predniSONE  60 mg Oral Q breakfast   Followed by   Derrill Memo ON 06/10/2022] predniSONE  50 mg Oral Q breakfast   Followed by   Derrill Memo ON 06/17/2022] predniSONE  40 mg Oral Q breakfast   rosuvastatin  10 mg Oral Daily   sodium chloride HYPERTONIC  4 mL Nebulization BID   tamoxifen  20 mg Oral Daily   traZODone  50 mg Oral QHS   Continuous  Infusions:     LOS: 8 days    Time spent: 40 minutes    Irine Seal, MD [facility]   To contact the attending provider between 7A-7P or the covering provider during  after hours 7P-7A, please log into the web site [email or web address] and access using universal [place] password for that web site. If you do not have the password, please call the hospital operator.  06/06/2022, 3:26 PM

## 2022-06-06 NOTE — Progress Notes (Signed)
Daily Progress Note   Patient Name: Sheila Shields       Date: 06/06/2022 DOB: 08/22/1934  Age: 87 y.o. MRN#: [id number] Attending Physician: Eugenie Filler, MD Primary Care Physician: Marin Olp, MD Admit Date: 05/29/2022 Length of Stay: 8 days  Reason for Consultation/Follow-up: Establishing goals of care and symptom management  Subjective:   CC: Patient laying in bed denying any increased in breathing effort at this time.  Following up regarding complex medical decision making and symptom management.  Subjective:  Chart reviewed, patient seen and examined, discussed with daughter present at bedside, patient has high O2 requirements, was at 4 L nasal cannula prior to coming in, she is from [place], currently at 11 L.  Any slight movement causes oxygen desaturation, currently showing 83-84% oxygen saturation.    Review of Systems Symptom of dyspnea at baseline and continues to improve with tramadol as needed. Objective:   Vital Signs:  BP 107/61 (BP Location: Right Arm)   Pulse 83   Temp (!) 97.4 F (36.3 C) (Oral)   Resp (!) 22   Ht 5\' 5"  (1.651 m)   Wt 62.5 kg   SpO2 96%   BMI 22.93 kg/m   Physical Exam: General: NAD, alert, pleasant, laying in bed Eyes: No discharge noted HENT: normocephalic, atraumatic, moist mucous membranes Cardiovascular: RRR, no edema in LE b/l Respiratory: no increased work of breathing noted, not in respiratory distress, remains on HFNC [place] with support Abdomen: not distended Extremities: Purposeful movements noted in all extremities Skin: no rashes or lesions on visible skin Neuro: A&Ox4, following commands easily Psych: appropriately answers all questions  Imaging: I personally reviewed recent imaging.   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  Patient being managed by hospitalist as well as PCCM.  Seeding management for acute on chronic respiratory failure, likely multifactorial.  CT imaging negative for PE however showed severe chronic lung disease with diffuse interstitial thickening, patchy airspace opacities which are progressed compared to prior CT scan from August; patient's oxygen requirements have worsened.  Palliative care consulted to assist with complex medical decision making  and symptom management.   Recommendations/Plan: # Complex medical decision making/goals of care:  - Patient plans to return to her home with hospice support when determined appropriate for discharge.  Patient is from [place].  Recommend discharge with hospice, hospice already involved.  -Daughter has information regarding arranging for out of pocket care to help at [facility].                   -Patient already has ACP documentation on file from 2018.  She confirmed what the document states such as her healthcare power of attorney if she was unable to speak for herself would be her husband, Shanielle Correll.  Her primary alternate would be her daughter [name] and her secondary alternate would be her daughter [name].    -  Code Status: DNR    # Symptom management:      Dyspnea                                    -  Continue tramadol frequency to 50 mg every 4 hours as needed. Please make sure has medication at discharge to continue taking.   # Psycho-social/Spiritual Support:  - Support System: Husband, 2 daughters, 2 stepdaughters  # Discharge Planning: home with hosp[facility] at [facility] facility.  Discussed with: patient, family  Thank you for allowing the palliative medicine team to participate in the care [name]. PMT will now  sign off as patient's goals for medical care are determined. Should PMT be needed in the future, please reach out to reconsult. Thank you.  Moderate MDM Loistine Chance MD Palliative Care Provider PMT # [phone]  If patient remains symptomatic despite maximum doses, please call PMT at [phone] between 0700 and 1900. Outside of these hours, please call attending, as PMT does not have night coverage.  This provider spent a total of 37 minutes providing patient's care.  Includes review of EMR, discussing care with other staff members involved in patient's medical care, obtaining relevant history and information from patient and/or patient's family, and personal review of imaging and lab work. Greater than 50% of the time was spent counseling and coordinating care related to the above assessment and plan.

## 2022-06-07 ENCOUNTER — Telehealth: Payer: Self-pay | Admitting: Family Medicine

## 2022-06-07 DIAGNOSIS — R0689 Other abnormalities of breathing: Secondary | ICD-10-CM | POA: Diagnosis not present

## 2022-06-07 DIAGNOSIS — R0602 Shortness of breath: Secondary | ICD-10-CM | POA: Diagnosis not present

## 2022-06-07 DIAGNOSIS — R531 Weakness: Secondary | ICD-10-CM | POA: Diagnosis not present

## 2022-06-07 DIAGNOSIS — J9621 Acute and chronic respiratory failure with hypoxia: Secondary | ICD-10-CM | POA: Diagnosis not present

## 2022-06-07 DIAGNOSIS — R058 Other specified cough: Secondary | ICD-10-CM

## 2022-06-07 DIAGNOSIS — Z515 Encounter for palliative care: Secondary | ICD-10-CM | POA: Diagnosis not present

## 2022-06-07 DIAGNOSIS — Z170 Estrogen receptor positive status [ER+]: Secondary | ICD-10-CM | POA: Diagnosis not present

## 2022-06-07 DIAGNOSIS — Z7189 Other specified counseling: Secondary | ICD-10-CM | POA: Diagnosis not present

## 2022-06-07 DIAGNOSIS — C50412 Malignant neoplasm of upper-outer quadrant of left female breast: Secondary | ICD-10-CM | POA: Diagnosis not present

## 2022-06-07 DIAGNOSIS — Z743 Need for continuous supervision: Secondary | ICD-10-CM | POA: Diagnosis not present

## 2022-06-07 LAB — CBC WITH DIFFERENTIAL/PLATELET
Abs Immature Granulocytes: 0.27 10*3/uL — ABNORMAL HIGH (ref 0.00–0.07)
Basophils Absolute: 0 10*3/uL (ref 0.0–0.1)
Basophils Relative: 0 %
Eosinophils Absolute: 0 10*3/uL (ref 0.0–0.5)
Eosinophils Relative: 0 %
HCT: 26.1 % — ABNORMAL LOW (ref 36.0–46.0)
Hemoglobin: 8.3 g/dL — ABNORMAL LOW (ref 12.0–15.0)
Immature Granulocytes: 2 %
Lymphocytes Relative: 4 %
Lymphs Abs: 0.5 10*3/uL — ABNORMAL LOW (ref 0.7–4.0)
MCH: 34.6 pg — ABNORMAL HIGH (ref 26.0–34.0)
MCHC: 31.8 g/dL (ref 30.0–36.0)
MCV: 108.8 fL — ABNORMAL HIGH (ref 80.0–100.0)
Monocytes Absolute: 1.4 10*3/uL — ABNORMAL HIGH (ref 0.1–1.0)
Monocytes Relative: 11 %
Neutro Abs: 10 10*3/uL — ABNORMAL HIGH (ref 1.7–7.7)
Neutrophils Relative %: 83 %
Platelets: 145 10*3/uL — ABNORMAL LOW (ref 150–400)
RBC: 2.4 MIL/uL — ABNORMAL LOW (ref 3.87–5.11)
RDW: 15 % (ref 11.5–15.5)
WBC: 12.2 10*3/uL — ABNORMAL HIGH (ref 4.0–10.5)
nRBC: 0 % (ref 0.0–0.2)

## 2022-06-07 LAB — BASIC METABOLIC PANEL
Anion gap: 7 (ref 5–15)
BUN: 25 mg/dL — ABNORMAL HIGH (ref 8–23)
CO2: 43 mmol/L — ABNORMAL HIGH (ref 22–32)
Calcium: 9 mg/dL (ref 8.9–10.3)
Chloride: 91 mmol/L — ABNORMAL LOW (ref 98–111)
Creatinine, Ser: 0.67 mg/dL (ref 0.44–1.00)
GFR, Estimated: >60 mL/min (ref >60)
Glucose, Bld: 95 mg/dL (ref 70–99)
Potassium: 2.9 mmol/L — ABNORMAL LOW (ref 3.5–5.1)
Sodium: 141 mmol/L (ref 135–145)

## 2022-06-07 MED ORDER — TRAMADOL HCL 50 MG PO TABS: 50.0000 mg | ORAL_TABLET | ORAL | 0 refills | Status: AC | PRN

## 2022-06-07 MED ORDER — FUROSEMIDE 40 MG PO TABS: 40.0000 mg | ORAL_TABLET | Freq: Two times a day (BID) | ORAL | 0 refills | Status: AC

## 2022-06-07 MED ORDER — POTASSIUM CHLORIDE CRYS ER 20 MEQ PO TBCR
40.0000 meq | EXTENDED_RELEASE_TABLET | ORAL | Status: DC
  Administered 2022-06-07: 40 meq via ORAL
  Filled 2022-06-07 (×2): qty 2

## 2022-06-07 MED ORDER — BENZONATATE 200 MG PO CAPS: 200.0000 mg | ORAL_CAPSULE | Freq: Three times a day (TID) | ORAL | 0 refills | Status: AC | PRN

## 2022-06-07 MED ORDER — POTASSIUM CHLORIDE CRYS ER 20 MEQ PO TBCR: 20.0000 meq | EXTENDED_RELEASE_TABLET | ORAL | Status: DC

## 2022-06-07 MED ORDER — PREDNISONE 10 MG PO TABS: ORAL_TABLET | ORAL | 0 refills | Status: AC

## 2022-06-07 NOTE — Plan of Care (Signed)

## 2022-06-07 NOTE — Care Management Important Message (Signed)
Important Message  Patient Details Hospice Services. Name: Sheila Shields MRN: [id number] Date of Birth: [birth year]   Medicare Important Message Given:  No     Kerin Salen 06/07/2022, 12:44 PM

## 2022-06-07 NOTE — Progress Notes (Signed)
Daily Progress Note   Patient Name: Sheila Shields       Date: 06/07/2022 DOB: January 07, 1935  Age: 87 y.o. MRN#: [id number] Attending Physician: Donne Hazel, MD Primary Care Physician: Marin Olp, MD Admit Date: 05/29/2022 Length of Stay: 9 days  Reason for Consultation/Follow-up: Establishing goals of care and symptom management  Subjective:   CC: Patient laying in bed denying any increased in breathing effort at this time.  Following up regarding complex medical decision making and symptom management.  Subjective:  Chart reviewed, patient seen and examined, discussed with daughter and spouse present at bedside, patient is eager for d/c today.     Review of Systems Symptom of dyspnea at baseline and continues to improve with tramadol as needed. Objective:   Vital Signs:  BP 132/61 (BP Location: Right Arm)   Pulse 74   Temp 97.8 F (36.6 C) (Oral)   Resp 18   Ht 5\' 5"  (1.651 m)   Wt 64.6 kg   SpO2 93%   BMI 23.70 kg/m   Physical Exam: General: NAD, alert, pleasant, laying in bed Eyes: No discharge noted HENT: normocephalic, atraumatic, moist mucous membranes Cardiovascular: RRR, no edema in LE b/l Respiratory: no increased work of breathing noted, not in respiratory distress, remains on HFNC [place] with support Abdomen: not distended Extremities: Purposeful movements noted in all extremities Skin: no rashes or lesions on visible skin Neuro: A&Ox4, following commands easily Psych: appropriately answers all questions  Imaging: I personally reviewed recent imaging.   Assessment & Plan:   Assessment: Patient is an 88 year old female with past medical history of COPD, chronic hypoxic respiratory failure on 4 L nasal cannula baseline, stage IV adenocarcinoma of left lung with mets to bone (diagnosed 11/2021), brain aneurysm, and history of breast cancer status post left lumpectomy who was admitted on 05/29/2022 for management of worsening shortness of breath, cough,  and wheezing.  Patient being managed by hospitalist as well as PCCM.  Seeding management for acute on chronic respiratory failure, likely multifactorial.  CT imaging negative for PE however showed severe chronic lung disease with diffuse interstitial thickening, patchy airspace opacities which are progressed compared to prior CT scan from August; patient's oxygen requirements have worsened.  Palliative care consulted to assist with complex medical decision making  and symptom management.   Recommendations/Plan: # Complex medical decision making/goals of care:  - Patient plans to return to her home with hospice support when determined appropriate for discharge.  Patient is from [place].  Recommend discharge with hospice, hospice already involved.  -Daughter has information regarding arranging for out of pocket care to help at [facility].                   -Patient already has ACP documentation on file from 2018.  She confirmed what the document states such as her healthcare power of attorney if she was unable to speak for herself would be her husband, Jewelene Mairena.  Her primary alternate would be her daughter [name] and her secondary alternate would be her daughter [name].    -  Code Status: DNR    # Symptom management:      Dyspnea                                    -Continue tramadol frequency to 50 mg every 4 hours as needed. Please make sure has medication  at discharge to continue taking.   # Psycho-social/Spiritual Support:  - Support System: Husband, 2 daughters, 2 stepdaughters  # Discharge Planning: home with hospice at [facility] facility.  Discussed with: patient, family  Thank you for allowing the palliative medicine team to participate in the care [name]. PMT will now sign off as patient's goals for medical care are determined. Should PMT be needed in the future, please reach out to reconsult. Thank you.  [place] MD Palliative Care  Provider PMT # [phone]  If patient remains symptomatic despite maximum doses, please call PMT at [phone] between 0700 and 1900. Outside of these hours, please call attending, as PMT does not have night coverage.  This provider spent a total of 37 minutes providing patient's care.  Includes review of EMR, discussing care with other staff members involved in patient's medical care, obtaining relevant history and information from patient and/or patient's family, and personal review of imaging and lab work. Greater than 50% of the time was spent counseling and coordinating care related to the above assessment and plan.

## 2022-06-07 NOTE — Telephone Encounter (Signed)
Returned call to [employer] and spoke with Judeen Hammans; advised provider agreement in being hospice attending and also being ok with hospice providers directing her care at this time. Judeen Hammans verbalized understanding and was documenting for patient.

## 2022-06-07 NOTE — TOC Transition Note (Addendum)
Transition of Care [facility]) - CM/SW Discharge Note   Patient Details  Name: Sheila Shields MRN: [id number] Date of Birth: [birth year]  [facility]) CM/SW Contact:  Dessa Phi, RN Phone Number: 06/07/2022, 10:54 AM   Clinical Narrative:  Patient for d/c to Home ([facility])[address] [place] [phone]. [facility] home w/hospice services rep Judson Roch already aware & following-all dme has been addressed by [facility]. PTAR,DNR,02 once ready for d/c will call. No further CM needs.  -PTAR called.    Final next level of care: Home w Hospice Care Barriers to Discharge: No Barriers Identified   Patient Goals and CMS Choice Patient states their goals for this hospitalization and ongoing recovery are::  (Home) CMS Medicare.gov Compare Post Acute Care list provided to:: Patient Choice offered to / list presented to : Patient  Discharge Placement                Patient to be transferred to facility by:  Corey Harold) Name of family member notified:  (Edward(spouse)) Patient and family notified of of transfer: 06/07/22  Discharge Plan and Services   Discharge Planning Services: CM Consult Post Acute Care Choice: Durable Medical Equipment, Home Health (Active home 02,rw,cane;HHPT-through [facility])                               Social Determinants of Health (SDOH) Interventions     Readmission Risk Interventions    05/30/2022   10:51 AM 04/18/2022   10:49 AM  Readmission Risk Prevention Plan  Transportation Screening Complete Complete  PCP or Specialist Appt within 3-5 Days Complete Complete  HRI or [place] Complete Complete  Social Work Consult for [place] Planning/Counseling Complete Complete  Palliative Care Screening Complete Complete  Medication Review [employer]) Complete Complete

## 2022-06-07 NOTE — Discharge Summary (Addendum)
Physician Discharge Summary   Patient: Sheila Shields MRN: [id number] DOB: 1934/11/19  Admit date:     05/29/2022  Discharge date: 06/07/22  Discharge Physician: Marylu Lund   PCP: Marin Olp, MD   Recommendations at discharge:    Follow up with Hospice services Continue on supplemental O2 as needed for patient comfort and to keep sats >88%  Discharge Diagnoses: Principal Problem:   Acute on chronic respiratory failure with hypoxia ([place]) Active Problems:   Hyperlipemia   Essential hypertension   Gastroesophageal reflux disease   Adenocarcinoma of right lung, stage 4 (HCC)   COPD with acute exacerbation (HCC)   Acute on chronic hypoxic respiratory failure (HCC)   Malnutrition of moderate degree   COPD exacerbation (HCC)   Goals of care, counseling/discussion   Palliative care encounter   Coordination of complex care   High risk medication use   DNR (do not resuscitate)   Dyspnea  Resolved Problems:   * No resolved hospital problems. *  Hospital Course: 87 y.o. female with PMH significant for stage IV adenocarcinoma of the lungs, history of left breast cancer, HTN, HLD, COPD on 4-5L oxygen at home, GERD, anxiety, osteoarthritis 11/20, patient presented to the ED from home with complaint of 4 days of worsening shortness of breath, wheezing, weakness, cough, cyanosis. EMS noted her O2 sat at 44% on 5 L.  Give her albuterol, Atrovent, IV Solu-Medrol.   In the ED, patient was afebrile, heart rate 113, blood pressure 91/57, respiratory in 20s and low 30s, required nonrebreather mask in the ED. Blood gas with pH 7.45, PCO2 44, bicarb 31, BNP 500, WBC count 7.8, hemoglobin 8.7 Respiratory virus panel negative for flu, COVID CT angio chest did not show any evidence of acute pulm embolism.  Showed severe chronic lung disease with diffuse interstitial thickening, patchy airspace opacities which have progressed compared to prior CT scan from August. Patient was given  bronchodilators, IV Solu-Medrol, empiric IV antibiotics  Assessment and Plan: #1 acute on chronic respiratory failure with hypoxemia/acute exacerbation of COPD -Patient noted chronically on home O2, 4 to 5 L oxygen in the setting of severe COPD and stage IV adenocarcinoma of the lung. -Patient presented to the ED with worsening shortness of breath requiring nonrebreather in the ED, imaging showed severe chronic lung disease diffuse interstitial thickening, patchy airspace opacities unclear of acute component. -CT chest done negative for PE however showed severe chronic lung disease with diffuse interstitial thickening, patchy airspace opacities which are progressed compared to prior CT scan from August. -Patient later with worsening O2 requirements currently up to 15 L high flow nasal cannula with nonrebreather no significant improvement despite IV diuretics and IV steroids. -Urine Legionella antigen negative, urine pneumococcus antigen negative. -Completed course of abx -Per Dr. Pietro Cassis goal O2 will be close to 85% as patient seems fairly comfortable with low O2 saturations. -Patient noted however with worsening progressive shortness of breath on minimal exertion. -VBG on admission with a pH of 7.45, pO2 of 53, bicarb of 30.6, O2 sats of 85.1%. -BNP noted elevated at 500.5 on admission, with repeat BNP of 637.6. -Pt given lasix this course. -PCCM recommending changing CODE STATUS to DNR, weaning O2 to keep sats 88%, -Palliative care following. -Per PCCM, plan to continue extended prednisone taper until pt is followed up by PCCM as outpatient   2.  Stage IV lung cancer -Being followed by Dr. Curt Bears in the outpatient setting. -Oncology informed of admission via epic. -Palliative care consulted  and following. -Back to facility with hospice following.   -Outpatient follow-up.   3.  Hyperlipidemia -Statin.   4.  Hypertension -Patient noted not on antihypertensive medications prior to  admission. -BP stable. -Monitor BP with diuresis.   5.  GERD -PPI   6.  History of breast cancer -Tamoxifen   7.  Chronic macrocytic anemia -Hemoglobin currently stable at 8.0. -Continue vitamin S34, folic acid supplementation.       Consultants: Palliative Care, PCCM Procedures performed:  CT angiogram chest 05/29/2022 Chest x-ray 05/29/2022 2D echo 05/31/2022  Disposition:  Hospice Diet recommendation:  Regular diet DISCHARGE MEDICATION: Allergies as of 06/07/2022       Reactions   Alcohol Other (See Comments)   "Recovering Alcoholic*   Cheese Nausea And Vomiting   Dilaudid [hydromorphone Hcl] Other (See Comments)   Pt had hypotension after dilaudid 1mg  IV while in the OR, required temporary pressor intervention.   Phenytoin Sodium Extended Other (See Comments)   Reaction not cited   Diclofenac Sodium Rash   Patient does not recall this as an allergy.         Medication List     TAKE these medications    albuterol 108 (90 Base) MCG/ACT inhaler Commonly known as: ProAir HFA INHALE 2 PUFFS EVERY 4 HOURS AS NEEDED FOR COUGHING SPELLS What changed:  how much to take how to take this when to take this reasons to take this additional instructions   aspirin 325 MG tablet Take 1 tablet (325 mg total) by mouth daily.   benzonatate 200 MG capsule Commonly known as: TESSALON Take 1 capsule (200 mg total) by mouth 3 (three) times daily as needed for cough. What changed:  when to take this reasons to take this additional instructions   Bevespi Aerosphere 9-4.8 MCG/ACT Aero Generic drug: Glycopyrrolate-Formoterol Inhale 2 puffs into the lungs 2 (two) times daily.   Breztri Aerosphere 160-9-4.8 MCG/ACT Aero Generic drug: Budeson-Glycopyrrol-Formoterol Inhale 2 puffs into the lungs in the morning and at bedtime.   clopidogrel 75 MG tablet Commonly known as: PLAVIX Take 0.5 tablets (37.5 mg total) by mouth daily.   Coenzyme Q10 300 MG Caps Take 300 mg  by mouth daily.   cyanocobalamin 1000 MCG tablet Commonly known as: VITAMIN B12 Take 1,000 mcg by mouth daily.   fexofenadine 180 MG tablet Commonly known as: ALLEGRA Take 180 mg by mouth daily.   folic acid 1 MG tablet Commonly known as: FOLVITE TAKE 1 TABLET(1 MG) BY MOUTH DAILY What changed: See the new instructions.   furosemide 40 MG tablet Commonly known as: Lasix Take 1 tablet (40 mg total) by mouth 2 (two) times daily.   guaiFENesin 600 MG 12 hr tablet Commonly known as: MUCINEX Take 600 mg by mouth daily.   lactose free nutrition Liqd Take 237 mLs by mouth daily.   montelukast 10 MG tablet Commonly known as: SINGULAIR TAKE 1 TABLET(10 MG) BY MOUTH AT BEDTIME What changed: See the new instructions.   multivitamin with minerals Tabs tablet Take 1 tablet by mouth daily with breakfast.   pantoprazole 40 MG tablet Commonly known as: PROTONIX TAKE 1 TABLET(40 MG) BY MOUTH DAILY What changed: See the new instructions.   potassium chloride SA 20 MEQ tablet Commonly known as: KLOR-CON M Take 20 mEq by mouth every other day.   predniSONE 10 MG tablet Commonly known as: DELTASONE Take 6 tablets (60 mg total) by mouth daily with breakfast for 7 days, THEN 5 tablets (50 mg total)  daily with breakfast for 7 days, THEN 4 tablets (40 mg total) daily with breakfast for 7 days, THEN 3 tablets (30 mg total) daily with breakfast for 7 days, THEN 2 tablets (20 mg total) daily with breakfast for 7 days, THEN 1 tablet (10 mg total) daily with breakfast for 7 days. Start taking on: June 07, 2022   promethazine-dextromethorphan 6.25-15 MG/5ML syrup Commonly known as: PROMETHAZINE-DM Take 5 mLs by mouth 4 (four) times daily as needed for cough.   Pulmicort Flexhaler 180 MCG/ACT inhaler Generic drug: budesonide Inhale 1 puff into the lungs 2 (two) times daily.   rosuvastatin 10 MG tablet Commonly known as: CRESTOR Take 1 tablet (10 mg total) by mouth daily.   tamoxifen  20 MG tablet Commonly known as: NOLVADEX Take 1 tablet (20 mg total) by mouth daily.   traMADol 50 MG tablet Commonly known as: ULTRAM Take 1 tablet (50 mg total) by mouth every 4 (four) hours as needed for moderate pain or severe pain (dyspnea). What changed:  how much to take how to take this when to take this reasons to take this additional instructions   traZODone 50 MG tablet Commonly known as: DESYREL TAKE 1/2- 1 TABLET BY MOUTH EVERY NIGHT AT BEDTIME AS NEEDED SLEEP What changed:  how much to take how to take this when to take this additional instructions   Tylenol 8 Hour Arthritis Pain 650 MG CR tablet Generic drug: acetaminophen Take 1,300 mg by mouth in the morning and at bedtime.   Vitamin D3 50 MCG (2000 UT) Tabs Take 2,000 Units by mouth in the morning.        Discharge Exam: Filed Weights   06/05/22 0500 06/06/22 0500 06/07/22 0500  Weight: 62 kg 62.5 kg 64.6 kg   General exam: Awake, laying in bed, in nad Respiratory system: Normal respiratory effort, no audible wheezing Cardiovascular system: regular rate, s1, s2 Gastrointestinal system: Soft, nondistended, positive BS Central nervous system: CN2-12 grossly intact, strength intact Extremities: Perfused, no clubbing Skin: Normal skin turgor, no notable skin lesions seen Psychiatry: Mood normal // no visual hallucinations   Condition at discharge: fair  The results of significant diagnostics from this hospitalization (including imaging, microbiology, ancillary and laboratory) are listed below for reference.   Imaging Studies: DG CHEST PORT 1 VIEW  Result Date: 06/03/2022 CLINICAL DATA:  Worsening hypoxia. EXAM: PORTABLE CHEST 1 VIEW COMPARISON:  May 29, 2022 chest x-ray. May 29, 2022 CT scan of the chest. FINDINGS: The cardiomediastinal silhouette is stable. No pneumothorax. Diffuse opacities throughout the lungs are stable with a chronic interstitial component. More focal infiltrate in  the left mid lower lung is stable in the interval as well. No other interval changes. IMPRESSION: No interval changes in diffuse interstitial opacities and more focal infiltrate in the left mid and lower lung. Electronically Signed   By: Dorise Bullion III M.D.   On: 06/03/2022 12:04   ECHOCARDIOGRAM COMPLETE  Result Date: 05/31/2022    ECHOCARDIOGRAM REPORT   Patient Name:   SAMONA CHIHUAHUA Date of Exam: 05/31/2022 Medical Rec #:  [id number]     Height:       65.0 in Accession #:    [id number]    Weight:       158.3 lb Date of Birth:  [birth year]     BSA:          1.791 m Patient Age:    87 years      BP:  156/73 mmHg Patient Gender: F             HR:           82 bpm. Exam Location:  Inpatient Procedure: 2D Echo Indications:    pulmonary hypertension  History:        Patient has prior history of Echocardiogram examinations, most                 recent 06/29/2020. COPD; Risk Factors:Dyslipidemia and                 Hypertension.  Sonographer:    Harvie Junior Referring Phys: Jefferson Davis  1. Left ventricular ejection fraction, by estimation, is 60 to 65%. The left ventricle has normal function. The left ventricle has no regional wall motion abnormalities. Left ventricular diastolic parameters were normal. There is the interventricular septum is flattened in systole, consistent with right ventricular pressure overload.  2. Right ventricular systolic function is normal. The right ventricular size is moderately enlarged. There is normal pulmonary artery systolic pressure.  3. The mitral valve is grossly normal. No evidence of mitral valve regurgitation. No evidence of mitral stenosis.  4. Tricuspid valve regurgitation is mild to moderate.  5. The aortic valve is abnormal. Aortic valve regurgitation is not visualized. Mild aortic valve stenosis. Aortic valve mean gradient measures 14.0 mmHg. Normal LV Stroke volume and DVI.  6. The inferior vena cava is normal in size with greater than  50% respiratory variability, suggesting right atrial pressure of 3 mmHg. Comparison(s): RV is dilated from prior. FINDINGS  Left Ventricle: Left ventricular ejection fraction, by estimation, is 60 to 65%. The left ventricle has normal function. The left ventricle has no regional wall motion abnormalities. The left ventricular internal cavity size was small. There is no left ventricular hypertrophy. The interventricular septum is flattened in systole, consistent with right ventricular pressure overload. Left ventricular diastolic parameters were normal. Right Ventricle: The right ventricular size is moderately enlarged. No increase in right ventricular wall thickness. Right ventricular systolic function is normal. There is normal pulmonary artery systolic pressure. The tricuspid regurgitant velocity is 2.78 m/s, and with an assumed right atrial pressure of 3 mmHg, the estimated right ventricular systolic pressure is 89.2 mmHg. Left Atrium: Left atrial size was normal in size. Right Atrium: Right atrial size was normal in size. Pericardium: There is no evidence of pericardial effusion. Mitral Valve: The mitral valve is grossly normal. No evidence of mitral valve regurgitation. No evidence of mitral valve stenosis. Tricuspid Valve: The tricuspid valve is normal in structure. Tricuspid valve regurgitation is mild to moderate. No evidence of tricuspid stenosis. Aortic Valve: The aortic valve is abnormal. Aortic valve regurgitation is not visualized. Mild aortic stenosis is present. Aortic valve mean gradient measures 14.0 mmHg. Aortic valve peak gradient measures 24.4 mmHg. Aortic valve area, by VTI measures 2.10 cm. Pulmonic Valve: The pulmonic valve was not well visualized. Pulmonic valve regurgitation is not visualized. No evidence of pulmonic stenosis. Aorta: The aortic root is normal in size and structure. Venous: The inferior vena cava is normal in size with greater than 50% respiratory variability, suggesting  right atrial pressure of 3 mmHg. IAS/Shunts: No atrial level shunt detected by color flow Doppler.  LEFT VENTRICLE PLAX 2D LVIDd:         4.00 cm     Diastology LVIDs:         2.60 cm     LV e' medial:  10.70 cm/s LV PW:         0.90 cm     LV E/e' medial:  7.3 LV IVS:        0.90 cm     LV e' lateral:   6.42 cm/s LVOT diam:     1.90 cm     LV E/e' lateral: 12.1 LV SV:         97 LV SV Index:   54 LVOT Area:     2.84 cm  LV Volumes (MOD) LV vol d, MOD A2C: 84.4 ml LV vol d, MOD A4C: 79.2 ml LV vol s, MOD A2C: 29.4 ml LV vol s, MOD A4C: 31.8 ml LV SV MOD A2C:     55.0 ml LV SV MOD A4C:     79.2 ml LV SV MOD BP:      51.7 ml RIGHT VENTRICLE RV Basal diam:  3.90 cm RV Mid diam:    4.40 cm TAPSE (M-mode): 1.6 cm LEFT ATRIUM             Index        RIGHT ATRIUM           Index LA diam:        2.60 cm 1.45 cm/m   RA Area:     12.00 cm LA Vol (A2C):   34.8 ml 19.43 ml/m  RA Volume:   29.40 ml  16.42 ml/m LA Vol (A4C):   37.7 ml 21.05 ml/m LA Biplane Vol: 38.0 ml 21.22 ml/m  AORTIC VALVE                     PULMONIC VALVE AV Area (Vmax):    1.88 cm      PV Vmax:          1.18 m/s AV Area (Vmean):   1.74 cm      PV Peak grad:     5.6 mmHg AV Area (VTI):     2.10 cm      PR End Diast Vel: 14.29 msec AV Vmax:           247.00 cm/s AV Vmean:          166.000 cm/s AV VTI:            0.464 m AV Peak Grad:      24.4 mmHg AV Mean Grad:      14.0 mmHg LVOT Vmax:         164.05 cm/s LVOT Vmean:        102.050 cm/s LVOT VTI:          0.343 m LVOT/AV VTI ratio: 0.74  AORTA Ao Root diam: 3.00 cm MITRAL VALVE                TRICUSPID VALVE MV Area (PHT): 3.48 cm     TR Peak grad:   30.9 mmHg MV Decel Time: 218 msec     TR Vmax:        278.00 cm/s MV E velocity: 77.60 cm/s MV A velocity: 112.00 cm/s  SHUNTS MV E/A ratio:  0.69         Systemic VTI:  0.34 m                             Systemic Diam: 1.90 cm Rudean Haskell MD Electronically signed by Rudean Haskell MD Signature Date/Time: 05/31/2022/2:30:07 PM     Final  CT Angio Chest PE W and/or Wo Contrast  Result Date: 05/29/2022 CLINICAL DATA:  Cough for 4 days. History of respiratory failure, COPD and lung cancer. Increasing weakness. EXAM: CT ANGIOGRAPHY CHEST WITH CONTRAST TECHNIQUE: Multidetector CT imaging of the chest was performed using the standard protocol during bolus administration of intravenous contrast. Multiplanar CT image reconstructions and MIPs were obtained to evaluate the vascular anatomy. RADIATION DOSE REDUCTION: This exam was performed according to the departmental dose-optimization program which includes automated exposure control, adjustment of the mA and/or kV according to patient size and/or use of iterative reconstruction technique. CONTRAST:  55mL OMNIPAQUE IOHEXOL 350 MG/ML SOLN COMPARISON:  Chest CT 04/17/2022 and 02/27/2022. FINDINGS: Cardiovascular: Study is mildly degraded by breathing artifact. The pulmonary arteries are well opacified with contrast to the level of the subsegmental branches. There is no evidence of acute pulmonary embolism. There is limited enhancement of the systemic vessels which demonstrate extensive atherosclerosis. No acute systemic arterial abnormalities are identified. The heart size is normal. There is no pericardial effusion. Mediastinum/Nodes: There are no enlarged mediastinal, hilar or axillary lymph nodes.Unchanged appearance of partially calcified right thyroid nodule. Although previously hypermetabolic on PET-CT, given comorbidities, additional evaluation not likely warranted. Lungs/Pleura: Small left pleural effusion. Severe chronic lung disease with centrilobular and paraseptal emphysema, diffuse interstitial thickening and patchy airspace opacities which have progressed compared with the prior CT. Upper abdomen: The visualized upper abdomen appears stable without significant findings. Previously characterized hepatic cysts are grossly unchanged. Musculoskeletal/Chest wall: No acute osseous  findings. Sclerotic metastasis within the T3 vertebral body and multilevel thoracolumbar spondylosis again noted. Review of the MIP images confirms the above findings. IMPRESSION: 1. No evidence of acute pulmonary embolism or other acute vascular findings in the chest. 2. Severe chronic lung disease with diffuse interstitial thickening and patchy airspace opacities which have progressed compared with the prior CT. Findings may reflect superimposed edema or atypical infection. Stable small left pleural effusion. 3. Aortic Atherosclerosis (ICD10-I70.0) and Emphysema (ICD10-J43.9). Electronically Signed   By: Richardean Sale M.D.   On: 05/29/2022 18:20   DG Chest Portable 1 View  Result Date: 05/29/2022 CLINICAL DATA:  Shortness of breath. EXAM: PORTABLE CHEST 1 VIEW COMPARISON:  04/15/2022 FINDINGS: Low lung volumes. The diffuse peripherally predominant interstitial disease is similar in the interval. Slight improvement in aeration at the left base since prior study. The cardiopericardial silhouette is within normal limits for size. The visualized bony structures of the thorax are unremarkable. Telemetry leads overlie the chest. IMPRESSION: Similar appearance of the diffuse peripherally predominant interstitial disease with slight improvement in aeration at the left base. Electronically Signed   By: Misty Stanley M.D.   On: 05/29/2022 12:52    Microbiology: Results for orders placed or performed during the hospital encounter of 05/29/22  Resp Panel by RT-PCR (Flu A&B, Covid) Anterior Nasal Swab     Status: None   Collection Time: 05/29/22 12:28 PM   Specimen: Anterior Nasal Swab  Result Value Ref Range Status   SARS Coronavirus 2 by RT PCR NEGATIVE NEGATIVE Final    Comment: (NOTE) SARS-CoV-2 target nucleic acids are NOT DETECTED.  The SARS-CoV-2 RNA is generally detectable in upper respiratory specimens during the acute phase of infection. The lowest concentration of SARS-CoV-2 viral copies this  assay can detect is 138 copies/mL. A negative result does not preclude SARS-Cov-2 infection and should not be used as the sole basis for treatment or other patient management decisions. A negative result may occur with  improper  specimen collection/handling, submission of specimen other than nasopharyngeal swab, presence of viral mutation(s) within the areas targeted by this assay, and inadequate number of viral copies(<138 copies/mL). A negative result must be combined with clinical observations, patient history, and epidemiological information. The expected result is Negative.  Fact Sheet for Patients:  EntrepreneurPulse.com.au  Fact Sheet for Healthcare Providers:  IncredibleEmployment.be  This test is no t yet approved or cleared by the [place] FDA and  has been authorized for detection and/or diagnosis of SARS-CoV-2 by FDA under an Emergency Use Authorization (EUA). This EUA will remain  in effect (meaning this test can be used) for the duration of the COVID-19 declaration under Section 564(b)(1) of the Act, 21 U.S.C.section 360bbb-3(b)(1), unless the authorization is terminated  or revoked sooner.       Influenza A by PCR NEGATIVE NEGATIVE Final   Influenza B by PCR NEGATIVE NEGATIVE Final    Comment: (NOTE) The Xpert Xpress SARS-CoV-2/FLU/RSV plus assay is intended as an aid in the diagnosis of influenza from Nasopharyngeal swab specimens and should not be used as a sole basis for treatment. Nasal washings and aspirates are unacceptable for Xpert Xpress SARS-CoV-2/FLU/RSV testing.  Fact Sheet for Patients: EntrepreneurPulse.com.au  Fact Sheet for Healthcare Providers: IncredibleEmployment.be  This test is not yet approved or cleared by the [place] FDA and has been authorized for detection and/or diagnosis of SARS-CoV-2 by FDA under an Emergency Use Authorization (EUA). This EUA will  remain in effect (meaning this test can be used) for the duration of the COVID-19 declaration under Section 564(b)(1) of the Act, 21 U.S.C. section 360bbb-3(b)(1), unless the authorization is terminated or revoked.  Performed at [facility], [place] [address] [place], [place] [address]   Respiratory (~20 pathogens) panel by PCR     Status: None   Collection Time: 05/31/22 12:05 PM   Specimen: Nasopharyngeal Swab; Respiratory  Result Value Ref Range Status   Adenovirus NOT DETECTED NOT DETECTED Final   Coronavirus 229E NOT DETECTED NOT DETECTED Final    Comment: (NOTE) The Coronavirus on the Respiratory Panel, DOES NOT test for the novel  Coronavirus (2019 nCoV)    Coronavirus HKU1 NOT DETECTED NOT DETECTED Final   Coronavirus NL63 NOT DETECTED NOT DETECTED Final   Coronavirus OC43 NOT DETECTED NOT DETECTED Final   Metapneumovirus NOT DETECTED NOT DETECTED Final   Rhinovirus / Enterovirus NOT DETECTED NOT DETECTED Final   Influenza A NOT DETECTED NOT DETECTED Final   Influenza B NOT DETECTED NOT DETECTED Final   Parainfluenza Virus 1 NOT DETECTED NOT DETECTED Final   Parainfluenza Virus 2 NOT DETECTED NOT DETECTED Final   Parainfluenza Virus 3 NOT DETECTED NOT DETECTED Final   Parainfluenza Virus 4 NOT DETECTED NOT DETECTED Final   Respiratory Syncytial Virus NOT DETECTED NOT DETECTED Final   Bordetella pertussis NOT DETECTED NOT DETECTED Final   Bordetella Parapertussis NOT DETECTED NOT DETECTED Final   Chlamydophila pneumoniae NOT DETECTED NOT DETECTED Final   Mycoplasma pneumoniae NOT DETECTED NOT DETECTED Final    Comment: Performed at [facility], [place]. [address] [place], [place] [address]    Labs: CBC: Recent Labs  Lab 06/01/22 0458 06/02/22 0442 06/03/22 0531 06/04/22 0529 06/07/22 0528  WBC 3.8* 4.4 5.2 7.8 12.2*  NEUTROABS 3.0 3.5 4.1  --  10.0*  HGB 7.9* 8.0* 8.1* 8.0* 8.3*  HCT 24.8* 25.6* 25.9* 25.4* 26.1*  MCV 111.7* 111.3*  109.3* 110.0* 108.8*  PLT 107* 117* 117* 119* 145*  Basic Metabolic Panel: Recent Labs  Lab 06/01/22 0458 06/02/22 0442 06/03/22 0531 06/04/22 0529 06/05/22 0405 06/06/22 0501 06/07/22 0528  NA 142 141 140 143 139 142 141  K 4.2 4.3 4.2 4.1 3.7 3.6 2.9*  CL 106 99 97* 96* 93* 94* 91*  CO2 32 35* 38* 40* 37* 36* 43*  GLUCOSE 152* 162* 157* 108* 108* 113* 95  BUN 19 20 25* 22 24* 24* 25*  CREATININE 0.73 0.69 0.83 0.70 0.59 0.52 0.67  CALCIUM 8.9 8.9 8.9 8.9 8.9 8.9 9.0  MG 1.7 2.0  --   --   --   --   --   PHOS 4.1 4.1  --   --   --   --   --    Liver Function Tests: Recent Labs  Lab 06/01/22 0458 06/02/22 0442  ALBUMIN 2.4* 2.4*   CBG: No results for input(s): "GLUCAP" in the last 168 hours.  Discharge time spent: less than 30 minutes.  Signed: Marylu Lund, MD [facility] 06/07/2022

## 2022-06-07 NOTE — Telephone Encounter (Signed)
I agree to be hospice attending. I am ok with hospice providers directing her care though

## 2022-06-07 NOTE — Telephone Encounter (Signed)
Caller states: - Patient is being assessed for hospice care today  - Patient is requesting PCP become head attending for hospice care   An answer is needed with 48 hours. Amber can be reached @ [phone].

## 2022-06-07 NOTE — Hospital Course (Signed)
87 y.o. female with PMH significant for stage IV adenocarcinoma of the lungs, history of left breast cancer, HTN, HLD, COPD on 4-5L oxygen at home, GERD, anxiety, osteoarthritis 11/20, patient presented to the ED from home with complaint of 4 days of worsening shortness of breath, wheezing, weakness, cough, cyanosis. EMS noted her O2 sat at 44% on 5 L.  Give her albuterol, Atrovent, IV Solu-Medrol.   In the ED, patient was afebrile, heart rate 113, blood pressure 91/57, respiratory in 20s and low 30s, required nonrebreather mask in the ED. Blood gas with pH 7.45, PCO2 44, bicarb 31, BNP 500, WBC count 7.8, hemoglobin 8.7 Respiratory virus panel negative for flu, COVID CT angio chest did not show any evidence of acute pulm embolism.  Showed severe chronic lung disease with diffuse interstitial thickening, patchy airspace opacities which have progressed compared to prior CT scan from August. Patient was given bronchodilators, IV Solu-Medrol, empiric IV antibiotics

## 2022-06-08 NOTE — Telephone Encounter (Signed)
Please see patients response and advise if I can assist any further.

## 2022-06-09 ENCOUNTER — Telehealth: Payer: Self-pay

## 2022-06-09 NOTE — Telephone Encounter (Signed)
Attempted to schedule pt for palliative service at the cancer center, RN noted that pt was D/C'ed with home hospice services. Will cancel ambulatory palliative referral.

## 2022-06-14 ENCOUNTER — Inpatient Hospital Stay: Payer: Medicare HMO | Admitting: Internal Medicine

## 2022-06-14 ENCOUNTER — Inpatient Hospital Stay: Payer: Medicare HMO | Admitting: Physician Assistant

## 2022-06-14 ENCOUNTER — Inpatient Hospital Stay: Payer: Medicare HMO

## 2022-06-15 ENCOUNTER — Ambulatory Visit: Payer: Medicare HMO | Admitting: Emergency Medicine

## 2022-06-16 ENCOUNTER — Telehealth: Payer: Self-pay | Admitting: Family Medicine

## 2022-06-16 NOTE — Telephone Encounter (Signed)
I did death certificate- jeanette or Hasna can you mark as deceased?  I also called LVM for family/condolences

## 2022-06-16 NOTE — Telephone Encounter (Signed)
Fletcher Anon Director at [facility] states that Patient passed away on 06-19-22 and that a Death Certificate request for medical certification was sent to Dr. Yong Channel on the [place]. Case# [id number]

## 2022-06-19 NOTE — Telephone Encounter (Signed)
Pt status has been changed.

## 2022-07-05 ENCOUNTER — Ambulatory Visit: Payer: Medicare HMO | Admitting: Physician Assistant

## 2022-07-05 ENCOUNTER — Ambulatory Visit: Payer: Medicare HMO

## 2022-07-05 ENCOUNTER — Other Ambulatory Visit: Payer: Medicare HMO

## 2022-07-10 DEATH — deceased

## 2022-07-25 ENCOUNTER — Ambulatory Visit: Payer: Medicare HMO | Admitting: Internal Medicine

## 2022-07-25 ENCOUNTER — Other Ambulatory Visit: Payer: Medicare HMO

## 2022-07-25 ENCOUNTER — Ambulatory Visit: Payer: Medicare HMO

## 2022-10-04 ENCOUNTER — Ambulatory Visit: Payer: Medicare HMO | Admitting: Hematology

## 2022-11-23 ENCOUNTER — Ambulatory Visit: Payer: Medicare HMO | Admitting: Family Medicine

## 2023-08-21 IMAGING — PT NM PET TUM IMG INITIAL (PI) SKULL BASE T - THIGH
1 of 7 series · 1 of 25 positions shown · non-contrast
Comparison: Chest CTA on 10/28/2021

CLINICAL DATA: Initial treatment strategy for left lung nodule.
Personal history of left breast carcinoma.

EXAM:
NUCLEAR MEDICINE PET SKULL BASE TO THIGH
TECHNIQUE: 8.1 mCi F-18 FDG was injected intravenously. Full-ring PET imaging
was performed from the skull base to thigh after the radiotracer. CT
data was obtained and used for attenuation correction and anatomic
localization.
Fasting blood glucose: 109 mg/dl

[Series 4: ct sk_thigh 5.0 br38 · axial · 5.0mm · 0.98mm/px · 1 of 220 slices shown]
[im 220/220  brain]
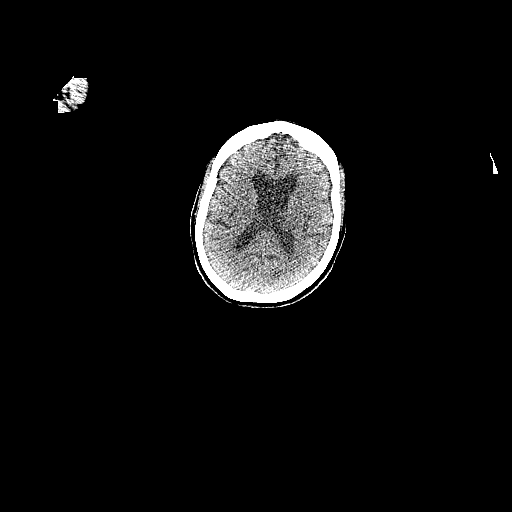

[1 of 25 positions shown; findings below may reference images not displayed]

FINDINGS: Mediastinal blood-pool activity (background): SUV max =

Liver activity (reference): SUV max = N/A

NECK: No hypermetabolic lymph nodes. A right thyroid lobe nodule
with coarse calcification measuring approximately 2 cm shows
hypermetabolic activity, with SUV max of 8.4.

Incidental CT findings:  None.

CHEST: 2.3 cm microlobulated left lower lobe pulmonary nodule shows
intense hypermetabolism, with SUV max of 18.6. A 7 mm pulmonary
nodule in the lateral left upper lobe on image [DATE] is also
hypermetabolic, with SUV max 3.3. Other tiny less than 5 mm
pulmonary nodules are seen bilaterally which also show mild FDG
uptake. Stable small left pleural effusion without focal
hypermetabolic activity.

Mild left hilar lymphadenopathy is hypermetabolic with SUV max of
7.4. Mild mediastinal lymphadenopathy is seen in the pretracheal, AP
window, lateral aortic, and subcarinal regions, with index area in
subcarinal region measuring 1.3 cm short axis with SUV max of 11.9.
A normal size right axillary lymph node is seen on image 59/4 which
shows mild hypermetabolism with SUV max of 3.4.

Fat necrosis is noted in the left breast at site of surgical clips,
which shows mild peripheral FDG uptake.

Incidental CT findings: Moderate to severe centrilobular emphysema.
Aortic and coronary atherosclerotic calcification noted.

ABDOMEN/PELVIS: No abnormal hypermetabolic activity within the
liver, pancreas, adrenal glands, or spleen. No hypermetabolic lymph
nodes in the abdomen or pelvis.

Incidental CT findings: Tiny hepatic cysts show no hypermetabolic
activity. Small calcified gallstone, without evidence of
cholecystitis. Aortic atherosclerotic calcification noted. Colonic
diverticulosis noted, without evidence of diverticulitis.

SKELETON: Multiple hypermetabolic bone metastases are seen T3
vertebral body, right-sided lamina of L3, the right ilium, and right
ischial tuberosity.

Incidental CT findings:  None.
IMPRESSION: Hypermetabolic 2.3 cm left lower lobe pulmonary nodule, with other
sub-cm bilateral pulmonary nodules also showing FDG uptake highly
suspicious for pulmonary metastases.

Mild hypermetabolic left hilar and mediastinal lymphadenopathy,
consistent with metastatic disease.

Single normal size right axillary lymph node is hypermetabolic,
which is indeterminate.

Stable small left pleural effusion, without focal hypermetabolic
activity.

Several hypermetabolic bone metastases in the spine and pelvis.

2 cm hypermetabolic right thyroid lobe nodule. Malignancy cannot be
excluded.

Recommend ultrasound and biopsy of the sonographically detected
nodule.

Reference: [HOSPITAL]. [DATE]): 143-50

Aortic Atherosclerosis (D6VSH-VTL.L) and Emphysema (D6VSH-36L.8).
